# Patient Record
Sex: Female | Born: 1965 | Race: Black or African American | Hispanic: No | Smoking: Current every day smoker
Health system: Southern US, Community
[De-identification: ages and names within clinical notes are randomized; demographics above are authoritative.]

## PROBLEM LIST (undated history)

## (undated) DIAGNOSIS — I639 Cerebral infarction, unspecified: Secondary | ICD-10-CM

## (undated) DIAGNOSIS — E785 Hyperlipidemia, unspecified: Secondary | ICD-10-CM

## (undated) DIAGNOSIS — R112 Nausea with vomiting, unspecified: Secondary | ICD-10-CM

## (undated) DIAGNOSIS — Z720 Tobacco use: Secondary | ICD-10-CM

## (undated) DIAGNOSIS — M47812 Spondylosis without myelopathy or radiculopathy, cervical region: Secondary | ICD-10-CM

## (undated) DIAGNOSIS — Z860100 Personal history of colon polyps, unspecified: Secondary | ICD-10-CM

## (undated) DIAGNOSIS — G629 Polyneuropathy, unspecified: Secondary | ICD-10-CM

## (undated) DIAGNOSIS — E876 Hypokalemia: Secondary | ICD-10-CM

## (undated) DIAGNOSIS — D332 Benign neoplasm of brain, unspecified: Secondary | ICD-10-CM

## (undated) DIAGNOSIS — C259 Malignant neoplasm of pancreas, unspecified: Secondary | ICD-10-CM

## (undated) DIAGNOSIS — M545 Low back pain, unspecified: Secondary | ICD-10-CM

## (undated) DIAGNOSIS — Z9889 Other specified postprocedural states: Secondary | ICD-10-CM

## (undated) DIAGNOSIS — N643 Galactorrhea not associated with childbirth: Secondary | ICD-10-CM

## (undated) DIAGNOSIS — I10 Essential (primary) hypertension: Secondary | ICD-10-CM

## (undated) DIAGNOSIS — G8929 Other chronic pain: Secondary | ICD-10-CM

## (undated) DIAGNOSIS — G709 Myoneural disorder, unspecified: Secondary | ICD-10-CM

## (undated) DIAGNOSIS — K8020 Calculus of gallbladder without cholecystitis without obstruction: Secondary | ICD-10-CM

## (undated) DIAGNOSIS — E669 Obesity, unspecified: Secondary | ICD-10-CM

## (undated) DIAGNOSIS — Z8489 Family history of other specified conditions: Secondary | ICD-10-CM

## (undated) HISTORY — DX: Hypokalemia: E87.6

## (undated) HISTORY — DX: Myoneural disorder, unspecified: G70.9

## (undated) HISTORY — DX: Benign neoplasm of brain, unspecified: D33.2

## (undated) HISTORY — DX: Hyperlipidemia, unspecified: E78.5

## (undated) HISTORY — DX: Cerebral infarction, unspecified: I63.9

## (undated) HISTORY — DX: Spondylosis without myelopathy or radiculopathy, cervical region: M47.812

## (undated) HISTORY — DX: Personal history of colonic polyps: Z86.010

## (undated) HISTORY — DX: Obesity, unspecified: E66.9

## (undated) HISTORY — DX: Malignant neoplasm of pancreas, unspecified: C25.9

## (undated) HISTORY — DX: Tobacco use: Z72.0

## (undated) HISTORY — PX: COLONOSCOPY W/ BIOPSIES AND POLYPECTOMY: SHX1376

## (undated) HISTORY — PX: APPENDECTOMY: SHX54

## (undated) HISTORY — DX: Personal history of colon polyps, unspecified: Z86.0100

## (undated) HISTORY — DX: Galactorrhea not associated with childbirth: N64.3

## (undated) HISTORY — PX: SHOULDER OPEN ROTATOR CUFF REPAIR: SHX2407

## (undated) HISTORY — PX: BRAIN BIOPSY: SHX905

## (undated) HISTORY — DX: Polyneuropathy, unspecified: G62.9

---

## 1998-12-09 ENCOUNTER — Other Ambulatory Visit: Admission: RE | Admit: 1998-12-09 | Discharge: 1998-12-09 | Payer: Self-pay | Admitting: Obstetrics & Gynecology

## 1998-12-28 ENCOUNTER — Ambulatory Visit (HOSPITAL_COMMUNITY): Admission: RE | Admit: 1998-12-28 | Discharge: 1998-12-28 | Payer: Self-pay | Admitting: Obstetrics & Gynecology

## 1999-02-21 ENCOUNTER — Encounter: Payer: Self-pay | Admitting: Obstetrics & Gynecology

## 1999-02-21 ENCOUNTER — Ambulatory Visit (HOSPITAL_COMMUNITY): Admission: RE | Admit: 1999-02-21 | Discharge: 1999-02-21 | Payer: Self-pay | Admitting: Psychology

## 1999-04-02 ENCOUNTER — Inpatient Hospital Stay (HOSPITAL_COMMUNITY): Admission: AD | Admit: 1999-04-02 | Discharge: 1999-04-02 | Payer: Self-pay | Admitting: Obstetrics and Gynecology

## 1999-04-04 ENCOUNTER — Ambulatory Visit (HOSPITAL_COMMUNITY): Admission: RE | Admit: 1999-04-04 | Discharge: 1999-04-04 | Payer: Self-pay | Admitting: Obstetrics & Gynecology

## 1999-04-04 ENCOUNTER — Encounter: Payer: Self-pay | Admitting: Obstetrics & Gynecology

## 1999-04-12 ENCOUNTER — Inpatient Hospital Stay (HOSPITAL_COMMUNITY): Admission: AD | Admit: 1999-04-12 | Discharge: 1999-04-19 | Payer: Self-pay | Admitting: *Deleted

## 1999-04-12 ENCOUNTER — Encounter: Payer: Self-pay | Admitting: *Deleted

## 1999-04-14 ENCOUNTER — Encounter: Payer: Self-pay | Admitting: Obstetrics & Gynecology

## 1999-04-17 ENCOUNTER — Encounter: Payer: Self-pay | Admitting: Obstetrics & Gynecology

## 1999-04-18 ENCOUNTER — Encounter: Payer: Self-pay | Admitting: Obstetrics and Gynecology

## 1999-04-20 ENCOUNTER — Encounter (HOSPITAL_COMMUNITY): Admission: RE | Admit: 1999-04-20 | Discharge: 1999-07-19 | Payer: Self-pay | Admitting: Obstetrics and Gynecology

## 1999-10-27 ENCOUNTER — Encounter: Admission: RE | Admit: 1999-10-27 | Discharge: 1999-10-27 | Payer: Self-pay | Admitting: Internal Medicine

## 2000-01-29 ENCOUNTER — Encounter: Payer: Self-pay | Admitting: Emergency Medicine

## 2000-01-29 ENCOUNTER — Emergency Department (HOSPITAL_COMMUNITY): Admission: EM | Admit: 2000-01-29 | Discharge: 2000-01-29 | Payer: Self-pay | Admitting: Emergency Medicine

## 2000-05-17 ENCOUNTER — Encounter: Payer: Self-pay | Admitting: Internal Medicine

## 2000-05-17 ENCOUNTER — Ambulatory Visit (HOSPITAL_COMMUNITY): Admission: RE | Admit: 2000-05-17 | Discharge: 2000-05-17 | Payer: Self-pay | Admitting: Internal Medicine

## 2000-05-17 ENCOUNTER — Encounter: Admission: RE | Admit: 2000-05-17 | Discharge: 2000-05-17 | Payer: Self-pay | Admitting: Internal Medicine

## 2000-05-31 ENCOUNTER — Encounter: Admission: RE | Admit: 2000-05-31 | Discharge: 2000-05-31 | Payer: Self-pay | Admitting: Internal Medicine

## 2000-06-02 ENCOUNTER — Ambulatory Visit (HOSPITAL_COMMUNITY): Admission: RE | Admit: 2000-06-02 | Discharge: 2000-06-02 | Payer: Self-pay | Admitting: Hematology and Oncology

## 2000-06-02 ENCOUNTER — Encounter: Payer: Self-pay | Admitting: Hematology and Oncology

## 2001-05-11 ENCOUNTER — Emergency Department (HOSPITAL_COMMUNITY): Admission: EM | Admit: 2001-05-11 | Discharge: 2001-05-11 | Payer: Self-pay | Admitting: Emergency Medicine

## 2001-11-15 ENCOUNTER — Emergency Department (HOSPITAL_COMMUNITY): Admission: EM | Admit: 2001-11-15 | Discharge: 2001-11-15 | Payer: Self-pay | Admitting: Emergency Medicine

## 2003-04-20 ENCOUNTER — Emergency Department (HOSPITAL_COMMUNITY): Admission: EM | Admit: 2003-04-20 | Discharge: 2003-04-20 | Payer: Self-pay | Admitting: *Deleted

## 2005-05-13 ENCOUNTER — Emergency Department (HOSPITAL_COMMUNITY): Admission: EM | Admit: 2005-05-13 | Discharge: 2005-05-13 | Payer: Self-pay | Admitting: Family Medicine

## 2006-09-16 ENCOUNTER — Ambulatory Visit: Payer: Self-pay | Admitting: Internal Medicine

## 2006-09-24 ENCOUNTER — Ambulatory Visit: Payer: Self-pay | Admitting: Internal Medicine

## 2006-09-26 ENCOUNTER — Ambulatory Visit (HOSPITAL_COMMUNITY): Admission: RE | Admit: 2006-09-26 | Discharge: 2006-09-26 | Payer: Self-pay | Admitting: Internal Medicine

## 2006-09-27 ENCOUNTER — Emergency Department (HOSPITAL_COMMUNITY): Admission: EM | Admit: 2006-09-27 | Discharge: 2006-09-27 | Payer: Self-pay | Admitting: Emergency Medicine

## 2006-09-29 ENCOUNTER — Emergency Department (HOSPITAL_COMMUNITY): Admission: EM | Admit: 2006-09-29 | Discharge: 2006-09-29 | Payer: Self-pay | Admitting: Emergency Medicine

## 2006-10-04 DIAGNOSIS — G43909 Migraine, unspecified, not intractable, without status migrainosus: Secondary | ICD-10-CM

## 2006-10-07 DIAGNOSIS — Z720 Tobacco use: Secondary | ICD-10-CM | POA: Insufficient documentation

## 2006-10-07 DIAGNOSIS — F17200 Nicotine dependence, unspecified, uncomplicated: Secondary | ICD-10-CM

## 2006-10-08 ENCOUNTER — Ambulatory Visit: Payer: Self-pay | Admitting: Internal Medicine

## 2006-11-06 ENCOUNTER — Ambulatory Visit (HOSPITAL_BASED_OUTPATIENT_CLINIC_OR_DEPARTMENT_OTHER): Admission: RE | Admit: 2006-11-06 | Discharge: 2006-11-06 | Payer: Self-pay | Admitting: Internal Medicine

## 2006-11-10 ENCOUNTER — Encounter (INDEPENDENT_AMBULATORY_CARE_PROVIDER_SITE_OTHER): Payer: Self-pay | Admitting: Internal Medicine

## 2006-11-10 ENCOUNTER — Ambulatory Visit: Payer: Self-pay | Admitting: Internal Medicine

## 2006-12-22 ENCOUNTER — Emergency Department (HOSPITAL_COMMUNITY): Admission: EM | Admit: 2006-12-22 | Discharge: 2006-12-22 | Payer: Self-pay | Admitting: Emergency Medicine

## 2007-01-03 ENCOUNTER — Ambulatory Visit: Payer: Self-pay | Admitting: Internal Medicine

## 2007-01-03 DIAGNOSIS — S335XXA Sprain of ligaments of lumbar spine, initial encounter: Secondary | ICD-10-CM

## 2007-01-14 ENCOUNTER — Telehealth (INDEPENDENT_AMBULATORY_CARE_PROVIDER_SITE_OTHER): Payer: Self-pay | Admitting: *Deleted

## 2007-01-15 ENCOUNTER — Encounter (INDEPENDENT_AMBULATORY_CARE_PROVIDER_SITE_OTHER): Payer: Self-pay | Admitting: *Deleted

## 2007-01-15 ENCOUNTER — Ambulatory Visit: Payer: Self-pay | Admitting: Sports Medicine

## 2007-01-15 DIAGNOSIS — E669 Obesity, unspecified: Secondary | ICD-10-CM

## 2007-03-26 ENCOUNTER — Telehealth: Payer: Self-pay | Admitting: *Deleted

## 2007-03-26 ENCOUNTER — Encounter (INDEPENDENT_AMBULATORY_CARE_PROVIDER_SITE_OTHER): Payer: Self-pay | Admitting: Internal Medicine

## 2007-03-26 ENCOUNTER — Ambulatory Visit: Payer: Self-pay | Admitting: Hospitalist

## 2007-05-06 ENCOUNTER — Telehealth: Payer: Self-pay | Admitting: *Deleted

## 2007-05-22 ENCOUNTER — Telehealth: Payer: Self-pay | Admitting: *Deleted

## 2007-05-22 ENCOUNTER — Ambulatory Visit: Payer: Self-pay | Admitting: Hospitalist

## 2007-05-22 ENCOUNTER — Encounter (INDEPENDENT_AMBULATORY_CARE_PROVIDER_SITE_OTHER): Payer: Self-pay | Admitting: *Deleted

## 2007-05-24 ENCOUNTER — Encounter (INDEPENDENT_AMBULATORY_CARE_PROVIDER_SITE_OTHER): Payer: Self-pay | Admitting: Internal Medicine

## 2007-05-24 ENCOUNTER — Ambulatory Visit (HOSPITAL_COMMUNITY): Admission: RE | Admit: 2007-05-24 | Discharge: 2007-05-24 | Payer: Self-pay | Admitting: Internal Medicine

## 2007-05-28 ENCOUNTER — Telehealth: Payer: Self-pay | Admitting: *Deleted

## 2007-06-02 ENCOUNTER — Encounter (INDEPENDENT_AMBULATORY_CARE_PROVIDER_SITE_OTHER): Payer: Self-pay | Admitting: Internal Medicine

## 2007-06-02 ENCOUNTER — Encounter: Admission: RE | Admit: 2007-06-02 | Discharge: 2007-08-01 | Payer: Self-pay | Admitting: *Deleted

## 2007-07-10 ENCOUNTER — Ambulatory Visit: Payer: Self-pay | Admitting: Internal Medicine

## 2007-07-10 DIAGNOSIS — M545 Low back pain, unspecified: Secondary | ICD-10-CM

## 2007-07-10 HISTORY — DX: Low back pain, unspecified: M54.50

## 2007-07-10 LAB — CONVERTED CEMR LAB
ALT: 10 units/L (ref 0–35)
Anti Nuclear Antibody(ANA): NEGATIVE
Bilirubin Urine: NEGATIVE
CO2: 25 meq/L (ref 19–32)
Calcium: 9.6 mg/dL (ref 8.4–10.5)
Chloride: 104 meq/L (ref 96–112)
Creatinine, Ser: 1.1 mg/dL (ref 0.40–1.20)
Eosinophils Relative: 1 % (ref 0–5)
Glucose, Bld: 93 mg/dL (ref 70–99)
HCT: 45 % (ref 36.0–46.0)
Lymphocytes Relative: 36 % (ref 12–46)
Lymphs Abs: 3.8 10*3/uL — ABNORMAL HIGH (ref 0.7–3.3)
MCV: 91.1 fL (ref 78.0–100.0)
Monocytes Relative: 5 % (ref 3–11)
Platelets: 256 10*3/uL (ref 150–400)
Protein, ur: NEGATIVE mg/dL
RBC: 4.94 M/uL (ref 3.87–5.11)
Rhuematoid fact SerPl-aCnc: <20 intl units/mL (ref 0–20)
Sed Rate: 2 mm/hr (ref 0–22)
Sodium: 138 meq/L (ref 135–145)
Total Protein: 7.2 g/dL (ref 6.0–8.3)
Urobilinogen, UA: 1 (ref 0.0–1.0)
WBC: 10.5 10*3/uL (ref 4.0–10.5)

## 2007-07-11 ENCOUNTER — Encounter: Payer: Self-pay | Admitting: Internal Medicine

## 2007-07-18 ENCOUNTER — Telehealth: Payer: Self-pay | Admitting: *Deleted

## 2007-07-18 ENCOUNTER — Encounter: Payer: Self-pay | Admitting: Internal Medicine

## 2007-07-23 ENCOUNTER — Ambulatory Visit: Payer: Self-pay | Admitting: *Deleted

## 2007-07-23 ENCOUNTER — Encounter (INDEPENDENT_AMBULATORY_CARE_PROVIDER_SITE_OTHER): Payer: Self-pay | Admitting: Internal Medicine

## 2007-07-31 ENCOUNTER — Ambulatory Visit: Payer: Self-pay | Admitting: Hospitalist

## 2007-08-12 ENCOUNTER — Telehealth: Payer: Self-pay | Admitting: *Deleted

## 2007-08-13 ENCOUNTER — Ambulatory Visit: Payer: Self-pay | Admitting: Hospitalist

## 2007-08-15 ENCOUNTER — Ambulatory Visit (HOSPITAL_COMMUNITY): Admission: RE | Admit: 2007-08-15 | Discharge: 2007-08-15 | Payer: Self-pay | Admitting: *Deleted

## 2007-08-16 ENCOUNTER — Emergency Department (HOSPITAL_COMMUNITY): Admission: EM | Admit: 2007-08-16 | Discharge: 2007-08-16 | Payer: Self-pay | Admitting: Emergency Medicine

## 2007-08-25 ENCOUNTER — Encounter (INDEPENDENT_AMBULATORY_CARE_PROVIDER_SITE_OTHER): Payer: Self-pay | Admitting: Internal Medicine

## 2007-08-27 ENCOUNTER — Ambulatory Visit: Payer: Self-pay | Admitting: Internal Medicine

## 2007-09-04 ENCOUNTER — Telehealth (INDEPENDENT_AMBULATORY_CARE_PROVIDER_SITE_OTHER): Payer: Self-pay | Admitting: *Deleted

## 2007-09-04 ENCOUNTER — Telehealth: Payer: Self-pay | Admitting: *Deleted

## 2007-09-09 ENCOUNTER — Encounter: Payer: Self-pay | Admitting: Internal Medicine

## 2007-09-09 ENCOUNTER — Ambulatory Visit: Payer: Self-pay | Admitting: Internal Medicine

## 2007-09-18 DIAGNOSIS — N643 Galactorrhea not associated with childbirth: Secondary | ICD-10-CM

## 2007-09-18 HISTORY — DX: Galactorrhea not associated with childbirth: N64.3

## 2007-09-18 HISTORY — PX: COLONOSCOPY W/ BIOPSIES AND POLYPECTOMY: SHX1376

## 2007-09-30 ENCOUNTER — Ambulatory Visit: Payer: Self-pay | Admitting: Internal Medicine

## 2007-09-30 ENCOUNTER — Encounter: Payer: Self-pay | Admitting: Internal Medicine

## 2007-09-30 ENCOUNTER — Encounter (INDEPENDENT_AMBULATORY_CARE_PROVIDER_SITE_OTHER): Payer: Self-pay | Admitting: Internal Medicine

## 2007-09-30 ENCOUNTER — Encounter (INDEPENDENT_AMBULATORY_CARE_PROVIDER_SITE_OTHER): Payer: Self-pay | Admitting: *Deleted

## 2007-10-21 ENCOUNTER — Encounter (INDEPENDENT_AMBULATORY_CARE_PROVIDER_SITE_OTHER): Payer: Self-pay | Admitting: *Deleted

## 2007-10-21 ENCOUNTER — Ambulatory Visit: Payer: Self-pay

## 2007-10-21 LAB — CONVERTED CEMR LAB
Prolactin: 7.7 ng/mL
TSH: 2.176 microintl units/mL (ref 0.350–5.50)

## 2007-10-27 ENCOUNTER — Encounter: Admission: RE | Admit: 2007-10-27 | Discharge: 2007-10-27 | Payer: Self-pay | Admitting: *Deleted

## 2007-10-28 ENCOUNTER — Encounter (INDEPENDENT_AMBULATORY_CARE_PROVIDER_SITE_OTHER): Payer: Self-pay | Admitting: Internal Medicine

## 2007-11-02 ENCOUNTER — Encounter: Admission: RE | Admit: 2007-11-02 | Discharge: 2007-11-02 | Payer: Self-pay | Admitting: Neurosurgery

## 2007-12-02 ENCOUNTER — Encounter (INDEPENDENT_AMBULATORY_CARE_PROVIDER_SITE_OTHER): Payer: Self-pay | Admitting: Internal Medicine

## 2007-12-16 ENCOUNTER — Encounter: Admission: RE | Admit: 2007-12-16 | Discharge: 2007-12-16 | Payer: Self-pay | Admitting: Surgery

## 2007-12-17 HISTORY — PX: BREAST SURGERY: SHX581

## 2007-12-18 ENCOUNTER — Ambulatory Visit (HOSPITAL_BASED_OUTPATIENT_CLINIC_OR_DEPARTMENT_OTHER): Admission: RE | Admit: 2007-12-18 | Discharge: 2007-12-18 | Payer: Self-pay | Admitting: Surgery

## 2007-12-18 ENCOUNTER — Encounter (INDEPENDENT_AMBULATORY_CARE_PROVIDER_SITE_OTHER): Payer: Self-pay | Admitting: Surgery

## 2007-12-31 ENCOUNTER — Telehealth (INDEPENDENT_AMBULATORY_CARE_PROVIDER_SITE_OTHER): Payer: Self-pay | Admitting: *Deleted

## 2008-01-05 ENCOUNTER — Ambulatory Visit: Payer: Self-pay | Admitting: Internal Medicine

## 2008-01-05 ENCOUNTER — Encounter (INDEPENDENT_AMBULATORY_CARE_PROVIDER_SITE_OTHER): Payer: Self-pay | Admitting: Internal Medicine

## 2008-01-05 DIAGNOSIS — R0609 Other forms of dyspnea: Secondary | ICD-10-CM

## 2008-01-05 DIAGNOSIS — R0989 Other specified symptoms and signs involving the circulatory and respiratory systems: Secondary | ICD-10-CM

## 2008-01-15 ENCOUNTER — Encounter: Payer: Self-pay | Admitting: Infectious Disease

## 2008-02-16 ENCOUNTER — Telehealth (INDEPENDENT_AMBULATORY_CARE_PROVIDER_SITE_OTHER): Payer: Self-pay | Admitting: Internal Medicine

## 2008-02-17 ENCOUNTER — Ambulatory Visit: Payer: Self-pay | Admitting: Internal Medicine

## 2008-02-20 ENCOUNTER — Encounter: Admission: RE | Admit: 2008-02-20 | Discharge: 2008-02-20 | Payer: Self-pay | Admitting: Internal Medicine

## 2008-03-02 ENCOUNTER — Encounter: Admission: RE | Admit: 2008-03-02 | Discharge: 2008-03-02 | Payer: Self-pay | Admitting: Internal Medicine

## 2008-04-16 ENCOUNTER — Telehealth (INDEPENDENT_AMBULATORY_CARE_PROVIDER_SITE_OTHER): Payer: Self-pay | Admitting: Internal Medicine

## 2008-04-17 HISTORY — PX: NASAL SEPTOPLASTY W/ TURBINOPLASTY: SHX2070

## 2008-05-11 ENCOUNTER — Ambulatory Visit (HOSPITAL_BASED_OUTPATIENT_CLINIC_OR_DEPARTMENT_OTHER): Admission: RE | Admit: 2008-05-11 | Discharge: 2008-05-11 | Payer: Self-pay | Admitting: Otolaryngology

## 2008-06-09 ENCOUNTER — Telehealth (INDEPENDENT_AMBULATORY_CARE_PROVIDER_SITE_OTHER): Payer: Self-pay | Admitting: Internal Medicine

## 2008-06-15 ENCOUNTER — Encounter (INDEPENDENT_AMBULATORY_CARE_PROVIDER_SITE_OTHER): Payer: Self-pay | Admitting: Internal Medicine

## 2008-06-15 ENCOUNTER — Ambulatory Visit: Payer: Self-pay | Admitting: Internal Medicine

## 2008-06-17 ENCOUNTER — Telehealth (INDEPENDENT_AMBULATORY_CARE_PROVIDER_SITE_OTHER): Payer: Self-pay | Admitting: Internal Medicine

## 2008-07-05 ENCOUNTER — Encounter (INDEPENDENT_AMBULATORY_CARE_PROVIDER_SITE_OTHER): Payer: Self-pay | Admitting: Internal Medicine

## 2008-07-05 ENCOUNTER — Ambulatory Visit: Payer: Self-pay | Admitting: Internal Medicine

## 2008-07-07 LAB — CONVERTED CEMR LAB
Free T4: 1.04 ng/dL (ref 0.89–1.80)
TSH: 1.503 microintl units/mL (ref 0.350–4.50)

## 2008-07-14 ENCOUNTER — Ambulatory Visit (HOSPITAL_COMMUNITY): Admission: RE | Admit: 2008-07-14 | Discharge: 2008-07-14 | Payer: Self-pay | Admitting: Internal Medicine

## 2008-07-19 ENCOUNTER — Telehealth (INDEPENDENT_AMBULATORY_CARE_PROVIDER_SITE_OTHER): Payer: Self-pay | Admitting: Internal Medicine

## 2008-08-06 ENCOUNTER — Ambulatory Visit: Payer: Self-pay | Admitting: Infectious Diseases

## 2008-09-28 ENCOUNTER — Ambulatory Visit: Payer: Self-pay | Admitting: Internal Medicine

## 2008-12-02 ENCOUNTER — Encounter: Admission: RE | Admit: 2008-12-02 | Discharge: 2008-12-02 | Payer: Self-pay | Admitting: Internal Medicine

## 2008-12-02 ENCOUNTER — Ambulatory Visit: Payer: Self-pay | Admitting: Internal Medicine

## 2008-12-02 LAB — HM MAMMOGRAPHY

## 2008-12-03 ENCOUNTER — Encounter (INDEPENDENT_AMBULATORY_CARE_PROVIDER_SITE_OTHER): Payer: Self-pay | Admitting: Internal Medicine

## 2008-12-04 LAB — CONVERTED CEMR LAB
ALT: 9 units/L (ref 0–35)
AST: 10 units/L (ref 0–37)
Alkaline Phosphatase: 69 units/L (ref 39–117)
Glucose, Bld: 95 mg/dL (ref 70–99)
LDL Cholesterol: 57 mg/dL (ref 0–99)
Sodium: 144 meq/L (ref 135–145)
Total Bilirubin: 0.4 mg/dL (ref 0.3–1.2)
Total Protein: 6.6 g/dL (ref 6.0–8.3)
Triglycerides: 177 mg/dL — ABNORMAL HIGH (ref <150)
VLDL: 35 mg/dL (ref 0–40)

## 2009-07-30 ENCOUNTER — Emergency Department (HOSPITAL_COMMUNITY): Admission: EM | Admit: 2009-07-30 | Discharge: 2009-07-30 | Payer: Self-pay | Admitting: Emergency Medicine

## 2009-08-01 ENCOUNTER — Telehealth (INDEPENDENT_AMBULATORY_CARE_PROVIDER_SITE_OTHER): Payer: Self-pay | Admitting: Internal Medicine

## 2009-08-04 ENCOUNTER — Ambulatory Visit: Payer: Self-pay | Admitting: Internal Medicine

## 2009-08-04 ENCOUNTER — Ambulatory Visit (HOSPITAL_COMMUNITY): Admission: RE | Admit: 2009-08-04 | Discharge: 2009-08-04 | Payer: Self-pay | Admitting: Internal Medicine

## 2009-08-04 DIAGNOSIS — R519 Headache, unspecified: Secondary | ICD-10-CM | POA: Insufficient documentation

## 2009-08-04 DIAGNOSIS — R51 Headache: Secondary | ICD-10-CM

## 2010-01-09 ENCOUNTER — Telehealth (INDEPENDENT_AMBULATORY_CARE_PROVIDER_SITE_OTHER): Payer: Self-pay | Admitting: Internal Medicine

## 2010-07-28 ENCOUNTER — Ambulatory Visit: Payer: Self-pay | Admitting: Internal Medicine

## 2010-07-28 DIAGNOSIS — G479 Sleep disorder, unspecified: Secondary | ICD-10-CM

## 2010-07-28 DIAGNOSIS — G589 Mononeuropathy, unspecified: Secondary | ICD-10-CM | POA: Insufficient documentation

## 2010-07-28 DIAGNOSIS — M79609 Pain in unspecified limb: Secondary | ICD-10-CM

## 2010-07-28 HISTORY — DX: Mononeuropathy, unspecified: G58.9

## 2010-07-28 LAB — CONVERTED CEMR LAB
BUN: 13 mg/dL (ref 6–23)
Chloride: 105 meq/L (ref 96–112)
Hemoglobin: 14.1 g/dL (ref 12.0–15.0)
Potassium: 3.8 meq/L (ref 3.5–5.3)
RDW: 14.5 % (ref 11.5–15.5)
Vitamin B-12: 559 pg/mL (ref 211–911)

## 2010-08-04 ENCOUNTER — Ambulatory Visit: Payer: Self-pay | Admitting: Internal Medicine

## 2010-08-04 DIAGNOSIS — M5030 Other cervical disc degeneration, unspecified cervical region: Secondary | ICD-10-CM

## 2010-08-22 ENCOUNTER — Ambulatory Visit: Payer: Self-pay | Admitting: Sports Medicine

## 2010-08-22 ENCOUNTER — Encounter: Payer: Self-pay | Admitting: Family Medicine

## 2010-08-23 ENCOUNTER — Encounter: Payer: Self-pay | Admitting: Family Medicine

## 2010-09-04 ENCOUNTER — Encounter: Admission: RE | Admit: 2010-09-04 | Payer: Self-pay | Source: Home / Self Care | Admitting: Family Medicine

## 2010-09-19 ENCOUNTER — Ambulatory Visit: Admit: 2010-09-19 | Payer: Self-pay | Admitting: Sports Medicine

## 2010-10-08 ENCOUNTER — Encounter: Payer: Self-pay | Admitting: *Deleted

## 2010-10-08 ENCOUNTER — Encounter: Payer: Self-pay | Admitting: Internal Medicine
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
## 2010-10-17 NOTE — Assessment & Plan Note (Signed)
Summary: back pain/gg   Vital Signs:  Patient profile:   45 year old female Height:      67 inches (170.18 cm) Weight:      228.0 pounds (103.64 kg) BMI:     35.84 Temp:     97.2 degrees F (36.22 degrees C) oral Pulse rate:   82 / minute BP sitting:   130 / 80  (left arm)  Vitals Entered By: Cynda Familia Duncan Dull) (July 28, 2010 1:34 PM) CC: left arm pain "tingling" x 1.5wks Is Patient Diabetic? No Pain Assessment Patient in pain? yes     Location: left arm Intensity: 10 Type: throbbing Onset of pain  Constant Nutritional Status BMI of > 30 = obese  Have you ever been in a relationship where you felt threatened, hurt or afraid?No   Does patient need assistance? Functional Status Self care Ambulation Normal   Primary Care Provider:  Deatra Robinson MD  CC:  left arm pain "tingling" x 1.5wks.  History of Present Illness: Pt is a 45 yo female w/ past medical history of  Chronic back pain-         1. Neg MRI of hips bilaterally         2.  Neg CT scan of A and P         3. MRI of T and L spine: Very mild deg changes from T4-5 and T7-8, cervical spondylotic changes at  C5-6(pain is not in this location), very ild disc buldge at L2-3 without spinal stenosis or nerve   compression or foraminal narrowing, mildly face deg changes at L4-5 and L5-S1-nospinal stenosis,           foraminal narrowing or nerve root compression.          4. Nl ESR, ANA, RF, HIV, TSH, and B12 Lower extremity weakness R sided galactorrhea w/ benign papilloma excised in 04/09 L sided galactorrhea 5/09 Internal hemorrhoids and adenomatous polyps 01/09-scope by Dr.Gessner  Pt complaints of left arm pain x 1.5 weeks that has progressively worsened.  Pain began at rest and patient denies any falls or recent trauma.  SHe describes as 10/10, throbbing pain with no worsening factors or alleviating factors including heating pads, ice packs, tylenol, flexeril, tramadol or neurtontin.  She notes a tingling  sensation but denies numbess or weakness in grip.  Pt had similar symptoms in her right arm 2/2 rotator cuff tear in 2004; has had this repaired.   Symptoms not alleviated with tramadol; pt reports she has been nauseated and has vomited 3 times since onset of pain;last episode was yesterday.  Reports emesis was non-bloody and non-bilious. Pt has hx of DJD and spinal stenosis; followed with neurosurgery for steroid injections into low back.    Pt denies fevers or chills.    Preventive Screening-Counseling & Management  Alcohol-Tobacco     Smoking Status: current     Smoking Cessation Counseling: yes     Packs/Day: 3/4 ppd     Year Started: 1988  Current Medications (verified): 1)  Neurontin 300 Mg  Caps (Gabapentin) .... Take One Tablet By Mouth Three Times A Day. 2)  Flexeril 10 Mg Tabs (Cyclobenzaprine Hcl) .... Take One Tab By Mouth Every 8 Hours As Needed For Pain. 3)  Hydrocodone-Acetaminophen 7.5-500 Mg Tabs (Hydrocodone-Acetaminophen) .... Take One Pill Every 4 Hours As Needed For Pain 4)  Meloxicam 7.5 Mg Tabs (Meloxicam) .... Take One Tab By Mouth Two Times A Day For 14 Days.  Take  With Food. 5)  Amitriptyline Hcl 50 Mg Tabs (Amitriptyline Hcl) .... Take 1 Tab By Mouth At Bedtime  Allergies: 1)  ! Iodine 2)  * Contrast Dye  Past History:  Past medical, surgical, family and social histories (including risk factors) reviewed, and no changes noted (except as noted below).  Past Medical History: Reviewed history from 09/28/2008 and no changes required. Chronic back pain-         1. Neg MRI of hips bilaterally         2.  Neg CT scan of A and P         3. MRI of T and L spine: Very mild deg changes from T4-5 and T7-8, cervical spondylotic changes at                      C5-6(pain is not in this location), very ild disc buldge at L2-3 without spinal stenosis or nerve                       compression or foraminal narrowing, mildly face deg changes at L4-5 and L5-S1-nospinal  stenosis,                    foraminal narrowing or nerve root compression.          4. Nl ESR, ANA, RF, HIV, TSH, and B12 Lower extremity weakness R sided galactorrhea w/ benign papilloma excised in 04/09 L sided galactorrhea 5/09 Internal hemorrhoids and adenomatous polyps 01/09-scope by Dr.Gessner  Past Surgical History: Reviewed history from 09/28/2008 and no changes required. Appendectomy Right shoulder reconstruction 2005 after hurting it moving heavy objects s/p R breast central duct excision 4/09 Septoplasty w/ bilateral inferior turbinate reducation by Dr. Ezzard Standing in 8/09  Family History: Reviewed history from 09/09/2007 and no changes required. CHF mom Breast Cancer: aunt Diabetes: mom, aunt Liver cancer aunt  Social History: Reviewed history from 08/06/2008 and no changes required. 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   Head:  atraumatic.normocephalic.   Eyes:  anicteric, PERRL, EOMI. sclerae anicteric.  conjuncitvae without pallor or injection Mouth:  MMM, no exudates or erythema.pharynx pink and moist.   Neck:  No LAD, FROM, paraspinal muscles TTP.no masses.   Lungs:  Normal respiratory effort, chest expands symmetrically. Lungs are clear to auscultation, no crackles or wheezes. Heart:  Normal rate and regular rhythm. S1 and S2 normal without gallop, murmur, click, rub or other extra sounds. Abdomen:  +BS's, soft, obese, NT and ND. Msk:  normal ROM and no joint swelling.  no joint warmth, no redness over joints, no joint deformities, no joint instability, and no crepitation.   Grip strength +5 and intact bilaterally.  + 5 muscle strength globally.  +2 DTRs globally.  Sensation mildly decreased in left fingers and hand.  Tinel test neg.     Tingling sensation provoked/worsened with passive abduction/extension of left arm.   Capillary refill intact in all extremities. Pulses:  pulses equal and intact globally. Extremities:  no edema.   Neurologic:  alert & oriented X3, cranial nerves II-XII intact, strength normal in all extremities, gait normal, and DTRs symmetrical and normal.   Skin:  turgor normal, color normal, and no rashes.   Psych:  Oriented X3,  memory intact for recent and remote, normally interactive, good eye contact, and not anxious appearing.     Impression & Recommendations:  Problem # 1:  NEUROPATHY (ICD-355.9) Pt has known DJD with cervical spondylotic changes at  C5-6 on prior MRI.  Her symptoms are somewhat consistent with nerve compression, likely in the C-spine.  Pt is without fever, chills, or other signs/sx/hx concerning for infection process.  WIll first treat conservatively with NSAIDs and vicodin for pain control while pursuing serological w/u with BMET, CBC, TSH and B12.  Pt needs to complete paperwork for orange card.  WIll not pursue any imaging at this time.  Will bring pt back in 7-10days to assess symptoms and f/u lab work.  If her symptoms worsen, she develops fever/chills, numbness,or weakness will obtain MRI with contrast for further evaluation.  Advised pt to call the clinic or to go to the ER if she develops any of the symptoms described, or any other concerning problems.  Encourage her to complete paperwork with Rudell Cobb as this will facilitate obtaining imaging studies.    Will also provide rx for amitriptyline to help with any neuropathic component as well as pts difficulty sleeping.    T-Basic Metabolic Panel [phone]) T-CBC No Diff ([id number]) T-TSH ([id number]) T-Vitamin B12 ([id number])  Problem # 2:  ARM PAIN, LEFT (ICD-729.5) Please refer to plan outlined in #1.  Also advised pt to apply ice pack and/or warm compress for additional symptom relief. Orders: T-Basic Metabolic  Panel ([id number]) T-CBC No Diff ([id number]) T-TSH ([id number])  Problem # 3:  UNSPECIFIED SLEEP DISTURBANCE (ICD-780.50) Pt reports difficulty sleeping.  Discussed sleep hygiene measures.  Will write rx for amitriptyline to help improve pts ability to sleep as well as to help tx any neuropathic component of pts pain.    Complete Medication List: 1)  Neurontin 300 Mg Caps (Gabapentin) .... Take one tablet by mouth three times a day. 2)  Flexeril 10 Mg Tabs (Cyclobenzaprine hcl) .... Take one tab by mouth every 8 hours as needed for pain. 3)  Hydrocodone-acetaminophen 7.5-500 Mg Tabs (Hydrocodone-acetaminophen) .... Take one pill every 4 hours as needed for pain 4)  Meloxicam 7.5 Mg Tabs (Meloxicam) .... Take one tab by mouth two times a day for 14 days.  take with food. 5)  Amitriptyline Hcl 50 Mg Tabs (Amitriptyline hcl) .... Take 1 tab by mouth at bedtime  Patient Instructions: 1)  Please schedule a follow-up appointment in 1-2 weeks. 2)   Keep active but avoid activities that are painful. Apply moist heat and/or ice to your neck and shoulder several times a day. 3)  Meloxicam is a medicine to help with inflammation and pain.  Use as directed and be sure to take with food. 4)  Vicodin is a medication for pain.  Use as directed. 5)  If your pain becomes worse, you develop any weakness or numbness in your left arm or hand, or you develop any other concerning symptoms, call the clinic at [phone] or go to the ER. 6)  Be sure to complete your paperwork with Rudell Cobb. This will make it easier to pursue any imaging we might need if your pain continues. 7)  I will call you if any of your lab work is abnormal. Prescriptions: AMITRIPTYLINE HCL 50 MG TABS (AMITRIPTYLINE HCL) Take 1 tab by mouth at bedtime  #30 x 0   Entered and Authorized by:   Nelda Bucks DO   Signed by:   Nelda Bucks DO on 07/28/2010  Method used:   Print then Give to Patient   RxID:   [id number] MELOXICAM  7.5 MG TABS (MELOXICAM) Take one tab by mouth two times a day for 14 days.  Take with food.  #28 x 0   Entered and Authorized by:   Nelda Bucks DO   Signed by:   Nelda Bucks DO on 07/28/2010   Method used:   Print then Give to Patient   RxID:   [id number] HYDROCODONE-ACETAMINOPHEN 7.5-500 MG TABS (HYDROCODONE-ACETAMINOPHEN) Take one pill every 4 hours as needed for pain  #90 x 0   Entered and Authorized by:   Nelda Bucks DO   Signed by:   Nelda Bucks DO on 07/28/2010   Method used:   Print then Give to Patient   RxID:   [id number]    Orders Added: 1)  T-Basic Metabolic Panel [[id number]] 2)  T-CBC No Diff [[id number]] 3)  T-TSH [[id number]] 4)  T-Vitamin B12 [[id number]] 5)  Est. Patient Level III [62376]   Process Orders Check Orders Results:     [facility]: Order checked:     Nelda Bucks DO NOT AUTHORIZED TO ORDER Tests Sent for requisitioning (August 07, 2010 4:12 PM):     07/28/2010: [facility] -- T-Basic Metabolic Panel [phone] (signed)     07/28/2010: [facility] -- T-CBC No Diff [[id number]] (signed)     07/28/2010: [facility] -- T-TSH [phone] (signed)     07/28/2010: [facility] -- T-Vitamin B12 [[id number]] (signed)     Prevention & Chronic Care Immunizations   Influenza vaccine: Fluvax 3+  (06/15/2008)    Tetanus booster: Not documented    Pneumococcal vaccine: Not documented  Other Screening   Pap smear: Not documented    Mammogram: ASSESSMENT: Negative - BI-RADS 1^MM DIGITAL SCREENING  (12/02/2008)   Mammogram action/deferral: RIGHT BREAST DUCTOGRAM:   Right breast ductogram was described to the patient. Alternatives and complications discussed, questions answered and verbal consent granted.  The nipple was cleansed with alcohol. Sialogram  catheter was placed into the duct where clear nipple discharge could be expressed.  Contrast  material was instilled. Mammographic images demonstrate a filling defect present within a duct concerning for a papilloma.  Surgical excision is recommended. Findings were directly discussed with the patient and called to Dr. Lorel Monaco by our nurse, Sonnie Alamo.   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  Surgical excision is recommended.   The patient is scheduled to see Dr. Luisa Hart on 11/17/07.   ASSESSMENT: Suspicious - BI-RADS 4  (10/27/2007)   Smoking status: current  (07/28/2010)   Smoking cessation counseling: yes  (07/28/2010)  Lipids   Total Cholesterol: 128  (12/03/2008)   LDL: 57  (12/03/2008)   LDL Direct: Not documented   HDL: 36  (12/03/2008)   Triglycerides: 177  (12/03/2008)

## 2010-10-17 NOTE — Letter (Signed)
Summary: *Consult Note  [facility]  [address]   [place], [place] [address]   Phone: [phone]  Fax: [phone]    Re:    Kathleen Stone DOB:    07/28/1966   Dear Dr. Threasa Beards:    Thank you for requesting that we see the above patient for consultation.  A copy of the detailed office note will be sent under separate cover, for your review.  Evaluation today is consistent with:  1) DEGENERATIVE DISC DISEASE, CERVICAL SPINE (ICD-722.4) 2) ARM PAIN, LEFT (ICD-729.5)   Our recommendation is for: We feel Kathleen Stone's symptoms are coming from her neck and not her shoulder.  We increased her Neurontin to 300mg  three times a day.  We can consider increasing this in the future as long as she is not too sedated.  We also referred her to physical therapy.  She will follow-up with [place] in 4-6 weeks.  New Orders include:  1)  Consultation Level III [13086]  New Medications started today include: None  After today's visit, the patients current medications include: 1)  NEURONTIN 300 MG  CAPS (GABAPENTIN) Take one tablet by mouth three times a day. 2)  FLEXERIL 10 MG TABS (CYCLOBENZAPRINE HCL) Take one tab by mouth every 8 hours as needed for pain. 3)  HYDROCODONE-ACETAMINOPHEN 7.5-500 MG TABS (HYDROCODONE-ACETAMINOPHEN) Take one pill every 4 hours as needed for pain 4)  MELOXICAM 7.5 MG TABS (MELOXICAM) Take one tab by mouth two times a day for 14 days.  Take with food. 5)  AMITRIPTYLINE HCL 50 MG TABS (AMITRIPTYLINE HCL) Take 1 tab by mouth at bedtime  Thank you for this consultation.  If you have any further questions regarding the care of this patient, please do not hesitate to contact me @ [phone].  Thank you for this opportunity to look after your patient.  Sincerely,   Darene Lamer DO [facility]

## 2010-10-17 NOTE — Progress Notes (Signed)
Summary: Refill/gh  Phone Note Refill Request Message from:  Patient on January 09, 2010 10:50 AM  Refills Requested: Medication #1:  NEURONTIN 300 MG  CAPS Take one tablet by mouth three times a day.  Medication #2:  FLEXERIL 10 MG TABS Take one tab by mouth every 8 hours as needed for pain..  Method Requested: Electronic Initial call taken by: Angelina Ok RN,  January 09, 2010 10:50 AM    Prescriptions: FLEXERIL 10 MG TABS (CYCLOBENZAPRINE HCL) Take one tab by mouth every 8 hours as needed for pain.  #24 x 0   Entered and Authorized by:   Joaquin Courts  MD   Signed by:   Joaquin Courts  MD on 01/09/2010   Method used:   Electronically to        [facility] [phone]* (retail)       [address]       [place], [place]  [address]       Ph: [phone]       Fax: [phone]   RxID:   [id number] NEURONTIN 300 MG  CAPS (GABAPENTIN) Take one tablet by mouth three times a day.  #90 x 3   Entered and Authorized by:   Joaquin Courts  MD   Signed by:   Joaquin Courts  MD on 01/09/2010   Method used:   Electronically to        [facility] [phone]* (retail)       [address]       [place], [place]  [address]       Ph: [phone]       Fax: [phone]   RxID:   [id number]

## 2010-10-17 NOTE — Assessment & Plan Note (Signed)
Summary: Follow up   Vital Signs:  Patient profile:   45 year old female Height:      67 inches (170.18 cm) Weight:      230.2 pounds (104.64 kg) BMI:     36.18 Temp:     97.1 degrees F (36.17 degrees C) oral Pulse rate:   73 / minute BP sitting:   144 / 79  (left arm) Cuff size:   regular  Vitals Entered By: Theotis Barrio NT II (August 04, 2010 4:13 PM) CC: FOLLOW UP APPT ON LEFT ARM Is Patient Diabetic? No Pain Assessment Patient in pain? yes     Location: LEFT ARM Intensity:       6 Type:  SHOOTING/TINGLING Onset of pain  FOR ABOUT 4 WEEKS Nutritional Status BMI of > 30 = obese  Have you ever been in a relationship where you felt threatened, hurt or afraid?No   Does patient need assistance? Functional Status Self care Ambulation Normal Comments FOLLOW UP APPT ON LEFT ARM / SHOOTING PAIN AND TINGLING FOR ABOUT 4 WEEKS   Primary Care Provider:  Deatra Robinson MD  CC:  FOLLOW UP APPT ON LEFT ARM.  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 It has been about 3 weeks. She has hx of DJD  in lumbar and thoracic and cervical spine with mild foramen narrowing and spinal stenosis. She had steroids injection intoher low back which seems not helpful. A week ago she went to [facility] and was given vicodin, flexeril, amitriptyline and mobic. She took them for 3 days, seems slightly better and still has the pain and tingling with weakness in her left arm and could not sleep on left side. She has no injury or fall. Current smoker 1 PPD, no other c/o including CP, SOB or abdominal pain.   Preventive Screening-Counseling & Management  Alcohol-Tobacco     Smoking Status: current     Smoking Cessation Counseling: yes     Packs/Day: 3/4 ppd     Year Started: 1988  Caffeine-Diet-Exercise     Does Patient Exercise: no  Problems  Prior to Update: 1)  Headache  (ICD-784.0) 2)  Preventive Health Care  (ICD-V70.0) 3)  Snoring  (ICD-786.09) 4)  Rash-nonvesicular  (ICD-782.1) 5)  Low Back Pain Syndrome  (ICD-724.2) 6)  Obesity Nos  (ICD-278.00) 7)  Lumbar Strain  (ICD-847.2) 8)  Migraine Headache  (ICD-346.90) 9)  Tobacco Abuse  (ICD-305.1)  Medications Prior to Update: 1)  Neurontin 300 Mg  Caps (Gabapentin) .... Take One Tablet By Mouth Three Times A Day. 2)  Flexeril 10 Mg Tabs (Cyclobenzaprine Hcl) .... Take One Tab By Mouth Every 8 Hours As Needed For Pain. 3)  Hydrocodone-Acetaminophen 7.5-500 Mg Tabs (Hydrocodone-Acetaminophen) .... Take One Pill Every 4 Hours As Needed For Pain 4)  Meloxicam 7.5 Mg Tabs (Meloxicam) .... Take One Tab By Mouth Two Times A Day For 14 Days.  Take With Food. 5)  Amitriptyline Hcl 50 Mg Tabs (Amitriptyline Hcl) .... Take 1 Tab By Mouth At Bedtime  Current Medications (verified): 1)  Neurontin 300 Mg  Caps (Gabapentin) .... Take One Tablet By Mouth Three Times A Day. 2)  Flexeril 10 Mg Tabs (Cyclobenzaprine Hcl) .... Take One Tab By Mouth Every 8 Hours As Needed For Pain. 3)  Hydrocodone-Acetaminophen 7.5-500 Mg Tabs (Hydrocodone-Acetaminophen) .Marland KitchenMarland KitchenMarland Kitchen  Take One Pill Every 4 Hours As Needed For Pain 4)  Meloxicam 7.5 Mg Tabs (Meloxicam) .... Take One Tab By Mouth Two Times A Day For 14 Days.  Take With Food. 5)  Amitriptyline Hcl 50 Mg Tabs (Amitriptyline Hcl) .... Take 1 Tab By Mouth At Bedtime  Allergies (verified): 1)  ! Iodine 2)  * Contrast Dye  Past History:  Past Medical History: Last updated: 09/28/2008 Chronic back pain-         1. Neg MRI of hips bilaterally         2.  Neg CT scan of A and P         3. MRI of T and L spine: Very mild deg changes from T4-5 and T7-8, cervical spondylotic changes at                      C5-6(pain is not in this location), very ild disc buldge at L2-3 without spinal stenosis or nerve                       compression or foraminal narrowing,  mildly face deg changes at L4-5 and L5-S1-nospinal stenosis,                    foraminal narrowing or nerve root compression.          4. Nl ESR, ANA, RF, HIV, TSH, and B12 Lower extremity weakness R sided galactorrhea w/ benign papilloma excised in 04/09 L sided galactorrhea 5/09 Internal hemorrhoids and adenomatous polyps 01/09-scope by Dr.Gessner  Past Surgical History: Last updated: 09/28/2008 Appendectomy Right shoulder reconstruction 2005 after hurting it moving heavy objects s/p R breast central duct excision 4/09 Septoplasty w/ bilateral inferior turbinate reducation by Dr. Ezzard Standing in 8/09  Family History: Last updated: 09/09/2007 CHF mom Breast Cancer: aunt Diabetes: mom, aunt Liver cancer aunt  Social History: Last updated: 08/06/2008 Occupation: [employer] part time Domestic Partner: female has emphysema Smoked 2 packs/day for 20+ years, down to 1/2ppd now, willing to try to quit  Alcohol use-no Drug use-no Regular exercise-no  Risk Factors: Smoking Status: current (08/04/2010) Packs/Day: 3/4 ppd (08/04/2010)  Family History: Reviewed history from 09/09/2007 and no changes required. CHF mom Breast Cancer: aunt Diabetes: mom, aunt Liver cancer aunt  Social History: Reviewed history from 08/06/2008 and no changes required. 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  The patient denies anorexia, fever, vision loss, chest pain, syncope, dyspnea on exertion, peripheral edema, prolonged cough, headaches, hemoptysis, abdominal pain, melena, and hematochezia.    Physical Exam  General:  alert, well-developed, well-nourished, well-hydrated, and overweight-appearing.   Head:  normocephalic.   Nose:  no nasal discharge.   Mouth:  pharynx pink and  moist.   Neck:  supple.   Lungs:  normal respiratory effort, normal breath sounds, no crackles, and no wheezes.   Heart:  normal rate, regular rhythm, no murmur, and no JVD.   Abdomen:  soft, non-tender, and normal bowel sounds.   Msk:  normal ROM, no joint swelling, no joint warmth, no redness over joints, and no joint deformities.  Left shoulder mild tenderness. Pulses:  2+ Extremities:  No edema.  Neurologic:  alert & oriented X3, gait normal,  and DTRs symmetrical and normal.  Left arm MS 4/5, right 5/5. Sensation is lightly dull in left side.   Impression & Recommendations:  Problem # 1:  DEGENERATIVE DISC DISEASE, CERVICAL SPINE (ICD-722.4) Assessment Unchanged Her left arm pain with tingling sensation and slightly decrease MS and sensation is likely due to cervical nerve impingement from her DJD. But rotator cuff tear/tendonitis is also possible. Since she just recently started meds for pain, will continue them and advised to use heating/icy pad. Will have sports medicine referral for possible steroid injection vs PT.  Orders: Sports Medicine (Sports Med)  Problem # 2:  TOBACCO ABUSE (ICD-305.1) Assessment: Comment Only CURRENT SMOKER. Encouraged smoking cessation and discussed different methods for smoking cessation.   Complete Medication List: 1)  Neurontin 300 Mg Caps (Gabapentin) .... Take one tablet by mouth three times a day. 2)  Flexeril 10 Mg Tabs (Cyclobenzaprine hcl) .... Take one tab by mouth every 8 hours as needed for pain. 3)  Hydrocodone-acetaminophen 7.5-500 Mg Tabs (Hydrocodone-acetaminophen) .... Take one pill every 4 hours as needed for pain 4)  Meloxicam 7.5 Mg Tabs (Meloxicam) .... Take one tab by mouth two times a day for 14 days.  take with food. 5)  Amitriptyline Hcl 50 Mg Tabs (Amitriptyline hcl) .... Take 1 tab by mouth at bedtime  Other Orders: Mammogram (Screening) (Mammo) Influenza Vaccine NON MCR (16109)  Patient Instructions: 1)  Please  schedule a follow-up appointment in 1 month. 2)  Will call you once the sports medicine referral is made.  3)  Tobacco is very bad for your health and your loved ones! You Should stop smoking!. 4)  Stop Smoking Tips: Choose a Quit date. Cut down before the Quit date. decide what you will do as a substitute when you feel the urge to smoke(gum,toothpick,exercise). 5)  It is important that you exercise regularly at least 20 minutes 5 times a week. If you develop chest pain, have severe difficulty breathing, or feel very tired , stop exercising immediately and seek medical attention. 6)  You need to lose weight. Consider a lower calorie diet and regular exercise.    Orders Added: 1)  Mammogram (Screening) [Mammo] 2)  Sports Medicine [Sports Med] 3)  Influenza Vaccine NON MCR [00028] 4)  Est. Patient Level III [60454]   Immunizations Administered:  Influenza Vaccine # 1:    Vaccine Type: Fluvax Non-MCR    Site: right deltoid    Mfr: GlaxoSmithKline    Dose: 0.5 ml    Route: IM    Given by: Marin Roberts RN    Exp. Date: 03/17/2011    Lot #: [id number]    VIS given: 04/11/10 version given August 04, 2010.    Physician counseled: yes  Flu Vaccine Consent Questions:    Do you have a history of severe allergic reactions to this vaccine? no    Any prior history of allergic reactions to egg and/or gelatin? no    Do you have a sensitivity to the preservative Thimersol? no    Do you have a past history of Guillan-Barre Syndrome? no    Do you currently have an acute febrile illness? no    Have you ever had a severe reaction to latex? no    Vaccine information given and explained to patient? yes    Are you currently pregnant? no   Immunizations Administered:  Influenza Vaccine # 1:    Vaccine Type: Fluvax Non-MCR    Site: right deltoid    Mfr:  GlaxoSmithKline    Dose: 0.5 ml    Route: IM    Given by: Marin Roberts RN    Exp. Date: 03/17/2011    Lot #: [id number]    VIS given:  04/11/10 version given August 04, 2010.    Physician counseled: yes   Prevention & Chronic Care Immunizations   Influenza vaccine: Fluvax Non-MCR  (08/04/2010)    Tetanus booster: Not documented    Pneumococcal vaccine: Not documented  Other Screening   Pap smear: Not documented   Pap smear action/deferral: Deferred  (08/04/2010)    Mammogram: ASSESSMENT: Negative - BI-RADS 1^MM DIGITAL SCREENING  (12/02/2008)   Mammogram action/deferral: Ordered  (08/04/2010)   Smoking status: current  (08/04/2010)   Smoking cessation counseling: yes  (08/04/2010)  Lipids   Total Cholesterol: 128  (12/03/2008)   LDL: 57  (12/03/2008)   LDL Direct: Not documented   HDL: 36  (12/03/2008)   Triglycerides: 177  (12/03/2008)   Nursing Instructions: Give Flu vaccine today Schedule screening mammogram (see order)

## 2010-10-17 NOTE — Letter (Signed)
Summary: [facility] referral  [facility] referral   Imported By: Marily Memos 08/22/2010 16:21:18  _____________________________________________________________________  External Attachment:    Type:   Image     Comment:   External Document

## 2011-01-15 ENCOUNTER — Encounter: Payer: Self-pay | Admitting: Internal Medicine

## 2011-01-23 ENCOUNTER — Encounter: Payer: Self-pay | Admitting: Internal Medicine

## 2011-01-23 ENCOUNTER — Ambulatory Visit (INDEPENDENT_AMBULATORY_CARE_PROVIDER_SITE_OTHER): Payer: Self-pay | Admitting: Internal Medicine

## 2011-01-23 VITALS — BP 128/74 | HR 95 | Temp 97.7°F | Ht 67.0 in | Wt 229.1 lb

## 2011-01-23 DIAGNOSIS — K047 Periapical abscess without sinus: Secondary | ICD-10-CM

## 2011-01-23 MED ORDER — HYDROCODONE-ACETAMINOPHEN 7.5-500 MG PO TABS: 1.0000 | ORAL_TABLET | ORAL | Status: DC | PRN

## 2011-01-23 MED ORDER — AMOXICILLIN 875 MG PO TABS: 875.0000 mg | ORAL_TABLET | Freq: Two times a day (BID) | ORAL | Status: AC

## 2011-01-23 NOTE — Progress Notes (Signed)
  Subjective:    Patient ID: Kathleen Stone, female    DOB: 04-Mar-1966, 45 y.o.   MRN: [id number]  HPI Presents with a 2 week history of tooth ache #32. Reports that "its broken" with a pain 10/10 in intensity without any radiculopathy. Denies any discharge, fever, chills, swelling. Patient is uninsured and does not see dentist on a regular basis.    Review of Systems  Constitutional: Negative.   HENT: Negative for hearing loss, ear pain, nosebleeds, congestion, facial swelling, rhinorrhea, sneezing, neck pain, neck stiffness, postnasal drip, tinnitus and ear discharge.   Eyes: Negative.   Respiratory: Negative.   Cardiovascular: Negative.   Gastrointestinal: Negative.   Genitourinary: Negative.   Neurological: Negative.   Hematological: Negative.   Psychiatric/Behavioral: Negative.         Objective:   Physical Exam  HENT:  Mouth/Throat: Oropharynx is clear and moist and mucous membranes are normal. Mucous membranes are not dry and not cyanotic. She does not have dentures. No oral lesions. Dental abscesses and dental caries present. No lacerations.     General: Vital signs reviewed and noted. Well-developed, well-nourished, in no acute distress; alert, appropriate and cooperative throughout examination.  Head: Normocephalic, atraumatic.  Neck: No deformities, masses, or tenderness noted.  Lungs:  Normal respiratory effort. Clear to auscultation BL without crackles or wheezes.  Heart: RRR. S1 and S2 normal without gallop, murmur, or rubs.  Abdomen:  BS normoactive. Soft, Nondistended, non-tender.  No masses or organomegaly.  Extremities: No pretibial edema.                                   Assessment & Plan:

## 2011-01-23 NOTE — Patient Instructions (Signed)
Please, take all your medications as prescribed. Please, see the dentist as soon as possible. Please, call with any questions. Do not drive while taking pain medication for your tooth ache.

## 2011-01-30 NOTE — Op Note (Signed)
NAME:  Kathleen Stone, Kathleen Stone              ACCOUNT NO.:  [id number]   MEDICAL RECORD NO.:  [id number]          PATIENT TYPE:  AMB   LOCATION:  DSC                          FACILITY:  [facility]   PHYSICIAN:  Christopher E. 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   POSTOPERATIVE DIAGNOSES:  Septal deformity with turbinate hypertrophy  and chronic nasal obstruction.   OPERATION PERFORMED:  Septoplasty with bilateral inferior turbinate  reductions.   SURGEON:  Kristine Garbe. Ezzard Standing, MD   ANESTHESIA:  General endotracheal.   COMPLICATIONS:  None.   CLINICAL NOTE:  Kathleen Stone is a 45 year old female who has had  chronic trouble breathing through her nose as well as heavy snoring at  night.  She has had a sleep test, which did not demonstrate any  significant obstructive sleep apnea.  On exam of the nose, she has  septal deformity to the left and large turbinates bilaterally.  She is  taken to the operating room at this time for septoplasty and bilateral  inferior turbinate reductions.   DESCRIPTION OF PROCEDURE:  After adequate endotracheal anesthesia, the  patient received 1 g of Ancef IV preoperatively.  Nose was prepped and  draped out with sterile towels.  The nose was then further prepped with  cotton pledgets soaked in Afrin for decongestant, and the septum  turbinates were injected with Xylocaine with epinephrine.  A  hemitransfixion incision was made along the caudal edge of the septum on  the right side.  Mucoperichondrial and mucoperiosteal flaps were  elevated posteriorly.  Approximately 2 cm back, the cartilaginous septum  was transected vertically and mucoperichondrial and mucoperiosteal flaps  were elevated along the left side back toward the bony region where the  deformity  occurred.  After elevating flaps on either side of the  mucoperiosteum of the bony deformity, the bony deformity was removed in  addition to some of the thickened bony vomer superiorly.  This allowed  septum return more to midline.  Next, inferior turbinate reduction was  performed on the right side.  Incision was made along the anterior  inferior edge of the turbinate and mucosal flaps were elevated off of  the turbinate bone and turbinate bone was removed and submucosal  cauterization of the turbinate was performed and then the remaining  turbinate tissue was outfractured.  On the left side, an incision was  made along the inferior edge of the turbinate and turbinate bone.  Mucosa was elevated off of the medial aspect of the turbinate bone and  then the turbinate bone, and the lateral turbinate mucosa was amputated.  Suction cautery was used for hemostasis, and the remaining turbinate  tissue was outfractured.  This completed the procedure.  The  hemitransfixion incision was closed with a 4-0 chromic suture.  The  septum was basted with a 4-0 chromic suture, and Telfa pack was placed  on either side of the nose soaked in bacitracin ointment.  Kathleen Stone was  awakened from anesthesia and transferred to recovery room.  Postop,  doing well.   DISPOSITION:  Kathleen Stone was discharged home later this morning on Keflex  500 mg b.i.d. for 1 week, Tylenol and Vicodin 1-2 q.6 h. p.r.n. pain,  however, followup in my office tomorrow to have her nasal packs removed.           ______________________________  Kristine Garbe Ezzard Standing, M.D.     CEN/MEDQ  D:  05/11/2008  T:  05/11/2008  Job:  [id number]   cc:   Acey Lav, MD

## 2011-01-30 NOTE — Op Note (Signed)
NAME:  Kathleen Stone, Kathleen Stone              ACCOUNT NO.:  [id number]   MEDICAL RECORD NO.:  [id number]          PATIENT TYPE:  AMB   LOCATION:  DSC                          FACILITY:  [facility]   PHYSICIAN:  Thomas A. Cornett, M.D.DATE OF BIRTH:  10-09-1965   DATE OF PROCEDURE:  12/18/2007  DATE OF DISCHARGE:                               OPERATIVE REPORT   PREOPERATIVE DIAGNOSIS:  Right breast bloody nipple discharge.   POSTOPERATIVE DIAGNOSIS:  Right breast bloody nipple discharge.   PROCEDURE:  Right breast central duct excision.   SURGEON:  Maisie Fus A. Cornett, M.D.   ANESTHESIA:  LMA with 0.25% Sensorcaine local.   ESTIMATED BLOOD LOSS:  20 mL.   SPECIMEN:  Right breast central duct region to pathology.   INDICATIONS FOR PROCEDURE:  The patient is a 45 year old female who has  had bloody drainage from her right breast.  Ductogram revealed a filling  defect just below the nipple going toward the patient's right lateral  and right upper outer quadrants.  These were felt to be papillomas and  excision was recommended.  She presents today for that.  Informed  consent was obtained.   DESCRIPTION OF PROCEDURE:  The patient was brought to the operating room  and placed supine.  After induction of LMA anesthesia, the right breast  was prepped and draped in a sterile fashion.  A curvilinear incision was  made along the border of the nipple areolar complex from about 6 to 12  o'clock laterally.  I then elevated the nipple and excised the central  ductal tissue just below the skin of the nipple.  This was taken about 2  cm deep.  This also isolated the area where the filling defect was noted  in the right outer lateral portion of the ductal system.  This tissue  was excised and sent to pathology for further evaluation.  Hemostasis  was excellent.  The wound was closed in layers with a deep layer of 3-0  Vicryl and 4-0 Monocryl was used in a subcuticular fashion.  Dermabond  was applied as a  dressing.  All final counts of sponge, needle, and  instruments were found to be correct.  The patient was awakened and  taken to recovery in satisfactory condition.      Thomas A. Cornett, M.D.  Electronically Signed     TAC/MEDQ  D:  12/18/2007  T:  12/18/2007  Job:  [id number]   cc:   Dellia Beckwith, M.D.

## 2011-02-02 NOTE — Procedures (Signed)
NAME:  Kathleen Stone, Kathleen Stone              ACCOUNT NO.:  [id number]   MEDICAL RECORD NO.:  [id number]          PATIENT TYPE:  OUT   LOCATION:  SLEEP CENTER                 FACILITY:  [facility]   PHYSICIAN:  Clinton D. Maple Hudson, MD, FCCP, FACPDATE OF BIRTH:  05[birth year]   DATE OF STUDY:                            NOCTURNAL POLYSOMNOGRAM   REFERRING PHYSICIAN:  Dennis Bast, MD   INDICATION FOR STUDY:  Hypersomnia with sleep apnea.   EPWORTH SLEEPINESS SCORE:  5/24, BMI 34.9, weight 210 pounds.   HOME MEDICATIONS:  Listed as Benadryl Allergy and Sinus Headache.   SLEEP ARCHITECTURE:  Total sleep time 404 minutes with sleep efficiency  90%.  Stage I was 6%, stage II 74%, stages III and IV 5%, REM 15% of  total sleep time.  Sleep latency 2 minutes, REM latency 168 minutes.  Awake after sleep onset 44 minutes.  Arousal index 11.6.  No bedtime  medication was taken.   RESPIRATORY DATA:  Apnea/hypopnea index (AHI, RDI) 0.6 obstructive  events per hour which is within normal limits (normal range 0 to 5 per  hour).  There were 2 obstructive apneas and 2 hypopneas.  These events  were recorded while sleeping on her sides.  REM AHI 1.9 per hour.  There  were insufficient numbers of obstructive events to qualify for CPAP  titration by split protocol on this study night.   OXYGEN DATA:  Moderate to loud snoring with oxygen desaturation to a  nadir of 90%.  Mean oxygen saturation through the study was 95% on room  air.   CARDIAC DATA:  Normal sinus rhythm.   MOVEMENT-PARASOMNIA:  A total of 37 limb jerks were recorded of which 32  were associated with arousal or awakening for a periodic limb movement  with arousal index of 4.8 per hour which is mildly increased but of  uncertain significance.  No bathroom trips.  Bruxism was noted.   IMPRESSIONS-RECOMMENDATIONS:  1. Occasional sleep disordered breathing events, apnea/hypopnea index      0.6 per hour, normal range 0 to 5 per hour.  Events were  associated      with sleeping on sides, not while supine.  Moderate to loud snoring      with oxygen desaturation to a nadir of 90%.  2. CPAP would not be indicated for scores in this range, but the loud      snoring suggests that there may be correctable anatomic obstruction      in the nasopharynx.  3. 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      If this appears to be a significant cause of sleep disturbance at      home, then a therapeutic trial of Requip or Mirapex might be      considered.      Clinton D. Maple Hudson, MD, [facility], FACP  Diplomate, Biomedical engineer of Sleep Medicine  Electronically Signed     CDY/MEDQ  D:  11/10/2006 10:01:22  T:  11/10/2006 17:46:13  Job:  [id number]

## 2011-03-23 ENCOUNTER — Other Ambulatory Visit: Payer: Self-pay | Admitting: *Deleted

## 2011-03-23 DIAGNOSIS — K047 Periapical abscess without sinus: Secondary | ICD-10-CM

## 2011-03-23 NOTE — Telephone Encounter (Signed)
Pain  Tooth continiues. Has not been able to get a Dental appointment.

## 2011-03-24 MED ORDER — HYDROCODONE-ACETAMINOPHEN 7.5-500 MG PO TABS: 1.0000 | ORAL_TABLET | ORAL | Status: DC | PRN

## 2011-03-26 NOTE — Telephone Encounter (Signed)
Prescription for Lortab # 30 called to the [facility] on [address].  Pt was notified of.

## 2011-04-30 ENCOUNTER — Ambulatory Visit (INDEPENDENT_AMBULATORY_CARE_PROVIDER_SITE_OTHER): Payer: Self-pay | Admitting: Internal Medicine

## 2011-04-30 ENCOUNTER — Other Ambulatory Visit: Payer: Self-pay | Admitting: *Deleted

## 2011-04-30 ENCOUNTER — Encounter: Payer: Self-pay | Admitting: Internal Medicine

## 2011-04-30 VITALS — BP 126/77 | HR 77 | Temp 97.6°F | Ht 67.0 in | Wt 224.2 lb

## 2011-04-30 DIAGNOSIS — R252 Cramp and spasm: Secondary | ICD-10-CM | POA: Insufficient documentation

## 2011-04-30 DIAGNOSIS — I10 Essential (primary) hypertension: Secondary | ICD-10-CM

## 2011-04-30 DIAGNOSIS — G589 Mononeuropathy, unspecified: Secondary | ICD-10-CM

## 2011-04-30 DIAGNOSIS — G629 Polyneuropathy, unspecified: Secondary | ICD-10-CM

## 2011-04-30 DIAGNOSIS — G4762 Sleep related leg cramps: Secondary | ICD-10-CM

## 2011-04-30 DIAGNOSIS — K047 Periapical abscess without sinus: Secondary | ICD-10-CM

## 2011-04-30 LAB — FERRITIN: Ferritin: 39 ng/mL (ref 10–291)

## 2011-04-30 MED ORDER — AMITRIPTYLINE HCL 50 MG PO TABS: 50.0000 mg | ORAL_TABLET | Freq: Every day | ORAL | Status: DC

## 2011-04-30 MED ORDER — GABAPENTIN 300 MG PO CAPS: 300.0000 mg | ORAL_CAPSULE | Freq: Three times a day (TID) | ORAL | Status: DC

## 2011-04-30 MED ORDER — CYCLOBENZAPRINE HCL 10 MG PO TABS: 10.0000 mg | ORAL_TABLET | Freq: Three times a day (TID) | ORAL | Status: DC | PRN

## 2011-04-30 NOTE — Patient Instructions (Signed)
Please schedule a follow up appointment with your PCP in 1-2 months. Please take the medicines as prescribed. I will call you with your lab results if they will be abnormal.

## 2011-04-30 NOTE — Assessment & Plan Note (Signed)
Her prescriptions for Neurontin and amitriptyline were refilled.

## 2011-04-30 NOTE — Progress Notes (Signed)
  Subjective:    Patient ID: Kathleen Stone, female    DOB: Sep 23, 1965, 45 y.o.   MRN: [id number]  HPI: 45 year old woman with past medical history significant for neuropathy, tobacco abuse comes to the clinic for leg cramps.  She states that she has been having cramps in her left leg for the last 3 weeks. They usually happen at night and wakes her from sleep but can sometimes happen during the day. They are very painful and rates her pain 10 out of 10. They get relieved by rubbing.  She also reports some associated numbness with it. Denies any fatigue, weight loss, trauma or any systemic complaints including fever, chills, nausea or vomiting or diarrhea or dysuria.      Review of Systems  Constitutional: Negative for fever, chills, appetite change and fatigue.  HENT: Negative for nosebleeds, congestion, rhinorrhea, sneezing, postnasal drip and tinnitus.   Eyes: Negative for photophobia and visual disturbance.  Respiratory: Negative for apnea, cough, choking, chest tightness, shortness of breath and stridor.   Cardiovascular: Negative for chest pain, palpitations and leg swelling.  Gastrointestinal: Negative for nausea, vomiting, abdominal pain and blood in stool.  Genitourinary: Negative for dysuria, hematuria and difficulty urinating.  Musculoskeletal: Negative for arthralgias.  Neurological: Negative for dizziness and headaches.  Hematological: Negative for adenopathy.       Objective:   Physical Exam  Constitutional: She is oriented to person, place, and time. She appears well-developed and well-nourished. No distress.  HENT:  Head: Normocephalic and atraumatic.  Right Ear: External ear normal.  Left Ear: External ear normal.  Mouth/Throat: No oropharyngeal exudate.  Eyes: Conjunctivae and EOM are normal. Pupils are equal, round, and reactive to light. Right eye exhibits no discharge. Left eye exhibits no discharge.  Neck: Normal range of motion. Neck supple. No JVD present. No  tracheal deviation present. No thyromegaly present.  Cardiovascular: Normal rate, regular rhythm and intact distal pulses.  Exam reveals no gallop and no friction rub.   No murmur heard. Pulmonary/Chest: Effort normal and breath sounds normal. No stridor. No respiratory distress. She has no wheezes. She has no rales. She exhibits no tenderness.  Abdominal: Soft. Bowel sounds are normal. She exhibits no distension and no mass. There is no tenderness. There is no rebound and no guarding.  Musculoskeletal: Normal range of motion. She exhibits no edema and no tenderness.  Lymphadenopathy:    She has no cervical adenopathy.  Neurological: She is alert and oriented to person, place, and time. She has normal reflexes. She displays normal reflexes. No cranial nerve deficit. She exhibits normal muscle tone. Coordination normal.  Skin: Skin is warm. She is not diaphoretic.          Assessment & Plan:

## 2011-04-30 NOTE — Assessment & Plan Note (Signed)
These are most likely nocturnal leg cramps. Other differential include restless leg syndrome.Possible etiologies include neuropathy versus myopathy versus idiopathic versus electrolyte disturbances versus hypothyroidism. Her last TSH was reviewed which was within normal limits. We'll check BMP (potassium) , magnesium and ferritin levels to rule out any reversible causes.

## 2011-05-01 LAB — BASIC METABOLIC PANEL WITHOUT GFR
BUN: 9 mg/dL (ref 6–23)
CO2: 26 meq/L (ref 19–32)
Calcium: 9.5 mg/dL (ref 8.4–10.5)
Chloride: 105 meq/L (ref 96–112)
Creat: 0.84 mg/dL (ref 0.50–1.10)
GFR, Est African American: >60 mL/min
GFR, Est Non African American: >60 mL/min
Glucose, Bld: 82 mg/dL (ref 70–99)
Potassium: 3.8 meq/L (ref 3.5–5.3)
Sodium: 141 meq/L (ref 135–145)

## 2011-05-16 ENCOUNTER — Other Ambulatory Visit: Payer: Self-pay | Admitting: *Deleted

## 2011-05-16 DIAGNOSIS — K047 Periapical abscess without sinus: Secondary | ICD-10-CM

## 2011-05-16 MED ORDER — HYDROCODONE-ACETAMINOPHEN 7.5-500 MG PO TABS: 1.0000 | ORAL_TABLET | ORAL | Status: DC | PRN

## 2011-05-16 NOTE — Telephone Encounter (Signed)
Call from pt said that she has not been to the Dentist.  Is having tooth pain.  Would like to get a refill on her pain medication if possible.

## 2011-05-18 NOTE — Telephone Encounter (Signed)
Refill for Lortab called to [facility].

## 2011-06-11 LAB — BASIC METABOLIC PANEL
Calcium: 9.5
Chloride: 103
Creatinine, Ser: 0.78
GFR calc Af Amer: >60
Sodium: 138

## 2011-06-11 LAB — DIFFERENTIAL
Lymphs Abs: 3.2
Monocytes Relative: 3
Neutro Abs: 8.5 — ABNORMAL HIGH
Neutrophils Relative %: 70

## 2011-06-11 LAB — CBC
MCV: 88.9
RBC: 4.6
WBC: 12.1 — ABNORMAL HIGH

## 2011-06-15 ENCOUNTER — Other Ambulatory Visit: Payer: Self-pay | Admitting: *Deleted

## 2011-06-15 DIAGNOSIS — K047 Periapical abscess without sinus: Secondary | ICD-10-CM

## 2011-06-16 ENCOUNTER — Other Ambulatory Visit: Payer: Self-pay | Admitting: Internal Medicine

## 2011-06-16 DIAGNOSIS — M545 Low back pain: Secondary | ICD-10-CM

## 2011-06-18 MED ORDER — HYDROCODONE-ACETAMINOPHEN 7.5-500 MG PO TABS: 1.0000 | ORAL_TABLET | ORAL | Status: DC | PRN

## 2011-06-18 NOTE — Telephone Encounter (Signed)
Lortab rx faxed to [facility].

## 2011-06-18 NOTE — Telephone Encounter (Signed)
Refill for Lortab called to [facility] per pt request.

## 2011-06-21 ENCOUNTER — Ambulatory Visit (HOSPITAL_COMMUNITY)
Admission: RE | Admit: 2011-06-21 | Discharge: 2011-06-21 | Disposition: A | Discharge status: Home / Self Care | Payer: Self-pay | Source: Ambulatory Visit | Attending: Internal Medicine | Admitting: Internal Medicine

## 2011-06-21 ENCOUNTER — Ambulatory Visit (INDEPENDENT_AMBULATORY_CARE_PROVIDER_SITE_OTHER): Payer: Self-pay | Admitting: Internal Medicine

## 2011-06-21 ENCOUNTER — Encounter: Payer: Self-pay | Admitting: Internal Medicine

## 2011-06-21 DIAGNOSIS — M549 Dorsalgia, unspecified: Secondary | ICD-10-CM | POA: Insufficient documentation

## 2011-06-21 DIAGNOSIS — R071 Chest pain on breathing: Secondary | ICD-10-CM | POA: Insufficient documentation

## 2011-06-21 DIAGNOSIS — Z0000 Encounter for general adult medical examination without abnormal findings: Secondary | ICD-10-CM

## 2011-06-21 DIAGNOSIS — R079 Chest pain, unspecified: Secondary | ICD-10-CM

## 2011-06-21 DIAGNOSIS — IMO0002 Reserved for concepts with insufficient information to code with codable children: Secondary | ICD-10-CM | POA: Insufficient documentation

## 2011-06-21 DIAGNOSIS — Z23 Encounter for immunization: Secondary | ICD-10-CM

## 2011-06-21 LAB — LIPID PANEL
HDL: 48 mg/dL (ref >39)
LDL Cholesterol: 97 mg/dL (ref 0–99)
Triglycerides: 115 mg/dL (ref <150)

## 2011-06-21 LAB — TSH: TSH: 1.512 u[IU]/mL (ref 0.350–4.500)

## 2011-06-21 LAB — COMPREHENSIVE METABOLIC PANEL
AST: 16 U/L (ref 0–37)
Albumin: 4.3 g/dL (ref 3.5–5.2)
Alkaline Phosphatase: 76 U/L (ref 39–117)
BUN: 13 mg/dL (ref 6–23)
Calcium: 9.9 mg/dL (ref 8.4–10.5)
Chloride: 105 mEq/L (ref 96–112)
Glucose, Bld: 88 mg/dL (ref 70–99)
Potassium: 4.5 mEq/L (ref 3.5–5.3)
Sodium: 140 mEq/L (ref 135–145)
Total Protein: 7.4 g/dL (ref 6.0–8.3)

## 2011-06-21 LAB — GLUCOSE, CAPILLARY: Glucose-Capillary: 93 mg/dL (ref 70–99)

## 2011-06-21 MED ORDER — ASPIRIN EC 81 MG PO TBEC: 81.0000 mg | DELAYED_RELEASE_TABLET | Freq: Every day | ORAL | Status: AC

## 2011-06-21 NOTE — Patient Instructions (Signed)
Please, take one baby aspirin daily. Please, follow up with an ultrasound of your heart --[name] will call you with an appointment. You received a Flu shot today. Please, go to ED or call 911 if chest pain recurs or worsens. Follow up with [place] 1 week after your heart ultrasound and call with any questions if needed.

## 2011-06-21 NOTE — Progress Notes (Signed)
Subjective:    Patient ID: Kathleen Stone, female    DOB: 06/17/1966, 45 y.o.   MRN: [id number]  HPI 1. CP at rest on off for 1 week after a break up with her girlfriend. Denies any SOB, pain in respirations, leg swelling, orthopnea or PND; no fever, chills, dizziness. Pain lasts usually a few minutes and subsides on its own --no relationship to food intake. No similar Sx in the past. 2. Left upper back pain for 1 month, sharp, worse in left recumbent position; no trauma; no radiculopathy, no tingling, numbness or focal weakness. 3. WWE. No Hx of abnl paps; in a homsexual realtionship with one partner (just broke up). Denies any dyspareunia, vaginal discharge, abnormal bleeding; LMP 1 week ago x 4 days. No history of abnormal paps.  Review of Systems  Constitutional: Negative.   HENT: Negative for sore throat and neck pain.   Eyes: Negative for blurred vision and double vision.  Respiratory: Negative for cough, hemoptysis, sputum production and wheezing.   Cardiovascular: Positive for chest pain. Negative for palpitations, orthopnea, claudication, leg swelling and PND.  Gastrointestinal: Negative for heartburn, nausea, vomiting, abdominal pain, diarrhea, constipation and blood in stool.  Genitourinary: Negative for dysuria, urgency, frequency, hematuria and flank pain.  Musculoskeletal: Positive for back pain. Negative for myalgias, joint pain and falls.  Skin: Negative.   Neurological: Negative for dizziness, tingling, sensory change, focal weakness, loss of consciousness and headaches.  Hematological: Negative for polydipsia.  Psychiatric/Behavioral: Negative for depression and suicidal ideas.   Review of Systems  Constitutional: Negative.   HENT: Negative for sore throat and neck pain.   Eyes: Negative for blurred vision and double vision.  Respiratory: Negative for cough, hemoptysis, sputum production and wheezing.   Cardiovascular: Positive for chest pain. Negative for  palpitations, orthopnea, claudication, leg swelling and PND.  Gastrointestinal: Negative for heartburn, nausea, vomiting, abdominal pain, diarrhea, constipation and blood in stool.  Genitourinary: Negative for dysuria, urgency, frequency, hematuria and flank pain.  Musculoskeletal: Positive for back pain. Negative for myalgias, joint pain and falls.  Skin: Negative.   Neurological: Negative for dizziness, tingling, sensory change, focal weakness, loss of consciousness and headaches.  Endo/Heme/Allergies: Negative for polydipsia.  Psychiatric/Behavioral: Negative for depression and suicidal ideas.       Objective:   Physical Exam  Vitals:noted General: alert, well-developed, and cooperative to examination.  Head: normocephalic and atraumatic.  Eyes: vision grossly intact, pupils equal, pupils round, pupils reactive to light, no injection and anicteric.  Mouth: pharynx pink and moist, no erythema, and no exudates.  Neck: supple, full ROM, no thyromegaly, no JVD, and no carotid bruits.  Lungs: normal respiratory effort, no accessory muscle use, normal breath sounds, no crackles, and no wheezes. Heart: normal rate, regular rhythm, no murmur, no gallop, and no rub.  Abdomen: soft, non-tender, normal bowel sounds, no distention, no guarding, no rebound tenderness, no hepatomegaly, and no splenomegaly.  Msk: no joint swelling, no joint warmth, and no redness over joints.  Pulses: 2+ DP/PT pulses bilaterally Extremities: No cyanosis, clubbing, edema Neurologic: alert & oriented X3, cranial nerves II-XII intact, strength normal in all extremities, sensation intact to light touch, and gait normal.  Skin: turgor normal and no rashes.  Psych: Oriented X3, memory intact for recent and remote, normally interactive, good eye contact, not anxious appearing, and not depressed appearing.  GU: labia majora and minora without any lesions or masses; bartholin glands palpable at 6 and 8 o'clock positions;  vaginal canal is moist  with rugae; cervix without any lesions or petechiae; no CMT; no ovaries palpated; rectal exam with normal sphincter tone; no hemorrhoids; or lesions noted.        Assessment & Plan:  1. CP. ECG with T wave abnormality in lateral leads. Patient is with multiple health risk factors including age, obesity, sedentary lifestyle, smoking. -ASA daily -Stress ECHO -CXR -Lipids, TSH, HgbA1C, C-met -smoking cessation counseling!!!!  2. Left paraspinal MSK pain x 1 month. ? MSK strain vs lordosis.  -OTC NSAIDs PRN with meals -low heat apply to area qid PRN -Xray of  T-spine  3. WWE. Pap done and sent for cytology.  -Will follow up on results -counseling on safe practices given.  4. Fluvax and Tdap today. F/u after Stress ECHO.

## 2011-07-03 ENCOUNTER — Telehealth: Payer: Self-pay | Admitting: *Deleted

## 2011-07-03 NOTE — Telephone Encounter (Signed)
Call from pt asking about her results.  Also stated having problems sleeping.  Pt also said that she would like to go ahead and get started on medication for depression if possible.  Uses the [facility].

## 2011-07-11 ENCOUNTER — Ambulatory Visit (HOSPITAL_COMMUNITY): Payer: Self-pay | Attending: Internal Medicine

## 2011-07-12 ENCOUNTER — Other Ambulatory Visit: Payer: Self-pay | Admitting: Internal Medicine

## 2011-07-12 ENCOUNTER — Telehealth: Payer: Self-pay | Admitting: Internal Medicine

## 2011-07-12 ENCOUNTER — Ambulatory Visit (HOSPITAL_COMMUNITY): Admission: RE | Admit: 2011-07-12 | Payer: Self-pay | Source: Ambulatory Visit

## 2011-07-12 DIAGNOSIS — K047 Periapical abscess without sinus: Secondary | ICD-10-CM

## 2011-07-12 NOTE — Telephone Encounter (Signed)
Pt also said that she is having facial swelling and called the Dentist Office spoke to someone there and was told to call and to ask for an antibiotic.  Uses the [facility] on [address].  Pt home is [phone] if you need to talk with her.

## 2011-07-13 ENCOUNTER — Ambulatory Visit (INDEPENDENT_AMBULATORY_CARE_PROVIDER_SITE_OTHER): Payer: Self-pay | Admitting: Internal Medicine

## 2011-07-13 DIAGNOSIS — K047 Periapical abscess without sinus: Secondary | ICD-10-CM

## 2011-07-13 NOTE — Telephone Encounter (Addendum)
Refill called to the [facility] on [address] for [employer].  Prescription for Amoxyl 500 mg tablets 1 po tid  for 10 days with meals dispense #30 called to the [facility] as well per order of Dr. Denton Meek.

## 2011-07-16 ENCOUNTER — Encounter: Payer: Self-pay | Admitting: Internal Medicine

## 2011-07-16 NOTE — Progress Notes (Signed)
Subjective:   Patient ID: Kathleen Stone female   DOB: June 13, 1966 45 y.o.   MRN: [id number]  HPI: [name] is a 45 y.o.     Past Medical History  Diagnosis Date  . DJD (degenerative joint disease) of cervical spine   . Neuropathy     Thought to be 2/2 nerve compression in the C-spine  . Chronic back pain     Negative MRI of hips bilaterally, neg CT scan of A and P; MRI of T and L spine-> very mild deg changes from T4-5 and T7-8, cervical spondylotic changes at C5-6, very mild disc bulge at L2-3 without spinal stenosis, nl ESR, ANA, RF, HIV, TSH, and B12  . Obesity   . Migraine   . Tobacco abuse   . Galactorrhea of both breasts 2009    R. sided w/ benign papilloma excised in 4/09. L sided in 5/09   Current Outpatient Prescriptions  Medication Sig Dispense Refill  . amitriptyline (ELAVIL) 50 MG tablet Take 1 tablet (50 mg total) by mouth at bedtime.  31 tablet  3  . aspirin EC 81 MG tablet Take 1 tablet (81 mg total) by mouth daily.  150 tablet  2  . cyclobenzaprine (FLEXERIL) 10 MG tablet Take 1 tablet (10 mg total) by mouth 3 (three) times daily as needed. For pain  60 tablet  0  . gabapentin (NEURONTIN) 300 MG capsule Take 1 capsule (300 mg total) by mouth 3 (three) times daily.  90 capsule  3  . HYDROcodone-acetaminophen (LORTAB) 7.5-500 MG per tablet TAKE ONE TABLET BY MOUTH EVERY 4 HOURS AS NEEDED  90 tablet  1   Family History  Problem Relation Age of Onset  . Heart failure Mother   . Diabetes Mother   . Breast cancer      aunt  . Liver cancer      aunt   History   Social History  . Marital Status: Single    Spouse Name: N/A    Number of Children: N/A  . Years of Education: N/A   Social History Main Topics  . Smoking status: Current Everyday Smoker -- 2.0 packs/day    Types: Cigarettes  . Smokeless tobacco: None   Comment: started at age 42.  Marland Kitchen Alcohol Use: No  . Drug Use: None  . Sexually Active: None   Other Topics Concern  . None    Social History Narrative   Occupation: [employer] treasurer: female has emphysemaSmoked 2 packs/day for 20+ years, down to 1/2ppd now, willing to try to quit Alcohol use-noDrug use-noRegular exercise-no   Review of Systems: Constitutional: Denies fever, chills, diaphoresis, appetite change and fatigue.  HEENT: Denies photophobia, eye pain, redness, hearing loss, ear pain, congestion, sore throat, rhinorrhea, sneezing, mouth sores, trouble swallowing, neck pain, neck stiffness and tinnitus.   Respiratory: Denies SOB, DOE, cough, chest tightness,  and wheezing.   Cardiovascular: Denies chest pain, palpitations and leg swelling.  Gastrointestinal: Denies nausea, vomiting, abdominal pain, diarrhea, constipation, blood in stool and abdominal distention.  Genitourinary: Denies dysuria, urgency, frequency, hematuria, flank pain and difficulty urinating.  Musculoskeletal: Denies myalgias, back pain, joint swelling, arthralgias and gait problem.  Skin: Denies pallor, rash and wound.  Neurological: Denies dizziness, seizures, syncope, weakness, light-headedness, numbness and headaches.  Hematological: Denies adenopathy. Easy bruising, personal or family bleeding history  Psychiatric/Behavioral: Denies suicidal ideation, mood changes, confusion, nervousness, sleep disturbance and agitation  Objective:  Physical Exam: There were no vitals filed for  this visit. Constitutional: Vital signs reviewed.  Patient is a well-developed and well-nourished woman , in moderated distress due to tooth ache and cooperative with exam. Alert and oriented x3.  Head: Normocephalic and atraumatic Ear: TM normal bilaterally Mouth: no erythema or exudates, MMM; poor dentition with left lower molar cavity and abscess at the gum line. Eyes: PERRL, EOMI, conjunctivae normal, No scleral icterus.  Neck: Supple, Trachea midline normal ROM, No JVD, mass, thyromegaly, or carotid bruit present.  Cardiovascular: RRR,  S1 normal, S2 normal, no MRG, pulses symmetric and intact bilaterally Pulmonary/Chest: CTAB, no wheezes, rales, or rhonchi Abdominal: Soft. Non-tender, non-distended, bowel sounds are normal, no masses, organomegaly, or guarding present.  GU: no CVA tenderness Musculoskeletal: No joint deformities, erythema, or stiffness, ROM full and no nontender Hematology: no cervical, inginal, or axillary adenopathy.  Neurological: A&O x3, Strenght is normal and symmetric bilaterally, cranial nerve II-XII are grossly intact, no focal motor deficit, sensory intact to light touch bilaterally.  Skin: Warm, dry and intact. No rash, cyanosis, or clubbing.  Psychiatric: Normal mood and affect. speech and behavior is normal. Judgment and thought content normal. Cognition and memory are normal.   Assessment & Plan:    1. Tooth abscess -Amoxyl 500 mg PO tid x 10 days -Lortab for pain (controlled substance contract signed). -f/u with a dentist ASAP -saline swishes PRN

## 2011-07-16 NOTE — Progress Notes (Deleted)
  Subjective:    Patient ID: Kathleen Stone, female    DOB: August 02, 1966, 45 y.o.   MRN: [id number]  HPI    Review of Systems     Objective:   Physical Exam        Assessment & Plan:

## 2011-08-22 ENCOUNTER — Telehealth: Payer: Self-pay | Admitting: *Deleted

## 2011-08-22 NOTE — Telephone Encounter (Signed)
Call from pt asking about Mammogram appointment that was to be scheduled.  Angelina Ok, RN 08/22/2011 12:00 PM

## 2011-08-23 ENCOUNTER — Other Ambulatory Visit: Payer: Self-pay | Admitting: Internal Medicine

## 2011-08-23 ENCOUNTER — Encounter: Payer: Self-pay | Admitting: Internal Medicine

## 2011-08-23 ENCOUNTER — Ambulatory Visit (INDEPENDENT_AMBULATORY_CARE_PROVIDER_SITE_OTHER): Payer: Self-pay | Admitting: Internal Medicine

## 2011-08-23 DIAGNOSIS — M549 Dorsalgia, unspecified: Secondary | ICD-10-CM

## 2011-08-23 DIAGNOSIS — R252 Cramp and spasm: Secondary | ICD-10-CM

## 2011-08-23 DIAGNOSIS — F419 Anxiety disorder, unspecified: Secondary | ICD-10-CM | POA: Insufficient documentation

## 2011-08-23 DIAGNOSIS — Z1231 Encounter for screening mammogram for malignant neoplasm of breast: Secondary | ICD-10-CM

## 2011-08-23 DIAGNOSIS — F32A Depression, unspecified: Secondary | ICD-10-CM

## 2011-08-23 DIAGNOSIS — F329 Major depressive disorder, single episode, unspecified: Secondary | ICD-10-CM | POA: Insufficient documentation

## 2011-08-23 MED ORDER — ZOLPIDEM TARTRATE 5 MG PO TABS: 5.0000 mg | ORAL_TABLET | Freq: Every evening | ORAL | Status: DC | PRN

## 2011-08-23 MED ORDER — FLUOXETINE HCL 10 MG PO CAPS: 10.0000 mg | ORAL_CAPSULE | Freq: Every day | ORAL | Status: DC

## 2011-08-23 NOTE — Assessment & Plan Note (Addendum)
Also associated with insomnia, denies homicidal or suicidal ideation we'll start treatment with fluoxetine, and prescribe Ambien for insomnia. Phloxine dose may be titrated up on next visit for optimal control of her depression should be continued for at least 6 months

## 2011-08-23 NOTE — Progress Notes (Signed)
  Subjective:    Patient ID: NKENGE SONNTAG, female    DOB: November 18, 1965, 45 y.o.   MRN: [id number]  HPI  Patient is a 45 year old African American female with past medical history listed below including mild degenerative disc disease of the spine, presents to the outpatient clinic complaining of leg cramps that has been ongoing for the past 2 months, she reports that when she is asleep the cramps often wake her up in the middle of the night, she reports that they start in the back and radiated down to bilateral legs walking makes the cramps better, no aggravating factors noted besides laying down. Patient also reports depression and insomnia denies any suicidal or homicidal ideation. Would like to be treated for that. Otherwise denies other complaints.   Patient Active Problem List  Diagnoses  . OBESITY NOS  . TOBACCO ABUSE  . MIGRAINE HEADACHE  . NEUROPATHY  . DEGENERATIVE DISC DISEASE, CERVICAL SPINE  . LOW BACK PAIN SYNDROME  . ARM PAIN, LEFT  . UNSPECIFIED SLEEP DISTURBANCE  . HEADACHE  . SNORING  . LUMBAR STRAIN  . Dental abscess  . Leg cramps   Current Outpatient Prescriptions on File Prior to Visit  Medication Sig Dispense Refill  . aspirin EC 81 MG tablet Take 1 tablet (81 mg total) by mouth daily.  150 tablet  2  . cyclobenzaprine (FLEXERIL) 10 MG tablet Take 1 tablet (10 mg total) by mouth 3 (three) times daily as needed. For pain  60 tablet  0  . gabapentin (NEURONTIN) 300 MG capsule Take 1 capsule (300 mg total) by mouth 3 (three) times daily.  90 capsule  3  . HYDROcodone-acetaminophen (LORTAB) 7.5-500 MG per tablet TAKE ONE TABLET BY MOUTH EVERY 4 HOURS AS NEEDED  90 tablet  1   Allergies  Allergen Reactions  . Iodine     REACTION: HIVES  . Iohexol      Review of Systems  Musculoskeletal: Positive for back pain.  All other systems reviewed and are negative.       Objective:   Physical Exam  Vitals reviewed. Constitutional: She is oriented to person,  place, and time. She appears well-developed and well-nourished.  HENT:  Head: Normocephalic and atraumatic.  Eyes: Pupils are equal, round, and reactive to light.  Neck: Normal range of motion. Neck supple. No JVD present. No thyromegaly present.  Cardiovascular: Normal rate, regular rhythm and normal heart sounds.   No murmur heard. Pulmonary/Chest: Effort normal and breath sounds normal. She has no wheezes. She has no rales.  Abdominal: Soft. Bowel sounds are normal.  Musculoskeletal: Normal range of motion. She exhibits no edema.  Neurological: She is alert and oriented to person, place, and time.       Straight leg raise positive  Skin: Skin is warm and dry.          Assessment & Plan:

## 2011-08-23 NOTE — Patient Instructions (Signed)
Back Pain, Adult Low back pain is very common. About 1 in 5 people have back pain.The cause of low back pain is rarely dangerous. The pain often gets better over time.About half of people with a sudden onset of back pain feel better in just 2 weeks. About 8 in 10 people feel better by 6 weeks.  CAUSES Some common causes of back pain include:  Strain of the muscles or ligaments supporting the spine.   Wear and tear (degeneration) of the spinal discs.   Arthritis.   Direct injury to the back.  DIAGNOSIS Most of the time, the direct cause of low back pain is not known.However, back pain can be treated effectively even when the exact cause of the pain is unknown.Answering your caregiver's questions about your overall health and symptoms is one of the most accurate ways to make sure the cause of your pain is not dangerous. If your caregiver needs more information, he or she may order lab work or imaging tests (X-rays or MRIs).However, even if imaging tests show changes in your back, this usually does not require surgery. HOME CARE INSTRUCTIONS For many people, back pain returns.Since low back pain is rarely dangerous, it is often a condition that people can learn to manageon their own.   Remain active. It is stressful on the back to sit or stand in one place. Do not sit, drive, or stand in one place for more than 30 minutes at a time. Take short walks on level surfaces as soon as pain allows.Try to increase the length of time you walk each day.   Do not stay in bed.Resting more than 1 or 2 days can delay your recovery.   Do not avoid exercise or work.Your body is made to move.It is not dangerous to be active, even though your back may hurt.Your back will likely heal faster if you return to being active before your pain is gone.   Pay attention to your body when you bend and lift. Many people have less discomfortwhen lifting if they bend their knees, keep the load close to their  bodies,and avoid twisting. Often, the most comfortable positions are those that put less stress on your recovering back.   Find a comfortable position to sleep. Use a firm mattress and lie on your side with your knees slightly bent. If you lie on your back, put a pillow under your knees.   Only take over-the-counter or prescription medicines as directed by your caregiver. Over-the-counter medicines to reduce pain and inflammation are often the most helpful.Your caregiver may prescribe muscle relaxant drugs.These medicines help dull your pain so you can more quickly return to your normal activities and healthy exercise.   Put ice on the injured area.   Put ice in a plastic bag.   Place a towel between your skin and the bag.   Leave the ice on for 15 to 20 minutes, 3 to 4 times a day for the first 2 to 3 days. After that, ice and heat may be alternated to reduce pain and spasms.   Ask your caregiver about trying back exercises and gentle massage. This may be of some benefit.   Avoid feeling anxious or stressed.Stress increases muscle tension and can worsen back pain.It is important to recognize when you are anxious or stressed and learn ways to manage it.Exercise is a great option.  SEEK MEDICAL CARE IF:  You have pain that is not relieved with rest or medicine.   You have   pain that does not improve in 1 week.   You have new symptoms.   You are generally not feeling well.  SEEK IMMEDIATE MEDICAL CARE IF:   You have pain that radiates from your back into your legs.   You develop new bowel or bladder control problems.   You have unusual weakness or numbness in your arms or legs.   You develop nausea or vomiting.   You develop abdominal pain.   You feel faint.  Document Released: 09/03/2005 Document Revised: 05/16/2011 Document Reviewed: 01/22/2011 [employer] Patient Information 2012 [employer]. 

## 2011-08-23 NOTE — Assessment & Plan Note (Signed)
Patient has had this for the past 2 months, she was seen here in the clinic where she was noted to have normal TSH, CBC and normal basic metabolic panel. This rules out anemia electrolyte or thyroid abnormalities that could be contributing to this cramps. On exam the patient has positive straight leg raise exam with pain radiating down her bilateral legs, this may be a sign of progression of her disc disease, will order an MRI of the spine to evaluate, and advised the patient to continue taking pain medication including nonsteroidal anti-inflammatory medications.

## 2011-08-28 ENCOUNTER — Ambulatory Visit (HOSPITAL_COMMUNITY)
Admission: RE | Admit: 2011-08-28 | Discharge: 2011-08-28 | Disposition: A | Discharge status: Home / Self Care | Payer: Self-pay | Source: Ambulatory Visit | Attending: Internal Medicine | Admitting: Internal Medicine

## 2011-08-28 DIAGNOSIS — M549 Dorsalgia, unspecified: Secondary | ICD-10-CM

## 2011-08-28 DIAGNOSIS — M5137 Other intervertebral disc degeneration, lumbosacral region: Secondary | ICD-10-CM | POA: Insufficient documentation

## 2011-08-28 DIAGNOSIS — M51379 Other intervertebral disc degeneration, lumbosacral region without mention of lumbar back pain or lower extremity pain: Secondary | ICD-10-CM | POA: Insufficient documentation

## 2011-09-05 ENCOUNTER — Other Ambulatory Visit: Payer: Self-pay | Admitting: Internal Medicine

## 2011-09-05 DIAGNOSIS — K047 Periapical abscess without sinus: Secondary | ICD-10-CM

## 2011-09-06 ENCOUNTER — Other Ambulatory Visit: Payer: Self-pay | Admitting: Internal Medicine

## 2011-09-06 DIAGNOSIS — K047 Periapical abscess without sinus: Secondary | ICD-10-CM

## 2011-09-06 NOTE — Telephone Encounter (Signed)
Hydrocodone-ACET 7.5/500mg  rx called to [facility] pharmacy.

## 2011-09-06 NOTE — Telephone Encounter (Signed)
Rx called in 

## 2011-09-28 ENCOUNTER — Ambulatory Visit (HOSPITAL_COMMUNITY): Payer: Self-pay | Attending: Internal Medicine

## 2011-09-28 ENCOUNTER — Encounter: Payer: Self-pay | Admitting: Internal Medicine

## 2011-10-04 ENCOUNTER — Ambulatory Visit (INDEPENDENT_AMBULATORY_CARE_PROVIDER_SITE_OTHER): Payer: Self-pay | Admitting: Internal Medicine

## 2011-10-04 ENCOUNTER — Encounter: Payer: Self-pay | Admitting: Internal Medicine

## 2011-10-04 VITALS — BP 120/80 | HR 70 | Temp 97.2°F | Wt 226.8 lb

## 2011-10-04 DIAGNOSIS — R252 Cramp and spasm: Secondary | ICD-10-CM

## 2011-10-04 DIAGNOSIS — M6250 Muscle wasting and atrophy, not elsewhere classified, unspecified site: Secondary | ICD-10-CM

## 2011-10-04 DIAGNOSIS — F17200 Nicotine dependence, unspecified, uncomplicated: Secondary | ICD-10-CM

## 2011-10-04 DIAGNOSIS — F329 Major depressive disorder, single episode, unspecified: Secondary | ICD-10-CM

## 2011-10-04 LAB — CK: Total CK: 45 U/L (ref 7–177)

## 2011-10-04 MED ORDER — FLUOXETINE HCL 10 MG PO CAPS: 10.0000 mg | ORAL_CAPSULE | Freq: Every day | ORAL | Status: DC

## 2011-10-04 MED ORDER — DIPHENHYDRAMINE HCL 50 MG PO TABS: 25.0000 mg | ORAL_TABLET | Freq: Every evening | ORAL | Status: DC | PRN

## 2011-10-04 MED ORDER — MELOXICAM 15 MG PO TABS: 15.0000 mg | ORAL_TABLET | Freq: Every day | ORAL | Status: DC

## 2011-10-04 NOTE — Progress Notes (Signed)
Subjective:   Patient ID: Kathleen Stone female   DOB: 1966/03/02 46 y.o.   MRN: [id number]  HPI: [name] is a 46 y.o. female with PMH significant as outlined below who presented to the clinic for a follow up of her leg pain. Patient has been evaluated in the past extensively for her pain including MRI of the spine, Cmet, lipid panel, CBC which was within normal limits.  Patient reports that she is experiencing it after walking but worse pain would be at night . The pain wakes her up . Patient is currently taking opoids without any significant improvement. Patient continues to be smoking.      Past Medical History  Diagnosis Date  . DJD (degenerative joint disease) of cervical spine   . Neuropathy     Thought to be 2/2 nerve compression in the C-spine  . Chronic back pain     Negative MRI of hips bilaterally, neg CT scan of A and P; MRI of T and L spine-> very mild deg changes from T4-5 and T7-8, cervical spondylotic changes at C5-6, very mild disc bulge at L2-3 without spinal stenosis, nl ESR, ANA, RF, HIV, TSH, and B12  . Obesity   . Migraine   . Tobacco abuse   . Galactorrhea of both breasts 2009    R. sided w/ benign papilloma excised in 4/09. L sided in 5/09   Current Outpatient Prescriptions  Medication Sig Dispense Refill  . aspirin EC 81 MG tablet Take 1 tablet (81 mg total) by mouth daily.  150 tablet  2  . cyclobenzaprine (FLEXERIL) 10 MG tablet Take 1 tablet (10 mg total) by mouth 3 (three) times daily as needed. For pain  60 tablet  0  . FLUoxetine (PROZAC) 10 MG capsule Take 1 capsule (10 mg total) by mouth daily.  30 capsule  2  . gabapentin (NEURONTIN) 300 MG capsule Take 1 capsule (300 mg total) by mouth 3 (three) times daily.  90 capsule  3  . HYDROcodone-acetaminophen (LORTAB) 7.5-500 MG per tablet TAKE ONE TABLET BY MOUTH EVERY 4 HOURS AS NEEDED  90 tablet  0  . zolpidem (AMBIEN) 5 MG tablet Take 1 tablet (5 mg total) by mouth at bedtime as needed for  sleep.  30 tablet  0   Family History  Problem Relation Age of Onset  . Heart failure Mother   . Diabetes Mother   . Breast cancer      aunt  . Liver cancer      aunt   History   Social History  . Marital Status: Single    Spouse Name: N/A    Number of Children: N/A  . Years of Education: N/A   Social History Main Topics  . Smoking status:smoker 0.5ppd Current Everyday Smoker -- 1.5 packs/day    Types: Cigarettes  . Smokeless tobacco: None   Comment: started at age 42  . Alcohol GNF:AOZH None  . Drug YQM:VHQI  No  . Sexually Active: None   Other Topics Concern  . None   Social History Narrative   Occupation: [employer] part time Regular exercise-no   Review of Systems: Constitutional: Denies fever, chills, diaphoresis, appetite change and fatigue.  Respiratory: Denies SOB, DOE, cough, chest tightness,  and wheezing.   Cardiovascular: Denies chest pain, palpitations Gastrointestinal: Denies nausea, vomiting, abdominal pain, diarrhea,  Genitourinary: Denies dysuria, urgency, frequency, hematuria, flank pain and difficulty urinating.  Skin: Denies pallor, rash and wound.  Neurological: Denies dizziness, weakness,  light-headedness, numbness and headaches.    Objective:  Physical Exam: Filed Vitals:   10/04/11 1556  BP: 120/80  Pulse: 70  Temp: 97.2 F (36.2 C)  TempSrc: Oral  Weight: 226 lb 12.8 oz (102.876 kg)  SpO2: 100%   Constitutional: Vital signs reviewed.  Patient is a well-developed and well-nourished in no acute distress and cooperative with exam. Alert and oriented x3.  Neck: Supple,  Cardiovascular: RRR, S1 normal, S2 normal, no MRG, pulses symmetric and intact bilaterally Pulmonary/Chest: CTAB, no wheezes, rales, or rhonchi Abdominal: Soft. Non-tender, non-distended, bowel sounds are normal,  Musculoskeletal: No joint deformities, erythema, or stiffness, ROM full and no nontender Hematology: no cervical, Neurological: A&O x3, Strenght is normal and  symmetric bilaterally,  no focal motor deficit, sensory intact to light touch bilaterally.  Skin: Warm, dry and intact. No rash, cyanosis, or clubbing.

## 2011-10-04 NOTE — Assessment & Plan Note (Signed)
Counseled on smoking cessation especially in the setting of leg cramps which may be a contributing factor. Provided the 1800-QUIT NOW  Number. Patient reports she not ready yet but will think about it.

## 2011-10-04 NOTE — Assessment & Plan Note (Signed)
It seems there was some trouble with the prescription for fluoxetine therefore patient has not been taking it . I send  In a new prescription to the pharmacy.

## 2011-10-04 NOTE — Assessment & Plan Note (Addendum)
This is most likely claudication although unusual for her age but patient has long history of Tobacco abuse which is one on the risk factors for claudication. But patient has not history of HTN, DM or Hyperlipidemia. I will obtain ABI . Other DD include inflammatory muscle disease therefore will obtain CK level.  At this point concerning therapy I will d/c  Opoid therapy since no improvement was noted . I counseled the patient about smoking cessation. Furthermore informed the patient about stretching exercise, hydration and especially for nocturnal claudication leave off the sheets from feet and benadryl 25 mg prior to bedtime. If no significant improvement is noted other recommendation include to increase gabapentin to max dose or if no improvement to consider cilostazol for claudication  She should continue Aspirin

## 2011-10-04 NOTE — Patient Instructions (Signed)
1. Keep himself hydrated 2. Do stretching exercises 3 times a day for 2 weeks and then 2 times a day for 2 weeks 3. Don't placed any sheets on the lower legs with sleeping  3. Take hot showers with his stream going to your legs.

## 2011-10-10 ENCOUNTER — Ambulatory Visit (HOSPITAL_COMMUNITY)
Admission: RE | Admit: 2011-10-10 | Discharge: 2011-10-10 | Disposition: A | Discharge status: Home / Self Care | Payer: Self-pay | Source: Ambulatory Visit | Attending: Internal Medicine | Admitting: Internal Medicine

## 2011-10-10 DIAGNOSIS — M79609 Pain in unspecified limb: Secondary | ICD-10-CM | POA: Insufficient documentation

## 2011-10-10 DIAGNOSIS — R252 Cramp and spasm: Secondary | ICD-10-CM

## 2011-10-10 NOTE — Progress Notes (Signed)
VASCULAR LAB PRELIMINARY  PRELIMINARY  PRELIMINARY  PRELIMINARY   ARTERIAL Bilateral continual cramping of the lower extremities for the past four weeks right greater than left. Non exertional ABI completed: Patient is a smoker 1/2/pack per day x 28 years.   RIGHT    LEFT    PRESSURE WAVEFORM  PRESSURE WAVEFORM  BRACHIAL 150 Triphasic BRACHIAL 150 Triphasic  DP 152 Biphasic DP 168 Biphasic  PT 160 Triphasic PT 172 Triphasic                  RIGHT LEFT  ABI 1.07 1.15   ABIs and Doppler waveforms are within normal limits bilaterally   [place], RVS, [place] D. 10/10/11 1336

## 2011-11-29 ENCOUNTER — Other Ambulatory Visit: Payer: Self-pay | Admitting: *Deleted

## 2011-11-29 DIAGNOSIS — G629 Polyneuropathy, unspecified: Secondary | ICD-10-CM

## 2011-11-30 MED ORDER — GABAPENTIN 300 MG PO CAPS: 300.0000 mg | ORAL_CAPSULE | Freq: Three times a day (TID) | ORAL | Status: DC

## 2011-12-18 ENCOUNTER — Encounter: Payer: Self-pay | Admitting: Internal Medicine

## 2011-12-18 ENCOUNTER — Ambulatory Visit (INDEPENDENT_AMBULATORY_CARE_PROVIDER_SITE_OTHER): Payer: Self-pay | Admitting: Internal Medicine

## 2011-12-18 VITALS — BP 145/73 | HR 85 | Temp 97.5°F | Wt 227.6 lb

## 2011-12-18 DIAGNOSIS — H5210 Myopia, unspecified eye: Secondary | ICD-10-CM

## 2011-12-18 DIAGNOSIS — R202 Paresthesia of skin: Secondary | ICD-10-CM

## 2011-12-18 DIAGNOSIS — Z0000 Encounter for general adult medical examination without abnormal findings: Secondary | ICD-10-CM

## 2011-12-18 DIAGNOSIS — G629 Polyneuropathy, unspecified: Secondary | ICD-10-CM

## 2011-12-18 DIAGNOSIS — F32A Depression, unspecified: Secondary | ICD-10-CM

## 2011-12-18 DIAGNOSIS — G589 Mononeuropathy, unspecified: Secondary | ICD-10-CM

## 2011-12-18 DIAGNOSIS — Z79899 Other long term (current) drug therapy: Secondary | ICD-10-CM

## 2011-12-18 DIAGNOSIS — F329 Major depressive disorder, single episode, unspecified: Secondary | ICD-10-CM

## 2011-12-18 DIAGNOSIS — R209 Unspecified disturbances of skin sensation: Secondary | ICD-10-CM

## 2011-12-18 LAB — CBC
MCH: 29.6 pg (ref 26.0–34.0)
MCV: 87.6 fL (ref 78.0–100.0)
Platelets: 250 10*3/uL (ref 150–400)
RBC: 4.66 MIL/uL (ref 3.87–5.11)
RDW: 14.6 % (ref 11.5–15.5)

## 2011-12-18 MED ORDER — GABAPENTIN 300 MG PO CAPS: 300.0000 mg | ORAL_CAPSULE | Freq: Three times a day (TID) | ORAL | Status: DC

## 2011-12-18 MED ORDER — FLUOXETINE HCL 40 MG PO CAPS: 40.0000 mg | ORAL_CAPSULE | Freq: Every day | ORAL | Status: AC

## 2011-12-18 NOTE — Patient Instructions (Signed)
The clinic will call you in regard to referral to an eye doctor. Once you obtain your orange card, we will refer you for a nerve conduction test. Call with questions. Let's follow up after the test.

## 2011-12-19 ENCOUNTER — Encounter: Payer: Self-pay | Admitting: Internal Medicine

## 2011-12-19 LAB — FOLATE RBC: RBC Folate: 582 ng/mL (ref >=366)

## 2011-12-19 NOTE — Progress Notes (Signed)
Patient ID: Kathleen Stone, female   DOB: Aug 26, 1966, 46 y.o.   MRN: [id number] HPI:    1. Follow up on bilateral LE weakness and a sensation "them falling asleep." Patient denies any trauma, any illicit drug use, any fever, chills, muscle wasting. Sx started 1 year ago and progressively escalating.  Review of Systems: Negative except per history of present illness  Physical Exam:  Nursing notes and vitals reviewed General:  alert, well-developed, and cooperative to examination.   Lungs:  normal respiratory effort, no accessory muscle use, normal breath sounds, no crackles, and no wheezes. Heart:  normal rate, regular rhythm, no murmurs, no gallop, and no rub.   Abdomen:  soft, non-tender, normal bowel sounds, no distention, no guarding, no rebound tenderness, no hepatomegaly, and no splenomegaly.   Extremities:  No cyanosis, clubbing, edema. No Neurologic:  alert & oriented X3, nonfocal exam: DTR's 4+/4 bilaterally; no Babinski; sensation/proprioception intact; strength 5+/5 proximally and distally bilaterally.  Meds: Medications Prior to Admission  Medication Sig Dispense Refill  . aspirin EC 81 MG tablet Take 1 tablet (81 mg total) by mouth daily.  150 tablet  2   No current facility-administered medications on file as of 12/18/2011.    Allergies: Iodine and Iohexol Past Medical History  Diagnosis Date  . DJD (degenerative joint disease) of cervical spine   . Neuropathy     Thought to be 2/2 nerve compression in the C-spine  . Chronic back pain     Negative MRI of hips bilaterally, neg CT scan of A and P; MRI of T and L spine-> very mild deg changes from T4-5 and T7-8, cervical spondylotic changes at C5-6, very mild disc bulge at L2-3 without spinal stenosis, nl ESR, ANA, RF, HIV, TSH, and B12  . Obesity   . Migraine   . Tobacco abuse   . Galactorrhea of both breasts 2009    R. sided w/ benign papilloma excised in 4/09. L sided in 5/09   Past Surgical History  Procedure Date    . Colonoscopy w/ biopsies and polypectomy 09/2007    Dr. Leone Payor. 2 small adenomatous polyps, internal hemorrhoids-Grade 1. Rec  routine colonoscopy at age 67.  Marland Kitchen Appendectomy   . Right shoulder reconstruction      after hurting it by moving heavy objecs  . R. breast central duct excision 12/2007  . Septoplasty with bilateral inferior turbinate reduction 04/2008    Dr. Ezzard Standing   Family History  Problem Relation Age of Onset  . Heart failure Mother   . Diabetes Mother   . Breast cancer      aunt  . Liver cancer      aunt   History   Social History  . Marital Status: Single    Spouse Name: N/A    Number of Children: N/A  . Years of Education: N/A   Occupational History  . Not on file.   Social History Main Topics  . Smoking status: Current Everyday Smoker -- 1.5 packs/day    Types: Cigarettes  . Smokeless tobacco: Not on file   Comment: started at age 78.  Marland Kitchen Alcohol Use: Not on file  . Drug Use: No  . Sexually Active: Not on file   Other Topics Concern  . Not on file   Social History Narrative   Occupation: [employer] part timeDomestic Partner: female has emphysemaSmoked 2 packs/day for 20+ years, down to 1/2ppd now, willing to try to quit Alcohol use-noDrug use-noRegular exercise-no  A/P: 1. Bilateral LE neuropathy of unclear etiology. -MRI of Spine does not explain the patient's symptomatology. -check HIV, RPR, folate and Vit B12 -consider UDS -fall precautions -I declined request for a vicodin Refill -follow up in 1-2 weeks

## 2011-12-21 LAB — HIV-1 RNA QUANT-NO REFLEX-BLD: HIV 1 RNA Quant: <20 copies/mL (ref <20)

## 2012-03-12 ENCOUNTER — Other Ambulatory Visit: Payer: Self-pay | Admitting: *Deleted

## 2012-03-12 NOTE — Telephone Encounter (Signed)
Ambien and Amitriptyline are not on the patient's Med list. Please, have the patient bring ALL her medications and make an appointment with the first available PCP . Thank you.

## 2012-03-12 NOTE — Telephone Encounter (Signed)
Call from pt requesting refills on her Ambien , Amitriptyline and Gabapentin.  Pt has refills left on Gabapentin.  Would like refills sent to [facility] on [address] this time.  Angelina Ok, RN 03/12/2012 8:35 AM.

## 2012-05-21 ENCOUNTER — Encounter: Payer: Self-pay | Admitting: *Deleted

## 2012-07-24 ENCOUNTER — Ambulatory Visit (HOSPITAL_COMMUNITY)
Admission: RE | Admit: 2012-07-24 | Discharge: 2012-07-24 | Disposition: A | Discharge status: Home / Self Care | Payer: Self-pay | Source: Ambulatory Visit | Attending: Internal Medicine | Admitting: Internal Medicine

## 2012-07-24 ENCOUNTER — Encounter: Payer: Self-pay | Admitting: Internal Medicine

## 2012-07-24 ENCOUNTER — Ambulatory Visit (INDEPENDENT_AMBULATORY_CARE_PROVIDER_SITE_OTHER): Payer: Self-pay | Admitting: Internal Medicine

## 2012-07-24 VITALS — BP 145/72 | HR 62 | Temp 98.7°F | Ht 67.0 in | Wt 232.9 lb

## 2012-07-24 DIAGNOSIS — Z0000 Encounter for general adult medical examination without abnormal findings: Secondary | ICD-10-CM | POA: Insufficient documentation

## 2012-07-24 DIAGNOSIS — R9431 Abnormal electrocardiogram [ECG] [EKG]: Secondary | ICD-10-CM | POA: Insufficient documentation

## 2012-07-24 DIAGNOSIS — N390 Urinary tract infection, site not specified: Secondary | ICD-10-CM | POA: Insufficient documentation

## 2012-07-24 DIAGNOSIS — R55 Syncope and collapse: Secondary | ICD-10-CM | POA: Insufficient documentation

## 2012-07-24 DIAGNOSIS — G629 Polyneuropathy, unspecified: Secondary | ICD-10-CM

## 2012-07-24 DIAGNOSIS — Z23 Encounter for immunization: Secondary | ICD-10-CM

## 2012-07-24 DIAGNOSIS — G589 Mononeuropathy, unspecified: Secondary | ICD-10-CM

## 2012-07-24 HISTORY — DX: Syncope and collapse: R55

## 2012-07-24 LAB — CBC WITH DIFFERENTIAL/PLATELET
Basophils Absolute: 0 10*3/uL (ref 0.0–0.1)
Basophils Relative: 0 % (ref 0–1)
Eosinophils Absolute: 0.1 10*3/uL (ref 0.0–0.7)
Eosinophils Relative: 1 % (ref 0–5)
HCT: 44.2 % (ref 36.0–46.0)
Hemoglobin: 15.1 g/dL — ABNORMAL HIGH (ref 12.0–15.0)
MCH: 30 pg (ref 26.0–34.0)
MCHC: 34.2 g/dL (ref 30.0–36.0)
MCV: 87.9 fL (ref 78.0–100.0)
Monocytes Absolute: 0.5 10*3/uL (ref 0.1–1.0)
Monocytes Relative: 4 % (ref 3–12)
Neutro Abs: 8.1 10*3/uL — ABNORMAL HIGH (ref 1.7–7.7)
RDW: 15.4 % (ref 11.5–15.5)

## 2012-07-24 LAB — BASIC METABOLIC PANEL WITH GFR
BUN: 12 mg/dL (ref 6–23)
Calcium: 9.5 mg/dL (ref 8.4–10.5)
Creat: 0.82 mg/dL (ref 0.50–1.10)
GFR, Est African American: >89 mL/min
GFR, Est Non African American: 85 mL/min
Glucose, Bld: 78 mg/dL (ref 70–99)

## 2012-07-24 MED ORDER — GABAPENTIN 300 MG PO CAPS: 600.0000 mg | ORAL_CAPSULE | Freq: Every day | ORAL | Status: DC

## 2012-07-24 MED ORDER — CIPROFLOXACIN HCL 500 MG PO TABS: 500.0000 mg | ORAL_TABLET | Freq: Two times a day (BID) | ORAL | Status: AC

## 2012-07-24 NOTE — Patient Instructions (Addendum)
Please schedule a follow up appointment in 1 month or sooner if needed . Please bring your medication bottles with your next appointment. Please take your medicines as prescribed. I will call you with your lab results if anything will be abnormal.

## 2012-07-24 NOTE — Progress Notes (Signed)
  Subjective:    Patient ID: Kathleen Stone, female    DOB: 1965-12-18, 46 y.o.   MRN: [id number]  HPI:Kathleen Stone is 46 year old woman with past medical history significant for neuropathy, tobacco use comes to the clinic for an episode of syncope on Monday( 3 days prior to clinic visit).  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 She is not sure for how long she passed out but she was told for about 5-10 mins by her co-workers. She states that she was not feeling well when she was setting up the cones  -was having some difficulty breathing, palpitations and lost conscious. She was told that she rolled her eyes up. Denies any seizures or shaking, tongue biting, urinary incontinence, weakness or numbness. She was taken to [facility] hospital and was discharged home with diagnoses of UTI.   She also reports having chest soreness for 2 days since Tuesday like "somebody has hit me"- constant pain. She also does report having common cold for 2 weeks . States that she has been feeling more fatigued lately since the episode of syncope.   She smokes 1PPD for 21 years.   She complains of increased frequency and urgency but denies any dysuria or abnormal vaginal discharge.   Review of Systems  Constitutional: Positive for fatigue.  HENT: Negative for congestion and rhinorrhea.   Eyes: Negative for visual disturbance.  Respiratory: Positive for shortness of breath.   Cardiovascular: Negative for chest pain.  Gastrointestinal: Negative for nausea and vomiting.  Genitourinary: Positive for urgency and frequency. Negative for dysuria.  Neurological: Negative for headaches.  Hematological: Negative for adenopathy.       Objective:   Physical Exam  Constitutional: She is oriented to person, place, and time. She appears well-developed and well-nourished.  HENT:  Head: Normocephalic and atraumatic.  Mouth/Throat: No oropharyngeal exudate.  Eyes: Conjunctivae normal  and EOM are normal. Pupils are equal, round, and reactive to light.  Neck: Normal range of motion. Neck supple. No JVD present. No tracheal deviation present. No thyromegaly present.  Cardiovascular: Normal rate.   Pulmonary/Chest: Effort normal and breath sounds normal. No stridor. No respiratory distress. She has no wheezes. She has no rales.  Musculoskeletal: Normal range of motion. She exhibits no edema and no tenderness.  Neurological: She is alert and oriented to person, place, and time. She has normal reflexes. No cranial nerve deficit. Coordination normal.       Motor strength - 5/5 in all four extremities.  Sensations intact to light touch.   Skin: Skin is warm.          Assessment & Plan:

## 2012-07-24 NOTE — Assessment & Plan Note (Signed)
She got a flu-Shot today

## 2012-07-24 NOTE — Assessment & Plan Note (Addendum)
Patient presents with an episode of syncope at work associated with prodrome of palpitations and shortness of breath. Was taken to [facility] and was discharged home with a diagnosis of UTI . She reports having flulike symptoms for last 2 weeks and does report some chest soreness for last 2 days. Differentials for her syncope include cardiac arrhythmias versus vasovagal.  Her WELL'S score is zero, which makes PE very less likely. Neurological causes appear to be less likely in the absence of any focal neurological deficits Etiology for her syncope is not clear that this time. EKG was obtained today that shows sinus bradycardia, ST elevation in the V1 and V2 but this was also seen in her old EKG from October 2012. -  2 D echo - Outpatient Holter monitor.  - Check CBC, CMET  Patient does not have orange card. Await orange card to schedule outpatient Holter monitor.  Update: Her labs show mildly elevated white count with left shift that could be related to mild  bronchitis

## 2012-07-24 NOTE — Assessment & Plan Note (Signed)
She reports  frequency and urgency but denies any dysuria. Her dipstick for trace leukocytes and trace blood. - Treat her 3 days of ciprofloxacin for symptomatic UTI. - Follow up cultures

## 2012-07-24 NOTE — Assessment & Plan Note (Signed)
Reports increasing pain in her lower extremities. Etiology for her neuropathy remains unclear. Her workup including TSH, vitamin B12 and CK levels were within normal limits. Although she has been Instructed to take 300 mg of Neurontin 3 times a day but she has been just taking 300 each bedtime because it makes her drowsy at work. - Would change the dose of Neurontin to 600 mg qHS.

## 2012-07-25 LAB — URINALYSIS, ROUTINE W REFLEX MICROSCOPIC
Bilirubin Urine: NEGATIVE
Hgb urine dipstick: NEGATIVE
Ketones, ur: NEGATIVE mg/dL
Nitrite: NEGATIVE
Specific Gravity, Urine: 1.024 (ref 1.005–1.030)
Urobilinogen, UA: 1 mg/dL (ref 0.0–1.0)

## 2012-07-26 LAB — URINE CULTURE: Organism ID, Bacteria: NO GROWTH

## 2012-08-17 ENCOUNTER — Encounter: Payer: Self-pay | Admitting: Internal Medicine

## 2012-08-17 DIAGNOSIS — Z860100 Personal history of colon polyps, unspecified: Secondary | ICD-10-CM | POA: Insufficient documentation

## 2012-08-19 ENCOUNTER — Encounter: Payer: Self-pay | Admitting: Internal Medicine

## 2013-01-20 ENCOUNTER — Emergency Department (HOSPITAL_COMMUNITY): Payer: Self-pay

## 2013-01-20 ENCOUNTER — Emergency Department (HOSPITAL_COMMUNITY)
Admission: EM | Admit: 2013-01-20 | Discharge: 2013-01-20 | Disposition: A | Discharge status: Home / Self Care | Payer: Self-pay | Attending: Emergency Medicine | Admitting: Emergency Medicine

## 2013-01-20 ENCOUNTER — Encounter (HOSPITAL_COMMUNITY): Payer: Self-pay | Admitting: Emergency Medicine

## 2013-01-20 DIAGNOSIS — Z79899 Other long term (current) drug therapy: Secondary | ICD-10-CM | POA: Insufficient documentation

## 2013-01-20 DIAGNOSIS — M549 Dorsalgia, unspecified: Secondary | ICD-10-CM | POA: Insufficient documentation

## 2013-01-20 DIAGNOSIS — Z860100 Personal history of colon polyps, unspecified: Secondary | ICD-10-CM | POA: Insufficient documentation

## 2013-01-20 DIAGNOSIS — E669 Obesity, unspecified: Secondary | ICD-10-CM | POA: Insufficient documentation

## 2013-01-20 DIAGNOSIS — G8929 Other chronic pain: Secondary | ICD-10-CM | POA: Insufficient documentation

## 2013-01-20 DIAGNOSIS — G43909 Migraine, unspecified, not intractable, without status migrainosus: Secondary | ICD-10-CM | POA: Insufficient documentation

## 2013-01-20 DIAGNOSIS — R0789 Other chest pain: Secondary | ICD-10-CM | POA: Insufficient documentation

## 2013-01-20 DIAGNOSIS — Z8739 Personal history of other diseases of the musculoskeletal system and connective tissue: Secondary | ICD-10-CM | POA: Insufficient documentation

## 2013-01-20 DIAGNOSIS — F17200 Nicotine dependence, unspecified, uncomplicated: Secondary | ICD-10-CM | POA: Insufficient documentation

## 2013-01-20 DIAGNOSIS — R202 Paresthesia of skin: Secondary | ICD-10-CM

## 2013-01-20 LAB — CBC
MCH: 29.6 pg (ref 26.0–34.0)
Platelets: 220 10*3/uL (ref 150–400)
RBC: 4.87 MIL/uL (ref 3.87–5.11)
RDW: 15 % (ref 11.5–15.5)
WBC: 10.3 10*3/uL (ref 4.0–10.5)

## 2013-01-20 LAB — URINALYSIS, ROUTINE W REFLEX MICROSCOPIC
Glucose, UA: NEGATIVE mg/dL
Protein, ur: NEGATIVE mg/dL
Specific Gravity, Urine: 1.023 (ref 1.005–1.030)
pH: 5 (ref 5.0–8.0)

## 2013-01-20 LAB — PREGNANCY, URINE: Preg Test, Ur: NEGATIVE

## 2013-01-20 LAB — URINE MICROSCOPIC-ADD ON

## 2013-01-20 LAB — BASIC METABOLIC PANEL
CO2: 26 mEq/L (ref 19–32)
Calcium: 9.5 mg/dL (ref 8.4–10.5)
GFR calc Af Amer: >90 mL/min (ref >90)
Sodium: 138 mEq/L (ref 135–145)

## 2013-01-20 NOTE — ED Notes (Signed)
Patient is alert and orientedx4.  Patient was explained discharge instructions and they understood them with no questions.  The patient's partner, Lavina Hamman, is taking the patient home.

## 2013-01-20 NOTE — ED Notes (Signed)
Family at bedside. 

## 2013-01-20 NOTE — ED Notes (Signed)
Patient says she started having chest pain two weeks ago and she thought it was a pulled muscle and it would go away.  The pain remained and it got worse and she started having SOB with exertion so she decided to come in to the ED.

## 2013-01-20 NOTE — ED Provider Notes (Signed)
I saw and evaluated the patient, reviewed the resident's note and I agree with the findings and plan.  Pt with reproducible musculoskeletal chest pain for about a week. EKG unchanged from previous. Agree with resident interpretation. She also has 'tingling' in her hands bilaterally since this AM, in a glove pattern, not in a dermatomal or specific peripheral nerve distribution. She has had nerve conduction studies done for spasms in LE in the past. Labs and imaging here are normal. No concern for ACS or PE. No evidence of serious cardiac or pulmonary cause of pain. Advised PCP followup for pain and Neurology follow up for hand tingling if it persists.   Charles B. Bernette Mayers, MD 01/20/13 1250

## 2013-01-20 NOTE — ED Notes (Signed)
Pt c/o mid sternal CP worse with palpation x 1 week; pt denies SOB

## 2013-01-20 NOTE — ED Provider Notes (Signed)
History     CSN: [id number]  Arrival date & time 01/20/13  1049   First MD Initiated Contact with Patient 01/20/13 1114      Chief Complaint  Patient presents with  . Chest Pain     HPI Kathleen Stone is a 47 year old woman with PMH of chronic back pain, migraine, and tobacco abuse who comes in to the [facility] for evaluation of left upper chest pain x 1week. She states that the pain is like an ache that comes and goes, does not radiate to her arm, and is a 7/10 at its worse. Palpation to the area makes the pain worse. Advil made it better. She also had a headache last night that improved with Advil. 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 She denies having this chest pain before. She has red itchy eyes which she attributes to allergy to pollen; she has not used allergy eye drops. She has a nonproductive cough with no increased sputum.  She has had no recent travel or immobilization, no calf pain, no history of DVT and no known active malignancy.   She denies fever/chills, dizziness, syncope, shortness of breath, diaphoresis, dysuria, diarrhea, constipation, or weakness.    Past Medical History  Diagnosis Date  . DJD (degenerative joint disease) of cervical spine   . Neuropathy     Thought to be 2/2 nerve compression in the C-spine  . Chronic back pain     Negative MRI of hips bilaterally, neg CT scan of A and P; MRI of T and L spine-> very mild deg changes from T4-5 and T7-8, cervical spondylotic changes at C5-6, very mild disc bulge at L2-3 without spinal stenosis, nl ESR, ANA, RF, HIV, TSH, and B12  . Obesity   . Migraine   . Tobacco abuse   . Galactorrhea of both breasts 2009    R. sided w/ benign papilloma excised in 4/09. L sided in 5/09  . Personal history of adenomatous colonic polyp     09/2007 - diminutive adenoma Kathleen Stone)    Past Surgical History  Procedure Laterality Date  . Colonoscopy  w/ biopsies and polypectomy  09/2007    Dr. Leone Stone. 2 small adenomatous polyps, internal hemorrhoids-Grade 1. Rec  routine colonoscopy at age 20.  Marland Kitchen Appendectomy    . Right shoulder reconstruction       after hurting it by moving heavy objecs  . R. breast central duct excision  12/2007  . Septoplasty with bilateral inferior turbinate reduction  04/2008    Dr. Ezzard Stone    Family History  Problem Relation Age of Onset  . Heart failure Mother   . Diabetes Mother   . Breast cancer      aunt  . Liver cancer      aunt    History  Substance Use Topics  . Smoking status: Current Every Day Smoker -- 1.50 packs/day    Types: Cigarettes  . Smokeless tobacco: Not on file     Comment: started at age 56.  Marland Kitchen Alcohol Use: Not on file    OB History   Grav Para Term Preterm Abortions TAB SAB Ect Mult Living                  Review of Systems  Constitutional: Negative for fever, chills, diaphoresis, activity change, appetite change, fatigue and unexpected weight change.  HENT: Negative for congestion.   Eyes:  Negative for itching.  Respiratory: Positive for cough. Negative for chest tightness, shortness of breath and wheezing.   Cardiovascular: Positive for chest pain. Negative for palpitations and leg swelling.  Gastrointestinal: Negative for nausea, vomiting, abdominal pain, diarrhea, constipation and abdominal distention.  Genitourinary: Negative for dysuria, frequency and flank pain.  Musculoskeletal: Positive for back pain.  Skin: Negative for rash.  Neurological: Positive for headaches. Negative for dizziness, tremors, seizures, syncope and numbness.  Psychiatric/Behavioral: Negative for agitation.    Allergies  Iodine and Iohexol  Home Medications   Current Outpatient Rx  Name  Route  Sig  Dispense  Refill  . gabapentin (NEURONTIN) 300 MG capsule   Oral   Take 2 capsules (600 mg total) by mouth at bedtime.   120 capsule   3     BP 138/82  Pulse 60  Temp(Src) 98.2 F  (36.8 C) (Oral)  Resp 18  SpO2 100%  Physical Exam  Nursing note and vitals reviewed. Constitutional: She is oriented to person, place, and time. No distress.  Obese  HENT:  Head: Atraumatic.  Mouth/Throat: Oropharynx is clear and moist.  Eyes:  Mildly injected conjunctiva bilaterally  Neck: Neck supple.  Cardiovascular: Regular rhythm.   No murmur heard. Borderline bradycardia   Pulmonary/Chest: Effort normal and breath sounds normal. No respiratory distress. She has no wheezes. She has no rales. She exhibits no tenderness.  Abdominal: Soft. Bowel sounds are normal. She exhibits no distension. There is no tenderness. There is no rebound and no guarding.  Musculoskeletal: Normal range of motion. She exhibits edema and tenderness.  Left upper chest wall with tenderness to palpation, full ROM of left arm with no increased pain in left chest.  Trace pretibial edema No calf tenderness, swelling or erythema bilaterally  Neurological: She is alert and oriented to person, place, and time.  Strength 5/5 in UE.   Skin: Skin is warm and dry. No rash noted. She is not diaphoretic. No erythema.  Psychiatric: She has a normal mood and affect. Her behavior is normal.    ED Course  Procedures (including critical care time)  Labs Reviewed  BASIC METABOLIC PANEL - Abnormal; Notable for the following:    Glucose, Bld 101 (*)    All other components within normal limits  CBC  URINALYSIS, DIPSTICK ONLY  CK  POCT I-STAT TROPONIN I   Dg Chest 2 View  01/20/2013  *RADIOLOGY REPORT*  Clinical Data: Chest pain, shortness of breath  CHEST - 2 VIEW  Comparison: 10/4/ 12  Findings: Cardiomediastinal silhouette is stable.  No acute infiltrate or pleural effusion.  No pulmonary edema. Mild degenerative changes thoracic spine.  IMPRESSION: No active disease.  No significant change.   Original Report Authenticated By: Natasha Mead, M.D.      No diagnosis found.   Date: 01/20/2013  Rate: 70  Rhythm:  normal sinus rhythm  QRS Axis: normal  Intervals: normal  ST/T Wave abnormalities: T wave inversion in lateral leads (I, aVL, V6) as seen in previous tracings. Non specific ST elevation seen in V2-V3 as seen in previous EKG in 07/24/12  Conduction Disutrbances:none  Narrative Interpretation:   Old EKG Reviewed: ST elevation not as pronounced, slightly improved     MDM  47 year old woman with PMH of chronic back pain, migraines, tobacco abuse presenting with Left upper chest pain x 1 week that feels like a muscle ache/spasm. No history of cardiac disease.  EKG with no new changes, troponin x1 negative. Given her  atypical chest pain duration x 1 week MI is unlikely. She has low TIMI score (a questionable 1) making her low risk for MI. Her Wells score is ZERO making PE unlikely. Her CXR is unremarkable for PNA. She has no signs of aortic dissection with no widened mediastinum in CXR or tearing chest pain.  BMET with nl creatinine (inconsistent with dehydration), CBC with no leucocytosis (pt also afebrile, CXR unremarkable) making PNA less likely.   MSK pain is the most likely cause of her pain. Percocet once for pain control.   UA pending, if normal, plan to discharge with Flexeril and Advil for MSK pain.      Ky Barban, MD 01/20/13 1225

## 2013-01-20 NOTE — ED Notes (Signed)
Patient transported to X-ray 

## 2013-05-15 ENCOUNTER — Encounter: Payer: Self-pay | Admitting: Internal Medicine

## 2013-10-14 ENCOUNTER — Encounter: Payer: Self-pay | Admitting: Internal Medicine

## 2013-10-14 ENCOUNTER — Ambulatory Visit (INDEPENDENT_AMBULATORY_CARE_PROVIDER_SITE_OTHER): Payer: Self-pay | Admitting: Internal Medicine

## 2013-10-14 VITALS — BP 133/76 | HR 83 | Temp 98.2°F | Ht 67.0 in | Wt 246.1 lb

## 2013-10-14 DIAGNOSIS — M545 Low back pain, unspecified: Secondary | ICD-10-CM

## 2013-10-14 MED ORDER — CYCLOBENZAPRINE HCL 10 MG PO TABS: 10.0000 mg | ORAL_TABLET | Freq: Every day | ORAL | Status: AC

## 2013-10-14 NOTE — Patient Instructions (Signed)
1. Please take 2 tablets of Aleve (440mg  total) two times per day as needed for back pain.  Take with food.  Take Flexeril 1 tablet at night.  This tablet can make you sleep so do not take before operating machinery or driving.   2. Please take all medications as prescribed.   3. If you have worsening of your symptoms or new symptoms arise, please call the clinic ([phone]), or go to the ER immediately if symptoms are severe.   Return to clinic in 3-4 weeks for follow-up. Back Pain, Adult Low back pain is very common. About 1 in 5 people have back pain.The cause of low back pain is rarely dangerous. The pain often gets better over time.About half of people with a sudden onset of back pain feel better in just 2 weeks. About 8 in 10 people feel better by 6 weeks.  CAUSES Some common causes of back pain include:  Strain of the muscles or ligaments supporting the spine.  Wear and tear (degeneration) of the spinal discs.  Arthritis.  Direct injury to the back. DIAGNOSIS Most of the time, the direct cause of low back pain is not known.However, back pain can be treated effectively even when the exact cause of the pain is unknown.Answering your caregiver's questions about your overall health and symptoms is one of the most accurate ways to make sure the cause of your pain is not dangerous. If your caregiver needs more information, he or she may order lab work or imaging tests (X-rays or MRIs).However, even if imaging tests show changes in your back, this usually does not require surgery. HOME CARE INSTRUCTIONS For many people, back pain returns.Since low back pain is rarely dangerous, it is often a condition that people can learn to [facility] their own.   Remain active. It is stressful on the back to sit or stand in one place. Do not sit, drive, or stand in one place for more than 30 minutes at a time. Take short walks on level surfaces as soon as pain allows.Try to increase the length of  time you walk each day.  Do not stay in bed.Resting more than 1 or 2 days can delay your recovery.  Do not avoid exercise or work.Your body is made to move.It is not dangerous to be active, even though your back may hurt.Your back will likely heal faster if you return to being active before your pain is gone.  Pay attention to your body when you bend and lift. Many people have less discomfortwhen lifting if they bend their knees, keep the load close to their bodies,and avoid twisting. Often, the most comfortable positions are those that put less stress on your recovering back.  Find a comfortable position to sleep. Use a firm mattress and lie on your side with your knees slightly bent. If you lie on your back, put a pillow under your knees.  Only take over-the-counter or prescription medicines as directed by your caregiver. Over-the-counter medicines to reduce pain and inflammation are often the most helpful.Your caregiver may prescribe muscle relaxant drugs.These medicines help dull your pain so you can more quickly return to your normal activities and healthy exercise.  Put ice on the injured area.  Put ice in a plastic bag.  Place a towel between your skin and the bag.  Leave the ice on for 15-20 minutes, 03-04 times a day for the first 2 to 3 days. After that, ice and heat may be alternated to reduce pain  and spasms.  Ask your caregiver about trying back exercises and gentle massage. This may be of some benefit.  Avoid feeling anxious or stressed.Stress increases muscle tension and can worsen back pain.It is important to recognize when you are anxious or stressed and learn ways to manage it.Exercise is a great option. SEEK MEDICAL CARE IF:  You have pain that is not relieved with rest or medicine.  You have pain that does not improve in 1 week.  You have new symptoms.  You are generally not feeling well. SEEK IMMEDIATE MEDICAL CARE IF:   You have pain that radiates  from your back into your legs.  You develop new bowel or bladder control problems.  You have unusual weakness or numbness in your arms or legs.  You develop nausea or vomiting.  You develop abdominal pain.  You feel faint. Document Released: 09/03/2005 Document Revised: 03/04/2012 Document Reviewed: 01/22/2011 [facility] Patient Information 2014 [place], [place].

## 2013-10-14 NOTE — Progress Notes (Signed)
   Subjective:    Patient ID: Kathleen Stone, female    DOB: [birth year], 48 y.o.   MRN: [id number]  HPI Comments: Ms. 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  She reports pain for the past 10 years.  Previously on Gabapentin nightly up until about 1 month ago.  She stopped taking it because she felt it was not helping the pain.  Pain was under control but came back 5 days ago.  She has a job requiring her to stand for long hours but no inciting event or trauma.  She noticed right sided low back pain radiating down right leg while walking in the grocery store five days ago.  Also with radiation of pain into her legs to calves and cramping.  She reports B/L symptoms occuring intermittently every day and waking her from sleep at night.  She gets up and walks around which improves the pain a bit.   She says the symptoms she is experiencing now are similar to previous episodes.  No loss of bowel or bladder control, saddle anesthesia, or gait abnormalities.  She tried her girlfriend's Lyrica which helped a bit.         Review of Systems  Constitutional: Negative for fever, chills, appetite change and unexpected weight change.  Respiratory: Negative for shortness of breath.   Cardiovascular: Negative for chest pain.  Gastrointestinal: Negative for nausea, vomiting, abdominal pain, diarrhea and constipation.  Genitourinary: Negative for dysuria.  Musculoskeletal: Positive for back pain.  Neurological: Negative for dizziness and light-headedness.       Objective:   Physical Exam  Vitals reviewed. Constitutional: She is oriented to person, place, and time. No distress.  HENT:  Mouth/Throat: Oropharynx is clear and moist. No oropharyngeal exudate.  Eyes: Pupils are equal, round, and reactive to light.  Cardiovascular: Normal rate, regular rhythm and normal heart sounds.   Pulmonary/Chest: Effort normal and breath sounds normal. No respiratory distress.  She has no wheezes. She has no rales.  Abdominal: Soft. Bowel sounds are normal. She exhibits no distension. There is no tenderness.  Musculoskeletal: Normal range of motion. She exhibits no tenderness.  Trace B/L pretibial edema.  Lumbar musculature TTP B/L.  Positive straight leg raise B/L. Spine non-TTP.  Neurological: She is alert and oriented to person, place, and time. She exhibits normal muscle tone.  5/5 motor strength B/L upper and lower extremities, including dorsiflexion and plantarflexion.  Sensation intact.  Ankle reflex intact.  Skin: Skin is warm. She is not diaphoretic.  Psychiatric: She has a normal mood and affect. Her behavior is normal.          Assessment & Plan:  Please see problem based assessment and plan.

## 2013-10-14 NOTE — Assessment & Plan Note (Addendum)
Patient with complaint of back pain and spasms similar to previous episodes of low back pain.  Likely attributable to degenerative changes in the Lumbar spine seen on MRI in 2012.  Since she has no red flag symptoms and pain has been present for only a few days will manage conservatively.  Plan:  1) NSAID (she prefers OTC Aleve) - advised to take 2 tables (440mg ) BID for the next few weeks.            2) Flexeril 10mg  qHS for muscle spasms.            3) She would like to wait for PT referral.            4) Return in 1 month for follow-up.  Can decide on need for further imaging at that time.            5) Advised to go to the ED with loss of bowel or bladder control, saddle anesthesia or new or worsening symptoms.

## 2013-10-17 NOTE — Progress Notes (Signed)
Case discussed with Dr. Wilson at the time of the visit.  We reviewed the resident's history and exam and pertinent patient test results.  I agree with the assessment, diagnosis, and plan of care documented in the resident's note. 

## 2013-10-26 ENCOUNTER — Ambulatory Visit: Payer: Self-pay

## 2015-06-14 ENCOUNTER — Encounter: Payer: Self-pay | Admitting: Internal Medicine

## 2015-08-02 ENCOUNTER — Encounter: Payer: Self-pay | Admitting: Student

## 2015-10-31 ENCOUNTER — Telehealth: Payer: Self-pay | Admitting: Internal Medicine

## 2015-10-31 NOTE — Telephone Encounter (Signed)
APPT REMINDER CALL, PHONE HAS BEEN DISCONNECTED

## 2015-11-01 ENCOUNTER — Ambulatory Visit (INDEPENDENT_AMBULATORY_CARE_PROVIDER_SITE_OTHER): Payer: 59 | Admitting: Internal Medicine

## 2015-11-01 ENCOUNTER — Other Ambulatory Visit: Payer: Self-pay | Admitting: Internal Medicine

## 2015-11-01 ENCOUNTER — Encounter: Payer: Self-pay | Admitting: Internal Medicine

## 2015-11-01 ENCOUNTER — Telehealth: Payer: Self-pay

## 2015-11-01 VITALS — BP 134/68 | HR 86 | Temp 98.4°F | Ht 66.0 in | Wt 226.1 lb

## 2015-11-01 DIAGNOSIS — Z0000 Encounter for general adult medical examination without abnormal findings: Secondary | ICD-10-CM

## 2015-11-01 DIAGNOSIS — H159 Unspecified disorder of sclera: Secondary | ICD-10-CM | POA: Diagnosis not present

## 2015-11-01 DIAGNOSIS — G8929 Other chronic pain: Secondary | ICD-10-CM | POA: Diagnosis not present

## 2015-11-01 DIAGNOSIS — H1589 Other disorders of sclera: Secondary | ICD-10-CM | POA: Diagnosis not present

## 2015-11-01 DIAGNOSIS — F17210 Nicotine dependence, cigarettes, uncomplicated: Secondary | ICD-10-CM

## 2015-11-01 DIAGNOSIS — Z23 Encounter for immunization: Secondary | ICD-10-CM

## 2015-11-01 DIAGNOSIS — F17200 Nicotine dependence, unspecified, uncomplicated: Secondary | ICD-10-CM

## 2015-11-01 DIAGNOSIS — M545 Low back pain: Secondary | ICD-10-CM | POA: Diagnosis not present

## 2015-11-01 DIAGNOSIS — Z1231 Encounter for screening mammogram for malignant neoplasm of breast: Secondary | ICD-10-CM

## 2015-11-01 MED ORDER — MELOXICAM 7.5 MG PO TABS: 7.5000 mg | ORAL_TABLET | Freq: Every day | ORAL | Status: DC

## 2015-11-01 NOTE — Assessment & Plan Note (Signed)
  Assessment: Comments: Patient smokes 1/2 - 1 pack per day. Does not seem interested in quitting at this time. Reinforced the importance of this.   Plan: Instruction/counseling given:  I counseled patient on the dangers of tobacco use, advised patient to stop smoking, and reviewed strategies to maximize success. Educational resources provided:  [employer] account manager) brochure Self management tools provided:    Medications to assist with smoking cessation:  None Patient agreed to the following self-care plans for smoking cessation: cut down the number of cigarettes smoked  Other plans: RTC in 1 month. Will readdress at that time, discuss other modalities to help quit.

## 2015-11-01 NOTE — Assessment & Plan Note (Signed)
Schedule for screening mammo. Flu shot given today.

## 2015-11-01 NOTE — Telephone Encounter (Signed)
Left message for pt informing mammogram scheduled for 3/3 at 3:45pm at the [facility] on [address]  IF needs to reschedule call [phone]

## 2015-11-01 NOTE — Patient Instructions (Signed)
1. Please return for follow up in 1 month.   2. Please take all medications as previously prescribed with the following changes:  Take Mobic 7.5 mg once daily for 1 week. After that, only take as needed for pain. Never more than once daily. This is a long acting medication that should help throughout the day.   Please try and set up outpatient physical therapy. This will help significantly with your back pain.   WEIGHT LOSS is the thing that will help the most with your back pain.   PLEASE QUIT SMOKING.   3. If you have worsening of your symptoms or new symptoms arise, please call the clinic [phone]), or go to the ER immediately if symptoms are severe.  Chronic Back Pain  When back pain lasts longer than 3 months, it is called chronic back pain.People with chronic back pain often go through certain periods that are more intense (flare-ups).  CAUSES Chronic back pain can be caused by wear and tear (degeneration) on different structures in your back. These structures include:  The bones of your spine (vertebrae) and the joints surrounding your spinal cord and nerve roots (facets).  The strong, fibrous tissues that connect your vertebrae (ligaments). Degeneration of these structures may result in pressure on your nerves. This can lead to constant pain. HOME CARE INSTRUCTIONS  Avoid bending, heavy lifting, prolonged sitting, and activities which make the problem worse.  Take brief periods of rest throughout the day to reduce your pain. Lying down or standing usually is better than sitting while you are resting.  Take over-the-counter or prescription medicines only as directed by your caregiver. SEEK IMMEDIATE MEDICAL CARE IF:   You have weakness or numbness in one of your legs or feet.  You have trouble controlling your bladder or bowels.  You have nausea, vomiting, abdominal pain, shortness of breath, or fainting.   This information is not intended to replace advice given to you  by your health care provider. Make sure you discuss any questions you have with your health care provider.   Document Released: 10/11/2004 Document Revised: 11/26/2011 Document Reviewed: 02/21/2015 Elsevier Interactive Patient Education [employer].

## 2015-11-01 NOTE — Progress Notes (Signed)
Subjective:   Patient ID: Kathleen Stone female   DOB: 04-12-1966 50 y.o.   MRN: [id number]  HPI: [name] is a 50 y.o. female w/ PMHx of DJD, chronic back pain, and tobacco abuse, presents to the clinic today for a follow-up visit for low back pain. Patient has had this issue for quite some time and was last seen in 09/2013 for this. Since that time she has been doing well until somewhat recently when her lower back started to hurt again. She states that the pain starts in the lower back and sometimes radiates down the front of her thighs. She also admits to some tingling and pain in her legs and feet as well. No saddle anesthesia, weakness, or incontinence. 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 Previous imaging of her spine in 2012 was suggestive of mild degenerative changes. No other aches and pains that she can comment on. She is accompanied by her partner who also has noticed some discoloration of her sclera that she feels is somewhat new.   Patient smokes 1/2 to 1 pack per day, does not drink alcohol, or use recreational drugs.   Past Medical History  Diagnosis Date  . DJD (degenerative joint disease) of cervical spine   . Neuropathy (HCC)     Thought to be 2/2 nerve compression in the C-spine  . Chronic back pain     Negative MRI of hips bilaterally, neg CT scan of A and P; MRI of T and L spine-> very mild deg changes from T4-5 and T7-8, cervical spondylotic changes at C5-6, very mild disc bulge at L2-3 without spinal stenosis, nl ESR, ANA, RF, HIV, TSH, and B12  . Obesity   . Migraine   . Tobacco abuse   . Galactorrhea of both breasts 2009    R. sided w/ benign papilloma excised in 4/09. L sided in 5/09  . Personal history of adenomatous colonic polyp     09/2007 - diminutive adenoma [name])   Current Outpatient Prescriptions  Medication Sig Dispense Refill  . gabapentin (NEURONTIN) 300 MG capsule Take 2 capsules (600 mg total) by mouth at bedtime. 120  capsule 3   No current facility-administered medications for this visit.    Review of Systems: General: Denies fever, chills, diaphoresis, appetite change and fatigue.  Respiratory: Denies SOB, DOE, cough, and wheezing.   Cardiovascular: Denies chest pain and palpitations.  Gastrointestinal: Denies nausea, vomiting, abdominal pain, and diarrhea.  Genitourinary: Denies dysuria, increased frequency, and flank pain. Endocrine: Denies hot or cold intolerance, polyuria, and polydipsia. Musculoskeletal: Positive for lower back pain. Denies myalgias, joint swelling, arthralgias and gait problem.  Skin: Denies pallor, rash and wounds.  Neurological: Denies dizziness, seizures, syncope, weakness, lightheadedness, numbness and headaches.  Psychiatric/Behavioral: Denies mood changes, and sleep disturbances.  Objective:   Physical Exam: Filed Vitals:   11/01/15 1517  BP: 134/68  Pulse: 86  Temp: 98.4 F (36.9 C)  TempSrc: Oral  Height: '5\' 6"'  (1.676 m)  Weight: 226 lb 1.6 oz (102.558 kg)  SpO2: 100%    General: Overweight AA female, alert, cooperative, NAD. HEENT: PERRL, EOMI. Moist mucus membranes. Darkened sclera (?physiological) Neck: Full range of motion without pain, supple, no lymphadenopathy or carotid bruits Lungs: Clear to ascultation bilaterally, normal work of respiration, no wheezes, rales, rhonchi Heart: RRR, no murmurs, gallops, or rubs Abdomen: Soft, non-tender, non-distended, BS + Extremities: No cyanosis, clubbing, or edema. Negative straight leg raise bilaterally. Mild tenderness  over the lumbar spine, mostly paraspinal tenderness. No hip pain.  Neurologic: Alert & oriented X3, cranial nerves II-XII intact, strength grossly intact, sensation intact to light touch   Assessment & Plan:   Please see problem based assessment and plan.

## 2015-11-01 NOTE — Assessment & Plan Note (Signed)
Patient with chronic low back pain and possible mild lumbar radiculopathy. Negative straight leg raise however. Mild paraspinal tenderness on exam. No hip pain, no limitations with ROM. No alarm symptoms; no saddle anesthesia, incontinence, or LE weakness. Suspect most of this is muscle pain related to being on her feet all day, suspect lumbar strain.  -Outpatient PT for improved mobility -Mobic 7.5 mg daily for 1 week, then prn until follow up in 1 month -Heating pad -Discussed weight loss, continued activity, smoking cessation -RTC in 1 month

## 2015-11-01 NOTE — Assessment & Plan Note (Signed)
Partner states that she has notices some eye color change, saying that it appears darker. No previous history of liver problems, no alcohol abuse. No abdominal pain or appearance of jaundice on exam. Suspect this is physiological rather than pathological.  -Check CMP to assess for LFT abnormalities, bili elevation -RTC in 1 month

## 2015-11-02 LAB — CMP14 + ANION GAP
ALBUMIN: 3.8 g/dL (ref 3.5–5.5)
ALK PHOS: 79 IU/L (ref 39–117)
ALT: 13 IU/L (ref 0–32)
ANION GAP: 15 mmol/L (ref 10.0–18.0)
AST: 17 IU/L (ref 0–40)
Albumin/Globulin Ratio: 1.5 (ref 1.1–2.5)
BUN / CREAT RATIO: 11 (ref 9–23)
BUN: 10 mg/dL (ref 6–24)
Bilirubin Total: 0.4 mg/dL (ref 0.0–1.2)
CALCIUM: 9.5 mg/dL (ref 8.7–10.2)
CO2: 23 mmol/L (ref 18–29)
CREATININE: 0.89 mg/dL (ref 0.57–1.00)
Chloride: 102 mmol/L (ref 96–106)
GFR calc Af Amer: 87 mL/min/{1.73_m2} (ref >59)
GFR, EST NON AFRICAN AMERICAN: 75 mL/min/{1.73_m2} (ref >59)
GLOBULIN, TOTAL: 2.5 g/dL (ref 1.5–4.5)
Glucose: 100 mg/dL — ABNORMAL HIGH (ref 65–99)
Potassium: 4.6 mmol/L (ref 3.5–5.2)
SODIUM: 140 mmol/L (ref 134–144)
TOTAL PROTEIN: 6.3 g/dL (ref 6.0–8.5)

## 2015-11-03 NOTE — Progress Notes (Signed)
Internal Medicine Clinic Attending  Case discussed with Dr. Jones at the time of the visit.  We reviewed the resident's history and exam and pertinent patient test results.  I agree with the assessment, diagnosis, and plan of care documented in the resident's note.  

## 2015-11-09 ENCOUNTER — Encounter: Payer: Self-pay | Admitting: *Deleted

## 2015-11-18 ENCOUNTER — Ambulatory Visit: Payer: 59

## 2015-11-24 ENCOUNTER — Ambulatory Visit
Admission: RE | Admit: 2015-11-24 | Discharge: 2015-11-24 | Disposition: A | Discharge status: Home / Self Care | Payer: 59 | Source: Ambulatory Visit | Attending: Student in an Organized Health Care Education/Training Program | Admitting: Student in an Organized Health Care Education/Training Program

## 2015-11-24 ENCOUNTER — Ambulatory Visit: Payer: 59 | Attending: Internal Medicine

## 2015-11-24 DIAGNOSIS — M25659 Stiffness of unspecified hip, not elsewhere classified: Secondary | ICD-10-CM | POA: Diagnosis present

## 2015-11-24 DIAGNOSIS — M6281 Muscle weakness (generalized): Secondary | ICD-10-CM | POA: Insufficient documentation

## 2015-11-24 DIAGNOSIS — G8929 Other chronic pain: Secondary | ICD-10-CM

## 2015-11-24 DIAGNOSIS — R293 Abnormal posture: Secondary | ICD-10-CM | POA: Diagnosis present

## 2015-11-24 DIAGNOSIS — M2560 Stiffness of unspecified joint, not elsewhere classified: Secondary | ICD-10-CM | POA: Diagnosis present

## 2015-11-24 DIAGNOSIS — M549 Dorsalgia, unspecified: Secondary | ICD-10-CM | POA: Insufficient documentation

## 2015-11-24 DIAGNOSIS — Z1231 Encounter for screening mammogram for malignant neoplasm of breast: Secondary | ICD-10-CM

## 2015-11-24 NOTE — Patient Instructions (Signed)
From cabinet hip and hamstring stretching 2x/day 2-3 reps RT and LT 30 sec or more.                                                                                                                                                                                    Sleeping on Back  Place pillow under knees. A pillow with cervical support and a roll around waist are also helpful. Copyright  VHI. All rights reserved.  Sleeping on Side Place pillow between knees. Use cervical support under neck and a roll around waist as needed. Copyright  VHI. All rights reserved.   Sleeping on Stomach   If this is the only desirable sleeping position, place pillow under lower legs, and under stomach or chest as needed.  Posture - Sitting   Sit upright, head facing forward. Try using a roll to support lower back. Keep shoulders relaxed, and avoid rounded back. Keep hips level with knees. Avoid crossing legs for long periods. Stand to Sit / Sit to Stand   To sit: Bend knees to lower self onto front edge of chair, then scoot back on seat. To stand: Reverse sequence by placing one foot forward, and scoot to front of seat. Use rocking motion to stand up.   Work Height and Reach  Ideal work height is no more than 2 to 4 inches below elbow level when standing, and at elbow level when sitting. Reaching should be limited to arm's length, with elbows slightly bent.  Bending  Bend at hips and knees, not back. Keep feet shoulder-width apart.    Posture - Standing   Good posture is important. Avoid slouching and forward head thrust. Maintain curve in low back and align ears over shoul- ders, hips over ankles.  Alternating Positions   Alternate tasks and change positions frequently to reduce fatigue and muscle tension. Take rest breaks. Computer Work   Position work to Programmer, multimedia. Use proper work and seat height. Keep shoulders back and down, wrists straight, and elbows at right angles. Use chair that  provides full back support. Add footrest and lumbar roll as needed.  Getting Into / Out of Car  Lower self onto seat, scoot back, then bring in one leg at a time. Reverse sequence to get out.  Dressing  Lie on back to pull socks or slacks over feet, or sit and bend leg while keeping back straight.    Housework - Sink  Place one foot on ledge of cabinet under sink when standing at sink for prolonged periods.   Pushing / Pulling  Pushing is preferable to pulling. Keep back in proper alignment, and use leg muscles to  do the work.  Deep Squat   Squat and lift with both arms held against upper trunk. Tighten stomach muscles without holding breath. Use smooth movements to avoid jerking.  Avoid Twisting   Avoid twisting or bending back. Pivot around using foot movements, and bend at knees if needed when reaching for articles.  Carrying Luggage   Distribute weight evenly on both sides. Use a cart whenever possible. Do not twist trunk. Move body as a unit.   Lifting Principles .Maintain proper posture and head alignment. .Slide object as close as possible before lifting. .Move obstacles out of the way. .Test before lifting; ask for help if too heavy. .Tighten stomach muscles without holding breath. .Use smooth movements; do not jerk. .Use legs to do the work, and pivot with feet. .Distribute the work load symmetrically and close to the center of trunk. .Push instead of pull whenever possible.   Ask For Help   Ask for help and delegate to others when possible. Coordinate your movements when lifting together, and maintain the low back curve.  Log Roll   Lying on back, bend left knee and place left arm across chest. Roll all in one movement to the right. Reverse to roll to the left. Always move as one unit. Housework - Sweeping  Use long-handled equipment to avoid stooping.   Housework - Wiping  Position yourself as close as possible to reach work surface. Avoid  straining your back.  Laundry - Unloading Wash   To unload small items at bottom of washer, lift leg opposite to arm being used to reach.  [place]ovan close to area to be raked. Use arm movements to do the work. Keep back straight and avoid twisting.     Cart  When reaching into cart with one arm, lift opposite leg to keep back straight.   Getting Into / Out of Bed  Lower self to lie down on one side by raising legs and lowering head at the same time. Use arms to assist moving without twisting. Bend both knees to roll onto back if desired. To sit up, start from lying on side, and use same move-ments in reverse. Housework - Vacuuming  Hold the vacuum with arm held at side. Step back and forth to move it, keeping head up. Avoid twisting.   Laundry - IT consultant so that bending and twisting can be avoided.   Laundry - Unloading Dryer  Squat down to reach into clothes dryer or use a reacher.  Gardening - Weeding / Probation officer or Kneel. Knee pads may be helpful.

## 2015-11-24 NOTE — Therapy (Addendum)
[place], [place], [address] Phone: [phone]   Fax:  [phone]  Physical Therapy Evaluation  Patient Details  Name: Kathleen Stone MRN: [id number] Date of Birth: [birth year] Referring Provider: Luanne Bras, MD  Encounter Date: 11/24/2015      PT End of Session - 11/24/15 1537    Visit Number 1   Number of Visits 12   Date for PT Re-Evaluation 01/05/16   Authorization Type [employer]   Authorization Time Period 20 visit limit   Authorization - Visit Number 1   Authorization - Number of Visits 12   PT Start Time 0340   PT Stop Time 0430   PT Time Calculation (min) 50 min   Activity Tolerance Patient tolerated treatment well   Behavior During Therapy [facility] for tasks assessed/performed      Past Medical History  Diagnosis Date  . DJD (degenerative joint disease) of cervical spine   . Neuropathy (HCC)     Thought to be 2/2 nerve compression in the C-spine  . Chronic back pain     Negative MRI of hips bilaterally, neg CT scan of A and P; MRI of T and L spine-> very mild deg changes from T4-5 and T7-8, cervical spondylotic changes at C5-6, very mild disc bulge at L2-3 without spinal stenosis, nl ESR, ANA, RF, HIV, TSH, and B12  . Obesity   . Migraine   . Tobacco abuse   . Galactorrhea of both breasts 2009    R. sided w/ benign papilloma excised in 4/09. L sided in 5/09  . Personal history of adenomatous colonic polyp     09/2007 - diminutive adenoma [name])    Past Surgical History  Procedure Laterality Date  . Colonoscopy w/ biopsies and polypectomy  09/2007    Dr. [name]. 2 small adenomatous polyps, internal hemorrhoids-Grade 1. Rec  routine colonoscopy at age 74.  Marland Kitchen Appendectomy    . Right shoulder reconstruction       after hurting it by moving heavy objecs  . R. breast central duct excision  12/2007  . Septoplasty with bilateral inferior turbinate reduction  04/2008    Dr. Lucia Gaskins    There  were no vitals filed for this visit.  Visit Diagnosis:  Chronic back pain - Plan: PT plan of care cert/re-cert  Joint stiffness of spine - Plan: PT plan of care cert/re-cert  Stiffness of hip joint, unspecified laterality - Plan: PT plan of care cert/re-cert  Abnormal posture - Plan: PT plan of care cert/re-cert  Weakness of trunk musculature - Plan: PT plan of care cert/re-cert      Subjective Assessment - 11/24/15 1545    Subjective She reports  she used to move for employment and was carrying piano down steps and other end was dropped and twisted back. She has had injection without long term benefit.   She is here as new MD suggested PT.  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   Meds don't help   Limitations --  lying on back is worse pain. She is able to work and od normal tasks but with pain   How long can you sit comfortably? 60 min   How long can you stand comfortably? 8 hours for work   How long can you walk comfortably? 2 miles at work   Diagnostic tests None   Patient Stated Goals Make pain go away.  Currently in Pain? Yes   Pain Score 8    Pain Location Back   Pain Orientation Lower  central   Pain Descriptors / Indicators Sharp;Stabbing   Pain Type Chronic pain   Pain Radiating Towards anterior thighs and tingle in bottom of feet   Pain Onset More than a month ago   Pain Frequency Constant   Aggravating Factors  lying and prolonged activity   Pain Relieving Factors nothing eases pain except changing positions for short pweriods   Multiple Pain Sites No            [facility] PT Assessment - 11/24/15 1539    Assessment   Medical Diagnosis chronic LBP   Referring Provider Luanne Bras, MD   Onset Date/Surgical Date --  years ago   Hand Dominance Right   Next MD Visit 12/06/15   Prior Therapy No for back   Precautions   Precautions None   Restrictions   Weight Bearing Restrictions No   Balance Screen   Has the  patient fallen in the past 6 months No   Has the patient had a decrease in activity level because of a fear of falling?  No   Is the patient reluctant to leave their home because of a fear of falling?  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   Prior Function   Level of Independence Independent   Cognition   Overall Cognitive Status Within Functional Limits for tasks assessed   Observation/Other Assessments   Focus on Therapeutic Outcomes (FOTO)  41% limited   Posture/Postural Control   Posture Comments Head held in mild extension, ,  RT shoulder lower than LT , pelvis level, mild shift  to LT   ROM / Strength   AROM / PROM / Strength AROM;Strength;PROM   AROM   AROM Assessment Site Lumbar   Lumbar Flexion she can touch her ankles   Lumbar Extension 32   Lumbar - Right Side Bend 21   Lumbar - Left Side Bend 15   PROM   Overall PROM Comments PROM of hips in prone were limited on RT  ER 25 degrees to 40 degrees IR  and on LT  ER 25   IR 15   Strength   Overall Strength Comments WNL needed verbal cues to max effort. Poor to fair abdominals with poor abdominal control for pelvic tilt and trasveres abdominus   Flexibility   Soft Tissue Assessment /Muscle Length yes   Hamstrings 40 degrees  RT and LT   Special Tests    Special Tests Hip Special Tests   Hip Special Tests  Marcello Moores Test;Patrick (FABER) Test;Ober's Test   Saralyn Pilar [facility]) Test   Findings Positive   Side Right;Left   Thomas Test    Findings Positive   Side Right;Left   Comments 20 degrees   Ober's Test   Findings Negative   Comments only mild end rangfe tightness   Ambulation/Gait   Gait Comments WNL                           PT Education - 11/24/15 1634    Education provided Yes   Education Details HEP, self care, POC   Person(s) Educated Patient   Methods Explanation;Tactile cues;Verbal cues;Handout;Demonstration   Comprehension  Returned demonstration;Verbalized understanding          PT Short Term Goals - 11/24/15 1637  PT SHORT TERM GOAL #1   Title Independent with inital HEP   Time 3   Period Weeks   Status New   PT SHORT TERM GOAL #2   Title PAin reported improved 30% at work   Time 3   Period Weeks   Status New   PT SHORT TERM GOAL #3   Title reports sleeping longer due to use of pillow support for sleep posture   Time 3   Period Weeks   Status New           PT Long Term Goals - 11/24/15 1639    PT LONG TERM GOAL #1   Title She will be independent with all HEP issued as of last visit   Time 6   Period Weeks   Status New   PT LONG TERM GOAL #2   Title She will report 50% or more decreased pain with work and home activity   Time 6   Period Weeks   Status New   PT LONG TERM GOAL #3   Title She wil.limprove core strength with fair abdominal control to help support back to decr pain.   Time 6   Period Weeks   Status New   PT LONG TERM GOAL #4   Title She will report able to sleep 4-5 hours without wake due to pain   Time 6   Period Weeks   Status New               Plan - 11/24/15 1635    Clinical Impression Statement Ms Karas presents with chronic back and thigh pain with stiffness of hips, hamstrings and back. She has poor core strength and pain interferes with sleep . She is able to work full time on feet but with pain. She should improve if she improves flexibility and  core strength   Pt will benefit from skilled therapeutic intervention in order to improve on the following deficits Pain;Increased muscle spasms;Decreased range of motion;Postural dysfunction;Decreased strength   Rehab Potential Good   PT Frequency 2x / week   PT Duration 6 weeks   PT Treatment/Interventions Electrical Stimulation;Moist Heat;Therapeutic activities;Therapeutic exercise;Manual techniques;Taping;Dry needling;Patient/family education;Passive range of motion   PT Next Visit Plan REview HEP ,  manual stretching /mobs hips and or back, core strength , modalities as needed   PT Home Exercise Plan hip and hamstring stretching   Consulted and Agree with Plan of Care Patient         Problem List Patient Active Problem List   Diagnosis Date Noted  . Scleral discoloration 11/01/2015  . Personal history of adenomatous colonic polyp 08/17/2012  . Syncope 07/24/2012  . Preventative health care 07/24/2012  . UTI (lower urinary tract infection) 07/24/2012  . Depression 08/23/2011  . Leg cramps 04/30/2011  . DEGENERATIVE DISC DISEASE, 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  . NEUROPATHY 07/28/2010  . UNSPECIFIED SLEEP DISTURBANCE 07/28/2010  . HEADACHE 08/04/2009  . LOW BACK PAIN SYNDROME 07/10/2007  . OBESITY NOS 01/15/2007  . LUMBAR STRAIN 01/03/2007  . TOBACCO ABUSE 10/07/2006  . MIGRAINE HEADACHE 10/04/2006    Darrel Hoover PT 11/24/2015, 4:45 PM  [facility] [address] [place], [place], [address] Phone: [phone]   Fax:  [phone]  Name: Kathleen Stone MRN: [id number] Date of Birth: 09-24-1965   PHYSICAL THERAPY DISCHARGE SUMMARY  Visits from Start of Care: 1 Current functional level related to goals / functional outcomes: Unknown as she did not return  Remaining deficits: Unknown as she did not return   Education / Equipment: A  Plan:                                                    Patient goals were not met. Patient is being discharged due to not returning since the last visit.  ?????   Darrel Hoover, PT            01/26/16                 4:25 PM

## 2015-12-06 ENCOUNTER — Ambulatory Visit (INDEPENDENT_AMBULATORY_CARE_PROVIDER_SITE_OTHER): Payer: 59 | Admitting: Internal Medicine

## 2015-12-06 ENCOUNTER — Other Ambulatory Visit: Payer: Self-pay | Admitting: Internal Medicine

## 2015-12-06 ENCOUNTER — Encounter: Payer: Self-pay | Admitting: Internal Medicine

## 2015-12-06 VITALS — BP 146/62 | HR 88 | Temp 98.1°F | Ht 66.0 in | Wt 228.3 lb

## 2015-12-06 DIAGNOSIS — Z0000 Encounter for general adult medical examination without abnormal findings: Secondary | ICD-10-CM

## 2015-12-06 DIAGNOSIS — G629 Polyneuropathy, unspecified: Secondary | ICD-10-CM

## 2015-12-06 DIAGNOSIS — M545 Low back pain: Secondary | ICD-10-CM | POA: Diagnosis not present

## 2015-12-06 DIAGNOSIS — G8929 Other chronic pain: Secondary | ICD-10-CM

## 2015-12-06 MED ORDER — GABAPENTIN 300 MG PO CAPS: 600.0000 mg | ORAL_CAPSULE | Freq: Every day | ORAL | Status: DC

## 2015-12-06 MED ORDER — CYCLOBENZAPRINE HCL 5 MG PO TABS: 5.0000 mg | ORAL_TABLET | Freq: Three times a day (TID) | ORAL | Status: DC | PRN

## 2015-12-06 NOTE — Patient Instructions (Signed)
1. Please make a follow up for 3 months unless you need to be seen sooner.   2. Please take all medications as previously prescribed with the following changes:  Start taking Neurontin 600 mg once at night.   Use Flexeril 5 mg up to three times daily ONLY AS NEEDED for muscle spasms.   We will call you regarding sports medicine referral.   3. If you have worsening of your symptoms or new symptoms arise, please call the clinic [phone]), or go to the ER immediately if symptoms are severe.  You have done a great job in taking all your medications. Please continue to do this.

## 2015-12-06 NOTE — Progress Notes (Signed)
   Subjective:   Patient ID: Kathleen Stone female   DOB: 17-Jan-1966 50 y.o.   MRN: [id number]  HPI: Kathleen Stone is a 50 y.o. female w/ PMHx of DJD, chronic back pain, and tobacco abuse, presents to the clinic today for a follow-up visit for low back pain. Patient says she has tried the Mobic which helps only slightly. She also has started physical therapy which she says has not helped, however, she has only had one visit. She describes the pain as a dull ache in the lumbar region with some burning pain in her legs. Worse at the ned of the day. Per PT notes, patient has muscle stiffness which is likely contributing to her pain, as well as intermittent spasm. No alarm symptoms.    Current Outpatient Prescriptions  Medication Sig Dispense Refill  . cyclobenzaprine (FLEXERIL) 5 MG tablet Take 1 tablet (5 mg total) by mouth 3 (three) times daily as needed for muscle spasms. 30 tablet 1  . gabapentin (NEURONTIN) 300 MG capsule Take 2 capsules (600 mg total) by mouth at bedtime. 60 capsule 5  . meloxicam (MOBIC) 7.5 MG tablet TAKE ONE TABLET BY MOUTH ONCE DAILY 30 tablet 1   No current facility-administered medications for this visit.    Review of Systems: General: Denies fever, chills, diaphoresis, appetite change and fatigue.  Respiratory: Denies SOB, DOE, cough, and wheezing.   Cardiovascular: Denies chest pain and palpitations.  Gastrointestinal: Denies nausea, vomiting, abdominal pain, and diarrhea.  Genitourinary: Denies dysuria, increased frequency, and flank pain. Endocrine: Denies hot or cold intolerance, polyuria, and polydipsia. Musculoskeletal: Positive for lower back pain. Denies myalgias, joint swelling, arthralgias and gait problem.  Skin: Denies pallor, rash and wounds.  Neurological: Denies dizziness, seizures, syncope, weakness, lightheadedness, numbness and headaches.  Psychiatric/Behavioral: Denies mood changes, and sleep disturbances.  Objective:   Physical  Exam: Filed Vitals:   12/06/15 1544  BP: 146/62  Pulse: 88  Temp: 98.1 F (36.7 C)  TempSrc: Oral  Height: 5\' 6"  (1.676 m)  Weight: 228 lb 4.8 oz (103.556 kg)  SpO2: 100%    General: Overweight AA female, alert, cooperative, NAD. HEENT: PERRL, EOMI. Moist mucus membranes.  Neck: Full range of motion without pain, supple, no lymphadenopathy or carotid bruits Lungs: Clear to ascultation bilaterally, normal work of respiration, no wheezes, rales, rhonchi Heart: RRR, no murmurs, gallops, or rubs Abdomen: Soft, non-tender, non-distended, BS + Extremities: No cyanosis, clubbing, or edema. Negative straight leg raise bilaterally. Mild tenderness over the lumbar spine, mostly paraspinal tenderness. No hip pain.  Neurologic: Alert & oriented X3, cranial nerves II-XII intact, strength grossly intact, sensation intact to light touch   Assessment & Plan:   Please see problem based assessment and plan.

## 2015-12-07 ENCOUNTER — Encounter: Payer: 59 | Admitting: Physical Therapy

## 2015-12-07 NOTE — Assessment & Plan Note (Signed)
Mammo negative

## 2015-12-07 NOTE — Assessment & Plan Note (Signed)
Patient with continued low back pain. Dull ache, mostly paraspinal in nature, as well as some burning in the thighs and down the leg, as well as some back spasms as well. Negative straight leg raise bilaterally. Exam fairly benign aside from paraspinal tenderness. Seems to have significant muscle tightness (also suggested by PT).  -Continue PT -Continue Mobic intermittently for back pain -Start Flexeril 5 mg tid prn for spasm -Restart Neurontin for radiculopathy symptoms; 600 mg qhs -Referral to sports medicine -RTC in 3 months

## 2015-12-08 ENCOUNTER — Ambulatory Visit: Payer: 59 | Admitting: Physical Therapy

## 2015-12-08 NOTE — Progress Notes (Signed)
Internal Medicine Clinic Attending  Case discussed with Dr. Jones soon after the resident saw the patient.  We reviewed the resident's history and exam and pertinent patient test results.  I agree with the assessment, diagnosis, and plan of care documented in the resident's note. 

## 2015-12-13 ENCOUNTER — Ambulatory Visit: Payer: 59

## 2015-12-19 ENCOUNTER — Encounter: Payer: 59 | Admitting: Physical Therapy

## 2015-12-21 ENCOUNTER — Ambulatory Visit: Payer: 59 | Admitting: Family Medicine

## 2016-01-04 ENCOUNTER — Ambulatory Visit (INDEPENDENT_AMBULATORY_CARE_PROVIDER_SITE_OTHER): Payer: 59 | Admitting: Family Medicine

## 2016-01-04 ENCOUNTER — Encounter: Payer: Self-pay | Admitting: Family Medicine

## 2016-01-04 VITALS — BP 157/72 | Ht 66.0 in | Wt 215.0 lb

## 2016-01-04 DIAGNOSIS — M5416 Radiculopathy, lumbar region: Secondary | ICD-10-CM | POA: Diagnosis not present

## 2016-01-04 MED ORDER — PREDNISONE 10 MG (21) PO TBPK: ORAL_TABLET | ORAL | Status: DC

## 2016-01-04 NOTE — Progress Notes (Signed)
Kathleen Stone - 50 y.o. female MRN [id number]  Date of birth: 17-Jan-1966  Kathleen Stone is a 50 y.o. who presents today for low back pain and sciatica.  Lumbar radiculopathy, initial visit 01/04/16 - Pt presents with ongoing low back pain for the past 10-15 years without inciting injury.  Previous imaging including x-rays as well as MRI showing DJD/DDD at L4/L5/S1.  Previously has had epidural injections x 3 back in 2010ish that did not seem to help.  She has recently seen her PCP which gave her mobic/flexeril/gabapentin which has had minimal relief.  Pt denies any current bowel/bladder problems, fever, chills, unintentional weight loss, night time awakenings secondary to pain, weakness in one or both legs.     Past Medical History  Diagnosis Date  . DJD (degenerative joint disease) of cervical spine   . Neuropathy (HCC)     Thought to be 2/2 nerve compression in the C-spine  . Chronic back pain     Negative MRI of hips bilaterally, neg CT scan of A and P; MRI of T and L spine-> very mild deg changes from T4-5 and T7-8, cervical spondylotic changes at C5-6, very mild disc bulge at L2-3 without spinal stenosis, nl ESR, ANA, RF, HIV, TSH, and B12  . Obesity   . Migraine   . Tobacco abuse   . Galactorrhea of both breasts 2009    R. sided w/ benign papilloma excised in 4/09. L sided in 5/09  . Personal history of adenomatous colonic polyp     09/2007 - diminutive adenoma Kathleen Stone)  ,  Family History  Problem Relation Age of Onset  . Heart failure Mother   . Diabetes Mother   . Breast cancer      aunt  . Liver cancer      aunt  ,  Social History   Social History  . Marital Status: Single    Spouse Name: N/A  . Number of Children: N/A  . Years of Education: N/A   Occupational History  . Not on file.   Social History Main Topics  . Smoking status: Current Every Day Smoker -- 0.50 packs/day    Types: Cigarettes  . Smokeless tobacco: Not on file     Comment: started at age  56.  Cutting back  . Alcohol Use: Not on file  . Drug Use: No  . Sexual Activity: Not on file   Other Topics Concern  . Not on file   Social History Narrative   Occupation: [employer] part time   Domestic Partner: female has emphysema   Smoked 2 packs/day for 20+ years, down to 1/2ppd now, willing to try to quit    Alcohol use-no   Drug use-no   Regular exercise-no  ,  Past Surgical History  Procedure Laterality Date  . Colonoscopy w/ biopsies and polypectomy  09/2007    Dr. Carlean Stone. 2 small adenomatous polyps, internal hemorrhoids-Grade 1. Rec  routine colonoscopy at age 11.  Kathleen Stone Appendectomy    . Right shoulder reconstruction       after hurting it by moving heavy objecs  . R. breast central duct excision  12/2007  . Septoplasty with bilateral inferior turbinate reduction  04/2008    Dr. Lucia Gaskins    12 point ROS negative other than per HPI.   Physical Exam Filed Vitals:   01/04/16 1541  BP: 157/72    Gen: NAD, AAO 3 Cardio- RRR Pulm - Normal respiratory effort/rate Skin: No rashes or erythema  Extremities: No edema  Vascular: pulses +2 bilateral upper and lower extremity Psych: Normal affect  Neuro: CN 2-12 intact, MS 5/5 B/L UE and LE Back Exam: 1.Gait  1. Walk on heels (L5 root)  Normal   Walk on toes (S1 root)  Normal  2. TTP along Lumbar Vertebrae - Negative  3. Pain with :   1) Extension -Negative   2) Flexion - Negative  4. One Legged Hyperextension for Spondy - Negative  5. Straight Leg Raise - Positive @ 45  1) Radiation into opposite leg - Negative   2) Worse with Dorsiflexion of ankle - Negative  6. Sitting Leg Raise - Positive @ 45 7. DTR - +2/4 Patellar/Achilles 8. MS - 5/5 L2-S1 myotome strength B/L  9. Vascular Exam : DP and PT +2 B/L

## 2016-01-04 NOTE — Assessment & Plan Note (Signed)
MRI from 2008 showing degenerative joint disease L4/L5/S1 and now pt having ongoing sciatica which I would expect her DJD/DDD in her lumbar spine has progressed. - Start with two view x-ray of spine - Progress to MRI to evaluate the extent of her progressive disease which is affecting her daily life  - Prednisone dose pack today, increase neurontin as tolerates. - F/U after MRI to discuss options including PT along with epidural vs facet injections vs neurosurgery evaluation.

## 2016-01-10 ENCOUNTER — Other Ambulatory Visit: Payer: Self-pay | Admitting: Sports Medicine

## 2016-01-11 ENCOUNTER — Other Ambulatory Visit: Payer: 59

## 2016-01-17 ENCOUNTER — Ambulatory Visit
Admission: RE | Admit: 2016-01-17 | Discharge: 2016-01-17 | Disposition: A | Discharge status: Home / Self Care | Payer: 59 | Source: Ambulatory Visit | Attending: Family Medicine | Admitting: Family Medicine

## 2016-01-17 DIAGNOSIS — M5416 Radiculopathy, lumbar region: Secondary | ICD-10-CM

## 2016-01-18 ENCOUNTER — Other Ambulatory Visit: Payer: Self-pay | Admitting: *Deleted

## 2016-01-18 ENCOUNTER — Telehealth: Payer: Self-pay | Admitting: Family Medicine

## 2016-01-18 DIAGNOSIS — M5416 Radiculopathy, lumbar region: Secondary | ICD-10-CM

## 2016-01-18 NOTE — Telephone Encounter (Signed)
Discussed results with the pt.  She is amenable to a series of epidural injections, in which if they do not work, she will discuss with neurosurgery for possible discectomy.   Tamela Oddi Awanda Mink, DO of [facility] 01/18/2016, 1:42 PM

## 2016-01-19 ENCOUNTER — Other Ambulatory Visit: Payer: Self-pay | Admitting: Family Medicine

## 2016-01-19 DIAGNOSIS — M545 Low back pain: Principal | ICD-10-CM

## 2016-01-19 DIAGNOSIS — G8929 Other chronic pain: Secondary | ICD-10-CM

## 2016-02-03 ENCOUNTER — Ambulatory Visit
Admission: RE | Admit: 2016-02-03 | Discharge: 2016-02-03 | Disposition: A | Discharge status: Home / Self Care | Payer: 59 | Source: Ambulatory Visit | Attending: Family Medicine | Admitting: Family Medicine

## 2016-02-03 ENCOUNTER — Telehealth: Payer: Self-pay

## 2016-02-03 DIAGNOSIS — G8929 Other chronic pain: Secondary | ICD-10-CM

## 2016-02-03 DIAGNOSIS — M545 Low back pain: Principal | ICD-10-CM

## 2016-02-03 MED ORDER — METHYLPREDNISOLONE ACETATE 40 MG/ML INJ SUSP (RADIOLOG
120.0000 mg | Freq: Once | INTRAMUSCULAR | Status: AC
  Administered 2016-02-03: 120 mg via EPIDURAL

## 2016-02-03 MED ORDER — IOPAMIDOL (ISOVUE-M 200) INJECTION 41%
1.0000 mL | Freq: Once | INTRAMUSCULAR | Status: AC
  Administered 2016-02-03: 1 mL via EPIDURAL

## 2016-02-03 NOTE — Telephone Encounter (Signed)
Called pt back got vmail, lm for rtc

## 2016-02-03 NOTE — Telephone Encounter (Signed)
Please call pt back regarding flexeril.

## 2016-02-03 NOTE — Discharge Instructions (Signed)

## 2016-02-14 ENCOUNTER — Encounter: Payer: 59 | Admitting: Internal Medicine

## 2016-02-16 ENCOUNTER — Encounter: Payer: Self-pay | Admitting: Internal Medicine

## 2016-02-16 ENCOUNTER — Ambulatory Visit (HOSPITAL_COMMUNITY)
Admission: RE | Admit: 2016-02-16 | Discharge: 2016-02-16 | Disposition: A | Discharge status: Home / Self Care | Payer: 59 | Source: Ambulatory Visit | Attending: Student in an Organized Health Care Education/Training Program | Admitting: Student in an Organized Health Care Education/Training Program

## 2016-02-16 ENCOUNTER — Ambulatory Visit (INDEPENDENT_AMBULATORY_CARE_PROVIDER_SITE_OTHER): Payer: 59 | Admitting: Internal Medicine

## 2016-02-16 VITALS — BP 158/61 | HR 87 | Temp 97.7°F | Ht 66.0 in | Wt 240.8 lb

## 2016-02-16 DIAGNOSIS — M25461 Effusion, right knee: Secondary | ICD-10-CM | POA: Diagnosis not present

## 2016-02-16 DIAGNOSIS — M25561 Pain in right knee: Secondary | ICD-10-CM | POA: Insufficient documentation

## 2016-02-16 NOTE — Progress Notes (Signed)
Subjective:    Patient ID: Kathleen Stone, female    DOB: [birth year], 50 y.o.   MRN: [id number]  HPI Comments: Ms. Hulick is a 50 year old woman with PMH as below here with complaint of right knee pain x 6 days.  She woke Saturday morning with swollen and painful knee.  She denies preceding trauma, overuse, hx of knee problems/injury or gout.  The knee sometimes feels as if it may give out but not locking.  swelling has responded to ice.  Prn NSAIDs have provided little relief.  She is able to walk.  She has no systemic symptoms.     Past Medical History  Diagnosis Date  . DJD (degenerative joint disease) of cervical spine   . Neuropathy (HCC)     Thought to be 2/2 nerve compression in the C-spine  . Chronic back pain     Negative MRI of hips bilaterally, neg CT scan of A and P; MRI of T and L spine-> very mild deg changes from T4-5 and T7-8, cervical spondylotic changes at C5-6, very mild disc bulge at L2-3 without spinal stenosis, nl ESR, ANA, RF, HIV, TSH, and B12  . Obesity   . Migraine   . Tobacco abuse   . Galactorrhea of both breasts 2009    R. sided w/ benign papilloma excised in 4/09. L sided in 5/09  . Personal history of adenomatous colonic polyp     09/2007 - diminutive adenoma [name])   Current Outpatient Prescriptions on File Prior to Visit  Medication Sig Dispense Refill  . cyclobenzaprine (FLEXERIL) 5 MG tablet Take 1 tablet (5 mg total) by mouth 3 (three) times daily as needed for muscle spasms. 30 tablet 1  . gabapentin (NEURONTIN) 300 MG capsule Take 2 capsules (600 mg total) by mouth at bedtime. 60 capsule 5  . meloxicam (MOBIC) 7.5 MG tablet TAKE ONE TABLET BY MOUTH ONCE DAILY 30 tablet 1  . predniSONE (STERAPRED UNI-PAK 21 TAB) 10 MG (21) TBPK tablet As directed 21 tablet 0   No current facility-administered medications on file prior to visit.    Review of Systems  Constitutional: Negative for fever, chills and appetite change.  Gastrointestinal: Negative  for nausea and vomiting.  Musculoskeletal:       Right knee pain and swelling.       Filed Vitals:   02/16/16 1535  BP: 158/61  Pulse: 87  Temp: 97.7 F (36.5 C)  TempSrc: Oral  Height: _0  (1.676 m)  Weight: 240 lb 12.8 oz (109.226 kg)  SpO2: 99%    Objective:   Physical Exam  Constitutional: She is oriented to person, place, and time. She appears well-developed. No distress.  HENT:  Head: Normocephalic and atraumatic.  Neck: Neck supple.  Cardiovascular: Normal rate, regular rhythm and normal heart sounds.  Exam reveals no gallop and no friction rub.   No murmur heard. Pulmonary/Chest: Effort normal and breath sounds normal. No respiratory distress. She has no wheezes. She has no rales.  Abdominal: Soft. She exhibits no distension. There is no tenderness.  Musculoskeletal: Normal range of motion. She exhibits edema and tenderness.  Skin overlying the right knee is normal color and temp compared to left knee.  There is a small right knee effusion.  There is no crepitus.  Knee ROM is smooth but does elicit pain.  McMurray - technically difficult due to body habitus, but seems negative.  There is discomfort with valgus/varus stress.  Neurological: She is alert  and oriented to person, place, and time.  Skin: Skin is warm and dry. She is not diaphoretic.  Psychiatric: She has a normal mood and affect. Her behavior is normal. Judgment and thought content normal.  Vitals reviewed.         Assessment & Plan:  Please see problem based charting for A&P.

## 2016-02-16 NOTE — Patient Instructions (Addendum)
1. Please try ice (wrap in a towel), NSAIDS as needed (take with food), compression sleeve for your knee pain.  Try to avoid activities that cause pain but try to keep moving.    You can try over-the-counter ibuprofen 600mg  up to three times daily as needed for the next week.   2. Please take all medications as prescribed.    3. If you have worsening of your symptoms or new symptoms arise, please call the clinic [phone]), or go to the ER immediately if symptoms are severe.

## 2016-02-16 NOTE — Assessment & Plan Note (Addendum)
Assessment:  Swelling and pain of right knee x 6 days.  No acute injury or trauma.  No catching but she feels as if knee may give out at times.  She is able to walk.  Ice has worked for swelling.  OTC NSAID not helping. No redness, warmth, systemic symptoms, prior gout hx or joint replacement hx to suggest gout or septic joint.  This is likely meniscal injury in the setting of underlying OA given no preceding trauma/overuse.   Plan:  Conservative care - continue ice pack 10 minutes at a time a few time per day, ADD ibuprofen 600mg  TID x 1 week, avoid activities that worsen pain but continue to do normal routine.  Will obtain xrays of right knee to look for underlying OA.  Call clinic or to ED with redness, severe pain, systemic symptoms.  RTC in 2 weeks if swelling still present to consider additional trx such as steroid or consider MRI if needed.

## 2016-02-20 NOTE — Progress Notes (Signed)
Internal Medicine Clinic Attending  I saw and evaluated the patient.  I personally confirmed the key portions of the history and exam documented by Dr. Redmond Pulling and I reviewed pertinent patient test results.  The assessment, diagnosis, and plan were formulated together and I agree with the documentation in the resident's note.  50 year old woman with painful right knee. Clinical course seems consistent with flare of osteoarthritis. She is overweight, imaging shows moderate medial joint space narrowing. Other differential includes gout or CPPD. I advised conservative trial with high dose NSAIDs for at least one week. If the pain returns or does not improve, she should follow in our clinic for arthrocentesis to rule out crystal arthropathy.

## 2016-02-27 ENCOUNTER — Encounter: Payer: Self-pay | Admitting: *Deleted

## 2016-03-12 ENCOUNTER — Telehealth: Payer: Self-pay | Admitting: *Deleted

## 2016-03-12 NOTE — Telephone Encounter (Signed)
Call from patient.  Wanted to know if she needed to return here for another appointment before needing to be referred for her leg pain.  Stated that she has a $30.00 co-pay each visit here and wanted to know if she needed to come back to the Clinics first.  Also stated had talked with doctor about Chantix and has not gotten a prescription for the Chantix.  Sander Nephew, RN 03/12/2016 1:15 PM

## 2016-03-12 NOTE — Telephone Encounter (Signed)
Smoking was addressed in Feb but no documentation of chantix.  The next step for knee pain was an arthrocentesis per note. She should sch an appt when [place], or [place] are attending. Pls put procedure in reason for appt.  She can discuss chantix at next appt.

## 2016-03-16 ENCOUNTER — Other Ambulatory Visit: Payer: Self-pay | Admitting: *Deleted

## 2016-03-16 DIAGNOSIS — M545 Low back pain: Principal | ICD-10-CM

## 2016-03-16 DIAGNOSIS — G8929 Other chronic pain: Secondary | ICD-10-CM

## 2016-03-16 MED ORDER — GABAPENTIN 300 MG PO CAPS: 600.0000 mg | ORAL_CAPSULE | Freq: Every day | ORAL | Status: DC

## 2016-03-16 MED ORDER — CYCLOBENZAPRINE HCL 5 MG PO TABS: 5.0000 mg | ORAL_TABLET | Freq: Three times a day (TID) | ORAL | Status: DC | PRN

## 2016-03-16 NOTE — Telephone Encounter (Signed)
If she is still having knee pain, she should sch an appt when Kathleen Stone, or Kathleen Stone are attending. Pls put procedure in reason for appt.

## 2016-03-16 NOTE — Telephone Encounter (Signed)
New pcp not assigned yet- will send to attending pool for review. Please advise.Regenia Skeeter, Kamilla Hands Cassady6/30/20173:50 PM

## 2016-06-06 ENCOUNTER — Other Ambulatory Visit: Payer: Self-pay | Admitting: *Deleted

## 2016-06-06 DIAGNOSIS — G8929 Other chronic pain: Secondary | ICD-10-CM

## 2016-06-06 DIAGNOSIS — M545 Low back pain, unspecified: Secondary | ICD-10-CM

## 2016-06-07 MED ORDER — GABAPENTIN 300 MG PO CAPS: 600.0000 mg | ORAL_CAPSULE | Freq: Every day | ORAL | 2 refills | Status: DC

## 2016-06-13 ENCOUNTER — Encounter: Payer: Self-pay | Admitting: Internal Medicine

## 2016-06-13 ENCOUNTER — Ambulatory Visit (INDEPENDENT_AMBULATORY_CARE_PROVIDER_SITE_OTHER): Payer: 59 | Admitting: Internal Medicine

## 2016-06-13 VITALS — BP 153/66 | HR 72 | Temp 98.0°F | Wt 248.2 lb

## 2016-06-13 DIAGNOSIS — F17200 Nicotine dependence, unspecified, uncomplicated: Secondary | ICD-10-CM

## 2016-06-13 DIAGNOSIS — G8929 Other chronic pain: Secondary | ICD-10-CM | POA: Diagnosis not present

## 2016-06-13 DIAGNOSIS — M5126 Other intervertebral disc displacement, lumbar region: Secondary | ICD-10-CM

## 2016-06-13 DIAGNOSIS — I10 Essential (primary) hypertension: Secondary | ICD-10-CM | POA: Diagnosis not present

## 2016-06-13 DIAGNOSIS — M5416 Radiculopathy, lumbar region: Secondary | ICD-10-CM

## 2016-06-13 DIAGNOSIS — F17210 Nicotine dependence, cigarettes, uncomplicated: Secondary | ICD-10-CM

## 2016-06-13 MED ORDER — AMLODIPINE BESYLATE 5 MG PO TABS: 5.0000 mg | ORAL_TABLET | Freq: Every day | ORAL | 2 refills | Status: DC

## 2016-06-13 MED ORDER — PREGABALIN 75 MG PO CAPS: 75.0000 mg | ORAL_CAPSULE | Freq: Two times a day (BID) | ORAL | 0 refills | Status: DC

## 2016-06-13 NOTE — Assessment & Plan Note (Signed)
A: MRI from May 2017 shows new disc bulge at L3-L4.  She has failed more conservative therapy with epidural steroid injection and gabapentin was self-discontinued to due ineffectiveness and making her feel drowsy.  She desires neurosurgery evaluation per previous discussion with her sports medicine provider.  She states having been to PT previously and declines referral at this time.  P: - given that she has failed more conservative therapies and the significant impact this appears to be having on her quality of life, we will refer her to neurosurgery - discontinue gabapentin - trial of pregabalin 75mg  BID with ability to titrate up as needed. - RTC in 2 weeks.

## 2016-06-13 NOTE — Progress Notes (Signed)
CC: here for bilateral leg pain  HPI:  [name] is a 50 y.o. woman with a past medical history listed below here today for follow up of her bilateral leg pain as a result of her lumbar disc disease.  She had an MRI in 2008 with degenerative disease at L4-S1.  She was seen by sports medicine in April 2017 and noted to be having sciatic symptoms.  She had an MRI that showed a new disc bulge at L3-L4 with neural foraminal stenosis.  Sports medicine increased her gabapentin to 62m QHS.  This made her feel too sleepy and was ineffective so she stopped it.  She also does not feel comfortable taking it during the day due to sleepiness because of her job requirements.  She also had a epidural steroid injection with IR on May 19.  She reports this provided no relief.  She is wanting to be seen by neurosurgery as this has been discussed with her previously by her sports medicine doctor.  She reports the pain is significantly impacting her quality of life at this point.  She has exacerbation of symptoms with lying flat described as sharp, shooting pain.  Also occurs with sitting.  Feels "like people running through my legs".  She denies incontinence, weakness, or saddle anesthesia symptoms.  For details of today's visit and the status of her chronic medical issues please refer to the assessment and plan.   Past Medical History:  Diagnosis Date  . Chronic back pain    Negative MRI of hips bilaterally, neg CT scan of A and P; MRI of T and L spine-> very mild deg changes from T4-5 and T7-8, cervical spondylotic changes at C5-6, very mild disc bulge at L2-3 without spinal stenosis, nl ESR, ANA, RF, HIV, TSH, and B12  . DJD (degenerative joint disease) of cervical spine   . Galactorrhea of both breasts 2009   R. sided w/ benign papilloma excised in 4/09. L sided in 5/09  . Migraine   . Neuropathy (HCC)    Thought to be 2/2 nerve compression in the C-spine  . Obesity   . Personal history of  adenomatous colonic polyp    09/2007 - diminutive adenoma ([name]  . Tobacco abuse     Review of Systems:   Please see pertinent ROS reviewed in HPI and problem based charting.   Physical Exam:  Vitals:   06/13/16 1346  BP: (!) 153/66  Pulse: 72  Temp: 98 F (36.7 C)  TempSrc: Oral  SpO2: 99%  Weight: 248 lb 3.2 oz (112.6 kg)   Physical Exam  Constitutional: She is oriented to person, place, and time and well-developed, well-nourished, and in no distress.  HENT:  Head: Normocephalic and atraumatic.  Pulmonary/Chest: Effort normal.  Musculoskeletal: She exhibits no edema or deformity.  Neurological: She is alert and oriented to person, place, and time. No cranial nerve deficit. Gait normal.     Assessment & Plan:   See Encounters Tab for problem based charting.  Patient discussed with Dr. MDaryll Drown  Lumbar radiculopathy, chronic A: MRI from May 2017 shows new disc bulge at L3-L4.  She has failed more conservative therapy with epidural steroid injection and gabapentin was self-discontinued to due ineffectiveness and making her feel drowsy.  She desires neurosurgery evaluation per previous discussion with her sports medicine provider.  She states having been to PT previously and declines referral at this time.  P: - given that she has failed more conservative therapies and  the significant impact this appears to be having on her quality of life, we will refer her to neurosurgery - discontinue gabapentin - trial of pregabalin 33m BID with ability to titrate up as needed. - RTC in 2 weeks.  Essential hypertension BP Readings from Last 3 Encounters:  06/13/16 (!) 153/66  02/16/16 (!) 158/61  02/03/16 (!) 158/62   A:  Her BP is elevated and is consistently high on prior readings.  She is not on any anti-hypertensive medications.  BP could be elevated secondary to pain but likely warrants treatment at this point regardless.  She does not report headaches, blurry vision or  chest pain.  We discussed lifestyle changes that will benefit her blood pressure, in particular, weight loss and quitting smoking.  Body mass index is 40.06 kg/m. She smokes 1/2 to 1 PPD.  We also discussed low-sodium diet.  P: - we will initiate amlodipine 587mdaily - RTC 2 weeks for recheck  TOBACCO ABUSE A: Smoking about 1/2 to 1 pack per day.  She is not interested in quitting at this time but we discussed the importance of doing so in terms of controlling her blood pressure and overall health risk going forward.  P: - continue addressing at follow up.

## 2016-06-13 NOTE — Assessment & Plan Note (Addendum)
BP Readings from Last 3 Encounters:  06/13/16 (!) 153/66  02/16/16 (!) 158/61  02/03/16 (!) 158/62   A:  Her BP is elevated and is consistently high on prior readings.  She is not on any anti-hypertensive medications.  BP could be elevated secondary to pain but likely warrants treatment at this point regardless.  She does not report headaches, blurry vision or chest pain.  We discussed lifestyle changes that will benefit her blood pressure, in particular, weight loss and quitting smoking.  Body mass index is 40.06 kg/m. She smokes 1/2 to 1 PPD.  We also discussed low-sodium diet.  P: - we will initiate amlodipine 5mg  daily - RTC 2 weeks for recheck

## 2016-06-13 NOTE — Assessment & Plan Note (Signed)
A: Smoking about 1/2 to 1 pack per day.  She is not interested in quitting at this time but we discussed the importance of doing so in terms of controlling her blood pressure and overall health risk going forward.  P: - continue addressing at follow up.

## 2016-06-13 NOTE — Patient Instructions (Signed)
Thank you for coming to see me today. It was a pleasure. Today we talked about:   Back pain: - I have referred you to neurosurgery for evaluation - I have started Lyrica 75mg  twice per day.  This medication may be increased if needed.  High Blood Pressure: - I have started a medication called amlodipine 5mg  daily to help control your BP.  Please follow-up with [place] in 2 weeks for back pain and blood pressure.  If you have any questions or concerns, please do not hesitate to call the office at [phone].  Take Care,   Jule Ser, DO

## 2016-06-15 ENCOUNTER — Telehealth: Payer: Self-pay | Admitting: *Deleted

## 2016-06-15 NOTE — Telephone Encounter (Signed)
Received PA request from pt's pharmacy for pt's Lyrica-Pt has failed gabapentin.  Request submitted online via Cover My Meds.  Request sent for "review".Kathleen Hidden Cassady9/29/20172:54 PM

## 2016-06-25 NOTE — Telephone Encounter (Signed)
Request approved

## 2016-06-26 NOTE — Progress Notes (Signed)
Internal Medicine Clinic Attending  Case discussed with Dr. Wallace soon after the resident saw the patient.  We reviewed the resident's history and exam and pertinent patient test results.  I agree with the assessment, diagnosis, and plan of care documented in the resident's note. 

## 2016-08-03 ENCOUNTER — Ambulatory Visit: Payer: 59

## 2016-11-15 ENCOUNTER — Observation Stay (HOSPITAL_COMMUNITY)
Admission: EM | Admit: 2016-11-15 | Discharge: 2016-11-16 | Disposition: A | Discharge status: Home / Self Care | Payer: 59 | Attending: Student in an Organized Health Care Education/Training Program | Admitting: Student in an Organized Health Care Education/Training Program

## 2016-11-15 ENCOUNTER — Emergency Department (HOSPITAL_COMMUNITY): Payer: 59

## 2016-11-15 ENCOUNTER — Encounter (HOSPITAL_COMMUNITY): Payer: Self-pay

## 2016-11-15 DIAGNOSIS — Z833 Family history of diabetes mellitus: Secondary | ICD-10-CM | POA: Diagnosis not present

## 2016-11-15 DIAGNOSIS — Z720 Tobacco use: Secondary | ICD-10-CM | POA: Diagnosis present

## 2016-11-15 DIAGNOSIS — Z91041 Radiographic dye allergy status: Secondary | ICD-10-CM

## 2016-11-15 DIAGNOSIS — R0789 Other chest pain: Secondary | ICD-10-CM | POA: Diagnosis not present

## 2016-11-15 DIAGNOSIS — R202 Paresthesia of skin: Secondary | ICD-10-CM

## 2016-11-15 DIAGNOSIS — Z8249 Family history of ischemic heart disease and other diseases of the circulatory system: Secondary | ICD-10-CM

## 2016-11-15 DIAGNOSIS — R079 Chest pain, unspecified: Secondary | ICD-10-CM

## 2016-11-15 DIAGNOSIS — I10 Essential (primary) hypertension: Secondary | ICD-10-CM | POA: Diagnosis not present

## 2016-11-15 DIAGNOSIS — F17210 Nicotine dependence, cigarettes, uncomplicated: Secondary | ICD-10-CM | POA: Insufficient documentation

## 2016-11-15 DIAGNOSIS — F17200 Nicotine dependence, unspecified, uncomplicated: Secondary | ICD-10-CM | POA: Diagnosis present

## 2016-11-15 DIAGNOSIS — E669 Obesity, unspecified: Secondary | ICD-10-CM

## 2016-11-15 HISTORY — DX: Essential (primary) hypertension: I10

## 2016-11-15 HISTORY — DX: Family history of other specified conditions: Z84.89

## 2016-11-15 HISTORY — DX: Low back pain, unspecified: M54.50

## 2016-11-15 HISTORY — DX: Low back pain: M54.5

## 2016-11-15 HISTORY — DX: Other specified postprocedural states: R11.2

## 2016-11-15 HISTORY — DX: Other chronic pain: G89.29

## 2016-11-15 HISTORY — DX: Other specified postprocedural states: Z98.890

## 2016-11-15 LAB — BASIC METABOLIC PANEL
Anion gap: 10 (ref 5–15)
BUN: 16 mg/dL (ref 6–20)
CALCIUM: 9.3 mg/dL (ref 8.9–10.3)
CO2: 23 mmol/L (ref 22–32)
Chloride: 107 mmol/L (ref 101–111)
Creatinine, Ser: 1.03 mg/dL — ABNORMAL HIGH (ref 0.44–1.00)
GFR calc Af Amer: >60 mL/min (ref >60)
GLUCOSE: 95 mg/dL (ref 65–99)
Potassium: 4.4 mmol/L (ref 3.5–5.1)
Sodium: 140 mmol/L (ref 135–145)

## 2016-11-15 LAB — CBC
HCT: 43.4 % (ref 36.0–46.0)
Hemoglobin: 14.2 g/dL (ref 12.0–15.0)
MCH: 28.9 pg (ref 26.0–34.0)
MCHC: 32.7 g/dL (ref 30.0–36.0)
MCV: 88.2 fL (ref 78.0–100.0)
Platelets: 248 10*3/uL (ref 150–400)
RBC: 4.92 MIL/uL (ref 3.87–5.11)
RDW: 15.3 % (ref 11.5–15.5)
WBC: 9.9 10*3/uL (ref 4.0–10.5)

## 2016-11-15 LAB — I-STAT TROPONIN, ED
Troponin i, poc: 0 ng/mL (ref 0.00–0.08)
Troponin i, poc: 0.01 ng/mL (ref 0.00–0.08)

## 2016-11-15 LAB — TROPONIN I: Troponin I: <0.03 ng/mL (ref <0.03)

## 2016-11-15 LAB — D-DIMER, QUANTITATIVE: D-Dimer, Quant: <0.27 ug/mL-FEU (ref 0.00–0.50)

## 2016-11-15 MED ORDER — KETOROLAC TROMETHAMINE 30 MG/ML IJ SOLN
30.0000 mg | Freq: Once | INTRAMUSCULAR | Status: AC
  Administered 2016-11-15: 30 mg via INTRAVENOUS
  Filled 2016-11-15: qty 1

## 2016-11-15 MED ORDER — DICLOFENAC SODIUM 1 % TD GEL
2.0000 g | Freq: Four times a day (QID) | TRANSDERMAL | Status: DC
  Administered 2016-11-15 – 2016-11-16 (×2): 2 g via TOPICAL
  Filled 2016-11-15: qty 100

## 2016-11-15 MED ORDER — NICOTINE 14 MG/24HR TD PT24
14.0000 mg | MEDICATED_PATCH | Freq: Every day | TRANSDERMAL | Status: DC
  Administered 2016-11-15 – 2016-11-16 (×2): 14 mg via TRANSDERMAL
  Filled 2016-11-15 (×2): qty 1

## 2016-11-15 MED ORDER — ASPIRIN 81 MG PO CHEW
324.0000 mg | CHEWABLE_TABLET | Freq: Once | ORAL | Status: AC
  Administered 2016-11-15: 324 mg via ORAL
  Filled 2016-11-15: qty 4

## 2016-11-15 MED ORDER — NITROGLYCERIN 0.4 MG SL SUBL
0.4000 mg | SUBLINGUAL_TABLET | SUBLINGUAL | Status: DC | PRN
  Administered 2016-11-15 (×2): 0.4 mg via SUBLINGUAL

## 2016-11-15 MED ORDER — PNEUMOCOCCAL VAC POLYVALENT 25 MCG/0.5ML IJ INJ
0.5000 mL | INJECTION | INTRAMUSCULAR | Status: AC
  Administered 2016-11-16: 0.5 mL via INTRAMUSCULAR
  Filled 2016-11-15 (×2): qty 0.5

## 2016-11-15 MED ORDER — INFLUENZA VAC SPLIT QUAD 0.5 ML IM SUSY
0.5000 mL | PREFILLED_SYRINGE | INTRAMUSCULAR | Status: DC
  Filled 2016-11-15: qty 0.5

## 2016-11-15 MED ORDER — ENOXAPARIN SODIUM 40 MG/0.4ML ~~LOC~~ SOLN
40.0000 mg | SUBCUTANEOUS | Status: DC
  Administered 2016-11-15: 40 mg via SUBCUTANEOUS
  Filled 2016-11-15: qty 0.4

## 2016-11-15 MED ORDER — KETOROLAC TROMETHAMINE 30 MG/ML IJ SOLN
30.0000 mg | Freq: Once | INTRAMUSCULAR | Status: AC
Start: 2016-11-15 — End: 2016-11-15
  Administered 2016-11-15: 30 mg via INTRAVENOUS
  Filled 2016-11-15: qty 1

## 2016-11-15 NOTE — H&P (Signed)
Date: 11/15/2016               Patient Name:  Kathleen Stone MRN: [id number]  DOB: 04-16-1966 Age / Sex: 51 y.o., female   PCP: Minus Liberty, MD         Medical Service: Internal Medicine Teaching Service         Attending Physician: Dr. Axel Filler, MD    First Contact: Dr. Inda Castle Pager: [phone]  Second Contact: Dr. Marlowe Sax Pager: [phone]       After Hours (After 5p/  First Contact Pager: [phone]  weekends / holidays): Second Contact Pager: [phone]   Chief Complaint: "It feels like someone was pounding on my chest and my left arm is tingling."  History of Present Illness: Kathleen Stone is a 51 y.o. female with history of chronic low back pain, obesity, and tobacco abuse who presents with one day of atypical chest pain and left arm parasthesias.  Yesterday, she had sudden onset of chest discomfort and left arm tingling while sitting in her truck at work.  She described the pain as "someone beating on her chest", 8/10 in intensity, some tenderness to palpation.  No shortness of breath or nausea, not associated with exertion.  No weakness in her arm, limited ROM, or injury.  She has never had similar symptoms.  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  For the last 2 days she has had increased stress at work, because she has been working with a lazy partner and having to do the work of 2 people.  She thinks her symptoms could be due to anxiety.    She did no come the ED yesterday due to financial concerns about the co-pay, but when her symptoms continued today she decided to come in.  She received Toradol and her chest pain resolved.  Drinks 2-4L of [employer] and one cup of coffee daily.  Meds:  No outpatient prescriptions have been marked as taking for the 11/15/16 encounter [facility] Encounter).  Not taking any  medications    Allergies: Allergies as of 11/15/2016 - Review Complete 11/15/2016  Allergen Reaction Noted  . Iohexol Hives and Itching 08/13/2007   Past Medical History:  Diagnosis Date  . Chronic back pain    Negative MRI of hips bilaterally, neg CT scan of A and P; MRI of T and L spine-> very mild deg changes from T4-5 and T7-8, cervical spondylotic changes at C5-6, very mild disc bulge at L2-3 without spinal stenosis, nl ESR, ANA, RF, HIV, TSH, and B12  . DJD (degenerative joint disease) of cervical spine   . Galactorrhea of both breasts 2009   R. sided w/ benign papilloma excised in 4/09. L sided in 5/09  . Migraine   . Neuropathy (HCC)    Thought to be 2/2 nerve compression in the C-spine  . Obesity   . Personal history of adenomatous colonic polyp    09/2007 - diminutive adenoma [name])  . Tobacco abuse     Family History:  Mother had pacemaker for unknown reason Mother and father with DM, HTN  Social History:  Current 1 pack per day smoker, 30 pk year history Rare alcohol No drugs  Review of Systems: A complete ROS was negative except as per HPI.  Physical Exam: Blood pressure 134/65, pulse 68, temperature 98.9 F (37.2  C), temperature source Oral, resp. rate 17, SpO2 99 %.  Physical Exam  Constitutional: She is oriented to person, place, and time.  Obese woman lying in stretcher in no distress  HENT:  Head: Normocephalic and atraumatic.  Eyes: Conjunctivae are normal. No scleral icterus.  Neck: Normal range of motion. Neck supple. No thyromegaly present.  Cardiovascular: Normal rate, regular rhythm and normal heart sounds.   Pulmonary/Chest: Effort normal and breath sounds normal. No respiratory distress.  Abdominal: Soft. She exhibits no distension. There is no tenderness.  Musculoskeletal: She exhibits no edema.  Mild diffuse tenderness to palpation over costochondral borders, L upper chest, and L shoulder  Lymphadenopathy:    She has no cervical  adenopathy.  Neurological: She is alert and oriented to person, place, and time.  Strength 5/5 symmetric throughout UE Subjectively decreased sensation to light touch over L 1st and 2nd digits Negative Tinel and Phalen No pain with palpation of ulnar groove Reflexes 2+ symmetric biceps and brachioradialis  Psychiatric: She has a normal mood and affect. Her behavior is normal.    CBC Latest Ref Rng & Units 11/15/2016 01/20/2013 07/24/2012  WBC 4.0 - 10.5 K/uL 9.9 10.3 11.9(H)  Hemoglobin 12.0 - 15.0 g/dL 14.2 14.4 15.1(H)  Hematocrit 36.0 - 46.0 % 43.4 41.6 44.2  Platelets 150 - 400 K/uL 248 220 268   CMP Latest Ref Rng & Units 11/15/2016 11/01/2015 01/20/2013  Glucose 65 - 99 mg/dL 95 100(H) 101(H)  BUN 6 - 20 mg/dL '16 10 12  ' Creatinine 0.44 - 1.00 mg/dL 1.03(H) 0.89 0.75  Sodium 135 - 145 mmol/L 140 140 138  Potassium 3.5 - 5.1 mmol/L 4.4 4.6 4.0  Chloride 101 - 111 mmol/L 107 102 105  CO2 22 - 32 mmol/L '23 23 26  ' Calcium 8.9 - 10.3 mg/dL 9.3 9.5 9.5  Total Protein 6.0 - 8.5 g/dL - 6.3 -  Total Bilirubin 0.0 - 1.2 mg/dL - 0.4 -  Alkaline Phos 39 - 117 IU/L - 79 -  AST 0 - 40 IU/L - 17 -  ALT 0 - 32 IU/L - 13 -   Troponin (Point of Care Test)  Recent Labs  11/15/16 1322  TROPIPOC 0.01   Chest Radiographs 11/15/2016 Slightly underinflated.  Mildy increased pulmonary vascular markings.  No consolidation, edema, PTX.  EKG 11/15/2016 NSR, anterolateral early repolarization with marginal ST elevation ~71m with concave morphology in precordial leads.    Assessment & Plan by Problem: Active Problems:   Chest pain   Tingling of left upper extremity   51y.o. female with atypical chest pain and risk factors of obesity and tobacco abuse.  Her HEART score 3-4.  Her chest discomfort resolved with NSAIDs, though she reports some persistent minor LUE parasthesias.  Will rule out ACS, symptoms may be due to anxiety.  #Chest Pain -Repeat EKG in AM -Troponins -Cardiac monitoring -Voltaren  gel  #Parasthesias Minor subjective sensory disturbance without weakness or abnormal reflexes.  Hyperacute onset without injury makes neurologic etiology less likely.  Symptoms are not radicular or dermatomal, no suggestion of nerve impingement at elbow or wrist.  May be related to anxiety. -Monitor symptoms  #Anxiety Denies current anxiety, thought reports recent heightened work stress which may have precipitated her symptoms. -Follow-up as outpatient.  Fluids: none Diet: heart DVT Prophylaxis: lovenox Code Status: full  Dispo: Admit patient to Observation with expected length of stay less than 2 midnights.  Signed: MMinus Liberty MD 11/15/2016, 2:40 PM  Pager: [phone]

## 2016-11-15 NOTE — Progress Notes (Signed)
Transitions of Care Pharmacy Note  Plan:  Educated on CV risk reduction strategies - quit smoking, reduce sugary beverages, monitor BP, importance of BP meds Addressed concerns regarding nicotine patch Recommend assessing BP and deciding whether or not to continue at discharge - pt was noncompliant and reports BPs <130/90 prior to admission Recommend nicotine patch at 21mg /hr if clinically appropriate --------------------------------------------- Kathleen Stone is an 51 y.o. female who presents with a chief complaint of chest pain. In anticipation of discharge, pharmacy has reviewed this patient's prior to admission medication history, as well as current inpatient medications listed per the [facility].  Current medication indications, dosing, frequency, and notable side effects reviewed with patient. patient verbalized understanding of current inpatient medication regimen and is aware that the After Visit Summary when presented, will represent the most accurate medication list at discharge.   Kathleen Stone expressed concerns regarding nicotine patch as she smokes 1 pack per day, wakes in the middle of the night to smoke. Kathleen Stone also endorsed noncompliance to her BP medication, amlodipine. She states that her BP is usually 120s-130s SBP but could not remember diastolic readings. She reports that she drinks a significant amount of mountain dew daily.    Assessment: Understanding of regimen: good Understanding of indications: good Potential of compliance: poor Barriers to Obtaining Medications: No  Patient instructed to contact inpatient pharmacy team with further questions or concerns if needed.    Time spent preparing for discharge counseling: 15 mins Time spent counseling patient: 15 mins   Thank you for allowing pharmacy to be a part of this patient's care.  Kathleen Stone, Pharm.D. Kathleen Stone 3/1/20187:23 PM Pager [phone]

## 2016-11-15 NOTE — Progress Notes (Unsigned)
Patient sitting in [facility] lobby when she started complaining of chest pain pointing to mid chest, radiating to left arm. She stated it has been going on for 2 days. Rating it 9/10. Has never had this type of pain before.Denies nausea but states feeling weak. Transferred to ED via wheelchair with 2 [facility] triage RNs @ 0945.

## 2016-11-15 NOTE — ED Triage Notes (Signed)
Pt reports chest pain that began last night. She denies associated symptoms. Pain does radiate into her left arm. SHe was brought here from internal medicine.

## 2016-11-15 NOTE — ED Notes (Signed)
ED Provider at bedside. 

## 2016-11-15 NOTE — ED Notes (Signed)
Pt's chest pain unchanged after 2nd nitro, CP 5/10 now, Admitting MD paged and is to place order for pain medicine.

## 2016-11-15 NOTE — ED Provider Notes (Signed)
[facility] DEPT Provider Note   CSN: [id number] Arrival date & time: 11/15/16  0941     History   Chief Complaint Chief Complaint  Patient presents with  . Chest Pain    HPI Kathleen Stone is a 51 y.o. female.  HPI   Pt with hx obesity, smoking, untreated HTN p/w central chest pain that is described as heaviness and soreness with associated tingling in the left arm.  The pain began at 3:30pm yesterday while she was sitting in a truck. It has been constant.  Worse with movement and position.   Has had left leg swelling and soreness x several weeks, she thinks from working too many double shifts.  Denies lightheadedness/dizziness, SOB, cough, nausea.  Denies recent immobilization, exogenous estrogen, hx blood clots.  Mother had a pacemaker.  No known family hx MI and blood clots.    Past Medical History:  Diagnosis Date  . Chronic back pain    Negative MRI of hips bilaterally, neg CT scan of A and P; MRI of T and L spine-> very mild deg changes from T4-5 and T7-8, cervical spondylotic changes at C5-6, very mild disc bulge at L2-3 without spinal stenosis, nl ESR, ANA, RF, HIV, TSH, and B12  . DJD (degenerative joint disease) of cervical spine   . Galactorrhea of both breasts 2009   R. sided w/ benign papilloma excised in 4/09. L sided in 5/09  . Migraine   . Neuropathy (HCC)    Thought to be 2/2 nerve compression in the C-spine  . Obesity   . Personal history of adenomatous colonic polyp    09/2007 - diminutive adenoma [name])  . Tobacco abuse     Patient Active Problem List   Diagnosis Date Noted  . Chest pain 11/15/2016  . Essential hypertension 06/13/2016  . Right knee pain 02/16/2016  . Lumbar radiculopathy, chronic 01/04/2016  . Scleral discoloration 11/01/2015  . Personal history of adenomatous colonic polyp 08/17/2012  . Syncope 07/24/2012  . Preventative health care 07/24/2012  . UTI (lower urinary tract infection) 07/24/2012  . Depression 08/23/2011  .  Leg cramps 04/30/2011  . DEGENERATIVE DISC DISEASE, 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  . NEUROPATHY 07/28/2010  . UNSPECIFIED SLEEP DISTURBANCE 07/28/2010  . HEADACHE 08/04/2009  . LOW BACK PAIN SYNDROME 07/10/2007  . OBESITY NOS 01/15/2007  . LUMBAR STRAIN 01/03/2007  . TOBACCO ABUSE 10/07/2006  . MIGRAINE HEADACHE 10/04/2006    Past Surgical History:  Procedure Laterality Date  . APPENDECTOMY    . COLONOSCOPY W/ BIOPSIES AND POLYPECTOMY  09/2007   Dr. [name]. 2 small adenomatous polyps, internal hemorrhoids-Grade 1. Rec  routine colonoscopy at age 67.  . R. breast central duct excision  12/2007  . Right shoulder reconstruction      after hurting it by moving heavy objecs  . Septoplasty with bilateral inferior turbinate reduction  04/2008   Dr. Lucia Gaskins    OB History    No data available       Home Medications    Prior to Admission medications   Medication Sig Start Date End Date Taking? Authorizing Provider  amLODipine (NORVASC) 5 MG tablet Take 1 tablet (5 mg total) by mouth daily. 06/13/16 06/13/17  Jule Ser, DO  cyclobenzaprine (FLEXERIL) 5 MG tablet Take 1 tablet (5 mg total) by mouth 3 (three) times daily as needed for muscle spasms. Patient not taking: Reported on 11/15/2016 03/16/16 03/16/17  Bartholomew Crews, MD  pregabalin (LYRICA) 75 MG capsule Take 1 capsule (75  mg total) by mouth 2 (two) times daily. 06/13/16 06/13/17  Jule Ser, DO    Family History Family History  Problem Relation Age of Onset  . Heart failure Mother   . Diabetes Mother   . Breast cancer      aunt  . Liver cancer      aunt    Social History Social History  Substance Use Topics  . Smoking status: Current Every Day Smoker    Packs/day: 0.50    Types: Cigarettes  . Smokeless tobacco: Never Used     Comment: started at age 52.  Cutting back  . Alcohol use No     Allergies   Iohexol   Review of Systems Review of Systems  All other systems reviewed and are  negative.    Physical Exam Updated Vital Signs BP 128/67   Pulse 61   Temp 98.9 F (37.2 C) (Oral)   Resp 11   SpO2 99%   Physical Exam  Constitutional: She appears well-developed and well-nourished. No distress.  HENT:  Head: Normocephalic and atraumatic.  Neck: Neck supple.  Cardiovascular: Normal rate, regular rhythm and intact distal pulses.   Pulmonary/Chest: Effort normal and breath sounds normal. No respiratory distress. She has no wheezes. She has no rales. She exhibits tenderness.  Abdominal: Soft. She exhibits no distension. There is no tenderness. There is no rebound and no guarding.  Musculoskeletal:  No noted edema of the lower extremities.  Left calf mildly tender.    Neurological: She is alert.  Skin: She is not diaphoretic.  Psychiatric: She has a normal mood and affect. Her behavior is normal.  Nursing note and vitals reviewed.    ED Treatments / Results  Labs (all labs ordered are listed, but only abnormal results are displayed) Labs Reviewed  BASIC METABOLIC PANEL - Abnormal; Notable for the following:       Result Value   Creatinine, Ser 1.03 (*)    All other components within normal limits  CBC  D-DIMER, QUANTITATIVE (NOT AT [facility])  I-STAT TROPOININ, ED  Randolm Idol, ED    EKG  EKG Interpretation  Date/Time:  Thursday November 15 2016 09:48:49 EST Ventricular Rate:  67 PR Interval:  158 QRS Duration: 78 QT Interval:  424 QTC Calculation: 448 R Axis:   3 Text Interpretation:  Normal sinus rhythm Moderate voltage criteria for LVH, may be normal variant T wave abnormality, consider lateral ischemia Abnormal ECG Benign early repolarization Septal leads No significant change since last tracing Confirmed by KNOTT MD, DANIEL ([id number]) on 11/15/2016 10:01:23 AM       Radiology Dg Chest 2 View  Result Date: 11/15/2016 CLINICAL DATA:  51 year old female with chest pain for 2 days. Initial encounter. EXAM: CHEST  2 VIEW COMPARISON:  01/20/2013 and  earlier FINDINGS: Mildly lower lung volumes. Mediastinal contours remain normal. Visualized tracheal air column is within normal limits. No pneumothorax, pulmonary edema, pleural effusion or confluent pulmonary opacity. No acute osseous abnormality identified. Negative visible bowel gas pattern. IMPRESSION: Lower lung volumes.  No acute cardiopulmonary abnormality. Electronically Signed   By: Genevie Ann M.D.   On: 11/15/2016 11:06    Procedures Procedures (including critical care time)  Medications Ordered in ED Medications  nitroGLYCERIN (NITROSTAT) SL tablet 0.4 mg (not administered)  aspirin chewable tablet 324 mg (324 mg Oral Given 11/15/16 1301)  ketorolac (TORADOL) 30 MG/ML injection 30 mg (30 mg Intravenous Given 11/15/16 1302)     Initial Impression / Assessment and Plan /  ED Course  I have reviewed the triage vital signs and the nursing notes.  Pertinent labs & imaging results that were available during my care of the patient were reviewed by me and considered in my medical decision making (see chart for details).   Heart score is 4 -5.  Pt with obesity, smoking, untreated hypertension p/w constant central chest pain and left arm tingling since yesterday.  EKG is abnormal but unchanged from last visit.  Troponin is negative.  Pt admitted to [facility] Medicine Teaching Service (her PCP) for overnight observation for ACS r/o.    Final Clinical Impressions(s) / ED Diagnoses   Final diagnoses:  Chest pain, unspecified type    New Prescriptions New Prescriptions   No medications on file     Clayton Bibles, PA-C 11/15/16 [place], MD 11/15/16 [phone]

## 2016-11-16 ENCOUNTER — Encounter (HOSPITAL_COMMUNITY): Payer: Self-pay | Admitting: Cardiology

## 2016-11-16 ENCOUNTER — Observation Stay (HOSPITAL_BASED_OUTPATIENT_CLINIC_OR_DEPARTMENT_OTHER): Payer: 59

## 2016-11-16 ENCOUNTER — Other Ambulatory Visit: Payer: Self-pay | Admitting: Physician Assistant

## 2016-11-16 DIAGNOSIS — R079 Chest pain, unspecified: Secondary | ICD-10-CM | POA: Diagnosis not present

## 2016-11-16 DIAGNOSIS — F17210 Nicotine dependence, cigarettes, uncomplicated: Secondary | ICD-10-CM | POA: Diagnosis not present

## 2016-11-16 DIAGNOSIS — R0789 Other chest pain: Secondary | ICD-10-CM | POA: Diagnosis not present

## 2016-11-16 DIAGNOSIS — R072 Precordial pain: Secondary | ICD-10-CM | POA: Diagnosis not present

## 2016-11-16 DIAGNOSIS — I10 Essential (primary) hypertension: Secondary | ICD-10-CM | POA: Diagnosis not present

## 2016-11-16 LAB — HIV ANTIBODY (ROUTINE TESTING W REFLEX): HIV Screen 4th Generation wRfx: NONREACTIVE

## 2016-11-16 LAB — LIPID PANEL
CHOL/HDL RATIO: 5 ratio
Cholesterol: 175 mg/dL (ref 0–200)
HDL: 35 mg/dL — AB (ref >40)
LDL CALC: 107 mg/dL — AB (ref 0–99)
TRIGLYCERIDES: 166 mg/dL — AB (ref <150)
VLDL: 33 mg/dL (ref 0–40)

## 2016-11-16 LAB — ECHOCARDIOGRAM COMPLETE
Height: 66 in
Weight: 3929.6 oz

## 2016-11-16 LAB — TSH: TSH: 1.724 u[IU]/mL (ref 0.350–4.500)

## 2016-11-16 MED ORDER — ATORVASTATIN CALCIUM 40 MG PO TABS: 40.0000 mg | ORAL_TABLET | Freq: Every day | ORAL | 3 refills | Status: DC

## 2016-11-16 MED ORDER — VARENICLINE TARTRATE 1 MG PO TABS: 1.0000 mg | ORAL_TABLET | Freq: Two times a day (BID) | ORAL | 2 refills | Status: AC

## 2016-11-16 MED ORDER — VARENICLINE TARTRATE 0.5 MG PO TABS: 0.5000 mg | ORAL_TABLET | Freq: Two times a day (BID) | ORAL | 0 refills | Status: DC

## 2016-11-16 MED ORDER — ATORVASTATIN CALCIUM 40 MG PO TABS: 40.0000 mg | ORAL_TABLET | Freq: Every day | ORAL | Status: DC

## 2016-11-16 MED ORDER — NAPROXEN 500 MG PO TABS: 500.0000 mg | ORAL_TABLET | Freq: Two times a day (BID) | ORAL | 0 refills | Status: AC

## 2016-11-16 MED ORDER — VARENICLINE TARTRATE 1 MG PO TABS
1.0000 mg | ORAL_TABLET | Freq: Two times a day (BID) | ORAL | 2 refills | Status: DC
Start: 2016-11-16 — End: 2016-11-16

## 2016-11-16 NOTE — Progress Notes (Signed)
   Subjective: No acute events overnight.  Mild chest tenderness to palpation and left arm tingling persists.  No dyspnea, nausea, or other symptoms.  Wants to quit smoking.  Feels ready to go home.  Objective:  Vital signs in last 24 hours: Vitals:   11/15/16 1715 11/15/16 1827 11/15/16 2249 11/16/16 0428  BP: 136/65 (!) 115/98 (!) 129/53 (!) 133/50  Pulse: (!) 58 70 76 70  Resp: 15     Temp:  98.1 F (36.7 C) 98.3 F (36.8 C) 97.8 F (36.6 C)  TempSrc:  Oral Oral Oral  SpO2: 98% 94% 97% 96%  Weight:  245 lb 14.4 oz (111.5 kg)  245 lb 9.6 oz (111.4 kg)  Height:  5\' 6"  (1.676 m)     Physical Exam  Constitutional: She is oriented to person, place, and time. She appears well-developed and well-nourished. No distress.  Cardiovascular: Normal rate and regular rhythm.   Pulmonary/Chest: Effort normal and breath sounds normal.  Musculoskeletal:  Mild chest well tenderness to palpation  Neurological: She is alert and oriented to person, place, and time.  Psychiatric: She has a normal mood and affect. Her behavior is normal.   Cardiac Panel (last 3 results)  Recent Labs  11/15/16 1843  TROPONINI <0.03   EKG 11/16/2016 Unchanged from admission.  NSR, anterolateral early repolarization with marginal ST elevation ~72mm with concave morphology in precordial leads.   Assessment/Plan:  Active Problems:   Chest pain   Tingling of left upper extremity   51 y.o. female with atypical chest pain who has ruled out for ACS.  Her chest tenderness has nearly resolved and she is ready for discharge.  Investigating risk factors for medical optimization.  #Chest Pain -Lipid panel -A1c -TSH -NSAIDs for MSK pain  #Parasthesias Improving, no weakness, not suggestive of radiculopathy or impingement at elbow or wrist. -Monitor symptoms  Fluids: none Diet: heart DVT Prophylaxis: lovenox Code Status: full  Dispo: Anticipated discharge today.  Minus Liberty, MD 11/16/2016, 8:26  AM Pager: [phone]

## 2016-11-16 NOTE — Progress Notes (Signed)
Patient was counseled on her new medications, atorvastatin and chantix.  Patient was instructed to take atorvastatin nightly and to let her Primary Care Provider know if she starts to notice unusual muscle aches, as a common side effect of atorvastatin is myalgias.  Regarding chantix, patient was counseled on the administration, dosing, and common side effects.  The importance of smoking cessation was reiterated to the patient.  Jacorey Donaway and Quincy Sheehan PharmD Candidates, 2018 [place]

## 2016-11-16 NOTE — Discharge Instructions (Addendum)
You were admitted to the hospital for chest pain.  We determined that you did not have a heart attack, but you do have some risk factors for developing heart disease in the future.  To reduce your risk of heart attack, I have prescribed you atorvastatin to lower your cholesterol.  It is also important to lose weight and quit smoking.  To help cut down on the urge to smoke, I have prescribed you Chantix.  Please talk about quitting when you follow up in the clinic.  If you have chest pain or shortness of breath again, please come back to the ER as these could still be signs of something dangerous.    Chest Wall Pain Chest wall pain is pain in or around the bones and muscles of your chest. Sometimes, an injury causes this pain. Sometimes, the cause may not be known. This pain may take several weeks or longer to get better. Follow these instructions at home: Pay attention to any changes in your symptoms. Take these actions to help with your pain:  Rest as told by your health care provider.  Avoid activities that cause pain. These include any activities that use your chest muscles or your abdominal and side muscles to lift heavy items.  If directed, apply ice to the painful area:  Put ice in a plastic bag.  Place a towel between your skin and the bag.  Leave the ice on for 20 minutes, 2-3 times per day.  Take over-the-counter and prescription medicines only as told by your health care provider.  Do not use tobacco products, including cigarettes, chewing tobacco, and e-cigarettes. If you need help quitting, ask your health care provider.  Keep all follow-up visits as told by your health care provider. This is important. Contact a health care provider if:  You have a fever.  Your chest pain becomes worse.  You have new symptoms. Get help right away if:  You have nausea or vomiting.  You feel sweaty or light-headed.  You have a cough with phlegm (sputum) or you cough up  blood.  You develop shortness of breath. This information is not intended to replace advice given to you by your health care provider. Make sure you discuss any questions you have with your health care provider. Document Released: 09/03/2005 Document Revised: 01/12/2016 Document Reviewed: 11/29/2014 Elsevier Interactive Patient Education  2017 [employer].

## 2016-11-16 NOTE — Discharge Summary (Signed)
Name: Kathleen Stone MRN: [id number] DOB: 12-07-1965 51 y.o. PCP: Minus Liberty, MD  Date of Admission: 11/15/2016 10:23 AM Date of Discharge: 11/16/2016 Attending Physician: Lalla Brothers, MD  Discharge Diagnosis:  Principal Problem:   Musculoskeletal chest pain Active Problems:   TOBACCO ABUSE   Tingling of left upper extremity   Discharge Medications: Allergies as of 11/16/2016      Reactions   Iohexol Hives, Itching   Benadryl 50mg  PO one hour before iodinated contrast studies.  jkl      Medication List    STOP taking these medications   amLODipine 5 MG tablet Commonly known as:  NORVASC   cyclobenzaprine 5 MG tablet Commonly known as:  FLEXERIL   pregabalin 75 MG capsule Commonly known as:  LYRICA     TAKE these medications   atorvastatin 40 MG tablet Commonly known as:  LIPITOR Take 1 tablet (40 mg total) by mouth daily at 6 PM.   naproxen 500 MG tablet Commonly known as:  NAPROSYN Take 1 tablet (500 mg total) by mouth 2 (two) times daily with a meal.   varenicline 1 MG tablet Commonly known as:  CHANTIX CONTINUING MONTH PAK Take 1 tablet (1 mg total) by mouth 2 (two) times daily. Start taking in 1 week after finishing prescription for 0.5 mg pills.   varenicline 0.5 MG tablet Commonly known as:  CHANTIX Take 1 tablet (0.5 mg total) by mouth 2 (two) times daily. Take 1 pill daily on days 1, 2, and 3.  Then take 1 pill twice daily on days 4-7.       Disposition and follow-up:   [name] was discharged from [facility] in Good condition.  At the hospital follow up visit please address:  1.  Chest Pain.  Ask about chest pain, dyspnea, and statin adherence and side effects.  2.  Smoking.  Discuss smoking cessation.  Ask about Chantix side effects.  3.  Left arm parasthesias.  Ask about weakness, numbness, tingling.  4.  Labs / imaging needed at time of follow-up: none  5.  Pending labs/ test needing follow-up:  A1c  Follow-up Appointments: Follow-up Information    [facility]. Schedule an appointment as soon as possible for a visit in 2 week(s).   Why:  They will call you to schedule appointment.  If you don't hear from them by Monday, please call for an appointment. Contact information: [address] [place] [facility] Course by problem list: Principal Problem:   Musculoskeletal chest pain Active Problems:   TOBACCO ABUSE   Tingling of left upper extremity   1. Chest Pain 51 year old woman with cardiac risk factors of obesity, hyperlipidemia, and tobacco abuse who presented with atypical chest pain.  She has had increased psychosocial stress at work for the 2 days prior to admission, and while sitting in her truck at work had sudden onset of non-extertional chest pain which felt like "someone beating on her chest" and tingling in her left arm.  ACS was ruled out with serial negative troponins.  Her EKG is abnormal with early repolarization pattern in precordial leads, but no signs of acute ischemia.  Her chest wall had mild, diffuse tenderness to palpation, and chest pain resolved with NSAIDs.  Cardiology evaluated her and ordered echocardiogram, which showed normal EF and diastolic dysfunction.  ASCVD calculated at 8.2% and she was  started on moderate intensity statin therapy at discharge.  2. Tobacco Abuse Current pack a day smoker with 30 pack year history and wants to quit.  Prescribed Chantix at discharge.  3. Left Arm Parasthesias Reported tingling in her left arm that started at the same time as her chest pain, without weakness, stiffness, or functional limitation.  On exam, she endorses mildly reduced sensation to light touch in her left 1st and 2nd fingers, and strength and reflexes were normal and symmetric.  Symptoms not consistent with cervical radiculopathy and no signs of impingement at elbow or  wrist.  Discharge Vitals:   BP (!) 133/50 (BP Location: Right Arm)   Pulse 70   Temp 97.8 F (36.6 C) (Oral)   Resp 15   Ht 5\' 6"  (1.676 m)   Wt 245 lb 9.6 oz (111.4 kg)   SpO2 96%   BMI 39.64 kg/m   Pertinent Labs, Studies, and Procedures:   EKG 3/1 and 11/16/2016 NSR, anterolateral early repolarization withmarginal ST elevation ~43mm with concave morphologyin precordial leads.   Echocardiogram 11/16/2016 EG 65-70%, G2DD, no regional wall motion abnormalities  Lipid Panel     Component Value Date/Time   CHOL 175 11/16/2016 0816   TRIG 166 (H) 11/16/2016 0816   HDL 35 (L) 11/16/2016 0816   CHOLHDL 5.0 11/16/2016 0816   VLDL 33 11/16/2016 0816   LDLCALC 107 (H) 11/16/2016 0816    Discharge Instructions: Discharge Instructions    Diet - low sodium heart healthy    Complete by:  As directed    Increase activity slowly    Complete by:  As directed     You were admitted to the hospital for chest pain.  We determined that you did not have a heart attack, but you do have some risk factors for developing heart disease in the future.  To reduce your risk of heart attack, I have prescribed you atorvastatin to lower your cholesterol.  It is also important to lose weight and quit smoking.  To help cut down on the urge to smoke, I have prescribed you Chantix.  Please talk about quitting when you follow up in the clinic.  If you have chest pain or shortness of breath again, please come back to the ER as these could still be signs of something dangerous.  Signed: Minus Liberty, MD 11/16/2016, 2:53 PM   Pager: [phone]

## 2016-11-16 NOTE — Progress Notes (Signed)
Patient was discharged home accompanied by her partner with no apparent distress noted. Medications and discharge instructions explained to the patient. She verbalized understanding. Copies given to her. IV saline lock and tele pack removed. CCMD aware

## 2016-11-16 NOTE — Consult Note (Signed)
Cardiology Consult    Patient ID: JANNETTA MASSEY MRN: [id number], DOB/AGE: 04-02-1966   Admit date: 11/15/2016 Date of Consult: 11/16/2016  Primary Physician: Minus Liberty, MD Primary Cardiologist: new - Dr. Percival Spanish  Requesting Provider: Dr. Evette Doffing  Reason for Consult: Chest pain  Patient Profile    Ms. Culbreth is a 51 yo female with a PMH significant for syncope, depression, HTN, smoker, neuropathy, and obesity. She presented to the ED with chest pain and left arm tingling. Cardiology was consulted for further ACS workup.   Past Medical History   Past Medical History:  Diagnosis Date  . Chronic lower back pain    Negative MRI of hips bilaterally, neg CT scan of A and P; MRI of T and L spine-> very mild deg changes from T4-5 and T7-8, cervical spondylotic changes at C5-6, very mild disc bulge at L2-3 without spinal stenosis, nl ESR, ANA, RF, HIV, TSH, and B12  . DJD (degenerative joint disease) of cervical spine   . Family history of adverse reaction to anesthesia    "sisters PONV"  . Galactorrhea of both breasts 2009   R. sided w/ benign papilloma excised in 4/09. L sided in 5/09  . Hypertension   . Migraine    "monthly" (11/15/2016)  . Neuropathy (HCC)    Thought to be 2/2 nerve compression in the C-spine  . Obesity   . Personal history of adenomatous colonic polyp    09/2007 - diminutive adenoma [name])  . PONV (postoperative nausea and vomiting)   . Tobacco abuse     Past Surgical History:  Procedure Laterality Date  . APPENDECTOMY    . BREAST SURGERY Right 12/2007   central duct excision  . COLONOSCOPY W/ BIOPSIES AND POLYPECTOMY  09/2007   Dr. [name]. 2 small adenomatous polyps, internal hemorrhoids-Grade 1. Rec  routine colonoscopy at age 43.  Marland Kitchen COLONOSCOPY W/ BIOPSIES AND POLYPECTOMY    . NASAL SEPTOPLASTY W/ TURBINOPLASTY Bilateral 04/2008   Septoplasty with bilateral inferior turbinate reduction; Dr. Lucia Gaskins  . SHOULDER OPEN ROTATOR CUFF REPAIR Right       after hurting it by moving heavy objecs     Allergies  Allergies  Allergen Reactions  . Iohexol Hives and Itching    Benadryl 49m PO one hour before iodinated contrast studies.  jkl    History of Present Illness    Ms FSchromstates she had a sudden onset of left-sided chest discomfort and left arm tingling while sitting in her work vehicle on 11/14/16 around 4pm. She ignored the pain and tingling that evening and woke up with the pain the next morning. On 11/15/16, she reported to work, but states the left arm tingling was worse and she reported to the ED.    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 She reports no chest pain with exertion before 11/14/16.  Her first bout of chest pain on 11/14/16 was at rest. Changes in position or moving her upper extremities do not change the pain. Exertion does not make the pain worse. She states the pain feels like someone is pounding on her chest. She denies recent illness and sick contacts. She denies associated SOB, palpitations, lightheadedness, or dizziness. SL nitro x 2 in the ED did not relieve her pain. However, toradol decreased her pain from an 8/10 to a 5/10. She continues to have left arm tingling from her shoulder to her hand.  Troponin x 1 negative, EKG with new ST elevations in V1-3. On exam, she stated her chest was sore when I palpated. She states she occasionally has mild LE swelling, likely secondary to being on her feet all day.   Inpatient Medications    . diclofenac sodium  2 g Topical QID  . enoxaparin (LOVENOX) injection  40 mg Subcutaneous Q24H  . Influenza vac split quadrivalent PF  0.5 mL Intramuscular Tomorrow-1000  . nicotine  14 mg Transdermal Daily  . pneumococcal 23 valent vaccine  0.5 mL Intramuscular Tomorrow-1000     Outpatient Medications    Prior to Admission medications   Medication Sig Start Date End Date Taking? Authorizing  Provider  amLODipine (NORVASC) 5 MG tablet Take 1 tablet (5 mg total) by mouth daily. 06/13/16 06/13/17  Jule Ser, DO  cyclobenzaprine (FLEXERIL) 5 MG tablet Take 1 tablet (5 mg total) by mouth 3 (three) times daily as needed for muscle spasms. Patient not taking: Reported on 11/15/2016 03/16/16 03/16/17  Bartholomew Crews, MD  pregabalin (LYRICA) 75 MG capsule Take 1 capsule (75 mg total) by mouth 2 (two) times daily. 06/13/16 06/13/17  Jule Ser, DO     Family History     Family History  Problem Relation Age of Onset  . Heart failure Mother   . Diabetes Mother   . Breast cancer      aunt  . Liver cancer      aunt    Social History    Social History   Social History  . Marital status: Single    Spouse name: N/A  . Number of children: N/A  . Years of education: N/A   Occupational History  . Not on file.   Social History Main Topics  . Smoking status: Current Every Day Smoker    Packs/day: 1.00    Years: 31.00    Types: Cigarettes  . Smokeless tobacco: Never Used  . Alcohol use No  . Drug use: No  . Sexual activity: Yes   Other Topics Concern  . Not on file   Social History Narrative   Occupation: [employer] part time   Domestic Partner: female has emphysema   Smoked 2 packs/day for 20+ years, down to 1/2ppd now, willing to try to quit    Alcohol use-no   Drug use-no   Regular exercise-no     Review of Systems    General:  No chills, fever, night sweats or weight changes.  Cardiovascular:  No dyspnea on exertion, edema, orthopnea, palpitations, paroxysmal nocturnal dyspnea. Positive for chest pain; positive for left arm tingling Dermatological: No rash, lesions/masses Respiratory: No cough, dyspnea Urologic: No hematuria, dysuria Abdominal:   No nausea, vomiting, diarrhea, bright red blood per rectum, melena, or hematemesis Neurologic:  No visual changes, wkns, changes in mental status. All other systems reviewed and are otherwise negative except as  noted above.  Physical Exam    Blood pressure (!) 133/50, pulse 70, temperature 97.8 F (36.6 C), temperature source Oral, resp. rate 15, height '5\' 6"'  (1.676 m), weight 245 lb 9.6 oz (111.4 kg), SpO2 96 %.  General: Pleasant, NAD Psych: Normal affect. Neuro: Alert and oriented X 3. Moves all extremities spontaneously. HEENT: Normal  Neck: Supple without bruits or JVD. Lungs:  Resp regular and unlabored, CTA. Heart: RRR no s3, s4, or murmurs, mildly tender with palpation to her left upper chest Abdomen: Soft, non-tender, non-distended, BS + x 4.  Extremities: No clubbing, cyanosis or edema. DP/PT/Radials 2+  and equal bilaterally; intact movement and sensation in upper extremities B.  Labs    Troponin [facility] Test)  Recent Labs  11/15/16 1322  TROPIPOC 0.01    Recent Labs  11/15/16 1843  TROPONINI <0.03   Lab Results  Component Value Date   WBC 9.9 11/15/2016   HGB 14.2 11/15/2016   HCT 43.4 11/15/2016   MCV 88.2 11/15/2016   PLT 248 11/15/2016     Recent Labs Lab 11/15/16 0952  NA 140  K 4.4  CL 107  CO2 23  BUN 16  CREATININE 1.03*  CALCIUM 9.3  GLUCOSE 95   Lab Results  Component Value Date   CHOL 168 06/21/2011   HDL 48 06/21/2011   LDLCALC 97 06/21/2011   TRIG 115 06/21/2011   Lab Results  Component Value Date   DDIMER <0.27 11/15/2016     Radiology Studies    Dg Chest 2 View  Result Date: 11/15/2016 CLINICAL DATA:  51 year old female with chest pain for 2 days. Initial encounter. EXAM: CHEST  2 VIEW COMPARISON:  01/20/2013 and earlier FINDINGS: Mildly lower lung volumes. Mediastinal contours remain normal. Visualized tracheal air column is within normal limits. No pneumothorax, pulmonary edema, pleural effusion or confluent pulmonary opacity. No acute osseous abnormality identified. Negative visible bowel gas pattern. IMPRESSION: Lower lung volumes.  No acute cardiopulmonary abnormality. Electronically Signed   By: Genevie Ann M.D.   On:  11/15/2016 11:06    ECG & Cardiac Imaging    EKG 11/16/16: sinus rhythm, T wave inversion in Lead I and AVL which are documented on previous EKGs, and new minimal ST elevation in V1/2/3     Assessment & Plan    1. Chest pain - troponin x 1 negative - EKG with old T wave inversion in Lead I and AVL, and new mild ST elevation in V1/2/3 - lipid panel pending - chest pain was not relieved with SL nitro; but was relieved with toradol which decreased pain from an 8/10 to a 5/10 - she reports chest soreness with palpation - given her presentation of atypical chest pain with a negative troponin and EKG with new minimal ST elevation in V1-3, would consider pericarditis vs costochondritis vs muscle strain - would consider an echocardiogram to rule out pericarditis and pericardial effusion - continue trending troponin - will discuss with MD   2. Left arm tingling - she has a history of left rotator cuff injury - intact motor and sensation - positive radial pulses - further evaluation of possible neuropathy by primary team   3. Smoker - advised against smoking - nicotine patch administered  Signed, Ledora Bottcher, PA-C 11/16/2016, 9:32 AM   History and all data above reviewed.  Patient examined.  I agree with the findings as above. She has chest pain with atypical greater than typical features.   The patient exam reveals COR:RRR, no rub  ,  Lungs: .clear  ,  Abd: Positive bowel sounds, no rebound no guarding, Ext No edema   .  All available labs, radiology testing, previous records reviewed. Agree with documented assessment and plan. Chest pain:  Atypical for obstructive CAD.  Small possibility of pericarditis given the ST changes.  T wave inversion in the lateral leads is old.  I will suggest an echo.  If this is OK she could go home but I would complete ischemia testing with a Lexiscan Myoview as an outpatient.    Jeneen Rinks Torence Palmeri  10:26 AM  11/16/2016

## 2016-11-17 LAB — HEMOGLOBIN A1C
Hgb A1c MFr Bld: 5.3 % (ref 4.8–5.6)
Mean Plasma Glucose: 105 mg/dL

## 2016-11-19 ENCOUNTER — Telehealth: Payer: Self-pay | Admitting: *Deleted

## 2016-11-19 ENCOUNTER — Encounter: Payer: Self-pay | Admitting: Internal Medicine

## 2016-11-19 NOTE — Telephone Encounter (Signed)
Call from patient-she was unable to return to work today-needs letter from MD stating she can return.  Will send to discharging MD for review, please advise.Despina Hidden Cassady3/5/20189:19 AM

## 2016-11-21 ENCOUNTER — Telehealth: Payer: Self-pay | Admitting: *Deleted

## 2016-11-21 NOTE — Telephone Encounter (Signed)
-----   Message from [place], [place] sent at 11/16/2016  3:24 PM EST ----- Please schedule this patient for a follow-up appointment.  Primary Cardiologist: New to Dr. Percival Spanish @ [facility] Date of Discharge: 11/16/2016. Patient is already discharged Appointment Needed Within: 2-3 weeks  Appointment Type: Post hospital   This patient also need a Lexiscan Myoview prior to appointment. Weight 245lb.   Thank you! Robbie Lis, PA-C

## 2016-11-21 NOTE — Telephone Encounter (Signed)
Attempted to call the patient.  No answer/unable to leave a message to call as the mailbox is full.

## 2016-11-23 ENCOUNTER — Telehealth: Payer: Self-pay | Admitting: Internal Medicine

## 2016-11-23 NOTE — Telephone Encounter (Signed)
APT. REMINDER CALL, LMTCB °

## 2016-11-23 NOTE — Telephone Encounter (Signed)
Patient was called. There was no answer. Unable to leave message, it states that the mailbox is full.

## 2016-11-26 ENCOUNTER — Ambulatory Visit (INDEPENDENT_AMBULATORY_CARE_PROVIDER_SITE_OTHER): Payer: 59 | Admitting: Internal Medicine

## 2016-11-26 VITALS — BP 161/71 | HR 69 | Temp 97.5°F | Ht 66.0 in | Wt 251.0 lb

## 2016-11-26 DIAGNOSIS — F17200 Nicotine dependence, unspecified, uncomplicated: Secondary | ICD-10-CM

## 2016-11-26 DIAGNOSIS — R0789 Other chest pain: Secondary | ICD-10-CM

## 2016-11-26 DIAGNOSIS — F17210 Nicotine dependence, cigarettes, uncomplicated: Secondary | ICD-10-CM | POA: Diagnosis not present

## 2016-11-26 DIAGNOSIS — I10 Essential (primary) hypertension: Secondary | ICD-10-CM | POA: Diagnosis not present

## 2016-11-26 DIAGNOSIS — R0600 Dyspnea, unspecified: Secondary | ICD-10-CM

## 2016-11-26 DIAGNOSIS — Z5189 Encounter for other specified aftercare: Secondary | ICD-10-CM

## 2016-11-26 DIAGNOSIS — M5416 Radiculopathy, lumbar region: Secondary | ICD-10-CM

## 2016-11-26 DIAGNOSIS — Z9114 Patient's other noncompliance with medication regimen: Secondary | ICD-10-CM | POA: Diagnosis not present

## 2016-11-26 DIAGNOSIS — G479 Sleep disorder, unspecified: Secondary | ICD-10-CM

## 2016-11-26 DIAGNOSIS — R202 Paresthesia of skin: Secondary | ICD-10-CM | POA: Diagnosis not present

## 2016-11-26 DIAGNOSIS — R0602 Shortness of breath: Secondary | ICD-10-CM | POA: Insufficient documentation

## 2016-11-26 MED ORDER — GABAPENTIN 300 MG PO CAPS: 300.0000 mg | ORAL_CAPSULE | Freq: Two times a day (BID) | ORAL | 2 refills | Status: DC

## 2016-11-26 NOTE — Assessment & Plan Note (Signed)
Patient reports difficulty sleeping at night. She has been taking advil PM prn. She endorses drinking multiple caffeinated beverages throughout the day (mostly mountain dew) and smoking cigarettes up until bed time. Today we discussed good sleep hygiene habits including limiting caffeine in the afternoon, not smoking before bed, and setting a regular sleep schedule. We also discussed not watching TV in the bedroom and limiting electronics before bed.  -- Sleep hygiene  -- OTC melatonin prn

## 2016-11-26 NOTE — Assessment & Plan Note (Addendum)
Patient was discharged with a prescription for Chantix but has been unable to fill due to pending insurance approval.  -- Will follow up with pharmacy -- Encouraged cessation

## 2016-11-26 NOTE — Assessment & Plan Note (Signed)
Patient reports SOB that is worse with activity. Given her chronic smoking history, will refer for PTFs to rule out COPD. She is currently smoking 1/2 PPD. Encouraged cessation the setting of her symptoms.  -- PFTs -- F/u 4-6 weeks after PFTs -- Smoking cessation -- Chantix

## 2016-11-26 NOTE — Assessment & Plan Note (Addendum)
Patient is here for hospital follow up. She was admitted with atypical chest pain and ACS was ruled out with negative biomarkers and non ischemic EKGs. Etiology was felt to be MSK. She was started on atorvastatin 40 mg for primary prevention. Chest pain has now resolved. She is tolerating the statin well without side effects.  -- Continue atorvastatin 40 mg daily

## 2016-11-26 NOTE — Patient Instructions (Addendum)
Kathleen Stone,  Please start taking your previously prescribed amlodipine 5 mg once a day for your blood pressure. For your shortness of breath, I have referred you for a lung test called pulmonary function testing. You will be called for an appointment. Depending on these results, we may need to start an inhaler for your breathing. It is very important given your symptoms that you stop smoking. I will follow up later this week with our pharmacist regarding your Chantix prescription. I have ordered an MRI of your neck to look for a cause of your left arm numbness and tingling. I have also refilled your gabapentin 300 mg twice a day for your back pain. Please return in 4-6 weeks after your breathing test for follow up. If you have any questions or concerns, call our clinic at [phone] or after hours call [phone] and ask for the internal medicine resident on call. Thank you!

## 2016-11-26 NOTE — Telephone Encounter (Signed)
Has follow up scheduled, myoview is not scheduled.  This is Dr. Percival Spanish patient.  Will forward to NL triage.

## 2016-11-26 NOTE — Assessment & Plan Note (Signed)
Patient requesting refill of her gabapentin. Recently switched to lyrica but she has been unable to afford to refill the medicine and would like to switch back to gabapentin.  -- Discontinue lyrica -- Restart gabapentin 300 mg BID

## 2016-11-26 NOTE — Assessment & Plan Note (Signed)
Uncontrolled today, 170/63 and 161/71 on recheck. Patient was previously prescribed amlodipine 5 mg daily but reports she has not been taking it. Educated patient on the importance of medication compliance and the long term implications of uncontrolled BP. Patient expressed understanding.  -- Continue amlodipine 5 mg daily -- Encourage compliance  -- F/u 4-6 weeks

## 2016-11-26 NOTE — Assessment & Plan Note (Signed)
Symptoms started about two weeks ago and were associated with chest pain for which she was hospitalized. Chest pain has resolved and ACS work up was negative, but she continues to have left are paresthesias that radiate down from her neck. Symptoms are constant and not improving. Will obtain imaging to rule out impingement.  -- Cervical spine MRI

## 2016-11-26 NOTE — Progress Notes (Signed)
   CC: Hospital follow up   HPI:  [name] is a 51 y.o. F with pmhx outlined below here for hospital follow up. Patient was discharged on 11/16/2016 after hospitalization for atypical chest pain work up. ACS was ruled out with negative cardiac biomarkers and non ischemic EKGs. She was evaluated by cardiology and felt to be intermediate risk for CAD. Atorvastatin 40 mg was started for primary prevention with calculated ASCVD of 8.2%. Echocardiogram performed revealed normal systolic function, normal wall motion, and grade 2 diastolic dysfunction. Smoking cessation was encouraged and Chantix was started on discharge.   Today, patient denies any further chest pain, but continues to have shortness of breath with activity and left arm paresthesias that radiate down her arm from her neck. The pain and numbness are constant and involve all of her fingers. She first noticed this around the time her chest pain started but has not improved. She continues to smoke 1/2 PPD. She has been unable to fill the Chantix prescription because she is waiting on approval from her insurance company. Patient was previously prescribed amlodipine 5 mg daily but reports non compliance. She is requesting a refill of her gabapentin 300 mg BID. She was recently switched to Lyrica due to decreased effectiveness of the gabapentin but patient says she cannot afford the Lyrica and would like to switch back. Lastly, she is complaining of difficulty sleeping. She is accompanied by a friend who reports patient is drinking excessive amounts of mountain dew and smoking cigarettes up until bed time. She denies alcohol use. She has been taking advil PM prn with minimal effectiveness.   Past Medical History:  Diagnosis Date  . Chronic lower back pain    Negative MRI of hips bilaterally, neg CT scan of A and P; MRI of T and L spine-> very mild deg changes from T4-5 and T7-8, cervical spondylotic changes at C5-6, very mild disc bulge at  L2-3 without spinal stenosis, nl ESR, ANA, RF, HIV, TSH, and B12  . DJD (degenerative joint disease) of cervical spine   . Family history of adverse reaction to anesthesia    "sisters PONV"  . Galactorrhea of both breasts 2009   R. sided w/ benign papilloma excised in 4/09. L sided in 5/09  . Hypertension   . Migraine    "monthly" (11/15/2016)  . Neuropathy (HCC)    Thought to be 2/2 nerve compression in the C-spine  . Obesity   . Personal history of adenomatous colonic polyp    09/2007 - diminutive adenoma [name])  . PONV (postoperative nausea and vomiting)   . Tobacco abuse     Review of Systems:  All pertinents listed in HPI, otherwise negative  Physical Exam:  Vitals:   11/26/16 1011  BP: (!) 170/63  Pulse: 86  Temp: 97.5 F (36.4 C)  TempSrc: Oral  SpO2: 100%  Weight: 251 lb (113.9 kg)  Height: _0  (1.676 m)    Constitutional: NAD, appears comfortable Cardiovascular: RRR, no murmurs, rubs, or gallops.  Pulmonary/Chest: CTAB, no wheezes, rales, or rhonchi.  Abdominal: Soft, non tender, non distended. +BS.  Extremities: Warm and well perfused. Distal pulses intact. No edema.  Neurological: A&Ox3, CN II - XII grossly intact.   Assessment & Plan:   See Encounters Tab for problem based charting.  Patient discussed with Dr. Eppie Gibson

## 2016-11-27 ENCOUNTER — Telehealth: Payer: Self-pay | Admitting: *Deleted

## 2016-11-27 NOTE — Telephone Encounter (Signed)
Unable to reach patient - VM box full.

## 2016-11-27 NOTE — Telephone Encounter (Signed)
PA submitted online via Cover My Meds.  Request sent for "review".  Pt may be required to try and fail Wellbutrin first.       Kathleen Stone  Key: [id number] -  PA Case ID: [id number]  Outcome: Pending Review

## 2016-11-27 NOTE — Progress Notes (Signed)
Case discussed with Dr. Philipp Ovens at the time of the visit.  We reviewed the resident's history and exam and pertinent patient test results.  I agree with the assessment, diagnosis, and plan of care documented in the resident's note.  If PFTs are unremarkable for an explanation for her dyspnea we will reconsider the possibility of chronic diastolic heart failure given her grade II diastolic dysfunction.

## 2016-11-29 NOTE — Telephone Encounter (Signed)
Left message for pt to call.

## 2016-11-30 NOTE — Telephone Encounter (Signed)
FORWARD TO SCHEDULER   STRESS TEST NOT SCHEDULE PATIENT HAS AN APPOINTMENT 12/04/16 MAY NEED TO CANCEL DO TEST AND THEN DO A FOLLOW UP VISIT

## 2016-12-03 NOTE — Progress Notes (Deleted)
Cardiology Office Note   Date:  12/03/2016   ID:  Kathleen Stone, DOB Nov 29, 1965, MRN [id number]  PCP:  Minus [place], MD  Cardiologist:   Minus Breeding, MD  Referring:  ***  No chief complaint on file.     History of Present Illness: Kathleen Stone is a 51 y.o. female who presents for ***  The patient was seen in consultation when she was in the hospital in early Feb.   She was felt to have atypical chest pain and was sent home for stress testing.  Echo was unremarkable.  ***  Past Medical History:  Diagnosis Date  . Chronic lower back pain    Negative MRI of hips bilaterally, neg CT scan of A and P; MRI of T and L spine-> very mild deg changes from T4-5 and T7-8, cervical spondylotic changes at C5-6, very mild disc bulge at L2-3 without spinal stenosis, nl ESR, ANA, RF, HIV, TSH, and B12  . DJD (degenerative joint disease) of cervical spine   . Family history of adverse reaction to anesthesia    "sisters PONV"  . Galactorrhea of both breasts 2009   R. sided w/ benign papilloma excised in 4/09. L sided in 5/09  . Hypertension   . Migraine    "monthly" (11/15/2016)  . Neuropathy (HCC)    Thought to be 2/2 nerve compression in the C-spine  . Obesity   . Personal history of adenomatous colonic polyp    09/2007 - diminutive adenoma [name])  . PONV (postoperative nausea and vomiting)   . Tobacco abuse     Past Surgical History:  Procedure Laterality Date  . APPENDECTOMY    . BREAST SURGERY Right 12/2007   central duct excision  . COLONOSCOPY W/ BIOPSIES AND POLYPECTOMY  09/2007   Dr. [name]. 2 small adenomatous polyps, internal hemorrhoids-Grade 1. Rec  routine colonoscopy at age 24.  Marland Kitchen COLONOSCOPY W/ BIOPSIES AND POLYPECTOMY    . NASAL SEPTOPLASTY W/ TURBINOPLASTY Bilateral 04/2008   Septoplasty with bilateral inferior turbinate reduction; Dr. Lucia Gaskins  . SHOULDER OPEN ROTATOR CUFF REPAIR Right     after hurting it by moving heavy objecs     Current  Outpatient Prescriptions  Medication Sig Dispense Refill  . amLODipine (NORVASC) 5 MG tablet Take by mouth.    Marland Kitchen atorvastatin (LIPITOR) 40 MG tablet Take 1 tablet (40 mg total) by mouth daily at 6 PM. 30 tablet 3  . gabapentin (NEURONTIN) 300 MG capsule Take 1 capsule (300 mg total) by mouth 2 (two) times daily. 60 capsule 2  . varenicline (CHANTIX CONTINUING MONTH PAK) 1 MG tablet Take 1 tablet (1 mg total) by mouth 2 (two) times daily. Start taking in 1 week after finishing prescription for 0.5 mg pills. 60 tablet 2  . varenicline (CHANTIX) 0.5 MG tablet Take 1 tablet (0.5 mg total) by mouth 2 (two) times daily. Take 1 pill daily on days 1, 2, and 3.  Then take 1 pill twice daily on days 4-7. 11 tablet 0   No current facility-administered medications for this visit.     Allergies:   Iohexol    ROS:  Please see the history of present illness.   Otherwise, review of systems are positive for {NONE DEFAULTED:18576::"none"}.   All other systems are reviewed and negative.    PHYSICAL EXAM: VS:  There were no vitals taken for this visit. , BMI There is no height or weight on file to calculate BMI. GENERAL:  Well  appearing HEENT:  Pupils equal round and reactive, fundi not visualized, oral mucosa unremarkable NECK:  No jugular venous distention, waveform within normal limits, carotid upstroke brisk and symmetric, no bruits, no thyromegaly LYMPHATICS:  No cervical, inguinal adenopathy LUNGS:  Clear to auscultation bilaterally BACK:  No CVA tenderness CHEST:  Unremarkable HEART:  PMI not displaced or sustained,S1 and S2 within normal limits, no S3, no S4, no clicks, no rubs, *** murmurs ABD:  Flat, positive bowel sounds normal in frequency in pitch, no bruits, no rebound, no guarding, no midline pulsatile mass, no hepatomegaly, no splenomegaly EXT:  2 plus pulses throughout, no edema, no cyanosis no clubbing SKIN:  No rashes no nodules NEURO:  Cranial nerves II through XII grossly intact, motor  grossly intact throughout PSYCH:  Cognitively intact, oriented to person place and time    EKG:  EKG {ACTION; IS/IS PJK:93267124} ordered today. The ekg ordered today demonstrates ***   Recent Labs: 11/15/2016: BUN 16; Creatinine, Ser 1.03; Hemoglobin 14.2; Platelets 248; Potassium 4.4; Sodium 140 11/16/2016: TSH 1.724    Lipid Panel    Component Value Date/Time   CHOL 175 11/16/2016 0816   TRIG 166 (H) 11/16/2016 0816   HDL 35 (L) 11/16/2016 0816   CHOLHDL 5.0 11/16/2016 0816   VLDL 33 11/16/2016 0816   LDLCALC 107 (H) 11/16/2016 0816      Wt Readings from Last 3 Encounters:  11/26/16 251 lb (113.9 kg)  11/16/16 245 lb 9.6 oz (111.4 kg)  06/13/16 248 lb 3.2 oz (112.6 kg)      Other studies Reviewed: Additional studies/ records that were reviewed today include: ***. Review of the above records demonstrates:  Please see elsewhere in the note.  ***   ASSESSMENT AND PLAN:  ***   Current medicines are reviewed at length with the patient today.  The patient {ACTIONS; HAS/DOES NOT HAVE:19233} concerns regarding medicines.  The following changes have been made:  {PLAN; NO CHANGE:13088:s}  Labs/ tests ordered today include: *** No orders of the defined types were placed in this encounter.    Disposition:   FU with ***    Signed, Minus Breeding, MD  12/03/2016 7:59 AM    [place]

## 2016-12-04 ENCOUNTER — Ambulatory Visit: Payer: 59 | Admitting: Cardiology

## 2016-12-04 ENCOUNTER — Encounter: Payer: Self-pay | Admitting: *Deleted

## 2016-12-07 ENCOUNTER — Telehealth: Payer: Self-pay | Admitting: Internal Medicine

## 2016-12-07 NOTE — Telephone Encounter (Signed)
APT. REMINDER CALL, LMTCB °

## 2016-12-10 ENCOUNTER — Encounter: Payer: 59 | Admitting: Internal Medicine

## 2016-12-13 ENCOUNTER — Telehealth (HOSPITAL_COMMUNITY): Payer: Self-pay

## 2016-12-13 NOTE — Telephone Encounter (Signed)
Encounter complete. 

## 2016-12-14 ENCOUNTER — Telehealth (HOSPITAL_COMMUNITY): Payer: Self-pay

## 2016-12-14 NOTE — Telephone Encounter (Signed)
Encounter complete. 

## 2016-12-18 ENCOUNTER — Ambulatory Visit (HOSPITAL_COMMUNITY)
Admission: RE | Admit: 2016-12-18 | Discharge: 2016-12-18 | Disposition: A | Discharge status: Home / Self Care | Payer: 59 | Source: Ambulatory Visit | Attending: Cardiovascular Disease | Admitting: Cardiovascular Disease

## 2016-12-18 ENCOUNTER — Encounter (HOSPITAL_COMMUNITY): Payer: Self-pay

## 2016-12-18 DIAGNOSIS — R0789 Other chest pain: Secondary | ICD-10-CM

## 2016-12-19 ENCOUNTER — Inpatient Hospital Stay (HOSPITAL_COMMUNITY): Admission: RE | Admit: 2016-12-19 | Payer: 59 | Source: Ambulatory Visit

## 2016-12-27 ENCOUNTER — Telehealth (HOSPITAL_COMMUNITY): Payer: Self-pay

## 2016-12-27 NOTE — Telephone Encounter (Signed)
Encounter complete. 

## 2016-12-28 ENCOUNTER — Telehealth (HOSPITAL_COMMUNITY): Payer: Self-pay

## 2016-12-28 NOTE — Telephone Encounter (Signed)
Encounter complete. 

## 2017-01-01 ENCOUNTER — Ambulatory Visit (HOSPITAL_COMMUNITY)
Admission: RE | Admit: 2017-01-01 | Discharge: 2017-01-01 | Disposition: A | Discharge status: Home / Self Care | Payer: 59 | Source: Ambulatory Visit | Attending: Cardiology | Admitting: Cardiology

## 2017-01-01 DIAGNOSIS — E669 Obesity, unspecified: Secondary | ICD-10-CM | POA: Diagnosis not present

## 2017-01-01 DIAGNOSIS — Z8249 Family history of ischemic heart disease and other diseases of the circulatory system: Secondary | ICD-10-CM | POA: Diagnosis not present

## 2017-01-01 DIAGNOSIS — R0789 Other chest pain: Secondary | ICD-10-CM | POA: Insufficient documentation

## 2017-01-01 DIAGNOSIS — F17200 Nicotine dependence, unspecified, uncomplicated: Secondary | ICD-10-CM | POA: Insufficient documentation

## 2017-01-01 MED ORDER — REGADENOSON 0.4 MG/5ML IV SOLN
0.4000 mg | Freq: Once | INTRAVENOUS | Status: AC
  Administered 2017-01-01: 0.4 mg via INTRAVENOUS

## 2017-01-01 MED ORDER — TECHNETIUM TC 99M TETROFOSMIN IV KIT
31.2000 | PACK | Freq: Once | INTRAVENOUS | Status: AC | PRN
  Administered 2017-01-01: 31.2 via INTRAVENOUS
  Filled 2017-01-01: qty 32

## 2017-01-02 ENCOUNTER — Ambulatory Visit (HOSPITAL_COMMUNITY)
Admission: RE | Admit: 2017-01-02 | Discharge: 2017-01-02 | Disposition: A | Discharge status: Home / Self Care | Payer: 59 | Source: Ambulatory Visit | Attending: Cardiovascular Disease | Admitting: Cardiovascular Disease

## 2017-01-02 LAB — MYOCARDIAL PERFUSION IMAGING
LV dias vol: 88 mL (ref 46–106)
LVSYSVOL: 31 mL
Peak HR: 90 {beats}/min
Rest HR: 60 {beats}/min
SDS: 4
SRS: 0
SSS: 4
TID: 1.03

## 2017-01-02 MED ORDER — TECHNETIUM TC 99M TETROFOSMIN IV KIT
31.7000 | PACK | Freq: Once | INTRAVENOUS | Status: AC | PRN
  Administered 2017-01-02: 31.7 via INTRAVENOUS

## 2017-01-03 ENCOUNTER — Telehealth: Payer: Self-pay | Admitting: Cardiology

## 2017-01-03 NOTE — Telephone Encounter (Signed)
Pt aware of her stress test 

## 2017-01-03 NOTE — Telephone Encounter (Signed)
New Message    Pt returning your call about the Echo results

## 2017-01-07 ENCOUNTER — Ambulatory Visit: Payer: 59

## 2017-01-11 ENCOUNTER — Ambulatory Visit (HOSPITAL_COMMUNITY): Admission: RE | Admit: 2017-01-11 | Payer: 59 | Source: Ambulatory Visit

## 2017-01-15 NOTE — Progress Notes (Signed)
Cardiology Office Note    Date:  01/16/2017   ID:  Kathleen Stone, DOB 05/04/1966, MRN [id number]  PCP:  Minus Liberty, MD  Cardiologist: Dr. Percival Spanish   Chief Complaint  Patient presents with  . Follow-up    F/U after stress test.    History of Present Illness:    Kathleen Stone is a 51 y.o. female with past medical history of HTN, tobacco use, and syncope who presents to the office today for follow-up of her stress test.   She was admitted from 3/1-11/16/2016 for evaluation of chest pain. Reported having episodes of chest pain at rest for the past several days, not made worse with exertion. She had slight ST elevation along her anterior leads but troponin values remained negative. Echocardiogram showed a preserved EF of 65-70% with Grade 2 DD and no WMA. A stress test was recommended as an outpatient and was obtained on 4/18 which showed a medium defect of mild severity in the basal anterior, mid anterior, and apical anterior location which was reversible but thought to be most consistent with breast attenuation.   In talking with the patient today, she reports continual episodes of dyspnea on exertion ever since her hospitalization in March. 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 She was able to perform these activities without any symptoms prior to her hospitalization. She denies any associated chest discomfort, palpitations, lightheadedness, dizziness, or presyncope. No orthopnea, PND, or lower extremity edema.  She does continue to smoke one pack per day and has done so for 20+ years. She has been prescribed Chantix by her PCP but is awaiting insurance approval for this.  Past Medical History:  Diagnosis Date  . Chronic lower back pain    Negative MRI of hips bilaterally, neg CT scan of A and P; MRI of T and L spine-> very mild deg changes from T4-5 and T7-8, cervical spondylotic changes at C5-6, very mild disc bulge at L2-3  without spinal stenosis, nl ESR, ANA, RF, HIV, TSH, and B12  . DJD (degenerative joint disease) of cervical spine   . Family history of adverse reaction to anesthesia    "sisters PONV"  . Galactorrhea of both breasts 2009   R. sided w/ benign papilloma excised in 4/09. L sided in 5/09  . Hypertension   . Migraine    "monthly" (11/15/2016)  . Neuropathy    Thought to be 2/2 nerve compression in the C-spine  . Obesity   . Personal history of adenomatous colonic polyp    09/2007 - diminutive adenoma [name])  . PONV (postoperative nausea and vomiting)   . Tobacco abuse     Past Surgical History:  Procedure Laterality Date  . APPENDECTOMY    . BREAST SURGERY Right 12/2007   central duct excision  . COLONOSCOPY W/ BIOPSIES AND POLYPECTOMY  09/2007   Dr. [name]. 2 small adenomatous polyps, internal hemorrhoids-Grade 1. Rec  routine colonoscopy at age 41.  Marland Kitchen COLONOSCOPY W/ BIOPSIES AND POLYPECTOMY    . NASAL SEPTOPLASTY W/ TURBINOPLASTY Bilateral 04/2008   Septoplasty with bilateral inferior turbinate reduction; Dr. Lucia Gaskins  . SHOULDER OPEN ROTATOR CUFF REPAIR Right     after hurting it by moving heavy objecs    Current Medications: Outpatient Medications Prior to Visit  Medication Sig Dispense Refill  . amLODipine (NORVASC) 5 MG tablet Take by mouth.    Marland Kitchen atorvastatin (LIPITOR) 40 MG tablet Take 1 tablet (40 mg  total) by mouth daily at 6 PM. 30 tablet 3  . gabapentin (NEURONTIN) 300 MG capsule Take 1 capsule (300 mg total) by mouth 2 (two) times daily. 60 capsule 2  . varenicline (CHANTIX) 0.5 MG tablet Take 1 tablet (0.5 mg total) by mouth 2 (two) times daily. Take 1 pill daily on days 1, 2, and 3.  Then take 1 pill twice daily on days 4-7. 11 tablet 0   No facility-administered medications prior to visit.      Allergies:   Iohexol   Social History   Social History  . Marital status: Single    Spouse name: N/A  . Number of children: N/A  . Years of education: N/A   Social  History Main Topics  . Smoking status: Current Every Day Smoker    Packs/day: 0.50    Years: 31.00    Types: Cigarettes  . Smokeless tobacco: Never Used  . Alcohol use No  . Drug use: No  . Sexual activity: Yes   Other Topics Concern  . None   Social History Narrative   Domestic Partner: female has emphysema   Smoked 2 packs/day for 20+ years, down to 1/2ppd now, willing to try to quit    Alcohol use-no   Drug use-no   Regular exercise-no     Family History:  The patient's family history includes Diabetes in her mother; Heart failure in her mother; Lung cancer in her father.   Review of Systems:   Please see the history of present illness.     General:  No chills, fever, night sweats or weight changes.  Cardiovascular:  No chest pain, edema, orthopnea, palpitations, paroxysmal nocturnal dyspnea. Positive for dyspnea on exertion.  Dermatological: No rash, lesions/masses Respiratory: No cough, dyspnea Urologic: No hematuria, dysuria Abdominal:   No nausea, vomiting, diarrhea, bright red blood per rectum, melena, or hematemesis Neurologic:  No visual changes, wkns, changes in mental status. All other systems reviewed and are otherwise negative except as noted above.   Physical Exam:    VS:  BP 134/80   Pulse 68   Ht '5\' 4"'  (1.626 m)   Wt 250 lb (113.4 kg)   BMI 42.91 kg/m    General: Well developed, overweight African American female appearing in no acute distress. Head: Normocephalic, atraumatic, sclera non-icteric, no xanthomas, nares are without discharge.  Neck: No carotid bruits. JVD not elevated.  Lungs: Respirations regular and unlabored, without wheezes or rales.  Heart: Regular rate and rhythm. No S3 or S4.  No murmur, no rubs, or gallops appreciated. Abdomen: Soft, non-tender, non-distended with normoactive bowel sounds. No hepatomegaly. No rebound/guarding. No obvious abdominal masses. Msk:  Strength and tone appear normal for age. No joint deformities or  effusions. Extremities: No clubbing or cyanosis. Trace bilateral lower extremity edema.  Distal pedal pulses are 2+ bilaterally. Neuro: Alert and oriented X 3. Moves all extremities spontaneously. No focal deficits noted. Psych:  Responds to questions appropriately with a normal affect. Skin: No rashes or lesions noted  Wt Readings from Last 3 Encounters:  01/16/17 251 lb 6.4 oz (114 kg)  01/16/17 250 lb (113.4 kg)  11/26/16 251 lb (113.9 kg)     Studies/Labs Reviewed:   EKG:  EKG is not ordered today.   Recent Labs: 11/15/2016: BUN 16; Creatinine, Ser 1.03; Hemoglobin 14.2; Platelets 248; Potassium 4.4; Sodium 140 11/16/2016: TSH 1.724   Lipid Panel    Component Value Date/Time   CHOL 175 11/16/2016 0816   TRIG  166 (H) 11/16/2016 0816   HDL 35 (L) 11/16/2016 0816   CHOLHDL 5.0 11/16/2016 0816   VLDL 33 11/16/2016 0816   LDLCALC 107 (H) 11/16/2016 0816    Additional studies/ records that were reviewed today include:   Echocardiogram: 11/2016 Study Conclusions  - Left ventricle: The cavity size was normal. Wall thickness was   increased in a pattern of severe LVH. Systolic function was   vigorous. The estimated ejection fraction was in the range of 65%   to 70%. Wall motion was normal; there were no regional wall   motion abnormalities. Features are consistent with a pseudonormal   left ventricular filling pattern, with concomitant abnormal   relaxation and increased filling pressure (grade 2 diastolic   dysfunction). Doppler parameters are consistent with high   ventricular filling pressure. - Left atrium: The atrium was mildly dilated. - Pericardium, extracardiac: A trivial pericardial effusion was   identified.  Impressions:  - Vigorous LV systolic function; severe LVH; grade 2 diastolic   dysfunction with elevated LV filling pressure; mild LAE.  NST: 12/2016  The left ventricular ejection fraction is normal (55-65%).  Nuclear stress EF: 64%.  This is a  low risk study.  There is a medium defect of very mild severity present in the basal anterior, mid anterior and apical anterior location. The defect is reversible but likely represents variations in breast attenuation artifact but cannot rule out subtle ischemia.  Assessment:    1. Abnormal stress test   2. DOE (dyspnea on exertion)   3. Essential hypertension   4. Tobacco use      Plan:   In order of problems listed above:  1. Abnormal Stress Test/ Dyspnea on exertion - recently admitted for evaluation of chest pain. EKG showed slight ST elevation along her anterior leads but troponin values remained negative. Echo with a preserved EF of 65-70% with Grade 2 DD and no WMA. Outpatient NST was performed on 4/18 and showed a medium defect of mild severity in the basal anterior, mid anterior, and apical anterior location which was reversible but thought to be most consistent with breast attenuation.  - she has continued to experience worsening dyspnea on exertion ever since her initial event in 11/2016. She denies any associated chest pain, palpitations, lightheadedness, dizziness, or presyncope. No orthopnea, PND, or lower extremity edema. - will obtain a Coronary CT for further evaluation with her continued symptoms and abnormal stress test as she does have cardiac risk factors including HTN, Tobacco use, and family history of CAD. If Coronary CT abnormal, she would need a cardiac catheterization for definitive evaluation.    2. HTN - BP well-controlled at 134/80 during today's visit.  - continue Amlodipine 74m daily.    3. Tobacco Use - has a 20+ pack-year history.  - cessation advised. Awaiting approval by her insurance for Chantix.   Medication Adjustments/Labs and Tests Ordered: Current medicines are reviewed at length with the patient today.  Concerns regarding medicines are outlined above.  Medication changes, Labs and Tests ordered today are listed in the Patient Instructions  below. Patient Instructions  Medication Instructions:  Your physician recommends that you continue on your current medications as directed. Please refer to the Current Medication list given to you today.  TAKE NITRO-AS NEEDED FOR CHEST PAIN  If you need a refill on your cardiac medications before your next appointment, please call your pharmacy.  Testing/Procedures: Your physician has requested that you have cardiac CT. Cardiac computed tomography (CT) is  a painless test that uses an x-ray machine to take clear, detailed pictures of your heart. For further information please visit HugeFiesta.tn. Please follow instruction sheet as given.  Follow-Up: Your physician wants you to follow-up in: [place] DR HOCHREIN--OR SOONER IF STRESS TEST IS ABNORMAL You will receive a reminder letter in the mail two months in advance. If you don't receive a letter, please call our office September 2018 to schedule the November 2018 follow-up appointment.   Signed, Erma Heritage, PA-C  01/16/2017 3:21 PM    [facility] [place], [place], [place]  [address] Phone: [phone]; Fax: [phone]  [address] [place], [place] [address] Phone: [phone]

## 2017-01-16 ENCOUNTER — Ambulatory Visit (INDEPENDENT_AMBULATORY_CARE_PROVIDER_SITE_OTHER): Payer: 59 | Admitting: Internal Medicine

## 2017-01-16 ENCOUNTER — Ambulatory Visit (INDEPENDENT_AMBULATORY_CARE_PROVIDER_SITE_OTHER): Payer: 59 | Admitting: Student

## 2017-01-16 ENCOUNTER — Encounter: Payer: Self-pay | Admitting: Student

## 2017-01-16 VITALS — BP 134/80 | HR 68 | Ht 64.0 in | Wt 250.0 lb

## 2017-01-16 VITALS — BP 142/53 | HR 65 | Temp 97.9°F | Ht 66.0 in | Wt 251.4 lb

## 2017-01-16 DIAGNOSIS — R0609 Other forms of dyspnea: Secondary | ICD-10-CM | POA: Diagnosis not present

## 2017-01-16 DIAGNOSIS — I10 Essential (primary) hypertension: Secondary | ICD-10-CM | POA: Diagnosis not present

## 2017-01-16 DIAGNOSIS — E669 Obesity, unspecified: Secondary | ICD-10-CM

## 2017-01-16 DIAGNOSIS — F17210 Nicotine dependence, cigarettes, uncomplicated: Secondary | ICD-10-CM | POA: Diagnosis not present

## 2017-01-16 DIAGNOSIS — R9439 Abnormal result of other cardiovascular function study: Secondary | ICD-10-CM

## 2017-01-16 DIAGNOSIS — Z720 Tobacco use: Secondary | ICD-10-CM

## 2017-01-16 DIAGNOSIS — Z5189 Encounter for other specified aftercare: Secondary | ICD-10-CM | POA: Diagnosis not present

## 2017-01-16 DIAGNOSIS — R202 Paresthesia of skin: Secondary | ICD-10-CM

## 2017-01-16 DIAGNOSIS — R0602 Shortness of breath: Secondary | ICD-10-CM | POA: Diagnosis not present

## 2017-01-16 DIAGNOSIS — Z0000 Encounter for general adult medical examination without abnormal findings: Secondary | ICD-10-CM

## 2017-01-16 HISTORY — DX: Abnormal result of other cardiovascular function study: R94.39

## 2017-01-16 MED ORDER — NITROGLYCERIN 0.4 MG SL SUBL: 0.4000 mg | SUBLINGUAL_TABLET | SUBLINGUAL | 1 refills | Status: DC | PRN

## 2017-01-16 NOTE — Patient Instructions (Addendum)
Kathleen Stone,  For your shortness of breath, we need to obtain a lung function test called PFTs (Pulmonary Function Testing). It is also important that you stop smoking. Our pharmacists is working on getting your Chantix approved. For your arm numbness and tingling, we also need to get an MRI of your neck. Once you have your schedule, please give [place] a call so we can help schedule these tests for you. If you have any questions or concerns, call our clinic at [phone] or after hours call [phone] and ask for the internal medicine resident on call. Thank you!  - Dr. Philipp Ovens  Sleep Hygiene:   Maintain a regular sleep routine Go to bed at the same time. Wake up at the same time. Ideally, your schedule will remain the same (+/- 20 minutes) every night of the week. Avoid naps if possible Naps decrease the 'Sleep Debt' that is so necessary for easy sleep onset.  Each of [place] needs a certain amount of sleep per 24-hour period. We need that amount, and we don't need more than that.  When we take naps, it decreases the amount of sleep that we need the next night - which may cause sleep fragmentation and difficulty initiating sleep, and may lead to insomnia. Don't stay in bed awake for more than 5-10 minutes. If you find your mind racing, or worrying about not being able to sleep during the middle of the night, get out of bed, and sit in a chair in the dark. Do your mind racing in the chair until you are sleepy, then return to bed. No TV or internet during these periods! That will just stimulate you more than desired. If this happens several times during the night, that is OK. Just maintain your regular wake time, and try to avoid naps. Don't watch TV or read in bed. When you watch TV or read in bed, you associate the bed with wakefulness.  The bed is reserved for two things - sleep and hanky panky. Drink caffeinated drinks with caution The effects of caffeine may last for several hours after ingestion.  Caffeine can fragment sleep, and cause difficulty initiating sleep. If you drink caffeine, use it only before noon.  Remember that soda and tea contain caffeine as well.  Avoid inappropriate substances that interfere with sleep Cigarettes, alcohol, and over-the-counter medications may cause fragmented sleep. Exercise regularly Exercise before 2 pm every day. Exercise promotes continuous sleep.  Avoid rigorous exercise before bedtime. Rigorous exercise circulates endorphins into the body which may cause difficulty initiating sleep. Have a quiet, comfortable bedroom Set your bedroom thermostat at a comfortable temperature. Generally, a little cooler is better than a little warmer.  Turn off the TV and other extraneous noise that may disrupt sleep. Background 'white noise' like a fan is OK.  If your pets awaken you, keep them outside the bedroom.  Your bedroom should be dark. Turn off bright lights.  Have a comfortable mattress. If you are a 'clock watcher' at night, hide the clock. Have a comfortable pre-bedtime routine A warm bath, shower  Meditation, or quiet time Some who are struggling with sleep regularly find it helpful to print out these recommendations and read them regularly. If you accidentally miss some of recommendations, or have a bad night, do not fret. By following these sleep hygiene recommendations, you will help yourself to get into a routine that promotes good sleep opportunities.

## 2017-01-16 NOTE — Assessment & Plan Note (Addendum)
Patient refused Pap smear today.

## 2017-01-16 NOTE — Progress Notes (Signed)
   CC: SOB follow up   HPI:  [name] is a 51 y.o. female with past medical history outlined below here for follow up of her shortness of breath. For the details of today's visit, please refer to the assessment and plan.  Past Medical History:  Diagnosis Date  . Chronic lower back pain    Negative MRI of hips bilaterally, neg CT scan of A and P; MRI of T and L spine-> very mild deg changes from T4-5 and T7-8, cervical spondylotic changes at C5-6, very mild disc bulge at L2-3 without spinal stenosis, nl ESR, ANA, RF, HIV, TSH, and B12  . DJD (degenerative joint disease) of cervical spine   . Family history of adverse reaction to anesthesia    "sisters PONV"  . Galactorrhea of both breasts 2009   R. sided w/ benign papilloma excised in 4/09. L sided in 5/09  . Hypertension   . Migraine    "monthly" (11/15/2016)  . Neuropathy    Thought to be 2/2 nerve compression in the C-spine  . Obesity   . Personal history of adenomatous colonic polyp    09/2007 - diminutive adenoma [name])  . PONV (postoperative nausea and vomiting)   . Tobacco abuse     Review of Systems:  All pertinents listed in HPI, otherwise negative  Physical Exam:  Vitals:   01/16/17 1059  BP: (!) 142/53  Pulse: 65  Temp: 97.9 F (36.6 C)  TempSrc: Oral  SpO2: 100%  Weight: 251 lb 6.4 oz (114 kg)  Height: '5\' 6"'$  (1.676 m)    Constitutional: Obese, NAD, appears comfortable Cardiovascular: RRR, no murmurs, rubs, or gallops.  Pulmonary/Chest: CTAB, no wheezes, rales, or rhonchi.  Abdominal: Soft, non tender, non distended. +BS.  Extremities: Warm and well perfused.  No edema.  Neurological: A&Ox3, CN II - XII grossly intact.   Assessment & Plan:   See Encounters Tab for problem based charting.  Patient discussed with Dr. Dareen Piano

## 2017-01-16 NOTE — Patient Instructions (Signed)
Medication Instructions:  Your physician recommends that you continue on your current medications as directed. Please refer to the Current Medication list given to you today.  TAKE NITRO-AS NEEDED FOR CHEST PAIN  If you need a refill on your cardiac medications before your next appointment, please call your pharmacy.  Testing/Procedures: Your physician has requested that you have cardiac CT. Cardiac computed tomography (CT) is a painless test that uses an x-ray machine to take clear, detailed pictures of your heart. For further information please visit HugeFiesta.tn. Please follow instruction sheet as given.  Follow-Up: Your physician wants you to follow-up in: 6 MONTHS WITH DR HOCHREIN--OR IF STRESS TEST IS ABNORMAL You will receive a reminder letter in the mail two months in advance. If you don't receive a letter, please call our office September 2018 to schedule the November 2018 follow-up appointment.  Special Instructions:  WE WILL CALL YOU WITH ABNORMAL STRESS TEST FINDINGS  Thank you for choosing [facility] at [facility]!!

## 2017-01-16 NOTE — Assessment & Plan Note (Signed)
Patient was seen in clinic 2 months ago for the same complaint. At that time there was some concern for cervical radiculopathy and MRI of her cervical spine is ordered. Unfortunately patient never completed this imaging. She continues to have intermittent paresthesias of her left upper extremity that radiates from her neck. She is taking gabapentin 300 mg twice a day with moderate relief. -- Continue gabapentin 300 mg BID  -- Obtain cervical MRI to evaluate for cord compression

## 2017-01-16 NOTE — Assessment & Plan Note (Signed)
Blood pressure today is uncontrolled, 142/53. However patient reports that she did not take her medications today. Encourage medication compliance. -- Continue amlodipine 5 mg daily -- Follow-up in 3 months

## 2017-01-16 NOTE — Progress Notes (Signed)
Internal Medicine Clinic Attending  Case discussed with Dr. Guilloud at the time of the visit.  We reviewed the resident's history and exam and pertinent patient test results.  I agree with the assessment, diagnosis, and plan of care documented in the resident's note.  

## 2017-01-16 NOTE — Assessment & Plan Note (Signed)
Patient is here today for follow-up of her shortness of breath. She was seen in clinic 2 months ago for the same complaint. At that time smoking cessation was discussed and patient was prescribed Chantix. Unfortunately, her insurance did not cover this medication. Today, she reports that she has increased her smoking from 1/2 pack to 1 pack per day. Pulmonary function testing was also ordered at her prior visit she has not yet completed. Exam today is reassuring. Her lungs are clear bilaterally without wheezing. She is breathing comfortably on room air with oxygen saturation 100%. Again we discussed the importance of smoking sensation on her overall health, but especially in the setting of her shortness of breath. Encouraged patient to complete PFTs. -- Obtain PFTs -- Smoking cessation  -- Dr. Maudie Mercury working on prior authorization for Chantix  -- F/u with cardiology for cardiac CT (see abnormal stress test A/P)

## 2017-01-16 NOTE — Assessment & Plan Note (Signed)
Patient was seen today by her cardiologist for follow-up of an abnormal stress test. She initially presented to the ED on 11/15/2016 for evaluation of atypical chest pain. EKG at that time showed slight ST elevation in her anterior leads and she was admitted overnight for ACS rule out. Troponins remained negative and she was discharged the following day with plans for outpatient stress test. This was obtained on 4/18 which showed a medium defect of mild severity in the basal anterior, mid anterior, and apical anterior location which was reversible but thought to be most consistent with breast attenuation. She was seen by cardiology today who has ordered a cardiac CT for further evaluation.  -- Cardiac CT -- F/u cardiology  -- PRN SL nitro for chest pain

## 2017-01-30 ENCOUNTER — Encounter (HOSPITAL_COMMUNITY): Payer: 59

## 2017-01-30 ENCOUNTER — Telehealth: Payer: Self-pay | Admitting: Cardiology

## 2017-01-30 ENCOUNTER — Ambulatory Visit (HOSPITAL_COMMUNITY): Admission: RE | Admit: 2017-01-30 | Payer: 59 | Source: Ambulatory Visit

## 2017-01-30 NOTE — Telephone Encounter (Signed)
Dr. Meda Coffee  - [name] PA [facility]) has ordered Cardiac Ct for Abn. Stress Test/Dyspena on exertion    Please see office note below from 01/16/17   Abnormal Stress Test/ Dyspnea on exertion - recently admitted for evaluation of chest pain. EKG showed slight ST elevation along her anterior leads but troponin values remained negative. Echo with a preserved EF of 65-70% with Grade 2 DD and no WMA. Outpatient NST was performed on 4/18 and showed a medium defect of mild severity in the basal anterior, mid anterior, and apical anterior location which was reversible but thought to be most consistent with breast attenuation.  - she has continued to experience worsening dyspnea on exertion ever since her initial event in 11/2016. She denies any associated chest pain, palpitations, lightheadedness, dizziness, or presyncope. No orthopnea, PND, or lower extremity edema. - will obtain a Coronary CT for further evaluation with her continued symptoms and abnormal stress test as she does have cardiac risk factors including HTN, Tobacco use, and family history of CAD. If Coronary CT abnormal, she would need a cardiac catheterization for definitive evaluation.    Please Review and advise?

## 2017-01-31 NOTE — Telephone Encounter (Signed)
This patient's BMI is 42, our current scanner would be suboptimal for her coronary imaging. If possible please wait until we receive our new scanner at the end June/July. If suspicion high, refer to cardiac catheterization. Thank you for understanding.  Ena Dawley

## 2017-02-04 ENCOUNTER — Ambulatory Visit (HOSPITAL_COMMUNITY): Admission: RE | Admit: 2017-02-04 | Payer: 59 | Source: Ambulatory Visit

## 2017-02-04 ENCOUNTER — Encounter (HOSPITAL_COMMUNITY): Payer: 59

## 2017-02-22 ENCOUNTER — Encounter: Payer: Self-pay | Admitting: *Deleted

## 2017-02-25 ENCOUNTER — Encounter: Payer: 59 | Admitting: Internal Medicine

## 2017-02-25 NOTE — Assessment & Plan Note (Deleted)
BP Readings from Last 3 Encounters:  01/16/17 (!) 142/53  01/16/17 134/80  11/26/16 (!) 161/71   Lab Results  Component Value Date   CREATININE 1.03 (H) 11/15/2016   Lab Results  Component Value Date   K 4.4 11/15/2016    Current medications: amlodipine 5 mg daily  Assessment BP goal: <140/90 BP control:   Plan Medications: Other:

## 2017-02-25 NOTE — Assessment & Plan Note (Deleted)
Pap smear

## 2017-02-25 NOTE — Assessment & Plan Note (Deleted)
   Prescribed Chanitx at last appointment 3 months ago.

## 2017-02-25 NOTE — Progress Notes (Deleted)
   CC: ***  HPI:  [name] is a 51 y.o. woman with history of hypertension, chronic back pain, and HTN who presents for management of hypertension.  Please see A&P for status of the patient's chronic medical conditions.   Past Medical History:  Diagnosis Date  . Chronic lower back pain    Negative MRI of hips bilaterally, neg CT scan of A and P; MRI of T and L spine-> very mild deg changes from T4-5 and T7-8, cervical spondylotic changes at C5-6, very mild disc bulge at L2-3 without spinal stenosis, nl ESR, ANA, RF, HIV, TSH, and B12  . DJD (degenerative joint disease) of cervical spine   . Family history of adverse reaction to anesthesia    "sisters PONV"  . Galactorrhea of both breasts 2009   R. sided w/ benign papilloma excised in 4/09. L sided in 5/09  . Hypertension   . Migraine    "monthly" (11/15/2016)  . Neuropathy    Thought to be 2/2 nerve compression in the C-spine  . Obesity   . Personal history of adenomatous colonic polyp    09/2007 - diminutive adenoma [name])  . PONV (postoperative nausea and vomiting)   . Tobacco abuse     Review of Systems:  *** ROS   Physical Exam:  There were no vitals filed for this visit. Physical Exam  ***  Assessment & Plan:   See Encounters Tab for problem based charting.  Patient {GC/GE:3044014::"discussed with","seen with"} Dr. {NAMES:[id number]::"Butcher","Granfortuna","E. Hoffman","Klima","Mullen","Narendra","Vincent"}

## 2017-03-29 ENCOUNTER — Ambulatory Visit (INDEPENDENT_AMBULATORY_CARE_PROVIDER_SITE_OTHER): Payer: 59 | Admitting: Internal Medicine

## 2017-03-29 ENCOUNTER — Encounter: Payer: Self-pay | Admitting: Internal Medicine

## 2017-03-29 VITALS — BP 178/67 | HR 77 | Temp 98.1°F | Ht 66.0 in | Wt 259.2 lb

## 2017-03-29 DIAGNOSIS — N644 Mastodynia: Secondary | ICD-10-CM

## 2017-03-29 MED ORDER — TRAMADOL HCL 50 MG PO TABS: 50.0000 mg | ORAL_TABLET | Freq: Four times a day (QID) | ORAL | 0 refills | Status: DC | PRN

## 2017-03-29 NOTE — Progress Notes (Signed)
   CC: Right breast pain  HPI:  [name] is a 51 y.o. woman with history noted below that presents to the Internal Medicine clinic for right breast pain that started 3-4 days ago.  She describes the pain as sharp and burning, first noticed when laying down and going to bed.  She has tried ibuprofen with no benefit.  Putting pressure on the breast makes the pain worse.  Nothing makes the pain better.  She states the pain is deep.  She denies trauma, nipple discharge, fever/chills, N/V or changes in appearance to the breast.     Past Medical History:  Diagnosis Date  . Chronic lower back pain    Negative MRI of hips bilaterally, neg CT scan of A and P; MRI of T and L spine-> very mild deg changes from T4-5 and T7-8, cervical spondylotic changes at C5-6, very mild disc bulge at L2-3 without spinal stenosis, nl ESR, ANA, RF, HIV, TSH, and B12  . DJD (degenerative joint disease) of cervical spine   . Family history of adverse reaction to anesthesia    "sisters PONV"  . Galactorrhea of both breasts 2009   R. sided w/ benign papilloma excised in 4/09. L sided in 5/09  . Hypertension   . Migraine    "monthly" (11/15/2016)  . Neuropathy    Thought to be 2/2 nerve compression in the C-spine  . Obesity   . Personal history of adenomatous colonic polyp    09/2007 - diminutive adenoma Kathleen Stone)  . PONV (postoperative nausea and vomiting)   . Tobacco abuse     Review of Systems:  Review of Systems  All other systems reviewed and are negative. As noted per HPI   Physical Exam:  Vitals:   03/29/17 1613  BP: (!) 178/67  Pulse: 77  Temp: 98.1 F (36.7 C)  TempSrc: Oral  SpO2: 99%  Weight: 259 lb 3.2 oz (117.6 kg)  Height: _0  (1.676 m)   Physical Exam  Constitutional:  Mild discomfort  Pulmonary/Chest: Effort normal. No respiratory distress.  Skin: Skin is warm and dry. No erythema.  Right breast tender to palpation throughout breast Right nipple more prominent compared  to left    Assessment & Plan:   See encounters tab for problem based medical decision making.   Patient seen with Dr. Dareen Piano

## 2017-03-29 NOTE — Assessment & Plan Note (Signed)
Assessment:  Right breast pain Patient presents with a 3-4 day history of breast pain.  Breast is tender to palpation with no erythema or discharge of the nipple however nipple is more prominent than the left nipple.  Patient's Last mammogram was 11/2015 and there were no findings suspicious for malignancy at the time. At this time I don't think patient has an infection as there is no erythema and patient is afebrile.  Patient could possibly have a peri-ductal mastitis.  I am most concerned for inflammatory breast cancer.  Will get a diagnostic mammogram and breast ultrasound.    Plan -tramadol -Diagnostic mammogram -Right breast ultrasound

## 2017-03-29 NOTE — Patient Instructions (Signed)
Ms. Harden,  It was a pleasure meeting you today. A referral has been for your mammogram. Please call the clinic next week if someone hasn't called you to schedule your mammogram appointment.

## 2017-04-02 NOTE — Progress Notes (Signed)
Internal Medicine Clinic Attending  I saw and evaluated the patient.  I personally confirmed the key portions of the history and exam documented by Dr. Heber [place] and I reviewed pertinent patient test results.  The assessment, diagnosis, and plan were formulated together and I agree with the documentation in the resident's note.  Patient has tenderness on breast exam but no erythema or increased local warmth. Patient with no visible discharge from the nipple and no palpable mass. Will need further evaluation with a mammogram and breast ultrasound

## 2017-04-03 ENCOUNTER — Telehealth: Payer: Self-pay | Admitting: *Deleted

## 2017-04-03 NOTE — Telephone Encounter (Signed)
Pt states she has started to have a brownish liquid dripping from her R nipple, states she has a "breast fever", states the breast is hot, wants to know how to stop the drainage, she is informed just to put a clean bath cloth over the breast for tonight and will be seen in [place] in am, she states her breast hurts so bad now she cannot put a bra on, she is informed that if she develops a high fever-above 102 orally- severe h/a, N&V, chest pain or shortness of breath to go to ED tonight otherwise will be seen in clinic, she is agreeable

## 2017-04-03 NOTE — Telephone Encounter (Signed)
Agree. thanks

## 2017-04-04 ENCOUNTER — Ambulatory Visit (INDEPENDENT_AMBULATORY_CARE_PROVIDER_SITE_OTHER): Payer: 59 | Admitting: Internal Medicine

## 2017-04-04 VITALS — BP 151/57 | HR 67 | Temp 98.1°F | Ht 66.5 in | Wt 251.2 lb

## 2017-04-04 DIAGNOSIS — Z8249 Family history of ischemic heart disease and other diseases of the circulatory system: Secondary | ICD-10-CM | POA: Diagnosis not present

## 2017-04-04 DIAGNOSIS — N644 Mastodynia: Secondary | ICD-10-CM

## 2017-04-04 DIAGNOSIS — G629 Polyneuropathy, unspecified: Secondary | ICD-10-CM

## 2017-04-04 DIAGNOSIS — F17200 Nicotine dependence, unspecified, uncomplicated: Secondary | ICD-10-CM | POA: Diagnosis not present

## 2017-04-04 DIAGNOSIS — I10 Essential (primary) hypertension: Secondary | ICD-10-CM

## 2017-04-04 DIAGNOSIS — Z803 Family history of malignant neoplasm of breast: Secondary | ICD-10-CM

## 2017-04-04 MED ORDER — VARENICLINE TARTRATE 0.5 MG X 11 & 1 MG X 42 PO MISC: ORAL | 0 refills | Status: DC

## 2017-04-04 MED ORDER — ONDANSETRON HCL 4 MG PO TABS: 4.0000 mg | ORAL_TABLET | Freq: Every day | ORAL | 1 refills | Status: AC | PRN

## 2017-04-04 MED ORDER — AMOXICILLIN-POT CLAVULANATE 875-125 MG PO TABS: 1.0000 | ORAL_TABLET | Freq: Two times a day (BID) | ORAL | 0 refills | Status: AC

## 2017-04-04 MED ORDER — TRAMADOL HCL 50 MG PO TABS: 50.0000 mg | ORAL_TABLET | Freq: Four times a day (QID) | ORAL | 0 refills | Status: DC | PRN

## 2017-04-04 NOTE — Assessment & Plan Note (Signed)
Patient requesting Chantix today to assist with smoking cessation. The patient wants to quit because she had a relative with lung caner. Her partner is supportive of this goal. She is ready to get started. Previous prescription from 01/2017 was not filled because the patient's pharmacy told her that it was prescribed incorrectly by the physician. A new prescription was sent today for the patient and she was briefly counseled about smoking cessation.

## 2017-04-04 NOTE — Patient Instructions (Signed)
Thank you for seeing [place] today!  Please start taking Augmentin. You will take 1 tablet twice a day (one at breakfast and one at dinner). If you have GI side effects, it may be helpful to take a probiotic or eat yogurt while on antibiotics.  Please start taking Tramadol as previously instructed for your pain. You were given a prescription for Zofran, an anti-nausea medication, to help with nausea while taking Tramadol.   You are now scheduled to have your mammogram and ultrasound on Monday 04/08/2017 at 9:30. Please arrive to this appointment by 9:15 am.   Please follow up with your PCP in 1-3 months for management of chronic medical conditions. Please let us know if your pain gets worse or if you start to develop fever. You can call the clinic if these symptoms develop.

## 2017-04-04 NOTE — Progress Notes (Signed)
CC: R Breast pain follow up  HPI:  Kathleen Stone is a 51 y.o. with a PMH of HTN and neuropathy who is presenting today with worsening R sided breast pain and nipple discharge. Per chart review the patient has a history of galactorrhea and R benign papilloma s/p excision in 2009 and was recently evaluated in the [facility] for acute onset R sided breast pain. The patient's last mammogram in 11/2015 was BIRADS 1. At her last clinic visit on 03/29/2017 the patient was referred for bilateral mammogram and axillary [place] to work up her breast pain.   Since her last visit the patient's pain in her right breast has continued. She has been unable to take her tramadol to help with the pain, because she took it once and it made her nauseous. She states that her pain has overall improved, but she still notices it mainly at night. Over the past two days the patient has noticed swelling and heat coming off the R breast. She also noticed that it has become more red in color during this time. She also noticed that her R breast started draining a brown substance. The patient thinks that the drainage has actually helped her pain and states that she feels some relief whenever it comes. Patient denies fevers, new lumps or bumps on her body, and recent unintentional weight loss.  Past Medical History:  Diagnosis Date  . Chronic lower back pain    Negative MRI of hips bilaterally, neg CT scan of A and P; MRI of T and L spine-> very mild deg changes from T4-5 and T7-8, cervical spondylotic changes at C5-6, very mild disc bulge at L2-3 without spinal stenosis, nl ESR, ANA, RF, HIV, TSH, and B12  . DJD (degenerative joint disease) of cervical spine   . Family history of adverse reaction to anesthesia    "sisters PONV"  . Galactorrhea of both breasts 2009   R. sided w/ benign papilloma excised in 4/09. L sided in 5/09  . Hypertension   . Migraine    "monthly" (11/15/2016)  . Neuropathy    Thought to be 2/2 nerve  compression in the C-spine  . Obesity   . Personal history of adenomatous colonic polyp    09/2007 - diminutive adenoma [name])  . PONV (postoperative nausea and vomiting)   . Tobacco abuse    Review of Systems:   Patient denies chest pain, SOB, abdominal pain, and recent change in bowel, bladder habits.  Physical Exam:  Vitals:   04/04/17 0911  BP: (!) 151/57  Pulse: 67  Temp: 98.1 F (36.7 C)  TempSrc: Oral  SpO2: 100%  Weight: 251 lb 3.2 oz (113.9 kg)  Height: 5' 6.5" (1.689 m)   Physical Exam  Constitutional:  Patient sitting on exam table in gown. Her gown is open in front because she says it is painful to have clothes touching her R breast. She is uncomfortable but in no acute distress.   Cardiovascular: Normal rate, regular rhythm, normal heart sounds and intact distal pulses.   No murmur heard. Pulmonary/Chest: Effort normal and breath sounds normal. No respiratory distress.  Abdominal: Soft. She exhibits no distension. There is no tenderness.  Patient has well healed, vertical incision scar on lower abdomen  Musculoskeletal: She exhibits no edema (of lower extremities bilaterally) or tenderness (of lower extremities bilaterally).  Skin:  Breast exam: Patient's R breast shows circular area of erythema that is about 10 cm in diameter and centered around the R  nipple. Some dried, green discharge is present on the aerola. Small red skin breakdown present on nipple. Patient is tender to the touch during exam and area of erythema is warm to the touch compared to L breast. No erythema or tenderness present on L breast. No masses palpated on L or R breast. No cervical or axillary lymphadenopathy appreciated bilaterally.   Assessment & Plan:   See Encounters Tab for problem based charting.  Patient seen with Dr. Lynnae January.

## 2017-04-04 NOTE — Assessment & Plan Note (Addendum)
The patient's right breast pain has improved somewhat, however she has recently developed swelling, erythema, and brown nipple discharge. Her PE was concerning for infection, given that she unilateral dried purulent discharge, erythema, calor, and tenderness. There were no discrete fluid collections or areas suggestive of abscess on PE, but this should not be ruled out. The patient's exam is somewhat concerning for inflammatory breast cancer, but her constellation of symptoms and the fact that the inflammation does not involve the entire breast are more suggestive of mastitis. Given this, the patient was provided with a 5 day course of amoxicillin-clavulanate (875-125 mg). She was counseled that she may have GI distress with this medication and she should try eating yogurt while taking antibiotic. She was also instructed to start taking Zofran to prevent nausea when she takes her pain medication.   The patient was instructed to keep previously scheduled appointments for mammogram and breast ultrasound. These appointments were, in fact, moved up to Monday 04/08/2017. Her mammogram is important as it can help determine if there is a reason that she developed mastitis, such as a recurrent intraductal papilloma or other mass concerning for malignancy. The patient will also be evaluated with ultrasound to make sure an abscess has not developed and/or if there is a mass that should be biopsied.  The patient was told to return to clinic if symptoms worsen and to follow up with PCP in a month. She was told to call the clinic if she doesn't hear about the results of the imaging studies early next week.

## 2017-04-08 ENCOUNTER — Other Ambulatory Visit: Payer: Self-pay | Admitting: Internal Medicine

## 2017-04-08 ENCOUNTER — Ambulatory Visit
Admission: RE | Admit: 2017-04-08 | Discharge: 2017-04-08 | Disposition: A | Discharge status: Home / Self Care | Payer: 59 | Source: Ambulatory Visit | Attending: Internal Medicine | Admitting: Internal Medicine

## 2017-04-08 DIAGNOSIS — N611 Abscess of the breast and nipple: Secondary | ICD-10-CM

## 2017-04-08 DIAGNOSIS — N644 Mastodynia: Secondary | ICD-10-CM

## 2017-04-08 NOTE — Progress Notes (Signed)
Internal Medicine Clinic Attending  I saw and evaluated the patient.  I personally confirmed the key portions of the history and exam documented by Dr. Nedrud and I reviewed pertinent patient test results.  The assessment, diagnosis, and plan were formulated together and I agree with the documentation in the resident's note.  

## 2017-04-10 ENCOUNTER — Other Ambulatory Visit: Payer: 59

## 2017-04-11 ENCOUNTER — Ambulatory Visit
Admission: RE | Admit: 2017-04-11 | Discharge: 2017-04-11 | Disposition: A | Discharge status: Home / Self Care | Payer: 59 | Source: Ambulatory Visit | Attending: Internal Medicine | Admitting: Internal Medicine

## 2017-04-11 ENCOUNTER — Other Ambulatory Visit: Payer: Self-pay | Admitting: Internal Medicine

## 2017-04-11 DIAGNOSIS — N611 Abscess of the breast and nipple: Secondary | ICD-10-CM

## 2017-04-13 LAB — AEROBIC/ANAEROBIC CULTURE W GRAM STAIN (SURGICAL/DEEP WOUND)

## 2017-04-13 LAB — AEROBIC/ANAEROBIC CULTURE (SURGICAL/DEEP WOUND): SPECIAL REQUESTS: NORMAL

## 2017-04-15 ENCOUNTER — Other Ambulatory Visit: Payer: 59

## 2017-04-16 ENCOUNTER — Other Ambulatory Visit: Payer: 59

## 2017-04-16 ENCOUNTER — Inpatient Hospital Stay: Admission: RE | Admit: 2017-04-16 | Payer: 59 | Source: Ambulatory Visit

## 2017-04-17 ENCOUNTER — Encounter: Payer: Self-pay | Admitting: Student

## 2017-04-22 ENCOUNTER — Ambulatory Visit
Admission: RE | Admit: 2017-04-22 | Discharge: 2017-04-22 | Disposition: A | Discharge status: Home / Self Care | Payer: 59 | Source: Ambulatory Visit | Attending: Internal Medicine | Admitting: Internal Medicine

## 2017-04-22 DIAGNOSIS — N611 Abscess of the breast and nipple: Secondary | ICD-10-CM

## 2017-04-25 ENCOUNTER — Other Ambulatory Visit: Payer: Self-pay | Admitting: Internal Medicine

## 2017-04-25 DIAGNOSIS — M5416 Radiculopathy, lumbar region: Secondary | ICD-10-CM

## 2017-05-03 ENCOUNTER — Telehealth: Payer: Self-pay | Admitting: *Deleted

## 2017-05-03 ENCOUNTER — Encounter: Payer: Self-pay | Admitting: Student

## 2017-05-03 NOTE — Telephone Encounter (Signed)
Kathleen Stone, has and order for Cardiac CT,we have tried to call her several time and mailed a letter.We still  have no response from her.I'm going to remove the order at this time,when  she call us to schedule,please place a new order.Thanks

## 2017-06-18 ENCOUNTER — Other Ambulatory Visit: Payer: Self-pay | Admitting: Internal Medicine

## 2017-06-18 DIAGNOSIS — N6001 Solitary cyst of right breast: Secondary | ICD-10-CM

## 2017-06-24 ENCOUNTER — Ambulatory Visit
Admission: RE | Admit: 2017-06-24 | Discharge: 2017-06-24 | Disposition: A | Discharge status: Home / Self Care | Payer: 59 | Source: Ambulatory Visit | Attending: Internal Medicine | Admitting: Internal Medicine

## 2017-06-24 ENCOUNTER — Other Ambulatory Visit: Payer: Self-pay | Admitting: Internal Medicine

## 2017-06-24 DIAGNOSIS — N6001 Solitary cyst of right breast: Secondary | ICD-10-CM

## 2017-07-15 ENCOUNTER — Other Ambulatory Visit: Payer: Self-pay | Admitting: Internal Medicine

## 2017-07-15 DIAGNOSIS — M5416 Radiculopathy, lumbar region: Secondary | ICD-10-CM

## 2017-07-19 ENCOUNTER — Other Ambulatory Visit: Payer: Self-pay | Admitting: Internal Medicine

## 2017-07-19 DIAGNOSIS — N644 Mastodynia: Secondary | ICD-10-CM

## 2017-07-22 ENCOUNTER — Other Ambulatory Visit: Payer: Self-pay | Admitting: Internal Medicine

## 2017-07-22 ENCOUNTER — Ambulatory Visit
Admission: RE | Admit: 2017-07-22 | Discharge: 2017-07-22 | Disposition: A | Discharge status: Home / Self Care | Payer: 59 | Source: Ambulatory Visit | Attending: Internal Medicine | Admitting: Internal Medicine

## 2017-07-22 DIAGNOSIS — N6001 Solitary cyst of right breast: Secondary | ICD-10-CM

## 2017-07-22 DIAGNOSIS — N644 Mastodynia: Secondary | ICD-10-CM

## 2017-07-24 ENCOUNTER — Other Ambulatory Visit: Payer: 59

## 2017-07-29 ENCOUNTER — Other Ambulatory Visit: Payer: 59

## 2017-07-29 LAB — AEROBIC/ANAEROBIC CULTURE W GRAM STAIN (SURGICAL/DEEP WOUND)

## 2017-07-29 LAB — AEROBIC/ANAEROBIC CULTURE (SURGICAL/DEEP WOUND)

## 2017-08-02 ENCOUNTER — Inpatient Hospital Stay: Admission: RE | Admit: 2017-08-02 | Payer: 59 | Source: Ambulatory Visit

## 2017-12-27 ENCOUNTER — Other Ambulatory Visit: Payer: Self-pay | Admitting: *Deleted

## 2017-12-27 DIAGNOSIS — M5416 Radiculopathy, lumbar region: Secondary | ICD-10-CM

## 2017-12-27 MED ORDER — GABAPENTIN 300 MG PO CAPS: 300.0000 mg | ORAL_CAPSULE | Freq: Two times a day (BID) | ORAL | 2 refills | Status: DC

## 2018-01-13 ENCOUNTER — Encounter: Payer: 59 | Admitting: Internal Medicine

## 2018-03-05 ENCOUNTER — Encounter: Payer: Self-pay | Admitting: *Deleted

## 2018-04-21 IMAGING — MR MR LUMBAR SPINE W/O CM
5 series · 44 of 48 positions shown · non-contrast
Comparison: 08/28/2011

CLINICAL DATA: Lumbar radiculopathy. Central low back pain
radiating into both legs for multiple years, severe for the past 7
day 8 months.

EXAM:
MRI LUMBAR SPINE WITHOUT CONTRAST
TECHNIQUE: Multiplanar, multisequence MR imaging of the lumbar spine was
performed. No intravenous contrast was administered.

[Series 3: T2 · sagittal · 4.0mm · 0.88mm/px · 6 of 13 slices shown (1 of 2)]
[im 1/13]
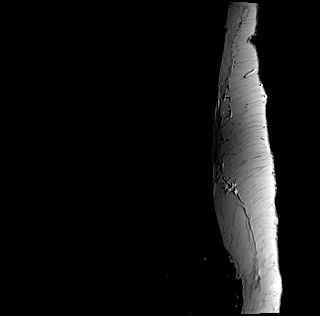
[im 3/13]
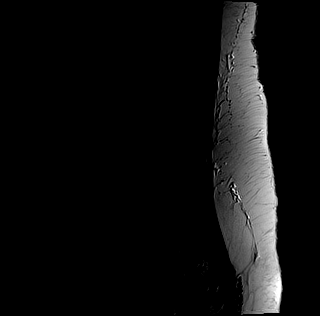
[im 5/13]
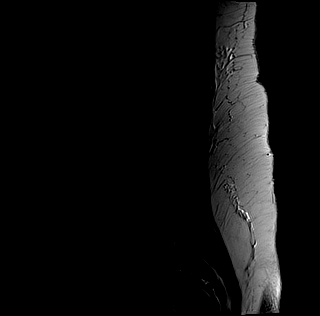
[im 8/13]
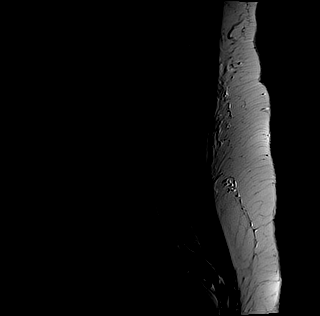
[im 10/13]
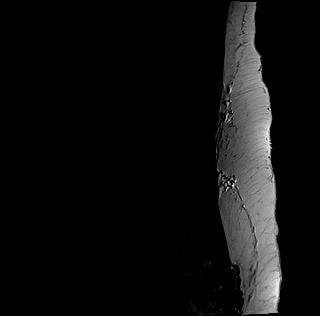
[im 13/13]
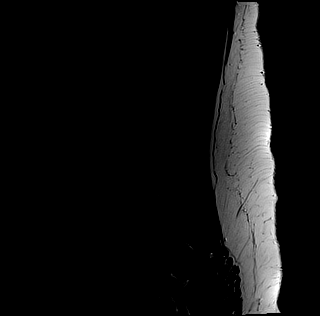

[Series 4: tirm sag · sagittal · 4.0mm · 0.55mm/px · 6 of 13 slices shown]
[im 1/13]
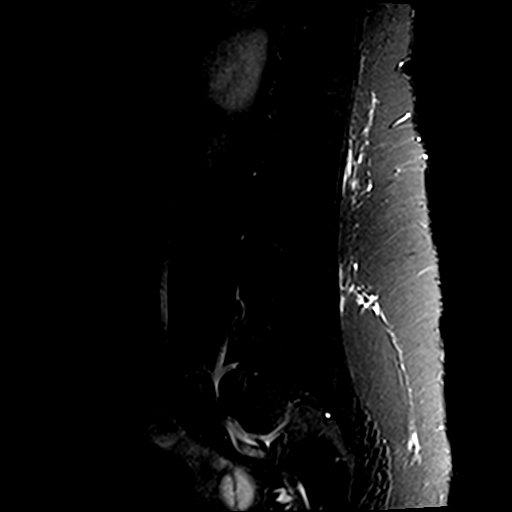
[im 3/13]
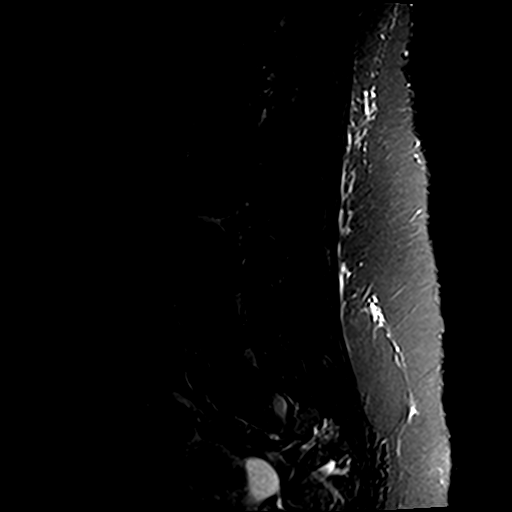
[im 5/13]
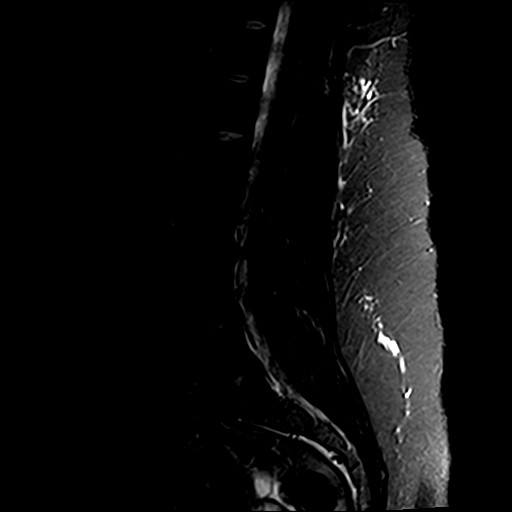
[im 8/13]
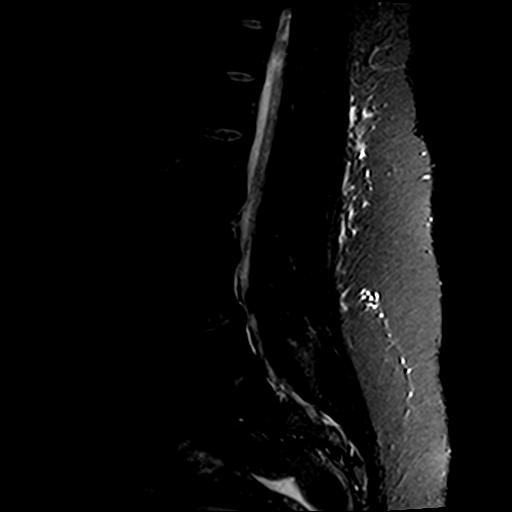
[im 10/13]
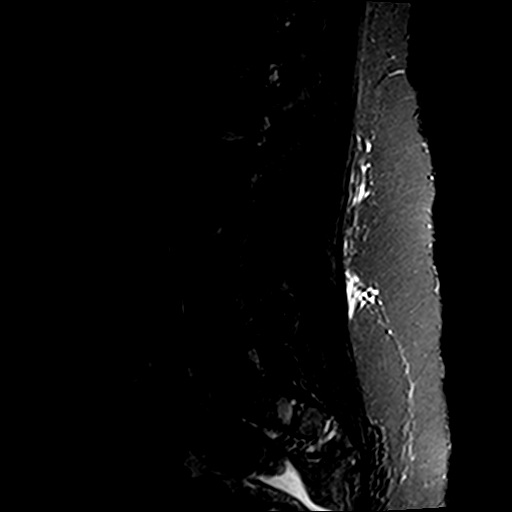
[im 13/13]
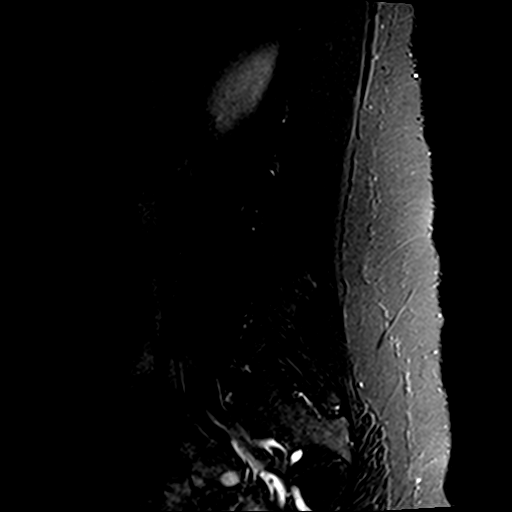

[Series 5: T1 · sagittal · 4.0mm · 0.88mm/px · 6 of 13 slices shown (1 of 2)]
[im 1/13]
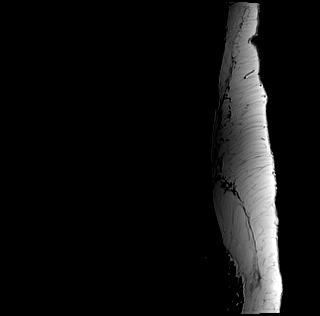
[im 3/13]
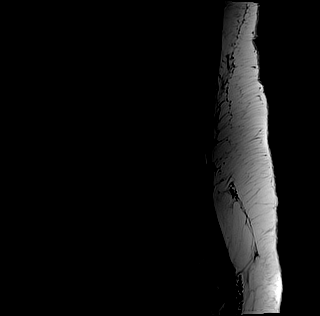
[im 5/13]
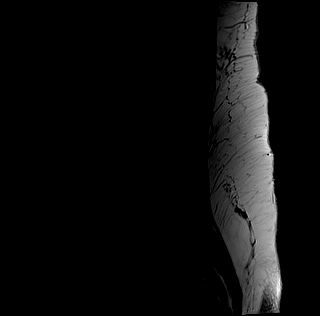
[im 8/13]
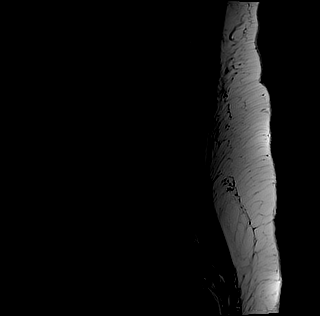
[im 10/13]
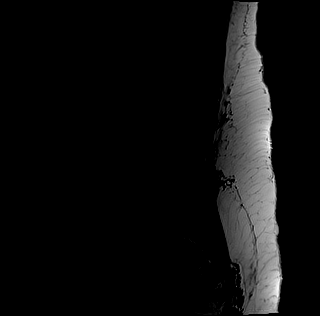
[im 13/13]
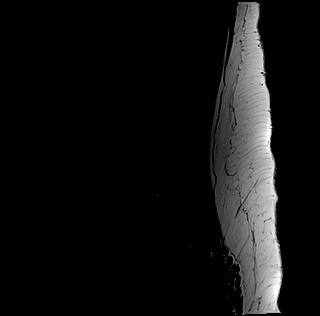

[Series 6: T1 · axial · 4.0mm · 0.70mm/px · z∈[-43,+141]mm · 11 of 33 slices shown (2 of 2)]
[im 1/33]
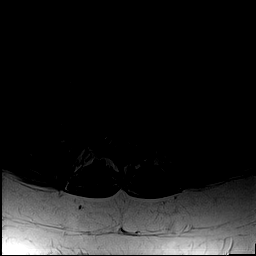
[im 3/33]
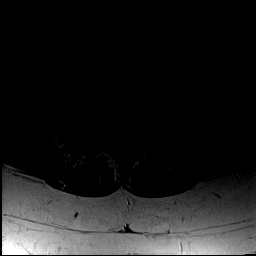
[im 5/33]
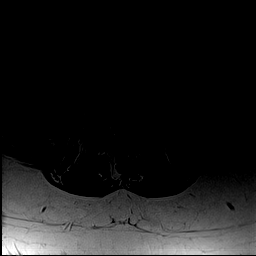
[im 7/33]
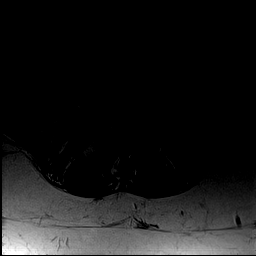
[im 10/33]
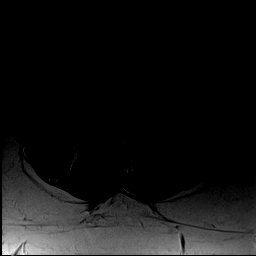
[im 14/33]
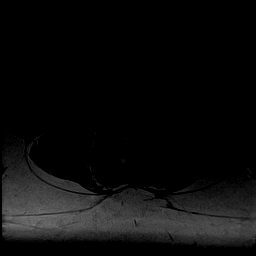
[im 17/33]
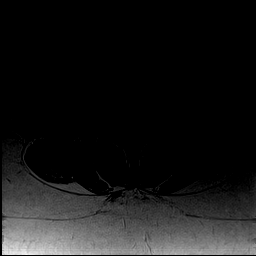
[im 19/33]
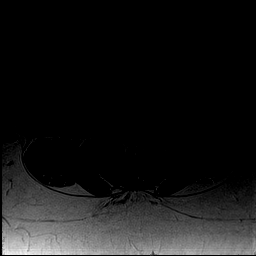
[im 23/33]
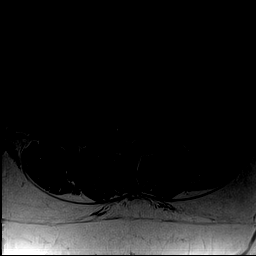
[im 28/33]
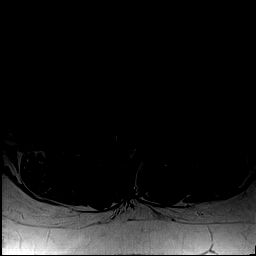
[im 33/33]
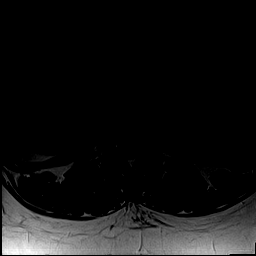

[Series 7: T2 · axial · 4.0mm · 0.70mm/px · z∈[-43,+141]mm · 15 of 33 slices shown (2 of 2)]
[im 1/33]
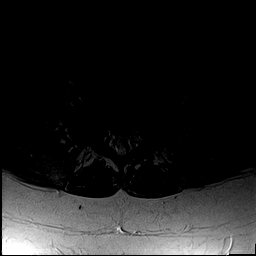
[im 3/33]
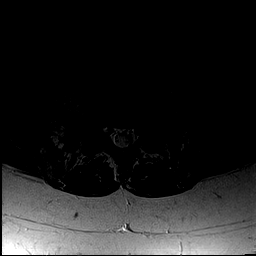
[im 5/33]
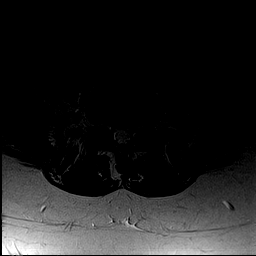
[im 7/33]
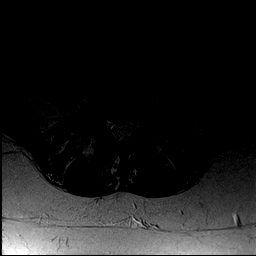
[im 10/33]
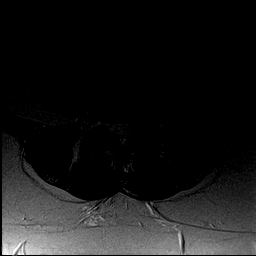
[im 12/33]
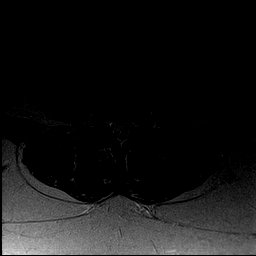
[im 14/33]
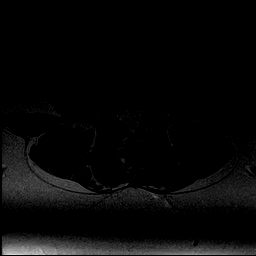
[im 17/33]
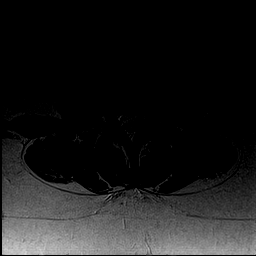
[im 19/33]
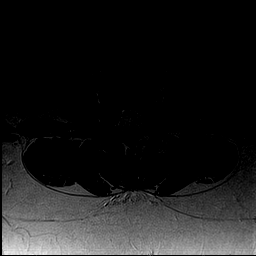
[im 21/33]
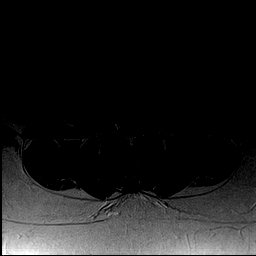
[im 23/33]
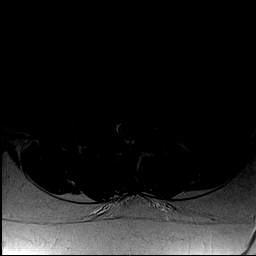
[im 26/33]
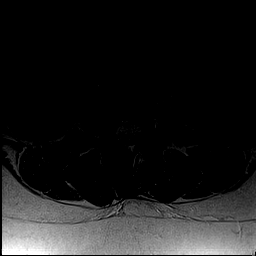
[im 28/33]
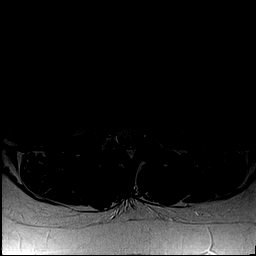
[im 30/33]
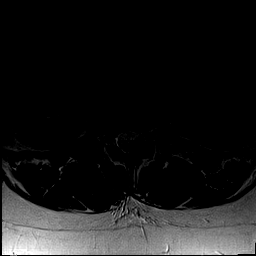
[im 33/33]
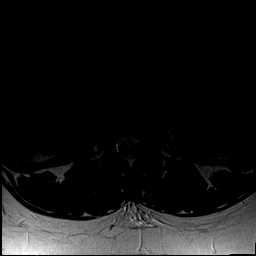

[44 of 48 positions shown; findings below may reference images not displayed]

FINDINGS: Vertebral alignment is unchanged, with trace retrolisthesis again
seen of L2 on L3. Vertebral body heights are preserved. Disc
desiccation is present at L2-3 and L3-4, new at the latter level. No
significant vertebral marrow edema is seen. The conus medullaris is
normal in signal and terminates at the inferior aspect of L1.
Paraspinal soft tissues are unremarkable.

L1-2:  Negative.

L2-3: Mild disc bulging, slightly progressed from prior. No
stenosis.

L3-4: New, mild disc bulging and mild facet hypertrophy result in
mild bilateral neural foraminal stenosis. No spinal stenosis.

L4-5: Mild disc bulging and moderate right and mild left facet
hypertrophy result in minimal to mild bilateral neural foraminal
stenosis, not significantly changed. No spinal stenosis.

L5-S1: Mild disc bulging and mild facet arthrosis result in minimal
right greater than left neural foraminal narrowing without spinal
stenosis, unchanged.
IMPRESSION: 1. New disc bulging at L3-4 with mild bilateral neural foraminal
stenosis.
2. Slightly progressive disc bulging at L2-3 without stenosis.
3. Unchanged disc bulging and facet arthrosis elsewhere as above.

## 2018-09-30 ENCOUNTER — Encounter: Payer: Self-pay | Admitting: Internal Medicine

## 2018-10-21 ENCOUNTER — Other Ambulatory Visit: Payer: Self-pay | Admitting: Internal Medicine

## 2018-10-21 NOTE — Addendum Note (Signed)
Addended by: [name] on: 10/21/2018 03:20 PM   Modules accepted: Orders

## 2018-12-22 ENCOUNTER — Ambulatory Visit: Payer: Self-pay | Admitting: Internal Medicine

## 2018-12-22 ENCOUNTER — Other Ambulatory Visit: Payer: Self-pay

## 2018-12-22 DIAGNOSIS — M5416 Radiculopathy, lumbar region: Secondary | ICD-10-CM

## 2018-12-22 DIAGNOSIS — F17200 Nicotine dependence, unspecified, uncomplicated: Secondary | ICD-10-CM

## 2018-12-22 DIAGNOSIS — I10 Essential (primary) hypertension: Secondary | ICD-10-CM

## 2018-12-22 NOTE — Progress Notes (Deleted)
Attempted to contact patient x3 times, went directly to busy signal was unable to leave a message.

## 2018-12-22 NOTE — Progress Notes (Signed)
Attempted to contact patient x3 times, went directly to busy signal was unable to leave a message.

## 2019-03-18 ENCOUNTER — Telehealth: Payer: Self-pay | Admitting: *Deleted

## 2019-03-18 NOTE — Telephone Encounter (Signed)
A message was left, re: follow up visit. 

## 2019-04-02 DIAGNOSIS — Z1159 Encounter for screening for other viral diseases: Secondary | ICD-10-CM | POA: Diagnosis not present

## 2019-04-06 NOTE — Progress Notes (Deleted)
 CC: ***  HPI:  [name] is a 53 y.o.  with a PMH listed below presenting for ***   Please see A&P for status of the patient's chronic medical conditions  Past Medical History:  Diagnosis Date  . Chronic lower back pain    Negative MRI of hips bilaterally, neg CT scan of A and P; MRI of T and L spine-> very mild deg changes from T4-5 and T7-8, cervical spondylotic changes at C5-6, very mild disc bulge at L2-3 without spinal stenosis, nl ESR, ANA, RF, HIV, TSH, and B12  . DJD (degenerative joint disease) of cervical spine   . Family history of adverse reaction to anesthesia    "sisters PONV"  . Galactorrhea of both breasts 2009   R. sided w/ benign papilloma excised in 4/09. L sided in 5/09  . Hypertension   . Migraine    "monthly" (11/15/2016)  . Neuropathy    Thought to be 2/2 nerve compression in the C-spine  . Obesity   . Personal history of adenomatous colonic polyp    09/2007 - diminutive adenoma ([facility])  . PONV (postoperative nausea and vomiting)   . Tobacco abuse    Review of Systems: Refer to history of present illness and assessment and plans for pertinent review of systems, all others reviewed and negative.  Physical Exam:  There were no vitals filed for this visit. *** Physical Exam  Social History   Socioeconomic History  . Marital status: Single    Spouse name: Not on file  . Number of children: Not on file  . Years of education: Not on file  . Highest education level: Not on file  Occupational History  . Not on file  Social Needs  . Financial resource strain: Not on file  . Food insecurity    Worry: Not on file    Inability: Not on file  . Transportation needs    Medical: Not on file    Non-medical: Not on file  Tobacco Use  . Smoking status: Current Every Day Smoker    Packs/day: 1.00    Years: 31.00    Pack years: 31.00    Types: Cigarettes  . Smokeless tobacco: Never Used  Substance and Sexual Activity  . Alcohol use: No   Alcohol/week: 0.0 standard drinks  . Drug use: No  . Sexual activity: Yes  Lifestyle  . Physical activity    Days per week: Not on file    Minutes per session: Not on file  . Stress: Not on file  Relationships  . Social connections    Talks on phone: Not on file    Gets together: Not on file    Attends religious service: Not on file    Active member of club or organization: Not on file    Attends meetings of clubs or organizations: Not on file    Relationship status: Not on file  . Intimate partner violence    Fear of current or ex partner: Not on file    Emotionally abused: Not on file    Physically abused: Not on file    Forced sexual activity: Not on file  Other Topics Concern  . Not on file  Social History Narrative   Domestic Partner: female has emphysema   Smoked 2 packs/day for 20+ years, down to 1/2ppd now, willing to try to quit    Alcohol use-no   Drug use-no   Regular exercise-no   *** Family History  Problem   Relation Age of Onset  . Heart failure Mother   . Diabetes Mother   . Breast cancer Unknown        aunt  . Liver cancer Unknown        aunt  . Lung cancer Father     Assessment & Plan:   See Encounters Tab for problem based charting.  Patient {GC/GE:3044014::"discussed with","seen with"} Dr. {NAMES:3044014::"Butcher","Granfortuna","E. Hoffman","Klima","Mullen","Narendra","Raines","Vincent"}

## 2019-04-08 ENCOUNTER — Encounter: Payer: Self-pay | Admitting: Internal Medicine

## 2019-05-09 DIAGNOSIS — Z0389 Encounter for observation for other suspected diseases and conditions ruled out: Secondary | ICD-10-CM | POA: Diagnosis not present

## 2019-05-09 DIAGNOSIS — E876 Hypokalemia: Secondary | ICD-10-CM | POA: Diagnosis not present

## 2019-05-09 DIAGNOSIS — I10 Essential (primary) hypertension: Secondary | ICD-10-CM | POA: Diagnosis not present

## 2019-05-09 DIAGNOSIS — R51 Headache: Secondary | ICD-10-CM | POA: Diagnosis not present

## 2019-05-09 DIAGNOSIS — R1084 Generalized abdominal pain: Secondary | ICD-10-CM | POA: Diagnosis not present

## 2019-05-09 DIAGNOSIS — R079 Chest pain, unspecified: Secondary | ICD-10-CM | POA: Diagnosis not present

## 2019-05-09 DIAGNOSIS — R9431 Abnormal electrocardiogram [ECG] [EKG]: Secondary | ICD-10-CM | POA: Diagnosis not present

## 2019-05-21 ENCOUNTER — Ambulatory Visit (HOSPITAL_COMMUNITY)
Admission: RE | Admit: 2019-05-21 | Discharge: 2019-05-21 | Disposition: A | Discharge status: Home / Self Care | Payer: BC Managed Care – PPO | Source: Ambulatory Visit | Attending: Family Medicine | Admitting: Family Medicine

## 2019-05-21 ENCOUNTER — Other Ambulatory Visit: Payer: Self-pay

## 2019-05-21 ENCOUNTER — Encounter: Payer: Self-pay | Admitting: Internal Medicine

## 2019-05-21 ENCOUNTER — Ambulatory Visit (INDEPENDENT_AMBULATORY_CARE_PROVIDER_SITE_OTHER): Payer: BC Managed Care – PPO | Admitting: Internal Medicine

## 2019-05-21 VITALS — BP 162/73 | HR 75 | Temp 98.3°F | Ht 66.0 in | Wt 268.0 lb

## 2019-05-21 DIAGNOSIS — F17210 Nicotine dependence, cigarettes, uncomplicated: Secondary | ICD-10-CM

## 2019-05-21 DIAGNOSIS — R0789 Other chest pain: Secondary | ICD-10-CM | POA: Diagnosis not present

## 2019-05-21 DIAGNOSIS — R9431 Abnormal electrocardiogram [ECG] [EKG]: Secondary | ICD-10-CM | POA: Diagnosis not present

## 2019-05-21 DIAGNOSIS — I119 Hypertensive heart disease without heart failure: Secondary | ICD-10-CM | POA: Diagnosis not present

## 2019-05-21 DIAGNOSIS — E785 Hyperlipidemia, unspecified: Secondary | ICD-10-CM | POA: Insufficient documentation

## 2019-05-21 DIAGNOSIS — S46912A Strain of unspecified muscle, fascia and tendon at shoulder and upper arm level, left arm, initial encounter: Secondary | ICD-10-CM | POA: Diagnosis not present

## 2019-05-21 DIAGNOSIS — X58XXXA Exposure to other specified factors, initial encounter: Secondary | ICD-10-CM | POA: Diagnosis not present

## 2019-05-21 DIAGNOSIS — Z79899 Other long term (current) drug therapy: Secondary | ICD-10-CM

## 2019-05-21 DIAGNOSIS — I10 Essential (primary) hypertension: Secondary | ICD-10-CM

## 2019-05-21 MED ORDER — LOSARTAN POTASSIUM 25 MG PO TABS: 25.0000 mg | ORAL_TABLET | Freq: Every day | ORAL | 0 refills | Status: DC

## 2019-05-21 MED ORDER — ATORVASTATIN CALCIUM 40 MG PO TABS: 40.0000 mg | ORAL_TABLET | Freq: Every day | ORAL | 3 refills | Status: DC

## 2019-05-21 NOTE — Assessment & Plan Note (Addendum)
Pain in left chest and tingling in left hand and toes.  Has been off gabapentin for two years.  Chest discomfort and hand tingling when going down the road.  Still has a soreness in chest and tingling in left hand.  Just started today while she was at work.  Non exertional, does not radiate to neck.  ?diaphoresis difficult to be sure based on her descriptions.  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  Pain lasts for 15 minutes and then goes away although does have some soreness here in the office.   Seen for a physical a few weeks ago and was sent to [facility] for concerning ecg on 8/23.  Chart review care everywhere shows ecg read of LVH with repolarization abnormality.  Her ACS workup was negative and she was sent home.  No chest pain currently just some left hand tingling.  She is currently smoking 1ppd.  Seen by cardiology for si Pain reproducible with palpation and when pt performing chest pressmilar chest pain workup with nuclear test was inconclusive and they opted for CT angiography to follow this but pt never completed this.  She is also seeing Dr. Percell Miller at [facility] center.  Says she was prescribed a new bp medicine on Saturday. Marland Kitchen POCUS without fluid collection or abscess and no lymphadenopathy on exam.  She has some trace edema in the extremities but no JVP elevation.  ECG with LVH, biatrial enlargement and repolarization changes associated with LVH which are unchanged from prior studies.    -needs aggressive risk factor modification and to have single PCP she says she will transfer care here to Dr. Sherry Ruffing and will notify Dr. Percell Miller -restarted her atorvastatin today, when she finds out what bp medication was started she will let me know and I will start additional bp med -she will work on weight loss and cutting back on smoking which we will discuss further at next visit -referred pt back to cardiology for her to complete her ischemic workup with Coronary CT  angio -conservative care for atypical left pectoralis muscle pain  Addendum: pt called back and reported she was started on metoprolol by Dr. Percell Miller and therefore I added Losartan to regimen.

## 2019-05-21 NOTE — Addendum Note (Signed)
Addended by: Guadlupe Spanish B on: 05/21/2019 05:09 PM   Modules accepted: Orders

## 2019-05-21 NOTE — Progress Notes (Signed)
CC: atypical chest pain, HLD  HPI:  [name] is a 53 y.o. female with PMH below.  Today we will address atypical chest pain, HLD   Please see A&P for status of the patient's chronic medical conditions  Past Medical History:  Diagnosis Date  . Chronic lower back pain    Negative MRI of hips bilaterally, neg CT scan of A and P; MRI of T and L spine-> very mild deg changes from T4-5 and T7-8, cervical spondylotic changes at C5-6, very mild disc bulge at L2-3 without spinal stenosis, nl ESR, ANA, RF, HIV, TSH, and B12  . DJD (degenerative joint disease) of cervical spine   . Family history of adverse reaction to anesthesia    "sisters PONV"  . Galactorrhea of both breasts 2009   R. sided w/ benign papilloma excised in 4/09. L sided in 5/09  . Hypertension   . Migraine    "monthly" (11/15/2016)  . Neuropathy    Thought to be 2/2 nerve compression in the C-spine  . Obesity   . Personal history of adenomatous colonic polyp    09/2007 - diminutive adenoma [name])  . PONV (postoperative nausea and vomiting)   . Tobacco abuse    Review of Systems:  ROS: Pulmonary: pt denies increased work of breathing, shortness of breath,  Cardiac: pt denies palpitations, chest pain,  Abdominal: pt denies abdominal pain, nausea, vomiting, or diarrhea   Physical Exam:  Vitals:   05/21/19 1452  BP: (!) 162/73  Pulse: 75  Temp: 98.3 F (36.8 C)  TempSrc: Oral  Weight: 268 lb (121.6 kg)  Height: '5\' 6"'  (1.676 m)   Cardiac: JVD flat, normal rate and rhythm, clear s1 and s2, no murmurs, rubs or gallops, trace LE edema bilaterally Pulmonary: CTAB, not in distress Abdominal: non distended abdomen, soft and nontender Left chest:  No axillary, clavicualr or cervical lymphadenopathy,  Pain reproducible with palpation and when pt performing chest press  POCUS: fibrous tissue,  No fluid collection or abscess   Social History   Socioeconomic History  . Marital status: Single    Spouse  name: Not on file  . Number of children: Not on file  . Years of education: Not on file  . Highest education level: Not on file  Occupational History  . Not on file  Social Needs  . Financial resource strain: Not on file  . Food insecurity    Worry: Not on file    Inability: Not on file  . Transportation needs    Medical: Not on file    Non-medical: Not on file  Tobacco Use  . Smoking status: Current Every Day Smoker    Packs/day: 1.00    Years: 31.00    Pack years: 31.00    Types: Cigarettes  . Smokeless tobacco: Never Used  Substance and Sexual Activity  . Alcohol use: No    Alcohol/week: 0.0 standard drinks  . Drug use: No  . Sexual activity: Yes  Lifestyle  . Physical activity    Days per week: Not on file    Minutes per session: Not on file  . Stress: Not on file  Relationships  . Social Herbalist on phone: Not on file    Gets together: Not on file    Attends religious service: Not on file    Active member of club or organization: Not on file    Attends meetings of clubs or organizations: Not on file  Relationship status: Not on file  . Intimate partner violence    Fear of current or ex partner: Not on file    Emotionally abused: Not on file    Physically abused: Not on file    Forced sexual activity: Not on file  Other Topics Concern  . Not on file  Social History Narrative   Domestic Partner: female has emphysema   Smoked 2 packs/day for 20+ years, down to 1/2ppd now, willing to try to quit    Alcohol use-no   Drug use-no   Regular exercise-no    Family History  Problem Relation Age of Onset  . Heart failure Mother   . Diabetes Mother   . Breast cancer Unknown        aunt  . Liver cancer Unknown        aunt  . Lung cancer Father     Assessment & Plan:   See Encounters Tab for problem based charting.  Patient discussed with Dr. Angelia Mould

## 2019-05-21 NOTE — Assessment & Plan Note (Addendum)
Confirmed with patient that she was started on metoprolol 25mg . BP still markedly elevated.   Will need further bp meds, recent labs in care everywhere show normal renal function.  I will add losartan 25mg  daily which has some data for HFpEF.

## 2019-05-21 NOTE — Patient Instructions (Addendum)
Kathleen Stone you have strained the muscle of your left arm.  I do not feel the discomfort you are having is coming from your heart.  Your ecg does not show signs of a heart attack but your heart is enlarged and it will be important to get your blood pressure under good control.   I have restarted your atorvastatin.  You will need to follow up with cardiology to get your coronary ct angiogram completed, I have placed a referral. You can use over the counter medications for your muscle strain I would prefer you use aleve and tylenol and also therapies described below.    Muscle Strain A muscle strain is an injury that happens when a muscle is stretched longer than normal. This can happen during a fall, sports, or lifting. This can tear some muscle fibers. Usually, recovery from muscle strain takes 1-2 weeks. Complete healing normally takes 5-6 weeks. This condition is first treated with PRICE therapy. This involves:  Protecting your muscle from being injured again.  Resting your injured muscle.  Icing your injured muscle.  Applying pressure (compression) to your injured muscle. This may be done with a splint or elastic bandage.  Raising (elevating) your injured muscle. Your doctor may also recommend medicine for pain. Follow these instructions at home: If you have a splint:  Wear the splint as told by your doctor. Take it off only as told by your doctor.  Loosen the splint if your fingers or toes tingle, get numb, or turn cold and blue.  Keep the splint clean.  If the splint is not waterproof: ? Do not let it get wet. ? Cover it with a watertight covering when you take a bath or a shower. Managing pain, stiffness, and swelling   If directed, put ice on your injured area. ? If you have a removable splint, take it off as told by your doctor. ? Put ice in a plastic bag. ? Place a towel between your skin and the bag. ? Leave the ice on for 20 minutes, 2-3 times a day.  Move your fingers or  toes often. This helps to avoid stiffness and lessen swelling.  Raise your injured area above the level of your heart while you are sitting or lying down.  Wear an elastic bandage as told by your doctor. Make sure it is not too tight. General instructions  Take over-the-counter and prescription medicines only as told by your doctor.  Limit your activity. Rest your injured muscle as told by your doctor. Your doctor may say that gentle movements are okay.  If physical therapy was prescribed, do exercises as told by your doctor.  Do not put pressure on any part of the splint until it is fully hardened. This may take many hours.  Do not use any products that contain nicotine or tobacco, such as cigarettes and e-cigarettes. These can delay bone healing. If you need help quitting, ask your doctor.  Warm up before you exercise. This helps to prevent more muscle strains.  Ask your doctor when it is safe to drive if you have a splint.  Keep all follow-up visits as told by your doctor. This is important. Contact a doctor if:  You have more pain or swelling in your injured area. Get help right away if:  You have any of these problems in your injured area: ? You have numbness. ? You have tingling. ? You lose a lot of strength. Summary  A muscle strain is an injury  that happens when a muscle is stretched longer than normal.  This condition is first treated with PRICE therapy. This includes protecting, resting, icing, adding pressure, and raising your injury.  Limit your activity. Rest your injured muscle as told by your doctor. Your doctor may say that gentle movements are okay.  Warm up before you exercise. This helps to prevent more muscle strains. This information is not intended to replace advice given to you by your health care provider. Make sure you discuss any questions you have with your health care provider. Document Released: 06/12/2008 Document Revised: 10/30/2018 Document  Reviewed: 10/10/2016 Elsevier Patient Education  2020 [employer].

## 2019-05-21 NOTE — Assessment & Plan Note (Signed)
Lab Results  Component Value Date   CHOL 175 11/16/2016   HDL 35 (L) 11/16/2016   LDLCALC 107 (H) 11/16/2016   TRIG 166 (H) 11/16/2016   CHOLHDL 5.0 11/16/2016   Last LDL elevated above goal.  ASCVD risk 26.9% off her statin.    -restart atorvastatin 40

## 2019-05-26 NOTE — Progress Notes (Signed)
Internal Medicine Clinic Attending  Case discussed with Dr. Winfrey  at the time of the visit.  We reviewed the resident's history and exam and pertinent patient test results.  I agree with the assessment, diagnosis, and plan of care documented in the resident's note.  

## 2019-06-01 ENCOUNTER — Encounter: Payer: Self-pay | Admitting: Internal Medicine

## 2019-06-04 ENCOUNTER — Ambulatory Visit (INDEPENDENT_AMBULATORY_CARE_PROVIDER_SITE_OTHER): Payer: BC Managed Care – PPO | Admitting: Internal Medicine

## 2019-06-04 ENCOUNTER — Other Ambulatory Visit: Payer: Self-pay

## 2019-06-04 ENCOUNTER — Encounter: Payer: Self-pay | Admitting: Internal Medicine

## 2019-06-04 VITALS — BP 144/77 | HR 67 | Temp 98.1°F | Ht 66.0 in | Wt 267.1 lb

## 2019-06-04 DIAGNOSIS — I10 Essential (primary) hypertension: Secondary | ICD-10-CM

## 2019-06-04 DIAGNOSIS — F17210 Nicotine dependence, cigarettes, uncomplicated: Secondary | ICD-10-CM | POA: Diagnosis not present

## 2019-06-04 DIAGNOSIS — Z79899 Other long term (current) drug therapy: Secondary | ICD-10-CM | POA: Diagnosis not present

## 2019-06-04 MED ORDER — METOPROLOL TARTRATE 25 MG PO TABS: 25.0000 mg | ORAL_TABLET | Freq: Two times a day (BID) | ORAL | 0 refills | Status: DC

## 2019-06-04 NOTE — Progress Notes (Signed)
Internal Medicine Clinic Attending  Case discussed with Dr. Winfrey  at the time of the visit.  We reviewed the resident's history and exam and pertinent patient test results.  I agree with the assessment, diagnosis, and plan of care documented in the resident's note.  

## 2019-06-04 NOTE — Assessment & Plan Note (Addendum)
last visit we started Kathleen Stone on Losartan 25mg .  She is also taking metoprolol tartrate 25mg  BID.  -refill metoprolol  -she will pick up her losartan and follow up in two weeks for repat bp and lab work

## 2019-06-04 NOTE — Patient Instructions (Addendum)
Kathleen Stone you will need to pick up your losartan today and come back in about two weeks so we can recheck your blood pressure and lab work.

## 2019-06-04 NOTE — Progress Notes (Signed)
CC: HTN  HPI:  [name] is a 53 y.o. female with PMH below.  Today we will address HTN  Please see A&P for status of the patient's chronic medical conditions  Past Medical History:  Diagnosis Date  . Chronic lower back pain    Negative MRI of hips bilaterally, neg CT scan of A and P; MRI of T and L spine-> very mild deg changes from T4-5 and T7-8, cervical spondylotic changes at C5-6, very mild disc bulge at L2-3 without spinal stenosis, nl ESR, ANA, RF, HIV, TSH, and B12  . DJD (degenerative joint disease) of cervical spine   . Family history of adverse reaction to anesthesia    "sisters PONV"  . Galactorrhea of both breasts 2009   R. sided w/ benign papilloma excised in 4/09. L sided in 5/09  . Hypertension   . Migraine    "monthly" (11/15/2016)  . Neuropathy    Thought to be 2/2 nerve compression in the C-spine  . Obesity   . Personal history of adenomatous colonic polyp    09/2007 - diminutive adenoma [name])  . PONV (postoperative nausea and vomiting)   . Tobacco abuse    Review of Systems:  ROS: Pulmonary: pt denies increased work of breathing, shortness of breath,  Cardiac: pt denies palpitations, chest pain,  Abdominal: pt denies abdominal pain, nausea, vomiting, or diarrhea  Physical Exam:  Vitals:   06/04/19 0846  BP: (!) 144/77  Pulse: 67  Temp: 98.1 F (36.7 C)  SpO2: 100%  Weight: 267 lb 1.6 oz (121.2 kg)  Height: '5\' 6"'  (1.676 m)   Cardiac: JVD flat, normal rate and rhythm, clear s1 and s2, no murmurs, rubs or gallops Pulmonary: CTAB, not in distress Abdominal: non distended abdomen, soft and nontender Psych: Alert, conversant, in good spirits   Social History   Socioeconomic History  . Marital status: Single    Spouse name: Not on file  . Number of children: Not on file  . Years of education: Not on file  . Highest education level: Not on file  Occupational History  . Not on file  Social Needs  . Financial resource strain: Not  on file  . Food insecurity    Worry: Not on file    Inability: Not on file  . Transportation needs    Medical: Not on file    Non-medical: Not on file  Tobacco Use  . Smoking status: Current Every Day Smoker    Packs/day: 1.00    Years: 31.00    Pack years: 31.00    Types: Cigarettes  . Smokeless tobacco: Never Used  Substance and Sexual Activity  . Alcohol use: No    Alcohol/week: 0.0 standard drinks  . Drug use: No  . Sexual activity: Yes  Lifestyle  . Physical activity    Days per week: Not on file    Minutes per session: Not on file  . Stress: Not on file  Relationships  . Social Herbalist on phone: Not on file    Gets together: Not on file    Attends religious service: Not on file    Active member of club or organization: Not on file    Attends meetings of clubs or organizations: Not on file    Relationship status: Not on file  . Intimate partner violence    Fear of current or ex partner: Not on file    Emotionally abused: Not on file  Physically abused: Not on file    Forced sexual activity: Not on file  Other Topics Concern  . Not on file  Social History Narrative   Domestic Partner: female has emphysema   Smoked 2 packs/day for 20+ years, down to 1/2ppd now, willing to try to quit    Alcohol use-no   Drug use-no   Regular exercise-no    Family History  Problem Relation Age of Onset  . Heart failure Mother   . Diabetes Mother   . Breast cancer Unknown        aunt  . Liver cancer Unknown        aunt  . Lung cancer Father     Assessment & Plan:   See Encounters Tab for problem based charting.  Patient discussed with Dr. Philipp Ovens

## 2019-06-17 ENCOUNTER — Ambulatory Visit: Payer: BC Managed Care – PPO

## 2019-06-22 ENCOUNTER — Other Ambulatory Visit: Payer: Self-pay | Admitting: Internal Medicine

## 2019-06-22 DIAGNOSIS — I10 Essential (primary) hypertension: Secondary | ICD-10-CM

## 2019-06-22 NOTE — Telephone Encounter (Signed)
Pt calling to f/u on her Refill request  metoprolol tartrate (LOPRESSOR) 25 MG tablet  [id number] [place], [place] - [address]

## 2019-06-30 ENCOUNTER — Other Ambulatory Visit: Payer: Self-pay | Admitting: Internal Medicine

## 2019-06-30 DIAGNOSIS — I10 Essential (primary) hypertension: Secondary | ICD-10-CM

## 2019-07-01 NOTE — Addendum Note (Signed)
Addended by: Hulan Fray on: 07/01/2019 05:09 PM   Modules accepted: Orders

## 2019-07-30 ENCOUNTER — Other Ambulatory Visit: Payer: Self-pay | Admitting: Internal Medicine

## 2019-07-30 DIAGNOSIS — I10 Essential (primary) hypertension: Secondary | ICD-10-CM

## 2019-08-17 NOTE — Progress Notes (Deleted)
 CC: ***  HPI:  Ms.Danaisha L Brue is a 53 y.o.  with a PMH listed below presenting for ***   Please see A&P for status of the patient's chronic medical conditions  Past Medical History:  Diagnosis Date  . Chronic lower back pain    Negative MRI of hips bilaterally, neg CT scan of A and P; MRI of T and L spine-> very mild deg changes from T4-5 and T7-8, cervical spondylotic changes at C5-6, very mild disc bulge at L2-3 without spinal stenosis, nl ESR, ANA, RF, HIV, TSH, and B12  . DJD (degenerative joint disease) of cervical spine   . Family history of adverse reaction to anesthesia    "sisters PONV"  . Galactorrhea of both breasts 2009   R. sided w/ benign papilloma excised in 4/09. L sided in 5/09  . Hypertension   . Migraine    "monthly" (11/15/2016)  . Neuropathy    Thought to be 2/2 nerve compression in the C-spine  . Obesity   . Personal history of adenomatous colonic polyp    09/2007 - diminutive adenoma ([facility])  . PONV (postoperative nausea and vomiting)   . Tobacco abuse    Review of Systems: Refer to history of present illness and assessment and plans for pertinent review of systems, all others reviewed and negative.  Physical Exam:  There were no vitals filed for this visit. *** Physical Exam  Social History   Socioeconomic History  . Marital status: Single    Spouse name: Not on file  . Number of children: Not on file  . Years of education: Not on file  . Highest education level: Not on file  Occupational History  . Not on file  Social Needs  . Financial resource strain: Not on file  . Food insecurity    Worry: Not on file    Inability: Not on file  . Transportation needs    Medical: Not on file    Non-medical: Not on file  Tobacco Use  . Smoking status: Current Every Day Smoker    Packs/day: 1.00    Years: 31.00    Pack years: 31.00    Types: Cigarettes  . Smokeless tobacco: Never Used  Substance and Sexual Activity  . Alcohol use: No   Alcohol/week: 0.0 standard drinks  . Drug use: No  . Sexual activity: Yes  Lifestyle  . Physical activity    Days per week: Not on file    Minutes per session: Not on file  . Stress: Not on file  Relationships  . Social connections    Talks on phone: Not on file    Gets together: Not on file    Attends religious service: Not on file    Active member of club or organization: Not on file    Attends meetings of clubs or organizations: Not on file    Relationship status: Not on file  . Intimate partner violence    Fear of current or ex partner: Not on file    Emotionally abused: Not on file    Physically abused: Not on file    Forced sexual activity: Not on file  Other Topics Concern  . Not on file  Social History Narrative   Domestic Partner: female has emphysema   Smoked 2 packs/day for 20+ years, down to 1/2ppd now, willing to try to quit    Alcohol use-no   Drug use-no   Regular exercise-no   *** Family History  Problem   Relation Age of Onset  . Heart failure Mother   . Diabetes Mother   . Breast cancer Unknown        aunt  . Liver cancer Unknown        aunt  . Lung cancer Father     Assessment & Plan:   See Encounters Tab for problem based charting.  Patient {GC/GE:[id number]::"discussed with","seen with"} Dr. {NAMES:[id number]::"Butcher","Granfortuna","E. Hoffman","Klima","Mullen","Narendra","Raines","Vincent"}  

## 2019-08-18 ENCOUNTER — Encounter: Payer: Self-pay | Admitting: Internal Medicine

## 2019-08-18 ENCOUNTER — Encounter: Payer: BC Managed Care – PPO | Admitting: Internal Medicine

## 2019-09-08 ENCOUNTER — Other Ambulatory Visit: Payer: Self-pay | Admitting: Internal Medicine

## 2019-09-08 DIAGNOSIS — I10 Essential (primary) hypertension: Secondary | ICD-10-CM

## 2019-09-08 NOTE — Telephone Encounter (Signed)
Patient needs follow up for BP

## 2019-09-09 ENCOUNTER — Telehealth: Payer: Self-pay | Admitting: *Deleted

## 2019-09-09 NOTE — Telephone Encounter (Signed)
Agree with plan 

## 2019-09-09 NOTE — Telephone Encounter (Signed)
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 Stated he has been tested for covid on Monday which she did not find out until last night. She lives with her wife. Instructed to stay quarantine at home b/c she may had been exposed; distance even with her wife, wear mask,good handwashing until the other driver get his results. Verbalized understanding.

## 2019-09-09 NOTE — Telephone Encounter (Signed)
Spoke with your patient.  Appt sch for 09/21/2019 .

## 2019-09-21 ENCOUNTER — Other Ambulatory Visit: Payer: Self-pay

## 2019-09-21 ENCOUNTER — Encounter: Payer: Self-pay | Admitting: Internal Medicine

## 2019-09-21 ENCOUNTER — Ambulatory Visit: Payer: BC Managed Care – PPO | Admitting: Internal Medicine

## 2019-09-21 VITALS — BP 156/58 | HR 81 | Temp 98.1°F | Ht 66.0 in | Wt 259.7 lb

## 2019-09-21 DIAGNOSIS — M5416 Radiculopathy, lumbar region: Secondary | ICD-10-CM

## 2019-09-21 DIAGNOSIS — I10 Essential (primary) hypertension: Secondary | ICD-10-CM

## 2019-09-21 DIAGNOSIS — F17200 Nicotine dependence, unspecified, uncomplicated: Secondary | ICD-10-CM

## 2019-09-21 DIAGNOSIS — E785 Hyperlipidemia, unspecified: Secondary | ICD-10-CM | POA: Diagnosis not present

## 2019-09-21 DIAGNOSIS — M62838 Other muscle spasm: Secondary | ICD-10-CM

## 2019-09-21 DIAGNOSIS — Z23 Encounter for immunization: Secondary | ICD-10-CM

## 2019-09-21 DIAGNOSIS — Z9114 Patient's other noncompliance with medication regimen: Secondary | ICD-10-CM

## 2019-09-21 DIAGNOSIS — F17210 Nicotine dependence, cigarettes, uncomplicated: Secondary | ICD-10-CM

## 2019-09-21 DIAGNOSIS — R252 Cramp and spasm: Secondary | ICD-10-CM

## 2019-09-21 DIAGNOSIS — Z79899 Other long term (current) drug therapy: Secondary | ICD-10-CM

## 2019-09-21 DIAGNOSIS — R0789 Other chest pain: Secondary | ICD-10-CM

## 2019-09-21 MED ORDER — VARENICLINE TARTRATE 0.5 MG X 11 & 1 MG X 42 PO MISC: ORAL | 0 refills | Status: DC

## 2019-09-21 MED ORDER — ATORVASTATIN CALCIUM 40 MG PO TABS: 40.0000 mg | ORAL_TABLET | Freq: Every day | ORAL | 3 refills | Status: DC

## 2019-09-21 MED ORDER — LOSARTAN POTASSIUM 50 MG PO TABS: 50.0000 mg | ORAL_TABLET | Freq: Every day | ORAL | 0 refills | Status: DC

## 2019-09-21 MED ORDER — GABAPENTIN 300 MG PO CAPS: 300.0000 mg | ORAL_CAPSULE | Freq: Two times a day (BID) | ORAL | 2 refills | Status: DC

## 2019-09-21 NOTE — Assessment & Plan Note (Signed)
Patient has a history of HLD, she reports that she is not taking the atorvastatin that she was prescribed, states that she does not have cholesterol issues. We discussed her lab results and her increased risk of stroke and cardiac issues. She expressed understanding and reported that she would restart her atorvastatin.

## 2019-09-21 NOTE — Patient Instructions (Addendum)
[name],  It was a pleasure to see you today. Thank you for coming in.   Today we discussed your blood pressure, leg issues, and cholesterol.  We discussed your blood pressure. Please increase your losartan to 50 mg daily and continue taking the metoprolol as prescribed. Decrease your salt intake, including burgers and fries. Please start checking your blood pressures at home as soon as possible.  I have sent in a prescription for gabapentin for your leg issues.  Please restart taking your cholesterol medications, I have sent in another prescription for this.  I have sent in a prescription for chantix to help you quit smoking. Please let us know if there is any issues with getting this medication.   Please return to clinic in 1 month or sooner if needed.   Thank you again for coming in.   [name].D.

## 2019-09-21 NOTE — Assessment & Plan Note (Signed)
Patient is currently on Losartan 25 mg daily and coreg 25 mg BID. She denies any issues taking her medications. BP today is elevated at 145/53, repeat was 156/58. She reported that she just ate some fries and a burger before coming in. She reports occasional headaches but only when she does not take her medications. We discussed increasing her losartan and she was in agreement with this plan. She reported that she will be moving in with her sister in 2 weeks and that she will be able to check her blood pressures then.   -Increase losartan to 50 mg daily -Continue coreg 25 mg d BID -Repeat BMP today

## 2019-09-21 NOTE — Assessment & Plan Note (Signed)
  Patient reports that she has been having some bilateral leg muscle spasms at night, she reports that it occurs daily and will last for 1-2 hours. She states that this has been going on for the past 6-7 years and that it worsens when it gets cold outside. She reports improvement when she takes gabapentin. On chart review patient has a history of lumbar radiculopathy and sciatica which she was prescribed gabapentin for. She also has a history of nocturnal leg cramps, neuropathy work up with TSH, B12, CK, and BMP were unremarkable at that time.   -Refill gabapentin

## 2019-09-21 NOTE — Assessment & Plan Note (Signed)
Patient has a history of chest pain that was being worked up by cardiology. She has had EKGs in the past that showed LVH and biatrial enlargement. She had a nuclear test that was inconclusive and they had ordered a CT angiography to further evaluate however patient reports that she never had this done. She denies any chest pain or SOB at this time. We discussed that she should still follow up with cardiology to obtain the imaging.   -Continue Coreg and losartan -Advised to restart atorvastatin -Follow up with cardiology

## 2019-09-21 NOTE — Progress Notes (Signed)
CC: HTN, HLD, muscle spasms  HPI:  Kathleen Stone is a 54 y.o.  with a PMH listed below presenting for HTN, HLD, and muscle spasms.   Please see A&P for status of the patient's chronic medical conditions  Past Medical History:  Diagnosis Date  . Chronic lower back pain    Negative MRI of hips bilaterally, neg CT scan of A and P; MRI of T and L spine-> very mild deg changes from T4-5 and T7-8, cervical spondylotic changes at C5-6, very mild disc bulge at L2-3 without spinal stenosis, nl ESR, ANA, RF, HIV, TSH, and B12  . DJD (degenerative joint disease) of cervical spine   . Family history of adverse reaction to anesthesia    "sisters PONV"  . Galactorrhea of both breasts 2009   R. sided w/ benign papilloma excised in 4/09. L sided in 5/09  . Hypertension   . Migraine    "monthly" (11/15/2016)  . Neuropathy    Thought to be 2/2 nerve compression in the C-spine  . Obesity   . Personal history of adenomatous colonic polyp    09/2007 - diminutive adenoma [name])  . PONV (postoperative nausea and vomiting)   . Tobacco abuse    Review of Systems: Refer to history of present illness and assessment and plans for pertinent review of systems, all others reviewed and negative.  Physical Exam:  Vitals:   09/21/19 1601 09/21/19 1638  BP: (!) 145/53 (!) 156/58  Pulse: 81   Temp: 98.1 F (36.7 C)   TempSrc: Oral   SpO2: 99%   Weight: 259 lb 11.2 oz (117.8 kg)   Height: '5\' 6"'  (1.676 m)     Physical Exam  Constitutional: She is oriented to person, place, and time and well-developed, well-nourished, and in no distress.  HENT:  Head: Normocephalic and atraumatic.  Cardiovascular: Normal rate, regular rhythm and normal heart sounds.  Pulmonary/Chest: Effort normal and breath sounds normal. No respiratory distress.  Abdominal: Soft. Bowel sounds are normal. She exhibits no distension.  Neurological: She is alert and oriented to person, place, and time.  Skin: Skin is warm and  dry.    Social History   Socioeconomic History  . Marital status: Single    Spouse name: Not on file  . Number of children: Not on file  . Years of education: Not on file  . Highest education level: Not on file  Occupational History  . Not on file  Tobacco Use  . Smoking status: Current Every Day Smoker    Packs/day: 1.00    Years: 31.00    Pack years: 31.00    Types: Cigarettes  . Smokeless tobacco: Never Used  Substance and Sexual Activity  . Alcohol use: No    Alcohol/week: 0.0 standard drinks  . Drug use: No  . Sexual activity: Yes  Other Topics Concern  . Not on file  Social History Narrative   Domestic Partner: female has emphysema   Smoked 2 packs/day for 20+ years, down to 1/2ppd now, willing to try to quit    Alcohol use-no   Drug use-no   Regular exercise-no   Social Determinants of Health   Financial Resource Strain:   . Difficulty of Paying Living Expenses: Not on file  Food Insecurity:   . Worried About Charity fundraiser in the Last Year: Not on file  . Ran Out of Food in the Last Year: Not on file  Transportation Needs:   . Lack of  Transportation (Medical): Not on file  . Lack of Transportation (Non-Medical): Not on file  Physical Activity:   . Days of Exercise per Week: Not on file  . Minutes of Exercise per Session: Not on file  Stress:   . Feeling of Stress : Not on file  Social Connections:   . Frequency of Communication with Friends and Family: Not on file  . Frequency of Social Gatherings with Friends and Family: Not on file  . Attends Religious Services: Not on file  . Active Member of Clubs or Organizations: Not on file  . Attends Archivist Meetings: Not on file  . Marital Status: Not on file  Intimate Partner Violence:   . Fear of Current or Ex-Partner: Not on file  . Emotionally Abused: Not on file  . Physically Abused: Not on file  . Sexually Abused: Not on file    Family History  Problem Relation Age of Onset  .  Heart failure Mother   . Diabetes Mother   . Breast cancer Unknown        aunt  . Liver cancer Unknown        aunt  . Lung cancer Father     Assessment & Plan:   See Encounters Tab for problem based charting.  Patient discussed with Dr. Evette Doffing

## 2019-09-21 NOTE — Assessment & Plan Note (Signed)
Patient reports that she would like to quit smoking, currently smokes about 1 ppd. Sent in a refill for Chantix.

## 2019-09-22 LAB — BMP8+ANION GAP
Anion Gap: 14 mmol/L (ref 10.0–18.0)
BUN/Creatinine Ratio: 14 (ref 9–23)
BUN: 12 mg/dL (ref 6–24)
CO2: 23 mmol/L (ref 20–29)
Calcium: 9.6 mg/dL (ref 8.7–10.2)
Chloride: 105 mmol/L (ref 96–106)
Creatinine, Ser: 0.83 mg/dL (ref 0.57–1.00)
GFR calc Af Amer: 92 mL/min/{1.73_m2} (ref >59)
GFR calc non Af Amer: 80 mL/min/{1.73_m2} (ref >59)
Glucose: 94 mg/dL (ref 65–99)
Potassium: 3.6 mmol/L (ref 3.5–5.2)
Sodium: 142 mmol/L (ref 134–144)

## 2019-09-22 NOTE — Addendum Note (Signed)
Addended by: Lalla Brothers T on: 09/22/2019 04:07 PM   Modules accepted: Level of Service

## 2019-09-22 NOTE — Progress Notes (Signed)
Internal Medicine Clinic Attending  Case discussed with Dr. Krienke at the time of the visit.  We reviewed the resident's history and exam and pertinent patient test results.  I agree with the assessment, diagnosis, and plan of care documented in the resident's note.    

## 2019-10-16 ENCOUNTER — Other Ambulatory Visit: Payer: Self-pay | Admitting: Internal Medicine

## 2019-10-16 DIAGNOSIS — I10 Essential (primary) hypertension: Secondary | ICD-10-CM

## 2019-10-30 ENCOUNTER — Ambulatory Visit (INDEPENDENT_AMBULATORY_CARE_PROVIDER_SITE_OTHER): Payer: BC Managed Care – PPO | Admitting: Internal Medicine

## 2019-10-30 ENCOUNTER — Ambulatory Visit (HOSPITAL_COMMUNITY)
Admission: RE | Admit: 2019-10-30 | Discharge: 2019-10-30 | Disposition: A | Discharge status: Home / Self Care | Payer: BC Managed Care – PPO | Source: Ambulatory Visit | Attending: Internal Medicine | Admitting: Internal Medicine

## 2019-10-30 ENCOUNTER — Encounter: Payer: Self-pay | Admitting: Internal Medicine

## 2019-10-30 ENCOUNTER — Other Ambulatory Visit: Payer: Self-pay

## 2019-10-30 VITALS — BP 140/65 | HR 62 | Temp 98.2°F | Ht 66.0 in | Wt 262.7 lb

## 2019-10-30 DIAGNOSIS — R1011 Right upper quadrant pain: Secondary | ICD-10-CM | POA: Diagnosis not present

## 2019-10-30 DIAGNOSIS — K8020 Calculus of gallbladder without cholecystitis without obstruction: Secondary | ICD-10-CM | POA: Insufficient documentation

## 2019-10-30 DIAGNOSIS — Z720 Tobacco use: Secondary | ICD-10-CM

## 2019-10-30 DIAGNOSIS — R001 Bradycardia, unspecified: Secondary | ICD-10-CM | POA: Diagnosis not present

## 2019-10-30 DIAGNOSIS — R140 Abdominal distension (gaseous): Secondary | ICD-10-CM

## 2019-10-30 DIAGNOSIS — Z6841 Body Mass Index (BMI) 40.0 and over, adult: Secondary | ICD-10-CM

## 2019-10-30 DIAGNOSIS — E785 Hyperlipidemia, unspecified: Secondary | ICD-10-CM | POA: Diagnosis not present

## 2019-10-30 DIAGNOSIS — R9431 Abnormal electrocardiogram [ECG] [EKG]: Secondary | ICD-10-CM | POA: Diagnosis not present

## 2019-10-30 DIAGNOSIS — F17200 Nicotine dependence, unspecified, uncomplicated: Secondary | ICD-10-CM

## 2019-10-30 HISTORY — DX: Calculus of gallbladder without cholecystitis without obstruction: K80.20

## 2019-10-30 MED ORDER — NICOTINE 7 MG/24HR TD PT24: 7.0000 mg | MEDICATED_PATCH | TRANSDERMAL | 0 refills | Status: AC

## 2019-10-30 NOTE — Patient Instructions (Signed)
Kathleen Stone,   Thanks for seeing [place] today. I have discontinued your chantix. I am ordering labs and abdominal ultrasound today.   Take care! Dr. Eileen Stanford  Please call the internal medicine center clinic if you have any questions or concerns, we may be able to help and keep you from a long and expensive emergency room wait. Our clinic and after hours phone number is [phone], the best time to call is Monday through Friday 9 am to 4 pm but there is always someone available 24/7 if you have an emergency. If you need medication refills please notify your pharmacy one week in advance and they will send [place] a request.

## 2019-10-30 NOTE — Assessment & Plan Note (Addendum)
RUQ Pain: [name] states that she was in her usual state of health until 3 days ago when she began experiencing intermittent right upper quadrant pain that feels like "stabbing, electricity through her abdomen.  "She denies fevers, chills, nausea, vomiting or any radiating pain.  She also describes increased fluctuance and body soreness.  She denies constipation and her abdominal pain is not related to specific diet. She does mention that sometimes the pain radiate to the right flank but denies hematuria or radiation to the groin. She rates her pain at 9/10.  Per chart review, she was recently evaluated in the clinic on 09/21/2019 and was restarted on Lipitor and prescribed Chantix for tobacco use disorder.  On physical examination, she has significant abdominal fat, RUQ is TTP, no guarging or rigidity, BS present. EKG shows old T-wave changes with J point elevation in V1. She has no angina symptoms and current active chest pain. She was referred to cardiology in September but has not followed up  Adverse: Adverse effect of chantix vs Symptomatic cholelithiasis vs Rhabdomyolysis vs Nephrolithiasis  Plan: -RUQ U/S -CMP -CK -UA -Discontinue Chantix  -CBC  -F/u cardiology referral placed in September  ADDENDUM: RUQ U/S Gallbladder mildly distended with cholelithiasis. Gallstone noted near the neck of the gallbladder, potentially obstructing a portion of the cystic duct. There is also sludge in the gallbladder an apparent ring down artifacts suggesting gallbladder wall inflammation such as adenomyomatosis or cholesterolosis

## 2019-10-30 NOTE — Progress Notes (Signed)
   CC: RUQ pain, Bloating, body soreness  HPI:  Kathleen Stone is a 54 y.o. community dwelling woman with medical history significant for morbid obesity, tobacco use disorder, hyperlipidemia presenting for evaluation of bloating, right upper quadrant and body soreness.  Past Medical History:  Diagnosis Date  . Chronic lower back pain    Negative MRI of hips bilaterally, neg CT scan of A and P; MRI of T and L spine-> very mild deg changes from T4-5 and T7-8, cervical spondylotic changes at C5-6, very mild disc bulge at L2-3 without spinal stenosis, nl ESR, ANA, RF, HIV, TSH, and B12  . DJD (degenerative joint disease) of cervical spine   . Family history of adverse reaction to anesthesia    "sisters PONV"  . Galactorrhea of both breasts 2009   R. sided w/ benign papilloma excised in 4/09. L sided in 5/09  . Hypertension   . Migraine    "monthly" (11/15/2016)  . Neuropathy    Thought to be 2/2 nerve compression in the C-spine  . Obesity   . Personal history of adenomatous colonic polyp    09/2007 - diminutive adenoma [name])  . PONV (postoperative nausea and vomiting)   . Tobacco abuse    Review of Systems:   Review of Systems  Constitutional: Negative for chills and fever.       Soreness  Gastrointestinal: Positive for abdominal pain (RUQ). Negative for blood in stool, constipation, diarrhea, nausea and vomiting.       Bloating    Physical Exam:  Vitals:   10/30/19 0909  BP: 140/65  Pulse: 62  Temp: 98.2 F (36.8 C)  TempSrc: Oral  SpO2: 98%  Weight: 262 lb 11.2 oz (119.2 kg)  Height: '5\' 6"'$  (1.676 m)   Physical Exam  Constitutional: She is well-developed, well-nourished, and in no distress. No distress.  Cardiovascular: Normal rate, regular rhythm and normal heart sounds.  Pulmonary/Chest: Effort normal and breath sounds normal.  Abdominal: Bowel sounds are normal. Distension: RUQ. There is abdominal tenderness. There is no rebound and no guarding.  Significant  abdominal fat  Skin: She is not diaphoretic.    Assessment & Plan:   See Encounters Tab for problem based charting.  Patient discussed with Dr. Heber Funston

## 2019-10-31 LAB — URINALYSIS, ROUTINE W REFLEX MICROSCOPIC
Bilirubin, UA: NEGATIVE
Glucose, UA: NEGATIVE
Ketones, UA: NEGATIVE
Leukocytes,UA: NEGATIVE
Nitrite, UA: NEGATIVE
Protein,UA: NEGATIVE
RBC, UA: NEGATIVE
Specific Gravity, UA: 1.022 (ref 1.005–1.030)
Urobilinogen, Ur: 0.2 mg/dL (ref 0.2–1.0)
pH, UA: 5 (ref 5.0–7.5)

## 2019-10-31 LAB — CMP14 + ANION GAP
ALT: 12 IU/L (ref 0–32)
AST: 10 IU/L (ref 0–40)
Albumin/Globulin Ratio: 1.6 (ref 1.2–2.2)
Albumin: 4.2 g/dL (ref 3.8–4.9)
Alkaline Phosphatase: 117 IU/L (ref 39–117)
Anion Gap: 17 mmol/L (ref 10.0–18.0)
BUN/Creatinine Ratio: 16 (ref 9–23)
BUN: 14 mg/dL (ref 6–24)
Bilirubin Total: 0.5 mg/dL (ref 0.0–1.2)
CO2: 21 mmol/L (ref 20–29)
Calcium: 9.8 mg/dL (ref 8.7–10.2)
Chloride: 104 mmol/L (ref 96–106)
Creatinine, Ser: 0.88 mg/dL (ref 0.57–1.00)
GFR calc Af Amer: 86 mL/min/{1.73_m2} (ref >59)
GFR calc non Af Amer: 74 mL/min/{1.73_m2} (ref >59)
Globulin, Total: 2.7 g/dL (ref 1.5–4.5)
Glucose: 93 mg/dL (ref 65–99)
Potassium: 4.5 mmol/L (ref 3.5–5.2)
Sodium: 142 mmol/L (ref 134–144)
Total Protein: 6.9 g/dL (ref 6.0–8.5)

## 2019-10-31 LAB — CK: Total CK: 62 U/L (ref 32–182)

## 2019-11-02 ENCOUNTER — Telehealth: Payer: Self-pay | Admitting: Internal Medicine

## 2019-11-02 NOTE — Telephone Encounter (Signed)
Called pt to discuss results.

## 2019-11-03 NOTE — Progress Notes (Signed)
Internal Medicine Clinic Attending  Case discussed with Dr. Agyei at the time of the visit.  We reviewed the resident's history and exam and pertinent patient test results.  I agree with the assessment, diagnosis, and plan of care documented in the resident's note.    

## 2019-11-04 ENCOUNTER — Telehealth: Payer: Self-pay

## 2019-11-04 ENCOUNTER — Other Ambulatory Visit: Payer: Self-pay

## 2019-11-04 ENCOUNTER — Ambulatory Visit (HOSPITAL_COMMUNITY)
Admission: RE | Admit: 2019-11-04 | Discharge: 2019-11-04 | Disposition: A | Discharge status: Home / Self Care | Payer: BC Managed Care – PPO | Source: Ambulatory Visit | Attending: Internal Medicine | Admitting: Internal Medicine

## 2019-11-04 ENCOUNTER — Other Ambulatory Visit: Payer: Self-pay | Admitting: Internal Medicine

## 2019-11-04 DIAGNOSIS — R1011 Right upper quadrant pain: Secondary | ICD-10-CM | POA: Diagnosis not present

## 2019-11-04 DIAGNOSIS — K8020 Calculus of gallbladder without cholecystitis without obstruction: Secondary | ICD-10-CM | POA: Diagnosis not present

## 2019-11-04 DIAGNOSIS — K7689 Other specified diseases of liver: Secondary | ICD-10-CM | POA: Diagnosis not present

## 2019-11-04 DIAGNOSIS — F17200 Nicotine dependence, unspecified, uncomplicated: Secondary | ICD-10-CM

## 2019-11-04 MED ORDER — VARENICLINE TARTRATE 0.5 MG PO TABS: 0.5000 mg | ORAL_TABLET | Freq: Two times a day (BID) | ORAL | 3 refills | Status: DC

## 2019-11-04 NOTE — Progress Notes (Signed)
Called patient to discuss abd ultrasound

## 2019-11-04 NOTE — Telephone Encounter (Signed)
Received TC from Opal Sidles at [facility] regarding US Abdomen Limited RUQ done today.  States this was done stat and to make MD aware of impression. Pt was seen in clinic on 10/30/19 by Dr. Eileen Stanford and ordering MD on record is Dr. Heber Wilson. Thank you, SChaplin, RN,BSN

## 2019-11-04 NOTE — Telephone Encounter (Signed)
Reviewed results, cholelithiasis noted, gallstone near neck, labs are reassuring no evidence of obstruction. Will have dr Eileen Stanford relay results to patient and place surgical referral for cholecystectomy.also guidance if pain or symptoms worsen please call or if severe go to ED.

## 2019-11-06 NOTE — Telephone Encounter (Signed)
DrAgyei spoke with patient about results and placed referral to general surgeon.No further action needed at this time, phone call complete.Despina Hidden Cassady2/19/20218:56 AM

## 2019-11-24 ENCOUNTER — Ambulatory Visit: Payer: Self-pay | Admitting: General Surgery

## 2019-11-24 DIAGNOSIS — K8020 Calculus of gallbladder without cholecystitis without obstruction: Secondary | ICD-10-CM | POA: Diagnosis not present

## 2019-11-24 NOTE — H&P (Signed)
History of Present Illness Ralene Ok MD; 11/24/2019 2:26 PM) The patient is a 54 year old female who presents for evaluation of gall stones. Chief Complaint: Abdominal pain  Patient is a 54 year old female with a history of hypertension, comes in secondary to a 2-3 month history of epigastric abdominal pain. Patient states this is usually related night or after eating. Patient is unable to tell me whether or not this is related to high fatty meals. Patient underwent an ultrasound recently which revealed large gallstones, 2.8 cm as well as one near the neck of the gallbladder. I did review the ultrasound personally. Patient has had previous open appendectomy with a periumbilical midline incision.    Past Surgical History ([name], CMA; 11/24/2019 1:57 PM) Appendectomy  Breast Biopsy  Right. Shoulder Surgery  Right.  Diagnostic Studies History ([name], CMA; 11/24/2019 1:57 PM) Colonoscopy  5-10 years ago Mammogram  1-3 years ago Pap Smear  >5 years ago  Allergies ([name], CMA; 11/24/2019 1:58 PM) No Known Allergies [11/24/2019]: No Known Drug Allergies [11/24/2019]: Allergies Reconciled   Medication History ([name], CMA; 11/24/2019 1:58 PM) Losartan Potassium (25MG  Tablet, Oral) Active. Gabapentin (300MG  Capsule, Oral) Active. Metoprolol Tartrate (25MG  Tablet, Oral) Active. Medications Reconciled  Social History [name], CMA; 11/24/2019 1:57 PM) Caffeine use  Carbonated beverages. No alcohol use  No drug use  Tobacco use  Current every day smoker.  Family History ([name], [place]; 11/24/2019 1:57 PM) Alcohol Abuse  Family Members In General. Arthritis  Family Members In General, Father, Mother, Sister. Breast Cancer  Family Members In General. Cerebrovascular Accident  Brother. Diabetes Mellitus  Family Members In General, Father, Mother. Hypertension  Brother, Family Members In [place], Father, Mother, Sister. Kidney  Disease  Mother. Malignant Neoplasm Of Pancreas  Family Members In General. Prostate Cancer  Family Members In General. Thyroid problems  Mother, Sister.  Pregnancy / Birth History [name], [place]; 11/24/2019 1:57 PM) Age at menarche  62 years. Age of menopause  31-55 Gravida  1 Irregular periods  Maternal age  3-40 Para  1  Other Problems ([name], [place]; 11/24/2019 1:57 PM) Cholelithiasis  High blood pressure  Hypercholesterolemia  Kidney [name]     Review of Systems Ralene Ok MD; 11/24/2019 2:25 PM) General Present- Fatigue and Night Sweats. Not Present- Appetite Loss, Chills, Fever, Weight Gain and Weight Loss. Skin Present- Dryness. Not Present- Change in Wart/Mole, Hives, Jaundice, New Lesions, Non-Healing Wounds, Rash and Ulcer. HEENT Not Present- Earache, Hearing Loss, Hoarseness, Nose Bleed, Oral Ulcers, Ringing in the Ears, Seasonal Allergies, Sinus Pain, Sore Throat, Visual Disturbances, Wears glasses/contact lenses and Yellow Eyes. Respiratory Not Present- Bloody sputum, Chronic Cough, Difficulty Breathing, Snoring and Wheezing. Breast Not Present- Breast Mass, Breast Pain, Nipple Discharge and Skin Changes. Cardiovascular Present- Leg Cramps and Swelling of Extremities. Not Present- Chest Pain, Difficulty Breathing Lying Down, Palpitations, Rapid Heart Rate and Shortness of Breath. Gastrointestinal Present- Excessive gas. Not Present- Abdominal Pain, Bloating, Bloody Stool, Change in Bowel Habits, Chronic diarrhea, Constipation, Difficulty Swallowing, Gets full quickly at meals, Hemorrhoids, Indigestion, Nausea, Rectal Pain and Vomiting. Female Genitourinary Not Present- Frequency, Nocturia, Painful Urination, Pelvic Pain and Urgency. Musculoskeletal Present- Back Pain, Joint Pain, Joint Stiffness, Muscle Pain, Muscle Weakness and Swelling of Extremities. Neurological Present- Headaches. Not Present- Decreased Memory, Fainting, Numbness, Seizures,  Tingling, Tremor, Trouble walking and Weakness. Psychiatric Not Present- Anxiety, Bipolar, Change in Sleep Pattern, Depression, Fearful and Frequent crying. Endocrine Not Present- Cold Intolerance, Excessive Hunger, Hair Changes,  Heat Intolerance, Hot flashes and New Diabetes. Hematology Not Present- Blood Thinners, Easy Bruising, Excessive bleeding, Gland problems, HIV and Persistent Infections. All other systems negative  Vitals (Chanel Nolan CMA; 11/24/2019 1:58 PM) 11/24/2019 1:58 PM Weight: 261 lb Height: 65in Body Surface Area: 2.22 m Body Mass Index: 43.43 kg/m  Temp.: 98.41F  Pulse: 85 (Regular)  BP: 132/82 (Sitting, Left Arm, Standard)       Physical Exam Ralene Ok MD; 11/24/2019 2:26 PM) The physical exam findings are as follows: Note:Constitutional: No acute distress, conversant, appears stated age  Eyes: Anicteric sclerae, moist conjunctiva, no lid lag  Neck: No thyromegaly, trachea midline, no cervical lymphadenopathy  Lungs: Clear to auscultation biilaterally, normal respiratory effot  Cardiovascular: regular rate & rhythm, no murmurs, no peripheal edema, pedal pulses 2+  GI: Soft, no masses or hepatosplenomegaly, non-tender to palpation, well-healed midline periumbilical incision  MSK: Normal gait, no clubbing cyanosis, edema  Skin: No rashes, palpation reveals normal skin turgor  Psychiatric: Appropriate judgment and insight, oriented to person, place, and time    Assessment & Plan Ralene Ok MD; 11/24/2019 2:27 PM) SYMPTOMATIC CHOLELITHIASIS (K80.20) Impression: 54 year old female with history of hypertension, symptomatic cholelithiasis 1. We will proceed to the operating room for a laparoscopic cholecystectomy  2. Risks and benefits were discussed with the patient to generally include, but not limited to: infection, bleeding, possible need for post op ERCP, damage to the bile ducts, bile leak, and possible need for further surgery.  Alternatives were offered and described. All questions were answered and the patient voiced understanding of the procedure and wishes to proceed at this point with a laparoscopic cholecystectomy

## 2019-11-24 NOTE — H&P (View-Only) (Signed)
History of Present Illness Kathleen Stone; 11/24/2019 2:26 PM) The patient is a 54 year old female who presents for evaluation of gall stones. Chief Complaint: Abdominal pain  Patient is a 54 year old female with a history of hypertension, comes in secondary to a 2-3 month history of epigastric abdominal pain. Patient states this is usually related night or after eating. Patient is unable to tell me whether or not this is related to high fatty meals. Patient underwent an ultrasound recently which revealed large gallstones, 2.8 cm as well as one near the neck of the gallbladder. I did review the ultrasound personally. Patient has had previous open appendectomy with a periumbilical midline incision.    Past Surgical History ([name], CMA; 11/24/2019 1:57 PM) Appendectomy  Breast Biopsy  Right. Shoulder Surgery  Right.  Diagnostic Studies History ([name], CMA; 11/24/2019 1:57 PM) Colonoscopy  5-10 years ago Mammogram  1-3 years ago Pap Smear  >5 years ago  Allergies ([name], CMA; 11/24/2019 1:58 PM) No Known Allergies [11/24/2019]: No Known Drug Allergies [11/24/2019]: Allergies Reconciled   Medication History ([name], CMA; 11/24/2019 1:58 PM) Losartan Potassium (25MG  Tablet, Oral) Active. Gabapentin (300MG  Capsule, Oral) Active. Metoprolol Tartrate (25MG  Tablet, Oral) Active. Medications Reconciled  Social History [name], CMA; 11/24/2019 1:57 PM) Caffeine use  Carbonated beverages. No alcohol use  No drug use  Tobacco use  Current every day smoker.  Family History ([name], [place]; 11/24/2019 1:57 PM) Alcohol Abuse  Family Members In General. Arthritis  Family Members In General, Father, Mother, Sister. Breast Cancer  Family Members In General. Cerebrovascular Accident  Brother. Diabetes Mellitus  Family Members In General, Father, Mother. Hypertension  Brother, Family Members In [place], Father, Mother, Sister. Kidney  Disease  Mother. Malignant Neoplasm Of Pancreas  Family Members In General. Prostate Cancer  Family Members In General. Thyroid problems  Mother, Sister.  Pregnancy / Birth History [name], [place]; 11/24/2019 1:57 PM) Age at menarche  44 years. Age of menopause  69-55 Gravida  1 Irregular periods  Maternal age  28-40 Para  1  Other Problems ([name], [place]; 11/24/2019 1:57 PM) Cholelithiasis  High blood pressure  Hypercholesterolemia  Kidney [name]     Review of Systems Kathleen Stone; 11/24/2019 2:25 PM) General Present- Fatigue and Night Sweats. Not Present- Appetite Loss, Chills, Fever, Weight Gain and Weight Loss. Skin Present- Dryness. Not Present- Change in Wart/Mole, Hives, Jaundice, New Lesions, Non-Healing Wounds, Rash and Ulcer. HEENT Not Present- Earache, Hearing Loss, Hoarseness, Nose Bleed, Oral Ulcers, Ringing in the Ears, Seasonal Allergies, Sinus Pain, Sore Throat, Visual Disturbances, Wears glasses/contact lenses and Yellow Eyes. Respiratory Not Present- Bloody sputum, Chronic Cough, Difficulty Breathing, Snoring and Wheezing. Breast Not Present- Breast Mass, Breast Pain, Nipple Discharge and Skin Changes. Cardiovascular Present- Leg Cramps and Swelling of Extremities. Not Present- Chest Pain, Difficulty Breathing Lying Down, Palpitations, Rapid Heart Rate and Shortness of Breath. Gastrointestinal Present- Excessive gas. Not Present- Abdominal Pain, Bloating, Bloody Stool, Change in Bowel Habits, Chronic diarrhea, Constipation, Difficulty Swallowing, Gets full quickly at meals, Hemorrhoids, Indigestion, Nausea, Rectal Pain and Vomiting. Female Genitourinary Not Present- Frequency, Nocturia, Painful Urination, Pelvic Pain and Urgency. Musculoskeletal Present- Back Pain, Joint Pain, Joint Stiffness, Muscle Pain, Muscle Weakness and Swelling of Extremities. Neurological Present- Headaches. Not Present- Decreased Memory, Fainting, Numbness, Seizures,  Tingling, Tremor, Trouble walking and Weakness. Psychiatric Not Present- Anxiety, Bipolar, Change in Sleep Pattern, Depression, Fearful and Frequent crying. Endocrine Not Present- Cold Intolerance, Excessive Hunger, Hair Changes,  Heat Intolerance, Hot flashes and New Diabetes. Hematology Not Present- Blood Thinners, Easy Bruising, Excessive bleeding, Gland problems, HIV and Persistent Infections. All other systems negative  Vitals (Chanel Nolan CMA; 11/24/2019 1:58 PM) 11/24/2019 1:58 PM Weight: 261 lb Height: 65in Body Surface Area: 2.22 m Body Mass Index: 43.43 kg/m  Temp.: 98.66F  Pulse: 85 (Regular)  BP: 132/82 (Sitting, Left Arm, Standard)       Physical Exam Kathleen Stone; 11/24/2019 2:26 PM) The physical exam findings are as follows: Note:Constitutional: No acute distress, conversant, appears stated age  Eyes: Anicteric sclerae, moist conjunctiva, no lid lag  Neck: No thyromegaly, trachea midline, no cervical lymphadenopathy  Lungs: Clear to auscultation biilaterally, normal respiratory effot  Cardiovascular: regular rate & rhythm, no murmurs, no peripheal edema, pedal pulses 2+  GI: Soft, no masses or hepatosplenomegaly, non-tender to palpation, well-healed midline periumbilical incision  MSK: Normal gait, no clubbing cyanosis, edema  Skin: No rashes, palpation reveals normal skin turgor  Psychiatric: Appropriate judgment and insight, oriented to person, place, and time    Assessment & Plan Kathleen Stone; 11/24/2019 2:27 PM) SYMPTOMATIC CHOLELITHIASIS (K80.20) Impression: 54 year old female with history of hypertension, symptomatic cholelithiasis 1. We will proceed to the operating room for a laparoscopic cholecystectomy  2. Risks and benefits were discussed with the patient to generally include, but not limited to: infection, bleeding, possible need for post op ERCP, damage to the bile ducts, bile leak, and possible need for further surgery.  Alternatives were offered and described. All questions were answered and the patient voiced understanding of the procedure and wishes to proceed at this point with a laparoscopic cholecystectomy

## 2019-11-29 ENCOUNTER — Other Ambulatory Visit: Payer: Self-pay | Admitting: Internal Medicine

## 2019-11-29 DIAGNOSIS — M5416 Radiculopathy, lumbar region: Secondary | ICD-10-CM

## 2019-12-04 ENCOUNTER — Other Ambulatory Visit (HOSPITAL_COMMUNITY)
Admission: RE | Admit: 2019-12-04 | Discharge: 2019-12-04 | Disposition: A | Discharge status: Home / Self Care | Payer: BC Managed Care – PPO | Source: Ambulatory Visit | Attending: General Surgery | Admitting: General Surgery

## 2019-12-04 DIAGNOSIS — Z20822 Contact with and (suspected) exposure to covid-19: Secondary | ICD-10-CM | POA: Diagnosis not present

## 2019-12-04 DIAGNOSIS — Z01812 Encounter for preprocedural laboratory examination: Secondary | ICD-10-CM | POA: Insufficient documentation

## 2019-12-04 LAB — SARS CORONAVIRUS 2 (TAT 6-24 HRS): SARS Coronavirus 2: NEGATIVE

## 2019-12-07 ENCOUNTER — Encounter (HOSPITAL_COMMUNITY): Payer: Self-pay | Admitting: General Surgery

## 2019-12-07 MED ORDER — DEXTROSE 5 % IV SOLN
3.0000 g | INTRAVENOUS | Status: AC
  Administered 2019-12-08: 3 g via INTRAVENOUS
  Filled 2019-12-07: qty 3

## 2019-12-07 NOTE — Progress Notes (Signed)
Anesthesia Chart Review: Same day workup  Patient has a history of chest pain and abn ekg that was being worked up by cardiology. EKG in 2018 showed lateral T wave inversions. She had a nuclear test that was read as low risk but said subtle ischemia could not be ruled out. CT angiography to further evaluate however patient never had this done. She has been advised by PCP recently to follow up with cardiology to complete testing.   Seen by PCP 10/30/19 for acute abdominal pain. EKG showed old T-wave changes with J point elevation in V1. She denied chest pain. RUQ [place] shoed gallbladder mildly distended with cholelithiasis. Gallstone noted near the neck of the gallbladder, potentially obstructing a portion of the cystic duct.   Discussed case with Dr. Tobias Alexander, okay to proceed as planned.  Will need DOS labs and eval.   EKG 10/30/19: Sinus bradycardia with Fusion complexes. Rate 54. Right superior axis deviation. Septal infarct , age undetermined. T wave abnormality, consider lateral ischemia. No significant change since last tracing  Nuclear stress 01/02/17:  The left ventricular ejection fraction is normal (55-65%).  Nuclear stress EF: 64%.  This is a low risk study.  There is a medium defect of very mild severity present in the basal anterior, mid anterior and apical anterior location. The defect is reversible but likely represents variations in breast attenuation artifact but cannot rule out subtle ischemia.  TTE 11/16/16: - Left ventricle: The cavity size was normal. Wall thickness was  increased in a pattern of severe LVH. Systolic function was  vigorous. The estimated ejection fraction was in the range of 65%  to 70%. Wall motion was normal; there were no regional wall  motion abnormalities. Features are consistent with a pseudonormal  left ventricular filling pattern, with concomitant abnormal  relaxation and increased filling pressure (grade 2 diastolic  dysfunction).  Doppler parameters are consistent with high  ventricular filling pressure.  - Left atrium: The atrium was mildly dilated.  - Pericardium, extracardiac: A trivial pericardial effusion was  identified.   Impressions:   - Vigorous LV systolic function; severe LVH; grade 2 diastolic  dysfunction with elevated LV filling pressure; mild LAE.  Wynonia Musty [facility] Short Stay Center/Anesthesiology Phone [phone] 12/07/2019 4:06 PM

## 2019-12-07 NOTE — Anesthesia Preprocedure Evaluation (Addendum)
Anesthesia Evaluation  Patient identified by MRN, date of birth, ID band Patient awake    Reviewed: Allergy & Precautions, NPO status , Patient's Chart, lab work & pertinent test results, reviewed documented beta blocker date and time   History of Anesthesia Complications (+) PONV and history of anesthetic complications  Airway Mallampati: II  TM Distance: >3 FB Neck ROM: Full    Dental no notable dental hx.    Pulmonary Current SmokerPatient did not abstain from smoking.,    Pulmonary exam normal breath sounds clear to auscultation       Cardiovascular hypertension, Pt. on medications and Pt. on home beta blockers Normal cardiovascular exam Rhythm:Regular Rate:Normal  ECG: Sinus bradycardia with Fusion complexes Right superior axis deviation Septal infarct , age undetermined   Neuro/Psych  Headaches, PSYCHIATRIC DISORDERS Depression Neuropathy  Neuromuscular disease    GI/Hepatic negative GI ROS, Neg liver ROS,   Endo/Other  Morbid obesity  Renal/GU negative Renal ROS     Musculoskeletal Chronic lower back pain   Abdominal   Peds  Hematology HLD   Anesthesia Other Findings GALLSTONES  Reproductive/Obstetrics                            Anesthesia Physical Anesthesia Plan  ASA: III  Anesthesia Plan: General   Post-op Pain Management:    Induction: Intravenous  PONV Risk Score and Plan: 4 or greater and Ondansetron, Dexamethasone, Midazolam, Scopolamine patch - Pre-op and Treatment may vary due to age or medical condition  Airway Management Planned: Oral ETT  Additional Equipment:   Intra-op Plan:   Post-operative Plan: Extubation in OR  Informed Consent: I have reviewed the patients History and Physical, chart, labs and discussed the procedure including the risks, benefits and alternatives for the proposed anesthesia with the patient or authorized representative who has  indicated his/her understanding and acceptance.     Dental advisory given  Plan Discussed with: CRNA  Anesthesia Plan Comments: (Per PA-C:  Patient has a history of chest pain and abn ekg that was being worked up by cardiology. EKG in 2018 showed lateral T wave inversions. She had a nuclear test that was read as low risk but said subtle ischemia could not be ruled out. CT angiography to further evaluate however patient never had this done. She has been advised by PCP recently to follow up with cardiology to complete testing.   Seen by PCP 10/30/19 for acute abdominal pain. EKG showed old T-wave changes with J point elevation in V1. She denied chest pain. RUQ [place] shoed gallbladder mildly distended with cholelithiasis. Gallstone noted near the neck of the gallbladder, potentially obstructing a portion of the cystic duct.   Discussed case with Dr. Tobias Alexander, okay to proceed as planned.  Will need DOS labs and eval.   EKG 10/30/19: Sinus bradycardia with Fusion complexes. Rate 54. Right superior axis deviation. Septal infarct , age undetermined. T wave abnormality, consider lateral ischemia. No significant change since last tracing  Nuclear stress 01/02/17: The left ventricular ejection fraction is normal (55-65%). Nuclear stress EF: 64%. This is a low risk study. There is a medium defect of very mild severity present in the basal anterior, mid anterior and apical anterior location. The defect is reversible but likely represents variations in breast attenuation artifact but cannot rule out subtle ischemia.  TTE 11/16/16: Left ventricle: The cavity size was normal. Wall thickness was  increased in a pattern of severe LVH. Systolic function  was  vigorous. The estimated ejection fraction was in the range of 65%  to 70%. Wall motion was normal; there were no regional wall  motion abnormalities. Features are consistent with a pseudonormal  left ventricular filling pattern, with concomitant abnormal   relaxation and increased filling pressure (grade 2 diastolic  dysfunction). Doppler parameters are consistent with high  ventricular filling pressure.  Left atrium: The atrium was mildly dilated.  Pericardium, extracardiac: A trivial pericardial effusion was  identified.   Impressions:  Vigorous LV systolic function; severe LVH; grade 2 diastolic  dysfunction with elevated LV filling pressure; mild LAE.)       Anesthesia Quick Evaluation

## 2019-12-07 NOTE — Progress Notes (Signed)
Denies chest pain or shortness of breath. Reports compliance with quarantine post COVID test. Educated on visitation policy.

## 2019-12-08 ENCOUNTER — Encounter (HOSPITAL_COMMUNITY)
Admission: RE | Disposition: A | Discharge status: Home / Self Care | Payer: Self-pay | Source: Home / Self Care | Attending: General Surgery

## 2019-12-08 ENCOUNTER — Ambulatory Visit (HOSPITAL_COMMUNITY): Payer: BC Managed Care – PPO | Admitting: Physician Assistant

## 2019-12-08 ENCOUNTER — Ambulatory Visit (HOSPITAL_COMMUNITY)
Admission: RE | Admit: 2019-12-08 | Discharge: 2019-12-08 | Disposition: A | Discharge status: Home / Self Care | Payer: BC Managed Care – PPO | Attending: General Surgery | Admitting: General Surgery

## 2019-12-08 ENCOUNTER — Other Ambulatory Visit: Payer: Self-pay

## 2019-12-08 DIAGNOSIS — G43909 Migraine, unspecified, not intractable, without status migrainosus: Secondary | ICD-10-CM | POA: Diagnosis not present

## 2019-12-08 DIAGNOSIS — I10 Essential (primary) hypertension: Secondary | ICD-10-CM | POA: Insufficient documentation

## 2019-12-08 DIAGNOSIS — G8929 Other chronic pain: Secondary | ICD-10-CM | POA: Insufficient documentation

## 2019-12-08 DIAGNOSIS — Z6841 Body Mass Index (BMI) 40.0 and over, adult: Secondary | ICD-10-CM | POA: Insufficient documentation

## 2019-12-08 DIAGNOSIS — K8010 Calculus of gallbladder with chronic cholecystitis without obstruction: Secondary | ICD-10-CM | POA: Diagnosis not present

## 2019-12-08 DIAGNOSIS — E785 Hyperlipidemia, unspecified: Secondary | ICD-10-CM | POA: Diagnosis not present

## 2019-12-08 DIAGNOSIS — E7800 Pure hypercholesterolemia, unspecified: Secondary | ICD-10-CM | POA: Insufficient documentation

## 2019-12-08 DIAGNOSIS — M545 Low back pain: Secondary | ICD-10-CM | POA: Insufficient documentation

## 2019-12-08 DIAGNOSIS — F17200 Nicotine dependence, unspecified, uncomplicated: Secondary | ICD-10-CM | POA: Insufficient documentation

## 2019-12-08 DIAGNOSIS — K8020 Calculus of gallbladder without cholecystitis without obstruction: Secondary | ICD-10-CM | POA: Diagnosis not present

## 2019-12-08 HISTORY — DX: Calculus of gallbladder without cholecystitis without obstruction: K80.20

## 2019-12-08 HISTORY — PX: CHOLECYSTECTOMY: SHX55

## 2019-12-08 LAB — POCT I-STAT, CHEM 8
BUN: 19 mg/dL (ref 6–20)
Calcium, Ion: 1.17 mmol/L (ref 1.15–1.40)
Chloride: 108 mmol/L (ref 98–111)
Creatinine, Ser: 0.8 mg/dL (ref 0.44–1.00)
Glucose, Bld: 88 mg/dL (ref 70–99)
HCT: 43 % (ref 36.0–46.0)
Hemoglobin: 14.6 g/dL (ref 12.0–15.0)
Potassium: 4.7 mmol/L (ref 3.5–5.1)
Sodium: 140 mmol/L (ref 135–145)
TCO2: 28 mmol/L (ref 22–32)

## 2019-12-08 SURGERY — LAPAROSCOPIC CHOLECYSTECTOMY
Anesthesia: General | Site: Abdomen

## 2019-12-08 MED ORDER — MIDAZOLAM HCL 5 MG/5ML IJ SOLN
INTRAMUSCULAR | Status: DC | PRN
  Administered 2019-12-08: 2 mg via INTRAVENOUS

## 2019-12-08 MED ORDER — CHLORHEXIDINE GLUCONATE CLOTH 2 % EX PADS: 6.0000 | MEDICATED_PAD | Freq: Once | CUTANEOUS | Status: DC

## 2019-12-08 MED ORDER — ONDANSETRON HCL 4 MG/2ML IJ SOLN
INTRAMUSCULAR | Status: AC
  Filled 2019-12-08: qty 2

## 2019-12-08 MED ORDER — HYDROMORPHONE HCL 1 MG/ML IJ SOLN
INTRAMUSCULAR | Status: AC
  Filled 2019-12-08: qty 1

## 2019-12-08 MED ORDER — LIDOCAINE 2% (20 MG/ML) 5 ML SYRINGE
INTRAMUSCULAR | Status: DC | PRN
  Administered 2019-12-08: 60 mg via INTRAVENOUS

## 2019-12-08 MED ORDER — ONDANSETRON HCL 4 MG/2ML IJ SOLN
INTRAMUSCULAR | Status: DC | PRN
  Administered 2019-12-08: 4 mg via INTRAVENOUS

## 2019-12-08 MED ORDER — SCOPOLAMINE 1 MG/3DAYS TD PT72: 1.0000 | MEDICATED_PATCH | TRANSDERMAL | Status: DC

## 2019-12-08 MED ORDER — KETOROLAC TROMETHAMINE 30 MG/ML IJ SOLN
INTRAMUSCULAR | Status: AC
  Filled 2019-12-08: qty 1

## 2019-12-08 MED ORDER — PROMETHAZINE HCL 25 MG/ML IJ SOLN
INTRAMUSCULAR | Status: AC
  Filled 2019-12-08: qty 1

## 2019-12-08 MED ORDER — SCOPOLAMINE 1 MG/3DAYS TD PT72
MEDICATED_PATCH | TRANSDERMAL | Status: AC
  Administered 2019-12-08: 1.5 mg via TRANSDERMAL
  Filled 2019-12-08: qty 1

## 2019-12-08 MED ORDER — FENTANYL CITRATE (PF) 250 MCG/5ML IJ SOLN
INTRAMUSCULAR | Status: AC
  Filled 2019-12-08: qty 5

## 2019-12-08 MED ORDER — 0.9 % SODIUM CHLORIDE (POUR BTL) OPTIME
TOPICAL | Status: DC | PRN
  Administered 2019-12-08: 1000 mL

## 2019-12-08 MED ORDER — ACETAMINOPHEN 500 MG PO TABS
ORAL_TABLET | ORAL | Status: AC
  Administered 2019-12-08: 1000 mg via ORAL
  Filled 2019-12-08: qty 2

## 2019-12-08 MED ORDER — OXYCODONE HCL 5 MG PO TABS
ORAL_TABLET | ORAL | Status: AC
  Filled 2019-12-08: qty 1

## 2019-12-08 MED ORDER — LIDOCAINE 2% (20 MG/ML) 5 ML SYRINGE
INTRAMUSCULAR | Status: AC
  Filled 2019-12-08: qty 5

## 2019-12-08 MED ORDER — TRAMADOL HCL 50 MG PO TABS: 50.0000 mg | ORAL_TABLET | Freq: Four times a day (QID) | ORAL | 0 refills | Status: DC | PRN

## 2019-12-08 MED ORDER — PROPOFOL 10 MG/ML IV BOLUS
INTRAVENOUS | Status: DC | PRN
  Administered 2019-12-08: 150 mg via INTRAVENOUS

## 2019-12-08 MED ORDER — PROPOFOL 1000 MG/100ML IV EMUL
INTRAVENOUS | Status: AC
  Filled 2019-12-08: qty 100

## 2019-12-08 MED ORDER — OXYCODONE HCL 5 MG/5ML PO SOLN: 5.0000 mg | Freq: Once | ORAL | Status: DC | PRN

## 2019-12-08 MED ORDER — SODIUM CHLORIDE 0.9 % IR SOLN
Status: DC | PRN
  Administered 2019-12-08: 1000 mL

## 2019-12-08 MED ORDER — KETOROLAC TROMETHAMINE 30 MG/ML IJ SOLN
30.0000 mg | Freq: Once | INTRAMUSCULAR | Status: AC | PRN
  Administered 2019-12-08: 30 mg via INTRAVENOUS

## 2019-12-08 MED ORDER — SUGAMMADEX SODIUM 200 MG/2ML IV SOLN
INTRAVENOUS | Status: DC | PRN
  Administered 2019-12-08: 200 mg via INTRAVENOUS

## 2019-12-08 MED ORDER — BUPIVACAINE HCL (PF) 0.25 % IJ SOLN
INTRAMUSCULAR | Status: DC | PRN
  Administered 2019-12-08: 8 mL

## 2019-12-08 MED ORDER — PROPOFOL 10 MG/ML IV BOLUS
INTRAVENOUS | Status: AC
  Filled 2019-12-08: qty 20

## 2019-12-08 MED ORDER — MIDAZOLAM HCL 2 MG/2ML IJ SOLN
INTRAMUSCULAR | Status: AC
  Filled 2019-12-08: qty 2

## 2019-12-08 MED ORDER — STERILE WATER FOR IRRIGATION IR SOLN
Status: DC | PRN
  Administered 2019-12-08: 1000 mL

## 2019-12-08 MED ORDER — DEXAMETHASONE SODIUM PHOSPHATE 10 MG/ML IJ SOLN
INTRAMUSCULAR | Status: AC
  Filled 2019-12-08: qty 1

## 2019-12-08 MED ORDER — HYDROMORPHONE HCL 1 MG/ML IJ SOLN
0.2500 mg | INTRAMUSCULAR | Status: DC | PRN
  Administered 2019-12-08 (×2): 0.5 mg via INTRAVENOUS

## 2019-12-08 MED ORDER — BUPIVACAINE HCL (PF) 0.25 % IJ SOLN
INTRAMUSCULAR | Status: AC
  Filled 2019-12-08: qty 30

## 2019-12-08 MED ORDER — LACTATED RINGERS IV SOLN: INTRAVENOUS | Status: DC

## 2019-12-08 MED ORDER — ACETAMINOPHEN 500 MG PO TABS: 1000.0000 mg | ORAL_TABLET | ORAL | Status: AC

## 2019-12-08 MED ORDER — ROCURONIUM BROMIDE 10 MG/ML (PF) SYRINGE
PREFILLED_SYRINGE | INTRAVENOUS | Status: AC
  Filled 2019-12-08: qty 10

## 2019-12-08 MED ORDER — PROPOFOL 500 MG/50ML IV EMUL
INTRAVENOUS | Status: DC | PRN
  Administered 2019-12-08: 25 ug/kg/min via INTRAVENOUS

## 2019-12-08 MED ORDER — EPHEDRINE SULFATE-NACL 50-0.9 MG/10ML-% IV SOSY
PREFILLED_SYRINGE | INTRAVENOUS | Status: DC | PRN
  Administered 2019-12-08 (×2): 5 mg via INTRAVENOUS

## 2019-12-08 MED ORDER — FENTANYL CITRATE (PF) 250 MCG/5ML IJ SOLN
INTRAMUSCULAR | Status: DC | PRN
  Administered 2019-12-08 (×3): 50 ug via INTRAVENOUS

## 2019-12-08 MED ORDER — PROMETHAZINE HCL 25 MG/ML IJ SOLN
6.2500 mg | INTRAMUSCULAR | Status: DC | PRN
  Administered 2019-12-08: 6.25 mg via INTRAVENOUS

## 2019-12-08 MED ORDER — DEXAMETHASONE SODIUM PHOSPHATE 10 MG/ML IJ SOLN
INTRAMUSCULAR | Status: DC | PRN
  Administered 2019-12-08: 10 mg via INTRAVENOUS

## 2019-12-08 MED ORDER — OXYCODONE HCL 5 MG PO TABS: 5.0000 mg | ORAL_TABLET | Freq: Once | ORAL | Status: DC | PRN

## 2019-12-08 MED ORDER — ROCURONIUM BROMIDE 10 MG/ML (PF) SYRINGE
PREFILLED_SYRINGE | INTRAVENOUS | Status: DC | PRN
  Administered 2019-12-08: 60 mg via INTRAVENOUS

## 2019-12-08 SURGICAL SUPPLY — 39 items
APPLICATOR COTTON TIP 6 STRL (MISCELLANEOUS) IMPLANT
APPLICATOR COTTON TIP 6IN STRL (MISCELLANEOUS) ×3
CANISTER SUCT 3000ML PPV (MISCELLANEOUS) ×3 IMPLANT
CHLORAPREP W/TINT 26 (MISCELLANEOUS) ×3 IMPLANT
CLIP VESOLOCK MED LG 6/CT (CLIP) ×3 IMPLANT
COVER SURGICAL LIGHT HANDLE (MISCELLANEOUS) ×3 IMPLANT
COVER TRANSDUCER ULTRASND (DRAPES) ×3 IMPLANT
DERMABOND ADVANCED (GAUZE/BANDAGES/DRESSINGS) ×2
DERMABOND ADVANCED .7 DNX12 (GAUZE/BANDAGES/DRESSINGS) ×1 IMPLANT
ELECT REM PT RETURN 9FT ADLT (ELECTROSURGICAL) ×3
ELECTRODE REM PT RTRN 9FT ADLT (ELECTROSURGICAL) ×1 IMPLANT
GAUZE 4X4 16PLY RFD (DISPOSABLE) ×2 IMPLANT
GLOVE BIO SURGEON STRL SZ7.5 (GLOVE) ×3 IMPLANT
GLOVE BIOGEL PI IND STRL 6.5 (GLOVE) IMPLANT
GLOVE BIOGEL PI INDICATOR 6.5 (GLOVE) ×2
GOWN STRL REUS W/ TWL LRG LVL3 (GOWN DISPOSABLE) ×2 IMPLANT
GOWN STRL REUS W/ TWL XL LVL3 (GOWN DISPOSABLE) ×1 IMPLANT
GOWN STRL REUS W/TWL LRG LVL3 (GOWN DISPOSABLE) ×9
GOWN STRL REUS W/TWL XL LVL3 (GOWN DISPOSABLE) ×3
GRASPER SUT TROCAR 14GX15 (MISCELLANEOUS) ×3 IMPLANT
KIT BASIN OR (CUSTOM PROCEDURE TRAY) ×3 IMPLANT
KIT TURNOVER KIT B (KITS) ×3 IMPLANT
NDL INSUFFLATION 14GA 120MM (NEEDLE) ×1 IMPLANT
NEEDLE INSUFFLATION 14GA 120MM (NEEDLE) ×3 IMPLANT
NS IRRIG 1000ML POUR BTL (IV SOLUTION) ×3 IMPLANT
PAD ARMBOARD 7.5X6 YLW CONV (MISCELLANEOUS) ×3 IMPLANT
POUCH LAPAROSCOPIC INSTRUMENT (MISCELLANEOUS) ×3 IMPLANT
SCISSORS LAP 5X35 DISP (ENDOMECHANICALS) ×3 IMPLANT
SET IRRIG TUBING LAPAROSCOPIC (IRRIGATION / IRRIGATOR) ×3 IMPLANT
SET TUBE SMOKE EVAC HIGH FLOW (TUBING) ×3 IMPLANT
SLEEVE ENDOPATH XCEL 5M (ENDOMECHANICALS) ×5 IMPLANT
SUT MNCRL AB 4-0 PS2 18 (SUTURE) ×3 IMPLANT
SUT VICRYL 0 UR6 27IN ABS (SUTURE) ×2 IMPLANT
TOWEL GREEN STERILE (TOWEL DISPOSABLE) ×3 IMPLANT
TOWEL GREEN STERILE FF (TOWEL DISPOSABLE) ×3 IMPLANT
TRAY LAPAROSCOPIC MC (CUSTOM PROCEDURE TRAY) ×3 IMPLANT
TROCAR XCEL NON-BLD 11X100MML (ENDOMECHANICALS) ×3 IMPLANT
TROCAR XCEL NON-BLD 5MMX100MML (ENDOMECHANICALS) ×3 IMPLANT
WATER STERILE IRR 1000ML POUR (IV SOLUTION) ×3 IMPLANT

## 2019-12-08 NOTE — Transfer of Care (Signed)
Immediate Anesthesia Transfer of Care Note  Patient: Kathleen Stone  Procedure(s) Performed: LAPAROSCOPIC CHOLECYSTECTOMY (N/A Abdomen)  Patient Location: PACU  Anesthesia Type:General  Level of Consciousness: drowsy  Airway & Oxygen Therapy: Patient Spontanous Breathing and Patient connected to face mask oxygen  Post-op Assessment: Report given to RN and Post -op Vital signs reviewed and stable  Post vital signs: Reviewed and stable  Last Vitals:  Vitals Value Taken Time  BP    Temp    Pulse 79 12/08/19 1229  Resp 20 12/08/19 1229  SpO2 96 % 12/08/19 1229  Vitals shown include unvalidated device data.  Last Pain:  Vitals:   12/08/19 0947  TempSrc:   PainSc: 0-No pain      Patients Stated Pain Goal: 6 ([id number])  Complications: No apparent anesthesia complications

## 2019-12-08 NOTE — Anesthesia Postprocedure Evaluation (Signed)
Anesthesia Post Note  Patient: Kathleen Stone  Procedure(s) Performed: LAPAROSCOPIC CHOLECYSTECTOMY (N/A Abdomen)     Patient location during evaluation: PACU Anesthesia Type: General Level of consciousness: awake and alert Pain management: pain level controlled Vital Signs Assessment: post-procedure vital signs reviewed and stable Respiratory status: spontaneous breathing, nonlabored ventilation, respiratory function stable and patient connected to nasal cannula oxygen Cardiovascular status: blood pressure returned to baseline and stable Postop Assessment: no apparent nausea or vomiting Anesthetic complications: no    Last Vitals:  Vitals:   12/08/19 1330 12/08/19 1345  BP: (!) 144/68 (!) 150/70  Pulse: 62 66  Resp: 13 14  Temp: 36.5 C   SpO2: 96% 96%    Last Pain:  Vitals:   12/08/19 1345  TempSrc:   PainSc: Asleep                 Rubi Tooley P Dionne Knoop

## 2019-12-08 NOTE — Interval H&P Note (Signed)
History and Physical Interval Note:  12/08/2019 9:26 AM  Kathleen Stone  has presented today for surgery, with the diagnosis of GALLSTONES.  The various methods of treatment have been discussed with the patient and family. After consideration of risks, benefits and other options for treatment, the patient has consented to  Procedure(s): LAPAROSCOPIC CHOLECYSTECTOMY (N/A) as a surgical intervention.  The patient's history has been reviewed, patient examined, no change in status, stable for surgery.  I have reviewed the patient's chart and labs.  Questions were answered to the patient's satisfaction.     Ralene Ok

## 2019-12-08 NOTE — Op Note (Signed)
12/08/2019  12:14 PM  PATIENT:  Kathleen Stone  54 y.o. female  PRE-OPERATIVE DIAGNOSIS:   Chronic cholecystitis, Cholelithiasis  POST-OPERATIVE DIAGNOSIS:   Chronic cholecysititisCholelithiasis  PROCEDURE:  Procedure(s): LAPAROSCOPIC CHOLECYSTECTOMY (N/A)  SURGEON:  Surgeon(s) and Role:    * Ralene Ok, MD - Primary   ANESTHESIA:   local and general  EBL:  minimal   BLOOD ADMINISTERED:none  DRAINS: none   LOCAL MEDICATIONS USED:  BUPIVICAINE   SPECIMEN:  Source of Specimen:  gallbladder  DISPOSITION OF SPECIMEN:  PATHOLOGY  COUNTS:  YES  TOURNIQUET:  * No tourniquets in log *  DICTATION: .Dragon Dictation The patient was taken to the operating and placed in the supine position with bilateral SCDs in place.  The patient was prepped and draped in the usual sterile fashion. A time out was called and all facts were verified. A pneumoperitoneum was obtained via A Veress needle technique to a pressure of 61mm of mercury.  A 21mm trochar was then placed in the right upper quadrant under visualization, and there were no injuries to any abdominal organs. A 11 mm port was then placed in the umbilical region after infiltrating with local anesthesia under direct visualization. A second and third epigastric port and right lower quadrant port placement under direct visualization, respectively.    The gallbladder was identified and retracted, the peritoneum was then sharply dissected from the gallbladder and this dissection was carried down to Calot's triangle. The gallbladder was identified and stripped away circumferentially and seen going into the gallbladder 360, the critical angle was obtained.  2 clips were placed proximally one distally and the cystic duct transected. The cystic artery was identified and 2 clips placed proximally and one distally and transected.  We then proceeded to remove the gallbladder off the hepatic fossa with Bovie cautery. A retrieval bag was then  placed in the abdomen and gallbladder placed in the bag. The hepatic fossa was then reexamined and hemostasis was achieved with Bovie cautery and was excellent at the end of the case.   The subhepatic fossa and perihepatic fossa was then irrigated until the effluent was clear.  The gallbladder and bag were removed from the abdominal cavity. The 11 mm trocar fascia was reapproximated with the Endo Close #1 Vicryl x3.  The pneumoperitoneum was evacuated and all trochars removed under direct visulalization.  The skin was then closed with 4-0 Monocryl and the skin dressed with Dermabond.    The patient was awaken from general anesthesia and taken to the recovery room in stable condition.   PLAN OF CARE: Discharge to home after PACU  PATIENT DISPOSITION:  PACU - hemodynamically stable.   Delay start of Pharmacological VTE agent (>24hrs) due to surgical blood loss or risk of bleeding: not applicable

## 2019-12-08 NOTE — Anesthesia Procedure Notes (Signed)
Procedure Name: Intubation Date/Time: 12/08/2019 11:25 AM Performed by: Leonor Liv, CRNA Pre-anesthesia Checklist: Patient identified, Emergency Drugs available, Suction available and Patient being monitored Patient Re-evaluated:Patient Re-evaluated prior to induction Oxygen Delivery Method: Circle System Utilized Preoxygenation: Pre-oxygenation with 100% oxygen Induction Type: IV induction Ventilation: Mask ventilation without difficulty Laryngoscope Size: Miller and 3 Grade View: Grade I Tube type: Oral Tube size: 7.0 mm Number of attempts: 1 Airway Equipment and Method: Stylet Placement Confirmation: ETT inserted through vocal cords under direct vision,  positive ETCO2 and breath sounds checked- equal and bilateral Secured at: 21 cm Tube secured with: Tape Dental Injury: Teeth and Oropharynx as per pre-operative assessment  Comments: Performed by Martinique Ingram SRNA

## 2019-12-08 NOTE — Discharge Instructions (Signed)
[facility] ______[facility] °LAPAROSCOPIC SURGERY: POST OP INSTRUCTIONS °Always review your discharge instruction sheet given to you by the facility where your surgery was performed. °IF YOU HAVE DISABILITY OR FAMILY LEAVE FORMS, YOU MUST BRING THEM TO THE OFFICE FOR PROCESSING.   °DO NOT GIVE THEM TO YOUR DOCTOR. ° °1. A prescription for pain medication may be given to you upon discharge.  Take your pain medication as prescribed, if needed.  If narcotic pain medicine is not needed, then you may take acetaminophen (Tylenol) or ibuprofen (Advil) as needed. °2. Take your usually prescribed medications unless otherwise directed. °3. If you need a refill on your pain medication, please contact your pharmacy.  They will contact our office to request authorization. Prescriptions will not be filled after 5pm or on week-ends. °4. You should follow a light diet the first few days after arrival home, such as soup and crackers, etc.  Be sure to include lots of fluids daily. °5. Most patients will experience some swelling and bruising in the area of the incisions.  Ice packs will help.  Swelling and bruising can take several days to resolve.  °6. It is common to experience some constipation if taking pain medication after surgery.  Increasing fluid intake and taking a stool softener (such as Colace) will usually help or prevent this problem from occurring.  A mild laxative (Milk of Magnesia or Miralax) should be taken according to package instructions if there are no bowel movements after 48 hours. °7. Unless discharge instructions indicate otherwise, you may remove your bandages 24-48 hours after surgery, and you may shower at that time.  You may have steri-strips (small skin tapes) in place directly over the incision.  These strips should be left on the skin for 7-10 days.  If your surgeon used skin glue on the incision, you may shower in 24 hours.  The glue will flake off over the next 2-3 weeks.  Any sutures or  staples will be removed at the office during your follow-up visit. °8. ACTIVITIES:  You may resume regular (light) daily activities beginning the next day--such as daily self-care, walking, climbing stairs--gradually increasing activities as tolerated.  You may have sexual intercourse when it is comfortable.  Refrain from any heavy lifting or straining until approved by your doctor. °a. You may drive when you are no longer taking prescription pain medication, you can comfortably wear a seatbelt, and you can safely maneuver your car and apply brakes. °b. RETURN TO WORK:  __________________________________________________________ °9. You should see your doctor in the office for a follow-up appointment approximately 2-3 weeks after your surgery.  Make sure that you call for this appointment within a day or two after you arrive home to insure a convenient appointment time. °10. OTHER INSTRUCTIONS: __________________________________________________________________________________________________________________________ __________________________________________________________________________________________________________________________ °WHEN TO CALL YOUR DOCTOR: °1. Fever over 101.0 °2. Inability to urinate °3. Continued bleeding from incision. °4. Increased pain, redness, or drainage from the incision. °5. Increasing abdominal pain ° °The clinic staff is available to answer your questions during regular business hours.  Please don’t hesitate to call and ask to speak to one of the nurses for clinical concerns.  If you have a medical emergency, go to the nearest emergency room or call 911.  A surgeon from [facility] is always on call at the hospital. °[address], [address], [place], [place]  [address] ? [address] [address], [place], [place]   [address] °[phone] ? [phone] ? FAX [phone] °Web site:   [email or web address] °

## 2019-12-09 LAB — SURGICAL PATHOLOGY

## 2019-12-22 ENCOUNTER — Other Ambulatory Visit: Payer: Self-pay | Admitting: Internal Medicine

## 2019-12-22 DIAGNOSIS — I10 Essential (primary) hypertension: Secondary | ICD-10-CM

## 2019-12-22 NOTE — Telephone Encounter (Signed)
Please schedule follow up visit with PCP

## 2019-12-27 ENCOUNTER — Other Ambulatory Visit: Payer: Self-pay | Admitting: Internal Medicine

## 2019-12-27 DIAGNOSIS — I10 Essential (primary) hypertension: Secondary | ICD-10-CM

## 2020-01-01 DIAGNOSIS — M67911 Unspecified disorder of synovium and tendon, right shoulder: Secondary | ICD-10-CM | POA: Diagnosis not present

## 2020-01-14 ENCOUNTER — Ambulatory Visit: Payer: BC Managed Care – PPO | Attending: Internal Medicine

## 2020-01-14 DIAGNOSIS — Z23 Encounter for immunization: Secondary | ICD-10-CM

## 2020-01-14 NOTE — Progress Notes (Signed)
   Covid-19 Vaccination Clinic  Name:  Kathleen Stone    MRN: [id number] DOB: [birth year]  01/14/2020  Kathleen Stone was observed post Covid-19 immunization for 30 minutes based on pre-vaccination screening without incident. She was provided with Vaccine Information Sheet and instruction to access the V-Safe system.   Kathleen Stone was instructed to call 911 with any severe reactions post vaccine: Marland Kitchen Difficulty breathing  . Swelling of face and throat  . A fast heartbeat  . A bad rash all over body  . Dizziness and weakness   Immunizations Administered    Name Date Dose VIS Date Route   Pfizer COVID-19 Vaccine 01/14/2020  3:30 PM 0.3 mL 11/11/2018 Intramuscular   Manufacturer: [place]   Lot: [id number]   [place]: [id number]

## 2020-01-27 ENCOUNTER — Ambulatory Visit: Payer: BC Managed Care – PPO | Admitting: Internal Medicine

## 2020-01-27 ENCOUNTER — Encounter: Payer: Self-pay | Admitting: Internal Medicine

## 2020-01-27 VITALS — BP 147/70 | HR 86 | Temp 98.0°F | Ht 66.0 in | Wt 267.2 lb

## 2020-01-27 DIAGNOSIS — E785 Hyperlipidemia, unspecified: Secondary | ICD-10-CM | POA: Diagnosis not present

## 2020-01-27 DIAGNOSIS — M7501 Adhesive capsulitis of right shoulder: Secondary | ICD-10-CM

## 2020-01-27 DIAGNOSIS — I10 Essential (primary) hypertension: Secondary | ICD-10-CM | POA: Diagnosis not present

## 2020-01-27 HISTORY — DX: Adhesive capsulitis of right shoulder: M75.01

## 2020-01-27 MED ORDER — ROSUVASTATIN CALCIUM 40 MG PO TABS: 40.0000 mg | ORAL_TABLET | Freq: Every day | ORAL | 3 refills | Status: DC

## 2020-01-27 NOTE — Assessment & Plan Note (Signed)
Presents w/ hx of HLD and prescribed atorvastatin in the past. Mentions difficulty with daily adherence due to discomfort associated with pill swallowing and fear of myalgias. Discussed trying alternative therapy with rosuvastatin. Kathleen Stone is agreeable.  - Start rousvastatin 40mg  daily

## 2020-01-27 NOTE — Assessment & Plan Note (Signed)
Kathleen Stone is a 54 yo F w/ PMH of tobacco use, djd, radiculopathy, htn, and hld presenting with complaint of R shoulder pain. She mentions endorsing shoulder pain ever since she got her flu injection in 09/2019 and states her R shoulder range of motion has been significantly limited. She denies any significant radiation, numbness, tingling or focal weakness. She denies any fevers, chills, nausea, vomiting. She mentions following up with sports medicine and receiving steroid injection in the shoulder and having relief for only 1-2 days.  A/P: On physical exam, severe limited range of motion with some tenderness to deep palpation on anterior aspect. No warmth or edema. Consistent with adhesive capsulitis. Will get ultrasound to r/o severe tendon rupture/disease considering severity of limited range of motion but likely will need repeat steroid injection for treatment.  - Shoulder joint ultrasound - F/u with sports med - NSAIDs prn for pain

## 2020-01-27 NOTE — Assessment & Plan Note (Signed)
BP Readings from Last 3 Encounters:  01/27/20 (!) 147/70  12/08/19 (!) 150/70  10/30/19 140/65   BP above goal. Previously on losartan and metoprolol and losartan dose elevated at last visit. She mentions that her elevated blood pressure is due to anxiety and stress regarding encounters with healthcare providers and her blood pressure is well controlled at home. Mentions that her sister has a blood pressure cuff and she mentions 'normal' blood pressure at home. She states she has difficulty with daily adherence to her medications due to discomfort associated with swallowing pills. Discussed importance of adequate blood pressure control and risks of uncontrolled hypertension.  - Blood pressure log provided with instruction to record home bp readings and bring to next visit with PCP - Emphasized importance of daily adherence to bp meds - C/w losartan 50mg  daily, metoprolol 25mg  BID

## 2020-01-27 NOTE — Progress Notes (Signed)
CC: Shoulder pain  HPI: [name] is a 54 y.o. with PMH listed below presenting with complaint of shoulder pain. Please see problem based assessment and plan for further details.  Past Medical History:  Diagnosis Date  . Chronic lower back pain    Negative MRI of hips bilaterally, neg CT scan of A and P; MRI of T and L spine-> very mild deg changes from T4-5 and T7-8, cervical spondylotic changes at C5-6, very mild disc bulge at L2-3 without spinal stenosis, nl ESR, ANA, RF, HIV, TSH, and B12  . DJD (degenerative joint disease) of cervical spine   . Family history of adverse reaction to anesthesia    "sisters PONV"  . Galactorrhea of both breasts 2009   R. sided w/ benign papilloma excised in 4/09. L sided in 5/09  . Gallstones   . Hypertension   . Migraine    "monthly" (11/15/2016)  . Neuropathy    Thought to be 2/2 nerve compression in the C-spine  . Obesity   . Personal history of adenomatous colonic polyp    09/2007 - diminutive adenoma Kathleen Stone)  . PONV (postoperative nausea and vomiting)   . Tobacco abuse    Review of Systems: Review of Systems  Constitutional: Negative for chills, fever and malaise/fatigue.  Eyes: Negative for blurred vision.  Respiratory: Negative for shortness of breath.   Cardiovascular: Negative for chest pain, palpitations and leg swelling.  Gastrointestinal: Negative for constipation, diarrhea, nausea and vomiting.  Genitourinary: Negative for dysuria, frequency and urgency.  Musculoskeletal: Positive for joint pain and myalgias.  Neurological: Negative for dizziness.  All other systems reviewed and are negative.    Physical Exam: Vitals:   01/27/20 1406  BP: (!) 147/70  Pulse: 86  Temp: 98 F (36.7 C)  TempSrc: Oral  SpO2: 98%  Weight: 267 lb 3.2 oz (121.2 kg)  Height: '5\' 6"'  (1.676 m)   Physical Exam  Constitutional: She is oriented to person, place, and time. She appears well-developed and well-nourished. No distress.    Cardiovascular: Normal rate, regular rhythm, normal heart sounds and intact distal pulses.  No murmur heard. Respiratory: Effort normal and breath sounds normal. She has no wheezes. She has no rales.  GI: Soft. Bowel sounds are normal. She exhibits no distension. There is no abdominal tenderness.  Musculoskeletal:        General: Tenderness (Severe limited range of motion of R shoulder. Abduction limited to 45 degrees on active motion, 90 degrees on passive motion. Flexion, extension limited. Internal rotation intact. Unable to perform empty can test) present.  Neurological: She is alert and oriented to person, place, and time.  Bilateral upper extremity strength equal and intact  Skin: Skin is warm and dry.     Assessment & Plan:   Essential hypertension BP Readings from Last 3 Encounters:  01/27/20 (!) 147/70  12/08/19 (!) 150/70  10/30/19 140/65   BP above goal. Previously on losartan and metoprolol and losartan dose elevated at last visit. She mentions that her elevated blood pressure is due to anxiety and stress regarding encounters with healthcare providers and her blood pressure is well controlled at home. Mentions that her sister has a blood pressure cuff and she mentions 'normal' blood pressure at home. She states she has difficulty with daily adherence to her medications due to discomfort associated with swallowing pills. Discussed importance of adequate blood pressure control and risks of uncontrolled hypertension.  - Blood pressure log provided with instruction to record home  bp readings and bring to next visit with PCP - Emphasized importance of daily adherence to bp meds - C/w losartan 51m daily, metoprolol 240mBID  Adhesive capsulitis of right shoulder [name] a [age]o F w/ PMH of tobacco use, djd, radiculopathy, htn, and hld presenting with complaint of R shoulder pain. She mentions endorsing shoulder pain ever since she got her flu injection in 09/2019 and states her  R shoulder range of motion has been significantly limited. She denies any significant radiation, numbness, tingling or focal weakness. She denies any fevers, chills, nausea, vomiting. She mentions following up with sports medicine and receiving steroid injection in the shoulder and having relief for only 1-2 days.  A/P: On physical exam, severe limited range of motion with some tenderness to deep palpation on anterior aspect. No warmth or edema. Consistent with adhesive capsulitis. Will get ultrasound to r/o severe tendon rupture/disease considering severity of limited range of motion but likely will need repeat steroid injection for treatment.  - Shoulder joint ultrasound - F/u with sports med - NSAIDs prn for pain  Hyperlipidemia Presents w/ hx of HLD and prescribed atorvastatin in the past. Mentions difficulty with daily adherence due to discomfort associated with pill swallowing and fear of myalgias. Discussed trying alternative therapy with rosuvastatin. Kathleen Stone is agreeable.  - Start rousvastatin 4062maily    Patient discussed with Dr. NarDareen Piano-JosGilberto Better[facility] Pager: [phone]

## 2020-01-27 NOTE — Patient Instructions (Addendum)
Thank you for allowing [place] to provide your care today. Today we discussed your shoulder pain    I have ordered no labs for you. I will call if any are abnormal.    Today we made no changes to your medications.    Please follow-up with your pcp.    Should you have any questions or concerns please call the internal medicine clinic at [phone].     Adhesive Capsulitis  Adhesive capsulitis, also called frozen shoulder, causes the shoulder to become stiff and painful to move. This condition happens when there is inflammation of the tendons and ligaments that surround the shoulder joint (shoulder capsule). What are the causes? This condition may be caused by:  An injury to your shoulder joint.  Straining your shoulder.  Not moving your shoulder for a period of time. This can happen if your arm was injured or in a sling.  Long-standing conditions, such as: ? Diabetes. ? Thyroid problems. ? Heart disease. ? Stroke. ? Rheumatoid arthritis. ? Lung disease. In some cases, the cause is not known. What increases the risk? You are more likely to develop this condition if you are:  A woman.  Older than 55 years of age. What are the signs or symptoms? Symptoms of this condition include:  Pain in your shoulder when you move your arm. There may also be pain when parts of your shoulder are touched. The pain may be worse at night or when you are resting.  A sore or aching shoulder.  The inability to move your shoulder normally.  Muscle spasms. How is this diagnosed? This condition is diagnosed with a physical exam and imaging tests, such as an X-ray or MRI. How is this treated? This condition may be treated with:  Treatment of the underlying cause or condition.  Medicine. Medicine may be given to relieve pain, inflammation, or muscle spasms.  Steroid injections into the shoulder joint.  Physical therapy. This involves performing exercises to get the shoulder moving  again.  Acupuncture. This is a type of treatment that involves stimulating specific points on your body by inserting thin needles through your skin.  Shoulder manipulation. This is a procedure to move the shoulder into another position. It is done after you are given a medicine to make you fall asleep (general anesthetic). The joint may also be injected with salt water at high pressure to break down scarring.  Surgery. This may be done in severe cases when other treatments have failed. Although most people recover completely from adhesive capsulitis, some may not regain full shoulder movement. Follow these instructions at home: Managing pain, stiffness, and swelling      If directed, put ice on the injured area: ? Put ice in a plastic bag. ? Place a towel between your skin and the bag. ? Leave the ice on for 20 minutes, 2-3 times per day.  If directed, apply heat to the affected area before you exercise. Use the heat source that your health care provider recommends, such as a moist heat pack or a heating pad. ? Place a towel between your skin and the heat source. ? Leave the heat on for 20-30 minutes. ? Remove the heat if your skin turns bright red. This is especially important if you are unable to feel pain, heat, or cold. You may have a greater risk of getting burned. General instructions  Take over-the-counter and prescription medicines only as told by your health care provider.  If you are being treated  with physical therapy, follow instructions from your physical therapist.  Avoid exercises that put a lot of demand on your shoulder, such as throwing. These exercises can make pain worse.  Keep all follow-up visits as told by your health care provider. This is important. Contact a health care provider if:  You develop new symptoms.  Your symptoms get worse. Summary  Adhesive capsulitis, also called frozen shoulder, causes the shoulder to become stiff and painful to  move.  You are more likely to have this condition if you are a woman and over age 24.  It is treated with physical therapy, medicines, and sometimes surgery. This information is not intended to replace advice given to you by your health care provider. Make sure you discuss any questions you have with your health care provider. Document Revised: 02/07/2018 Document Reviewed: 02/07/2018 Elsevier Patient Education  [place].

## 2020-02-01 NOTE — Progress Notes (Signed)
Internal Medicine Clinic Attending ° °Case discussed with Dr. Lee at the time of the visit.  We reviewed the resident’s history and exam and pertinent patient test results.  I agree with the assessment, diagnosis, and plan of care documented in the resident’s note.  °

## 2020-02-08 ENCOUNTER — Ambulatory Visit: Payer: BC Managed Care – PPO | Attending: Internal Medicine

## 2020-02-08 ENCOUNTER — Encounter: Payer: BC Managed Care – PPO | Admitting: Internal Medicine

## 2020-02-08 ENCOUNTER — Telehealth: Payer: Self-pay | Admitting: *Deleted

## 2020-02-08 DIAGNOSIS — M7501 Adhesive capsulitis of right shoulder: Secondary | ICD-10-CM

## 2020-02-08 DIAGNOSIS — Z23 Encounter for immunization: Secondary | ICD-10-CM

## 2020-02-08 NOTE — Progress Notes (Signed)
   Covid-19 Vaccination Clinic  Name:  Kathleen Stone    MRN: [id number] DOB: 05-21-1966  02/08/2020  [name] was observed post Covid-19 immunization for 15 minutes without incident. She was provided with Vaccine Information Sheet and instruction to access the V-Safe system.   [name] was instructed to call 911 with any severe reactions post vaccine: Marland Kitchen Difficulty breathing  . Swelling of face and throat  . A fast heartbeat  . A bad rash all over body  . Dizziness and weakness   Immunizations Administered    Name Date Dose VIS Date Route   Pfizer COVID-19 Vaccine 02/08/2020  4:24 PM 0.3 mL 11/11/2018 Intramuscular   Manufacturer: [place]   Lot: [id number]   [place]: [id number]

## 2020-02-08 NOTE — Telephone Encounter (Signed)
Pt calls and states she is having a horrible time with her arm/ shoulder, wants to know if you can prescribe something, has not heard about appts for sports med or U/S yet.

## 2020-02-09 NOTE — Telephone Encounter (Signed)
FYI:  Patient's [place] is to be sch @ [facility] .  Per Danae Chen @ GI [place] must be performed @ their site due not being able to be performed @  the [facility].  GI Sch is booked for this month.  Pt to be called and Sch as soon as the June Sch is posted.  Patient was made aware of the Sch delay.

## 2020-02-11 MED ORDER — MELOXICAM 7.5 MG PO TABS: 7.5000 mg | ORAL_TABLET | Freq: Every day | ORAL | 0 refills | Status: AC | PRN

## 2020-02-11 NOTE — Addendum Note (Signed)
Addended by: Mosetta Anis on: 02/11/2020 05:30 PM   Modules accepted: Orders

## 2020-02-11 NOTE — Telephone Encounter (Signed)
Discussed with [name] regarding her shoulder pain. States she has upcoming appointment with GI imaging for the ultrasound. Discussed making follow-up appointment with her Sports Med clinic (unable to recall name of clinic). Meanwhile using NSAIDs as needed. Kathleen Stone expressed understanding. Mobic script sent to pharmacy.

## 2020-02-22 ENCOUNTER — Emergency Department (HOSPITAL_COMMUNITY)
Admission: EM | Admit: 2020-02-22 | Discharge: 2020-02-22 | Discharge status: Against Medical Advice | Payer: BC Managed Care – PPO

## 2020-02-22 NOTE — ED Notes (Signed)
Pt stated that she will come back in the morning.

## 2020-02-24 ENCOUNTER — Inpatient Hospital Stay (HOSPITAL_COMMUNITY)
Admission: RE | Admit: 2020-02-24 | Discharge: 2020-02-29 | DRG: 055 | Disposition: A | Discharge status: Home / Self Care | Payer: BC Managed Care – PPO | Attending: Internal Medicine | Admitting: Internal Medicine

## 2020-02-24 ENCOUNTER — Other Ambulatory Visit: Payer: Self-pay

## 2020-02-24 ENCOUNTER — Emergency Department (HOSPITAL_COMMUNITY): Payer: BC Managed Care – PPO

## 2020-02-24 ENCOUNTER — Ambulatory Visit: Payer: BC Managed Care – PPO | Admitting: Internal Medicine

## 2020-02-24 DIAGNOSIS — I10 Essential (primary) hypertension: Secondary | ICD-10-CM | POA: Diagnosis present

## 2020-02-24 DIAGNOSIS — Z833 Family history of diabetes mellitus: Secondary | ICD-10-CM | POA: Diagnosis not present

## 2020-02-24 DIAGNOSIS — R3129 Other microscopic hematuria: Secondary | ICD-10-CM | POA: Diagnosis present

## 2020-02-24 DIAGNOSIS — F329 Major depressive disorder, single episode, unspecified: Secondary | ICD-10-CM | POA: Diagnosis present

## 2020-02-24 DIAGNOSIS — Z803 Family history of malignant neoplasm of breast: Secondary | ICD-10-CM

## 2020-02-24 DIAGNOSIS — R519 Headache, unspecified: Secondary | ICD-10-CM | POA: Insufficient documentation

## 2020-02-24 DIAGNOSIS — L298 Other pruritus: Secondary | ICD-10-CM | POA: Diagnosis not present

## 2020-02-24 DIAGNOSIS — N179 Acute kidney failure, unspecified: Secondary | ICD-10-CM | POA: Diagnosis present

## 2020-02-24 DIAGNOSIS — G939 Disorder of brain, unspecified: Secondary | ICD-10-CM | POA: Diagnosis not present

## 2020-02-24 DIAGNOSIS — G9389 Other specified disorders of brain: Secondary | ICD-10-CM

## 2020-02-24 DIAGNOSIS — Z800 Family history of malignant neoplasm of digestive organs: Secondary | ICD-10-CM | POA: Diagnosis not present

## 2020-02-24 DIAGNOSIS — Z79899 Other long term (current) drug therapy: Secondary | ICD-10-CM | POA: Diagnosis not present

## 2020-02-24 DIAGNOSIS — G43909 Migraine, unspecified, not intractable, without status migrainosus: Secondary | ICD-10-CM | POA: Diagnosis not present

## 2020-02-24 DIAGNOSIS — D496 Neoplasm of unspecified behavior of brain: Secondary | ICD-10-CM | POA: Diagnosis not present

## 2020-02-24 DIAGNOSIS — G4452 New daily persistent headache (NDPH): Secondary | ICD-10-CM

## 2020-02-24 DIAGNOSIS — Z91041 Radiographic dye allergy status: Secondary | ICD-10-CM

## 2020-02-24 DIAGNOSIS — D331 Benign neoplasm of brain, infratentorial: Secondary | ICD-10-CM | POA: Diagnosis not present

## 2020-02-24 DIAGNOSIS — E876 Hypokalemia: Secondary | ICD-10-CM | POA: Diagnosis not present

## 2020-02-24 DIAGNOSIS — F17210 Nicotine dependence, cigarettes, uncomplicated: Secondary | ICD-10-CM | POA: Diagnosis present

## 2020-02-24 DIAGNOSIS — Z86010 Personal history of colonic polyps: Secondary | ICD-10-CM

## 2020-02-24 DIAGNOSIS — E669 Obesity, unspecified: Secondary | ICD-10-CM | POA: Diagnosis present

## 2020-02-24 DIAGNOSIS — Z20822 Contact with and (suspected) exposure to covid-19: Secondary | ICD-10-CM | POA: Diagnosis present

## 2020-02-24 DIAGNOSIS — Z6841 Body Mass Index (BMI) 40.0 and over, adult: Secondary | ICD-10-CM | POA: Diagnosis not present

## 2020-02-24 DIAGNOSIS — Z801 Family history of malignant neoplasm of trachea, bronchus and lung: Secondary | ICD-10-CM

## 2020-02-24 DIAGNOSIS — L299 Pruritus, unspecified: Secondary | ICD-10-CM | POA: Diagnosis not present

## 2020-02-24 DIAGNOSIS — N200 Calculus of kidney: Secondary | ICD-10-CM | POA: Diagnosis not present

## 2020-02-24 DIAGNOSIS — C719 Malignant neoplasm of brain, unspecified: Secondary | ICD-10-CM | POA: Diagnosis not present

## 2020-02-24 DIAGNOSIS — D259 Leiomyoma of uterus, unspecified: Secondary | ICD-10-CM | POA: Diagnosis not present

## 2020-02-24 DIAGNOSIS — M67911 Unspecified disorder of synovium and tendon, right shoulder: Secondary | ICD-10-CM | POA: Diagnosis not present

## 2020-02-24 DIAGNOSIS — R509 Fever, unspecified: Secondary | ICD-10-CM | POA: Diagnosis not present

## 2020-02-24 DIAGNOSIS — Z8249 Family history of ischemic heart disease and other diseases of the circulatory system: Secondary | ICD-10-CM

## 2020-02-24 DIAGNOSIS — Z791 Long term (current) use of non-steroidal anti-inflammatories (NSAID): Secondary | ICD-10-CM | POA: Diagnosis not present

## 2020-02-24 DIAGNOSIS — E785 Hyperlipidemia, unspecified: Secondary | ICD-10-CM | POA: Diagnosis present

## 2020-02-24 HISTORY — DX: Other microscopic hematuria: R31.29

## 2020-02-24 LAB — CBC WITH DIFFERENTIAL/PLATELET
Abs Immature Granulocytes: 0.03 10*3/uL (ref 0.00–0.07)
Basophils Absolute: 0.1 10*3/uL (ref 0.0–0.1)
Basophils Relative: 1 %
Eosinophils Absolute: 0 10*3/uL (ref 0.0–0.5)
Eosinophils Relative: 0 %
HCT: 41.4 % (ref 36.0–46.0)
Hemoglobin: 13.2 g/dL (ref 12.0–15.0)
Immature Granulocytes: 1 %
Lymphocytes Relative: 32 %
Lymphs Abs: 1.9 10*3/uL (ref 0.7–4.0)
MCH: 28.3 pg (ref 26.0–34.0)
MCHC: 31.9 g/dL (ref 30.0–36.0)
MCV: 88.8 fL (ref 80.0–100.0)
Monocytes Absolute: 0.5 10*3/uL (ref 0.1–1.0)
Monocytes Relative: 8 %
Neutro Abs: 3.6 10*3/uL (ref 1.7–7.7)
Neutrophils Relative %: 58 %
Platelets: 136 10*3/uL — ABNORMAL LOW (ref 150–400)
RBC: 4.66 MIL/uL (ref 3.87–5.11)
RDW: 14.9 % (ref 11.5–15.5)
WBC: 6.1 10*3/uL (ref 4.0–10.5)
nRBC: 0 % (ref 0.0–0.2)

## 2020-02-24 LAB — URINALYSIS, ROUTINE W REFLEX MICROSCOPIC
Bilirubin Urine: NEGATIVE
Glucose, UA: NEGATIVE mg/dL
Ketones, ur: NEGATIVE mg/dL
Leukocytes,Ua: NEGATIVE
Nitrite: NEGATIVE
Protein, ur: 30 mg/dL — AB
Specific Gravity, Urine: 1.029 (ref 1.005–1.030)
pH: 5 (ref 5.0–8.0)

## 2020-02-24 LAB — COMPREHENSIVE METABOLIC PANEL
ALT: 33 U/L (ref 0–44)
AST: 36 U/L (ref 15–41)
Albumin: 3.2 g/dL — ABNORMAL LOW (ref 3.5–5.0)
Alkaline Phosphatase: 86 U/L (ref 38–126)
Anion gap: 10 (ref 5–15)
BUN: 13 mg/dL (ref 6–20)
CO2: 19 mmol/L — ABNORMAL LOW (ref 22–32)
Calcium: 8.7 mg/dL — ABNORMAL LOW (ref 8.9–10.3)
Chloride: 109 mmol/L (ref 98–111)
Creatinine, Ser: 1.27 mg/dL — ABNORMAL HIGH (ref 0.44–1.00)
GFR calc Af Amer: 55 mL/min — ABNORMAL LOW (ref >60)
GFR calc non Af Amer: 47 mL/min — ABNORMAL LOW (ref >60)
Glucose, Bld: 89 mg/dL (ref 70–99)
Potassium: 3.5 mmol/L (ref 3.5–5.1)
Sodium: 138 mmol/L (ref 135–145)
Total Bilirubin: 0.8 mg/dL (ref 0.3–1.2)
Total Protein: 6.9 g/dL (ref 6.5–8.1)

## 2020-02-24 LAB — LACTIC ACID, PLASMA: Lactic Acid, Venous: 1 mmol/L (ref 0.5–1.9)

## 2020-02-24 MED ORDER — GADOBUTROL 1 MMOL/ML IV SOLN
10.0000 mL | Freq: Once | INTRAVENOUS | Status: AC | PRN
  Administered 2020-02-24: 10 mL via INTRAVENOUS

## 2020-02-24 MED ORDER — LACTATED RINGERS IV SOLN: INTRAVENOUS | Status: DC

## 2020-02-24 MED ORDER — LACTATED RINGERS IV BOLUS: 1000.0000 mL | Freq: Once | INTRAVENOUS | Status: DC

## 2020-02-24 MED ORDER — VANCOMYCIN HCL 1500 MG/300ML IV SOLN: 1500.0000 mg | INTRAVENOUS | Status: DC

## 2020-02-24 MED ORDER — METOPROLOL TARTRATE 25 MG PO TABS
25.0000 mg | ORAL_TABLET | Freq: Two times a day (BID) | ORAL | Status: DC
  Administered 2020-02-25 – 2020-02-29 (×9): 25 mg via ORAL
  Filled 2020-02-24 (×10): qty 1

## 2020-02-24 MED ORDER — METOCLOPRAMIDE HCL 5 MG/ML IJ SOLN
10.0000 mg | Freq: Once | INTRAMUSCULAR | Status: AC
  Administered 2020-02-24: 10 mg via INTRAVENOUS
  Filled 2020-02-24: qty 2

## 2020-02-24 MED ORDER — METRONIDAZOLE IN NACL 5-0.79 MG/ML-% IV SOLN
500.0000 mg | Freq: Three times a day (TID) | INTRAVENOUS | Status: DC
  Administered 2020-02-24 – 2020-02-25 (×2): 500 mg via INTRAVENOUS
  Filled 2020-02-24 (×2): qty 100

## 2020-02-24 MED ORDER — DIPHENHYDRAMINE HCL 50 MG/ML IJ SOLN
25.0000 mg | Freq: Once | INTRAMUSCULAR | Status: AC
  Administered 2020-02-24: 25 mg via INTRAVENOUS
  Filled 2020-02-24: qty 1

## 2020-02-24 MED ORDER — SODIUM CHLORIDE 0.9 % IV SOLN
2.0000 g | Freq: Two times a day (BID) | INTRAVENOUS | Status: DC
  Administered 2020-02-24: 2 g via INTRAVENOUS
  Filled 2020-02-24: qty 20

## 2020-02-24 MED ORDER — LACTATED RINGERS IV BOLUS
1000.0000 mL | Freq: Once | INTRAVENOUS | Status: AC
  Administered 2020-02-24: 1000 mL via INTRAVENOUS

## 2020-02-24 MED ORDER — VANCOMYCIN HCL 2000 MG/400ML IV SOLN
2000.0000 mg | Freq: Once | INTRAVENOUS | Status: AC
  Administered 2020-02-24: 2000 mg via INTRAVENOUS
  Filled 2020-02-24: qty 400

## 2020-02-24 MED ORDER — ROSUVASTATIN CALCIUM 20 MG PO TABS
40.0000 mg | ORAL_TABLET | Freq: Every day | ORAL | Status: DC
  Administered 2020-02-25 – 2020-02-29 (×5): 40 mg via ORAL
  Filled 2020-02-24 (×5): qty 2

## 2020-02-24 NOTE — Progress Notes (Signed)
Internal Medicine Clinic Attending  I saw and evaluated the patient.  I personally confirmed the key portions of the history and exam documented by Dr. Sherry Ruffing and I reviewed pertinent patient test results.  The assessment, diagnosis, and plan were formulated together and I agree with the documentation in the resident's note.  54 year old person came to clinic today for evaluation of new onset severe headache for 4 days, associated with nausea. No significant history of headaches before. Found to be febrile to 101.3 on recheck. No respiratory symptoms, no dysuria, no other source of infection is evident. She has neck pain with flexion, but otherwise appears non-toxic and has a normal neurologic exam. We are going to send her to the ED for expedited evaluation to rule out meningitis and encephalitis.

## 2020-02-24 NOTE — Progress Notes (Signed)
Pharmacy Antibiotic Note  Kathleen Stone is a 54 y.o. female admitted on 02/24/2020 with headache.  Pharmacy has been consulted for vancomycin dosing. Pt with Tmax 100.8 and WBC is WNL. SCr is slightly elevated at 1.27. Also started on ceftriaxone and flagyl.   Plan: Vancomcyin 2g IV x 1 then 1500mg  IV Q24H F/u renal fxn, C&S, clinical status and trough at SS    Temp (24hrs), Avg:99.6 F (37.6 C), Min:99 F (37.2 C), Max:100.8 F (38.2 C)  Recent Labs  Lab 02/24/20 1650  WBC 6.1  CREATININE 1.27*  LATICACIDVEN 1.0    Estimated Creatinine Clearance: 66.1 mL/min (A) (by C-G formula based on SCr of 1.27 mg/dL (H)).    Allergies  Allergen Reactions  . Iohexol Hives and Itching    Benadryl 50mg  PO one hour before iodinated contrast studies.  jkl    Antimicrobials this admission: Vanc 6/9>> CTX 6/9>> Flagyl 6/9>>  Dose adjustments this admission: N/A  Microbiology results: Pending  Thank you for allowing pharmacy to be a part of this patient's care.  [name], Rande Lawman 02/24/2020 9:47 PM

## 2020-02-24 NOTE — ED Notes (Signed)
Pt transported to MRI 

## 2020-02-24 NOTE — Assessment & Plan Note (Addendum)
Kathleen Stone is a 54 y.o. with a history of hypertension, hyperlipidemia, depression, tobacco use, and lumbar radiculopathy presenting with a new onset headache for the past 4 days.  She reports never having a headache like this before.  This started about 4 days ago when she was driving back to [place] from the room, she reports that she started having a pounding headache on the frontal aspect, associated with some blurry vision and some mildly runny eyes.  No radiation of the headache.  Started as an 8 out of 10 and progressed to 10 out of 10.  She denies any other associated symptoms such as nausea, vomiting, chest pain, shortness of breath, weakness, numbness, photophobia, phonophobia, cough, sinus pain, or other symptoms.  She has tried Advil and Tylenol, reports that this did not help.  She reports smoking about half a pack per day, has been cutting back past couple of days.  This morning she also had an episode where she felt like she passed out, she will use urinating and had a little down into the shower, she is not sure how long she was doing before.  Unclear if she passed out.  No witnesses. She reports having neck pain after the fall.   Patient is noted to be febrile to 100.8, repeat was 101.3, other vitals are normal.  On exam her neurological exam is unremarkable, moves all extremities and sensation intact, some pain with neck flexion, no neck stiffness, negative Kernig sign.  Cardiac and pulmonary exam are unremarkable.    This appears to be a new onset headache that feels different from any other headache she has had before. She reports no other infectious symptoms. Given the sudden onset in association with the fever there is concern for a meningitis or an encephalitis causing her headache.  Patient sent to the ED for further evaluation, likely will need an LP, antibiotics, and further imaging.

## 2020-02-24 NOTE — ED Notes (Signed)
Admitting MD at bedside.

## 2020-02-24 NOTE — ED Provider Notes (Signed)
Medical screening examination/treatment/procedure(s) were conducted as a shared visit with non-physician practitioner(s) and myself.  I personally evaluated the patient during the encounter.    Patient reports he has had a headache for about 4 days.  She reports she was driving in her car and started fairly acutely.  She reports is behind her forehead and eyes.  Initially she noted that she had bloodshot eyes.  That however has improved.  She has not had any nausea or vomiting.  No light sensitivity.  Has not been having fevers.  She reports she does have history of migraines and the headache is somewhat similar to migraine except for the fact that typically she vomits with a migraine.  She denies having any migraine specific medications to try.  She is a tried Aleve without relief.  Alert and appropriate.  No confusion.  Sitting at the edge of stretcher well in appearance.  Alert symmetric responsive.  Extraocular motions are intact.  No significant scleral injection.  His membranes pink and moist.  Posterior oropharynx is widely patent.  Various areas of dental decay.  No surrounding significant gingivitis.  Neck is supple without meningismus.  Patient has a couple of very small shotty lymph nodes less than a centimeter in the right posterior chain.'s are clear.  Heart regular.  All movements coordinated purposeful symmetric.  Agree with plan of management.   Charlesetta Shanks, MD 02/24/20 [phone]

## 2020-02-24 NOTE — ED Triage Notes (Signed)
Pt here for eval of headache x 4 days. Taking tylenol and ibuprofen without relief. Also endorses shoulder pain since getting her flu shot in January and thinks the headache is related to same.

## 2020-02-24 NOTE — Progress Notes (Deleted)
Date: 02/24/2020               Patient Name:  Kathleen Stone MRN: [id number]  DOB: 12-Nov-1965 Age / Sex: 54 y.o., female   PCP: [name], MD         Medical Service: Internal Medicine Teaching Service         Attending Physician: Dr. Bartholomew Crews, MD    First Contact: Dr. Marva Panda Pager: [phone]  Second Contact: Dr. Truman Hayward Pager: [phone]       After Hours (After 5p/  First Contact Pager: [phone]  weekends / holidays): Second Contact Pager: [phone]   Chief Complaint: Headache  History of Present Illness:  Patient is a 54 year old female with past medical history significant for hypertension, tobacco use disorder, and depression who presents with a 4-day history of progressive worsening headache.  Patient reports headache onset approximately 4 days ago when she was driving, described as pounding bilateral frontal headache.  Patient reports her eyes being more red, but denies changes in vision.  Patient reports headache is worse at night.  Patient denies nausea, vomiting, fever, or chills.  Patient reports trying Advil and Tylenol for the headache without benefit.  She denies any weakness, numbness, photophobia, phonophobia, but does report 2 falls over the past week which is not normal for her.  She states most recently she fell once trying to sit on the toilet, is not aware of any loss of consciousness, states that she just missed the seat.  Reports a sore neck since the fall.  Meds: *Gabapentin 300 mg twice a day *Losartan 50 mg daily *Meloxicam 7.5-15 mg daily as needed for pain *Metoprolol 25 mg twice daily *Rosuvastatin 40 mg daily *Nitroglycerin 0.4 mg as needed for chest pain  Allergies: Allergies as of 02/24/2020 - Review Complete 02/24/2020  Allergen Reaction Noted  . Iohexol Hives and Itching 08/13/2007   Past Medical History:  Diagnosis Date  . Chronic lower back pain    Negative MRI of hips bilaterally, neg CT scan of A and P; MRI of T and L  spine-> very mild deg changes from T4-5 and T7-8, cervical spondylotic changes at C5-6, very mild disc bulge at L2-3 without spinal stenosis, nl ESR, ANA, RF, HIV, TSH, and B12  . DJD (degenerative joint disease) of cervical spine   . Family history of adverse reaction to anesthesia    "sisters PONV"  . Galactorrhea of both breasts 2009   R. sided w/ benign papilloma excised in 4/09. L sided in 5/09  . Gallstones   . Hypertension   . Migraine    "monthly" (11/15/2016)  . Neuropathy    Thought to be 2/2 nerve compression in the C-spine  . Obesity   . Personal history of adenomatous colonic polyp    09/2007 - diminutive adenoma [name])  . PONV (postoperative nausea and vomiting)   . Tobacco abuse     Family History:  Family History  Problem Relation Age of Onset  . Heart failure Mother   . Diabetes Mother   . Breast cancer Other        aunt  . Liver cancer Other        aunt  . Lung cancer Father     Social History: *Smokes 1 cigarette/day, 26-year smoking history, used to smoke 1 pack/day *Denies alcohol or recreational drug use  Review of Systems: A complete ROS was negative except as per HPI.  Physical Exam: Blood pressure 139/64,  pulse 68, temperature 99.2 F (37.3 C), temperature source Oral, resp. rate 18, last menstrual period 02/07/2016, SpO2 100 %. Physical Exam  Constitutional: She is well-developed, well-nourished, and in no distress.  HENT:  Head: Normocephalic and atraumatic.  Eyes: EOM are normal. Right eye exhibits no discharge. Left eye exhibits no discharge.  Neck: No tracheal deviation present.  Cardiovascular: Normal rate and regular rhythm. Exam reveals no gallop and no friction rub.  No murmur heard. Pulmonary/Chest: Effort normal and breath sounds normal. No respiratory distress. She has no wheezes. She has no rales.  Abdominal: Soft. She exhibits no distension. There is no abdominal tenderness. There is no rebound and no guarding.    Musculoskeletal:        General: No tenderness, deformity or edema. Normal range of motion.     Cervical back: Normal range of motion.  Neurological: She is alert. Coordination normal.  Muscle strength equal and full bilaterally, no focal neurologic deficit appreciated  Skin: Skin is warm and dry. No rash noted. She is not diaphoretic. No erythema.  Psychiatric: Memory and judgment normal.    CXR: personally reviewed my interpretation is no acute finding  CT Head WO Contrast (02/24/20): IMPRESSION: 1. High-density lesion in the RIGHT mid brain. Recommend MRI with and without contrast for further characterization. 2. No CT evidence of cortical infarction. No intracranial Hemorrhage.  MR Brain W WO Contrast (02/24/20): IMPRESSION: 1. 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 This does appear intra-axial although is without brainstem edema, and also without ventriculomegaly despite mass effect on the cerebral aqueduct.  The constellation is atypical for a high-grade glioma, solitary metastasis, or infection. But there are no secondary features of a high flow vascular malformation or an infarct.  Other considerations would include more indolent neoplasm (such as low-grade glioma, ganglioglioma hemangioblastoma, etc) and an inflammatory lesion such as Neurosarcoidosis.  2. Recommend Neurosurgery consultation.  CBC Latest Ref Rng & Units 02/24/2020 12/08/2019 11/15/2016  WBC 4.0 - 10.5 K/uL 6.1 - 9.9  Hemoglobin 12.0 - 15.0 g/dL 13.2 14.6 14.2  Hematocrit 36.0 - 46.0 % 41.4 43.0 43.4  Platelets 150 - 400 K/uL 136(L) - 248   CMP Latest Ref Rng & Units 02/24/2020 12/08/2019 10/30/2019  Glucose 70 - 99 mg/dL 89 88 93  BUN 6 - 20 mg/dL _0 Creatinine 0.44 - 1.00 mg/dL 1.27(H) 0.80 0.88  Sodium 135 - 145 mmol/L 138 140 142  Potassium 3.5 - 5.1 mmol/L 3.5 4.7 4.5  Chloride 98 - 111 mmol/L 109  108 104  CO2 22 - 32 mmol/L 19(L) - 21  Calcium 8.9 - 10.3 mg/dL 8.7(L) - 9.8  Total Protein 6.5 - 8.1 g/dL 6.9 - 6.9  Total Bilirubin 0.3 - 1.2 mg/dL 0.8 - 0.5  Alkaline Phos 38 - 126 U/L 86 - 117  AST 15 - 41 U/L 36 - 10  ALT 0 - 44 U/L 33 - 12    Assessment & Plan by Problem: Principal Problem:   Brain lesion Active Problems:   Essential hypertension   Hematuria, microscopic  Patient is a 54 year old female with past medical history significant for hypertension, tobacco use disorder, and depression who presents with a 4-day history of progressive worsening headache.    # Headache # Midbrain lesion Patient with 4 day history of worsening headache, midbrain lesion on MR brain, and mildly elevated temperature (Tmax of 100.8), WBC WNL  at 6.1. While without evidence for meningitis on exam maneuvers, patients elevated temperature with history is concerning for meningitis. Midbrain lesion may also be causative source of patient's symptoms. Neurosurgery was consulted, advises it is not in surgically accessible location and advises neuro-oncology consult *Broad spectrum antibiotics with vancomycin+flagyl+ceftriaxone, LP in AM per IR *Neuro-oncology consult in AM (Dr. Cecil Cobbs)  # Acute kidney injury Patient reports decreased PO intake today, denies any nausea/vomiting.  *Bladder scan *Urinalysis *1 L LR bolus followed by LR @ 100 cc/hr  # Hypertension *Continue home metoprolol, hold losartan given renal function  Diet: Heart, NPO at midnight for LP DVT Ppx: SCDs given unknown brain mass and LP in AM Code Status: FULL CODE Dispo: Admit patient to Observation with expected length of stay less than 2 midnights.  Signed: Jeanmarie Hubert, MD 02/24/2020, 10:38 PM

## 2020-02-24 NOTE — Progress Notes (Signed)
Patient ID: Kathleen Stone, female   DOB: 06/28/1966, 54 y.o.   MRN: [id number] MRI reviewed midbrain mass uncertain theology or diagnosis.  Due to location this is not surgically resectable or even likely to be able to biopsy.  recommend admit to medicine for systemic work-up and contact neuro oncology in the morning Dr. Cecil Cobbs for evaluation.

## 2020-02-24 NOTE — Progress Notes (Signed)
CC: Headache  HPI:  [name] is a 53 y.o. with a history of hypertension, hyperlipidemia, depression, tobacco use, and lumbar radiculopathy presenting with a new onset headache for the past 4 days.  She reports never having a headache like this before.  This started about 4 days ago when she was driving back to [place] from the room, she reports that she started having a pounding headache on the frontal aspect, associated with some blurry vision and some mildly runny eyes.  No radiation of the headache.  Started as an 8 out of 10 and progressed to 10 out of 10.  She denies any other associated symptoms such as nausea, vomiting, chest pain, shortness of breath, weakness, numbness, photophobia, phonophobia, cough, sinus pain, or other symptoms.  She has tried Advil and Tylenol, reports that this did not help.  She reports smoking about half a pack per day, has been cutting back past couple of days.  This morning she also had an episode where she felt like she passed out, she will use urinating and had a little down into the shower, she is not sure how long she was doing before.  Unclear if she passed out.  No witnesses. She reports having neck pain after the fall.   Patient is noted to be febrile to 100.8, other vitals are normal.  On exam her neurological exam is unremarkable, moves all extremities and sensation intact, some pain with neck flexion, no neck stiffness, negative Kernig sign.  Cardiac and pulmonary exam are unremarkable.  This appears to be a new onset headache that feels different from any other headache she has had before.  Given the sudden onset in association with the fever there is concern for a meningitis or an encephalitis causing her headache.  Patient sent to the ED for further evaluation, likely will need an LP and further imaging.  Past Medical History:  Diagnosis Date  . Chronic lower back pain    Negative MRI of hips bilaterally, neg CT scan of A and P; MRI of T  and L spine-> very mild deg changes from T4-5 and T7-8, cervical spondylotic changes at C5-6, very mild disc bulge at L2-3 without spinal stenosis, nl ESR, ANA, RF, HIV, TSH, and B12  . DJD (degenerative joint disease) of cervical spine   . Family history of adverse reaction to anesthesia    "sisters PONV"  . Galactorrhea of both breasts 2009   R. sided w/ benign papilloma excised in 4/09. L sided in 5/09  . Gallstones   . Hypertension   . Migraine    "monthly" (11/15/2016)  . Neuropathy    Thought to be 2/2 nerve compression in the C-spine  . Obesity   . Personal history of adenomatous colonic polyp    09/2007 - diminutive adenoma [name])  . PONV (postoperative nausea and vomiting)   . Tobacco abuse    Review of Systems: Positive for headaches, blurry vision, fall, neck pain, and runny eyes.  Denies nausea, vomiting, chest pain, shortness of breath, weakness, numbness, photophobia, phonophobia, stress, cough, or sinus pain.  Denies any fevers that she is aware of.  Physical Exam:  Vitals:   02/24/20 1034  BP: 135/66  Pulse: 71  Temp: (!) 100.8 F (38.2 C)  TempSrc: Oral  SpO2: 100%  Weight: 264 lb 14.4 oz (120.2 kg)  Height: '5\' 6"'  (1.676 m)   Physical Exam  Constitutional: She is oriented to person, place, and time.  Middle aged female,  tired appearing, NAD  HENT:  Head: Normocephalic and atraumatic.  Eyes: Pupils are equal, round, and reactive to light. Conjunctivae and EOM are normal. Right eye exhibits no discharge. Left eye exhibits no discharge.  Neck: No thyromegaly present.  Cardiovascular: Normal rate, regular rhythm and normal heart sounds.  Pulmonary/Chest: Effort normal and breath sounds normal. No respiratory distress.  Abdominal: Soft. Bowel sounds are normal. She exhibits no distension.  Musculoskeletal:        General: Tenderness (TTP over posterior neck) present. No edema. Normal range of motion.     Cervical back: Normal range of motion and neck supple.    Neurological: She is alert and oriented to person, place, and time. No cranial nerve deficit. Gait normal. Coordination normal.  Neck pain with flexion, negative Kernigs sign  Skin: Skin is warm and dry.  Psychiatric: Mood and affect normal.     Assessment & Plan:   See Encounters Tab for problem based charting.  Patient seen with Dr. Evette Doffing

## 2020-02-24 NOTE — ED Provider Notes (Signed)
[facility] EMERGENCY DEPARTMENT Provider Note   CSN: [id number] Arrival date & time: 02/24/20  1134     History Chief Complaint  Patient presents with  . Headache    Kathleen Stone is a 54 y.o. female.  Patient with history of migraine headaches, right shoulder pain since getting her flu shot in January 2021, current tobacco abuse --presents the emergency department for headache and fever.  Patient was seen at the family clinic office today and was sent to the emergency department for evaluation.  Patient reports for days of headache that is throbbing in nature, on the top of her head.  She denies any neck pain or confusion.  She has had fevers which started around the same time.  She states that she has had a fall yesterday and 2 this morning where she just tripped.  Today she reports falling while in the shower and falling when she was sitting down to use the toilet. She denies prodrome of lightheadedness or syncope to me.  No nausea, vomiting, abdominal pain.  She does have some urinary urgency but no dysuria, hematuria, or increased frequency.  She coughs when she smokes, but otherwise no significant cough.  No URI symptoms.  She denies tick bites or rashes.  Patient denies signs of stroke including: facial droop, slurred speech, aphasia, weakness/numbness in extremities, imbalance/trouble walking.  The onset of this condition was acute. The course is constant. Aggravating factors: none. Alleviating factors: none.          Past Medical History:  Diagnosis Date  . Chronic lower back pain    Negative MRI of hips bilaterally, neg CT scan of A and P; MRI of T and L spine-> very mild deg changes from T4-5 and T7-8, cervical spondylotic changes at C5-6, very mild disc bulge at L2-3 without spinal stenosis, nl ESR, ANA, RF, HIV, TSH, and B12  . DJD (degenerative joint disease) of cervical spine   . Family history of adverse reaction to anesthesia    "sisters PONV"  .  Galactorrhea of both breasts 2009   R. sided w/ benign papilloma excised in 4/09. L sided in 5/09  . Gallstones   . Hypertension   . Migraine    "monthly" (11/15/2016)  . Neuropathy    Thought to be 2/2 nerve compression in the C-spine  . Obesity   . Personal history of adenomatous colonic polyp    09/2007 - diminutive adenoma [name])  . PONV (postoperative nausea and vomiting)   . Tobacco abuse     Patient Active Problem List   Diagnosis Date Noted  . Headache 02/24/2020  . Adhesive capsulitis of right shoulder 01/27/2020  . Cholelithiasis 10/30/2019  . Hyperlipidemia 05/21/2019  . Abnormal stress test 01/16/2017  . Tingling of left upper extremity 11/15/2016  . Essential hypertension 06/13/2016  . Right knee pain 02/16/2016  . Lumbar radiculopathy, chronic 01/04/2016  . Scleral discoloration 11/01/2015  . Personal history of adenomatous colonic polyp 08/17/2012  . Syncope 07/24/2012  . Preventative health care 07/24/2012  . Depression 08/23/2011  . DEGENERATIVE DISC DISEASE, 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  . NEUROPATHY 07/28/2010  . Disturbance in sleep behavior 07/28/2010  . LOW BACK PAIN SYNDROME 07/10/2007  . OBESITY NOS 01/15/2007  . TOBACCO ABUSE 10/07/2006  . MIGRAINE HEADACHE 10/04/2006    Past Surgical History:  Procedure Laterality Date  . APPENDECTOMY    . BREAST SURGERY Right 12/2007   central duct excision  . CHOLECYSTECTOMY N/A 12/08/2019  Procedure: LAPAROSCOPIC CHOLECYSTECTOMY;  Surgeon: Ralene Ok, MD;  Location: [facility];  Service: General;  Laterality: N/A;  . COLONOSCOPY W/ BIOPSIES AND POLYPECTOMY  09/2007   Dr. [name]. 2 small adenomatous polyps, internal hemorrhoids-Grade 1. Rec  routine colonoscopy at age 54.  Marland Kitchen COLONOSCOPY W/ BIOPSIES AND POLYPECTOMY    . NASAL SEPTOPLASTY W/ TURBINOPLASTY Bilateral 04/2008   Septoplasty with bilateral inferior turbinate reduction; Dr. Lucia Gaskins  . SHOULDER OPEN ROTATOR CUFF REPAIR Right     after hurting it by  moving heavy objecs     OB History   No obstetric history on file.     Family History  Problem Relation Age of Onset  . Heart failure Mother   . Diabetes Mother   . Breast cancer Other        aunt  . Liver cancer Other        aunt  . Lung cancer Father     Social History   Tobacco Use  . Smoking status: Current Every Day Smoker    Packs/day: 0.50    Years: 31.00    Pack years: 15.50    Types: Cigarettes  . Smokeless tobacco: Never Used  Substance Use Topics  . Alcohol use: No    Alcohol/week: 0.0 standard drinks  . Drug use: No    Home Medications Prior to Admission medications   Medication Sig Start Date End Date Taking? Authorizing Provider  gabapentin (NEURONTIN) 300 MG capsule TAKE ONE CAPSULE BY MOUTH TWICE A DAY Patient taking differently: Take 300 mg by mouth at bedtime.  11/30/19   [name], MD  losartan (COZAAR) 50 MG tablet TAKE ONE TABLET BY MOUTH ONE TIME DAILY 12/29/19   Bartholomew Crews, MD  meloxicam (MOBIC) 7.5 MG tablet Take 1-2 tablets (7.5-15 mg total) by mouth daily as needed for pain. 02/11/20 03/12/20  Mosetta Anis, MD  metoprolol tartrate (LOPRESSOR) 25 MG tablet Take 1 tablet (25 mg total) by mouth 2 (two) times daily. 12/22/19   Lucious Groves, DO  nitroGLYCERIN (NITROSTAT) 0.4 MG SL tablet Place 1 tablet (0.4 mg total) under the tongue every 5 (five) minutes as needed for chest pain. 01/16/17 12/04/19  Strader, Fransisco Hertz, PA-C  rosuvastatin (CRESTOR) 40 MG tablet Take 1 tablet (40 mg total) by mouth daily. 01/27/20   Mosetta Anis, MD    Allergies    Iohexol  Review of Systems   Review of Systems  Constitutional: Positive for fatigue and fever.  HENT: Negative for congestion, dental problem, rhinorrhea and sinus pressure.   Eyes: Negative for photophobia, discharge, redness and visual disturbance.  Respiratory: Negative for shortness of breath.   Cardiovascular: Negative for chest pain.  Gastrointestinal: Negative for nausea and  vomiting.  Genitourinary: Positive for urgency. Negative for dysuria, flank pain, frequency and hematuria.  Musculoskeletal: Negative for gait problem, neck pain and neck stiffness.  Skin: Negative for rash.  Neurological: Positive for headaches. Negative for syncope, speech difficulty, weakness, light-headedness and numbness.  Psychiatric/Behavioral: Negative for confusion.    Physical Exam Updated Vital Signs BP (!) 116/49 (BP Location: Left Arm)   Pulse 76   Temp 99.5 F (37.5 C) (Oral)   Resp 16   LMP 02/07/2016 (Exact Date)   SpO2 100%   Physical Exam Vitals and nursing note reviewed.  Constitutional:      Appearance: She is well-developed.  HENT:     Head: Normocephalic and atraumatic.     Right Ear: Tympanic membrane, ear  canal and external ear normal.     Left Ear: Tympanic membrane, ear canal and external ear normal.     Nose: Nose normal.     Mouth/Throat:     Pharynx: Uvula midline.  Eyes:     General: Lids are normal.        Right eye: No discharge.        Left eye: No discharge.     Extraocular Movements:     Right eye: No nystagmus.     Left eye: No nystagmus.     Conjunctiva/sclera: Conjunctivae normal.     Pupils: Pupils are equal, round, and reactive to light.     Comments: Arcus senilis noted  Neck:     Comments: Full ROM neck, no signs of meningismus.  Cardiovascular:     Rate and Rhythm: Normal rate and regular rhythm.     Heart sounds: Normal heart sounds.  Pulmonary:     Effort: Pulmonary effort is normal.     Breath sounds: Normal breath sounds.  Abdominal:     Palpations: Abdomen is soft.     Tenderness: There is no abdominal tenderness.  Musculoskeletal:     Right shoulder: Tenderness (R deltoid) present. Decreased range of motion.     Cervical back: Normal range of motion and neck supple. No tenderness or bony tenderness.     Thoracic back: No tenderness or bony tenderness.     Lumbar back: No tenderness or bony tenderness.  Skin:     General: Skin is warm and dry.  Neurological:     Mental Status: She is alert and oriented to person, place, and time.     GCS: GCS eye subscore is 4. GCS verbal subscore is 5. GCS motor subscore is 6.     Cranial Nerves: No cranial nerve deficit.     Sensory: No sensory deficit.     Motor: No weakness.     Coordination: Coordination normal.     Gait: Gait normal.     Deep Tendon Reflexes: Reflexes are normal and symmetric.     Comments: Patient can stand up and walk in hallway without difficulty or assistance.      ED Results / Procedures / Treatments   Labs (all labs ordered are listed, but only abnormal results are displayed) Labs Reviewed  CBC WITH DIFFERENTIAL/PLATELET - Abnormal; Notable for the following components:      Result Value   Platelets 136 (*)    All other components within normal limits  COMPREHENSIVE METABOLIC PANEL - Abnormal; Notable for the following components:   CO2 19 (*)    Creatinine, Ser 1.27 (*)    Calcium 8.7 (*)    Albumin 3.2 (*)    GFR calc non Af Amer 47 (*)    GFR calc Af Amer 55 (*)    All other components within normal limits  URINALYSIS, ROUTINE W REFLEX MICROSCOPIC - Abnormal; Notable for the following components:   APPearance HAZY (*)    Hgb urine dipstick MODERATE (*)    Protein, ur 30 (*)    Bacteria, UA RARE (*)    All other components within normal limits  CULTURE, BLOOD (ROUTINE X 2)  CULTURE, BLOOD (ROUTINE X 2)  SARS CORONAVIRUS 2 BY RT PCR (HOSPITAL ORDER, [facility])  LACTIC ACID, PLASMA  HIV ANTIBODY (ROUTINE TESTING W REFLEX)  COMPREHENSIVE METABOLIC PANEL  CBC    EKG None  Radiology DG Chest 2 View  Result Date:  02/24/2020 CLINICAL DATA:  Fever and headaches for 1 day. EXAM: CHEST - 2 VIEW COMPARISON:  05/09/2019 FINDINGS: The cardiac silhouette, mediastinal and hilar contours are normal. The lungs are clear. No pleural effusion or pulmonary lesions. The bony thorax is intact. IMPRESSION:  No acute cardiopulmonary findings. Electronically Signed   By: Marijo Sanes M.D.   On: 02/24/2020 15:52   CT Head Wo Contrast  Result Date: 02/24/2020 CLINICAL DATA:  Headache.  Worsening headache. EXAM: CT HEAD WITHOUT CONTRAST TECHNIQUE: Contiguous axial images were obtained from the base of the skull through the vertex without intravenous contrast. COMPARISON:  None. FINDINGS: Brain: No acute intracranial hemorrhage. High-density rounded lesion within the RIGHT midbrain measuring 10 mm by 8 mm (image 10/series 2) in the region the quadrigeminal plate. No CT evidence of acute infarction. No midline shift or mass effect. No hydrocephalus. Basilar cisterns are patent. Vascular: No hyperdense vessel or unexpected calcification. Skull: Normal. Negative for fracture or focal lesion. Sinuses/Orbits: Paranasal sinuses and mastoid air cells are clear. Orbits are clear. Other: None. IMPRESSION: 1. High-density lesion in the RIGHT mid brain. Recommend MRI with and without contrast for further characterization. 2. No CT evidence of cortical infarction. No intracranial hemorrhage. Electronically Signed   By: Suzy Bouchard M.D.   On: 02/24/2020 16:53    Procedures Procedures (including critical care time)  Medications Ordered in ED Medications  lactated ringers bolus 1,000 mL (has no administration in time range)    Followed by  lactated ringers infusion (has no administration in time range)  metroNIDAZOLE (FLAGYL) IVPB 500 mg (has no administration in time range)  cefTRIAXone (ROCEPHIN) 2 g in sodium chloride 0.9 % 100 mL IVPB (has no administration in time range)  vancomycin (VANCOREADY) IVPB 2000 mg/400 mL (has no administration in time range)  vancomycin (VANCOREADY) IVPB 1500 mg/300 mL (has no administration in time range)  metoprolol tartrate (LOPRESSOR) tablet 25 mg (has no administration in time range)  rosuvastatin (CRESTOR) tablet 40 mg (has no administration in time range)  metoCLOPramide  (REGLAN) injection 10 mg (10 mg Intravenous Given 02/24/20 1601)  diphenhydrAMINE (BENADRYL) injection 25 mg (25 mg Intravenous Given 02/24/20 1601)  gadobutrol (GADAVIST) 1 MMOL/ML injection 10 mL (10 mLs Intravenous Contrast Given 02/24/20 1839)    ED Course  I have reviewed the triage vital signs and the nursing notes.  Pertinent labs & imaging results that were available during my care of the patient were reviewed by me and considered in my medical decision making (see chart for details).  Patient seen and examined. Work-up initiated.  Will obtain head CT.  Migraine cocktail ordered.  Will evaluate for infection with chest CT, UA.  Patient does not have any meningeal signs or findings concerning for bacterial meningitis at this time.  She is not confused.  She has a reassuring neuro exam is able to ambulate without any difficulty.  Vital signs reviewed and are as follows: BP (!) 116/49 (BP Location: Left Arm)   Pulse 76   Temp 99.5 F (37.5 C) (Oral)   Resp 16   LMP 02/07/2016 (Exact Date)   SpO2 100%   5:08 PM patient rechecked.  States that her headache is improved after migraine cocktail.  Blood work is pending.  CT imaging reveals an abnormality in the midbrain.  Recommend MRI.  Discussed imaging results with patient.  MRI ordered to further evaluate.  MRI results reviewed.  Discussed with neurosurgery Ples Specter) and Dr. Saintclair Halsted placed in note in the chart.  I discussed findings with patient at bedside.  I will discuss her case with her primary care.  Discussed neurosurgery recommendation for inpatient evaluation.  Discussed with internal medicine teaching service who will see the patient.  Plan for admission for work-up for neoplasm of unclear etiology at this point.     MDM Rules/Calculators/A&P                      Admit.   Final Clinical Impression(s) / ED Diagnoses Final diagnoses:  Headache  Brain neoplasm [facility])    Rx / DC Orders ED Discharge Orders    None         Carlisle Cater, Hershal Coria 02/24/20 2227    Charlesetta Shanks, MD 02/25/20 0020

## 2020-02-25 ENCOUNTER — Telehealth: Payer: Self-pay | Admitting: *Deleted

## 2020-02-25 ENCOUNTER — Other Ambulatory Visit: Payer: BC Managed Care – PPO

## 2020-02-25 ENCOUNTER — Other Ambulatory Visit: Payer: Self-pay | Admitting: Radiation Therapy

## 2020-02-25 ENCOUNTER — Observation Stay (HOSPITAL_COMMUNITY): Payer: BC Managed Care – PPO

## 2020-02-25 DIAGNOSIS — G939 Disorder of brain, unspecified: Secondary | ICD-10-CM

## 2020-02-25 LAB — COMPREHENSIVE METABOLIC PANEL
ALT: 32 U/L (ref 0–44)
AST: 38 U/L (ref 15–41)
Albumin: 2.9 g/dL — ABNORMAL LOW (ref 3.5–5.0)
Alkaline Phosphatase: 82 U/L (ref 38–126)
Anion gap: 9 (ref 5–15)
BUN: 13 mg/dL (ref 6–20)
CO2: 21 mmol/L — ABNORMAL LOW (ref 22–32)
Calcium: 8.5 mg/dL — ABNORMAL LOW (ref 8.9–10.3)
Chloride: 106 mmol/L (ref 98–111)
Creatinine, Ser: 1.06 mg/dL — ABNORMAL HIGH (ref 0.44–1.00)
GFR calc Af Amer: >60 mL/min (ref >60)
GFR calc non Af Amer: 59 mL/min — ABNORMAL LOW (ref >60)
Glucose, Bld: 139 mg/dL — ABNORMAL HIGH (ref 70–99)
Potassium: 3.3 mmol/L — ABNORMAL LOW (ref 3.5–5.1)
Sodium: 136 mmol/L (ref 135–145)
Total Bilirubin: 1 mg/dL (ref 0.3–1.2)
Total Protein: 6.5 g/dL (ref 6.5–8.1)

## 2020-02-25 LAB — CBC
HCT: 39.2 % (ref 36.0–46.0)
Hemoglobin: 12.6 g/dL (ref 12.0–15.0)
MCH: 28.5 pg (ref 26.0–34.0)
MCHC: 32.1 g/dL (ref 30.0–36.0)
MCV: 88.7 fL (ref 80.0–100.0)
Platelets: 168 10*3/uL (ref 150–400)
RBC: 4.42 MIL/uL (ref 3.87–5.11)
RDW: 14.8 % (ref 11.5–15.5)
WBC: 7.6 10*3/uL (ref 4.0–10.5)
nRBC: 0 % (ref 0.0–0.2)

## 2020-02-25 LAB — SARS CORONAVIRUS 2 BY RT PCR (HOSPITAL ORDER, PERFORMED IN ~~LOC~~ HOSPITAL LAB): SARS Coronavirus 2: NEGATIVE

## 2020-02-25 LAB — HIV ANTIBODY (ROUTINE TESTING W REFLEX): HIV Screen 4th Generation wRfx: NONREACTIVE

## 2020-02-25 MED ORDER — ACETAMINOPHEN 325 MG PO TABS
650.0000 mg | ORAL_TABLET | Freq: Four times a day (QID) | ORAL | Status: DC | PRN
  Administered 2020-02-25 – 2020-02-29 (×6): 650 mg via ORAL
  Filled 2020-02-25 (×6): qty 2

## 2020-02-25 MED ORDER — DIPHENHYDRAMINE HCL 25 MG PO CAPS
50.0000 mg | ORAL_CAPSULE | Freq: Once | ORAL | Status: AC
  Administered 2020-02-26: 50 mg via ORAL
  Filled 2020-02-25: qty 2

## 2020-02-25 MED ORDER — PREDNISONE 20 MG PO TABS
50.0000 mg | ORAL_TABLET | Freq: Four times a day (QID) | ORAL | Status: AC
  Administered 2020-02-25 – 2020-02-26 (×3): 50 mg via ORAL
  Filled 2020-02-25 (×3): qty 3

## 2020-02-25 MED ORDER — DIPHENHYDRAMINE HCL 50 MG/ML IJ SOLN: 50.0000 mg | Freq: Once | INTRAMUSCULAR | Status: AC

## 2020-02-25 NOTE — H&P (Signed)
Date: 02/25/2020               Patient Name:  Kathleen Stone MRN: [id number]  DOB: 1965/12/05 Age / Sex: 54 y.o., female   PCP: [name], MD         Medical Service: Internal Medicine Teaching Service         Attending Physician: Dr. Lynnae January, Real Cons, MD    First Contact: Marva Panda, MD, [place] Pager: [place] [phone])  Second Contact: Truman Hayward, MD, Vonna Kotyk Pager: [place] [phone])       After Hours (After 5p/  First Contact Pager: [phone]  weekends / holidays): Second Contact Pager: [phone]   Chief Complaint: Headache  History of Present Illness:  Patient is a 54 year old female with past medical history significant for hypertension, tobacco use disorder, and depression who presents with a 4-day history of progressive worsening headache.  Patient reports headache onset approximately 4 days ago when she was driving, described as pounding bilateral frontal headache.  Patient reports her eyes being more red, but denies changes in vision.  Patient reports headache is worse at night.  Patient denies nausea, vomiting, fever, or chills.  Patient reports trying Advil and Tylenol for the headache without benefit.  She denies any weakness, numbness, photophobia, phonophobia, but does report 2 falls over the past week which is not normal for her.  She states most recently she fell once trying to sit on the toilet, is not aware of any loss of consciousness, states that she just missed the seat.  Reports a sore neck since the fall.    Lab Orders     Culture, blood (routine x 2)     SARS Coronavirus 2 by RT PCR (hospital order, performed in [facility] hospital lab) Nasopharyngeal Nasopharyngeal Swab     CBC with Differential/Platelet     Comprehensive metabolic panel     Lactic acid, plasma     Urinalysis, Routine w reflex microscopic     HIV Antibody (routine testing w rflx)     Comprehensive metabolic panel     CBC   Meds: *Gabapentin 300 mg twice a day *Losartan 50 mg  daily *Meloxicam 7.5-15 mg daily as needed for pain *Metoprolol 25 mg twice daily *Rosuvastatin 40 mg daily *Nitroglycerin 0.4 mg as needed for chest pain   Allergies: Allergies as of 02/24/2020 - Review Complete 02/24/2020  Allergen Reaction Noted  . Iohexol Hives and Itching 08/13/2007   Past Medical History:  Diagnosis Date  . Chronic lower back pain    Negative MRI of hips bilaterally, neg CT scan of A and P; MRI of T and L spine-> very mild deg changes from T4-5 and T7-8, cervical spondylotic changes at C5-6, very mild disc bulge at L2-3 without spinal stenosis, nl ESR, ANA, RF, HIV, TSH, and B12  . DJD (degenerative joint disease) of cervical spine   . Family history of adverse reaction to anesthesia    "sisters PONV"  . Galactorrhea of both breasts 2009   R. sided w/ benign papilloma excised in 4/09. L sided in 5/09  . Gallstones   . Hypertension   . Migraine    "monthly" (11/15/2016)  . Neuropathy    Thought to be 2/2 nerve compression in the C-spine  . Obesity   . Personal history of adenomatous colonic polyp    09/2007 - diminutive adenoma [name])  . PONV (postoperative nausea and vomiting)   . Tobacco abuse     Problem Relation  Age of Onset  . Heart failure Mother   . Diabetes Mother   . Breast cancer Other        aunt  . Liver cancer Other        aunt  . Lung cancer Father      Social History: *Smokes 1 cigarette/day, 26-year smoking history, used to smoke 1 pack/day *Denies alcohol or recreational drug use  Review of Systems: A complete ROS was negative except as per HPI.   Physical Exam: Blood pressure (!) 146/60, pulse 84, temperature (!) 100.4 F (38 C), temperature source Oral, resp. rate 20, last menstrual period 02/07/2016, SpO2 99 %.  Constitutional: She is well-developed, well-nourished, and in no distress.  HENT:  Head: Normocephalic and atraumatic.  Eyes: EOM are normal. Right eye exhibits no discharge. Left eye exhibits no  discharge.  Neck: No tracheal deviation present.  Cardiovascular: Normal rate and regular rhythm. Exam reveals no gallop and no friction rub.  No murmur heard. Pulmonary/Chest: Effort normal and breath sounds normal. No respiratory distress. She has no wheezes. She has no rales.  Abdominal: Soft. She exhibits no distension. There is no abdominal tenderness. There is no rebound and no guarding.  Musculoskeletal:        General: No tenderness, deformity or edema. Normal range of motion.     Cervical back: Normal range of motion.  Neurological: She is alert. Coordination normal.  Muscle strength equal and full bilaterally, no focal neurologic deficit appreciated  Skin: Skin is warm and dry. No rash noted. She is not diaphoretic. No erythema.  Psychiatric: Memory and judgment normal.   CXR: personally reviewed my interpretation is no acute finding  CT Head WO Contrast (02/24/20): IMPRESSION: 1. High-density lesion in the RIGHT mid brain. Recommend MRI with and without contrast for further characterization. 2. No CT evidence of cortical infarction. No intracranial Hemorrhage.  MR Brain W WO Contrast (02/24/20): IMPRESSION: 1. 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 This does appear intra-axial although is without brainstem edema, and also without ventriculomegaly despite mass effect on the cerebral aqueduct.  The constellation is atypical for a high-grade glioma, solitary metastasis, or infection. But there are no secondary features of a high flow vascular malformation or an infarct.  Other considerations would include more indolent neoplasm (such as low-grade glioma, ganglioglioma hemangioblastoma, etc) and an inflammatory lesion such as Neurosarcoidosis.  2. Recommend Neurosurgery consultation.  Assessment & Plan by Problem: Principal Problem:   Brain lesion Active Problems:    Essential hypertension   Hematuria, microscopic   Patient is a 54 year old female with past medical history significant for hypertension, tobacco use disorder, and depression who presents with a 4-day history of progressive worsening headache.    # Headache # Midbrain lesion Patient with 4 day history of worsening headache, midbrain lesion on MR brain, and mildly elevated temperature (Tmax of 100.8), WBC WNL at 6.1. While without evidence for meningitis on exam maneuvers, patients elevated temperature with history is concerning for meningitis. Midbrain lesion may also be causative source of patient's symptoms. Neurosurgery was consulted, advises it is not in surgically accessible location and advises neuro-oncology consult *Broad spectrum antibiotics with vancomycin+flagyl+ceftriaxone, LP in AM per IR *Neuro-oncology consult in AM (Dr. Cecil Cobbs)  # Acute kidney injury Patient reports decreased PO intake today, denies any nausea/vomiting.  *Bladder scan *Urinalysis *1 L LR bolus followed by LR @ 100 cc/hr  #  Hypertension *Continue home metoprolol, hold losartan given renal function  Diet: Heart, NPO at midnight for LP DVT Ppx: SCDs given unknown brain mass and LP in AM Code Status: FULL CODE Dispo: Admit patient to Observation with expected length of stay less than 2 midnights.  Signed: Jean Rosenthal, MD 02/25/2020, 7:00 AM  Pager: [phone] Internal Medicine Teaching Service After 5pm on weekdays and 1pm on weekends: On Call pager: [phone]

## 2020-02-25 NOTE — Progress Notes (Signed)
   Subjective: Pt seen at bedside today. States that she has a persistent headache of a few hours duration that is worsened by lying down and not improved with medications. Reports eye soreness, right shoulder pain, and 3 falls over the past 2 weeks. Right shoulder pain has been present since January, and she attributes this to previous flu shot. When she fell last, she does not remember what happened but knows that she was trying to sit on the toilet and fell into the shower. Denies fevers, chills, photophobia, phonophobia, and abdominal pain.  Objective:  Vital signs in last 24 hours: Vitals:   02/25/20 0633 02/25/20 0950 02/25/20 1154 02/25/20 1210  BP:   136/64 (!) 136/55  Pulse: 84  76 60  Resp:    18  Temp:   100.2 F (37.9 C) 100 F (37.8 C)  TempSrc:   Oral Oral  SpO2: 99%  99% 100%  Weight:  120.7 kg    Height:  5\' 6"  (1.676 m)     General: Pt is in NAD HEENT: [place]/AT. See neuro exam for eye findings. Cardiac: RRR. No murmurs, rubs, or gallops Respiratory: Lungs are CTA BL. No wheezes, rales, or rhonchi Abdominal: Abdomen is soft and non-tender with normoactive bowel sounds. Scar in the right lower quadrant noted from pervious cholecystectomy Skin: Warm and dry. MSK: spontaneously moving all extremities Neuro: CN II, III, IV, VI: EOMI intact. Pupils equally round and reactive to light. No ptosis CN V: Facial sensation is the same BL CN VII: No facial asymmetry noted CN VIII: hearing is intact to voice and rubbing of fingers BL CN IX, X: Palate is elevated symmetrically. Phonation is normal CN XI: Head turning grossly intact CN XII: Tongue is midline with normal movements and no atrophy.  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 Headaches are minimally responsive to migraine cocktails.  Headache Midbrain lesion: Pt reports headache this morning that has lasted several hours. Last temperature 100.2, WBC WNL at 7.6. Midbrain lesion concerning for infection vs malignancy. Lesion may be responsible for headaches. Location of mass is not surgically accessible per neurosurgery. Neuro-oncology consulted. Per neuro-oncology recommendations, holding LP until full-body CT scan can be performed to assess for brain metastasis. -We appreciate neuro-oncology's recommendations -Prednisone 50 mg PO QD and Benadryl 50 mg for contrast prep  AKI:  Cr 1.06 (down from 1.27) and BUN 13 (unchanged from previous). Resolved with IV fluids.  HTN: -Metoprolol 25 mg PO QD  -Continue home losartan 50 mg PO QD  HLD: -Rosuvastatin 40 mg PO QD  Diet: -Heart healthy  DVT prophylaxis: -SCDs given unknown brain mass  CODE STATUS:FULL  Prior to Admission Living Arrangement: Home Anticipated Discharge Location: Home Barriers to Discharge: None Dispo: Anticipated discharge in greater than 2 day(s).   Josepha Pigg, Medical Student 02/25/2020, 2:40 PM Pager: [phone] After 5pm on weekdays and 1pm on weekends: On Call pager [phone]

## 2020-02-25 NOTE — Consult Note (Signed)
[facility] Consult Note  Patient Care Team: [name], MD as PCP - General  CHIEF COMPLAINTS/PURPOSE OF CONSULTATION:  Brain Mass  HISTORY OF PRESENTING ILLNESS:  Kathleen Stone 54 y.o. female presented with several days of progressive daily headaches.  She describes holocranial pain without time of day predilection, positional effects, no nausea/vomiting or photophobia.  CNS imaging was performed and demonstrated enhancing mass in midbrain of unclear etiology.  She has no complaints at this time, no issues with hands or feet, no visual impairment.  MEDICAL HISTORY:  Past Medical History:  Diagnosis Date  . Chronic lower back pain    Negative MRI of hips bilaterally, neg CT scan of A and P; MRI of T and L spine-> very mild deg changes from T4-5 and T7-8, cervical spondylotic changes at C5-6, very mild disc bulge at L2-3 without spinal stenosis, nl ESR, ANA, RF, HIV, TSH, and B12  . DJD (degenerative joint disease) of cervical spine   . Family history of adverse reaction to anesthesia    "sisters PONV"  . Galactorrhea of both breasts 2009   R. sided w/ benign papilloma excised in 4/09. L sided in 5/09  . Gallstones   . Hypertension   . Migraine    "monthly" (11/15/2016)  . Neuropathy    Thought to be 2/2 nerve compression in the C-spine  . Obesity   . Personal history of adenomatous colonic polyp    09/2007 - diminutive adenoma [name])  . PONV (postoperative nausea and vomiting)   . Tobacco abuse     SURGICAL HISTORY: Past Surgical History:  Procedure Laterality Date  . APPENDECTOMY    . BREAST SURGERY Right 12/2007   central duct excision  . CHOLECYSTECTOMY N/A 12/08/2019   Procedure: LAPAROSCOPIC CHOLECYSTECTOMY;  Surgeon: Ralene Ok, MD;  Location: [place];  Service: General;  Laterality: N/A;  . COLONOSCOPY W/ BIOPSIES AND POLYPECTOMY  09/2007   Dr. [name]. 2 small adenomatous polyps, internal hemorrhoids-Grade 1. Rec  routine  colonoscopy at age 39.  Marland Kitchen COLONOSCOPY W/ BIOPSIES AND POLYPECTOMY    . NASAL SEPTOPLASTY W/ TURBINOPLASTY Bilateral 04/2008   Septoplasty with bilateral inferior turbinate reduction; Dr. Lucia Gaskins  . SHOULDER OPEN ROTATOR CUFF REPAIR Right     after hurting it by moving heavy objecs    SOCIAL HISTORY: Social History   Socioeconomic History  . Marital status: Married    Spouse name: Not on file  . Number of children: Not on file  . Years of education: Not on file  . Highest education level: Not on file  Occupational History  . Not on file  Tobacco Use  . Smoking status: Current Every Day Smoker    Packs/day: 0.50    Years: 31.00    Pack years: 15.50    Types: Cigarettes  . Smokeless tobacco: Never Used  Vaping Use  . Vaping Use: Former  Substance and Sexual Activity  . Alcohol use: No    Alcohol/week: 0.0 standard drinks  . Drug use: No  . Sexual activity: Yes    Partners: Female  Other Topics Concern  . Not on file  Social History Narrative   Domestic Partner: female has emphysema   Smoked 2 packs/day for 20+ years, down to 1/2ppd now, willing to try to quit    Alcohol use-no   Drug use-no   Regular exercise-no   Social Determinants of Health   Financial Resource Strain:   . Difficulty of Paying Living Expenses:  Food Insecurity:   . Worried About Charity fundraiser in the Last Year:   . Arboriculturist in the Last Year:   Transportation Needs:   . Film/video editor (Medical):   Marland Kitchen Lack of Transportation (Non-Medical):   Physical Activity:   . Days of Exercise per Week:   . Minutes of Exercise per Session:   Stress:   . Feeling of Stress :   Social Connections:   . Frequency of Communication with Friends and Family:   . Frequency of Social Gatherings with Friends and Family:   . Attends Religious Services:   . Active Member of Clubs or Organizations:   . Attends Archivist Meetings:   Marland Kitchen Marital Status:   Intimate Partner Violence:   .  Fear of Current or Ex-Partner:   . Emotionally Abused:   Marland Kitchen Physically Abused:   . Sexually Abused:     FAMILY HISTORY: Family History  Problem Relation Age of Onset  . Heart failure Mother   . Diabetes Mother   . Breast cancer Other        aunt  . Liver cancer Other        aunt  . Lung cancer Father     ALLERGIES:  is allergic to iohexol.  MEDICATIONS:  Current Facility-Administered Medications  Medication Dose Route Frequency Provider Last Rate Last Admin  . acetaminophen (TYLENOL) tablet 650 mg  650 mg Oral Q6H PRN Mosetta Anis, MD   650 mg at 02/25/20 1318  . [START ON 02/26/2020] diphenhydrAMINE (BENADRYL) capsule 50 mg  50 mg Oral Once Harvie Heck, MD       Or  . Derrill Memo ON 02/26/2020] diphenhydrAMINE (BENADRYL) injection 50 mg  50 mg Intravenous Once Aslam, Sadia, MD      . metoprolol tartrate (LOPRESSOR) tablet 25 mg  25 mg Oral BID Jean Rosenthal, MD   25 mg at 02/25/20 0949  . predniSONE (DELTASONE) tablet 50 mg  50 mg Oral Q6H Aslam, Sadia, MD      . rosuvastatin (CRESTOR) tablet 40 mg  40 mg Oral Daily Jean Rosenthal, MD   40 mg at 02/25/20 0949    REVIEW OF SYSTEMS:   Constitutional: Denies fevers, chills or abnormal weight loss Eyes: Denies blurriness of vision Ears, nose, mouth, throat, and face: Denies mucositis or sore throat Respiratory: Denies cough, dyspnea or wheezes Cardiovascular: Denies palpitation, chest discomfort or lower extremity swelling Gastrointestinal:  Denies nausea, constipation, diarrhea GU: Denies dysuria or incontinence Skin: Denies abnormal skin rashes Neurological: Per HPI Musculoskeletal: Denies joint pain, back or neck discomfort. No decrease in ROM Behavioral/Psych: Denies anxiety, disturbance in thought content, and mood instability   PHYSICAL EXAMINATION: Vitals:   02/25/20 1210 02/25/20 1700  BP: (!) 136/55 (!) 141/62  Pulse: 60 66  Resp: 18 18  Temp: 100 F (37.8 C) 98.9 F (37.2 C)  SpO2: 100% 97%   KPS:  80. General: Alert, cooperative, pleasant, in no acute distress Head: Normal EENT: No conjunctival injection or scleral icterus. Oral mucosa moist Lungs: Resp effort normal Cardiac: Regular rate and rhythm Abdomen: Soft, non-distended abdomen Skin: No rashes cyanosis or petechiae. Extremities: No clubbing or edema  NEUROLOGIC EXAM: Mental Status: Awake, alert, attentive to examiner. Oriented to self and environment. Language is fluent with intact comprehension.  Cranial Nerves: Visual acuity is grossly normal. Visual fields are full. Extra-ocular movements intact. No ptosis. Face is symmetric, tongue midline. Motor: Tone and bulk are normal.  Power is full in both arms and legs. Reflexes are symmetric, no pathologic reflexes present. Intact finger to nose bilaterally Sensory: Intact to light touch and temperature Gait: Deferred   LABORATORY DATA:  I have reviewed the data as listed Lab Results  Component Value Date   WBC 7.6 02/25/2020   HGB 12.6 02/25/2020   HCT 39.2 02/25/2020   MCV 88.7 02/25/2020   PLT 168 02/25/2020   Recent Labs    10/30/19 1026 10/30/19 1026 12/08/19 0959 02/24/20 1650 02/25/20 0610  NA 142  --  140 138 136  K 4.5   < > 4.7 3.5 3.3*  CL 104   < > 108 109 106  CO2 21  --   --  19* 21*  GLUCOSE 93  --  88 89 139*  BUN 14  --  '19 13 13  ' CREATININE 0.88   < > 0.80 1.27* 1.06*  CALCIUM 9.8  --   --  8.7* 8.5*  GFRNONAA 74  --   --  47* 59*  GFRAA 86  --   --  55* >60  PROT 6.9  --   --  6.9 6.5  ALBUMIN 4.2  --   --  3.2* 2.9*  AST 10  --   --  36 38  ALT 12  --   --  33 32  ALKPHOS 117  --   --  86 82  BILITOT 0.5  --   --  0.8 1.0   < > = values in this interval not displayed.    RADIOGRAPHIC STUDIES: I have personally reviewed the radiological images as listed and agreed with the findings in the report. DG Chest 2 View  Result Date: 02/24/2020 CLINICAL DATA:  Fever and headaches for 1 day. EXAM: CHEST - 2 VIEW COMPARISON:  05/09/2019  FINDINGS: The cardiac silhouette, mediastinal and hilar contours are normal. The lungs are clear. No pleural effusion or pulmonary lesions. The bony thorax is intact. IMPRESSION: No acute cardiopulmonary findings. Electronically Signed   By: Marijo Sanes M.D.   On: 02/24/2020 15:52   CT Head Wo Contrast  Result Date: 02/24/2020 CLINICAL DATA:  Headache.  Worsening headache. EXAM: CT HEAD WITHOUT CONTRAST TECHNIQUE: Contiguous axial images were obtained from the base of the skull through the vertex without intravenous contrast. COMPARISON:  None. FINDINGS: Brain: No acute intracranial hemorrhage. High-density rounded lesion within the RIGHT midbrain measuring 10 mm by 8 mm (image 10/series 2) in the region the quadrigeminal plate. No CT evidence of acute infarction. No midline shift or mass effect. No hydrocephalus. Basilar cisterns are patent. Vascular: No hyperdense vessel or unexpected calcification. Skull: Normal. Negative for fracture or focal lesion. Sinuses/Orbits: Paranasal sinuses and mastoid air cells are clear. Orbits are clear. Other: None. IMPRESSION: 1. High-density lesion in the RIGHT mid brain. Recommend MRI with and without contrast for further characterization. 2. No CT evidence of cortical infarction. No intracranial hemorrhage. Electronically Signed   By: Suzy Bouchard M.D.   On: 02/24/2020 16:53   MR BRAIN W WO CONTRAST  Result Date: 02/24/2020 CLINICAL DATA:  54 year old female presenting with worsening headache, subtle hyperdense CT lesion in the right midbrain. EXAM: MRI HEAD WITHOUT AND WITH CONTRAST TECHNIQUE: Multiplanar, multiecho pulse sequences of the brain and surrounding structures were obtained without and with intravenous contrast. CONTRAST:  30m GADAVIST GADOBUTROL 1 MMOL/ML IV SOLN COMPARISON:  Head CT earlier today, 05/09/2019, and earlier. FINDINGS: Brain: Round T2 and FLAIR hyperintense, T1 hypointense, mass  in the dorsal right midbrain measures up to 17 mm diameter  (series 11, image 12), with heterogeneously facilitated diffusion (series 6, image 19). Associated mass effect on the cerebral aqueduct which is mildly displaced posteriorly and to the left (series 16, image 22), although there is no ventriculomegaly or transependymal edema. There is no regional brainstem edema. And furthermore, this lesion was subtle but present on the CT in August 2020. Following contrast the lesion demonstrates fairly homogeneous central enhancement, with a surrounding T1 hypointense and T2 hyperintense rim (series 25, image 12). No vascular pulsation artifact or definite continuity with a regional vessel is identified. And the lesion does appear to be intra-axial as demonstrated by expansion of the dorsal right midbrain on both series 10, image 12 and series 9, image 12. No other abnormal intracranial enhancement or similar brain lesion elsewhere. Background brain volume appears normal for age. No restricted diffusion to suggest acute infarction. No midline shift, extra-axial fluid collection or acute intracranial hemorrhage. Cervicomedullary junction and pituitary are within normal limits. Vascular: Major intracranial vascular flow voids are preserved. Skull and upper cervical spine: Negative visible cervical spine. Visualized bone marrow signal is within normal limits. Sinuses/Orbits: Disconjugate gaze, otherwise negative orbits. Paranasal Visualized paranasal sinuses and mastoids are stable and well pneumatized. Other: Scalp and face soft tissues appear negative. IMPRESSION: 1. 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 This does appear intra-axial although is without brainstem edema, and also without ventriculomegaly despite mass effect on the cerebral aqueduct. The constellation is atypical for a high-grade glioma, solitary metastasis, or infection. But there are no secondary features of a high flow  vascular malformation or an infarct. Other considerations would include more indolent neoplasm (such as low-grade glioma, ganglioglioma hemangioblastoma, etc) and an inflammatory lesion such as Neurosarcoidosis. 2. Recommend Neurosurgery consultation. Electronically Signed   By: Genevie Ann M.D.   On: 02/24/2020 18:56    ASSESSMENT & PLAN:  Midbrain Lesion  Kathleen Stone presents with clinical and radiographic syndrome localizing to dorsal midbrain.  Etiology is uncertain, likely either neoplasm or inflammatory.  Do not suspect infectious process.  Recommend further workup: -CT chest, abdomen pelvis with enhancement to rule out or identify primary neoplasm, if metastatic -If primary is identified, systemic site should be biopsied given difficulty and risk of biopsy in brainstem -If negative, obtain lumbar puncture for routine studies, cytology, ACE level, oligoclonal bands -Should obtain general inflammatory serologies such as ESR, CRP, ANA, etc. also HIV status.  Will continue to follow, case will be presented and discussed in brain/spine tumor board on 02/29/20.  All questions were answered. The patient knows to call the clinic with any problems, questions or concerns.  The total time spent in the encounter was 55 minutes and more than 50% was on counseling and review of test results     Ventura Sellers, MD 02/25/2020 5:24 PM

## 2020-02-25 NOTE — Hospital Course (Signed)
Mentions feeling pain all over after her fall. States it was from the toilet to the shower. Denies any loss of consciousness. Mentions 3 day history of night-time chills. Headache worse with laying down. Improved with standing up. Mentions hx of right shoulder pain. She was scheduled to get scan today. Denies any unintentional weight loss.

## 2020-02-25 NOTE — ED Notes (Signed)
Lunch Tray Ordered @ 1022. 

## 2020-02-25 NOTE — Addendum Note (Signed)
Addended by: Lalla Brothers T on: 02/25/2020 09:04 AM   Modules accepted: Level of Service

## 2020-02-26 ENCOUNTER — Observation Stay (HOSPITAL_COMMUNITY): Payer: BC Managed Care – PPO

## 2020-02-26 ENCOUNTER — Inpatient Hospital Stay (HOSPITAL_COMMUNITY): Payer: BC Managed Care – PPO

## 2020-02-26 ENCOUNTER — Encounter (HOSPITAL_COMMUNITY): Payer: Self-pay | Admitting: Internal Medicine

## 2020-02-26 DIAGNOSIS — E876 Hypokalemia: Secondary | ICD-10-CM | POA: Diagnosis not present

## 2020-02-26 DIAGNOSIS — G939 Disorder of brain, unspecified: Secondary | ICD-10-CM | POA: Diagnosis not present

## 2020-02-26 DIAGNOSIS — F329 Major depressive disorder, single episode, unspecified: Secondary | ICD-10-CM | POA: Diagnosis present

## 2020-02-26 DIAGNOSIS — R519 Headache, unspecified: Secondary | ICD-10-CM | POA: Diagnosis not present

## 2020-02-26 DIAGNOSIS — Z801 Family history of malignant neoplasm of trachea, bronchus and lung: Secondary | ICD-10-CM | POA: Diagnosis not present

## 2020-02-26 DIAGNOSIS — L299 Pruritus, unspecified: Secondary | ICD-10-CM | POA: Diagnosis not present

## 2020-02-26 DIAGNOSIS — G9389 Other specified disorders of brain: Secondary | ICD-10-CM | POA: Diagnosis not present

## 2020-02-26 DIAGNOSIS — Z833 Family history of diabetes mellitus: Secondary | ICD-10-CM | POA: Diagnosis not present

## 2020-02-26 DIAGNOSIS — Z86010 Personal history of colonic polyps: Secondary | ICD-10-CM | POA: Diagnosis not present

## 2020-02-26 DIAGNOSIS — L298 Other pruritus: Secondary | ICD-10-CM | POA: Diagnosis not present

## 2020-02-26 DIAGNOSIS — N200 Calculus of kidney: Secondary | ICD-10-CM | POA: Diagnosis not present

## 2020-02-26 DIAGNOSIS — Z8249 Family history of ischemic heart disease and other diseases of the circulatory system: Secondary | ICD-10-CM | POA: Diagnosis not present

## 2020-02-26 DIAGNOSIS — Z20822 Contact with and (suspected) exposure to covid-19: Secondary | ICD-10-CM | POA: Diagnosis present

## 2020-02-26 DIAGNOSIS — Z79899 Other long term (current) drug therapy: Secondary | ICD-10-CM | POA: Diagnosis not present

## 2020-02-26 DIAGNOSIS — D496 Neoplasm of unspecified behavior of brain: Secondary | ICD-10-CM | POA: Diagnosis not present

## 2020-02-26 DIAGNOSIS — Z791 Long term (current) use of non-steroidal anti-inflammatories (NSAID): Secondary | ICD-10-CM | POA: Diagnosis not present

## 2020-02-26 DIAGNOSIS — E785 Hyperlipidemia, unspecified: Secondary | ICD-10-CM | POA: Diagnosis present

## 2020-02-26 DIAGNOSIS — Z800 Family history of malignant neoplasm of digestive organs: Secondary | ICD-10-CM | POA: Diagnosis not present

## 2020-02-26 DIAGNOSIS — E669 Obesity, unspecified: Secondary | ICD-10-CM | POA: Diagnosis present

## 2020-02-26 DIAGNOSIS — C719 Malignant neoplasm of brain, unspecified: Secondary | ICD-10-CM | POA: Diagnosis present

## 2020-02-26 DIAGNOSIS — N179 Acute kidney failure, unspecified: Secondary | ICD-10-CM | POA: Diagnosis present

## 2020-02-26 DIAGNOSIS — D331 Benign neoplasm of brain, infratentorial: Secondary | ICD-10-CM | POA: Diagnosis present

## 2020-02-26 DIAGNOSIS — I10 Essential (primary) hypertension: Secondary | ICD-10-CM | POA: Diagnosis present

## 2020-02-26 DIAGNOSIS — Z803 Family history of malignant neoplasm of breast: Secondary | ICD-10-CM | POA: Diagnosis not present

## 2020-02-26 DIAGNOSIS — Z6841 Body Mass Index (BMI) 40.0 and over, adult: Secondary | ICD-10-CM | POA: Diagnosis not present

## 2020-02-26 DIAGNOSIS — R3129 Other microscopic hematuria: Secondary | ICD-10-CM | POA: Diagnosis present

## 2020-02-26 DIAGNOSIS — G43909 Migraine, unspecified, not intractable, without status migrainosus: Secondary | ICD-10-CM | POA: Diagnosis present

## 2020-02-26 DIAGNOSIS — Z91041 Radiographic dye allergy status: Secondary | ICD-10-CM | POA: Diagnosis not present

## 2020-02-26 DIAGNOSIS — F17210 Nicotine dependence, cigarettes, uncomplicated: Secondary | ICD-10-CM | POA: Diagnosis present

## 2020-02-26 DIAGNOSIS — D259 Leiomyoma of uterus, unspecified: Secondary | ICD-10-CM | POA: Diagnosis not present

## 2020-02-26 LAB — BASIC METABOLIC PANEL
Anion gap: 8 (ref 5–15)
BUN: 10 mg/dL (ref 6–20)
CO2: 25 mmol/L (ref 22–32)
Calcium: 8.9 mg/dL (ref 8.9–10.3)
Chloride: 108 mmol/L (ref 98–111)
Creatinine, Ser: 0.81 mg/dL (ref 0.44–1.00)
GFR calc Af Amer: >60 mL/min (ref >60)
GFR calc non Af Amer: >60 mL/min (ref >60)
Glucose, Bld: 188 mg/dL — ABNORMAL HIGH (ref 70–99)
Potassium: 4.2 mmol/L (ref 3.5–5.1)
Sodium: 141 mmol/L (ref 135–145)

## 2020-02-26 LAB — CBC
HCT: 41.7 % (ref 36.0–46.0)
Hemoglobin: 13.5 g/dL (ref 12.0–15.0)
MCH: 28.5 pg (ref 26.0–34.0)
MCHC: 32.4 g/dL (ref 30.0–36.0)
MCV: 88 fL (ref 80.0–100.0)
Platelets: 189 10*3/uL (ref 150–400)
RBC: 4.74 MIL/uL (ref 3.87–5.11)
RDW: 14.7 % (ref 11.5–15.5)
WBC: 5 10*3/uL (ref 4.0–10.5)
nRBC: 0 % (ref 0.0–0.2)

## 2020-02-26 LAB — PROTEIN AND GLUCOSE, CSF
Glucose, CSF: 97 mg/dL — ABNORMAL HIGH (ref 40–70)
Total  Protein, CSF: 42 mg/dL (ref 15–45)

## 2020-02-26 LAB — CSF CELL COUNT WITH DIFFERENTIAL
RBC Count, CSF: 55 /mm3 — ABNORMAL HIGH
Tube #: 3
WBC, CSF: 1 /mm3 (ref 0–5)

## 2020-02-26 LAB — SEDIMENTATION RATE: Sed Rate: 46 mm/hr — ABNORMAL HIGH (ref 0–22)

## 2020-02-26 LAB — LACTATE DEHYDROGENASE: LDH: 307 U/L — ABNORMAL HIGH (ref 98–192)

## 2020-02-26 LAB — C-REACTIVE PROTEIN: CRP: 10 mg/dL — ABNORMAL HIGH (ref <1.0)

## 2020-02-26 MED ORDER — LOSARTAN POTASSIUM 50 MG PO TABS
50.0000 mg | ORAL_TABLET | Freq: Every day | ORAL | Status: DC
  Administered 2020-02-27 – 2020-02-29 (×3): 50 mg via ORAL
  Filled 2020-02-26 (×3): qty 1

## 2020-02-26 MED ORDER — IOHEXOL 300 MG/ML  SOLN
75.0000 mL | Freq: Once | INTRAMUSCULAR | Status: AC | PRN
  Administered 2020-02-26: 75 mL via INTRAVENOUS

## 2020-02-26 MED ORDER — POTASSIUM CHLORIDE 20 MEQ PO PACK
40.0000 meq | PACK | Freq: Once | ORAL | Status: AC
  Administered 2020-02-26: 40 meq via ORAL
  Filled 2020-02-26: qty 2

## 2020-02-26 MED ORDER — LIDOCAINE HCL (PF) 1 % IJ SOLN: 3.0000 mL | Freq: Once | INTRAMUSCULAR | Status: DC

## 2020-02-26 NOTE — Procedures (Signed)
Fluoro guided LP performed at L3-L4.  Opening pressure 13 cc H2O.  16 ml clear CSF obtained.  No complications.  Please see dictation in PACS for full details of procedures.    DE

## 2020-02-26 NOTE — Progress Notes (Signed)
   Subjective: Seen at bedside today. Pt states that her pain medication is managing her headache. Denies fevers, chills, CP, SOB, dizziness, pre-syncope, nausea, and vomiting. Reports last BM was last night and was loose.  Objective:  Vital signs in last 24 hours: Vitals:   02/25/20 1210 02/25/20 1700 02/26/20 0023 02/26/20 0607  BP: (!) 136/55 (!) 141/62 (!) 151/64 (!) 145/65  Pulse: 60 66 62 (!) 50  Resp: '18 18 16 16  '$ Temp: 100 F (37.8 C) 98.9 F (37.2 C) 98.1 F (36.7 C) (!) 97.4 F (36.3 C)  TempSrc: Oral Oral Oral Oral  SpO2: 100% 97% 97% 100%  Weight:      Height:       General: Pt is in NAD HEENT: [place]/AT. See neuro exam for eye findings. Cardiac: RRR. No murmurs, rubs, or gallops Respiratory: Lungs are CTA BL. No wheezes, rales, or rhonchi Abdominal: Abdomen is soft and non-tender with normoactive bowel sounds Skin: Warm and dry. MSK: spontaneously moving all extremities. Can lift BL legs off the bed. Hand grip strength normal BL Neuro: CN II, III, IV, VI: EOMI intact. Pupils equally round and reactive to light. No ptosis. Pt winces in response to light being shone in her eyes. CN V: Facial sensation is the same BL CN VII: No facial asymmetry noted CN VIII: hearing is intact to voice BL CN IX, X: Palate is elevated symmetrically. Phonation is normal CN XI: Head turning intact BL with mild weakness on the left side CN XII: Tongue is midline with normal movements and no atrophy.  Assessment/Plan:  Principal Problem:   Brain lesion Active Problems:   Essential hypertension   Hematuria, microscopic  Pt is a 54-yr-old woman with PMH significant for HTN, tobacco use disorder, and depression who presented to the hospital with a 4-day history of severe headache and was found to have a dorsal right midbrain mass measuring up to 17 mm in diameter on MRI. Headaches are responding to pain medication today.  Headache Midbrain lesion: Pt denies headache this morning but  winces whenever light hit her eyes, suggesting photophobia. Last temperature 100 F, WBC WNL at 5.0. HIV Ab negative. Per neurosurgery note, midbrain lesion more concerning for inflammatory process vs malignancy, less likely infectious given that pt is afebrile with normal WBC. CT scans of the chest and abdomen performed today and showed no malignancy in the chest, abdomen, or pelvis. LP recommended given negative CT scan findings. ESR and CRP ordered to assess for inflammatory condition. -We appreciate neuro-oncology's recommendations -Follow up LP results -Follow up ESR and CRP  Hypokalemia: K was 3.3 today, later 4.2. KCl added to normalize K  HTN: -Metoprolol 25 mg PO BID  -Continue home losartan 50 mg PO QD  HLD: -Rosuvastatin 40 mg PO QD  Diet: -Heart healthy  DVT prophylaxis: -SCDs given unknown brain mass  CODE STATUS:FULL  Prior to Admission Living Arrangement: Home Anticipated Discharge Location: Home Barriers to Discharge: None Dispo: Anticipated discharge in greater than 2 day(s).   Josepha Pigg, Medical Student 02/26/2020, 10:50 AM Pager: [phone] After 5pm on weekdays and 1pm on weekends: On Call pager [phone]

## 2020-02-27 LAB — BASIC METABOLIC PANEL
Anion gap: 10 (ref 5–15)
BUN: 13 mg/dL (ref 6–20)
CO2: 23 mmol/L (ref 22–32)
Calcium: 8.7 mg/dL — ABNORMAL LOW (ref 8.9–10.3)
Chloride: 109 mmol/L (ref 98–111)
Creatinine, Ser: 0.86 mg/dL (ref 0.44–1.00)
GFR calc Af Amer: >60 mL/min (ref >60)
GFR calc non Af Amer: >60 mL/min (ref >60)
Glucose, Bld: 195 mg/dL — ABNORMAL HIGH (ref 70–99)
Potassium: 3.7 mmol/L (ref 3.5–5.1)
Sodium: 142 mmol/L (ref 135–145)

## 2020-02-27 LAB — CBC
HCT: 39.9 % (ref 36.0–46.0)
Hemoglobin: 12.9 g/dL (ref 12.0–15.0)
MCH: 28.4 pg (ref 26.0–34.0)
MCHC: 32.3 g/dL (ref 30.0–36.0)
MCV: 87.9 fL (ref 80.0–100.0)
Platelets: 192 10*3/uL (ref 150–400)
RBC: 4.54 MIL/uL (ref 3.87–5.11)
RDW: 14.8 % (ref 11.5–15.5)
WBC: 12.8 10*3/uL — ABNORMAL HIGH (ref 4.0–10.5)
nRBC: 0 % (ref 0.0–0.2)

## 2020-02-27 MED ORDER — ENOXAPARIN SODIUM 40 MG/0.4ML ~~LOC~~ SOLN
40.0000 mg | SUBCUTANEOUS | Status: DC
  Filled 2020-02-27: qty 0.4

## 2020-02-27 MED ORDER — DIPHENHYDRAMINE HCL 25 MG PO CAPS
25.0000 mg | ORAL_CAPSULE | Freq: Three times a day (TID) | ORAL | Status: DC | PRN
  Administered 2020-02-27 – 2020-02-28 (×2): 25 mg via ORAL
  Filled 2020-02-27 (×2): qty 1

## 2020-02-27 NOTE — Progress Notes (Addendum)
   Subjective: Seen at bedside today. Pt feels well overall. Reports itching in arms and legs, attributes it to iodine. Denies headache, fevers, chills, N/V/D, light-headedness, and pre-syncope.  Objective:  Vital signs in last 24 hours: Vitals:   02/26/20 1824 02/26/20 2200 02/27/20 0012 02/27/20 0625  BP: (!) 126/52 (!) 147/53 (!) 137/52 133/63  Pulse: 70  (!) 56 (!) 51  Resp: '18  17 16  '$ Temp: 98.4 F (36.9 C)  98.1 F (36.7 C) 97.6 F (36.4 C)  TempSrc: Oral  Oral Oral  SpO2: 100%  99% 98%  Weight:      Height:       General: Pt is in NAD HEENT: [place]/AT. See neuro exam for eye findings. Cardiac: RRR. No murmurs, rubs, or gallops Respiratory: Lungs are CTA BL. No wheezes, rales, or rhonchi Abdominal: Abdomen is soft and non-tender with normoactive bowel sounds Skin: Warm and dry. MSK: spontaneously moving all extremities. Can lift BL legs off the bed against resistance Neuro: Awake, alert, and responding to questions. No neck tenderness CN II, III, IV, VI: EOMI intact. Pupils equally round and reactive to light. No ptosis. Pt winces in response to light being shone in her eyes. CN V: Facial sensation is the same BL CN VII: No facial asymmetry noted CN VIII: Hearing is intact to voice BL CN IX, X: Palate is elevated symmetrically. Phonation is normal CN XI: Head turning grossly normal CN XII: Tongue is midline with normal movements and no atrophy.  Assessment/Plan:  Principal Problem:   Brain lesion Active Problems:   Essential hypertension   Hematuria, microscopic   Brain neoplasm (HCC)  Pt is a 3-yr-oldwomanwith PMHsignificant for HTN, tobacco use disorder, and depression who presented to the hospital witha 4-day history of severeheadacheand was found to havea dorsal right midbrain mass measuring up to 17 mm in diameteron MRI. Pt denies headache today.  Headache Midbrain lesion: Pt denies headache this morning.Last temperature 98.4 F. ESR 46, CRP 10.0, and  LDH 307, all elevated and suggestive of inflammation. Pan-CT returned negative, so LP performed yesterday. Spinal fluid showed elevated glucose of 97 and elevated RBC count of 55, otherwise normal. LP Gram stain returned negative for microorganisms, culture pending. -We appreciate neuro-oncology's recommendations -Follow up LP results  HTN: -Metoprolol25 mg PO BID  -Losartan 50 mg PO QD  HLD: -Rosuvastatin 40 mg PO QD  Diet: -Hearthealthy  DVTprophylaxis: -SCDs given unknown brain mass  CODE STATUS:FULL  Prior to Admission Living Arrangement: Home Anticipated Discharge Location: Home Barriers to Discharge: None Dispo: Anticipated discharge in greater than 2 day(s).   Josepha Pigg, Medical Student 02/27/2020, 7:35 AM Pager: [phone] After 5pm on weekdays and 1pm on weekends: On Call pager [phone]  Attestation for Student Documentation I personally was present and performed or re-performed the history, physical exam and medical decision-making activities of this service and have verified that the service and findings are accurately documented in the student's note  Ms.Suleiman was examined and evaluated with presence of MD. CT results were negative for alternative primary source. Initial labs from lumbar puncture unremarkable. Awaiting tumor board decision on 02/29/20 on how to proceed. Will change DVT prophylaxis to Lovenox as she completed her procedure.  -Gilberto Better, PGY2 Pager: [phone] After 5pm on weekdays and 1pm on weekends: On Call pager [phone]

## 2020-02-28 LAB — BASIC METABOLIC PANEL
Anion gap: 10 (ref 5–15)
BUN: 12 mg/dL (ref 6–20)
CO2: 24 mmol/L (ref 22–32)
Calcium: 8.4 mg/dL — ABNORMAL LOW (ref 8.9–10.3)
Chloride: 105 mmol/L (ref 98–111)
Creatinine, Ser: 1.01 mg/dL — ABNORMAL HIGH (ref 0.44–1.00)
GFR calc Af Amer: >60 mL/min (ref >60)
GFR calc non Af Amer: >60 mL/min (ref >60)
Glucose, Bld: 90 mg/dL (ref 70–99)
Potassium: 3.8 mmol/L (ref 3.5–5.1)
Sodium: 139 mmol/L (ref 135–145)

## 2020-02-28 LAB — CBC
HCT: 41.7 % (ref 36.0–46.0)
Hemoglobin: 13.4 g/dL (ref 12.0–15.0)
MCH: 28 pg (ref 26.0–34.0)
MCHC: 32.1 g/dL (ref 30.0–36.0)
MCV: 87.1 fL (ref 80.0–100.0)
Platelets: 238 10*3/uL (ref 150–400)
RBC: 4.79 MIL/uL (ref 3.87–5.11)
RDW: 14.9 % (ref 11.5–15.5)
WBC: 8.8 10*3/uL (ref 4.0–10.5)
nRBC: 0 % (ref 0.0–0.2)

## 2020-02-28 MED ORDER — OXYCODONE-ACETAMINOPHEN 5-325 MG PO TABS
1.0000 | ORAL_TABLET | Freq: Once | ORAL | Status: AC
  Administered 2020-02-28: 2 via ORAL
  Filled 2020-02-28: qty 2

## 2020-02-28 MED ORDER — HYDROXYZINE HCL 50 MG/ML IM SOLN
25.0000 mg | Freq: Once | INTRAMUSCULAR | Status: AC
  Administered 2020-02-28: 25 mg via INTRAMUSCULAR
  Filled 2020-02-28: qty 0.5

## 2020-02-28 MED ORDER — HYDROXYZINE HCL 50 MG/ML IM SOLN: 25.0000 mg | Freq: Four times a day (QID) | INTRAMUSCULAR | Status: DC | PRN

## 2020-02-28 MED ORDER — HYDROXYZINE HCL 10 MG PO TABS: 10.0000 mg | ORAL_TABLET | Freq: Three times a day (TID) | ORAL | Status: DC | PRN

## 2020-02-28 MED ORDER — SODIUM CHLORIDE 0.9 % IV SOLN: INTRAVENOUS | Status: AC

## 2020-02-28 NOTE — Progress Notes (Signed)
Patient has declined lovenox, but did agree to [employer]. Placed on patient and bed alarm on.

## 2020-02-28 NOTE — Progress Notes (Signed)
Peripheral IV infiltrated. MD Truman Hayward aware and okay with patient having no access at this time.

## 2020-02-28 NOTE — Progress Notes (Signed)
° °  Subjective:  Kathleen Stone is a 54 y.o. with PMH of htn, tobacco use admit for headaches on hospital day 2  Kathleen Stone was seen and examined at the bedside this morning. Pt endorses continued headaches, may be decreasing in frequency. Mentions some residual itching, "under the skin." Discussed plan to await CSF results and tumor board meeting tomorrow. Pt agreeable to staying inpatient for this. All questions and concerns addressed.  Objective:  Vital signs in last 24 hours: Vitals:   02/27/20 2137 02/27/20 2330 02/28/20 0521 02/28/20 0914  BP: 134/66 128/60 127/60 105/69  Pulse: 67 65 66 67  Resp: 18 18 16    Temp: 98 F (36.7 C) 98.8 F (37.1 C) 98.9 F (37.2 C)   TempSrc: Oral Oral Oral   SpO2: 95% 96% 97%   Weight:      Height:       Gen: Well-developed, well nourished, NAD HEENT: NCAT head, hearing intact, EOMI, MMM CV: RRR, S1, S2 normal, No rubs, no murmurs, no gallops Pulm: CTAB, No rales, no wheezes, no dullness to percussion  Abd: Soft, BS+, NTND, No rebound, no guarding Extm: ROM intact, Peripheral pulses intact, No peripheral edema Skin: Dry, Warm, No Hives Neuro: AAOx3, no peripheral weakness  Assessment/Plan:  Principal Problem:   Brain lesion Active Problems:   Essential hypertension   Hematuria, microscopic   Brain neoplasm (HCC)  Kathleen Stone is a 3-yr-oldwomanwith PMHsignificant for HTN, tobacco use disorder, and depression admit for headaches 2/2 brain mass.  Headache Midbrain lesion Dorsal midbrain lesion 2/2 malignancy vs neurosarcoidosis. Inflammatory markers elevated. Initial LP labs w/o significant findings. Cytology pending. CT chest/abd/pelvis w/o alternative primary source. Tumor board planned for 02/29/20 - F/u LP labs - F/u tumor board decision for further work-up - Monitor for mass effect - Tylenol prn for headaches  Contrast-induced pruritus Has allergy to contrast. Pre-loaded w/ steroids and anti-histamines. Continues to endorse  pruritus despite bendaryl. Will trial alternative anti-histamine - D/c benadryl, Hydoxyzine prn  HTN Am bp 127/60 - C/w losartan 50mg , metoprolol 25mg  BID  Prior to Admission Living Arrangement: Home Anticipated Discharge Location: Home Barriers to Discharge: Continued medical work-up Dispo: Anticipated discharge in approximately 1-2 day(s).   DVT prophx: Lovenox Diet: Regular Bowel: N/A Code: Full  Mosetta Anis, MD 02/28/2020, 9:40 AM Pager: [phone] After 5pm on weekdays and 1pm on weekends: On Call pager [phone]

## 2020-02-29 DIAGNOSIS — I10 Essential (primary) hypertension: Secondary | ICD-10-CM

## 2020-02-29 DIAGNOSIS — L298 Other pruritus: Secondary | ICD-10-CM

## 2020-02-29 LAB — CBC
HCT: 41.3 % (ref 36.0–46.0)
Hemoglobin: 13.4 g/dL (ref 12.0–15.0)
MCH: 28.3 pg (ref 26.0–34.0)
MCHC: 32.4 g/dL (ref 30.0–36.0)
MCV: 87.3 fL (ref 80.0–100.0)
Platelets: 256 10*3/uL (ref 150–400)
RBC: 4.73 MIL/uL (ref 3.87–5.11)
RDW: 14.7 % (ref 11.5–15.5)
WBC: 9 10*3/uL (ref 4.0–10.5)
nRBC: 0 % (ref 0.0–0.2)

## 2020-02-29 LAB — BASIC METABOLIC PANEL
Anion gap: 7 (ref 5–15)
BUN: 15 mg/dL (ref 6–20)
CO2: 25 mmol/L (ref 22–32)
Calcium: 8.6 mg/dL — ABNORMAL LOW (ref 8.9–10.3)
Chloride: 107 mmol/L (ref 98–111)
Creatinine, Ser: 0.98 mg/dL (ref 0.44–1.00)
GFR calc Af Amer: >60 mL/min (ref >60)
GFR calc non Af Amer: >60 mL/min (ref >60)
Glucose, Bld: 140 mg/dL — ABNORMAL HIGH (ref 70–99)
Potassium: 3.4 mmol/L — ABNORMAL LOW (ref 3.5–5.1)
Sodium: 139 mmol/L (ref 135–145)

## 2020-02-29 LAB — CULTURE, BLOOD (ROUTINE X 2)
Culture: NO GROWTH
Culture: NO GROWTH
Special Requests: ADEQUATE

## 2020-02-29 LAB — ANGIOTENSIN CONVERTING ENZYME, CSF
Angio Convert Enzyme: <1.5 U/L (ref 0.0–3.1)
Angio Convert Enzyme: <1.5 U/L (ref 0.0–3.1)

## 2020-02-29 LAB — CYTOLOGY - NON PAP

## 2020-02-29 LAB — MAGNESIUM: Magnesium: 2 mg/dL (ref 1.7–2.4)

## 2020-02-29 MED ORDER — OXYCODONE-ACETAMINOPHEN 5-325 MG PO TABS: 1.0000 | ORAL_TABLET | Freq: Three times a day (TID) | ORAL | Status: DC | PRN

## 2020-02-29 MED ORDER — POTASSIUM CHLORIDE CRYS ER 20 MEQ PO TBCR
40.0000 meq | EXTENDED_RELEASE_TABLET | Freq: Once | ORAL | Status: AC
  Administered 2020-02-29: 40 meq via ORAL
  Filled 2020-02-29: qty 2

## 2020-02-29 MED ORDER — ACETAMINOPHEN 325 MG PO TABS: 650.0000 mg | ORAL_TABLET | Freq: Four times a day (QID) | ORAL | 2 refills | Status: AC | PRN

## 2020-02-29 MED ORDER — OXYCODONE-ACETAMINOPHEN 5-325 MG PO TABS: 1.0000 | ORAL_TABLET | Freq: Three times a day (TID) | ORAL | 0 refills | Status: AC | PRN

## 2020-02-29 NOTE — Progress Notes (Signed)
° °  Subjective: HD 5 Overnight, no acute events reported.   [name] was evaluated at bedside this morning. Patient endorses improvement in her headaches. She notes itching is also improved. Vision is currently stable and she has been able to ambulate with her wife. Patient's sister, Kathleen Stone, was also on the phone. Discussed plan to f/u with tumor board recommendations and CSF cytology results.   Objective:  Vital signs in last 24 hours: Vitals:   02/28/20 1201 02/28/20 1758 02/28/20 1925 02/29/20 0441  BP: (!) 142/71 (!) 134/53 130/63 (!) 128/48  Pulse: 63 65 61 65  Resp: 17 19 20 20   Temp: 98.2 F (36.8 C) 97.6 F (36.4 C) 97.6 F (36.4 C) 97.8 F (36.6 C)  TempSrc: Oral Oral Oral Oral  SpO2: 100% 98% 97% 100%  Weight:      Height:       CBC Latest Ref Rng & Units 02/28/2020 02/27/2020 02/26/2020  WBC 4.0 - 10.5 K/uL 8.8 12.8(H) 5.0  Hemoglobin 12.0 - 15.0 g/dL 13.4 12.9 13.5  Hematocrit 36 - 46 % 41.7 39.9 41.7  Platelets 150 - 400 K/uL 238 192 189   BMP Latest Ref Rng & Units 02/28/2020 02/27/2020 02/26/2020  Glucose 70 - 99 mg/dL 90 195(H) 188(H)  BUN 6 - 20 mg/dL 12 13 10   Creatinine 0.44 - 1.00 mg/dL 1.01(H) 0.86 0.81  BUN/Creat Ratio 9 - 23 - - -  Sodium 135 - 145 mmol/L 139 142 141  Potassium 3.5 - 5.1 mmol/L 3.8 3.7 4.2  Chloride 98 - 111 mmol/L 105 109 108  CO2 22 - 32 mmol/L 24 23 25   Calcium 8.9 - 10.3 mg/dL 8.4(L) 8.7(L) 8.9   Physical Exam  Constitutional: Appears well-developed and well-nourished. No distress.  Head: Normocephalic and atraumatic.  Eyes: Conjunctivae are normal. EOMI Cardiovascular: Normal rate, regular rhythm and normal heart sounds.  Respiratory: Effort normal and breath sounds normal. No respiratory distress. No wheezes.  GI: Soft. Bowel sounds are normal. No distension. There is no tenderness.  Neurological: Is alert and oriented x4, no apparent focal neurologic deficits Skin: Not diaphoretic. No erythema or hives noted    Psychiatric: Normal mood and affect. Behavior is normal. Judgment and thought content normal.   Assessment/Plan: Kathleen Stone is a 65-yr-oldwomanwith PMHsignificant for HTN, tobacco use disorder, and depression admit for headaches 2/2 brain mass.  Headache Midbrain mass Patient presented with progressively worsening headaches, vision changes and gait instability noted to have a dorsal midbrain lesion concerning or malignancy vs neurosarcoidosis. CT chest/abdomen/pelvis without alternative primary source. Inflammatory markers elevated. Initial LP labs appear non-infectious in etiology. Cytology pending. Headaches currently well-controlled.  - Neuro-oncology consulted, appreciate their recommendations - F/u tumor board recommendations - Monitor for mass effect - Tylenol 650mg  q6h prn + oxycodone-acetaminophen 5-325mg  1-2 tablets q8h prn for breakthrough pain   Contrast-induced pruritis Improved with hydroxyzine.  - Continue hydroxyzine prn   Hypertension: Currently well controlled with losartan 50mg  dialy and metoprolol 25mg  bid   Prior to Admission Living Arrangement: Home Anticipated Discharge Location: Home Barriers to Discharge: Continued medical management  Dispo: Anticipated discharge in approximately 1-2 day(s).   Kathleen Heck, MD  Internal Medicine, PGY-1 02/29/2020, 6:54 AM Pager: [phone] After 5pm on weekdays and 1pm on weekends: On Call pager [phone]

## 2020-02-29 NOTE — Plan of Care (Signed)
Pt and family understanding of discharge instructions  

## 2020-02-29 NOTE — Discharge Summary (Addendum)
Name: Kathleen Stone MRN: [id number] DOB: 1966/02/08 54 y.o. PCP: [name], MD  Date of Admission: 02/24/2020 11:39 AM Date of Discharge: 02/29/2020 Attending Physician: Sid Falcon, MD  Discharge Diagnosis: 1. 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 Hypertension 3. Contrast-induced pruritis   Discharge Medications: Allergies as of 02/29/2020      Reactions   Iohexol Hives, Itching   Benadryl 50mg  PO one hour before iodinated contrast studies.  jkl      Medication List    TAKE these medications   acetaminophen 325 MG tablet Commonly known as: TYLENOL Take 2 tablets (650 mg total) by mouth every 6 (six) hours as needed for mild pain, moderate pain, fever or headache.   gabapentin 300 MG capsule Commonly known as: NEURONTIN TAKE ONE CAPSULE BY MOUTH TWICE A DAY   losartan 50 MG tablet Commonly known as: COZAAR TAKE ONE TABLET BY MOUTH ONE TIME DAILY   meloxicam 7.5 MG tablet Commonly known as: Mobic Take 1-2 tablets (7.5-15 mg total) by mouth daily as needed for pain.   metoprolol tartrate 25 MG tablet Commonly known as: LOPRESSOR Take 1 tablet (25 mg total) by mouth 2 (two) times daily.   nitroGLYCERIN 0.4 MG SL tablet Commonly known as: NITROSTAT Place 1 tablet (0.4 mg total) under the tongue every 5 (five) minutes as needed for chest pain.   oxyCODONE-acetaminophen 5-325 MG tablet Commonly known as: PERCOCET/ROXICET Take 1-2 tablets by mouth every 8 (eight) hours as needed for up to 5 days for severe pain.   rosuvastatin 40 MG tablet Commonly known as: CRESTOR Take 1 tablet (40 mg total) by mouth daily.       Disposition and follow-up:   [name] was discharged from [facility] in Stable condition.  At the hospital follow up visit please address:  1.  Headache 2/2 midbrain mass: Patient with dorsal midbrain lesion concerning for malignancy vs neurosarcoidosis  - Neuro-oncology recommendation to repeat MRI brain  in 6-8 wks and f/u with biopsy - Pain control with tylenol and percocet prn for severe pain  Hypertension: - Continue losartan 50mg  daily and metoprolol 25mg  bid   2.  Labs / imaging needed at time of follow-up: MRI brain in 6-8 weeks  3.  Pending labs/ test needing follow-up: None   Follow-up Appointments:  Follow-up Information    [name], MD. Schedule an appointment as soon as possible for a visit in 1 week(s).   Specialty: Internal Medicine Contact information: [address] [address] [phone]        Ventura Sellers, MD. Schedule an appointment as soon as possible for a visit in 6 week(s).   Specialties: Psychiatry, Neurology, Oncology Contact information: [place] [address] [place] Hospital Course by problem list: 1. Headache 2/2 dorsal midbrain lesion Patient presented with four day history of progressively worsening nocturnal headache with gait instability resulting in falls for two weeks duration. She was found to have a 17mm solitary midbrain lesion without brainstem edema but with mass effect on cerebral aqueduct. Concerns for neolasm vs inflammatory process such as neurosarcoidosis. Neuro-oncology consult obtained and recommended for lumbar puncture. Patient did have elevated serum inflammatory markers but LP overall unremarkable for infectious etiology. LP cytology without any malignant cells identified. Patient observed in hospital while awaiting tumor board recommendations which may have recommended urgent intervention. She was  also observed for headache pain control due to uncertainty regarding tumor growth rate  with titration of oral pain medications. Patient discussed with tumor board and recommended for follow up in 6-8 weeks with neuro-oncology for repeat MRI brain and biopsy. She was discharged with continued tylenol and percocet prn.   2. Hypertension: Patient is on losartan 50mg   daily and metoprolol 25mg  bid for hypertension.   3. Contrast induced pruritis: Patient with known contrast allergy. Given prep with prednisone and benadryl prior to receiving contrast for CT Chest/Abdomen/Pelvis. However, continued to have pruritis despite receiving benadryl. Resolved with hydroxyzine.   Discharge Vitals:   BP (!) 123/53 (BP Location: Right Arm)   Pulse 62   Temp 97.7 F (36.5 C) (Oral)   Resp 16   Ht 5\' 6"  (1.676 m)   Wt 120.7 kg   LMP 02/07/2016 (Exact Date)   SpO2 97%   BMI 42.93 kg/m   Pertinent Labs, Studies, and Procedures:  CBC Latest Ref Rng & Units 02/29/2020 02/28/2020 02/27/2020  WBC 4.0 - 10.5 K/uL 9.0 8.8 12.8(H)  Hemoglobin 12.0 - 15.0 g/dL 13.4 13.4 12.9  Hematocrit 36 - 46 % 41.3 41.7 39.9  Platelets 150 - 400 K/uL 256 238 192   CMP Latest Ref Rng & Units 02/29/2020 02/28/2020 02/27/2020  Glucose 70 - 99 mg/dL 140(H) 90 195(H)  BUN 6 - 20 mg/dL 15 12 13   Creatinine 0.44 - 1.00 mg/dL 0.98 1.01(H) 0.86  Sodium 135 - 145 mmol/L 139 139 142  Potassium 3.5 - 5.1 mmol/L 3.4(L) 3.8 3.7  Chloride 98 - 111 mmol/L 107 105 109  CO2 22 - 32 mmol/L 25 24 23   Calcium 8.9 - 10.3 mg/dL 8.6(L) 8.4(L) 8.7(L)  Total Protein 6.5 - 8.1 g/dL - - -  Total Bilirubin 0.3 - 1.2 mg/dL - - -  Alkaline Phos 38 - 126 U/L - - -  AST 15 - 41 U/L - - -  ALT 0 - 44 U/L - - -   Urinalysis    Component Value Date/Time   COLORURINE YELLOW 02/24/2020 2155   APPEARANCEUR HAZY (A) 02/24/2020 2155   APPEARANCEUR Cloudy (A) 10/30/2019 0920   LABSPEC 1.029 02/24/2020 2155   PHURINE 5.0 02/24/2020 2155   GLUCOSEU NEGATIVE 02/24/2020 2155   GLUCOSEU NEG mg/dL 07/10/2007 2027   HGBUR MODERATE (A) 02/24/2020 2155   BILIRUBINUR NEGATIVE 02/24/2020 2155   BILIRUBINUR Negative 10/30/2019 0920   KETONESUR NEGATIVE 02/24/2020 2155   PROTEINUR 30 (A) 02/24/2020 2155   UROBILINOGEN 0.2 01/20/2013 1210   NITRITE NEGATIVE 02/24/2020 2155   LEUKOCYTESUR NEGATIVE 02/24/2020 2155    Specimen Description BLOOD LEFT HAND   Special Requests BOTTLES DRAWN AEROBIC AND ANAEROBIC Blood Culture adequate volume   Culture NO GROWTH 5 DAYS    HIV Screen 4th Generation wRfx Non Reactive Non Reactive    CRP <1.0 mg/dL 10.0High    Sed Rate 0 - 22 mm/hr 46High   LDH 98 - 192 U/L 307High    Tube #  3   Color, CSF COLORLESS COLORLESS   Appearance, CSF CLEAR CLEAR   Supernatant  NOT INDICATED   RBC Count, CSF 0 /cu mm 55High   WBC, CSF 0 - 5 /cu mm 1   Other Cells, CSF  TOO FEW TO COUNT, SMEAR AVAILABLE FOR REVIEW   Comment: RARE MONOCYTES, RARE LYMPHOCYTES    Glucose, CSF 40 - 70 mg/dL 97High   Total Protein, CSF 15 - 45 mg/dL 42    Angio  Convert Enzyme 0.0 - 3.1 U/L <1.5  <1.5 CM    Specimen Description CSF   Special Requests NONE   Gram Stain WBC PRESENT,BOTH PMN AND MONONUCLEAR  NO ORGANISMS SEEN  CYTOSPIN SMEAR   Culture NO GROWTH 3 DAYS    CYTOLOGY - NON PAP  CASE: [id number]  PATIENT: Yatzil Croghan  Non-Gynecological Cytology Report  Clinical History: None provided  Specimen Submitted: A. CEREBRAL SPINAL FLUID, SPINAL TAP:  FINAL MICROSCOPIC DIAGNOSIS:  - No malignant cells identified   CXR 02/24/2020: IMPRESSION: No acute cardiopulmonary findings.  CT HEAD WO CONTRAST 02/24/2020: IMPRESSION: 1. High-density lesion in the RIGHT mid brain. Recommend MRI with and without contrast for further characterization. 2. No CT evidence of cortical infarction. No intracranial Hemorrhage.  MRI BRAIN W WO CONTRAST 02/24/2020: IMPRESSION: 1. 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 This does appear intra-axial although is without brainstem edema, and also without ventriculomegaly despite mass effect on the cerebral aqueduct. The constellation is atypical for a high-grade glioma, solitary metastasis, or infection. But there are no secondary features of  a high flow vascular malformation or an infarct. Other considerations would include more indolent neoplasm (such as low-grade glioma, ganglioglioma hemangioblastoma, etc) and an inflammatory lesion such as Neurosarcoidosis. 2. Recommend Neurosurgery consultation.  CT CHEST/ABDOMEN/PELVIS 02/26/2020: IMPRESSION: 1. No evidence of malignancy in the chest, abdomen or pelvis. 2. Small, nonobstructing bilateral renal calculi. 3. Fibroid uterus.  Discharge Instructions: Discharge Instructions    Call MD for:  difficulty breathing, headache or visual disturbances   Complete by: As directed    Call MD for:  extreme fatigue   Complete by: As directed    Call MD for:  hives   Complete by: As directed    Call MD for:  persistant dizziness or light-headedness   Complete by: As directed    Call MD for:  persistant nausea and vomiting   Complete by: As directed    Call MD for:  severe uncontrolled pain   Complete by: As directed    Call MD for:  temperature >100.4   Complete by: As directed    Diet - low sodium heart healthy   Complete by: As directed    Discharge instructions   Complete by: As directed    Ms. Livingston,  You were admitted to the hospital with progressively worsening headaches. MRI brain was significant for mass that is concerning for malignancy vs inflammation. You had an LP and neuro-oncologist was consulted. At this time, your labs are more consistent with a slow-growing mass. According to tumor board recommendations, it appears that this mass has been present for several years. Recommend for repeat MRI brain in 6-8 weeks and will biopsy following that .You may return to work as tolerated.  If your symptoms worsen, please contact us at [phone]  Thank you!   Increase activity slowly   Complete by: As directed       Signed: Harvie Heck, MD  Internal Medicine, PGY-1 02/29/2020, 5:07 PM   Pager: [phone]

## 2020-02-29 NOTE — Progress Notes (Signed)
Kellie Simmering to be D/C'd Home per MD order.  Discussed with the patient and all questions fully answered.   VSS, Skin clean, dry and intact without evidence of skin break down, no evidence of skin tears noted. IV catheter discontinued intact. Site without signs and symptoms of complications. Dressing and pressure applied.   An After Visit Summary was printed and given to the patient.    D/C education completed with patient/family including follow up instructions, medication list, d/c activities limitations if indicated, with other d/c instructions as indicated by MD - patient able to verbalize understanding, all questions fully answered.    Patient instructed to return to ED, call 911, or call MD for any changes in condition.    Patient escorted via [place], and D/C home via Nurse, mental health.

## 2020-03-01 LAB — CSF CULTURE W GRAM STAIN: Culture: NO GROWTH

## 2020-03-02 ENCOUNTER — Telehealth: Payer: Self-pay | Admitting: Radiation Therapy

## 2020-03-02 ENCOUNTER — Other Ambulatory Visit: Payer: Self-pay | Admitting: Radiation Therapy

## 2020-03-02 ENCOUNTER — Ambulatory Visit
Admit: 2020-03-02 | Discharge: 2020-03-02 | Disposition: A | Discharge status: Home / Self Care | Payer: BC Managed Care – PPO | Attending: Internal Medicine | Admitting: Internal Medicine

## 2020-03-02 DIAGNOSIS — M7501 Adhesive capsulitis of right shoulder: Secondary | ICD-10-CM

## 2020-03-02 DIAGNOSIS — D496 Neoplasm of unspecified behavior of brain: Secondary | ICD-10-CM

## 2020-03-02 DIAGNOSIS — M7581 Other shoulder lesions, right shoulder: Secondary | ICD-10-CM | POA: Diagnosis not present

## 2020-03-02 LAB — OLIGOCLONAL BANDS, CSF + SERM

## 2020-03-02 NOTE — Telephone Encounter (Signed)
Left a detailed message on the patient's cell phone informing her of the 2 month brain MRI and visit to see Dr. Mickeal Skinner in August. I included my contact information for her to call back with any questions or concerns.   Mont Dutton R.T.(R)(T) Radiation Special Procedures Navigator

## 2020-03-03 DIAGNOSIS — G9389 Other specified disorders of brain: Secondary | ICD-10-CM | POA: Diagnosis not present

## 2020-03-07 DIAGNOSIS — G939 Disorder of brain, unspecified: Secondary | ICD-10-CM | POA: Diagnosis not present

## 2020-03-07 DIAGNOSIS — Z6841 Body Mass Index (BMI) 40.0 and over, adult: Secondary | ICD-10-CM | POA: Diagnosis not present

## 2020-03-10 DIAGNOSIS — Z79899 Other long term (current) drug therapy: Secondary | ICD-10-CM | POA: Diagnosis not present

## 2020-03-10 DIAGNOSIS — G939 Disorder of brain, unspecified: Secondary | ICD-10-CM | POA: Diagnosis not present

## 2020-03-11 ENCOUNTER — Other Ambulatory Visit: Payer: Self-pay | Admitting: Internal Medicine

## 2020-03-11 DIAGNOSIS — L299 Pruritus, unspecified: Secondary | ICD-10-CM

## 2020-03-11 DIAGNOSIS — G939 Disorder of brain, unspecified: Secondary | ICD-10-CM | POA: Diagnosis not present

## 2020-03-11 MED ORDER — HYDROXYZINE HCL 25 MG PO TABS: 25.0000 mg | ORAL_TABLET | Freq: Three times a day (TID) | ORAL | 0 refills | Status: AC | PRN

## 2020-03-11 NOTE — Progress Notes (Signed)
   Reason for call:   I received a call from Ms. Catha Brow Harnish at 5:30 PM indicating extreme itching after angiogram receiving iodonated dye.   Pertinent Data:   Mrs. Effertz had an angiogram yesterday at [facility] to check on a brain tumor then today had an MRI with contrast. She has a history of allergy to any type of iodinated dye. She was given prednisone and benadryl yesterday prior to her procedure and more benadryl today prior to her MRI. After the procedure her itching continued to get worse and is all over her body. She has been taking benadryl 50 mg every 4-6 hours without relief of her itching.   She denies SOB, swelling of her tongue, she cannot tell if she has a rash, she denies nausea, sweating or dizziness.     Assessment / Plan / Recommendations:   Hydroxyzine 25 mg q8h prn itching - discussed that this medication was more sedating than benadryl and not to take it more often than this. Her girlfriend will be staying with her to watch her.   Advised that if she develops systemic symptoms, she needs to proceed to the ER.    As always, pt is advised that if symptoms worsen or new symptoms arise, they should go to an urgent care facility or to to ER for further evaluation.   Zymeir Salminen A, DO   03/11/2020, 5:54 PM

## 2020-03-15 ENCOUNTER — Ambulatory Visit: Payer: BC Managed Care – PPO

## 2020-03-16 ENCOUNTER — Other Ambulatory Visit: Payer: Self-pay

## 2020-03-16 DIAGNOSIS — I10 Essential (primary) hypertension: Secondary | ICD-10-CM

## 2020-03-16 MED ORDER — LOSARTAN POTASSIUM 50 MG PO TABS: 50.0000 mg | ORAL_TABLET | Freq: Every day | ORAL | 3 refills | Status: DC

## 2020-03-16 MED ORDER — METOPROLOL TARTRATE 25 MG PO TABS: 25.0000 mg | ORAL_TABLET | Freq: Two times a day (BID) | ORAL | 3 refills | Status: DC

## 2020-03-16 NOTE — Telephone Encounter (Signed)
Patient is requesting refills on metoprolol and losartan per Sander Nephew, RN. Will forward request to PCP. SChaplin, RN,BSN

## 2020-03-17 DIAGNOSIS — F17210 Nicotine dependence, cigarettes, uncomplicated: Secondary | ICD-10-CM | POA: Diagnosis not present

## 2020-03-17 DIAGNOSIS — Z20822 Contact with and (suspected) exposure to covid-19: Secondary | ICD-10-CM | POA: Diagnosis not present

## 2020-03-17 DIAGNOSIS — C717 Malignant neoplasm of brain stem: Secondary | ICD-10-CM | POA: Diagnosis not present

## 2020-03-17 DIAGNOSIS — M5416 Radiculopathy, lumbar region: Secondary | ICD-10-CM | POA: Diagnosis not present

## 2020-03-17 DIAGNOSIS — R400 Somnolence: Secondary | ICD-10-CM | POA: Diagnosis not present

## 2020-03-17 DIAGNOSIS — I10 Essential (primary) hypertension: Secondary | ICD-10-CM | POA: Diagnosis not present

## 2020-03-17 DIAGNOSIS — G3184 Mild cognitive impairment, so stated: Secondary | ICD-10-CM | POA: Diagnosis not present

## 2020-03-17 DIAGNOSIS — Z6841 Body Mass Index (BMI) 40.0 and over, adult: Secondary | ICD-10-CM | POA: Diagnosis not present

## 2020-03-17 DIAGNOSIS — C719 Malignant neoplasm of brain, unspecified: Secondary | ICD-10-CM | POA: Diagnosis not present

## 2020-03-17 DIAGNOSIS — G4733 Obstructive sleep apnea (adult) (pediatric): Secondary | ICD-10-CM | POA: Diagnosis not present

## 2020-03-17 DIAGNOSIS — D331 Benign neoplasm of brain, infratentorial: Secondary | ICD-10-CM | POA: Diagnosis not present

## 2020-03-17 DIAGNOSIS — Z801 Family history of malignant neoplasm of trachea, bronchus and lung: Secondary | ICD-10-CM | POA: Diagnosis not present

## 2020-03-17 DIAGNOSIS — G939 Disorder of brain, unspecified: Secondary | ICD-10-CM | POA: Diagnosis not present

## 2020-03-17 DIAGNOSIS — R531 Weakness: Secondary | ICD-10-CM | POA: Diagnosis not present

## 2020-03-17 DIAGNOSIS — E669 Obesity, unspecified: Secondary | ICD-10-CM | POA: Diagnosis not present

## 2020-03-17 DIAGNOSIS — E785 Hyperlipidemia, unspecified: Secondary | ICD-10-CM | POA: Diagnosis not present

## 2020-03-17 DIAGNOSIS — G9389 Other specified disorders of brain: Secondary | ICD-10-CM | POA: Diagnosis not present

## 2020-03-17 DIAGNOSIS — G8194 Hemiplegia, unspecified affecting left nondominant side: Secondary | ICD-10-CM | POA: Diagnosis not present

## 2020-03-17 DIAGNOSIS — G919 Hydrocephalus, unspecified: Secondary | ICD-10-CM | POA: Diagnosis not present

## 2020-03-17 DIAGNOSIS — R0609 Other forms of dyspnea: Secondary | ICD-10-CM | POA: Diagnosis not present

## 2020-03-18 ENCOUNTER — Other Ambulatory Visit: Payer: Self-pay | Admitting: Internal Medicine

## 2020-03-18 DIAGNOSIS — I10 Essential (primary) hypertension: Secondary | ICD-10-CM

## 2020-03-28 ENCOUNTER — Other Ambulatory Visit: Payer: Self-pay | Admitting: Internal Medicine

## 2020-03-28 DIAGNOSIS — I10 Essential (primary) hypertension: Secondary | ICD-10-CM

## 2020-04-01 ENCOUNTER — Inpatient Hospital Stay: Payer: BC Managed Care – PPO | Admitting: Internal Medicine

## 2020-04-01 ENCOUNTER — Telehealth: Payer: Self-pay | Admitting: Internal Medicine

## 2020-04-01 ENCOUNTER — Telehealth: Payer: Self-pay | Admitting: Radiation Therapy

## 2020-04-01 NOTE — Telephone Encounter (Signed)
We received a referral from [place], Dr. Crisoforo Oxford, to have [name] consulted and set up for radiation treatment for her newly diagnosed primary brain tumor. She was scheduled to see our neuro-oncologist, Dr. Mickeal Skinner on 7/16. This appointment was cancelled by the patient's wife because they were instructed to not set anything up for future treatment until after the  follow-up with their [facility] surgeon on 9/13.   I have called the referring MD office to let them know we will reach back out to the patient after 9/13 and are happy to see her at that time to discuss recommendations and treatment.   Mont Dutton R.T.(R)(T) Radiation Special Procedures Navigator

## 2020-04-01 NOTE — Telephone Encounter (Signed)
Pt has declined to reschedule at this time. She will call back.

## 2020-04-08 DIAGNOSIS — G939 Disorder of brain, unspecified: Secondary | ICD-10-CM | POA: Diagnosis not present

## 2020-04-12 ENCOUNTER — Encounter: Payer: BC Managed Care – PPO | Admitting: Student

## 2020-04-29 ENCOUNTER — Other Ambulatory Visit (HOSPITAL_COMMUNITY): Payer: BC Managed Care – PPO

## 2020-05-02 ENCOUNTER — Ambulatory Visit: Payer: BC Managed Care – PPO | Admitting: Internal Medicine

## 2020-05-30 DIAGNOSIS — G939 Disorder of brain, unspecified: Secondary | ICD-10-CM | POA: Diagnosis not present

## 2020-06-02 ENCOUNTER — Ambulatory Visit (HOSPITAL_COMMUNITY): Admission: RE | Admit: 2020-06-02 | Payer: BC Managed Care – PPO | Source: Ambulatory Visit

## 2020-06-06 ENCOUNTER — Inpatient Hospital Stay: Payer: BC Managed Care – PPO | Admitting: Internal Medicine

## 2020-06-08 ENCOUNTER — Other Ambulatory Visit: Payer: Self-pay

## 2020-06-08 ENCOUNTER — Telehealth: Payer: Self-pay | Admitting: Radiation Therapy

## 2020-06-08 ENCOUNTER — Ambulatory Visit: Payer: BC Managed Care – PPO | Attending: Neurosurgery | Admitting: Physical Therapy

## 2020-06-08 ENCOUNTER — Encounter: Payer: Self-pay | Admitting: Physical Therapy

## 2020-06-08 DIAGNOSIS — R262 Difficulty in walking, not elsewhere classified: Secondary | ICD-10-CM | POA: Insufficient documentation

## 2020-06-08 DIAGNOSIS — M6281 Muscle weakness (generalized): Secondary | ICD-10-CM | POA: Diagnosis not present

## 2020-06-08 NOTE — Patient Instructions (Signed)
Access Code: [email or web address] URL: [email or web address] Date: 06/08/2020 Prepared by: Amador Cunas  Exercises Sit to Stand - 1 x daily - 7 x weekly - 3 sets - 10 reps Standing Hip Abduction with Counter Support - 1 x daily - 7 x weekly - 3 sets - 10 reps Standing Hip Extension with Counter Support - 1 x daily - 7 x weekly - 3 sets - 10 reps Heel rises with counter support - 1 x daily - 7 x weekly - 3 sets - 10 reps Standing Knee Flexion with Counter Support - 1 x daily - 7 x weekly - 3 sets - 10 reps

## 2020-06-08 NOTE — Therapy (Signed)
[address] [place], [place], [address] Phone: [phone]   Fax:  [phone]  Physical Therapy Evaluation  Patient Details  Name: Kathleen Stone MRN: [id number] Date of Birth: [birth year] Referring Provider (PT): Wende Mott   Encounter Date: 06/08/2020   PT End of Session - 06/08/20 1718    Visit Number 1    Date for PT Re-Evaluation 08/08/20    PT Start Time 1630    PT Stop Time 1710    PT Time Calculation (min) 40 min    Activity Tolerance Patient tolerated treatment well    Behavior During Therapy [facility] for tasks assessed/performed           Past Medical History:  Diagnosis Date  . Chronic lower back pain    Negative MRI of hips bilaterally, neg CT scan of A and P; MRI of T and L spine-> very mild deg changes from T4-5 and T7-8, cervical spondylotic changes at C5-6, very mild disc bulge at L2-3 without spinal stenosis, nl ESR, ANA, RF, HIV, TSH, and B12  . DJD (degenerative joint disease) of cervical spine   . Family history of adverse reaction to anesthesia    "sisters PONV"  . Galactorrhea of both breasts 2009   R. sided w/ benign papilloma excised in 4/09. L sided in 5/09  . Gallstones   . Hypertension   . Migraine    "monthly" (11/15/2016)  . Neuropathy    Thought to be 2/2 nerve compression in the C-spine  . Obesity   . Personal history of adenomatous colonic polyp    09/2007 - diminutive adenoma [name])  . PONV (postoperative nausea and vomiting)   . Tobacco abuse     Past Surgical History:  Procedure Laterality Date  . APPENDECTOMY    . BREAST SURGERY Right 12/2007   central duct excision  . CHOLECYSTECTOMY N/A 12/08/2019   Procedure: LAPAROSCOPIC CHOLECYSTECTOMY;  Surgeon: Ralene Ok, MD;  Location: [facility];  Service: General;  Laterality: N/A;  . COLONOSCOPY W/ BIOPSIES AND POLYPECTOMY  09/2007   Dr. [name]. 2 small adenomatous polyps, internal hemorrhoids-Grade 1. Rec  routine  colonoscopy at age 40.  Marland Kitchen COLONOSCOPY W/ BIOPSIES AND POLYPECTOMY    . NASAL SEPTOPLASTY W/ TURBINOPLASTY Bilateral 04/2008   Septoplasty with bilateral inferior turbinate reduction; Dr. Lucia Gaskins  . SHOULDER OPEN ROTATOR CUFF REPAIR Right     after hurting it by moving heavy objecs    There were no vitals filed for this visit.    Subjective Assessment - 06/08/20 1638    Subjective Pt reports that she was diagnosed with a benign brain tumor on 03/17/2020. Pt was in the hospital for 2 weeks for brain biopsy. Pt reports that she has been having N/T in L hand, some weakness in L leg, and significant endurance problems. Pt works at [employer]; is on light duty right now. Pt reports that she is having trouble getting up/down out of the chair, getting in/out of bed, and going up stairs. Pt states that she is not undergoing radiation or surgery right now for brain tumor; is supposed to follow up with another MRI in Jan 2022.    Pertinent History HTN, knee pain, LBP    Limitations Lifting;Standing;Walking;House hold activities    Diagnostic tests MRI    Patient Stated Goals "get back to normal"    Currently in Pain? Yes    Pain Score 8     Pain Location Knee  Pain Orientation Right    Pain Descriptors / Indicators Aching    Pain Type Acute pain    Pain Onset In the past 7 days    Pain Frequency Intermittent    Aggravating Factors  prolonged standing    Pain Relieving Factors rest              [facility] PT Assessment - 06/08/20 0001      Assessment   Medical Diagnosis LE weakness, brain stem lesion    Referring Provider (PT) [name]    Onset Date/Surgical Date 03/17/20    Next MD Visit 06/16/2020    Prior Therapy PT      Precautions   Precautions None      Restrictions   Weight Bearing Restrictions No      Balance Screen   Has the patient fallen in the past 6 months Yes    How many times? 1   fell out of the bed while sleeping   Has the patient had a decrease in activity level  because of a fear of falling?  No    Is the patient reluctant to leave their home because of a fear of falling?  No      Home Environment   Additional Comments stairs to enter; no trouble with stairs      Prior Function   Level of Independence Independent    Vocation Full time employment    Vocation Requirements lifting up to 30#, carrying, walking, driving    Leisure sedentary      Cognition   Overall Cognitive Status Impaired/Different from baseline    Area of Impairment Following commands;Memory    Memory Comments forgetting task at hand    Following Command Comments difficulty with multistep commands    Memory Impaired   pt reports memory difficulties at home     Observation/Other Assessments   Cranial Nerve(s) CN intact      Sensation   Light Touch Appears Intact    Additional Comments pt reports feeling cold on L side of body      Coordination   Gross Motor Movements are Fluid and Coordinated --    Coordination and Movement Description Dysdiadochokinesia on L    Finger Nose Finger Test impaired on L    Heel Shin Test impaired on L      Functional Tests   Functional tests Sit to Stand      Sit to Stand   Comments fatigues quickly, leaning to RLE      Posture/Postural Control   Posture/Postural Control Postural limitations    Postural Limitations Rounded Shoulders;Forward head      Tone   Assessment Location --   none noted     ROM / Strength   AROM / PROM / Strength Strength      Strength   Overall Strength Comments LLE 4/5, RLE 5/5      Transfers   Five time sit to stand comments  >20 sec, fatigues quickly    Comments difficulty with supine to sit EOB; increased time to complete task                      Objective measurements completed on examination: See above findings.       [facility] Adult PT Treatment/Exercise - 06/08/20 0001      Exercises   Exercises Knee/Hip      Knee/Hip Exercises: Standing   Heel Raises Both;1 set;10 reps     Knee  Flexion Both;1 set;10 reps    Hip Abduction Both;1 set;10 reps    Hip Extension Both;1 set;10 reps    Other Standing Knee Exercises STS with focus on eccentric control x10                  PT Education - 06/08/20 1716    Education Details Pt educated on POC and HEP    Person(s) Educated Patient    Methods Explanation;Demonstration;Handout    Comprehension Verbalized understanding;Returned demonstration            PT Short Term Goals - 06/08/20 1733      PT SHORT TERM GOAL #1   Title Independent with inital HEP    Time 2    Period Weeks    Status New    Target Date 06/22/20             PT Long Term Goals - 06/08/20 1733      PT LONG TERM GOAL #1   Title She will be independent with all HEP issued as of last visit    Time 8    Period Weeks    Status New    Target Date 08/03/20      PT LONG TERM GOAL #2   Title Pt will report ability to return to full work duties    Time 8    Period Weeks    Status New    Target Date 08/03/20      PT LONG TERM GOAL #3   Title Pt will demo ability to walk >500 ft over level/unlevel surfaces with no LOB or increased pain    Time 8    Period Weeks    Status New    Target Date 08/03/20                  Plan - 06/08/20 1720    Clinical Impression Statement Pt presents to clinic s/p brain biopsy for dorsal midbrain lesion on 03/17/2020. Pt underwent stereotactic right brain biopsy and placement of right frontal ventriculostomy (03/17/20) that was eventually removed. 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 Pt reports L sided weakness, facial numbness, memory deficits, fatigue, and endurance deficits since hospitalization. Plan per MD is for PT to address global strength/endurance deficits and a follow up MRI in Jan 2022. Currently, no plans for radiation to reduce size or surgical resection of tumor. In clinic pt demos L LE weakness, fatigues quickly with STS  and standing ex's, endurance deficits, and R knee pain (unrelated to dx of brain lesion). Pt demos functional deficits with transfers, difficulty getting in/out of bed, and difficulty with stairs. B horizontal nystagmus present with eye exam, CN reflexes intact. Pt demos cognitive/memory deficits on exam. Difficulty following multistep instructions and difficulty remembering what she was doing if exercise was interrupted. Discussed potential referral to speech therapy for cognitive deficits; follow up on this next rx.    Personal Factors and Comorbidities Comorbidity 2    Comorbidities HTN, hx of LBP    Examination-Activity Limitations Stairs;Stand;Locomotion Level;Transfers    Examination-Participation Restrictions Community Activity;Interpersonal Relationship    Stability/Clinical Decision Making Evolving/Moderate complexity    Clinical Decision Making Moderate    Rehab Potential Good    PT Frequency 2x / week    PT Duration 8 weeks    PT Treatment/Interventions ADLs/Self Care Home Management;Electrical Stimulation;Moist Heat;Neuromuscular re-education;Balance training;Therapeutic exercise;Therapeutic activities;Functional mobility training;Stair training;Gait training;Patient/family education;Manual techniques  PT Next Visit Plan follow up on speech referral, LE strength, endurance ex's, functional strengthening    PT Home Exercise Plan STS, hip abd/ext/knee flex/heel raises    Recommended Other Services consider speech referral    Consulted and Agree with Plan of Care Patient           Patient will benefit from skilled therapeutic intervention in order to improve the following deficits and impairments:  Abnormal gait, Difficulty walking, Decreased endurance, Decreased safety awareness, Obesity, Decreased activity tolerance, Pain, Improper body mechanics, Decreased mobility, Decreased strength, Decreased cognition  Visit Diagnosis: Muscle weakness (generalized)  Difficulty in walking,  not elsewhere classified     Problem List Patient Active Problem List   Diagnosis Date Noted  . Brain neoplasm ([facility]) 02/26/2020  . Headache 02/24/2020  . Brain lesion 02/24/2020  . Hematuria, microscopic 02/24/2020  . Adhesive capsulitis of right shoulder 01/27/2020  . Cholelithiasis 10/30/2019  . Hyperlipidemia 05/21/2019  . Abnormal stress test 01/16/2017  . Tingling of left upper extremity 11/15/2016  . Essential hypertension 06/13/2016  . Right knee pain 02/16/2016  . Lumbar radiculopathy, chronic 01/04/2016  . Scleral discoloration 11/01/2015  . Personal history of adenomatous colonic polyp 08/17/2012  . Syncope 07/24/2012  . Preventative health care 07/24/2012  . Depression 08/23/2011  . DEGENERATIVE DISC DISEASE, 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  . NEUROPATHY 07/28/2010  . Disturbance in sleep behavior 07/28/2010  . LOW BACK PAIN SYNDROME 07/10/2007  . OBESITY NOS 01/15/2007  . TOBACCO ABUSE 10/07/2006  . MIGRAINE HEADACHE 10/04/2006   Amador Cunas, PT, DPT Donald Prose Aveya Beal 06/08/2020, 5:44 PM  [address] [place], [place], [address] Phone: [phone]   Fax:  [phone]  Name: Kathleen Stone MRN: [id number] Date of Birth: 12-Nov-1965

## 2020-06-08 NOTE — Telephone Encounter (Signed)
I spoke with the referring office about [name]. The surgeon has released her to be seen by Dr. Mickeal Skinner, and the Rad Onc there at [facility], Dr. Crisoforo Oxford, has also referred her to Dr. Mickeal Skinner for consideration of treatment closer to home. There was mention that this is a slow growing process, so there is no rush for radiation treatment at this time. Based on that, the patient's wife would like to go ahead and have Kathleen Stone see Dr. Mickeal Skinner, but push the MRI out until later when treatment is considered. (the surgeon mentioned another scan in 6-8 weeks) I told her that we will also need to decide who will be doing the follow-up scans so that there are not multiple providers trying to order procedures for her. They plan to talk to Dr. Mickeal Skinner about this during her visit.  The consult has been rescheduled to next week and I mentioned that we do not have all of the MD notes from her surgeon and the [place] at [facility]. She understands that she may need to come by to sign a release of information form for this. I have routed this information to Shelle Iron RN, Dr. Renda Rolls nurse, to reach back out to the patient's wife if the patient needs to come in to sign the medical release form.   Mont Dutton R.T.(R)(T) Radiation Special Procedures Navigator

## 2020-06-13 ENCOUNTER — Ambulatory Visit (INDEPENDENT_AMBULATORY_CARE_PROVIDER_SITE_OTHER): Payer: BC Managed Care – PPO | Admitting: Internal Medicine

## 2020-06-13 ENCOUNTER — Other Ambulatory Visit: Payer: Self-pay

## 2020-06-13 VITALS — BP 156/68 | HR 72 | Temp 98.3°F | Ht 66.0 in | Wt 281.3 lb

## 2020-06-13 DIAGNOSIS — R7309 Other abnormal glucose: Secondary | ICD-10-CM | POA: Diagnosis not present

## 2020-06-13 DIAGNOSIS — Z1211 Encounter for screening for malignant neoplasm of colon: Secondary | ICD-10-CM

## 2020-06-13 DIAGNOSIS — Z1231 Encounter for screening mammogram for malignant neoplasm of breast: Secondary | ICD-10-CM

## 2020-06-13 DIAGNOSIS — R202 Paresthesia of skin: Secondary | ICD-10-CM | POA: Diagnosis not present

## 2020-06-13 DIAGNOSIS — G629 Polyneuropathy, unspecified: Secondary | ICD-10-CM

## 2020-06-13 DIAGNOSIS — C719 Malignant neoplasm of brain, unspecified: Secondary | ICD-10-CM

## 2020-06-13 DIAGNOSIS — H02401 Unspecified ptosis of right eyelid: Secondary | ICD-10-CM

## 2020-06-13 DIAGNOSIS — Z1159 Encounter for screening for other viral diseases: Secondary | ICD-10-CM | POA: Diagnosis not present

## 2020-06-13 DIAGNOSIS — Z0000 Encounter for general adult medical examination without abnormal findings: Secondary | ICD-10-CM

## 2020-06-13 DIAGNOSIS — Z13220 Encounter for screening for lipoid disorders: Secondary | ICD-10-CM | POA: Diagnosis not present

## 2020-06-13 DIAGNOSIS — I10 Essential (primary) hypertension: Secondary | ICD-10-CM

## 2020-06-13 MED ORDER — LOSARTAN POTASSIUM 50 MG PO TABS: 50.0000 mg | ORAL_TABLET | Freq: Two times a day (BID) | ORAL | 5 refills | Status: DC

## 2020-06-13 NOTE — Patient Instructions (Addendum)
Thank you for allowing [place] to provide your care today. Today we discussed brain tumor and left sided tingling.   Today we made the following changes to your medications:   Please START taking  Losartan 50 mg - take one tablet in the morning and in the evening.   Please follow-up in two weeks for blood pressure check.    I will talk with Dr. Wende Mott about your new left sided numbness.   Please call the internal medicine center clinic if you have any questions or concerns, we may be able to help and keep you from a long and expensive emergency room wait. Our clinic and after hours phone number is [phone], the best time to call is Monday through Friday 9 am to 4 pm but there is always someone available 24/7 if you have an emergency. If you need medication refills please notify your pharmacy one week in advance and they will send [place] a request.

## 2020-06-13 NOTE — Progress Notes (Signed)
   CC: brain tumor  HPI:  [name] is a 54 y.o. with PMH as below.   Please see A&P for assessment of the patient's acute and chronic medical conditions.   Past Medical History:  Diagnosis Date  . Chronic lower back pain    Negative MRI of hips bilaterally, neg CT scan of A and P; MRI of T and L spine-> very mild deg changes from T4-5 and T7-8, cervical spondylotic changes at C5-6, very mild disc bulge at L2-3 without spinal stenosis, nl ESR, ANA, RF, HIV, TSH, and B12  . DJD (degenerative joint disease) of cervical spine   . Family history of adverse reaction to anesthesia    "sisters PONV"  . Galactorrhea of both breasts 2009   R. sided w/ benign papilloma excised in 4/09. L sided in 5/09  . Gallstones   . Hypertension   . Migraine    "monthly" (11/15/2016)  . Neuropathy    Thought to be 2/2 nerve compression in the C-spine  . Obesity   . Personal history of adenomatous colonic polyp    09/2007 - diminutive adenoma Kathleen Stone)  . PONV (postoperative nausea and vomiting)   . Tobacco abuse    Review of Systems:   Review of Systems  Constitutional: Negative for chills and fever.  Eyes: Positive for blurred vision and double vision. Negative for pain.  Gastrointestinal: Negative for abdominal pain, constipation, diarrhea and nausea.  Genitourinary: Negative for dysuria, frequency and urgency.  Musculoskeletal: Negative for falls and myalgias.  Neurological: Positive for sensory change and headaches. Negative for dizziness, speech change and loss of consciousness.   Physical Exam:  Constitution: NAD, appears stated age Eyes: eom intact, perrla Cardio: RRR, no m/r/g, no LE edema  Respiratory: CTA, no w/r/r MSK: strength 4/5 LUE, 5/5 RUE, decreased sensation RUE Neuro: normal affect, a&ox3, decreased sensation left side of face, droop of right eyelid, CN II-XII otherwise intact  Skin: c/d/i    Vitals:   06/13/20 1435 06/13/20 1504  BP: (!) 176/72 (!) 156/68    Pulse: 76 72  Temp: 98.3 F (36.8 C)   TempSrc: Oral   SpO2: 100%   Weight: 281 lb 4.8 oz (127.6 kg)   Height: $Remove'5\' 6"'tMwxGBG$  (1.676 m)     Assessment & Plan:   See Encounters Tab for problem based charting.  Patient discussed with Dr. Jimmye Norman

## 2020-06-14 ENCOUNTER — Ambulatory Visit: Payer: BC Managed Care – PPO

## 2020-06-14 DIAGNOSIS — M6281 Muscle weakness (generalized): Secondary | ICD-10-CM

## 2020-06-14 DIAGNOSIS — R262 Difficulty in walking, not elsewhere classified: Secondary | ICD-10-CM | POA: Diagnosis not present

## 2020-06-14 LAB — LIPID PANEL
Chol/HDL Ratio: 6.1 ratio — ABNORMAL HIGH (ref 0.0–4.4)
Cholesterol, Total: 209 mg/dL — ABNORMAL HIGH (ref 100–199)
HDL: 34 mg/dL — ABNORMAL LOW (ref >39)
LDL Chol Calc (NIH): 124 mg/dL — ABNORMAL HIGH (ref 0–99)
Triglycerides: 287 mg/dL — ABNORMAL HIGH (ref 0–149)
VLDL Cholesterol Cal: 51 mg/dL — ABNORMAL HIGH (ref 5–40)

## 2020-06-14 LAB — HEMOGLOBIN A1C
Est. average glucose Bld gHb Est-mCnc: 111 mg/dL
Hgb A1c MFr Bld: 5.5 % (ref 4.8–5.6)

## 2020-06-14 LAB — HEPATITIS C ANTIBODY: Hep C Virus Ab: <0.1 s/co ratio (ref 0.0–0.9)

## 2020-06-14 NOTE — Assessment & Plan Note (Signed)
-   pap smear - f/u two weeks for pap  - colonoscopy - last 2009. Referral sent for colonoscopy  - mammogram - referral for mammogram placed

## 2020-06-14 NOTE — Assessment & Plan Note (Signed)
Since biopsy she endorses symptoms of left sided numbness affecting the face and upper extremity. She also has had some changes in vision and now wears bifocals. Her sister also states she has drooping of the right eye since then as well.  Per chart review she does have a history of lower extremity neuropathy with unclear etiology but no mention of UE. There is also a history of cervical stenosis but no notes and only an xr from 2010 which showed mild spondylosis at c5-c6, this would not really explain her facial numbness, however.   Strength on the left is 4/5, appears to have droop of right eyelid, CN otherwise intact.  Reviewing recent neurosurgery notes her symptoms are thought to unlikely be related to her tumor, and she will f/u with neurosurgery in January of 2022.   - will discuss case with Dr. Wende Mott - reached out to his office, he will be back in the office 10/4, will try to discuss with him then  - cannot rule out stroke since d/c, will repeat MRI  - requested ophthalmology records

## 2020-06-14 NOTE — Assessment & Plan Note (Signed)
BP Readings from Last 3 Encounters:  06/13/20 (!) 156/68  02/29/20 (!) 123/53  02/24/20 135/66   Blood pressure elevated today. Recheck 156/76. She has been taking her losartan 50 mg qd and lopressor 25 mg. She states she has been more adherent with this lately than in the past.   - increase losartan 50 mg qd to bid  - continue metoprolol xr 25 mg qd

## 2020-06-14 NOTE — Therapy (Signed)
[address] [place], [place], [address] Phone: [phone]   Fax:  [phone]  Physical Therapy Treatment  Patient Details  Name: Kathleen Stone MRN: [id number] Date of Birth: April 24, 1966 Referring Provider (PT): Wende Mott   Encounter Date: 06/14/2020   PT End of Session - 06/14/20 1623    Visit Number 2    Date for PT Re-Evaluation 08/08/20    PT Start Time 6803    PT Stop Time 1700    PT Time Calculation (min) 43 min    Activity Tolerance Patient tolerated treatment well    Behavior During Therapy [facility] for tasks assessed/performed           Past Medical History:  Diagnosis Date  . Chronic lower back pain    Negative MRI of hips bilaterally, neg CT scan of A and P; MRI of T and L spine-> very mild deg changes from T4-5 and T7-8, cervical spondylotic changes at C5-6, very mild disc bulge at L2-3 without spinal stenosis, nl ESR, ANA, RF, HIV, TSH, and B12  . DJD (degenerative joint disease) of cervical spine   . Family history of adverse reaction to anesthesia    "sisters PONV"  . Galactorrhea of both breasts 2009   R. sided w/ benign papilloma excised in 4/09. L sided in 5/09  . Gallstones   . Hypertension   . Migraine    "monthly" (11/15/2016)  . Neuropathy    Thought to be 2/2 nerve compression in the C-spine  . Obesity   . Personal history of adenomatous colonic polyp    09/2007 - diminutive adenoma [name])  . PONV (postoperative nausea and vomiting)   . Tobacco abuse     Past Surgical History:  Procedure Laterality Date  . APPENDECTOMY    . BREAST SURGERY Right 12/2007   central duct excision  . CHOLECYSTECTOMY N/A 12/08/2019   Procedure: LAPAROSCOPIC CHOLECYSTECTOMY;  Surgeon: Ralene Ok, MD;  Location: [facility];  Service: General;  Laterality: N/A;  . COLONOSCOPY W/ BIOPSIES AND POLYPECTOMY  09/2007   Dr. [name]. 2 small adenomatous polyps, internal hemorrhoids-Grade 1. Rec  routine  colonoscopy at age 41.  Marland Kitchen COLONOSCOPY W/ BIOPSIES AND POLYPECTOMY    . NASAL SEPTOPLASTY W/ TURBINOPLASTY Bilateral 04/2008   Septoplasty with bilateral inferior turbinate reduction; Dr. Lucia Gaskins  . SHOULDER OPEN ROTATOR CUFF REPAIR Right     after hurting it by moving heavy objecs    There were no vitals filed for this visit.   Subjective Assessment - 06/14/20 1621    Subjective Pt reports she has been doing her HEP 2x/day before bed and n the morning. She has been walking as well, up to 5 minutes.    Pertinent History HTN, knee pain, LBP    Limitations Lifting;Standing;Walking;House hold activities    Diagnostic tests MRI    Patient Stated Goals "get back to normal"    Currently in Pain? No/denies    Pain Onset In the past 7 days                             [facility] Adult PT Treatment/Exercise - 06/14/20 0001      Knee/Hip Exercises: Aerobic   Nustep L4 6' UE/LE    Other Aerobic UBE L 2 4' (2 fwd/2 bkwd)      Knee/Hip Exercises: Standing   Heel Raises Both;1 set;10 reps    Knee Flexion Both;1 set;10  reps    Hip Abduction Both;1 set;10 reps    Hip Extension Both;1 set;10 reps      Knee/Hip Exercises: Supine   Bridges Both;1 set;10 reps    Bridges Limitations PPT x 10    Other Supine Knee/Hip Exercises supine to L roll with L knee bent, reaching across with R UE x10   pulling with R UE from EOM as needed                   PT Short Term Goals - 06/08/20 1733      PT SHORT TERM GOAL #1   Title Independent with inital HEP    Time 2    Period Weeks    Status New    Target Date 06/22/20             PT Long Term Goals - 06/08/20 1733      PT LONG TERM GOAL #1   Title She will be independent with all HEP issued as of last visit    Time 8    Period Weeks    Status New    Target Date 08/03/20      PT LONG TERM GOAL #2   Title Pt will report ability to return to full work duties    Time 8    Period Weeks    Status New    Target Date  08/03/20      PT LONG TERM GOAL #3   Title Pt will demo ability to walk >500 ft over level/unlevel surfaces with no LOB or increased pain    Time 8    Period Weeks    Status New    Target Date 08/03/20                 Plan - 06/14/20 1623    Clinical Impression Statement Pt tolerated tx well with LE fatigue, but minimal need for rest breaks. Pt demo sig anterior pelvic tilt with attempt to perform standing PPT at wall, but unable so initiated in supine on mat table progressing to bridge. Pt asked to work on rolling since wife has to help her roll L in bed. Pt able to perform on mat table with min pulling from R UE on EOM.    Personal Factors and Comorbidities Comorbidity 2    Comorbidities HTN, hx of LBP    Examination-Activity Limitations Stairs;Stand;Locomotion Level;Transfers    Examination-Participation Restrictions Community Activity;Interpersonal Relationship    Stability/Clinical Decision Making Evolving/Moderate complexity    Rehab Potential Good    PT Frequency 2x / week    PT Duration 8 weeks    PT Treatment/Interventions ADLs/Self Care Home Management;Electrical Stimulation;Moist Heat;Neuromuscular re-education;Balance training;Therapeutic exercise;Therapeutic activities;Functional mobility training;Stair training;Gait training;Patient/family education;Manual techniques    PT Next Visit Plan follow up on speech referral, LE strength, endurance ex's, functional strengthening    PT Home Exercise Plan STS, hip abd/ext/knee flex/heel raises    Consulted and Agree with Plan of Care Patient           Patient will benefit from skilled therapeutic intervention in order to improve the following deficits and impairments:  Abnormal gait, Difficulty walking, Decreased endurance, Decreased safety awareness, Obesity, Decreased activity tolerance, Pain, Improper body mechanics, Decreased mobility, Decreased strength, Decreased cognition  Visit Diagnosis: Muscle weakness  (generalized)  Difficulty in walking, not elsewhere classified     Problem List Patient Active Problem List   Diagnosis Date Noted  . Pilocytic astrocytoma ([facility]) 02/26/2020  .  Headache 02/24/2020  . Hematuria, microscopic 02/24/2020  . Adhesive capsulitis of right shoulder 01/27/2020  . Cholelithiasis 10/30/2019  . Hyperlipidemia 05/21/2019  . Abnormal stress test 01/16/2017  . Tingling of left upper extremity 11/15/2016  . Essential hypertension 06/13/2016  . Right knee pain 02/16/2016  . Lumbar radiculopathy, chronic 01/04/2016  . Personal history of adenomatous colonic polyp 08/17/2012  . Syncope 07/24/2012  . Preventative health care 07/24/2012  . Depression 08/23/2011  . DEGENERATIVE DISC DISEASE, 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  . NEUROPATHY 07/28/2010  . Disturbance in sleep behavior 07/28/2010  . LOW BACK PAIN SYNDROME 07/10/2007  . TOBACCO ABUSE 10/07/2006    Izell Fyffe, PT, DPT 06/14/2020, 4:59 PM  [address]. [place], [place], [address] Phone: [phone]   Fax:  [phone]  Name: SHAKIMA NISLEY MRN: [id number] Date of Birth: 07-28-1966

## 2020-06-16 ENCOUNTER — Ambulatory Visit: Payer: BC Managed Care – PPO | Admitting: Physical Therapy

## 2020-06-16 ENCOUNTER — Inpatient Hospital Stay: Payer: BC Managed Care – PPO | Attending: Internal Medicine | Admitting: Internal Medicine

## 2020-06-16 ENCOUNTER — Other Ambulatory Visit: Payer: Self-pay

## 2020-06-16 VITALS — BP 159/70 | HR 67 | Temp 98.2°F | Resp 18 | Ht 66.0 in | Wt 279.9 lb

## 2020-06-16 DIAGNOSIS — F17210 Nicotine dependence, cigarettes, uncomplicated: Secondary | ICD-10-CM | POA: Insufficient documentation

## 2020-06-16 DIAGNOSIS — R262 Difficulty in walking, not elsewhere classified: Secondary | ICD-10-CM | POA: Diagnosis not present

## 2020-06-16 DIAGNOSIS — Z801 Family history of malignant neoplasm of trachea, bronchus and lung: Secondary | ICD-10-CM | POA: Diagnosis not present

## 2020-06-16 DIAGNOSIS — Z23 Encounter for immunization: Secondary | ICD-10-CM | POA: Insufficient documentation

## 2020-06-16 DIAGNOSIS — C719 Malignant neoplasm of brain, unspecified: Secondary | ICD-10-CM | POA: Insufficient documentation

## 2020-06-16 DIAGNOSIS — Z800 Family history of malignant neoplasm of digestive organs: Secondary | ICD-10-CM | POA: Insufficient documentation

## 2020-06-16 DIAGNOSIS — M6281 Muscle weakness (generalized): Secondary | ICD-10-CM

## 2020-06-16 MED ORDER — INFLUENZA VAC SPLIT QUAD 0.5 ML IM SUSY
PREFILLED_SYRINGE | INTRAMUSCULAR | Status: AC
  Filled 2020-06-16: qty 0.5

## 2020-06-16 MED ORDER — INFLUENZA VAC SPLIT QUAD 0.5 ML IM SUSY
0.5000 mL | PREFILLED_SYRINGE | Freq: Once | INTRAMUSCULAR | Status: AC
  Administered 2020-06-16: 0.5 mL via INTRAMUSCULAR

## 2020-06-16 NOTE — Therapy (Signed)
[place] [place], [place], [address] Phone: [phone]   Fax:  [phone]  Physical Therapy Treatment  Patient Details  Name: Kathleen Stone MRN: [id number] Date of Birth: [birth year] Referring Provider (PT): Wende Mott   Encounter Date: 06/16/2020   PT End of Session - 06/16/20 1638    Visit Number 3    Date for PT Re-Evaluation 08/08/20    PT Start Time 8413    PT Stop Time 2440    PT Time Calculation (min) 42 min           Past Medical History:  Diagnosis Date  . Chronic lower back pain    Negative MRI of hips bilaterally, neg CT scan of A and P; MRI of T and L spine-> very mild deg changes from T4-5 and T7-8, cervical spondylotic changes at C5-6, very mild disc bulge at L2-3 without spinal stenosis, nl ESR, ANA, RF, HIV, TSH, and B12  . DJD (degenerative joint disease) of cervical spine   . Family history of adverse reaction to anesthesia    "sisters PONV"  . Galactorrhea of both breasts 2009   R. sided w/ benign papilloma excised in 4/09. L sided in 5/09  . Gallstones   . Hypertension   . Migraine    "monthly" (11/15/2016)  . Neuropathy    Thought to be 2/2 nerve compression in the C-spine  . Obesity   . Personal history of adenomatous colonic polyp    09/2007 - diminutive adenoma [name])  . PONV (postoperative nausea and vomiting)   . Tobacco abuse     Past Surgical History:  Procedure Laterality Date  . APPENDECTOMY    . BREAST SURGERY Right 12/2007   central duct excision  . CHOLECYSTECTOMY N/A 12/08/2019   Procedure: LAPAROSCOPIC CHOLECYSTECTOMY;  Surgeon: Ralene Ok, MD;  Location: [facility];  Service: General;  Laterality: N/A;  . COLONOSCOPY W/ BIOPSIES AND POLYPECTOMY  09/2007   Dr. [name]. 2 small adenomatous polyps, internal hemorrhoids-Grade 1. Rec  routine colonoscopy at age 26.  Marland Kitchen COLONOSCOPY W/ BIOPSIES AND POLYPECTOMY    . NASAL SEPTOPLASTY W/ TURBINOPLASTY Bilateral 04/2008     Septoplasty with bilateral inferior turbinate reduction; Dr. Lucia Gaskins  . SHOULDER OPEN ROTATOR CUFF REPAIR Right     after hurting it by moving heavy objecs    There were no vitals filed for this visit.   Subjective Assessment - 06/16/20 1553    Subjective saw MD today and he confirmed I did have a stroke. doing ex just tired    Currently in Pain? No/denies                             [facility] Adult PT Treatment/Exercise - 06/16/20 0001      Knee/Hip Exercises: Aerobic   Nustep L4 6' UE/LE    Other Aerobic UBE L 3 4' (2 fwd/2 bkwd)      Knee/Hip Exercises: Machines for Strengthening   Cybex Knee Extension 10# 2 sets 10    Cybex Knee Flexion 20# 2 sets 10      Knee/Hip Exercises: Standing   Lateral Step Up Both;10 reps;Hand Hold: 2;Step Height: 4"    Forward Step Up Both;10 reps;Step Height: 2";Step Height: 4"    Walking with Sports Cord 30# fwd/back and each side 4 x each    Other Standing Knee Exercises 3# UE circuit 2 x through 8 reps  Knee/Hip Exercises: Seated   Sit to Sand 3 sets;5 reps;without UE support   with wt ball     Knee/Hip Exercises: Prone   Other Prone Exercises quadreped alt arms 10,altlegs 10 x alt arm/leg 10 x with cuing and CGA                  PT Education - 06/16/20 1636    Education Details issued red tband  for current HEP for hips and issued quadreped ex    Person(s) Educated Patient    Methods Explanation;Demonstration    Comprehension Verbalized understanding;Returned demonstration            PT Short Term Goals - 06/16/20 1638      PT SHORT TERM GOAL #1   Title Independent with inital HEP    Status Achieved      PT SHORT TERM GOAL #2   Title PAin reported improved 30% at work    Status Partially Met      PT [place] #3   Title reports sleeping longer due to use of pillow support for sleep posture    Status Partially Met             PT Long Term Goals - 06/08/20 1733      PT LONG TERM  GOAL #1   Title She will be independent with all HEP issued as of last visit    Time 8    Period Weeks    Status New    Target Date 08/03/20      PT LONG TERM GOAL #2   Title Pt will report ability to return to full work duties    Time 8    Period Weeks    Status New    Target Date 08/03/20      PT LONG TERM GOAL #3   Title Pt will demo ability to walk >500 ft over level/unlevel surfaces with no LOB or increased pain    Time 8    Period Weeks    Status New    Target Date 08/03/20                 Plan - 06/16/20 1639    Clinical Impression Statement pt is progressing towards goals. pt needed minimal rest breaks but was noteable fatigued and sweating. STS, step ups, UE circuit and quadreped were hardest for pt, left side noteably weaker. cuing and guarded needed with ex    PT Treatment/Interventions ADLs/Self Care Home Management;Electrical Stimulation;Moist Heat;Neuromuscular re-education;Balance training;Therapeutic exercise;Therapeutic activities;Functional mobility training;Stair training;Gait training;Patient/family education;Manual techniques    PT Next Visit Plan follow up on speech referral, LE strength, endurance ex's, functional strengthening           Patient will benefit from skilled therapeutic intervention in order to improve the following deficits and impairments:  Abnormal gait, Difficulty walking, Decreased endurance, Decreased safety awareness, Obesity, Decreased activity tolerance, Pain, Improper body mechanics, Decreased mobility, Decreased strength, Decreased cognition  Visit Diagnosis: Muscle weakness (generalized)  Difficulty in walking, not elsewhere classified     Problem List Patient Active Problem List   Diagnosis Date Noted  . 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 Headache 02/24/2020  . Hematuria, microscopic 02/24/2020  . Adhesive capsulitis of right shoulder 01/27/2020  . Cholelithiasis 10/30/2019  . Hyperlipidemia 05/21/2019   . Abnormal stress test 01/16/2017  . Tingling of left upper extremity 11/15/2016  . Essential hypertension 06/13/2016  . Right knee pain 02/16/2016  . Lumbar radiculopathy,  chronic 01/04/2016  . Personal history of adenomatous colonic polyp 08/17/2012  . Syncope 07/24/2012  . Preventative health care 07/24/2012  . Depression 08/23/2011  . DEGENERATIVE DISC DISEASE, 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  . NEUROPATHY 07/28/2010  . Disturbance in sleep behavior 07/28/2010  . LOW BACK PAIN SYNDROME 07/10/2007  . TOBACCO ABUSE 10/07/2006    Lemon Whitacre,ANGIE PTA 06/16/2020, 4:44 PM  [place]. [place], [place], [address] Phone: [phone]   Fax:  [phone]  Name: CHANDLAR STAEBELL MRN: [id number] Date of Birth: 08/06/1966

## 2020-06-17 NOTE — Progress Notes (Signed)
[facility] [place], [place] [address] [phone]   New Patient Evaluation  Date of Service: 06/17/20 Patient Name: Kathleen Stone Patient MRN: [id number] Patient DOB: [birth year] Provider: Ventura Sellers, MD  Identifying Statement:  Kathleen Stone is a 54 y.o. 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    Referring Provider: Phylliss Blakes, MD [id number] Physician Office [place],  [place] [address]  Oncologic History: 03/26/20: Stereotactic biopsy, EVD at [facility].  Path demonstrates Pilocytic Astrocytoma. Biomarkers:  MGMT Unknown.  IDH 1/2 Unknown.  EGFR Unknown  TERT Unknown   History of Present Illness: The patient's records from the referring physician were obtained and reviewed and the patient interviewed to confirm this HPI.  Kathleen Stone presented to medical attention in June 2021 with new onset progressive unilateral headaches.  Headaches were eventually associated with fever; due to suspicion for meningitis, CNS imaging was performed which demonstrated a tumor within the dorsal midbrain.  She underwent biopsy and temporary ventriculostomy at [facility] on 03/17/20 with Dr. Wende Mott.  Following surgery she reported left face, arm and leg weakness and left arm numbness.  These deficits have resolved though not completely; she still drags the left leg a little and has some numbness along the left arm.  Pathology report demonstrated WHO Grade I Pilocytic Astrocytoma.  MRI surveillance was recommended, although radiation therapy is under consideration per [facility] team.  Today she has no new complaints, no further headaches.  Medications: Current Outpatient Medications on File Prior to Visit  Medication Sig Dispense Refill  . amLODipine (NORVASC) 5 MG tablet Take 1 tablet by mouth daily.    Marland Kitchen gabapentin (NEURONTIN) 300 MG capsule TAKE ONE CAPSULE BY MOUTH TWICE  A DAY (Patient taking differently: Take 300 mg by mouth 2 (two) times daily. ) 60 capsule 2  . losartan (COZAAR) 50 MG tablet Take 1 tablet by mouth at bedtime.    . metoprolol tartrate (LOPRESSOR) 25 MG tablet TAKE ONE TABLET BY MOUTH TWICE A DAY 180 tablet 1  . acetaminophen (TYLENOL) 500 MG tablet Take 2 tablets by mouth every 4 (four) hours as needed. (Patient not taking: Reported on 06/16/2020)    . nitroGLYCERIN (NITROSTAT) 0.4 MG SL tablet Place 1 tablet (0.4 mg total) under the tongue every 5 (five) minutes as needed for chest pain. (Patient not taking: Reported on 06/16/2020) 25 tablet 1   No current facility-administered medications on file prior to visit.    Allergies:  Allergies  Allergen Reactions  . Iohexol Hives and Itching    Benadryl 36m PO one hour before iodinated contrast studies.  jkl   Past Medical History:  Past Medical History:  Diagnosis Date  . Chronic lower back pain    Negative MRI of hips bilaterally, neg CT scan of A and P; MRI of T and L spine-> very mild deg changes from T4-5 and T7-8, cervical spondylotic changes at C5-6, very mild disc bulge at L2-3 without spinal stenosis, nl ESR, ANA, RF, HIV, TSH, and B12  . DJD (degenerative joint disease) of cervical spine   . Family history of adverse reaction to anesthesia    "sisters PONV"  . Galactorrhea of both breasts 2009   R. sided w/ benign papilloma excised in 4/09. L sided in 5/09  . Gallstones   . Hypertension   . Migraine    "monthly" (11/15/2016)  . Neuropathy  Thought to be 2/2 nerve compression in the C-spine  . Obesity   . Personal history of adenomatous colonic polyp    09/2007 - diminutive adenoma [name])  . PONV (postoperative nausea and vomiting)   . Tobacco abuse    Past Surgical History:  Past Surgical History:  Procedure Laterality Date  . APPENDECTOMY    . BREAST SURGERY Right 12/2007   central duct excision  . CHOLECYSTECTOMY N/A 12/08/2019   Procedure: LAPAROSCOPIC  CHOLECYSTECTOMY;  Surgeon: Ralene Ok, MD;  Location: [facility];  Service: General;  Laterality: N/A;  . COLONOSCOPY W/ BIOPSIES AND POLYPECTOMY  09/2007   Dr. [name]. 2 small adenomatous polyps, internal hemorrhoids-Grade 1. Rec  routine colonoscopy at age 61.  Marland Kitchen COLONOSCOPY W/ BIOPSIES AND POLYPECTOMY    . NASAL SEPTOPLASTY W/ TURBINOPLASTY Bilateral 04/2008   Septoplasty with bilateral inferior turbinate reduction; Dr. Lucia Gaskins  . SHOULDER OPEN ROTATOR CUFF REPAIR Right     after hurting it by moving heavy objecs   Social History:  Social History   Socioeconomic History  . Marital status: Married    Spouse name: Not on file  . Number of children: Not on file  . Years of education: Not on file  . Highest education level: Not on file  Occupational History  . Not on file  Tobacco Use  . Smoking status: Current Every Day Smoker    Packs/day: 0.50    Years: 31.00    Pack years: 15.50    Types: Cigarettes  . Smokeless tobacco: Never Used  Vaping Use  . Vaping Use: Former  Substance and Sexual Activity  . Alcohol use: No    Alcohol/week: 0.0 standard drinks  . Drug use: No  . Sexual activity: Yes    Partners: Female  Other Topics Concern  . Not on file  Social History Narrative   Domestic Partner: female has emphysema   Smoked 2 packs/day for 20+ years, down to 1/2ppd now, willing to try to quit    Alcohol use-no   Drug use-no   Regular exercise-no   Social Determinants of Health   Financial Resource Strain:   . Difficulty of Paying Living Expenses: Not on file  Food Insecurity:   . Worried About Charity fundraiser in the Last Year: Not on file  . Ran Out of Food in the Last Year: Not on file  Transportation Needs:   . Lack of Transportation (Medical): Not on file  . Lack of Transportation (Non-Medical): Not on file  Physical Activity:   . Days of Exercise per Week: Not on file  . Minutes of Exercise per Session: Not on file  Stress:   . Feeling of Stress : Not  on file  Social Connections:   . Frequency of Communication with Friends and Family: Not on file  . Frequency of Social Gatherings with Friends and Family: Not on file  . Attends Religious Services: Not on file  . Active Member of Clubs or Organizations: Not on file  . Attends Archivist Meetings: Not on file  . Marital Status: Not on file  Intimate Partner Violence:   . Fear of Current or Ex-Partner: Not on file  . Emotionally Abused: Not on file  . Physically Abused: Not on file  . Sexually Abused: Not on file   Family History:  Family History  Problem Relation Age of Onset  . Heart failure Mother   . Diabetes Mother   . Breast cancer Other  aunt  . Liver cancer Other        aunt  . Lung cancer Father     Review of Systems: Constitutional: Doesn't report fevers, chills or abnormal weight loss Eyes: Doesn't report blurriness of vision Ears, nose, mouth, throat, and face: Doesn't report sore throat Respiratory: Doesn't report cough, dyspnea or wheezes Cardiovascular: Doesn't report palpitation, chest discomfort  Gastrointestinal:  Doesn't report nausea, constipation, diarrhea GU: Doesn't report incontinence Skin: Doesn't report skin rashes Neurological: Per HPI Musculoskeletal: Doesn't report joint pain Behavioral/Psych: Doesn't report anxiety  Physical Exam: Vitals:   06/16/20 0857  BP: (!) 159/70  Pulse: 67  Resp: 18  Temp: 98.2 F (36.8 C)  SpO2: 100%   KPS: 80. General: Alert, cooperative, pleasant, in no acute distress Head: Normal EENT: No conjunctival injection or scleral icterus.  Lungs: Resp effort normal Cardiac: Regular rate Abdomen: Non-distended abdomen Skin: No rashes cyanosis or petechiae. Extremities: No clubbing or edema  Neurologic Exam: Mental Status: Awake, alert, attentive to examiner. Oriented to self and environment. Language is fluent with intact comprehension.  Cranial Nerves: Visual acuity is grossly normal. Visual  fields are full. Extra-ocular movements intact. No ptosis. Face is symmetric Motor: Tone and bulk are normal. Very subtle drift of left arm with fixation, some lagging of left leg. Reflexes are symmetric, no pathologic reflexes present.  Sensory: Decreased left arm Gait: Hemiparetic   Labs: I have reviewed the data as listed    Component Value Date/Time   NA 139 02/29/2020 0741   NA 142 10/30/2019 1026   K 3.4 (L) 02/29/2020 0741   CL 107 02/29/2020 0741   CO2 25 02/29/2020 0741   GLUCOSE 140 (H) 02/29/2020 0741   BUN 15 02/29/2020 0741   BUN 14 10/30/2019 1026   CREATININE 0.98 02/29/2020 0741   CREATININE 0.82 07/24/2012 1019   CALCIUM 8.6 (L) 02/29/2020 0741   PROT 6.5 02/25/2020 0610   PROT 6.9 10/30/2019 1026   ALBUMIN 2.9 (L) 02/25/2020 0610   ALBUMIN 4.2 10/30/2019 1026   AST 38 02/25/2020 0610   ALT 32 02/25/2020 0610   ALKPHOS 82 02/25/2020 0610   BILITOT 1.0 02/25/2020 0610   BILITOT 0.5 10/30/2019 1026   GFRNONAA >60 02/29/2020 0741   GFRNONAA 85 07/24/2012 1019   GFRAA >60 02/29/2020 0741   GFRAA >89 07/24/2012 1019   Lab Results  Component Value Date   WBC 9.0 02/29/2020   NEUTROABS 3.6 02/24/2020   HGB 13.4 02/29/2020   HCT 41.3 02/29/2020   MCV 87.3 02/29/2020   PLT 256 02/29/2020    Imaging: CLINICAL DATA:  54 year old female presenting with worsening headache, subtle hyperdense CT lesion in the right midbrain.  EXAM: MRI HEAD WITHOUT AND WITH CONTRAST  TECHNIQUE: Multiplanar, multiecho pulse sequences of the brain and surrounding structures were obtained without and with intravenous contrast.  CONTRAST:  22m GADAVIST GADOBUTROL 1 MMOL/ML IV SOLN  COMPARISON:  Head CT earlier today, 05/09/2019, and earlier.  FINDINGS: Brain: Round T2 and FLAIR hyperintense, T1 hypointense, mass in the dorsal right midbrain measures up to 17 mm diameter (series 11, image 12), with heterogeneously facilitated diffusion (series 6, image 19).  Associated mass effect on the cerebral aqueduct which is mildly displaced posteriorly and to the left (series 16, image 22), although there is no ventriculomegaly or transependymal edema. There is no regional brainstem edema. And furthermore, this lesion was subtle but present on the CT in August 2020.  Following contrast the lesion demonstrates fairly homogeneous central  enhancement, with a surrounding T1 hypointense and T2 hyperintense rim (series 25, image 12). No vascular pulsation artifact or definite continuity with a regional vessel is identified. And the lesion does appear to be intra-axial as demonstrated by expansion of the dorsal right midbrain on both series 10, image 12 and series 9, image 12.  No other abnormal intracranial enhancement or similar brain lesion elsewhere.  Background brain volume appears normal for age. No restricted diffusion to suggest acute infarction. No midline shift, extra-axial fluid collection or acute intracranial hemorrhage. Cervicomedullary junction and pituitary are within normal limits.  Vascular: Major intracranial vascular flow voids are preserved.  Skull and upper cervical spine: Negative visible cervical spine. Visualized bone marrow signal is within normal limits.  Sinuses/Orbits: Disconjugate gaze, otherwise negative orbits. Paranasal Visualized paranasal sinuses and mastoids are stable and well pneumatized.  Other: Scalp and face soft tissues appear negative.  IMPRESSION: 1. 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 This does appear intra-axial although is without brainstem edema, and also without ventriculomegaly despite mass effect on the cerebral aqueduct.  The constellation is atypical for a high-grade glioma, solitary metastasis, or infection. But there are no secondary features of a high flow vascular malformation or an  infarct.  Other considerations would include more indolent neoplasm (such as low-grade glioma, ganglioglioma hemangioblastoma, etc) and an inflammatory lesion such as Neurosarcoidosis.  2. Recommend Neurosurgery consultation.   Pathology:   Assessment/Plan Pilocytic Astrocytoma  We appreciate the opportunity to participate in the care of Kathleen Stone.  She presents with clinical and radiographic syndrome consistent with dorsal midbrain pilocytic astrocytoma.    We suspect her initial symptomatology was not related to the tumor and that it was discovered incidentally.  From a clinical standpoint, it seems intraoperative infarct is likely etiology for immediate post-op deficits, although this was not confirmed on post-op imaging at [facility].  We discussed options for treatment going forward, and ultimately recommended short interval MRI surveillance.  She has MRI brain scheduled for January 2022 at [facility].  Screening for potential clinical trials was performed and discussed using eligibility criteria for active protocols at [facility], loco-regional tertiary centers, as well as national database available on [email or web address].com.    The patient is not a candidate for a research protocol at this time due to no suitable study identified.   We spent twenty additional minutes teaching regarding the natural history, biology, and historical experience in the treatment of brain tumors. We then discussed in detail the current recommendations for therapy focusing on the mode of administration, mechanism of action, anticipated toxicities, and quality of life issues associated with this plan. We also provided teaching sheets for the patient to take home as an additional resource.  All questions were answered. The patient knows to call the clinic with any problems, questions or concerns. No barriers to learning were detected.  She may follow up with [place] in January after meeting with her surgeon, if  desired.  The total time spent in the encounter was 60 minutes and more than 50% was on counseling and review of test results   Ventura Sellers, MD Medical Director of Neuro-Oncology [facility] at [place] 06/17/20 9:34 AM

## 2020-06-20 ENCOUNTER — Other Ambulatory Visit: Payer: Self-pay

## 2020-06-20 ENCOUNTER — Encounter: Payer: Self-pay | Admitting: Physical Therapy

## 2020-06-20 ENCOUNTER — Ambulatory Visit: Payer: BC Managed Care – PPO | Attending: Neurosurgery | Admitting: Physical Therapy

## 2020-06-20 DIAGNOSIS — R262 Difficulty in walking, not elsewhere classified: Secondary | ICD-10-CM | POA: Diagnosis not present

## 2020-06-20 DIAGNOSIS — M6281 Muscle weakness (generalized): Secondary | ICD-10-CM | POA: Diagnosis not present

## 2020-06-20 NOTE — Therapy (Signed)
Kathleen Stone. [place], [place], [address] Phone: [phone]   Fax:  [phone]  Physical Therapy Treatment  Patient Details  Name: Kathleen Stone MRN: [id number] Date of Birth: February 28, 1970 Referring Provider (PT): Wende Mott   Encounter Date: 06/20/2020   PT End of Session - 06/20/20 1701    Visit Number 4    Date for PT Re-Evaluation 08/08/20    PT Start Time 1615    PT Stop Time 1658    PT Time Calculation (min) 43 min    Activity Tolerance Patient tolerated treatment well    Behavior During Therapy [facility] for tasks assessed/performed           Past Medical History:  Diagnosis Date  . Chronic lower back pain    Negative MRI of hips bilaterally, neg CT scan of A and P; MRI of T and L spine-> very mild deg changes from T4-5 and T7-8, cervical spondylotic changes at C5-6, very mild disc bulge at L2-3 without spinal stenosis, nl ESR, ANA, RF, HIV, TSH, and B12  . DJD (degenerative joint disease) of cervical spine   . Family history of adverse reaction to anesthesia    "sisters PONV"  . Galactorrhea of both breasts 2009   R. sided w/ benign papilloma excised in 4/09. L sided in 5/09  . Gallstones   . Hypertension   . Migraine    "monthly" (11/15/2016)  . Neuropathy    Thought to be 2/2 nerve compression in the C-spine  . Obesity   . Personal history of adenomatous colonic polyp    09/2007 - diminutive adenoma [name])  . PONV (postoperative nausea and vomiting)   . Tobacco abuse     Past Surgical History:  Procedure Laterality Date  . APPENDECTOMY    . BREAST SURGERY Right 12/2007   central duct excision  . CHOLECYSTECTOMY N/A 12/08/2019   Procedure: LAPAROSCOPIC CHOLECYSTECTOMY;  Surgeon: Ralene Ok, MD;  Location: [place];  Service: General;  Laterality: N/A;  . COLONOSCOPY W/ BIOPSIES AND POLYPECTOMY  09/2007   Dr. [name]. 2 small adenomatous polyps, internal hemorrhoids-Grade 1. Rec  routine  colonoscopy at age 14.  Marland Kitchen COLONOSCOPY W/ BIOPSIES AND POLYPECTOMY    . NASAL SEPTOPLASTY W/ TURBINOPLASTY Bilateral 04/2008   Septoplasty with bilateral inferior turbinate reduction; Dr. Lucia Gaskins  . SHOULDER OPEN ROTATOR CUFF REPAIR Right     after hurting it by moving heavy objecs    There were no vitals filed for this visit.   Subjective Assessment - 06/20/20 1616    Subjective Pt states that she is feeling very sore from the other day    Currently in Pain? Yes    Pain Score 6    states sore "all over"                            [facility] Adult PT Treatment/Exercise - 06/20/20 0001      Knee/Hip Exercises: Aerobic   Nustep L5 x 6 min    Other Aerobic UBE L3 11fd/3bkwd      Knee/Hip Exercises: Machines for Strengthening   Cybex Knee Extension 10# BLE 1x10; 5# 1x10 single legs    Cybex Knee Flexion 20# 2x10 single legs    Other Machine rows and lats 20# 2x10      Knee/Hip Exercises: Standing   Forward Step Up Both;2 sets;10 reps;Hand Hold: 0;Step Height: 4";Step Height: 6"    Forward  Step Up Limitations some difficulty with foot clearance    Walking with Sports Cord 40# x4 each direction      Knee/Hip Exercises: Seated   Sit to Sand 2 sets;10 reps;without UE support   with yellow ball chest press                   PT Short Term Goals - 06/16/20 1638      PT SHORT TERM GOAL #1   Title Independent with inital HEP    Status Achieved      PT SHORT TERM GOAL #2   Title PAin reported improved 30% at work    Status Partially Met      PT SHORT TERM GOAL #3   Title reports sleeping longer due to use of pillow support for sleep posture    Status Partially Met             PT Long Term Goals - 06/08/20 1733      PT LONG TERM GOAL #1   Title She will be independent with all HEP issued as of last visit    Time 8    Period Weeks    Status New    Target Date 08/03/20      PT LONG TERM GOAL #2   Title Pt will report ability to return to full  work duties    Time 8    Period Weeks    Status New    Target Date 08/03/20      PT LONG TERM GOAL #3   Title Pt will demo ability to walk >500 ft over level/unlevel surfaces with no LOB or increased pain    Time 8    Period Weeks    Status New    Target Date 08/03/20                 Plan - 06/20/20 1701    Clinical Impression Statement Pt tolerated progression of ex well with no complaints of increased pain. Pt is noticabley weaker on L side; CGA needed with resisted gait. Occasional LOB with sidestepping requiring PT assist. Pt did well with step ups with no HH assist; cuing for foot clearance. Pt reports still having trouble with memory following surgery; discussed potential speech referral with pt VU.    PT Treatment/Interventions ADLs/Self Care Home Management;Electrical Stimulation;Moist Heat;Neuromuscular re-education;Balance training;Therapeutic exercise;Therapeutic activities;Functional mobility training;Stair training;Gait training;Patient/family education;Manual techniques    PT Next Visit Plan follow up on speech referral, LE strength, endurance ex's, functional strengthening    Consulted and Agree with Plan of Care Patient           Patient will benefit from skilled therapeutic intervention in order to improve the following deficits and impairments:  Abnormal gait, Difficulty walking, Decreased endurance, Decreased safety awareness, Obesity, Decreased activity tolerance, Pain, Improper body mechanics, Decreased mobility, Decreased strength, Decreased cognition  Visit Diagnosis: Muscle weakness (generalized)  Difficulty in walking, not elsewhere classified     Problem List Patient Active Problem List   Diagnosis Date Noted  . 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 Headache 02/24/2020  . Hematuria, microscopic 02/24/2020  . Adhesive capsulitis of right shoulder 01/27/2020  . Cholelithiasis 10/30/2019  . Hyperlipidemia 05/21/2019  . Abnormal stress test  01/16/2017  . Tingling of left upper extremity 11/15/2016  . Essential hypertension 06/13/2016  . Right knee pain 02/16/2016  . Lumbar radiculopathy, chronic 01/04/2016  . Personal history of adenomatous colonic polyp 08/17/2012  . Syncope 07/24/2012  .  Preventative health care 07/24/2012  . Depression 08/23/2011  . DEGENERATIVE DISC DISEASE, 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  . NEUROPATHY 07/28/2010  . Disturbance in sleep behavior 07/28/2010  . LOW BACK PAIN SYNDROME 07/10/2007  . TOBACCO ABUSE 10/07/2006   Amador Cunas, PT, DPT Donald Prose Rayette Mogg 06/20/2020, 5:03 PM  [place]. [place], [place], [address] Phone: [phone]   Fax:  [phone]  Name: KACEY DYSERT MRN: [id number] Date of Birth: [birth year]

## 2020-06-21 ENCOUNTER — Other Ambulatory Visit: Payer: Self-pay | Admitting: Internal Medicine

## 2020-06-21 DIAGNOSIS — R4189 Other symptoms and signs involving cognitive functions and awareness: Secondary | ICD-10-CM

## 2020-06-21 NOTE — Progress Notes (Signed)
DOS 06/13/20:  Internal Medicine Clinic Attending  Case discussed with Dr. Sharon Seller  At the time of the visit.  We reviewed the resident's history and exam and pertinent patient test results.  I agree with the assessment, diagnosis, and plan of care documented in the resident's note.

## 2020-06-22 ENCOUNTER — Encounter: Payer: Self-pay | Admitting: Physical Therapy

## 2020-06-22 ENCOUNTER — Ambulatory Visit: Payer: BC Managed Care – PPO | Admitting: Physical Therapy

## 2020-06-22 ENCOUNTER — Other Ambulatory Visit: Payer: Self-pay

## 2020-06-22 DIAGNOSIS — R262 Difficulty in walking, not elsewhere classified: Secondary | ICD-10-CM

## 2020-06-22 DIAGNOSIS — M6281 Muscle weakness (generalized): Secondary | ICD-10-CM

## 2020-06-22 NOTE — Therapy (Signed)
[place]. [place], [place], [address] Phone: [phone]   Fax:  [phone]  Physical Therapy Treatment  Patient Details  Name: Kathleen Stone MRN: [id number] Date of Birth: Jul 06, 1966 Referring Provider (PT): Wende Mott   Encounter Date: 06/22/2020   PT End of Session - 06/22/20 1650    Visit Number 5    Date for PT Re-Evaluation 08/08/20    PT Start Time 9892    PT Stop Time 1650    PT Time Calculation (min) 44 min    Activity Tolerance Patient tolerated treatment well    Behavior During Therapy [facility] for tasks assessed/performed           Past Medical History:  Diagnosis Date  . Chronic lower back pain    Negative MRI of hips bilaterally, neg CT scan of A and P; MRI of T and L spine-> very mild deg changes from T4-5 and T7-8, cervical spondylotic changes at C5-6, very mild disc bulge at L2-3 without spinal stenosis, nl ESR, ANA, RF, HIV, TSH, and B12  . DJD (degenerative joint disease) of cervical spine   . Family history of adverse reaction to anesthesia    "sisters PONV"  . Galactorrhea of both breasts 2009   R. sided w/ benign papilloma excised in 4/09. L sided in 5/09  . Gallstones   . Hypertension   . Migraine    "monthly" (11/15/2016)  . Neuropathy    Thought to be 2/2 nerve compression in the C-spine  . Obesity   . Personal history of adenomatous colonic polyp    09/2007 - diminutive adenoma [name])  . PONV (postoperative nausea and vomiting)   . Tobacco abuse     Past Surgical History:  Procedure Laterality Date  . APPENDECTOMY    . BREAST SURGERY Right 12/2007   central duct excision  . CHOLECYSTECTOMY N/A 12/08/2019   Procedure: LAPAROSCOPIC CHOLECYSTECTOMY;  Surgeon: Ralene Ok, MD;  Location: [place];  Service: General;  Laterality: N/A;  . COLONOSCOPY W/ BIOPSIES AND POLYPECTOMY  09/2007   Dr. [name]. 2 small adenomatous polyps, internal hemorrhoids-Grade 1. Rec  routine  colonoscopy at age 66.  Marland Kitchen COLONOSCOPY W/ BIOPSIES AND POLYPECTOMY    . NASAL SEPTOPLASTY W/ TURBINOPLASTY Bilateral 04/2008   Septoplasty with bilateral inferior turbinate reduction; Dr. Lucia Gaskins  . SHOULDER OPEN ROTATOR CUFF REPAIR Right     after hurting it by moving heavy objecs    There were no vitals filed for this visit.   Subjective Assessment - 06/22/20 1612    Subjective Pt states feeling good today, just a small headache    Currently in Pain? No/denies    Pain Score 0-No pain                             [facility] Adult PT Treatment/Exercise - 06/22/20 0001      Knee/Hip Exercises: Aerobic   Nustep L5 x 6 min    Other Aerobic UBE L3 30fd/3bkwd      Knee/Hip Exercises: Machines for Strengthening   Cybex Leg Press 20# BLE 2x10; 20# LLE 2x10    Other Machine shoulder ext 10# 2x10; rows and lats 20# 2x10      Knee/Hip Exercises: Standing   Forward Step Up Both;2 sets;10 reps;Hand Hold: 0;Step Height: 4";Step Height: 6"    Forward Step Up Limitations some difficulty with foot clearance    Other Standing Knee Exercises  alt marches 6" stair cues for light tap and LLE foot clearance      Knee/Hip Exercises: Seated   Sit to Sand 2 sets;10 reps;without UE support   yellow ball chest press from mat table                   PT Short Term Goals - 06/16/20 1638      PT SHORT TERM GOAL #1   Title Independent with inital HEP    Status Achieved      PT SHORT TERM GOAL #2   Title PAin reported improved 30% at work    Status Partially Met      PT SHORT TERM GOAL #3   Title reports sleeping longer due to use of pillow support for sleep posture    Status Partially Met             PT Long Term Goals - 06/08/20 1733      PT LONG TERM GOAL #1   Title She will be independent with all HEP issued as of last visit    Time 8    Period Weeks    Status New    Target Date 08/03/20      PT LONG TERM GOAL #2   Title Pt will report ability to return to  full work duties    Time 8    Period Weeks    Status New    Target Date 08/03/20      PT LONG TERM GOAL #3   Title Pt will demo ability to walk >500 ft over level/unlevel surfaces with no LOB or increased pain    Time 8    Period Weeks    Status New    Target Date 08/03/20                 Plan - 06/22/20 1650    Clinical Impression Statement Pt with no instances of LOB with stairs or alt marching this rx. Consistent difficulty and cues for LLE foot clearance with alt marches. Pt needing tactile/verbal cues and reinforcement for form with ex's; difficulty with carryover on how to complete ex's. Received referral for speech eval from primary MD; educated pt on role of speech therapy and how they could help with pt VU. Pt indicated she would follow up to schedule speech eval.    PT Treatment/Interventions ADLs/Self Care Home Management;Electrical Stimulation;Moist Heat;Neuromuscular re-education;Balance training;Therapeutic exercise;Therapeutic activities;Functional mobility training;Stair training;Gait training;Patient/family education;Manual techniques    PT Next Visit Plan follow up on speech referral, LE strength, endurance ex's, functional strengthening    Consulted and Agree with Plan of Care Patient           Patient will benefit from skilled therapeutic intervention in order to improve the following deficits and impairments:  Abnormal gait, Difficulty walking, Decreased endurance, Decreased safety awareness, Obesity, Decreased activity tolerance, Pain, Improper body mechanics, Decreased mobility, Decreased strength, Decreased cognition  Visit Diagnosis: Muscle weakness (generalized)  Difficulty in walking, not elsewhere classified     Problem List Patient Active Problem List   Diagnosis Date Noted  . 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 Headache 02/24/2020  . Hematuria, microscopic 02/24/2020  . Adhesive capsulitis of right shoulder 01/27/2020  .  Cholelithiasis 10/30/2019  . Hyperlipidemia 05/21/2019  . Abnormal stress test 01/16/2017  . Tingling of left upper extremity 11/15/2016  . Essential hypertension 06/13/2016  . Right knee pain 02/16/2016  . Lumbar radiculopathy, chronic 01/04/2016  . Personal history of  adenomatous colonic polyp 08/17/2012  . Syncope 07/24/2012  . Preventative health care 07/24/2012  . Depression 08/23/2011  . DEGENERATIVE DISC DISEASE, 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  . NEUROPATHY 07/28/2010  . Disturbance in sleep behavior 07/28/2010  . LOW BACK PAIN SYNDROME 07/10/2007  . TOBACCO ABUSE 10/07/2006   Amador Cunas, PT, DPT Donald Prose Katelee Schupp 06/22/2020, 4:54 PM  [place]. [place], [place], [address] Phone: [phone]   Fax:  [phone]  Name: MIONNA ADVINCULA MRN: [id number] Date of Birth: January 14, 1966

## 2020-06-24 ENCOUNTER — Encounter: Payer: Self-pay | Admitting: Internal Medicine

## 2020-06-24 DIAGNOSIS — E782 Mixed hyperlipidemia: Secondary | ICD-10-CM

## 2020-06-24 MED ORDER — ATORVASTATIN CALCIUM 40 MG PO TABS: 40.0000 mg | ORAL_TABLET | Freq: Every day | ORAL | 5 refills | Status: DC

## 2020-06-24 NOTE — Progress Notes (Signed)
Have attempted to call her with results multiple times. Left voicemail today and will send results with recommendations of starting atorvastatin 40 mg qd

## 2020-06-27 ENCOUNTER — Ambulatory Visit: Payer: BC Managed Care – PPO | Admitting: Physical Therapy

## 2020-06-27 ENCOUNTER — Encounter: Payer: Self-pay | Admitting: Physical Therapy

## 2020-06-27 ENCOUNTER — Other Ambulatory Visit: Payer: Self-pay

## 2020-06-27 DIAGNOSIS — M6281 Muscle weakness (generalized): Secondary | ICD-10-CM

## 2020-06-27 DIAGNOSIS — R262 Difficulty in walking, not elsewhere classified: Secondary | ICD-10-CM

## 2020-06-27 NOTE — Therapy (Signed)
[facility]. [place], [place], [address] Phone: [phone]   Fax:  [phone]  Physical Therapy Treatment  Patient Details  Name: Kathleen Stone MRN: [id number] Date of Birth: Feb 05, 1966 Referring Provider (PT): Wende Mott   Encounter Date: 06/27/2020   PT End of Session - 06/27/20 1707    Visit Number 6    Date for PT Re-Evaluation 08/08/20    PT Start Time 1612    PT Stop Time 1656    PT Time Calculation (min) 44 min    Activity Tolerance Patient tolerated treatment well    Behavior During Therapy [facility] for tasks assessed/performed           Past Medical History:  Diagnosis Date  . Chronic lower back pain    Negative MRI of hips bilaterally, neg CT scan of A and P; MRI of T and L spine-> very mild deg changes from T4-5 and T7-8, cervical spondylotic changes at C5-6, very mild disc bulge at L2-3 without spinal stenosis, nl ESR, ANA, RF, HIV, TSH, and B12  . DJD (degenerative joint disease) of cervical spine   . Family history of adverse reaction to anesthesia    "sisters PONV"  . Galactorrhea of both breasts 2009   R. sided w/ benign papilloma excised in 4/09. L sided in 5/09  . Gallstones   . Hypertension   . Migraine    "monthly" (11/15/2016)  . Neuropathy    Thought to be 2/2 nerve compression in the C-spine  . Obesity   . Personal history of adenomatous colonic polyp    09/2007 - diminutive adenoma [name])  . PONV (postoperative nausea and vomiting)   . Tobacco abuse     Past Surgical History:  Procedure Laterality Date  . APPENDECTOMY    . BREAST SURGERY Right 12/2007   central duct excision  . CHOLECYSTECTOMY N/A 12/08/2019   Procedure: LAPAROSCOPIC CHOLECYSTECTOMY;  Surgeon: Ralene Ok, MD;  Location: [place];  Service: General;  Laterality: N/A;  . COLONOSCOPY W/ BIOPSIES AND POLYPECTOMY  09/2007   Dr. [name]. 2 small adenomatous polyps, internal hemorrhoids-Grade 1. Rec  routine  colonoscopy at age 71.  Marland Kitchen COLONOSCOPY W/ BIOPSIES AND POLYPECTOMY    . NASAL SEPTOPLASTY W/ TURBINOPLASTY Bilateral 04/2008   Septoplasty with bilateral inferior turbinate reduction; Dr. Lucia Gaskins  . SHOULDER OPEN ROTATOR CUFF REPAIR Right     after hurting it by moving heavy objecs    There were no vitals filed for this visit.   Subjective Assessment - 06/27/20 1616    Subjective Pt reports doing well today    Currently in Pain? No/denies                             [facility] Adult PT Treatment/Exercise - 06/27/20 0001      Knee/Hip Exercises: Stretches   Gastroc Stretch Both;1 rep;20 seconds      Knee/Hip Exercises: Aerobic   Nustep L5 x 6 min    Other Aerobic UBE L3 75fd/3bkwd      Knee/Hip Exercises: Machines for Strengthening   Cybex Knee Extension 10#, 15# BLE 2x10    Cybex Knee Flexion 35# 2x10 BLE    Other Machine shoulder ext 10# 2x10; rows and lats 20# 2x10      Knee/Hip Exercises: Standing   Heel Raises Both;1 set;10 reps    Walking with Sports Cord 40# x4 each direction  PT Short Term Goals - 06/16/20 1638      PT SHORT TERM GOAL #1   Title Independent with inital HEP    Status Achieved      PT SHORT TERM GOAL #2   Title PAin reported improved 30% at work    Status Partially Met      PT [place] #3   Title reports sleeping longer due to use of pillow support for sleep posture    Status Partially Met             PT Long Term Goals - 06/08/20 1733      PT LONG TERM GOAL #1   Title She will be independent with all HEP issued as of last visit    Time 8    Period Weeks    Status New    Target Date 08/03/20      PT LONG TERM GOAL #2   Title Pt will report ability to return to full work duties    Time 8    Period Weeks    Status New    Target Date 08/03/20      PT LONG TERM GOAL #3   Title Pt will demo ability to walk >500 ft over level/unlevel surfaces with no LOB or increased pain    Time 8     Period Weeks    Status New    Target Date 08/03/20                 Plan - 06/27/20 1707    Clinical Impression Statement Pt tolerated progression of TE well. Required cuing for posture/form esp with standing shoulder extensions. At end of session, pt asking about how much she is allowed to do at work. Pt states she has to lift up to 30#, typically large cones are heaviest items. Will work on carrying and lifting next rx in order for pt to return to full work duties.    PT Treatment/Interventions ADLs/Self Care Home Management;Electrical Stimulation;Moist Heat;Neuromuscular re-education;Balance training;Therapeutic exercise;Therapeutic activities;Functional mobility training;Stair training;Gait training;Patient/family education;Manual techniques    PT Next Visit Plan LE strength, endurance ex's, functional work activities    Consulted and Agree with Plan of Care Patient           Patient will benefit from skilled therapeutic intervention in order to improve the following deficits and impairments:  Abnormal gait, Difficulty walking, Decreased endurance, Decreased safety awareness, Obesity, Decreased activity tolerance, Pain, Improper body mechanics, Decreased mobility, Decreased strength, Decreased cognition  Visit Diagnosis: Muscle weakness (generalized)  Difficulty in walking, not elsewhere classified     Problem List Patient Active Problem List   Diagnosis Date Noted  . 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 Headache 02/24/2020  . Hematuria, microscopic 02/24/2020  . Adhesive capsulitis of right shoulder 01/27/2020  . Cholelithiasis 10/30/2019  . Hyperlipidemia 05/21/2019  . Abnormal stress test 01/16/2017  . Tingling of left upper extremity 11/15/2016  . Essential hypertension 06/13/2016  . Right knee pain 02/16/2016  . Lumbar radiculopathy, chronic 01/04/2016  . Personal history of adenomatous colonic polyp 08/17/2012  . Syncope 07/24/2012  . Preventative  health care 07/24/2012  . Depression 08/23/2011  . DEGENERATIVE DISC DISEASE, 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  . NEUROPATHY 07/28/2010  . Disturbance in sleep behavior 07/28/2010  . LOW BACK PAIN SYNDROME 07/10/2007  . TOBACCO ABUSE 10/07/2006   Amador Cunas, PT, DPT Donald Prose Mackie Holness 06/27/2020, 5:11 PM  [facility] [address]  [place]. [place], [place], [address] Phone: [phone]   Fax:  [phone]  Name: Kathleen Stone MRN: [id number] Date of Birth: [birth year]

## 2020-06-28 ENCOUNTER — Ambulatory Visit: Payer: BC Managed Care – PPO | Admitting: Physical Therapy

## 2020-06-29 ENCOUNTER — Ambulatory Visit: Payer: BC Managed Care – PPO | Admitting: Physical Therapy

## 2020-07-01 ENCOUNTER — Ambulatory Visit
Admission: RE | Admit: 2020-07-01 | Discharge: 2020-07-01 | Disposition: A | Discharge status: Home / Self Care | Payer: BC Managed Care – PPO | Source: Ambulatory Visit | Attending: Internal Medicine | Admitting: Internal Medicine

## 2020-07-01 ENCOUNTER — Other Ambulatory Visit: Payer: Self-pay

## 2020-07-01 DIAGNOSIS — Z1231 Encounter for screening mammogram for malignant neoplasm of breast: Secondary | ICD-10-CM

## 2020-07-05 ENCOUNTER — Other Ambulatory Visit: Payer: Self-pay

## 2020-07-05 ENCOUNTER — Encounter: Payer: Self-pay | Admitting: Physical Therapy

## 2020-07-05 ENCOUNTER — Encounter: Payer: Self-pay | Admitting: *Deleted

## 2020-07-05 ENCOUNTER — Ambulatory Visit: Payer: BC Managed Care – PPO | Admitting: Physical Therapy

## 2020-07-05 DIAGNOSIS — M6281 Muscle weakness (generalized): Secondary | ICD-10-CM | POA: Diagnosis not present

## 2020-07-05 DIAGNOSIS — R262 Difficulty in walking, not elsewhere classified: Secondary | ICD-10-CM | POA: Diagnosis not present

## 2020-07-05 NOTE — Therapy (Signed)
[place]. [place], [place], [address] Phone: [phone]   Fax:  [phone]  Physical Therapy Treatment  Patient Details  Name: Kathleen Stone MRN: [id number] Date of Birth: 01-17-1966 Referring Provider (PT): Wende Mott   Encounter Date: 07/05/2020   PT End of Session - 07/05/20 1550    Visit Number 7    Date for PT Re-Evaluation 08/08/20    PT Start Time 6962    PT Stop Time 1558    PT Time Calculation (min) 43 min    Activity Tolerance Patient tolerated treatment well    Behavior During Therapy [facility] for tasks assessed/performed           Past Medical History:  Diagnosis Date   Chronic lower back pain    Negative MRI of hips bilaterally, neg CT scan of A and P; MRI of T and L spine-> very mild deg changes from T4-5 and T7-8, cervical spondylotic changes at C5-6, very mild disc bulge at L2-3 without spinal stenosis, nl ESR, ANA, RF, HIV, TSH, and B12   DJD (degenerative joint disease) of cervical spine    Family history of adverse reaction to anesthesia    "sisters PONV"   Galactorrhea of both breasts 2009   R. sided w/ benign papilloma excised in 4/09. L sided in 5/09   Gallstones    Hypertension    Migraine    "monthly" (11/15/2016)   Neuropathy    Thought to be 2/2 nerve compression in the C-spine   Obesity    Personal history of adenomatous colonic polyp    09/2007 - diminutive adenoma [name])   PONV (postoperative nausea and vomiting)    Tobacco abuse     Past Surgical History:  Procedure Laterality Date   APPENDECTOMY     BREAST SURGERY Right 12/2007   central duct excision   CHOLECYSTECTOMY N/A 12/08/2019   Procedure: LAPAROSCOPIC CHOLECYSTECTOMY;  Surgeon: Ralene Ok, MD;  Location: [place];  Service: General;  Laterality: N/A;   COLONOSCOPY W/ BIOPSIES AND POLYPECTOMY  09/2007   Dr. [name]. 2 small adenomatous polyps, internal hemorrhoids-Grade 1. Rec  routine  colonoscopy at age 50.   COLONOSCOPY W/ BIOPSIES AND POLYPECTOMY     NASAL SEPTOPLASTY W/ TURBINOPLASTY Bilateral 04/2008   Septoplasty with bilateral inferior turbinate reduction; Dr. Lucia Gaskins   SHOULDER OPEN ROTATOR CUFF REPAIR Right     after hurting it by moving heavy objecs    There were no vitals filed for this visit.   Subjective Assessment - 07/05/20 1518    Subjective "Just tired"    Currently in Pain? No/denies                             [facility] Adult PT Treatment/Exercise - 07/05/20 0001      Knee/Hip Exercises: Aerobic   Nustep L5 x 6 min    Other Aerobic UBE L1.3 15fd/3bkwd      Knee/Hip Exercises: Machines for Strengthening   Cybex Knee Extension 15# BLE 2x10    Cybex Knee Flexion 35# 2x10 BLE    Cybex Leg Press 40lb 2x10     Other Machine shoulder ext 15# 2x10; rows and lats 35# 2x10      Knee/Hip Exercises: Standing   Forward Step Up Both;2 sets;10 reps;Hand Hold: 0;Step Height: 6"    Forward Step Up Limitations some difficulty with foot clearance    Other Standing Knee Exercises AR  press 20lb 2x10                     PT Short Term Goals - 06/16/20 1638      PT SHORT TERM GOAL #1   Title Independent with inital HEP    Status Achieved      PT SHORT TERM GOAL #2   Title PAin reported improved 30% at work    Status Partially Met      PT SHORT TERM GOAL #3   Title reports sleeping longer due to use of pillow support for sleep posture    Status Partially Met             PT Long Term Goals - 06/08/20 1733      PT LONG TERM GOAL #1   Title She will be independent with all HEP issued as of last visit    Time 8    Period Weeks    Status New    [place] Date 08/03/20      PT LONG TERM GOAL #2   Title Pt will report ability to return to full work duties    Time 8    Period Weeks    Status New    [place] Date 08/03/20      PT LONG TERM GOAL #3   Title Pt will demo ability to walk >500 ft over level/unlevel surfaces  with no LOB or increased pain    Time 8    Period Weeks    Status New    Target Date 08/03/20                 Plan - 07/05/20 1551    Clinical Impression Statement Pt did well overall today.Increase weight tolerated with machine level interventions. She had some weakness present with 6 inch step ups. Tactile cues needed to reinforce correct posture with standing shoulder extensions.    Personal Factors and Comorbidities Comorbidity 2    Comorbidities HTN, hx of LBP    Examination-Activity Limitations Stairs;Stand;Locomotion Level;Transfers    Examination-Participation Restrictions Community Activity;Interpersonal Relationship    Stability/Clinical Decision Making Evolving/Moderate complexity    Rehab Potential Good    PT Frequency 1x / week   Spoke to lead PT will decrease to 1x/week   PT Duration 8 weeks    PT Treatment/Interventions ADLs/Self Care Home Management;Electrical Stimulation;Moist Heat;Neuromuscular re-education;Balance training;Therapeutic exercise;Therapeutic activities;Functional mobility training;Stair training;Gait training;Patient/family education;Manual techniques    PT Next Visit Plan LE strength, endurance ex's, functional work activities           Patient will benefit from skilled therapeutic intervention in order to improve the following deficits and impairments:  Abnormal gait, Difficulty walking, Decreased endurance, Decreased safety awareness, Obesity, Decreased activity tolerance, Pain, Improper body mechanics, Decreased mobility, Decreased strength, Decreased cognition  Visit Diagnosis: Difficulty in walking, not elsewhere classified  Muscle weakness (generalized)     Problem List Patient Active Problem List   Diagnosis Date Noted   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   Headache 02/24/2020   Hematuria, microscopic 02/24/2020   Adhesive capsulitis of right shoulder 01/27/2020   Cholelithiasis 10/30/2019   Hyperlipidemia 05/21/2019    Abnormal stress test 01/16/2017   Tingling of left upper extremity 11/15/2016   Essential hypertension 06/13/2016   Right knee pain 02/16/2016   Lumbar radiculopathy, chronic 01/04/2016   Personal history of adenomatous colonic polyp 08/17/2012   Syncope 07/24/2012   Preventative health care 07/24/2012   Depression 08/23/2011   DEGENERATIVE DISC  DISEASE, 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   NEUROPATHY 07/28/2010   Disturbance in sleep behavior 07/28/2010   LOW BACK PAIN SYNDROME 07/10/2007   TOBACCO ABUSE 10/07/2006    Scot Jun, PTA 07/05/2020, 3:55 PM  [place]. [place], [place], [address] Phone: [phone]   Fax:  [phone]  Name: RHEDA KASSAB MRN: [id number] Date of Birth: [birth year]

## 2020-07-11 ENCOUNTER — Other Ambulatory Visit: Payer: Self-pay

## 2020-07-11 ENCOUNTER — Ambulatory Visit: Payer: BC Managed Care – PPO | Admitting: Physical Therapy

## 2020-07-11 ENCOUNTER — Encounter: Payer: Self-pay | Admitting: Internal Medicine

## 2020-07-11 ENCOUNTER — Encounter: Payer: Self-pay | Admitting: Physical Therapy

## 2020-07-11 DIAGNOSIS — M6281 Muscle weakness (generalized): Secondary | ICD-10-CM

## 2020-07-11 DIAGNOSIS — R262 Difficulty in walking, not elsewhere classified: Secondary | ICD-10-CM

## 2020-07-11 NOTE — Therapy (Signed)
[place]. [place], [place], [address] Phone: [phone]   Fax:  [phone]  Physical Therapy Treatment  Patient Details  Name: Kathleen Stone MRN: [id number] Date of Birth: 05/15/1966 Referring Provider (PT): Wende Mott   Encounter Date: 07/11/2020   PT End of Session - 07/11/20 1548    Visit Number 8    Date for PT Re-Evaluation 08/08/20    PT Start Time 6962    PT Stop Time 0341    PT Time Calculation (min) 750 min    Activity Tolerance Patient tolerated treatment well    Behavior During Therapy [facility] for tasks assessed/performed           Past Medical History:  Diagnosis Date  . Chronic lower back pain    Negative MRI of hips bilaterally, neg CT scan of A and P; MRI of T and L spine-> very mild deg changes from T4-5 and T7-8, cervical spondylotic changes at C5-6, very mild disc bulge at L2-3 without spinal stenosis, nl ESR, ANA, RF, HIV, TSH, and B12  . DJD (degenerative joint disease) of cervical spine   . Family history of adverse reaction to anesthesia    "sisters PONV"  . Galactorrhea of both breasts 2009   R. sided w/ benign papilloma excised in 4/09. L sided in 5/09  . Gallstones   . Hypertension   . Migraine    "monthly" (11/15/2016)  . Neuropathy    Thought to be 2/2 nerve compression in the C-spine  . Obesity   . Personal history of adenomatous colonic polyp    09/2007 - diminutive adenoma [name])  . PONV (postoperative nausea and vomiting)   . Tobacco abuse     Past Surgical History:  Procedure Laterality Date  . APPENDECTOMY    . BREAST SURGERY Right 12/2007   central duct excision  . CHOLECYSTECTOMY N/A 12/08/2019   Procedure: LAPAROSCOPIC CHOLECYSTECTOMY;  Surgeon: Ralene Ok, MD;  Location: [place];  Service: General;  Laterality: N/A;  . COLONOSCOPY W/ BIOPSIES AND POLYPECTOMY  09/2007   Dr. [name]. 2 small adenomatous polyps, internal hemorrhoids-Grade 1. Rec  routine  colonoscopy at age 55.  Marland Kitchen COLONOSCOPY W/ BIOPSIES AND POLYPECTOMY    . NASAL SEPTOPLASTY W/ TURBINOPLASTY Bilateral 04/2008   Septoplasty with bilateral inferior turbinate reduction; Dr. Lucia Gaskins  . SHOULDER OPEN ROTATOR CUFF REPAIR Right     after hurting it by moving heavy objecs    There were no vitals filed for this visit.   Subjective Assessment - 07/11/20 1515    Subjective Reports to PT today feeling tired and has tingling in both hands but no c/o pain.    Currently in Pain? No/denies    Pain Score 0-No pain                             [facility] Adult PT Treatment/Exercise - 07/11/20 0001      Exercises   Exercises Lumbar      Lumbar Exercises: Machines for Strengthening   Cybex Knee Extension #25 2x10     Cybex Knee Flexion #35 2x10       Lumbar Exercises: Standing   Other Standing Lumbar Exercises standing trunk rot pulls 2x10 ea side       Knee/Hip Exercises: Aerobic   Nustep L5 x6 mins    Other Aerobic UBE L2 fwrd/bkw 83mns ea       Knee/Hip Exercises: Machines  for Strengthening   Other Machine shoulder rows #45, 2x10, lat pull downs #35 2x10, standing tricep ext. #35 2x10                    PT Short Term Goals - 06/16/20 1638      PT SHORT TERM GOAL #1   Title Independent with inital HEP    Status Achieved      PT SHORT TERM GOAL #2   Title PAin reported improved 30% at work    Status Partially Met      PT [place] #3   Title reports sleeping longer due to use of pillow support for sleep posture    Status Partially Met             PT Long Term Goals - 07/11/20 1544      PT LONG TERM GOAL #1   Title She will be independent with all HEP issued as of last visit    Status Achieved      PT LONG TERM GOAL #2   Title Pt will report ability to return to full work duties    Status Partially Met      PT LONG TERM GOAL #3   Title Pt will demo ability to walk >500 ft over level/unlevel surfaces with no LOB or increased  pain    Status Achieved                 Plan - 07/11/20 1549    Clinical Impression Statement Patient did wiell with increased weights on the machines today. Verbal and tacile cues needed to have correct technique with tricep ext. and standing trunk rot. pulls.  Mentioned decresed sensation in her left hand today that did not worsen throughout treatment.    PT Treatment/Interventions ADLs/Self Care Home Management;Electrical Stimulation;Moist Heat;Neuromuscular re-education;Balance training;Therapeutic exercise;Therapeutic activities;Functional mobility training;Stair training;Gait training;Patient/family education;Manual techniques    PT Next Visit Plan LE strength, endurance ex's, functional work activities    PT [place] more hip,knee, and core dynamic exercises           Patient will benefit from skilled therapeutic intervention in order to improve the following deficits and impairments:  Abnormal gait, Difficulty walking, Decreased endurance, Decreased safety awareness, Obesity, Decreased activity tolerance, Pain, Improper body mechanics, Decreased mobility, Decreased strength, Decreased cognition  Visit Diagnosis: Difficulty in walking, not elsewhere classified  Muscle weakness (generalized)     Problem List Patient Active Problem List   Diagnosis Date Noted  . 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 Headache 02/24/2020  . Hematuria, microscopic 02/24/2020  . Adhesive capsulitis of right shoulder 01/27/2020  . Cholelithiasis 10/30/2019  . Hyperlipidemia 05/21/2019  . Abnormal stress test 01/16/2017  . Tingling of left upper extremity 11/15/2016  . Essential hypertension 06/13/2016  . Right knee pain 02/16/2016  . Lumbar radiculopathy, chronic 01/04/2016  . Personal history of adenomatous colonic polyp 08/17/2012  . Syncope 07/24/2012  . Preventative health care 07/24/2012  . Depression 08/23/2011  . DEGENERATIVE DISC DISEASE, CERVICAL  SPINE 08/04/2010  . NEUROPATHY 07/28/2010  . Disturbance in sleep behavior 07/28/2010  . LOW BACK PAIN SYNDROME 07/10/2007  . TOBACCO ABUSE 10/07/2006    Lavenia Atlas, SPTA 07/11/2020, 3:57 PM  [place]. [place], [place], [address] Phone: [phone]   Fax:  [phone]  Name: DANAYE SOBH MRN: [id number] Date of Birth: 1966-09-08

## 2020-07-18 ENCOUNTER — Encounter: Payer: Self-pay | Admitting: Physical Therapy

## 2020-07-18 ENCOUNTER — Other Ambulatory Visit: Payer: Self-pay

## 2020-07-18 ENCOUNTER — Ambulatory Visit: Payer: BC Managed Care – PPO | Attending: Neurosurgery | Admitting: Physical Therapy

## 2020-07-18 DIAGNOSIS — R262 Difficulty in walking, not elsewhere classified: Secondary | ICD-10-CM | POA: Diagnosis not present

## 2020-07-18 DIAGNOSIS — M6281 Muscle weakness (generalized): Secondary | ICD-10-CM | POA: Diagnosis not present

## 2020-07-18 NOTE — Therapy (Signed)
[place]. [place], [place], [address] Phone: [phone]   Fax:  [phone]  Physical Therapy Treatment  Patient Details  Name: Kathleen Stone MRN: [id number] Date of Birth: 12-25-1965 Referring Provider (PT): Wende Mott   Encounter Date: 07/18/2020   PT End of Session - 07/18/20 1635    Visit Number 9    Date for PT Re-Evaluation 08/08/20    PT Start Time 1600    PT Stop Time 1635    PT Time Calculation (min) 35 min    Activity Tolerance Patient tolerated treatment well    Behavior During Therapy [facility] for tasks assessed/performed           Past Medical History:  Diagnosis Date  . Chronic lower back pain    Negative MRI of hips bilaterally, neg CT scan of A and P; MRI of T and L spine-> very mild deg changes from T4-5 and T7-8, cervical spondylotic changes at C5-6, very mild disc bulge at L2-3 without spinal stenosis, nl ESR, ANA, RF, HIV, TSH, and B12  . DJD (degenerative joint disease) of cervical spine   . Family history of adverse reaction to anesthesia    "sisters PONV"  . Galactorrhea of both breasts 2009   R. sided w/ benign papilloma excised in 4/09. L sided in 5/09  . Gallstones   . Hypertension   . Migraine    "monthly" (11/15/2016)  . Neuropathy    Thought to be 2/2 nerve compression in the C-spine  . Obesity   . Personal history of adenomatous colonic polyp    09/2007 - diminutive adenoma [name])  . PONV (postoperative nausea and vomiting)   . Tobacco abuse     Past Surgical History:  Procedure Laterality Date  . APPENDECTOMY    . BREAST SURGERY Right 12/2007   central duct excision  . CHOLECYSTECTOMY N/A 12/08/2019   Procedure: LAPAROSCOPIC CHOLECYSTECTOMY;  Surgeon: Ralene Ok, MD;  Location: [place];  Service: General;  Laterality: N/A;  . COLONOSCOPY W/ BIOPSIES AND POLYPECTOMY  09/2007   Dr. [name]. 2 small adenomatous polyps, internal hemorrhoids-Grade 1. Rec  routine  colonoscopy at age 54.  Marland Kitchen COLONOSCOPY W/ BIOPSIES AND POLYPECTOMY    . NASAL SEPTOPLASTY W/ TURBINOPLASTY Bilateral 04/2008   Septoplasty with bilateral inferior turbinate reduction; Dr. Lucia Gaskins  . SHOULDER OPEN ROTATOR CUFF REPAIR Right     after hurting it by moving heavy objecs    There were no vitals filed for this visit.   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 Pt reports a fall yesterday in her closet hanging up clothes, no injuries sustained    Currently in Pain? No/denies                             [facility] Adult PT Treatment/Exercise - 07/18/20 0001      Knee/Hip Exercises: Aerobic   Nustep L5 x6 mins    Other Aerobic UBE L2 fwrd/bkw 46mns ea       Knee/Hip Exercises: Machines for Strengthening   Cybex Knee Extension #20 2x15     Cybex Knee Flexion #35 2x15    Cybex Leg Press 40lb 2x10     Other Machine shoulder rows #45, 2x10, lat pull downs #35 2x10, standing tricep ext. #35 2x10  PT Short Term Goals - 06/16/20 1638      PT SHORT TERM GOAL #1   Title Independent with inital HEP    Status Achieved      PT SHORT TERM GOAL #2   Title PAin reported improved 30% at work    Status Partially Met      PT [place] #3   Title reports sleeping longer due to use of pillow support for sleep posture    Status Partially Met             PT Long Term Goals - 07/11/20 1544      PT LONG TERM GOAL #1   Title She will be independent with all HEP issued as of last visit    Status Achieved      PT LONG TERM GOAL #2   Title Pt will report ability to return to full work duties    Status Partially Met      PT [place] #3   Title Pt will demo ability to walk >500 ft over level/unlevel surfaces with no LOB or increased pain    Status Achieved                 Plan - 07/18/20 1637    Clinical  Impression Statement No issue with today's exercises. Tactile cues needed for posture with seated rows. No reports of pain. Cues for pacing with leg curls.    Personal Factors and Comorbidities Comorbidity 2    Comorbidities HTN, hx of LBP    Examination-Activity Limitations Stairs;Stand;Locomotion Level;Transfers    Examination-Participation Restrictions Community Activity;Interpersonal Relationship    Stability/Clinical Decision Making Evolving/Moderate complexity    Rehab Potential Good    PT Frequency 1x / week    PT Duration 8 weeks    PT Treatment/Interventions ADLs/Self Care Home Management;Electrical Stimulation;Moist Heat;Neuromuscular re-education;Balance training;Therapeutic exercise;Therapeutic activities;Functional mobility training;Stair training;Gait training;Patient/family education;Manual techniques    PT Next Visit Plan LE strength, endurance ex's, functional work activities, possible D/C next visit           Patient will benefit from skilled therapeutic intervention in order to improve the following deficits and impairments:  Abnormal gait, Difficulty walking, Decreased endurance, Decreased safety awareness, Obesity, Decreased activity tolerance, Pain, Improper body mechanics, Decreased mobility, Decreased strength, Decreased cognition  Visit Diagnosis: Muscle weakness (generalized)  Difficulty in walking, not elsewhere classified     Problem List Patient Active Problem List   Diagnosis Date Noted  . 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 Headache 02/24/2020  . Hematuria, microscopic 02/24/2020  . Adhesive capsulitis of right shoulder 01/27/2020  . Cholelithiasis 10/30/2019  . Hyperlipidemia 05/21/2019  . Abnormal stress test 01/16/2017  . Tingling of left upper extremity 11/15/2016  . Essential hypertension 06/13/2016  . Right knee pain 02/16/2016  . Lumbar radiculopathy, chronic 01/04/2016  . Personal history of adenomatous colonic polyp 08/17/2012  .  Syncope 07/24/2012  . Preventative health care 07/24/2012  . Depression 08/23/2011  . DEGENERATIVE DISC DISEASE, 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  . NEUROPATHY 07/28/2010  . Disturbance in sleep behavior 07/28/2010  . LOW BACK PAIN SYNDROME 07/10/2007  . TOBACCO ABUSE 10/07/2006    Scot Jun, PTA 07/18/2020, 4:40 PM  [place]. [place], [place], [address] Phone: [phone]   Fax:  [phone]  Name: HEIRESS WILLIAMSON MRN: [id number] Date of Birth: 07/25/1966

## 2020-07-19 ENCOUNTER — Ambulatory Visit: Payer: BC Managed Care – PPO | Admitting: Speech Pathology

## 2020-07-25 ENCOUNTER — Ambulatory Visit: Payer: BC Managed Care – PPO | Admitting: Physical Therapy

## 2020-07-25 ENCOUNTER — Other Ambulatory Visit: Payer: Self-pay

## 2020-07-25 ENCOUNTER — Encounter: Payer: Self-pay | Admitting: Physical Therapy

## 2020-07-25 DIAGNOSIS — M6281 Muscle weakness (generalized): Secondary | ICD-10-CM | POA: Diagnosis not present

## 2020-07-25 DIAGNOSIS — R262 Difficulty in walking, not elsewhere classified: Secondary | ICD-10-CM

## 2020-07-25 NOTE — Therapy (Cosign Needed)
[place] [place], [place], [address] Phone: [phone]   Fax:  [phone]  Physical Therapy Treatment  Patient Details  Name: Kathleen Stone MRN: [id number] Date of Birth: 12/08/1965 Referring Provider (PT): Wende Mott   Encounter Date: 07/25/2020   PT End of Session - 07/25/20 1642    Visit Number 10    Date for PT Re-Evaluation 08/08/20    PT Start Time 1600    PT Stop Time 1645    PT Time Calculation (min) 45 min    Activity Tolerance Patient tolerated treatment well    Behavior During Therapy [facility] for tasks assessed/performed           Past Medical History:  Diagnosis Date  . Chronic lower back pain    Negative MRI of hips bilaterally, neg CT scan of A and P; MRI of T and L spine-> very mild deg changes from T4-5 and T7-8, cervical spondylotic changes at C5-6, very mild disc bulge at L2-3 without spinal stenosis, nl ESR, ANA, RF, HIV, TSH, and B12  . DJD (degenerative joint disease) of cervical spine   . Family history of adverse reaction to anesthesia    "sisters PONV"  . Galactorrhea of both breasts 2009   R. sided w/ benign papilloma excised in 4/09. L sided in 5/09  . Gallstones   . Hypertension   . Migraine    "monthly" (11/15/2016)  . Neuropathy    Thought to be 2/2 nerve compression in the C-spine  . Obesity   . Personal history of adenomatous colonic polyp    09/2007 - diminutive adenoma Kathleen Stone     Past Surgical History:  Procedure Laterality Date  . APPENDECTOMY    . BREAST SURGERY Right 12/2007   central duct excision  . CHOLECYSTECTOMY N/A 12/08/2019   Procedure: LAPAROSCOPIC CHOLECYSTECTOMY;  Surgeon: Ralene Ok, MD;  Location: [place];  Service: General;  Laterality: N/A;  . COLONOSCOPY W/ BIOPSIES AND POLYPECTOMY  09/2007   Kathleen Stone. 2 small adenomatous polyps, internal hemorrhoids-Grade 1. Rec  routine  colonoscopy at age 38.  Marland Kitchen COLONOSCOPY W/ BIOPSIES AND POLYPECTOMY    . NASAL SEPTOPLASTY W/ TURBINOPLASTY Bilateral 04/2008   Septoplasty with bilateral inferior turbinate reduction; Dr. Lucia Gaskins  . SHOULDER OPEN ROTATOR CUFF REPAIR Right     after hurting it by moving heavy objecs    There were no vitals filed for this visit.   Subjective Assessment - 07/25/20 1606    Subjective Doing well today. [place] out of a chair at work today, but she did not get hurt.    Currently in Pain? Yes    Pain Score 1                              [facility] Adult PT Treatment/Exercise - 07/25/20 0001      Ambulation/Gait   Ambulation/Gait Yes    Ambulation/Gait Assistance 5: Supervision    Ambulation Surface Outdoor;Unlevel;Grass;Paved    Gait Comments Had to take one rest break half way up slight hill incline       Knee/Hip Exercises: Aerobic   Nustep L5 49mns     Other Aerobic UBE L2, 3 mins ea way      Knee/Hip Exercises: Machines for Strengthening   Cybex Knee Extension #25 2x15    Cybex Knee Flexion #35 2x15  Cybex Leg Press #60 2x10    Other Machine Rows #45 2x12, Lats #35 2x12                    PT Short Term Goals - 06/16/20 1638      PT SHORT TERM GOAL #1   Title Independent with inital HEP    Status Achieved      PT SHORT TERM GOAL #2   Title PAin reported improved 30% at work    Status Partially Met      PT SHORT TERM GOAL #3   Title reports sleeping longer due to use of pillow support for sleep posture    Status Partially Met             PT Long Term Goals - 07/25/20 1624      PT LONG TERM GOAL #1   Title She will be independent with all HEP issued as of last visit    Status Achieved      PT LONG TERM GOAL #2   Title Pt will report ability to return to full work duties    Baseline Waiting for clearance from the Dr.    [name] Partially Met      PT LONG TERM GOAL #3   Title Pt will demo ability to walk >500 ft over level/unlevel  surfaces with no LOB or increased pain    Status Achieved      PT LONG TERM GOAL #4   Title She will report able to sleep 4-5 hours without wake due to pain    Baseline Only 3 hours and then has to move because of pain    Status Not Met                 Plan - 07/25/20 1642    Clinical Impression Statement Did well with all exercises today with no c/o pain. Has met goal #1 and #3 and has partially met goal #2 and #4. Patient is discharging from PT today.    PT Treatment/Interventions ADLs/Self Care Home Management;Electrical Stimulation;Moist Heat;Neuromuscular re-education;Balance training;Therapeutic exercise;Therapeutic activities;Functional mobility training;Stair training;Gait training;Patient/family education;Manual techniques    PT Next Visit Plan Discharge           Patient will benefit from skilled therapeutic intervention in order to improve the following deficits and impairments:  Abnormal gait, Difficulty walking, Decreased endurance, Decreased safety awareness, Obesity, Decreased activity tolerance, Pain, Improper body mechanics, Decreased mobility, Decreased strength, Decreased cognition  Visit Diagnosis: Muscle weakness (generalized)  Difficulty in walking, not elsewhere classified     Problem List Patient Active Problem List   Diagnosis Date Noted  . 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 Headache 02/24/2020  . Hematuria, microscopic 02/24/2020  . Adhesive capsulitis of right shoulder 01/27/2020  . Cholelithiasis 10/30/2019  . Hyperlipidemia 05/21/2019  . Abnormal stress test 01/16/2017  . Tingling of left upper extremity 11/15/2016  . Essential hypertension 06/13/2016  . Right knee pain 02/16/2016  . Lumbar radiculopathy, chronic 01/04/2016  . Personal history of adenomatous colonic polyp 08/17/2012  . Syncope 07/24/2012  . Preventative health care 07/24/2012  . Depression 08/23/2011  . DEGENERATIVE DISC DISEASE, 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  .  NEUROPATHY 07/28/2010  . Disturbance in sleep behavior 07/28/2010  . LOW BACK PAIN SYNDROME 07/10/2007  . TOBACCO Stone 10/07/2006   PHYSICAL THERAPY DISCHARGE SUMMARY  Visits from Start of Care: 10  Plan: Patient agrees to discharge.  Patient goals were partially met. Patient is being discharged  due to being pleased with the current functional level.  ?????    Kathleen Stone, PT, DPT Lavenia Atlas, SPTA 07/25/2020, 4:46 PM  [address]. [place], [place], [address] Phone: [phone]   Fax:  [phone]  Name: DENESE MENTINK MRN: [id number] Date of Birth: February 20, 1966

## 2020-08-09 NOTE — Addendum Note (Signed)
Addended by: Hulan Fray on: 08/09/2020 04:08 PM   Modules accepted: Orders

## 2020-08-15 ENCOUNTER — Other Ambulatory Visit: Payer: Self-pay | Admitting: Internal Medicine

## 2020-08-15 DIAGNOSIS — M5416 Radiculopathy, lumbar region: Secondary | ICD-10-CM

## 2020-08-25 ENCOUNTER — Ambulatory Visit (AMBULATORY_SURGERY_CENTER): Payer: Self-pay | Admitting: *Deleted

## 2020-08-25 ENCOUNTER — Other Ambulatory Visit: Payer: Self-pay

## 2020-08-25 VITALS — Ht 66.0 in | Wt 281.0 lb

## 2020-08-25 DIAGNOSIS — Z86010 Personal history of colonic polyps: Secondary | ICD-10-CM

## 2020-08-25 NOTE — Progress Notes (Signed)
Fully vax'd ° °No egg or soy allergy known to patient  °No issues with past sedation with any surgeries or procedures °no intubation problems in the past  °No FH of Malignant Hyperthermia °No diet pills per patient °No home 02 use per patient  °No blood thinners per patient  °Pt denies issues with constipation  °No A fib or A flutter  °EMMI video to pt or via MyChart  °COVID 19 guidelines implemented in PV today with Pt and RN  ° ° °Due to the COVID-19 pandemic we are asking patients to follow these guidelines. Please only bring one care partner. Please be aware that your care partner may wait in the car in the parking lot or if they feel like they will be too hot to wait in the car, they may wait in the lobby on the 4th floor. All care partners are required to wear a mask the entire time (we do not have any that we can provide them), they need to practice social distancing, and we will do a Covid check for all patient's and care partners when you arrive. Also we will check their temperature and your temperature. If the care partner waits in their car they need to stay in the parking lot the entire time and we will call them on their cell phone when the patient is ready for discharge so they can bring the car to the front of the building. Also all patient's will need to wear a mask into building. ° °

## 2020-08-26 ENCOUNTER — Encounter: Payer: Self-pay | Admitting: Student

## 2020-08-26 ENCOUNTER — Ambulatory Visit (INDEPENDENT_AMBULATORY_CARE_PROVIDER_SITE_OTHER): Payer: BC Managed Care – PPO | Admitting: Student

## 2020-08-26 DIAGNOSIS — G4452 New daily persistent headache (NDPH): Secondary | ICD-10-CM

## 2020-08-26 DIAGNOSIS — R0602 Shortness of breath: Secondary | ICD-10-CM

## 2020-08-26 NOTE — Assessment & Plan Note (Addendum)
Patient complains of intermittent shortness of breath with exertion.  She had a echo and Myoview stress test in 2018 which was unremarkable.  Patient is smoking 1 pack a day for more than 20 years..  She often use her partners inhaler and states that it helps.  She had a CT chest done in June which were unremarkable.  Can consider doing PFTs next visit to evaluate for COPD.

## 2020-08-26 NOTE — Progress Notes (Signed)
   CC:   HPI:  [name] is a 54 y.o. with past medical history of pilocytic astrocytoma, hypertension, who presented to the ED clinic for chief complaint of headache.  Please see problem based charting for further detail  Past Medical History:  Diagnosis Date  . Brain tumor (benign) ([place])   . Chronic lower back pain    Negative MRI of hips bilaterally, neg CT scan of A and P; MRI of T and L spine-> very mild deg changes from T4-5 and T7-8, cervical spondylotic changes at C5-6, very mild disc bulge at L2-3 without spinal stenosis, nl ESR, ANA, RF, HIV, TSH, and B12  . DJD (degenerative joint disease) of cervical spine   . Family history of adverse reaction to anesthesia    "sisters PONV"  . Galactorrhea of both breasts 2009   R. sided w/ benign papilloma excised in 4/09. L sided in 5/09  . Gallstones   . Hyperlipidemia   . Hypertension   . Migraine    "monthly" (11/15/2016)  . Neuromuscular disorder ([place])    left arm and side of face   . Neuropathy    Thought to be 2/2 nerve compression in the C-spine  . Obesity   . Personal history of adenomatous colonic polyp    09/2007 - diminutive adenoma [name])  . PONV (postoperative nausea and vomiting)   . Stroke [facility])    2021- brain tumor bx caused ? stroke-   . Tobacco abuse    Review of Systems: As per HPI  Physical Exam:  Vitals:   08/26/20 0911  BP: 140/80  Pulse: 61  Temp: 98 F (36.7 C)  TempSrc: Oral  SpO2: 100%  Weight: 286 lb 4.8 oz (129.9 kg)  Height: $Remove'5\' 6"'viqGQoV$  (1.676 m)   Physical Exam Constitutional:      General: She is not in acute distress. HENT:     Head: Normocephalic.  Eyes:     General:        Right eye: No discharge.        Left eye: No discharge.     Pupils: Pupils are equal, round, and reactive to light.  Cardiovascular:     Rate and Rhythm: Normal rate and regular rhythm.     Heart sounds: Normal heart sounds.  Pulmonary:     Breath sounds: Wheezing (Mild wheezes at the upper  lung's) present.  Abdominal:     General: Bowel sounds are normal.  Musculoskeletal:     Cervical back: Normal range of motion.  Skin:    General: Skin is warm.  Neurological:     Mental Status: She is alert.     Comments: PERRLA EOM intact.  Mild nystagmus with horizontal left gaze Deficits of left posterior lateral visual field Mild facial drooping on the left with decreased sensation Strength 3/5 left UE, 4/5 left LE Strength 5/5 right UE and LE extremity No pronator drift  Psychiatric:        Mood and Affect: Mood normal.     Assessment & Plan:   See Encounters Tab for problem based charting.  Patient seen with Dr.  Jimmye Norman

## 2020-08-26 NOTE — Assessment & Plan Note (Addendum)
Patient complaining of headache that has been going on for a long time.  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  She saw her neurosurgeon at [facility] in September who did not recommend surgery.  Patient will undergo surveillance with brain MRI in January 2022.  Per chart review patient developed left-sided weakness which were thought due to intraoperative infarct.  Patient states that her headache is similar to prior headaches.  It has been happening daily, which comes and go.  Last time it happened was last night, described pain as pounding, located in the frontal, rates 7/10, last about 12 hours, Tylenol and rest provides some relief.  She denies nausea, vomiting, photophobia, eye pain, tearing, fever, neck stiffness, blurry vision new extremity weakness or loss of balance.  Patient does not notice any new weakness in her left extremities.  States that her partner did not appreciate any change in her face symmetry.  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  Patient does not report any new neurological symptoms.  Her neuro exam is remarkable for stable left sided weakness with drooping of her left face. No alarming symptom for increased intracranial pressure such as loss of balance, confusion or blurry vision.  No indication for urgent MRI at this time.  Patient is encouraged to follow-up with her neurosurgery in January at [facility] for follow-up brain MRI.  Patient is advised to not take more than 3 g of Tylenol a day for headache.

## 2020-08-26 NOTE — Patient Instructions (Signed)
Ms. Cavitt,  It is a pleasure seeing you in the clinic today.  I am sorry that you are not feeling well.  We will hold off on obtaining an urgent brain MRI due to the lack of alarming symptoms.  Please follow-up with your neurosurgeon in January.  You can take Tylenol as needed for pain.  Please do not take more than 6 tablets of the 500 mg Tylenol a day.  Please let us know if you have any questions or concerns.  Take care  Dr. Alfonse Spruce

## 2020-08-29 NOTE — Progress Notes (Signed)
Internal Medicine Clinic Attending  I saw and evaluated the patient.  I personally confirmed the key portions of the history and exam documented by Dr. Nguyen and I reviewed pertinent patient test results.  The assessment, diagnosis, and plan were formulated together and I agree with the documentation in the resident's note.\  

## 2020-09-02 ENCOUNTER — Encounter: Payer: Self-pay | Admitting: Internal Medicine

## 2020-09-14 ENCOUNTER — Ambulatory Visit (AMBULATORY_SURGERY_CENTER): Payer: BC Managed Care – PPO | Admitting: Internal Medicine

## 2020-09-14 ENCOUNTER — Encounter: Payer: Self-pay | Admitting: Internal Medicine

## 2020-09-14 ENCOUNTER — Other Ambulatory Visit: Payer: Self-pay

## 2020-09-14 ENCOUNTER — Other Ambulatory Visit: Payer: Self-pay | Admitting: Internal Medicine

## 2020-09-14 VITALS — BP 138/75 | HR 61 | Temp 98.6°F | Resp 14 | Ht 66.0 in | Wt 281.0 lb

## 2020-09-14 DIAGNOSIS — Z86010 Personal history of colonic polyps: Secondary | ICD-10-CM | POA: Diagnosis not present

## 2020-09-14 DIAGNOSIS — D125 Benign neoplasm of sigmoid colon: Secondary | ICD-10-CM | POA: Diagnosis not present

## 2020-09-14 DIAGNOSIS — D122 Benign neoplasm of ascending colon: Secondary | ICD-10-CM | POA: Diagnosis not present

## 2020-09-14 DIAGNOSIS — D124 Benign neoplasm of descending colon: Secondary | ICD-10-CM

## 2020-09-14 DIAGNOSIS — Z1211 Encounter for screening for malignant neoplasm of colon: Secondary | ICD-10-CM | POA: Diagnosis not present

## 2020-09-14 HISTORY — PX: COLONOSCOPY: SHX174

## 2020-09-14 MED ORDER — SODIUM CHLORIDE 0.9 % IV SOLN: 500.0000 mL | Freq: Once | INTRAVENOUS | Status: DC

## 2020-09-14 NOTE — Progress Notes (Signed)
To PACU, VSS. Report to [email or web address] 

## 2020-09-14 NOTE — Patient Instructions (Addendum)
I found and removed 4 tiny polyps that all look benign.  You also have a condition called diverticulosis - common and not usually a problem. Please read the handout provided.  I will let you know pathology results and when to have another routine colonoscopy by mail and/or My Chart.  I appreciate the opportunity to care for you. Iva Boop, MD, [facility]  Resume previous diet and medications.  YOU HAD AN ENDOSCOPIC PROCEDURE TODAY AT THE [facility]:   Refer to the procedure report that was given to you for any specific questions about what was found during the examination.  If the procedure report does not answer your questions, please call your gastroenterologist to clarify.  If you requested that your care partner not be given the details of your procedure findings, then the procedure report has been included in a sealed envelope for you to review at your convenience later.  YOU SHOULD EXPECT: Some feelings of bloating in the abdomen. Passage of more gas than usual.  Walking can help get rid of the air that was put into your GI tract during the procedure and reduce the bloating. If you had a lower endoscopy (such as a colonoscopy or flexible sigmoidoscopy) you may notice spotting of blood in your stool or on the toilet paper. If you underwent a bowel prep for your procedure, you may not have a normal bowel movement for a few days.  Please Note:  You might notice some irritation and congestion in your nose or some drainage.  This is from the oxygen used during your procedure.  There is no need for concern and it should clear up in a day or so.  SYMPTOMS TO REPORT IMMEDIATELY:   Following lower endoscopy (colonoscopy or flexible sigmoidoscopy):  Excessive amounts of blood in the stool  Significant tenderness or worsening of abdominal pains  Swelling of the abdomen that is new, acute  Fever of 100F or higher  For urgent or emergent issues, a gastroenterologist can be reached  at any hour by calling [phone]. Do not use MyChart messaging for urgent concerns.    DIET:  We do recommend a small meal at first, but then you may proceed to your regular diet.  Drink plenty of fluids but you should avoid alcoholic beverages for 24 hours.  ACTIVITY:  You should plan to take it easy for the rest of today and you should NOT DRIVE or use heavy machinery until tomorrow (because of the sedation medicines used during the test).    FOLLOW UP: Our staff will call the number listed on your records 48-72 hours following your procedure to check on you and address any questions or concerns that you may have regarding the information given to you following your procedure. If we do not reach you, we will leave a message.  We will attempt to reach you two times.  During this call, we will ask if you have developed any symptoms of COVID 19. If you develop any symptoms (ie: fever, flu-like symptoms, shortness of breath, cough etc.) before then, please call [phone].  If you test positive for Covid 19 in the 2 weeks post procedure, please call and report this information to [place].    If any biopsies were taken you will be contacted by phone or by letter within the next 1-3 weeks.  Please call us at [phone] if you have not heard about the biopsies in 3 weeks.    SIGNATURES/CONFIDENTIALITY: You and/or your  care partner have signed paperwork which will be entered into your electronic medical record.  These signatures attest to the fact that that the information above on your After Visit Summary has been reviewed and is understood.  Full responsibility of the confidentiality of this discharge information lies with you and/or your care-partner.

## 2020-09-14 NOTE — Progress Notes (Signed)
Called to room to assist during endoscopic procedure.  Patient ID and intended procedure confirmed with present staff. Received instructions for my participation in the procedure from the performing physician.  

## 2020-09-14 NOTE — Op Note (Signed)
[facility] Patient Name: Kathleen Stone Procedure Date: 09/14/2020 3:51 PM MRN: [id number] Endoscopist: Gatha Mayer , MD Age: 54 Referring MD:  Date of Birth: Oct 24, 1965 Gender: Female Account #: [id number] Procedure:                Colonoscopy Indications:              Surveillance: Personal history of adenomatous                            polyps on last colonoscopy > 5 years ago, Last                            colonoscopy: 2009 Medicines:                Propofol per Anesthesia, Monitored Anesthesia Care Procedure:                Pre-Anesthesia Assessment:                           - Prior to the procedure, a History and Physical                            was performed, and patient medications and                            allergies were reviewed. The patient's tolerance of                            previous anesthesia was also reviewed. The risks                            and benefits of the procedure and the sedation                            options and risks were discussed with the patient.                            All questions were answered, and informed consent                            was obtained. Prior Anticoagulants: The patient has                            taken no previous anticoagulant or antiplatelet                            agents. ASA Grade Assessment: III - A patient with                            severe systemic disease. After reviewing the risks                            and benefits, the patient was deemed in  satisfactory condition to undergo the procedure.                           After obtaining informed consent, the colonoscope                            was passed under direct vision. Throughout the                            procedure, the patient's blood pressure, pulse, and                            oxygen saturations were monitored continuously. The                            Olympus CF-HQ190L  (Serial# [id number]) Colonoscope was                            introduced through the anus and advanced to the the                            cecum, identified by appendiceal orifice and                            ileocecal valve. The colonoscopy was somewhat                            difficult due to significant looping. Successful                            completion of the procedure was aided by applying                            abdominal pressure. The patient tolerated the                            procedure well. The quality of the bowel                            preparation was good. The ileocecal valve,                            appendiceal orifice, and rectum were photographed.                            The bowel preparation used was Miralax via split                            dose instruction. Scope In: 3:59:55 PM Scope Out: 4:24:28 PM Scope Withdrawal Time: 0 hours 13 minutes 48 seconds  Total Procedure Duration: 0 hours 24 minutes 33 seconds  Findings:                 The perianal and digital rectal examinations were  normal.                           Three sessile polyps were found in the sigmoid                            colon, descending colon and ascending colon. The                            polyps were diminutive in size. These polyps were                            removed with a cold snare. Resection and retrieval                            were complete. Verification of patient                            identification for the specimen was done. Estimated                            blood loss was minimal.                           A 2 mm polyp was found in the descending colon. The                            polyp was flat. The polyp was removed with a cold                            biopsy forceps. Resection and retrieval were                            complete. Verification of patient identification                            for the  specimen was done. Estimated blood loss was                            minimal.                           Multiple diverticula were found in the sigmoid                            colon and descending colon.                           The exam was otherwise without abnormality on                            direct and retroflexion views. Complications:            No immediate complications. Estimated Blood Loss:     Estimated blood loss was minimal. Impression:               -  Three diminutive polyps in the sigmoid colon, in                            the descending colon and in the ascending colon,                            removed with a cold snare. Resected and retrieved.                           - One 2 mm polyp in the descending colon, removed                            with a cold biopsy forceps. Resected and retrieved.                           - Diverticulosis in the sigmoid colon and in the                            descending colon.                           - The examination was otherwise normal on direct                            and retroflexion views.                           - Personal history of colonic polypinutive adenoma                            2009. Recommendation:           - Patient has a contact number available for                            emergencies. The signs and symptoms of potential                            delayed complications were discussed with the                            patient. Return to normal activities tomorrow.                            Written discharge instructions were provided to the                            patient.                           - Resume previous diet.                           - Continue present medications.                           -  Repeat colonoscopy is recommended for                            surveillance. The colonoscopy date will be                            determined after pathology results from  today's                            exam become available for review. Gatha Mayer, MD 09/14/2020 4:31:41 PM This report has been signed electronically.

## 2020-09-14 NOTE — Progress Notes (Signed)
Pt's states no medical or surgical changes since previsit or office visit.   Vitals AG 

## 2020-09-19 ENCOUNTER — Telehealth: Payer: Self-pay

## 2020-09-19 ENCOUNTER — Telehealth: Payer: Self-pay | Admitting: *Deleted

## 2020-09-19 NOTE — Telephone Encounter (Signed)
  Follow up Call-  Call back number 09/14/2020  Post procedure Call Back phone  # [phone]  Permission to leave phone message Yes  Some recent data might be hidden     Patient questions:  Message left to call us if necessary.

## 2020-09-19 NOTE — Telephone Encounter (Signed)
Called [phone] and left a message we tried to reach pt for a follow up call. maw

## 2020-09-26 DIAGNOSIS — C719 Malignant neoplasm of brain, unspecified: Secondary | ICD-10-CM | POA: Diagnosis not present

## 2020-09-26 DIAGNOSIS — Z6841 Body Mass Index (BMI) 40.0 and over, adult: Secondary | ICD-10-CM | POA: Diagnosis not present

## 2020-09-26 DIAGNOSIS — R930 Abnormal findings on diagnostic imaging of skull and head, not elsewhere classified: Secondary | ICD-10-CM | POA: Diagnosis not present

## 2020-09-26 DIAGNOSIS — C717 Malignant neoplasm of brain stem: Secondary | ICD-10-CM | POA: Diagnosis not present

## 2020-09-26 DIAGNOSIS — G939 Disorder of brain, unspecified: Secondary | ICD-10-CM | POA: Diagnosis not present

## 2020-09-28 ENCOUNTER — Encounter: Payer: Self-pay | Admitting: Internal Medicine

## 2020-11-17 ENCOUNTER — Encounter: Payer: Self-pay | Admitting: Internal Medicine

## 2020-11-17 ENCOUNTER — Ambulatory Visit: Payer: BC Managed Care – PPO | Admitting: Internal Medicine

## 2020-11-17 ENCOUNTER — Other Ambulatory Visit: Payer: Self-pay

## 2020-11-17 VITALS — BP 144/78

## 2020-11-17 DIAGNOSIS — G479 Sleep disorder, unspecified: Secondary | ICD-10-CM | POA: Diagnosis not present

## 2020-11-17 DIAGNOSIS — E782 Mixed hyperlipidemia: Secondary | ICD-10-CM | POA: Diagnosis not present

## 2020-11-17 DIAGNOSIS — R0602 Shortness of breath: Secondary | ICD-10-CM | POA: Diagnosis not present

## 2020-11-17 DIAGNOSIS — I10 Essential (primary) hypertension: Secondary | ICD-10-CM

## 2020-11-17 MED ORDER — METOPROLOL TARTRATE 25 MG PO TABS
25.0000 mg | ORAL_TABLET | Freq: Two times a day (BID) | ORAL | 1 refills | Status: DC
Start: 2020-11-17 — End: 2021-07-03

## 2020-11-17 MED ORDER — ALBUTEROL SULFATE HFA 108 (90 BASE) MCG/ACT IN AERS: 1.0000 | INHALATION_SPRAY | Freq: Four times a day (QID) | RESPIRATORY_TRACT | 3 refills | Status: DC | PRN

## 2020-11-17 MED ORDER — ATORVASTATIN CALCIUM 40 MG PO TABS: 40.0000 mg | ORAL_TABLET | Freq: Every day | ORAL | 3 refills | Status: DC

## 2020-11-17 MED ORDER — OLMESARTAN-AMLODIPINE-HCTZ 40-10-12.5 MG PO TABS: 1.0000 | ORAL_TABLET | Freq: Every day | ORAL | 3 refills | Status: DC

## 2020-11-17 NOTE — Assessment & Plan Note (Signed)
Pt requires refills on medications with associated diagnosis above.  Reviewed disease process and find this medication to be necessary, will not change dose or alter current therapy. 

## 2020-11-17 NOTE — Assessment & Plan Note (Signed)
BP Readings from Last 3 Encounters:  11/17/20 (!) 144/78  09/14/20 138/75  08/26/20 (!) 148/74   Above goal. Denies any significant side effects, such as chest pain, blurry vision, headaches. Currently taking amlodipine, losartan, lopressor.  A/P Need better control. Will add-on thiazide to current regimen in combo pill  - Start olmesartan-amlodipine-hctz combo at 40-10-12.5mg  daily - C/w metoprolol xr 25mg  daily - F/u in 4 weeks

## 2020-11-17 NOTE — Progress Notes (Signed)
CC: Burning pain on left side  HPI: [name] is a 55 y.o. with PMH listed below presenting with complaint of burning left arm pain. Please see problem based assessment and plan for further details.  Past Medical History:  Diagnosis Date  . Brain tumor (benign) ([place])   . Chronic lower back pain    Negative MRI of hips bilaterally, neg CT scan of A and P; MRI of T and L spine-> very mild deg changes from T4-5 and T7-8, cervical spondylotic changes at C5-6, very mild disc bulge at L2-3 without spinal stenosis, nl ESR, ANA, RF, HIV, TSH, and B12  . DJD (degenerative joint disease) of cervical spine   . Family history of adverse reaction to anesthesia    "sisters PONV"  . Galactorrhea of both breasts 2009   R. sided w/ benign papilloma excised in 4/09. L sided in 5/09  . Gallstones   . Hyperlipidemia   . Hypertension   . Migraine    "monthly" (11/15/2016)  . Neuromuscular disorder ([place])    left arm and side of face   . Neuropathy    Thought to be 2/2 nerve compression in the C-spine  . Obesity   . Personal history of adenomatous colonic polyp    09/2007 - diminutive adenoma [name])  . PONV (postoperative nausea and vomiting)   . Stroke [facility])    2021- brain tumor bx caused ? stroke-   . Tobacco abuse     Review of Systems: Review of Systems  Constitutional: Positive for malaise/fatigue. Negative for chills and fever.  Respiratory: Positive for shortness of breath and wheezing. Negative for cough.   Cardiovascular: Negative for chest pain, palpitations and leg swelling.  Gastrointestinal: Negative for constipation, diarrhea, nausea and vomiting.  Musculoskeletal: Negative for back pain and joint pain.  Neurological: Negative for dizziness.  All other systems reviewed and are negative.    Physical Exam: Vitals:   11/17/20 0923  BP: (!) 144/78   Gen: Well-developed, well nourished, NAD HEENT: NCAT head, hearing intact CV: RRR, S1, S2 normal Pulm: CTAB, No  rales, no wheezes Extm: ROM intact, Peripheral pulses intact, No peripheral edema Skin: Dry, Warm, normal turgor  Assessment & Plan:   Essential hypertension BP Readings from Last 3 Encounters:  11/17/20 (!) 144/78  09/14/20 138/75  08/26/20 (!) 148/74   Above goal. Denies any significant side effects, such as chest pain, blurry vision, headaches. Currently taking amlodipine, losartan, lopressor.  A/P Need better control. Will add-on thiazide to current regimen in combo pill  - Start olmesartan-amlodipine-hctz combo at 40-10-12.41m daily - C/w metoprolol xr 241mdaily - F/u in 4 weeks  Shortness of breath Presents with chronic dyspneic episodes. Mentions history of episodes where she hear herself wheezing and unable to 'get her breath out.' She mentions using her wife's albuterol inhaler during these episodes with resolution of her symptoms. She denies prior diagnosis of asthma or COPD. Does currently smokes 1 pack daily.  A/P Clinically describes obstructive lung process. Chest CT in 02/2020 without significant findings. Need PFT to evaluate for COPD vs Asthma but she mentions work-duties as concern for time commitment for further testing. She states she will consider. Meanwhile will provide rescue inhaler for symptom management. - Need PFT when patient agreeable - Albuterol inhaler 1-2 puffs PRN  Disturbance in sleep behavior Presents with complaint of fatigue and low energy as well as daytime sleepiness. She mentions that she finds herself feeling sleepy despite having full night of  sleep. Mentions that her wife states she can hear her 'snoring' across the house. She was tested for sleep study in 2008 which did not show obstructive sleep apnea. She has never used CPAP.  A/P Present with fatigue, somnolence. Uncontrolled hypertension and BMI suggest OSA as possible cause. STOP-BANG score of 5. Prior sleep study was 14 years ago and she was also 70 pounds lighter. Will repeat sleep  study. Meanwhile, discussed importance of good sleep hygiene, including avoiding cigarettes prior to bed. - Sleep hygiene - Sleep study ordered  Hyperlipidemia Pt requires refills on medications with associated diagnosis above.  Reviewed disease process and find this medication to be necessary, will not change dose or alter current therapy.    Patient discussed with Dr. Dareen Piano  -Gilberto Better, [place] [facility] Pager: [phone]

## 2020-11-17 NOTE — Assessment & Plan Note (Signed)
Presents with complaint of fatigue and low energy as well as daytime sleepiness. She mentions that she finds herself feeling sleepy despite having full night of sleep. Mentions that her wife states she can hear her 'snoring' across the house. She was tested for sleep study in 2008 which did not show obstructive sleep apnea. She has never used CPAP.  A/P Present with fatigue, somnolence. Uncontrolled hypertension and BMI suggest OSA as possible cause. STOP-BANG score of 5. Prior sleep study was 14 years ago and she was also 70 pounds lighter. Will repeat sleep study. Meanwhile, discussed importance of good sleep hygiene, including avoiding cigarettes prior to bed. - Sleep hygiene - Sleep study ordered

## 2020-11-17 NOTE — Assessment & Plan Note (Signed)
Presents with chronic dyspneic episodes. Mentions history of episodes where she hear herself wheezing and unable to 'get her breath out.' She mentions using her wife's albuterol inhaler during these episodes with resolution of her symptoms. She denies prior diagnosis of asthma or COPD. Does currently smokes 1 pack daily.  A/P Clinically describes obstructive lung process. Chest CT in 02/2020 without significant findings. Need PFT to evaluate for COPD vs Asthma but she mentions work-duties as concern for time commitment for further testing. She states she will consider. Meanwhile will provide rescue inhaler for symptom management. - Need PFT when patient agreeable - Albuterol inhaler 1-2 puffs PRN

## 2020-11-17 NOTE — Patient Instructions (Signed)
Thank you for allowing [place] to provide your care today. Today we discussed your blood pressure    I have ordered no labs for you. I will call if any are abnormal.    Today we made the following changes to your medications.    Please START olmesartan-amlodipine-hctz 40-10-12.5mg  daily Please START albuterol inhaler only as needed 1-2 puffs You can increase your dose of gabapentin up to 900mg   Please STOP losartan, amlodipine.  Please follow-up in 4 weeks.    Should you have any questions or concerns please call the internal medicine clinic at [phone].     Neuropathic Pain Neuropathic pain is pain caused by damage to the nerves that are responsible for certain sensations in your body (sensory nerves). The pain can be caused by:  Damage to the sensory nerves that send signals to your spinal cord and brain (peripheral nervous system).  Damage to the sensory nerves in your brain or spinal cord (central nervous system). Neuropathic pain can make you more sensitive to pain. Even a minor sensation can feel very painful. This is usually a long-term condition that can be difficult to treat. The type of pain differs from person to person. It may:  Start suddenly (acute), or it may develop slowly and last for a long time (chronic).  Come and go as damaged nerves heal, or it may stay at the same level for years.  Cause emotional distress, loss of sleep, and a lower quality of life. What are the causes? The most common cause of this condition is diabetes. Many other diseases and conditions can also cause neuropathic pain. Causes of neuropathic pain can be classified as:  Toxic. This is caused by medicines and chemicals. The most common cause of toxic neuropathic pain is damage from cancer treatments (chemotherapy).  Metabolic. This can be caused by: ? Diabetes. This is the most common disease that damages the nerves. ? Lack of vitamin B from long-term alcohol abuse.  Traumatic. Any injury  that cuts, crushes, or stretches a nerve can cause damage and pain. A common example is feeling pain after losing an arm or leg (phantom limb pain).  Compression-related. If a sensory nerve gets trapped or compressed for a long period of time, the blood supply to the nerve can be cut off.  Vascular. Many blood vessel diseases can cause neuropathic pain by decreasing blood supply and oxygen to nerves.  Autoimmune. This type of pain results from diseases in which the body's defense system (immune system) mistakenly attacks sensory nerves. Examples of autoimmune diseases that can cause neuropathic pain include lupus and multiple sclerosis.  Infectious. Many types of viral infections can damage sensory nerves and cause pain. Shingles infection is a common cause of this type of pain.  Inherited. Neuropathic pain can be a symptom of many diseases that are passed down through families (genetic). What increases the risk? You are more likely to develop this condition if:  You have diabetes.  You smoke.  You drink too much alcohol.  You are taking certain medicines, including medicines that kill cancer cells (chemotherapy) or that treat immune system disorders. What are the signs or symptoms? The main symptom is pain. Neuropathic pain is often described as:  Burning.  Shock-like.  Stinging.  Hot or cold.  Itching. How is this diagnosed? No single test can diagnose neuropathic pain. It is diagnosed based on:  Physical exam and your symptoms. Your health care provider will ask you about your pain. You may be asked  to use a pain scale to describe how bad your pain is.  Tests. These may be done to see if you have a high sensitivity to pain and to help find the cause and location of any sensory nerve damage. They include: ? Nerve conduction studies to test how well nerve signals travel through your sensory nerves (electrodiagnostic testing). ? Stimulating your sensory nerves through  electrodes on your skin and measuring the response in your spinal cord and brain (somatosensory evoked potential).  Imaging studies, such as: ? X-rays. ? CT scan. ? MRI. How is this treated? Treatment for neuropathic pain may change over time. You may need to try different treatment options or a combination of treatments. Some options include:  Treating the underlying cause of the neuropathy, such as diabetes, kidney disease, or vitamin deficiencies.  Stopping medicines that can cause neuropathy, such as chemotherapy.  Medicine to relieve pain. Medicines may include: ? Prescription or over-the-counter pain medicine. ? Anti-seizure medicine. ? Antidepressant medicines. ? Pain-relieving patches that are applied to painful areas of skin. ? A medicine to numb the area (local anesthetic), which can be injected as a nerve block.  Transcutaneous nerve stimulation. This uses electrical currents to block painful nerve signals. The treatment is painless.  Alternative treatments, such as: ? Acupuncture. ? Meditation. ? Massage. ? Physical therapy. ? Pain management programs. ? Counseling. Follow these instructions at home: Medicines  Take over-the-counter and prescription medicines only as told by your health care provider.  Do not drive or use heavy machinery while taking prescription pain medicine.  If you are taking prescription pain medicine, take actions to prevent or treat constipation. Your health care provider may recommend that you: ? Drink enough fluid to keep your urine pale yellow. ? Eat foods that are high in fiber, such as fresh fruits and vegetables, whole grains, and beans. ? Limit foods that are high in fat and processed sugars, such as fried or sweet foods. ? Take an over-the-counter or prescription medicine for constipation.   Lifestyle  Have a good support system at home.  Consider joining a chronic pain support group.  Do not use any products that contain  nicotine or tobacco, such as cigarettes and e-cigarettes. If you need help quitting, ask your health care provider.  Do not drink alcohol.   General instructions  Learn as much as you can about your condition.  Work closely with all your health care providers to find the treatment plan that works best for you.  Ask your health care provider what activities are safe for you.  Keep all follow-up visits as told by your health care provider. This is important. Contact a health care provider if:  Your pain treatments are not working.  You are having side effects from your medicines.  You are struggling with tiredness (fatigue), mood changes, depression, or anxiety. Summary  Neuropathic pain is pain caused by damage to the nerves that are responsible for certain sensations in your body (sensory nerves).  Neuropathic pain may come and go as damaged nerves heal, or it may stay at the same level for years.  Neuropathic pain is usually a long-term condition that can be difficult to treat. Consider joining a chronic pain support group. This information is not intended to replace advice given to you by your health care provider. Make sure you discuss any questions you have with your health care provider. Document Revised: 12/25/2018 Document Reviewed: 09/20/2017 Elsevier Patient Education  2021 [employer].

## 2020-11-18 NOTE — Progress Notes (Signed)
Internal Medicine Clinic Attending  Case discussed with Dr. Lee  At the time of the visit.  We reviewed the resident's history and exam and pertinent patient test results.  I agree with the assessment, diagnosis, and plan of care documented in the resident's note.    

## 2021-02-11 ENCOUNTER — Encounter: Payer: Self-pay | Admitting: *Deleted

## 2021-03-21 ENCOUNTER — Encounter: Payer: Self-pay | Admitting: *Deleted

## 2021-04-04 NOTE — Telephone Encounter (Signed)
A user error has taken place: encounter opened in error, closed for administrative reasons.

## 2021-04-25 ENCOUNTER — Encounter: Payer: Self-pay | Admitting: Internal Medicine

## 2021-04-25 ENCOUNTER — Ambulatory Visit: Payer: BC Managed Care – PPO | Admitting: Internal Medicine

## 2021-04-25 VITALS — BP 168/81 | HR 55 | Temp 98.0°F | Wt 277.3 lb

## 2021-04-25 DIAGNOSIS — N950 Postmenopausal bleeding: Secondary | ICD-10-CM

## 2021-04-25 DIAGNOSIS — M792 Neuralgia and neuritis, unspecified: Secondary | ICD-10-CM

## 2021-04-25 DIAGNOSIS — C719 Malignant neoplasm of brain, unspecified: Secondary | ICD-10-CM

## 2021-04-25 MED ORDER — DULOXETINE HCL 30 MG PO CPEP: 30.0000 mg | ORAL_CAPSULE | Freq: Every day | ORAL | 2 refills | Status: DC

## 2021-04-25 NOTE — Progress Notes (Signed)
   CC: post menopausal bleeding, pilocytic astrocytoma  HPI:[name] is a 55 y.o. female who presents for evaluation of post menopausal bleeding and pilocystic astrocytoma with neuropathic pain. Please see individual problem based A/P for details.   Past Medical History:  Diagnosis Date   Brain tumor (benign) ([facility])    Chronic lower back pain    Negative MRI of hips bilaterally, neg CT scan of A and P; MRI of T and L spine-> very mild deg changes from T4-5 and T7-8, cervical spondylotic changes at C5-6, very mild disc bulge at L2-3 without spinal stenosis, nl ESR, ANA, RF, HIV, TSH, and B12   DJD (degenerative joint disease) of cervical spine    Family history of adverse reaction to anesthesia    "sisters PONV"   Galactorrhea of both breasts 2009   R. sided w/ benign papilloma excised in 4/09. L sided in 5/09   Gallstones    Hyperlipidemia    Hypertension    Migraine    "monthly" (11/15/2016)   Neuromuscular disorder ([place])    left arm and side of face    Neuropathy    Thought to be 2/2 nerve compression in the C-spine   Obesity    Personal history of adenomatous colonic polyp    09/2007 - diminutive adenoma ([facility])   PONV (postoperative nausea and vomiting)    Stroke ([place])    2021- brain tumor bx caused ? stroke-    Tobacco abuse    Review of Systems:   Review of Systems  Constitutional:  Negative for chills and fever.  Neurological:  Positive for tingling. Negative for weakness.    Physical Exam: Vitals:   04/25/21 1108  BP: (!) 168/81  Pulse: (!) 55  Temp: 98 F (36.7 C)  TempSrc: Oral  SpO2: 100%  Weight: 277 lb 4.8 oz (125.8 kg)   General: NAD, nl appearance HEENT: Conjunctiva nl , antiicteric sclerae Cardiovascular: Normal rate, regular rhythm.  No murmurs, rubs, or gallops Pulmonary : Equal breath sounds, No wheezes, rales, or rhonchi Abdominal: soft, nontender,  bowel sounds present Ext: No edema in lower extremities, no tenderness to palpation of  lower extremities.  Neuro: AO x 4 , CN II-XII, motor 5/5, sensory in UE's and LE's nl  Assessment & Plan:   See Encounters Tab for problem based charting.  Patient discussed with Dr. Philipp Ovens

## 2021-04-25 NOTE — Patient Instructions (Addendum)
Thank you for trusting me with your care. To recap, today we discussed the following:   1. Post-menopausal bleeding  - Ambulatory referral to Gynecology  2. 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 Neuropathic pain  - DULoxetine (CYMBALTA) 30 MG capsule; Take 1 capsule (30 mg total) by mouth daily.  Dispense: 30 capsule; Refill: 2

## 2021-04-28 ENCOUNTER — Encounter: Payer: Self-pay | Admitting: Internal Medicine

## 2021-04-28 DIAGNOSIS — N950 Postmenopausal bleeding: Secondary | ICD-10-CM | POA: Insufficient documentation

## 2021-04-28 NOTE — Assessment & Plan Note (Addendum)
History as described in overview of this problem. Patient continues to have face and upper extremity numbness. This numbness has intensified recently and painful. She is prescribed Gabapentin which helps, but she can not take during the day because it makes her sleepy. She missed her follow-up appointment with [facility] neurosurgery for 6 month check up of Pilocytic astrocytoma. Review of problem suggest this neuropathic pain may not be related. Patient describes pain starting with finding out she had brain mass. Patient will need to be seen by her neurosurgeon and due for her recommended imaging. Patient will call and schedule with [facility]. Will prescribe duloxetine for neuropathic pain.

## 2021-04-28 NOTE — Assessment & Plan Note (Signed)
Patient reports she has not had a menstrual cycle in over 5 years.  This past week she had what is described as a normal period with the menstrual flow decreasing over 5 days. Her last pap smear was normal in 2012. She would prefer a female physician for pelvic exam.   Assessment/plan: Post-menopausal bleeding. Patient deferred pelvic exam today, will send to Ob/Gyn for further workup.  - Ambulatory referral to Gynecology

## 2021-05-02 NOTE — Progress Notes (Signed)
Internal Medicine Clinic Attending  Case discussed with Dr. Steen  At the time of the visit.  We reviewed the resident's history and exam and pertinent patient test results.  I agree with the assessment, diagnosis, and plan of care documented in the resident's note.  

## 2021-05-25 ENCOUNTER — Other Ambulatory Visit: Payer: Self-pay | Admitting: Student

## 2021-05-25 ENCOUNTER — Telehealth: Payer: Self-pay | Admitting: *Deleted

## 2021-05-25 DIAGNOSIS — F419 Anxiety disorder, unspecified: Secondary | ICD-10-CM

## 2021-05-25 MED ORDER — HYDROXYZINE HCL 50 MG PO TABS: 50.0000 mg | ORAL_TABLET | Freq: Three times a day (TID) | ORAL | 0 refills | Status: AC | PRN

## 2021-05-25 NOTE — Telephone Encounter (Signed)
Call from pt stating she is changing to [place]. CMA changed pharmacy in chart.   Pt will be flying to [place] soon and is very anxious and nervous Requests something for nerves for the flight.  Will send to appropriate team for review.Despina Hidden Cassady9/8/20228:44 AM

## 2021-06-12 ENCOUNTER — Ambulatory Visit: Payer: BC Managed Care – PPO | Admitting: Family Medicine

## 2021-06-29 ENCOUNTER — Other Ambulatory Visit: Payer: Self-pay | Admitting: Internal Medicine

## 2021-06-29 DIAGNOSIS — I10 Essential (primary) hypertension: Secondary | ICD-10-CM

## 2021-09-28 ENCOUNTER — Other Ambulatory Visit: Payer: Self-pay

## 2021-09-28 DIAGNOSIS — M5416 Radiculopathy, lumbar region: Secondary | ICD-10-CM

## 2021-09-28 MED ORDER — GABAPENTIN 300 MG PO CAPS: 300.0000 mg | ORAL_CAPSULE | Freq: Two times a day (BID) | ORAL | 2 refills | Status: DC

## 2021-11-01 ENCOUNTER — Encounter: Payer: Self-pay | Admitting: Internal Medicine

## 2021-11-01 ENCOUNTER — Ambulatory Visit: Payer: 59 | Admitting: Internal Medicine

## 2021-11-01 VITALS — BP 145/98 | HR 67 | Temp 97.8°F | Ht 66.0 in | Wt 254.4 lb

## 2021-11-01 DIAGNOSIS — R0602 Shortness of breath: Secondary | ICD-10-CM

## 2021-11-01 DIAGNOSIS — M5416 Radiculopathy, lumbar region: Secondary | ICD-10-CM

## 2021-11-01 DIAGNOSIS — E782 Mixed hyperlipidemia: Secondary | ICD-10-CM

## 2021-11-01 DIAGNOSIS — I10 Essential (primary) hypertension: Secondary | ICD-10-CM | POA: Diagnosis not present

## 2021-11-01 DIAGNOSIS — F411 Generalized anxiety disorder: Secondary | ICD-10-CM

## 2021-11-01 DIAGNOSIS — C719 Malignant neoplasm of brain, unspecified: Secondary | ICD-10-CM

## 2021-11-01 DIAGNOSIS — F322 Major depressive disorder, single episode, severe without psychotic features: Secondary | ICD-10-CM

## 2021-11-01 MED ORDER — NITROGLYCERIN 0.4 MG SL SUBL: 0.4000 mg | SUBLINGUAL_TABLET | SUBLINGUAL | 1 refills | Status: AC | PRN

## 2021-11-01 MED ORDER — GABAPENTIN 300 MG PO CAPS: 300.0000 mg | ORAL_CAPSULE | Freq: Two times a day (BID) | ORAL | 2 refills | Status: DC

## 2021-11-01 MED ORDER — ALBUTEROL SULFATE HFA 108 (90 BASE) MCG/ACT IN AERS: 1.0000 | INHALATION_SPRAY | Freq: Four times a day (QID) | RESPIRATORY_TRACT | 3 refills | Status: DC | PRN

## 2021-11-01 MED ORDER — OLMESARTAN-AMLODIPINE-HCTZ 40-10-12.5 MG PO TABS: 1.0000 | ORAL_TABLET | Freq: Every day | ORAL | 3 refills | Status: DC

## 2021-11-01 MED ORDER — ATORVASTATIN CALCIUM 40 MG PO TABS: 40.0000 mg | ORAL_TABLET | Freq: Every day | ORAL | 3 refills | Status: DC

## 2021-11-01 MED ORDER — METOPROLOL TARTRATE 25 MG PO TABS: 25.0000 mg | ORAL_TABLET | Freq: Two times a day (BID) | ORAL | 1 refills | Status: DC

## 2021-11-01 MED ORDER — CAPSAICIN 0.025 % EX LOTN: 1.0000 g | TOPICAL_LOTION | Freq: Two times a day (BID) | CUTANEOUS | 0 refills | Status: DC | PRN

## 2021-11-01 MED ORDER — DULOXETINE HCL 60 MG PO CPEP: 60.0000 mg | ORAL_CAPSULE | Freq: Every day | ORAL | 2 refills | Status: DC

## 2021-11-01 NOTE — Progress Notes (Signed)
° °  CC: numbness/burning on left side of face and arm  HPI:  [name] is a 56 y.o. female with HTN, DDD of the cervicals pine, and pilocytic astrocytoma who presents to the [facility] for numbness and burning on the left side of her face and arm (has been ongoing since she was diagnosed with pilocytic astrocytoma). Please see problem-based list for further details, assessments, and plans.   Past Medical History:  Diagnosis Date   Brain tumor (benign) (HCC)    Chronic lower back pain    Negative MRI of hips bilaterally, neg CT scan of A and P; MRI of T and L spine-> very mild deg changes from T4-5 and T7-8, cervical spondylotic changes at C5-6, very mild disc bulge at L2-3 without spinal stenosis, nl ESR, ANA, RF, HIV, TSH, and B12   DJD (degenerative joint disease) of cervical spine    Family history of adverse reaction to anesthesia    "sisters PONV"   Galactorrhea of both breasts 2009   R. sided w/ benign papilloma excised in 4/09. L sided in 5/09   Gallstones    Hyperlipidemia    Hypertension    Migraine    "monthly" (11/15/2016)   Neuromuscular disorder ([place])    left arm and side of face    Neuropathy    Thought to be 2/2 nerve compression in the C-spine   Obesity    Personal history of adenomatous colonic polyp    09/2007 - diminutive adenoma ([facility])   PONV (postoperative nausea and vomiting)    Stroke ([place])    2021- brain tumor bx caused ? stroke-    Tobacco abuse    Review of Systems:  Review of Systems  Constitutional:  Negative for chills and fever.  HENT:  Negative for hearing loss and tinnitus.   Eyes:  Negative for blurred vision and double vision.  Respiratory:  Negative for cough and shortness of breath.   Cardiovascular:  Negative for chest pain and palpitations.  Gastrointestinal:  Negative for diarrhea, nausea and vomiting.  Genitourinary: Negative.   Musculoskeletal:  Positive for myalgias.  Neurological:  Positive for tingling and headaches. Negative  for dizziness, seizures and weakness.  Psychiatric/Behavioral:  Positive for depression. The patient is nervous/anxious.     Physical Exam:  Vitals:   11/01/21 0850  BP: (!) 145/98  Pulse: 67  Temp: 97.8 F (36.6 C)  TempSrc: Oral  SpO2: 95%  Weight: 254 lb 6.4 oz (115.4 kg)  Height: $Remove'5\' 6"'wRdzCsI$  (1.676 m)   General: Appears stated age. No acute distress. CV: RRR. No murmurs Pulmonary: Lungs CTAB. Normal effort.  Abdominal: Soft, nontender, nondistended.  Skin: Warm and dry. No obvious rash or lesions. Neuro: A&Ox4. Moves all extremities. 5/5 strength bilateral upper and lower extremities. Normal sensation. CN II-XII intact. No focal deficit. Psych: Depressed mood and affect   Assessment & Plan:   See Encounters Tab for problem based charting.  Patient discussed with Dr.  Saverio Danker

## 2021-11-01 NOTE — Assessment & Plan Note (Addendum)
Patient diagnosed with pilocytic astrocytoma via biopsy in July of 2021 and has been having numbness/burning on the left side of her face and arm since then. She takes gabapentin 300 mg bid (although only takes it twice daily during the weekends and during the week, she takes it qhs) and duloxetine 30 mg qhs. These medications give her relief, although they make her sleepy, so she has been taking them at night. Will increase duloxetine to 60 mg today and advised patient to take it during the day on the weekends when she is not working, to see if this gives her more relief. Can also try capsacin cream on her arm, but not her face, to see if this improves her symptoms. The patient missed her follow up appt with [facility] neurosurgery for her follow up of the pilocytic astrocytoma and also has not had a recent MRI, although she is recommended to get this annually. Patient also notes that she has been dealing with a lot of irritability and anger, and she is unsure if this is related to the brain mass. Strongly encouraged patient to get MRI and to follow up with [facility] neurosurgery regarding this.   Plan: - Follow up with [facility] neurosurgery - Duloxetine increased to 60 mg daily - Continue gabapentin 300 mg bid; consider Lyrica if patient still does not have much reilef - Capsacin cream prn

## 2021-11-01 NOTE — Assessment & Plan Note (Signed)
BP Readings from Last 3 Encounters:  11/01/21 (!) 145/98  04/25/21 (!) 168/81  11/17/20 (!) 144/78   BP remains above goal. Denies any headache, blurry vision, LOC, or chest pain. She is currently on olmesartan-amlodipine-hctz 40-10-12.5, although she does not take this everyday. She is also on metoprolol 25 mg bid and reports that she has been taking this consistently.  Plan: - Refilled olmesartan-amlodipine-hctz and metoprolol - Strongly encouraged medication adherence

## 2021-11-01 NOTE — Assessment & Plan Note (Signed)
Patient has been endorsing an increase in her anxiety and depression recently. She also notes that over the past month, she has been irritable and more angry than usual, although she is unsure why. The patient has been keeping to herself more and not spending time with the people she loves as much. She is concerned that this could be from her pilocytic astrocytoma, although she has not had a recent MRI to evaluate if the lesion is growing. Discussed that although her symptoms could be from this, she could also have primary GAD and major depression. Also discussed referral to integrated behavioral health (Dr. Theodis Shove) to which the patient is agreeable.  Plan: - Referral to integrated behavioral health placed - Recommend Neurosurgery follow up

## 2021-11-01 NOTE — Patient Instructions (Signed)
Thank you, Kathleen Stone for allowing [place] to provide your care today. 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 I strongly recommend you go back to see your neurosurgeon and get the MRI of your brain, to see if the tumor has grown. Additionally, I will increase your Cymbalta to 60 mg daily, which should help your symptoms. You can try to take this during the day on the weekends, to see if this helps your symptoms more and also to see if it makes you sleepy. Continue to use your gabapentin as well. I also prescribed capsacin cream, which you can apply to your arm to see if that helps your symptoms also- but do not apply this to your face.   Anxiety/depression: As we discussed, it is hard to tell if your mood symptoms are related to the brain tumor or not. I have referred you to our clinical psychologist, Dr. Theodis Shove  Med refills: Refilled blood pressure medications and cholesterol medication- please be sure to take these everyday  Muscle aches: Use a heating pad/hot towel when your leg muscles ache and be sure to get plenty of exercise  I have ordered the following labs for you:  Lab Orders  No laboratory test(s) ordered today     Tests ordered today:  none  Referrals ordered today:    Referral Orders         Ambulatory referral to [facility]      I have ordered the following medication/changed the following medications:   Stop the following medications: Medications Discontinued During This Encounter  Medication Reason   nitroGLYCERIN (NITROSTAT) 0.4 MG SL tablet Reorder   albuterol (PROVENTIL HFA) 108 (90 Base) MCG/ACT inhaler Reorder   Olmesartan-amLODIPine-HCTZ 40-10-12.5 MG TABS Reorder   atorvastatin (LIPITOR) 40 MG tablet Reorder   DULoxetine (CYMBALTA) 30 MG capsule Reorder   metoprolol tartrate (LOPRESSOR) 25 MG tablet Reorder   gabapentin (NEURONTIN) 300 MG capsule Reorder     Start  the following medications: Meds ordered this encounter  Medications   Olmesartan-amLODIPine-HCTZ 40-10-12.5 MG TABS    Sig: Take 1 tablet by mouth daily.    Dispense:  30 tablet    Refill:  3   nitroGLYCERIN (NITROSTAT) 0.4 MG SL tablet    Sig: Place 1 tablet (0.4 mg total) under the tongue every 5 (five) minutes as needed for chest pain.    Dispense:  25 tablet    Refill:  1   metoprolol tartrate (LOPRESSOR) 25 MG tablet    Sig: Take 1 tablet (25 mg total) by mouth 2 (two) times daily.    Dispense:  180 tablet    Refill:  1   gabapentin (NEURONTIN) 300 MG capsule    Sig: Take 1 capsule (300 mg total) by mouth 2 (two) times daily.    Dispense:  180 capsule    Refill:  2   DULoxetine (CYMBALTA) 60 MG capsule    Sig: Take 1 capsule (60 mg total) by mouth daily.    Dispense:  90 capsule    Refill:  2   atorvastatin (LIPITOR) 40 MG tablet    Sig: Take 1 tablet (40 mg total) by mouth daily.    Dispense:  90 tablet    Refill:  3   albuterol (PROVENTIL HFA) 108 (90 Base) MCG/ACT inhaler    Sig: Inhale 1-2 puffs into the lungs every 6 (six) hours as needed for wheezing or shortness of breath.  Dispense:  8 g    Refill:  3   Capsaicin 0.025 % LOTN    Sig: Apply 1 g topically 2 (two) times daily as needed. Do not apply to face    Dispense:  113.2 mL    Refill:  0     Follow up: 3-4 months    Remember: To follow up with your neurosurgeon   Should you have any questions or concerns please call the internal medicine clinic at [phone].     Buddy Duty, D.O. [facility]

## 2021-11-06 NOTE — Progress Notes (Signed)
Internal Medicine Clinic Attending ° °Case discussed with Dr. Atway  At the time of the visit.  We reviewed the resident’s history and exam and pertinent patient test results.  I agree with the assessment, diagnosis, and plan of care documented in the resident’s note.  °

## 2021-11-09 ENCOUNTER — Encounter: Payer: Self-pay | Admitting: Internal Medicine

## 2021-11-15 ENCOUNTER — Ambulatory Visit (HOSPITAL_COMMUNITY)
Admission: EM | Admit: 2021-11-15 | Discharge: 2021-11-16 | Disposition: A | Discharge status: Home / Self Care | Payer: 59 | Attending: Student | Admitting: Student

## 2021-11-15 DIAGNOSIS — F17210 Nicotine dependence, cigarettes, uncomplicated: Secondary | ICD-10-CM | POA: Insufficient documentation

## 2021-11-15 DIAGNOSIS — F331 Major depressive disorder, recurrent, moderate: Secondary | ICD-10-CM | POA: Diagnosis not present

## 2021-11-15 DIAGNOSIS — Z20822 Contact with and (suspected) exposure to covid-19: Secondary | ICD-10-CM | POA: Insufficient documentation

## 2021-11-15 DIAGNOSIS — I10 Essential (primary) hypertension: Secondary | ICD-10-CM | POA: Insufficient documentation

## 2021-11-15 DIAGNOSIS — E785 Hyperlipidemia, unspecified: Secondary | ICD-10-CM | POA: Insufficient documentation

## 2021-11-15 DIAGNOSIS — Z046 Encounter for general psychiatric examination, requested by authority: Secondary | ICD-10-CM

## 2021-11-15 DIAGNOSIS — Z79899 Other long term (current) drug therapy: Secondary | ICD-10-CM | POA: Insufficient documentation

## 2021-11-15 MED ORDER — ALUM & MAG HYDROXIDE-SIMETH 200-200-20 MG/5ML PO SUSP: 30.0000 mL | ORAL | Status: DC | PRN

## 2021-11-15 MED ORDER — ACETAMINOPHEN 325 MG PO TABS: 650.0000 mg | ORAL_TABLET | Freq: Four times a day (QID) | ORAL | Status: DC | PRN

## 2021-11-15 MED ORDER — MAGNESIUM HYDROXIDE 400 MG/5ML PO SUSP: 30.0000 mL | Freq: Every day | ORAL | Status: DC | PRN
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
## 2021-11-16 DIAGNOSIS — I10 Essential (primary) hypertension: Secondary | ICD-10-CM | POA: Diagnosis not present

## 2021-11-16 DIAGNOSIS — Z046 Encounter for general psychiatric examination, requested by authority: Secondary | ICD-10-CM

## 2021-11-16 DIAGNOSIS — E785 Hyperlipidemia, unspecified: Secondary | ICD-10-CM | POA: Diagnosis not present

## 2021-11-16 DIAGNOSIS — Z20822 Contact with and (suspected) exposure to covid-19: Secondary | ICD-10-CM | POA: Diagnosis not present

## 2021-11-16 DIAGNOSIS — F331 Major depressive disorder, recurrent, moderate: Secondary | ICD-10-CM | POA: Diagnosis not present

## 2021-11-16 DIAGNOSIS — Z79899 Other long term (current) drug therapy: Secondary | ICD-10-CM | POA: Diagnosis not present

## 2021-11-16 DIAGNOSIS — F17210 Nicotine dependence, cigarettes, uncomplicated: Secondary | ICD-10-CM | POA: Diagnosis not present

## 2021-11-16 LAB — CBC WITH DIFFERENTIAL/PLATELET
Abs Immature Granulocytes: 0.09 10*3/uL — ABNORMAL HIGH (ref 0.00–0.07)
Basophils Absolute: 0.1 10*3/uL (ref 0.0–0.1)
Basophils Relative: 0 %
Eosinophils Absolute: 0.1 10*3/uL (ref 0.0–0.5)
Eosinophils Relative: 1 %
HCT: 47.9 % — ABNORMAL HIGH (ref 36.0–46.0)
Hemoglobin: 15.6 g/dL — ABNORMAL HIGH (ref 12.0–15.0)
Immature Granulocytes: 1 %
Lymphocytes Relative: 27 %
Lymphs Abs: 4.4 10*3/uL — ABNORMAL HIGH (ref 0.7–4.0)
MCH: 29.2 pg (ref 26.0–34.0)
MCHC: 32.6 g/dL (ref 30.0–36.0)
MCV: 89.5 fL (ref 80.0–100.0)
Monocytes Absolute: 0.9 10*3/uL (ref 0.1–1.0)
Monocytes Relative: 6 %
Neutro Abs: 11 10*3/uL — ABNORMAL HIGH (ref 1.7–7.7)
Neutrophils Relative %: 65 %
Platelets: 192 10*3/uL (ref 150–400)
RBC: 5.35 MIL/uL — ABNORMAL HIGH (ref 3.87–5.11)
RDW: 14.6 % (ref 11.5–15.5)
WBC: 16.6 10*3/uL — ABNORMAL HIGH (ref 4.0–10.5)
nRBC: 0 % (ref 0.0–0.2)

## 2021-11-16 LAB — COMPREHENSIVE METABOLIC PANEL
ALT: 21 U/L (ref 0–44)
AST: 25 U/L (ref 15–41)
Albumin: 4.3 g/dL (ref 3.5–5.0)
Alkaline Phosphatase: 96 U/L (ref 38–126)
Anion gap: 12 (ref 5–15)
BUN: 13 mg/dL (ref 6–20)
CO2: 25 mmol/L (ref 22–32)
Calcium: 10.3 mg/dL (ref 8.9–10.3)
Chloride: 104 mmol/L (ref 98–111)
Creatinine, Ser: 1.06 mg/dL — ABNORMAL HIGH (ref 0.44–1.00)
GFR, Estimated: >60 mL/min (ref >60)
Glucose, Bld: 87 mg/dL (ref 70–99)
Potassium: 4 mmol/L (ref 3.5–5.1)
Sodium: 141 mmol/L (ref 135–145)
Total Bilirubin: 1 mg/dL (ref 0.3–1.2)
Total Protein: 7.7 g/dL (ref 6.5–8.1)

## 2021-11-16 LAB — POCT URINE DRUG SCREEN - MANUAL ENTRY (I-SCREEN)
POC Amphetamine UR: NOT DETECTED
POC Buprenorphine (BUP): NOT DETECTED
POC Cocaine UR: NOT DETECTED
POC Marijuana UR: NOT DETECTED
POC Methadone UR: NOT DETECTED
POC Methamphetamine UR: NOT DETECTED
POC Morphine: NOT DETECTED
POC Oxazepam (BZO): NOT DETECTED
POC Oxycodone UR: NOT DETECTED
POC Secobarbital (BAR): NOT DETECTED

## 2021-11-16 LAB — HEMOGLOBIN A1C
Hgb A1c MFr Bld: 5.8 % — ABNORMAL HIGH (ref 4.8–5.6)
Mean Plasma Glucose: 119.76 mg/dL

## 2021-11-16 LAB — LIPID PANEL
Cholesterol: 174 mg/dL (ref 0–200)
HDL: 38 mg/dL — ABNORMAL LOW (ref >40)
LDL Cholesterol: 106 mg/dL — ABNORMAL HIGH (ref 0–99)
Total CHOL/HDL Ratio: 4.6 RATIO
Triglycerides: 149 mg/dL (ref <150)
VLDL: 30 mg/dL (ref 0–40)

## 2021-11-16 LAB — TSH: TSH: 2.164 u[IU]/mL (ref 0.350–4.500)

## 2021-11-16 LAB — POC SARS CORONAVIRUS 2 AG: SARSCOV2ONAVIRUS 2 AG: NEGATIVE

## 2021-11-16 LAB — ETHANOL: Alcohol, Ethyl (B): <10 mg/dL (ref <10)

## 2021-11-16 LAB — POC SARS CORONAVIRUS 2 AG -  ED: SARS Coronavirus 2 Ag: NEGATIVE

## 2021-11-16 LAB — RESP PANEL BY RT-PCR (FLU A&B, COVID) ARPGX2
Influenza A by PCR: NEGATIVE
Influenza B by PCR: NEGATIVE
SARS Coronavirus 2 by RT PCR: NEGATIVE

## 2021-11-16 LAB — POCT PREGNANCY, URINE: Preg Test, Ur: NEGATIVE

## 2021-11-16 MED ORDER — HYDROCHLOROTHIAZIDE 12.5 MG PO TABS
12.5000 mg | ORAL_TABLET | Freq: Every day | ORAL | Status: DC
  Administered 2021-11-16: 12.5 mg via ORAL
  Filled 2021-11-16: qty 1

## 2021-11-16 MED ORDER — GABAPENTIN 300 MG PO CAPS
300.0000 mg | ORAL_CAPSULE | Freq: Two times a day (BID) | ORAL | Status: DC
  Filled 2021-11-16: qty 1

## 2021-11-16 MED ORDER — ALBUTEROL SULFATE HFA 108 (90 BASE) MCG/ACT IN AERS: 1.0000 | INHALATION_SPRAY | Freq: Four times a day (QID) | RESPIRATORY_TRACT | Status: DC | PRN

## 2021-11-16 MED ORDER — DULOXETINE HCL 60 MG PO CPEP: 60.0000 mg | ORAL_CAPSULE | Freq: Every day | ORAL | Status: DC

## 2021-11-16 MED ORDER — AMLODIPINE BESYLATE 10 MG PO TABS
10.0000 mg | ORAL_TABLET | Freq: Every day | ORAL | Status: DC
Start: 2021-11-16 — End: 2021-11-16
  Administered 2021-11-16: 10 mg via ORAL
  Filled 2021-11-16: qty 1

## 2021-11-16 MED ORDER — METOPROLOL TARTRATE 25 MG PO TABS
25.0000 mg | ORAL_TABLET | Freq: Two times a day (BID) | ORAL | Status: DC
  Administered 2021-11-16: 25 mg via ORAL
  Filled 2021-11-16: qty 1

## 2021-11-16 MED ORDER — IRBESARTAN 75 MG PO TABS
300.0000 mg | ORAL_TABLET | Freq: Every day | ORAL | Status: DC
  Administered 2021-11-16: 300 mg via ORAL
  Filled 2021-11-16: qty 4

## 2021-11-16 MED ORDER — NICOTINE 21 MG/24HR TD PT24
21.0000 mg | MEDICATED_PATCH | Freq: Every day | TRANSDERMAL | Status: DC
Start: 2021-11-16 — End: 2021-11-16
  Filled 2021-11-16: qty 1

## 2021-11-16 MED ORDER — OLMESARTAN-AMLODIPINE-HCTZ 40-10-12.5 MG PO TABS: 1.0000 | ORAL_TABLET | Freq: Every day | ORAL | Status: DC

## 2021-11-16 NOTE — ED Notes (Signed)
Pt is calm and content. She uses the phone often. ?

## 2021-11-16 NOTE — Progress Notes (Signed)
Kathleen Stone received her discharge order, she was presented with her AVS, questions answered.  She retrieved her personal belongings . She was escorted to the lobby to make a call on her phone per her request. ?
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
## 2021-11-16 NOTE — ED Provider Notes (Signed)
FBC/OBS ASAP Discharge Summary  Date and Time: 11/16/2021 3:11 PM  Name: Kathleen Stone  MRN:  [id number]   Discharge Diagnoses:  Final diagnoses:  MDD (major depressive disorder), recurrent episode, moderate ([place])  Involuntary commitment    Subjective: Patient states, that her roommate took her truck and called her boss saying that she was drinking and driving. She states that she was drinking but she was not driving, and was standing on the porch at her house. She states that the night before the cops were called after she had an argument with her wife. She states that the argument was about not wanting to have another threesome with the roommate. She denies making any suicidal threats and states that she would never attempt suicide. She states that she loves herself too much. She states that she never tried to hang herself  with a noose and that she loves watching cowboy shows and was interested in how they make nooses after talking to her co-worker about what she saw on t.v. She denies threatening her wife or roommate  and herself with a knife last month. She states that she keeps a knife in her room for safety because guns are not allowed in the house and she was on her way to put the knife back in the kitchen when her roommate started yelling at her. She states that she was waving the knife in the air as she was talking but never threatened to hurt herself or anyone else.   Stay Summary:  Patient petitioned for IVC by her wife Adair Patter: [phone]).  Affidavit and petition for IVC states:   "Respondent has previously diagnosed with a brain tumor, she has medication and is compliant with her medication regimen.  Family states respondent is threatening to harm herself and her family.  She has brandished a knife and stated that she no longer wished to live.  She is currently destroying property within the home, also she has stated that she is hearing voices.  She also hung an extension cord  over the doorway saying she had nothing to live for anymore.  Family is concerned for her well being as she continues to act erratically".  Patient was admitted to the [facility] continuous assessment for overnight observation and safety. Labs obtained included: CMP, CBC, BAL, A1c, lipid panel, urine pregnancy, TSH, and Covid. Patient was restarted back on home medication: lopressor, Norvasc, HCTz,, Avapro,Neurontin, Cymbalta and albuterol.   Patient continues to deny suicidal or homicidal ideations. She denies auditory or visual hallucinations. There is no objective evidence that the patient is currently responding to internal or external stimuli.  Patient states that she is ready to discharge back home. She states that her wife and roommate moved out of her home yesterday. She denies symptoms of depression. She reports feeling anxious about wanting to leave. Patient denies side effects to current medications. Patient has been observed on the unit without any aggressive, disruptive, psychotic, or self-harm behaviors. Patient verbally contracts for safety. She states that her boss, co-workers and sister are all supportive. She reports working full time at [employer].   I spoke to the patient's wife Tinnie Gens who states that the patient attempted to hang herself last Thursday. She states that the patient threatened to hurt herself, her and the roommate one month ago with a knife. She states that the police was called the night before yesterday because the patient shook her out of bed. She states that the patient was destroying  property in the home last night after the after the argument. She states that the patient broke the glasses, lamps and dishes.   With the patient's verbal consent I spoke to her sister Ms. Glads Herbin [phone] who is a Equities trader. Ms. Donita Brooks states that her sister called her two days ago saying that she was divorcing her wife. She states that she does not have any  safety concerns with the patient discharging home. She states that as far she knows the patient has not made any suicidal statements or attempted suicide in the past. She states that the patient really wants to discharge to go home and check on her property. She states that she is available to check in on her sister.   Total Time spent with patient: 15 minutes  Past Psychiatric History: Hx of MDD. No prior psychiatric hospitalizations.  Past Medical History:  Past Medical History:  Diagnosis Date   Brain tumor (benign) (HCC)    Chronic lower back pain    Negative MRI of hips bilaterally, neg CT scan of A and P; MRI of T and L spine-> very mild deg changes from T4-5 and T7-8, cervical spondylotic changes at C5-6, very mild disc bulge at L2-3 without spinal stenosis, nl ESR, ANA, RF, HIV, TSH, and B12   DJD (degenerative joint disease) of cervical spine    Family history of adverse reaction to anesthesia    "sisters PONV"   Galactorrhea of both breasts 2009   R. sided w/ benign papilloma excised in 4/09. L sided in 5/09   Gallstones    Hyperlipidemia    Hypertension    Migraine    "monthly" (11/15/2016)   Neuromuscular disorder ([place])    left arm and side of face    Neuropathy    Thought to be 2/2 nerve compression in the C-spine   Obesity    Personal history of adenomatous colonic polyp    09/2007 - diminutive adenoma ([facility])   PONV (postoperative nausea and vomiting)    Stroke ([place])    2021- brain tumor bx caused ? stroke-    Tobacco abuse     Past Surgical History:  Procedure Laterality Date   APPENDECTOMY     BRAIN BIOPSY     [facility]- 03-2020   BREAST SURGERY Right 12/2007   central duct excision   CHOLECYSTECTOMY N/A 12/08/2019   Procedure: LAPAROSCOPIC CHOLECYSTECTOMY;  Surgeon: Ralene Ok, MD;  Location: [place];  Service: General;  Laterality: N/A;   COLONOSCOPY  2009   COLONOSCOPY  09/14/2020   COLONOSCOPY W/ BIOPSIES AND POLYPECTOMY  09/2007   Dr. [name]. 2 small  adenomatous polyps, internal hemorrhoids-Grade 1. Rec  routine colonoscopy at age 45.   COLONOSCOPY W/ BIOPSIES AND POLYPECTOMY     NASAL SEPTOPLASTY W/ TURBINOPLASTY Bilateral 04/2008   Septoplasty with bilateral inferior turbinate reduction; Dr. Lucia Gaskins   SHOULDER OPEN ROTATOR CUFF REPAIR Right     after hurting it by moving heavy objecs   Family History:   Family Psychiatric History: No hx reported  Social History:  Social History   Substance and Sexual Activity  Alcohol Use No   Alcohol/week: 0.0 standard drinks     Social History   Substance and Sexual Activity  Drug Use No    Social History   Socioeconomic History   Marital status: Married    Spouse name: Not on file   Number of children: Not on file   Years of education: Not on file  Highest education level: Not on file  Occupational History   Not on file  Tobacco Use   Smoking status: Every Day    Packs/day: 1.50    Years: 31.00    Pack years: 46.50    Types: Cigarettes   Smokeless tobacco: Never  Vaping Use   Vaping Use: Former  Substance and Sexual Activity   Alcohol use: No    Alcohol/week: 0.0 standard drinks   Drug use: No   Sexual activity: Yes    Partners: Female  Other Topics Concern   Not on file  Social History Narrative   Domestic Partner: female has emphysema   Smoked 2 packs/day for 20+ years, down to 1/2ppd now, willing to try to quit    Alcohol use-no   Drug use-no   Regular exercise-no   Social Determinants of Health   Financial Resource Strain: Not on file  Food Insecurity: Not on file  Transportation Needs: Not on file  Physical Activity: Not on file  Stress: Not on file  Social Connections: Not on file   SDOH:  SDOH Screenings   Alcohol Screen: Not on file  Depression (PHQ2-9): Medium Risk   PHQ-2 Score: 20  Financial Resource Strain: Not on file  Food Insecurity: Not on file  Housing: Not on file  Physical Activity: Not on file  Social Connections: Not on file   Stress: Not on file  Tobacco Use: High Risk   Smoking Tobacco Use: Every Day   Smokeless Tobacco Use: Never   Passive Exposure: Not on file  Transportation Needs: Not on file    Tobacco Cessation:  Prescription not provided because: declined  Current Medications:  Current Facility-Administered Medications  Medication Dose Route Frequency Provider Last Rate Last Admin   acetaminophen (TYLENOL) tablet 650 mg  650 mg Oral Q6H PRN Lovena Le, Cody W, PA-C       albuterol (VENTOLIN HFA) 108 (90 Base) MCG/ACT inhaler 1-2 puff  1-2 puff Inhalation Q6H PRN Prescilla Sours, PA-C       alum & mag hydroxide-simeth (MAALOX/MYLANTA) 200-200-20 MG/5ML suspension 30 mL  30 mL Oral Q4H PRN Lovena Le, Cody W, PA-C       irbesartan (AVAPRO) tablet 300 mg  300 mg Oral Daily [name] W, PA-C   300 mg at 11/16/21 8250   And   amLODipine (NORVASC) tablet 10 mg  10 mg Oral Daily [name] W, PA-C   10 mg at 11/16/21 0370   And   hydrochlorothiazide (HYDRODIURIL) tablet 12.5 mg  12.5 mg Oral Daily [name] W, PA-C   12.5 mg at 11/16/21 0851   DULoxetine (CYMBALTA) DR capsule 60 mg  60 mg Oral QHS [name] W, PA-C       gabapentin (NEURONTIN) capsule 300 mg  300 mg Oral BID [name] W, PA-C       magnesium hydroxide (MILK OF MAGNESIA) suspension 30 mL  30 mL Oral Daily PRN [name] W, PA-C       metoprolol tartrate (LOPRESSOR) tablet 25 mg  25 mg Oral BID [name] W, PA-C   25 mg at 11/16/21 0850   nicotine (NICODERM CQ - dosed in mg/24 hours) patch 21 mg  21 mg Transdermal Daily Prescilla Sours, PA-C       Current Outpatient Medications  Medication Sig Dispense Refill   albuterol (PROVENTIL HFA) 108 (90 Base) MCG/ACT inhaler Inhale 1-2 puffs into the lungs every 6 (six) hours as needed for wheezing or shortness of  breath. 8 g 3   atorvastatin (LIPITOR) 40 MG tablet Take 1 tablet (40 mg total) by mouth daily. 90 tablet 3   DULoxetine (CYMBALTA) 60 MG capsule Take 1 capsule (60 mg total) by  mouth daily. 90 capsule 2   gabapentin (NEURONTIN) 300 MG capsule Take 1 capsule (300 mg total) by mouth 2 (two) times daily. 180 capsule 2   metoprolol tartrate (LOPRESSOR) 25 MG tablet Take 1 tablet (25 mg total) by mouth 2 (two) times daily. 180 tablet 1   nitroGLYCERIN (NITROSTAT) 0.4 MG SL tablet Place 1 tablet (0.4 mg total) under the tongue every 5 (five) minutes as needed for chest pain. 25 tablet 1   Olmesartan-amLODIPine-HCTZ 40-10-12.5 MG TABS Take 1 tablet by mouth daily. 30 tablet 3    PTA Medications: (Not in a hospital admission)   Musculoskeletal  Strength & Muscle Tone: within normal limits Gait & Station: normal Patient leans: N/A  Psychiatric Specialty Exam  Presentation  General Appearance: Appropriate for Environment  Eye Contact:Good  Speech:Clear and Coherent  Speech Volume:Normal  Handedness:No data recorded  Mood and Affect  Mood:Dysphoric  Affect:Congruent   Thought Process  Thought Processes:Coherent  Descriptions of Associations:Intact  Orientation:Full (Time, Place and Person)  Thought Content:Logical     Hallucinations:Hallucinations: None  Ideas of Reference:None  Suicidal Thoughts:Suicidal Thoughts: No  Homicidal Thoughts:Homicidal Thoughts: No   Sensorium  Memory:Immediate Fair; Recent Fair; Remote Fair  Judgment:Fair  Insight:Fair   Executive Functions  Concentration:Fair  Attention Span:Fair  [place]  Language:Good   Psychomotor Activity  Psychomotor Activity:Psychomotor Activity: Normal   Assets  Assets:Communication Skills; Desire for Improvement; Financial Resources/Insurance; Housing; Leisure Time; Physical Health; Resilience; Social Support; Transportation; Talents/Skills; Vocational/Educational   Sleep  Sleep:Sleep: Fair Number of Hours of Sleep: 8   Nutritional Assessment (For OBS and FBC admissions only) Has the patient had a weight loss or gain of 10 pounds or  more in the last 3 months?: No Has the patient had a decrease in food intake/or appetite?: No Does the patient have dental problems?: No Does the patient have eating habits or behaviors that may be indicators of an eating disorder including binging or inducing vomiting?: No Has the patient recently lost weight without trying?: 0 Has the patient been eating poorly because of a decreased appetite?: 0 Malnutrition Screening Tool Score: 0    Physical Exam  Physical Exam Constitutional:      Appearance: Normal appearance.  HENT:     Head: Normocephalic.  Eyes:     Conjunctiva/sclera: Conjunctivae normal.  Cardiovascular:     Rate and Rhythm: Normal rate.  Pulmonary:     Effort: Pulmonary effort is normal.  Musculoskeletal:        General: Normal range of motion.     Cervical back: Normal range of motion.  Neurological:     Mental Status: She is alert and oriented to person, place, and time.   Review of Systems  Constitutional: Negative.   HENT: Negative.    Eyes: Negative.   Respiratory: Negative.    Cardiovascular: Negative.   Gastrointestinal: Negative.   Genitourinary: Negative.   Musculoskeletal: Negative.   Skin: Negative.   Neurological: Negative.   Endo/Heme/Allergies: Negative.   Blood pressure 127/79, pulse 89, temperature (!) 97.4 F (36.3 C), temperature source Oral, resp. rate 20, last menstrual period 02/07/2016, SpO2 99 %. There is no height or weight on file to calculate BMI.  Demographic Factors:  NA  Loss Factors: NA  Historical Factors: Impulsivity  Risk Reduction Factors:   Sense of responsibility to family, Employed, Positive social support, Positive therapeutic relationship, and Positive coping skills or problem solving skills  Continued Clinical Symptoms:  Previous Psychiatric Diagnoses and Treatments  Cognitive Features That Contribute To Risk:  None    Suicide Risk:  Minimal: No identifiable suicidal ideation.  Patients presenting with  no risk factors but with morbid ruminations; may be classified as minimal risk based on the severity of the depressive symptoms   Plan Of Care/Follow-up recommendations: Activity as tolerated. Diet as recommended by primary care physician.Keep all scheduled follow-up appointments as recommended.   Disposition: Patient shows no evidence of acute risk of harm to self or others and is psych cleared for discharge, to follow up with established outpatient psychiatry.   Alexea Blase L, NP 11/16/2021, 3:11 PM

## 2021-11-16 NOTE — ED Notes (Signed)
Pt refused lunch when it was offered. She stated that she was not going to eat our food, while she is here. ?

## 2021-11-16 NOTE — Progress Notes (Signed)
Received Kathleen Stone this AM sitting up on the side of the bed, she ate breakfast and later was compliant with most of her scheduled medications.She refused the Gabapentin and nicotine patch. She denied all of the psychiatric symptoms  including feeling suicidal. She is waiting for the doctor to round on her with the intention of going home as soon as possible. ?

## 2021-11-16 NOTE — Discharge Instructions (Addendum)
Discharge recommendations:  ?Patient is to take medications as prescribed. ?Please see information for follow-up appointment with psychiatry and therapy. ?Please follow up with your primary care provider for all medical related needs.  ? ?Therapy: We recommend that patient participate in individual therapy to address mental health concerns. ? ?Medications: The parent/guardian is to contact a medical professional and/or outpatient provider to address any new side effects that develop. Parent/guardian should update outpatient providers of any new medications and/or medication changes.  ? ?Safety:  ?The patient should abstain from use of illicit substances/drugs and abuse of any medications. ?If symptoms worsen or do not continue to improve or if the patient becomes actively suicidal or homicidal then it is recommended that the patient return to the closest hospital emergency department, the [facility], or call 911 for further evaluation and treatment. ?National Suicide Prevention Lifeline 1-800-SUICIDE or (606) 076-3572. ? ?About 988 ?988 offers 24/7 access to trained crisis counselors who can help people experiencing mental health-related distress. People can call or text 988 or chat 988lifeline.org for themselves or if they are worried about a loved one who may need crisis support.  ? ?Please contact one of the following facilities to start medication management and therapy services:  ? ?[facility] ?[address]  ?[place], [place] [address] ?[phone]  ? ?[facility]  ?[place], [place] [address] ?[phone] ?[facility]  ?[address], [place], [place] [address] ?[phone] ? ?[facility]  ?[address]  ?[place], [place] [address] ?[phone] ? ?[facility]  ?[address] ?[place], [place] [address] ?[phone] ? ?[facility]  ?[address],  ?[place], [place] [address] ?[phone] ? ? ? ?  ?

## 2021-11-16 NOTE — ED Notes (Addendum)
Note with phone number to estranged  spouse offered to Kathleen Stone who requested that no contact be made with her  and doesn't want her status, treatment plan, or condition discussed with her ex-spouse by staff. She was informed that since she was the petitioner that the events  leading to the IVC would have to be discussed . ?

## 2021-11-16 NOTE — ED Notes (Signed)
Pt refused breakfast 

## 2021-11-16 NOTE — ED Notes (Signed)
Pt seems to be a little frustrated as she continues to be here but she remains calm. ?

## 2021-11-16 NOTE — ED Notes (Signed)
Pt sleeping no distress, night tares or disturbed sleep noted ?

## 2021-11-23 ENCOUNTER — Telehealth (HOSPITAL_COMMUNITY): Payer: Self-pay | Admitting: Internal Medicine

## 2021-11-23 NOTE — BH Assessment (Signed)
Care Management - [facility]UC Follow Up Discharges  ° °Writer attempted to make contact with patient today and was unsuccessful.  Writer left a HIPPA compliant voice message.  ° °Per chart review, patient was provided with outpatient resources. ° °

## 2021-11-30 ENCOUNTER — Ambulatory Visit: Payer: 59 | Admitting: Behavioral Health

## 2021-11-30 ENCOUNTER — Other Ambulatory Visit: Payer: Self-pay

## 2021-11-30 DIAGNOSIS — Z630 Problems in relationship with spouse or partner: Secondary | ICD-10-CM

## 2021-11-30 DIAGNOSIS — F331 Major depressive disorder, recurrent, moderate: Secondary | ICD-10-CM

## 2021-11-30 DIAGNOSIS — F419 Anxiety disorder, unspecified: Secondary | ICD-10-CM

## 2021-11-30 NOTE — BH Specialist Note (Signed)
Integrated Behavioral Health Initial In-Person Visit ? ?MRN: [id number] ?Name: Kathleen Stone ? ?Number of [facility] Clinician visits: 1 ?Session Start time: 0900  ?Session End time: 0930 ?Total time in minutes: 30 min ? ?Types of Service: Individual psychotherapy and Introduction only ? ?Interpretor:No. Interpretor Name and Language: n/a ? ? ? Warm Hand Off Completed. ?  ? ?  ? ? ?Subjective: ?Kathleen Stone is a 56 y.o. female accompanied by  self ?Patient was referred by Dr. Angelia Mould, DO for relational issues & mental health wellness. ?Patient reports the following symptoms/concerns: elevated anx/dep & anger rxn towards Wife who has recently initiated an IVC which lasted one day. Pt was able to rest prior to her d/c. Pt is concerned for her mental health & wants an objective listener to help her process the events in her relationship. ?Duration of problem: months to years; Severity of problem: moderate ? ?Objective: ?Mood: Angry, Anxious, and Depressed and Affect: Appropriate ?Risk of harm to self or others: No plan to harm self or others ? ?Life Context: ?Family and Social: Pt is one of 8 Bros & Str in her Family. She has been married to her Wife since 2016. Recently, her Wife has been choosing beh that is not positive for her or the relationship. Two wks ago, her Wife initiated an IVC for which Pt was [facility] to [facility]. She was d/c'd the same day. ? ?Pt recently lost a Brother; "his heart gave out". ? ?School/Work: Pt does not attend Sch. 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 She works a 5-6d work week from 16am-7:45pm. 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 She takes Gabapentin '300mg'$  BID. She also takes Duloxetine '60mg'$  for pain/dep daily. Pt admits she uses these meds prn taking only the  Gaba in the evening & using the Duloxetine prn throughout the week. ?Life Changes: Death of Brother who has battled DM died on 12-25-22. ? ?Patient and/or Family's Strengths/Protective Factors: ?Social connections, Social and Emotional competence, Concrete supports in place (healthy food, safe environments, etc.), and Sense of purpose ? ?Goals Addressed: ?Patient will: ?Reduce symptoms of: anxiety, depression, stress, and anger rxn when provoked by relational circumstances. ?Increase knowledge and/or ability of: coping skills, healthy habits, stress reduction, and grief work   ?Demonstrate ability to: Increase healthy adjustment to current life circumstances and Improve medication compliance ? ?Progress towards Goals: ?Estb 'd today: Pt will attend in person therapy sessions as scheduled. ? ?Interventions: ?Interventions utilized: Behavioral Activation and Supportive Counseling & medication monitoring ?Standardized Assessments completed:  screeners prn ? ?Patient and/or Family Response: Pt receptive to visit today & requests future appts ? ?Patient Centered Plan: ?Patient is on the following Treatment Plan(s):  Pt will try suggestions offered in session for mgmt of anger. Pt will estb & maintain healthy boundaries w/others. ? ?Assessment: ?Patient currently experiencing elevated concerns for her mental health wellness. Pt realizes it will be positive for her to speak to an objective person & learn how to keep her cool in the midst of provocation. Pt wants to reconcile her anger & the specifics of her relationship to strengthen it & move forward. ?  ?Patient may benefit from cont'd Cslg. ? ?Plan: ?Follow up with behavioral health clinician on : 2-3 wks for 60 min in person ?Behavioral recommendations:  Use the suggestions offered to manage your anger. Keep good boundaries w/others. Do not allow the drama in relationship to prevent you from fllwing your own truth; this disconnects ppl from themselves & is  distruptive. ?Referral(s): [facility] (In Clinic) ?"From scale of 1-10, how likely are you to follow plan?": 8 ? ?Donnetta Hutching, LMFT ? ? ? ? ? ? ? ? ?

## 2021-12-26 ENCOUNTER — Ambulatory Visit: Payer: 59 | Admitting: Behavioral Health

## 2022-02-20 NOTE — Progress Notes (Signed)
   CC: tingling on the face  HPI:  [name] is a 56 y.o. with medical history as below presenting to [facility] for follow on face tingling.   Please see problem-based list for further details, assessments, and plans.  Past Medical History:  Diagnosis Date   Brain tumor (benign) (HCC)    Chronic lower back pain    Negative MRI of hips bilaterally, neg CT scan of A and P; MRI of T and L spine-> very mild deg changes from T4-5 and T7-8, cervical spondylotic changes at C5-6, very mild disc bulge at L2-3 without spinal stenosis, nl ESR, ANA, RF, HIV, TSH, and B12   DJD (degenerative joint disease) of cervical spine    Family history of adverse reaction to anesthesia    "sisters PONV"   Galactorrhea of both breasts 2009   R. sided w/ benign papilloma excised in 4/09. L sided in 5/09   Gallstones    Hyperlipidemia    Hypertension    Migraine    "monthly" (11/15/2016)   Neuromuscular disorder ([place])    left arm and side of face    Neuropathy    Thought to be 2/2 nerve compression in the C-spine   Obesity    Personal history of adenomatous colonic polyp    09/2007 - diminutive adenoma ([facility])   PONV (postoperative nausea and vomiting)    Stroke ([place])    2021- brain tumor bx caused ? stroke-    Tobacco abuse    Review of Systems:  Review of system negative unless stated in the problem list or HPI.    Physical Exam:  Vitals:   02/21/22 0856  BP: (!) 180/71  Pulse: 67  Temp: 98.1 F (36.7 C)  TempSrc: Oral  SpO2: 99%  Weight: 228 lb 8 oz (103.6 kg)  Height: $Remove'5\' 6"'[id number]$  (1.676 m)    Physical Exam General: NAD HENT: NCAT Lungs: CTAB, no wheeze, rhonchi or rales.  Cardiovascular: Normal heart sounds, no r/m/g, 2+ pulses in all extremities. No LE edema Abdomen: No TTP, normal bowel sounds MSK: No asymmetry or muscle atrophy.  Skin: no lesions noted on exposed skin Neuro: Alert and oriented x4. CN II-XII intact, pareshestia on left side of face and left arm. BUE 5/5, BLE 5/5,  good grip.  Psych: Normal mood and normal affect   Assessment & Plan:   See Encounters Tab for problem based charting.  Patient discussed with Dr. Edrick Kins, MD

## 2022-02-21 ENCOUNTER — Encounter: Payer: Self-pay | Admitting: Internal Medicine

## 2022-02-21 ENCOUNTER — Other Ambulatory Visit: Payer: Self-pay

## 2022-02-21 ENCOUNTER — Ambulatory Visit (INDEPENDENT_AMBULATORY_CARE_PROVIDER_SITE_OTHER): Payer: Commercial Managed Care - HMO | Admitting: Internal Medicine

## 2022-02-21 VITALS — BP 180/71 | HR 67 | Temp 98.1°F | Ht 66.0 in | Wt 228.5 lb

## 2022-02-21 DIAGNOSIS — F17210 Nicotine dependence, cigarettes, uncomplicated: Secondary | ICD-10-CM

## 2022-02-21 DIAGNOSIS — E785 Hyperlipidemia, unspecified: Secondary | ICD-10-CM

## 2022-02-21 DIAGNOSIS — C719 Malignant neoplasm of brain, unspecified: Secondary | ICD-10-CM | POA: Diagnosis not present

## 2022-02-21 DIAGNOSIS — F419 Anxiety disorder, unspecified: Secondary | ICD-10-CM | POA: Diagnosis not present

## 2022-02-21 DIAGNOSIS — F32A Depression, unspecified: Secondary | ICD-10-CM

## 2022-02-21 DIAGNOSIS — I10 Essential (primary) hypertension: Secondary | ICD-10-CM | POA: Diagnosis not present

## 2022-02-21 MED ORDER — SERTRALINE HCL 25 MG PO TABS: 25.0000 mg | ORAL_TABLET | Freq: Every day | ORAL | 0 refills | Status: DC

## 2022-02-21 MED ORDER — PREGABALIN 50 MG PO CAPS: 50.0000 mg | ORAL_CAPSULE | Freq: Three times a day (TID) | ORAL | 2 refills | Status: DC

## 2022-02-21 NOTE — Patient Instructions (Addendum)
Kathleen Stone, it was a pleasure seeing you today! You endorsed feeling well today. Below are some of the things we talked about this visit. We look forward to seeing you in the follow up appointment!  Today we discussed: We will restart the same blood pressure medications that you were previously taking. Please check your blood pressure and bring a log to the next visit.  For your tingling sensation; I would like you to follow with neurosurgery. Please make an appointment. I will order imaging of your brain. We will change your gabapentin to lyrica to see if you are able to tolerate it more.  For your depression, we will start a new medicine called zoloft and I will encourage you to make an appointment at [facility] by calling [phone].  I would like to see you in 3 weeks.   I have ordered the following labs today:  Lab Orders  No laboratory test(s) ordered today      Referrals ordered today:   Referral Orders  No referral(s) requested today     I have ordered the following medication/changed the following medications:   Stop the following medications: Medications Discontinued During This Encounter  Medication Reason   gabapentin (NEURONTIN) 300 MG capsule Discontinued by provider   sertraline (ZOLOFT) 25 MG tablet Discontinued by provider     Start the following medications: Meds ordered this encounter  Medications   DISCONTD: sertraline (ZOLOFT) 25 MG tablet    Sig: Take 1 tablet (25 mg total) by mouth daily.    Dispense:  90 tablet    Refill:  0   pregabalin (LYRICA) 50 MG capsule    Sig: Take 1 capsule (50 mg total) by mouth 3 (three) times daily. Start taking it twice daily for 1 week and increase to three times daily after that.    Dispense:  90 capsule    Refill:  2     Follow-up: 3 week follow up  Please make sure to arrive 15 minutes prior to your next appointment. If you arrive late, you may be asked to reschedule.   We look forward  to seeing you next time. Please call our clinic at [phone] if you have any questions or concerns. The best time to call is Monday-Friday from 9am-4pm, but there is someone available 24/7. If after hours or the weekend, call the main hospital number and ask for the Internal Medicine Resident On-Call. If you need medication refills, please notify your pharmacy one week in advance and they will send [place] a request.  Thank you for letting us take part in your care. Wishing you the best!  Thank you, Idamae Schuller, MD

## 2022-02-23 NOTE — Assessment & Plan Note (Signed)
Assessment: Patient's has depression that is uncontrolled. Today's PHQ9 score was 19. She describes a difficult social situation with her partner leaving her. Current medications include duloxetine 60 mg qd. Patient endorsed adherence. Patient denied any SI/HI. -Continue Duloxetine 60 mg daily -Gave patient information on [facility] for counseling  -Will add another antidepressant to her regimen at follow up in 3 weeks if no improvement.

## 2022-02-23 NOTE — Progress Notes (Signed)
Internal Medicine Clinic Attending  Case discussed with Dr. Khan  At the time of the visit.  We reviewed the resident's history and exam and pertinent patient test results.  I agree with the assessment, diagnosis, and plan of care documented in the resident's note.  

## 2022-02-23 NOTE — Assessment & Plan Note (Signed)
Assessment/Plan Patient has HLD with goal of primary. Last lipid panel on 11/16/2021 showed Chol 174, LDL 106 . Reports poor compliance to lipitor 40 mg daily. Counseled on the importance of regular medication use, continued lifestyle modifications, including: weight loss, daily exercise, and healthy diet with limited processed and fatty foods.  Restart lipitor 40 mg daily. Will order lipid panel at next visit with other labs.  Encouraged to continue lifestyle modifications.

## 2022-02-23 NOTE — Assessment & Plan Note (Signed)
Assessment/Plan BP at not at goal (< 130/80). BP in clinic today 180/71, HR 67. Does not check blood pressure at home;  educated on the importance of it. Home medications include metoprolol 25 mg daily and olmesartan-amlodipine-HCTZ 40-10-12.5. Reports poor medication compliance states she has not taken them in a few months.  Denies headaches, vision changes, lightheadedness, chest pain, SHOB, leg swelling or changes in speech. Last creatinine was 1.06 in 11/2021. Counseled on the importance of daily exercise, low salt diet, and weight loss. -Restart metoprolol and combo pill as above.  -BP log and bring to next visit in 3 weeks; we will perform lab work at that time.  -Continue lifestyle changes

## 2022-02-23 NOTE — Assessment & Plan Note (Signed)
Patient's complaint of tingling on face and facial pain on left side most likely 2/2 to hx of astrocytoma diagnosed via biopsy in 03/17/20. Last MRI done 09/2020 showed stable lesion and plan was to follow imaging at 6 month as patient is poor surgical candidate and location of tumor makes intervention difficult. Neuro exam shows no focal deficit but has paresthesia on the left side of her face and upper body. On duloxetine 60 mg and gabapentin 300 mg BID. No new imaging although imaging recommended annually by neurosurgery.  -Discontinued Gabapentin, and started Lyrica 50 mg TID. -Ordered brain MRI and instructed patient to reschedule with neurosurgery as she has insurance now.

## 2022-03-05 ENCOUNTER — Other Ambulatory Visit: Payer: Self-pay | Admitting: *Deleted

## 2022-03-05 DIAGNOSIS — E782 Mixed hyperlipidemia: Secondary | ICD-10-CM

## 2022-03-05 DIAGNOSIS — C719 Malignant neoplasm of brain, unspecified: Secondary | ICD-10-CM

## 2022-03-05 DIAGNOSIS — I10 Essential (primary) hypertension: Secondary | ICD-10-CM

## 2022-03-05 NOTE — Telephone Encounter (Signed)
Called pt to verify if she's changing pharmacy; stated she is changing to [facility] for home delivery.

## 2022-03-06 MED ORDER — DULOXETINE HCL 60 MG PO CPEP: 60.0000 mg | ORAL_CAPSULE | Freq: Every day | ORAL | 2 refills | Status: DC

## 2022-03-06 MED ORDER — OLMESARTAN-AMLODIPINE-HCTZ 40-10-12.5 MG PO TABS: 1.0000 | ORAL_TABLET | Freq: Every day | ORAL | 3 refills | Status: DC

## 2022-03-06 MED ORDER — ATORVASTATIN CALCIUM 40 MG PO TABS: 40.0000 mg | ORAL_TABLET | Freq: Every day | ORAL | 3 refills | Status: DC

## 2022-03-06 MED ORDER — METOPROLOL TARTRATE 25 MG PO TABS: 25.0000 mg | ORAL_TABLET | Freq: Two times a day (BID) | ORAL | 1 refills | Status: DC

## 2022-03-07 ENCOUNTER — Other Ambulatory Visit: Payer: Self-pay | Admitting: *Deleted

## 2022-03-11 ENCOUNTER — Encounter: Payer: Self-pay | Admitting: *Deleted

## 2022-03-12 ENCOUNTER — Observation Stay (HOSPITAL_COMMUNITY)
Admission: EM | Admit: 2022-03-12 | Discharge: 2022-03-13 | Disposition: A | Discharge status: Home / Self Care | Payer: Commercial Managed Care - HMO | Attending: Internal Medicine | Admitting: Internal Medicine

## 2022-03-12 ENCOUNTER — Emergency Department (HOSPITAL_COMMUNITY): Payer: Commercial Managed Care - HMO

## 2022-03-12 ENCOUNTER — Other Ambulatory Visit: Payer: Self-pay

## 2022-03-12 ENCOUNTER — Encounter (HOSPITAL_COMMUNITY): Payer: Self-pay

## 2022-03-12 DIAGNOSIS — Z720 Tobacco use: Secondary | ICD-10-CM

## 2022-03-12 DIAGNOSIS — Z79899 Other long term (current) drug therapy: Secondary | ICD-10-CM | POA: Insufficient documentation

## 2022-03-12 DIAGNOSIS — R3129 Other microscopic hematuria: Secondary | ICD-10-CM | POA: Diagnosis not present

## 2022-03-12 DIAGNOSIS — R55 Syncope and collapse: Secondary | ICD-10-CM | POA: Diagnosis not present

## 2022-03-12 DIAGNOSIS — Z8673 Personal history of transient ischemic attack (TIA), and cerebral infarction without residual deficits: Secondary | ICD-10-CM | POA: Diagnosis not present

## 2022-03-12 DIAGNOSIS — R079 Chest pain, unspecified: Principal | ICD-10-CM | POA: Diagnosis present

## 2022-03-12 DIAGNOSIS — F17210 Nicotine dependence, cigarettes, uncomplicated: Secondary | ICD-10-CM | POA: Insufficient documentation

## 2022-03-12 DIAGNOSIS — I10 Essential (primary) hypertension: Secondary | ICD-10-CM | POA: Diagnosis not present

## 2022-03-12 DIAGNOSIS — R42 Dizziness and giddiness: Secondary | ICD-10-CM | POA: Diagnosis present

## 2022-03-12 DIAGNOSIS — Z85841 Personal history of malignant neoplasm of brain: Secondary | ICD-10-CM | POA: Insufficient documentation

## 2022-03-12 DIAGNOSIS — J449 Chronic obstructive pulmonary disease, unspecified: Secondary | ICD-10-CM | POA: Diagnosis not present

## 2022-03-12 DIAGNOSIS — E876 Hypokalemia: Secondary | ICD-10-CM | POA: Insufficient documentation

## 2022-03-12 DIAGNOSIS — R9439 Abnormal result of other cardiovascular function study: Secondary | ICD-10-CM | POA: Insufficient documentation

## 2022-03-12 HISTORY — DX: Chest pain, unspecified: R07.9

## 2022-03-12 LAB — COMPREHENSIVE METABOLIC PANEL
ALT: 23 U/L (ref 0–44)
AST: 23 U/L (ref 15–41)
Albumin: 3.7 g/dL (ref 3.5–5.0)
Alkaline Phosphatase: 77 U/L (ref 38–126)
Anion gap: 11 (ref 5–15)
BUN: 5 mg/dL — ABNORMAL LOW (ref 6–20)
CO2: 24 mmol/L (ref 22–32)
Calcium: 9.6 mg/dL (ref 8.9–10.3)
Chloride: 107 mmol/L (ref 98–111)
Creatinine, Ser: 0.95 mg/dL (ref 0.44–1.00)
GFR, Estimated: >60 mL/min (ref >60)
Glucose, Bld: 107 mg/dL — ABNORMAL HIGH (ref 70–99)
Potassium: 2.8 mmol/L — ABNORMAL LOW (ref 3.5–5.1)
Sodium: 142 mmol/L (ref 135–145)
Total Bilirubin: 1.5 mg/dL — ABNORMAL HIGH (ref 0.3–1.2)
Total Protein: 7 g/dL (ref 6.5–8.1)

## 2022-03-12 LAB — URINALYSIS, ROUTINE W REFLEX MICROSCOPIC
Bilirubin Urine: NEGATIVE
Glucose, UA: NEGATIVE mg/dL
Ketones, ur: NEGATIVE mg/dL
Leukocytes,Ua: NEGATIVE
Nitrite: NEGATIVE
Protein, ur: NEGATIVE mg/dL
Specific Gravity, Urine: 1.003 — ABNORMAL LOW (ref 1.005–1.030)
pH: 6 (ref 5.0–8.0)

## 2022-03-12 LAB — CBC
HCT: 47.1 % — ABNORMAL HIGH (ref 36.0–46.0)
Hemoglobin: 15.9 g/dL — ABNORMAL HIGH (ref 12.0–15.0)
MCH: 29.1 pg (ref 26.0–34.0)
MCHC: 33.8 g/dL (ref 30.0–36.0)
MCV: 86.3 fL (ref 80.0–100.0)
Platelets: 217 10*3/uL (ref 150–400)
RBC: 5.46 MIL/uL — ABNORMAL HIGH (ref 3.87–5.11)
RDW: 14.4 % (ref 11.5–15.5)
WBC: 10.3 10*3/uL (ref 4.0–10.5)
nRBC: 0 % (ref 0.0–0.2)

## 2022-03-12 LAB — TROPONIN I (HIGH SENSITIVITY)
Troponin I (High Sensitivity): 19 ng/L — ABNORMAL HIGH (ref <18)
Troponin I (High Sensitivity): 20 ng/L — ABNORMAL HIGH (ref <18)

## 2022-03-12 LAB — D-DIMER, QUANTITATIVE: D-Dimer, Quant: 0.57 ug/mL-FEU — ABNORMAL HIGH (ref 0.00–0.50)

## 2022-03-12 LAB — MAGNESIUM: Magnesium: 2 mg/dL (ref 1.7–2.4)

## 2022-03-12 MED ORDER — ASPIRIN 325 MG PO TABS
325.0000 mg | ORAL_TABLET | Freq: Every day | ORAL | Status: DC
  Administered 2022-03-12 – 2022-03-13 (×2): 325 mg via ORAL
  Filled 2022-03-12 (×2): qty 1

## 2022-03-12 MED ORDER — RIVAROXABAN 10 MG PO TABS
10.0000 mg | ORAL_TABLET | Freq: Every day | ORAL | Status: DC
Start: 2022-03-13 — End: 2022-03-13
  Administered 2022-03-13: 10 mg via ORAL
  Filled 2022-03-12: qty 1

## 2022-03-12 MED ORDER — POTASSIUM CHLORIDE CRYS ER 20 MEQ PO TBCR
40.0000 meq | EXTENDED_RELEASE_TABLET | Freq: Once | ORAL | Status: AC
  Administered 2022-03-12: 40 meq via ORAL
  Filled 2022-03-12: qty 2

## 2022-03-12 MED ORDER — ACETAMINOPHEN 325 MG PO TABS: 650.0000 mg | ORAL_TABLET | Freq: Four times a day (QID) | ORAL | Status: DC | PRN

## 2022-03-12 MED ORDER — ACETAMINOPHEN 650 MG RE SUPP: 650.0000 mg | Freq: Four times a day (QID) | RECTAL | Status: DC | PRN

## 2022-03-12 MED ORDER — MAGNESIUM OXIDE -MG SUPPLEMENT 400 (240 MG) MG PO TABS
800.0000 mg | ORAL_TABLET | Freq: Once | ORAL | Status: AC
  Administered 2022-03-12: 800 mg via ORAL
  Filled 2022-03-12: qty 2

## 2022-03-12 MED ORDER — NITROGLYCERIN 0.4 MG SL SUBL: 0.4000 mg | SUBLINGUAL_TABLET | SUBLINGUAL | Status: DC | PRN

## 2022-03-12 MED ORDER — LACTATED RINGERS IV BOLUS
1000.0000 mL | Freq: Once | INTRAVENOUS | Status: AC
  Administered 2022-03-12: 1000 mL via INTRAVENOUS

## 2022-03-12 MED ORDER — ATORVASTATIN CALCIUM 40 MG PO TABS
40.0000 mg | ORAL_TABLET | Freq: Every day | ORAL | Status: DC
  Administered 2022-03-13: 40 mg via ORAL
  Filled 2022-03-12: qty 1

## 2022-03-12 MED ORDER — SENNOSIDES-DOCUSATE SODIUM 8.6-50 MG PO TABS: 1.0000 | ORAL_TABLET | Freq: Every evening | ORAL | Status: DC | PRN

## 2022-03-12 MED ORDER — LIDOCAINE 5 % EX PTCH
1.0000 | MEDICATED_PATCH | CUTANEOUS | Status: DC
  Administered 2022-03-13: 1 via TRANSDERMAL
  Filled 2022-03-12: qty 1

## 2022-03-12 MED ORDER — DULOXETINE HCL 60 MG PO CPEP
60.0000 mg | ORAL_CAPSULE | Freq: Every day | ORAL | Status: DC
  Administered 2022-03-13: 60 mg via ORAL
  Filled 2022-03-12: qty 1

## 2022-03-12 MED ORDER — OLMESARTAN-AMLODIPINE-HCTZ 40-10-12.5 MG PO TABS: 1.0000 | ORAL_TABLET | Freq: Every day | ORAL | Status: DC

## 2022-03-12 MED ORDER — PREGABALIN 50 MG PO CAPS
50.0000 mg | ORAL_CAPSULE | Freq: Three times a day (TID) | ORAL | Status: DC
  Administered 2022-03-13 (×2): 50 mg via ORAL
  Filled 2022-03-12 (×2): qty 1

## 2022-03-12 MED ORDER — FAMOTIDINE 20 MG PO TABS
10.0000 mg | ORAL_TABLET | Freq: Two times a day (BID) | ORAL | Status: DC
  Administered 2022-03-13 (×2): 10 mg via ORAL
  Filled 2022-03-12 (×2): qty 1

## 2022-03-13 ENCOUNTER — Encounter (HOSPITAL_COMMUNITY): Payer: Self-pay | Admitting: Internal Medicine

## 2022-03-13 ENCOUNTER — Observation Stay (HOSPITAL_BASED_OUTPATIENT_CLINIC_OR_DEPARTMENT_OTHER): Payer: Commercial Managed Care - HMO

## 2022-03-13 DIAGNOSIS — R55 Syncope and collapse: Secondary | ICD-10-CM | POA: Diagnosis not present

## 2022-03-13 DIAGNOSIS — Z720 Tobacco use: Secondary | ICD-10-CM

## 2022-03-13 DIAGNOSIS — R072 Precordial pain: Secondary | ICD-10-CM | POA: Diagnosis not present

## 2022-03-13 DIAGNOSIS — R079 Chest pain, unspecified: Secondary | ICD-10-CM | POA: Diagnosis not present

## 2022-03-13 LAB — BASIC METABOLIC PANEL
Anion gap: 7 (ref 5–15)
BUN: 6 mg/dL (ref 6–20)
CO2: 27 mmol/L (ref 22–32)
Calcium: 9 mg/dL (ref 8.9–10.3)
Chloride: 108 mmol/L (ref 98–111)
Creatinine, Ser: 0.88 mg/dL (ref 0.44–1.00)
GFR, Estimated: >60 mL/min (ref >60)
Glucose, Bld: 138 mg/dL — ABNORMAL HIGH (ref 70–99)
Potassium: 3.6 mmol/L (ref 3.5–5.1)
Sodium: 142 mmol/L (ref 135–145)

## 2022-03-13 LAB — CBC
HCT: 41.7 % (ref 36.0–46.0)
Hemoglobin: 13.5 g/dL (ref 12.0–15.0)
MCH: 28.3 pg (ref 26.0–34.0)
MCHC: 32.4 g/dL (ref 30.0–36.0)
MCV: 87.4 fL (ref 80.0–100.0)
Platelets: 192 10*3/uL (ref 150–400)
RBC: 4.77 MIL/uL (ref 3.87–5.11)
RDW: 14.4 % (ref 11.5–15.5)
WBC: 7.9 10*3/uL (ref 4.0–10.5)
nRBC: 0 % (ref 0.0–0.2)

## 2022-03-13 LAB — HIV ANTIBODY (ROUTINE TESTING W REFLEX): HIV Screen 4th Generation wRfx: NONREACTIVE

## 2022-03-13 LAB — ECHOCARDIOGRAM COMPLETE
Area-P 1/2: 2.11 cm2
S' Lateral: 2.5 cm

## 2022-03-13 MED ORDER — NICOTINE 21 MG/24HR TD PT24: 21.0000 mg | MEDICATED_PATCH | TRANSDERMAL | 1 refills | Status: DC

## 2022-03-13 MED ORDER — AMLODIPINE BESYLATE 10 MG PO TABS
10.0000 mg | ORAL_TABLET | Freq: Every day | ORAL | Status: DC
  Administered 2022-03-13: 10 mg via ORAL
  Filled 2022-03-13: qty 1

## 2022-03-13 MED ORDER — DULOXETINE HCL 60 MG PO CPEP: 60.0000 mg | ORAL_CAPSULE | Freq: Every day | ORAL | Status: DC

## 2022-03-13 MED ORDER — HYDROCHLOROTHIAZIDE 12.5 MG PO TABS
12.5000 mg | ORAL_TABLET | Freq: Every day | ORAL | Status: DC
Start: 2022-03-13 — End: 2022-03-13
  Administered 2022-03-13: 12.5 mg via ORAL
  Filled 2022-03-13: qty 1

## 2022-03-13 MED ORDER — ORAL CARE MOUTH RINSE: 15.0000 mL | OROMUCOSAL | Status: DC | PRN

## 2022-03-13 MED ORDER — FAMOTIDINE 10 MG PO TABS: 10.0000 mg | ORAL_TABLET | Freq: Two times a day (BID) | ORAL | 0 refills | Status: DC

## 2022-03-13 MED ORDER — IRBESARTAN 150 MG PO TABS
300.0000 mg | ORAL_TABLET | Freq: Every day | ORAL | Status: DC
  Administered 2022-03-13: 300 mg via ORAL
  Filled 2022-03-13: qty 2

## 2022-03-13 MED ORDER — AMLODIPINE BESYLATE 5 MG PO TABS: 5.0000 mg | ORAL_TABLET | Freq: Every day | ORAL | 11 refills | Status: DC

## 2022-03-13 NOTE — Discharge Summary (Signed)
Name: Kathleen Stone MRN: [id number] DOB: 1965-11-30 56 y.o. PCP: Christiana Fuchs, DO  Date of Admission: 03/12/2022  3:36 PM Date of Discharge: 03/13/22 Attending Physician: Dr. Philipp Ovens  Discharge Diagnosis: Principal Problem:   Chest pain Active Problems:   Tobacco abuse   Near syncope    Discharge Medications: Allergies as of 03/13/2022       Reactions   Iohexol Hives, Itching   Benadryl '50mg'$  PO one hour before iodinated contrast studies.  jkl        Medication List     STOP taking these medications    DULoxetine 60 MG capsule Commonly known as: CYMBALTA   metoprolol tartrate 25 MG tablet Commonly known as: LOPRESSOR   Olmesartan-amLODIPine-HCTZ 40-10-12.5 MG Tabs       TAKE these medications    albuterol 108 (90 Base) MCG/ACT inhaler Commonly known as: Proventil HFA Inhale 1-2 puffs into the lungs every 6 (six) hours as needed for wheezing or shortness of breath.   amLODipine 5 MG tablet Commonly known as: NORVASC Take 1 tablet (5 mg total) by mouth daily.   atorvastatin 40 MG tablet Commonly known as: LIPITOR Take 1 tablet (40 mg total) by mouth daily.   famotidine 10 MG tablet Commonly known as: PEPCID Take 1 tablet (10 mg total) by mouth 2 (two) times daily.   nicotine 21 mg/24hr patch Commonly known as: NICODERM CQ - dosed in mg/24 hours Place 1 patch (21 mg total) onto the skin daily.   nitroGLYCERIN 0.4 MG SL tablet Commonly known as: NITROSTAT Place 1 tablet (0.4 mg total) under the tongue every 5 (five) minutes as needed for chest pain.   pregabalin 50 MG capsule Commonly known as: Lyrica Take 1 capsule (50 mg total) by mouth 3 (three) times daily. Start taking it twice daily for 1 week and increase to three times daily after that. What changed: additional instructions        Disposition and follow-up:   Kathleen Stone was discharged from [facility] in Stable condition.  At the hospital follow up visit  please address:  1.  Follow-up:  a. Chest pain- ACS ruled out. Thinking likely GERD? Started famotidine    b. Microcytic hematuria- consider urethrogram as outpatient   c. Post-menopausal bleeding- follow-up with GYN   d. Pilocystic astrocytoma- follows with neurosurg, unresectable serial imaging. Due for 6 month repeat    E. Tobacco use- consider starting patient on naltrexone to help with smoking cessation  2.  Labs / imaging needed at time of follow-up: BMP, MRI head   3.  Pending labs/ test needing follow-up: none  4.  Medication Changes  Started: amlodipine 5 mg qd, famotidine 20 mg BID  Follow-up Appointments:  [facility] 7/11 9:15AM Hospital Course by problem list: Near Syncope Chest pain Patient presented with near syncope with associated midsternal chest pain.  Chest pain was reproducible with palpation on exam and she described as a burning sensation.  Troponin initially elevated to 19 and flat at 20.  EKG showed normal sinus rhythm with LVH with no significant changes.  Cardiology was consulted and do not think that chest pain was cardiac etiology.  Echo was completed and showed EF 65 to 70% with no regional wall motion abnormalities of the left ventricle.  Orthostatics were within normal limits.  Chest pain reproducible with palpation is episodic not related to exertion.  This pain could be from GERD and patient was started on famotidine at discharge.  Hypokalemia  Potassium at 2.8 on admission. She was given replacement and repeat lab showed 3.6.  Hypokalemia could be secondary to recent episode of vomiting versus decreased p.o. intake.  She was prescribed hydrochlorothiazide combination pill but reports nonadherence.  Microscopic hematuria Patient is postmenopausal, but reports abnormal uterine bleeding.  She states that she infrequently has spotting and is unsure when the last time that she bled.  Her hematuria has been persistent for 9 years.  She denies dysuria or urinary  frequency.  She does have history significant for smoking which places her at an increased risk of urethral carcinoma. She will need imaging for further evaluation as outpatient  Hypertension She is prescribed olmesartan-amlodipine-HCTZ 40-10-12.5 mg qd and metoprolol tartrate 25 mg BID. She is not adherent with medication. Blood pressure was elevated on presentation  Hyperlipidemia Total cholesterol greater 209 and LDL 106. Home medications include atorvastatin 40 mg qd.   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 She follows with Dr. Wende Mott of Neurosurgery for this. Current plan is for serial imaging of lesion as risks outweigh benefits for resection. She does have left sided tingling that has been attributed to this. She needs to follow-up with [facility] neurosurgery to repeat imaging.  COPD Tobacco use disorder She has never had PFTs and does not use inhalers at home. She currently smokes about 1.5 ppd for 20+ years. She is interested in quitting smoking.   Discharge Exam:   BP (!) 142/62   Pulse 69   Temp 97.6 F (36.4 C) (Oral)   Resp 16   Ht '5\' 6"'$  (1.676 m)   Wt 99.4 kg   LMP 02/07/2016 (Exact Date)   SpO2 98%   BMI 35.37 kg/m  Constitutional: well-appearing, laying in bed in no acute distress Cardiovascular: regular rate and rhythm, no m/r/g Pulmonary/Chest: normal work of breathing on room air, lungs clear to auscultation bilaterally Abdominal: soft, non-tender, non-distended MSK: no edema in lower extremities bilaterally, normal bulk and tone Neurological: alert and oriented x3 Skin: warm and dry Psych: normal mood and affect  Pertinent Labs, Studies, and Procedures:     Latest Ref Rng & Units 03/13/2022    6:05 AM 03/12/2022    4:40 PM 11/16/2021   12:45 AM  CBC  WBC 4.0 - 10.5 K/uL 7.9  10.3  16.6   Hemoglobin 12.0 - 15.0 g/dL 13.5  15.9  15.6   Hematocrit 36.0 - 46.0 % 41.7  47.1  47.9   Platelets 150 - 400 K/uL 192  217  192        Latest Ref Rng & Units  03/13/2022    6:05 AM 03/12/2022    4:40 PM 11/16/2021   12:45 AM  CMP  Glucose 70 - 99 mg/dL 138  107  87   BUN 6 - 20 mg/dL '6  5  13   '$ Creatinine 0.44 - 1.00 mg/dL 0.88  0.95  1.06   Sodium 135 - 145 mmol/L 142  142  141   Potassium 3.5 - 5.1 mmol/L 3.6  2.8  4.0   Chloride 98 - 111 mmol/L 108  107  104   CO2 22 - 32 mmol/L '27  24  25   '$ Calcium 8.9 - 10.3 mg/dL 9.0  9.6  10.3   Total Protein 6.5 - 8.1 g/dL  7.0  7.7   Total Bilirubin 0.3 - 1.2 mg/dL  1.5  1.0   Alkaline Phos 38 - 126 U/L  77  96   AST 15 -  41 U/L  23  25   ALT 0 - 44 U/L  23  21     ECHOCARDIOGRAM COMPLETE  Result Date: 03/13/2022    ECHOCARDIOGRAM REPORT   Patient Name:   MARINE LEZOTTE Date of Exam: 03/13/2022 Medical Rec #:  [id number]        Height:       66.0 in Accession #:    [id number]       Weight:       219.1 lb Date of Birth:  05/01/1966        BSA:          2.079 m Patient Age:    65 years         BP:           144/65 mmHg Patient Gender: F                HR:           54 bpm. Exam Location:  Inpatient Procedure: 2D Echo Indications:    chest pain  History:        Patient has prior history of Echocardiogram examinations, most                 recent 11/16/2016. COPD, Signs/Symptoms:Dyspnea; Risk                 Factors:Hypertension, Dyslipidemia and Current Smoker.  Sonographer:    Johny Chess RDCS Referring Phys: [id number] [place]  1. Left ventricular ejection fraction, by estimation, is 65 to 70%. The left ventricle has normal function. The left ventricle has no regional wall motion abnormalities. There is mild concentric left ventricular hypertrophy. Left ventricular diastolic parameters are consistent with Grade II diastolic dysfunction (pseudonormalization).  2. Right ventricular systolic function is normal. The right ventricular size is normal. Tricuspid regurgitation signal is inadequate for assessing PA pressure.  3. Left atrial size was moderately dilated.  4. The mitral valve is normal  in structure. Mild mitral valve regurgitation. No evidence of mitral stenosis.  5. The aortic valve is normal in structure. Aortic valve regurgitation is not visualized. No aortic stenosis is present.  6. The inferior vena cava is normal in size with greater than 50% respiratory variability, suggesting right atrial pressure of 3 mmHg. Comparison(s): Prior images unable to be directly viewed, comparison made by report only. FINDINGS  Left Ventricle: Left ventricular ejection fraction, by estimation, is 65 to 70%. The left ventricle has normal function. The left ventricle has no regional wall motion abnormalities. The left ventricular internal cavity size was normal in size. There is  mild concentric left ventricular hypertrophy. Left ventricular diastolic parameters are consistent with Grade II diastolic dysfunction (pseudonormalization). Right Ventricle: The right ventricular size is normal. No increase in right ventricular wall thickness. Right ventricular systolic function is normal. Tricuspid regurgitation signal is inadequate for assessing PA pressure. Left Atrium: Left atrial size was moderately dilated. Right Atrium: Right atrial size was normal in size. Pericardium: Trivial pericardial effusion is present. The pericardial effusion is circumferential. Mitral Valve: The mitral valve is normal in structure. Mild mitral valve regurgitation, with centrally-directed jet. No evidence of mitral valve stenosis. Tricuspid Valve: The tricuspid valve is normal in structure. Tricuspid valve regurgitation is not demonstrated. No evidence of tricuspid stenosis. Aortic Valve: The aortic valve is normal in structure. Aortic valve regurgitation is not visualized. No aortic stenosis is present. Pulmonic Valve: The pulmonic valve was normal in structure. Pulmonic valve regurgitation is  not visualized. No evidence of pulmonic stenosis. Aorta: The aortic root is normal in size and structure. Venous: The inferior vena cava is  normal in size with greater than 50% respiratory variability, suggesting right atrial pressure of 3 mmHg. IAS/Shunts: No atrial level shunt detected by color flow Doppler.  LEFT VENTRICLE PLAX 2D LVIDd:         4.00 cm   Diastology LVIDs:         2.50 cm   LV e' medial:    4.68 cm/s LV PW:         1.20 cm   LV E/e' medial:  14.5 LV IVS:        1.30 cm   LV e' lateral:   5.00 cm/s LVOT diam:     1.99 cm   LV E/e' lateral: 13.6 LV SV:         88 LV SV Index:   42 LVOT Area:     3.11 cm  RIGHT VENTRICLE             IVC RV S prime:     13.30 cm/s  IVC diam: 2.10 cm TAPSE (M-mode): 2.2 cm LEFT ATRIUM             Index        RIGHT ATRIUM           Index LA diam:        4.20 cm 2.02 cm/m   RA Area:     13.90 cm LA Vol (A2C):   78.4 ml 37.71 ml/m  RA Volume:   33.20 ml  15.97 ml/m LA Vol (A4C):   87.3 ml 41.99 ml/m LA Biplane Vol: 85.2 ml 40.98 ml/m  AORTIC VALVE LVOT Vmax:   111.00 cm/s LVOT Vmean:  72.000 cm/s LVOT VTI:    0.283 m  AORTA Ao Root diam: 2.80 cm Ao Asc diam:  2.60 cm MITRAL VALVE MV Area (PHT): 2.11 cm      SHUNTS MV Decel Time: 359 msec      Systemic VTI:  0.28 m MV E velocity: 68.00 cm/s    Systemic Diam: 1.99 cm MV A velocity: 6000.00 cm/s MV E/A ratio:  0.01 Mihai Croitoru MD Electronically signed by Sanda Klein MD Signature Date/Time: 03/13/2022/9:56:22 AM    Final    CT Head Wo Contrast  Result Date: 03/12/2022 CLINICAL DATA:  Brain/CNS neoplasm. Monitor. Known pilocytic astrocytoma. EXAM: CT HEAD WITHOUT CONTRAST TECHNIQUE: Contiguous axial images were obtained from the base of the skull through the vertex without intravenous contrast. RADIATION DOSE REDUCTION: This exam was performed according to the departmental dose-optimization program which includes automated exposure control, adjustment of the mA and/or kV according to patient size and/or use of iterative reconstruction technique. COMPARISON:  02/24/2020 FINDINGS: Brain: Redemonstrated is a slightly hyperdense fairly homogeneous  mass arising from the right dorsal midbrain. On today's study, this measures 19 x 15 x 13 mm. This appears to have enlarged slightly since the studies of June 2021. This is not associated with any brain edema. This is not causing ventricular obstruction. 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 No other brain finding. No extra-axial fluid collection. Vascular: No abnormal vascular finding. Skull: No other skull finding. 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 I think this has enlarged a few mm when compared to the  prior exams. There is no associated edema. This is not causing ventricular obstruction. 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 Electronically Signed   By: Nelson Chimes M.D.   On: 03/12/2022 18:09   DG Chest 1 View  Result Date: 03/12/2022 CLINICAL DATA:  Shortness of breath dizziness EXAM: CHEST  1 VIEW COMPARISON:  02/24/2020 FINDINGS: The heart size and mediastinal contours are within normal limits. Both lungs are clear. The visualized skeletal structures are unremarkable. IMPRESSION: No active disease. Electronically Signed   By: Donavan Foil M.D.   On: 03/12/2022 16:23     Discharge Instructions: Discharge Instructions     Diet - low sodium heart healthy   Complete by: As directed    Discharge instructions   Complete by: As directed    Ms. Turay,  You were recently admitted to [facility] for near syncope and chest pain. Cardiology saw you and do not think that chest pain is from your heart. Ultrasound of your heart looked good. This could be related to acid reflux. .   Continue taking your home medications with the following changes  Start taking  1. famotidine, 2 times daily for gastric reflux 2. Amlodipine 5 mg, 1 time  daily  You should seek further medical care if you have chest pain or nausea/ vomiting with exercise.  Follow-up with Internal Medicine Clinic on 7/11 at 9:15AM.  Sincerely, Christiana Fuchs, DO   Increase activity slowly   Complete by: As directed        Signed: Christiana Fuchs, DO 03/13/2022, 3:56 PM   Pager: [phone]

## 2022-03-13 NOTE — TOC Transition Note (Signed)
Transition of Care [facility]) - CM/SW Discharge Note   Patient Details  Name: Kathleen Stone MRN: [id number] Date of Birth: 07/26/1966  [facility] CM/SW Contact:  Leone Haven, RN Phone Number: 03/13/2022, 4:07 PM   Clinical Narrative:    Patient is for dc , has no needs.         Patient Goals and CMS Choice        Discharge Placement                       Discharge Plan and Services                                     Social Determinants of Health (SDOH) Interventions     Readmission Risk Interventions     No data to display

## 2022-03-15 NOTE — Telephone Encounter (Signed)
Lyrica was refilled 02/21/22 and sent to [facility] pharmacy.

## 2022-03-27 ENCOUNTER — Encounter: Payer: Commercial Managed Care - HMO | Admitting: Internal Medicine

## 2022-03-27 NOTE — Progress Notes (Deleted)
HFU Kathleen Stone is a 19 with past medical history of pilocytic astrocytoma, depression, post-menopausal bleeding who presented with for near syncope with associated midsternal chest pain. Cardiology consulted and chest pain not consistent with ACS.  CP/ GERD Has famotidine helped?  Hypokalemia Repeat BMP  Abnormal uterine bleeding in postmenopausal Referral to GYN  Microscopic hematuria Repeat UA  Smoking cessation  MDD  HTN

## 2022-04-05 ENCOUNTER — Other Ambulatory Visit: Payer: Self-pay

## 2022-04-05 ENCOUNTER — Encounter: Payer: Self-pay | Admitting: Internal Medicine

## 2022-04-05 ENCOUNTER — Ambulatory Visit (INDEPENDENT_AMBULATORY_CARE_PROVIDER_SITE_OTHER): Payer: Commercial Managed Care - HMO | Admitting: Internal Medicine

## 2022-04-05 VITALS — BP 137/63 | HR 92 | Temp 98.0°F | Ht 66.0 in | Wt 220.4 lb

## 2022-04-05 DIAGNOSIS — E876 Hypokalemia: Secondary | ICD-10-CM

## 2022-04-05 DIAGNOSIS — R3129 Other microscopic hematuria: Secondary | ICD-10-CM | POA: Diagnosis not present

## 2022-04-05 DIAGNOSIS — N950 Postmenopausal bleeding: Secondary | ICD-10-CM

## 2022-04-05 DIAGNOSIS — C719 Malignant neoplasm of brain, unspecified: Secondary | ICD-10-CM

## 2022-04-05 DIAGNOSIS — R072 Precordial pain: Secondary | ICD-10-CM

## 2022-04-05 DIAGNOSIS — I208 Other forms of angina pectoris: Secondary | ICD-10-CM

## 2022-04-05 DIAGNOSIS — F17210 Nicotine dependence, cigarettes, uncomplicated: Secondary | ICD-10-CM

## 2022-04-05 NOTE — Progress Notes (Unsigned)
Subjective:  CC: episode of chest pain, hospital follow-up  HPI:  Kathleen Stone is a 56 y.o. female with a past medical history stated below and presents today for hospital follow-up for admission where she had atypical chest pain with presyncopal symptoms ruled out by cardiology to not be from cardiac etiology.  Since then patient had an episode of chest pain on 7/19 in which she felt like she was going to pass out and she had central chest pain while working out in the sun.  She states that she presented to an emergency room in [place] where she was working and left prior to them telling her what her EKG and lab results were. Please see problem based assessment and plan for additional details.  Past Medical History:  Diagnosis Date   Brain tumor (benign) (HCC)    Chronic lower back pain    Negative MRI of hips bilaterally, neg CT scan of A and P; MRI of T and L spine-> very mild deg changes from T4-5 and T7-8, cervical spondylotic changes at C5-6, very mild disc bulge at L2-3 without spinal stenosis, nl ESR, ANA, RF, HIV, TSH, and B12   DJD (degenerative joint disease) of cervical spine    Family history of adverse reaction to anesthesia    "sisters PONV"   Galactorrhea of both breasts 2009   R. sided w/ benign papilloma excised in 4/09. L sided in 5/09   Gallstones    Hyperlipidemia    Hypertension    Migraine    "monthly" (11/15/2016)   Neuromuscular disorder (HCC)    left arm and side of face    Neuropathy    Thought to be 2/2 nerve compression in the C-spine   Obesity    Personal history of adenomatous colonic polyp    09/2007 - diminutive adenoma ([facility])   PONV (postoperative nausea and vomiting)    Stroke ([facility])    2021- brain tumor bx caused ? stroke-    Tobacco abuse     Current Outpatient Medications on File Prior to Visit  Medication Sig Dispense Refill   albuterol (PROVENTIL HFA) 108 (90 Base) MCG/ACT inhaler Inhale 1-2 puffs into the lungs every 6  (six) hours as needed for wheezing or shortness of breath. 8 g 3   atorvastatin (LIPITOR) 40 MG tablet Take 1 tablet (40 mg total) by mouth daily. (Patient not taking: Reported on 03/12/2022) 90 tablet 3   famotidine (PEPCID) 10 MG tablet Take 1 tablet (10 mg total) by mouth 2 (two) times daily. 60 tablet 0   nicotine (NICODERM CQ - DOSED IN MG/24 HOURS) 21 mg/24hr patch Place 1 patch (21 mg total) onto the skin daily. 30 patch 1   nitroGLYCERIN (NITROSTAT) 0.4 MG SL tablet Place 1 tablet (0.4 mg total) under the tongue every 5 (five) minutes as needed for chest pain. 25 tablet 1   pregabalin (LYRICA) 50 MG capsule Take 1 capsule (50 mg total) by mouth 3 (three) times daily. Start taking it twice daily for 1 week and increase to three times daily after that. (Patient taking differently: Take 50 mg by mouth 3 (three) times daily.) 90 capsule 2   No current facility-administered medications on file prior to visit.    Family History  Problem Relation Age of Onset   Heart failure Mother    Diabetes Mother    Breast cancer Other        aunt   Liver cancer Other  aunt   Lung cancer Father    Colon cancer Neg Hx    Colon polyps Neg Hx    Esophageal cancer Neg Hx    Rectal cancer Neg Hx    Stomach cancer Neg Hx     Social History   Socioeconomic History   Marital status: Married    Spouse name: Not on file   Number of children: Not on file   Years of education: Not on file   Highest education level: Not on file  Occupational History   Not on file  Tobacco Use   Smoking status: Every Day    Packs/day: 1.50    Years: 31.00    Total pack years: 46.50    Types: Cigarettes   Smokeless tobacco: Never  Vaping Use   Vaping Use: Former  Substance and Sexual Activity   Alcohol use: No    Alcohol/week: 0.0 standard drinks of alcohol   Drug use: No   Sexual activity: Yes    Partners: Female  Other Topics Concern   Not on file  Social History Narrative   Domestic Partner: female  has emphysema   Smoked 2 packs/day for 20+ years, down to 1/2ppd now, willing to try to quit    Alcohol use-no   Drug use-no   Regular exercise-no   Social Determinants of Health   Financial Resource Strain: Not on file  Food Insecurity: Not on file  Transportation Needs: Not on file  Physical Activity: Not on file  Stress: Not on file  Social Connections: Not on file  Intimate Partner Violence: Not on file    Review of Systems: ROS negative except for what is noted on the assessment and plan.  Objective:   Vitals:   04/05/22 1557  BP: 137/63  Pulse: 92  Temp: 98 F (36.7 C)  TempSrc: Oral  SpO2: 99%  Weight: 220 lb 6.4 oz (100 kg)  Height: _0  (1.676 m)    Physical Exam: Constitutional: well-appearing , in no acute distress Cardiovascular: regular rate and rhythm, no m/r/g Pulmonary/Chest: normal work of breathing on room air, lungs clear to auscultation bilaterally Abdominal: soft, non-tender, non-distended MSK: normal bulk and tone Neurological: alert & oriented x 3 Skin: warm and dry  Assessment & Plan:  Chest pain Patient presents for hospital follow-up in which she had presyncopal symptoms and atypical chest pain.  During that episode she was laying in bed and had sudden onset chest pain that was reproducible with palpation.  She was found to have mildly elevated troponins to 20 that were then flat with no EKG changes.  Cardiology was consulted and did not think that pain was cardiac related.  She was sent home with famotidine as episode sounded more consistent with acid reflux.  Since then she had an episode of chest pain on 7/19 when she was working outside in the heat.  She said she was doing physical exertion and began to have central chest pain that did not radiate.  The pain felt like something was pushing on her chest.  She went to the emergency department in [place] where she was working and received blood work but did not stay to review labs with  provider she is not currently having chest pain and reports that the pain resolved yesterday on its own.  Patient states that she has not been taking famotidine since going home from the hospital.  On physical exam she does have tenderness over chest wall, no murmurs rubs or gallops  regular rhythm. A/P: Differentials include unstable angina versus costochondritis vs acid reflux.  Her episode in which she had chest pain with exertion is more concerning for cardiac cause.  With patient's history of hypertension and hyperlipidemia will refer to cardiology for stress testing.  Post-menopausal bleeding Patient with history of menopause about 5 to 6 years ago.  She states that for the past year she has had bleeding from her vagina.  She reports that this started about a year ago after penetrative intercourse.  She has not continued to use the same object since then.  She thought that the bleeding was related to the toy that was used however the bleeding has continued.  She has lost about 50 pounds since August 2022.  Hemoglobin was stable during recent admission.  She was referred to gynecology in August 2022 when she first reported symptoms but did not follow-up with them. A/P: Her presentation is concerning for possible endometrial cancer.  Will refer to gynecology at this time.  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  I will touch base with front desk to see about scheduling MRI.  I asked patient to please follow-up with neurosurgery as soon as possible.  Hematuria, microscopic Patient presenting with persistently positive microscopic hematuria.  She denies dysuria or urinary frequency.  She does currently use tobacco products. A/P: Differentials include urinary tract infection versus malignancy.  It is unlikely that she has had a urinary tract infection for this prolonged amount of time and she is asymptomatic with no dysuria or increased  frequency.  Her risk of malignancy is higher in the setting of her tobacco use.  She has had a 50 pound weight loss since August 2022.  We will refer to urology at this time.  Hypokalemia And recent hospitalization.  Patient was hypokalemic to 2.8.  She did not have recent history of diarrhea.  Hydrochlorothiazide was listed on her med list but she had not been taking this medication for some time.  She was given p.o. potassium and potassium improved prior to discharge.  Repeat BMP in office shows potassium of 3.1.  Bicarb is within normal limits. A/P: Patient with asymptomatic mild hypokalemia.  Differentials include potassium wasting from urinary tract. Unsure if patient would be able to complete at 24 hours urine collection. Will call patient to have her come back in for lab visit for potassium to creatinine ratio. If ratio is abnormal this could be due to aldosteone    Patient discussed with Dr. Brent General Dorismar Chay, D.O. [facility]  PGY-2 Pager: [phone]  Phone: [phone] Date 04/07/2022  Time 7:21 AM

## 2022-04-05 NOTE — Patient Instructions (Signed)
Thank you, [name] for allowing [place] to provide your care today. Today we discussed:  Chest pain The episode that you had yesterday is more concerning for your heart being the cause of your chest pain.  I am referring you to cardiology for a stress test to further evaluate this.  Low electrolytes I am rechecking your electrolytes.  When you are in the hospital your potassium was low.  Post menopausal bleeding I am concerned about your bleeding after going through menopause.  You have also had significant weight loss in the last year.  I have referred you to gynecology.  Blood in your urine I am referring you to urology for further evaluation of your persistent hematuria or blood in your urine.  Astrocytoma You need to follow-up with your neurosurgeons.  A repeat MRI is needed.  I will touch base with our front desk to see if we can help with getting that scheduled.  I have ordered the following labs for you:   Lab Orders         BMP8+Anion Gap      Referrals ordered today:    Referral Orders         Ambulatory referral to Cardiology         Ambulatory referral to Gynecology         Ambulatory referral to Urology      I have ordered the following medication/changed the following medications:   Stop the following medications: Medications Discontinued During This Encounter  Medication Reason   amLODipine (NORVASC) 5 MG tablet Completed Course     Start the following medications: No orders of the defined types were placed in this encounter.    Follow up:  1 month    Remember: please follow-up with neurosurgery  We look forward to seeing you next time. Please call our clinic at [phone] if you have any questions or concerns. The best time to call is Monday-Friday from 9am-4pm, but there is someone available 24/7. If after hours or the weekend, call the main hospital number and ask for the Internal Medicine Resident On-Call. If you need medication refills,  please notify your pharmacy one week in advance and they will send [place] a request.   Thank you for trusting me with your care. Wishing you the best!   Christiana Fuchs, [place]

## 2022-04-06 LAB — BMP8+ANION GAP
Anion Gap: 14 mmol/L (ref 10.0–18.0)
BUN/Creatinine Ratio: 21 (ref 9–23)
BUN: 15 mg/dL (ref 6–24)
CO2: 24 mmol/L (ref 20–29)
Calcium: 9.8 mg/dL (ref 8.7–10.2)
Chloride: 104 mmol/L (ref 96–106)
Creatinine, Ser: 0.72 mg/dL (ref 0.57–1.00)
Glucose: 130 mg/dL — ABNORMAL HIGH (ref 70–99)
Potassium: 3.1 mmol/L — ABNORMAL LOW (ref 3.5–5.2)
Sodium: 142 mmol/L (ref 134–144)
eGFR: 97 mL/min/{1.73_m2} (ref >59)

## 2022-04-07 ENCOUNTER — Ambulatory Visit (HOSPITAL_COMMUNITY): Payer: Commercial Managed Care - HMO

## 2022-04-07 DIAGNOSIS — E876 Hypokalemia: Secondary | ICD-10-CM | POA: Insufficient documentation

## 2022-04-07 NOTE — Assessment & Plan Note (Signed)
Patient is overdue for repeat MRI and to follow-up with neurosurgery.  I will touch base with front desk to see about scheduling MRI.  I asked patient to please follow-up with neurosurgery as soon as possible.

## 2022-04-07 NOTE — Assessment & Plan Note (Addendum)
Patient presenting with persistently positive microscopic hematuria.  She denies dysuria or urinary frequency.  She does currently use tobacco products. A/P: Differentials include urinary tract infection versus malignancy.  It is unlikely that she has had a urinary tract infection for this prolonged amount of time and she is asymptomatic with no dysuria or increased frequency.  Her risk of malignancy is higher in the setting of her tobacco use.  She has had a 50 pound weight loss since August 2022.  We will refer to urology at this time.

## 2022-04-07 NOTE — Assessment & Plan Note (Signed)
Patient with history of menopause about 5 to 6 years ago.  She states that for the past year she has had bleeding from her vagina.  She reports that this started about a year ago after penetrative intercourse.  She has not continued to use the same object since then.  She thought that the bleeding was related to the toy that was used however the bleeding has continued.  She has lost about 50 pounds since August 2022.  Hemoglobin was stable during recent admission.  She was referred to gynecology in August 2022 when she first reported symptoms but did not follow-up with them. A/P: Her presentation is concerning for possible endometrial cancer.  Will refer to gynecology at this time.

## 2022-04-07 NOTE — Assessment & Plan Note (Signed)
Patient presents for hospital follow-up in which she had presyncopal symptoms and atypical chest pain.  During that episode she was laying in bed and had sudden onset chest pain that was reproducible with palpation.  She was found to have mildly elevated troponins to 20 that were then flat with no EKG changes.  Cardiology was consulted and did not think that pain was cardiac related.  She was sent home with famotidine as episode sounded more consistent with acid reflux.  Since then she had an episode of chest pain on 7/19 when she was working outside in the heat.  She said she was doing physical exertion and began to have central chest pain that did not radiate.  The pain felt like something was pushing on her chest.  She went to the emergency department in [place] where she was working and received blood work but did not stay to review labs with provider she is not currently having chest pain and reports that the pain resolved yesterday on its own.  Patient states that she has not been taking famotidine since going home from the hospital.  On physical exam she does have tenderness over chest wall, no murmurs rubs or gallops regular rhythm. A/P: Differentials include unstable angina versus costochondritis vs acid reflux.  Her episode in which she had chest pain with exertion is more concerning for cardiac cause.  With patient's history of hypertension and hyperlipidemia will refer to cardiology for stress testing.

## 2022-04-07 NOTE — Assessment & Plan Note (Signed)
And recent hospitalization.  Patient was hypokalemic to 2.8.  She did not have recent history of diarrhea.  Hydrochlorothiazide was listed on her med list but she had not been taking this medication for some time.  She was given p.o. potassium and potassium improved prior to discharge.  Repeat BMP in office shows potassium of 3.1.  Bicarb is within normal limits. A/P: Patient with asymptomatic mild hypokalemia.  Differentials include potassium wasting from urinary tract. Unsure if patient would be able to complete at 24 hours urine collection. Will call patient to have her come back in for lab visit for potassium to creatinine ratio. If ratio is abnormal this could be due to [employer]

## 2022-04-09 ENCOUNTER — Telehealth: Payer: Self-pay

## 2022-04-09 ENCOUNTER — Encounter: Payer: Self-pay | Admitting: Internal Medicine

## 2022-04-09 NOTE — Telephone Encounter (Signed)
Requesting a note for work. Please call pt back. Pt would like the note to be faxed to [phone], attention: Adonis Housekeeper.

## 2022-04-09 NOTE — Progress Notes (Signed)
Internal Medicine Clinic Attending  Case discussed with Dr. Masters  At the time of the visit.  We reviewed the resident's history and exam and pertinent patient test results.  I agree with the assessment, diagnosis, and plan of care documented in the resident's note.  

## 2022-04-13 ENCOUNTER — Encounter: Payer: Self-pay | Admitting: Cardiology

## 2022-04-13 ENCOUNTER — Ambulatory Visit: Payer: Commercial Managed Care - HMO | Admitting: Cardiology

## 2022-04-13 VITALS — BP 136/50 | HR 77 | Ht 66.0 in | Wt 217.6 lb

## 2022-04-13 DIAGNOSIS — R072 Precordial pain: Secondary | ICD-10-CM | POA: Diagnosis not present

## 2022-04-13 DIAGNOSIS — Z720 Tobacco use: Secondary | ICD-10-CM | POA: Diagnosis not present

## 2022-04-13 MED ORDER — PREDNISONE 50 MG PO TABS: ORAL_TABLET | ORAL | 0 refills | Status: DC

## 2022-04-13 MED ORDER — METOPROLOL TARTRATE 100 MG PO TABS: 100.0000 mg | ORAL_TABLET | ORAL | 0 refills | Status: DC

## 2022-04-13 NOTE — Progress Notes (Signed)
Cardiology Office Note:    Date:  04/13/2022   ID:  Kathleen Stone, DOB 05/30/1966, MRN [id number]  PCP:  Masters, Katie, DO   [facility] Providers Cardiologist:  None     Referring MD: Narendra, Nischal, MD    History of Present Illness:    Kathleen Stone is a 56 y.o. female here for evaluation of recurrent chest discomfort.  At the request of Dr. Masters.  Previously she was admitted with chest pain with EKG that showed slight ST elevation in 2018.  Echo showed EF of 65 to 70%.  Outpatient nuclear stress test showed a medium defect of mild severity in the basal anterior mid anterior and apical anterior wall which was reversible but thought to be because of breast attenuation.  Chest pain at times can feel fairly constant.  Mother CHF  Tobacco use  Previously had a benign brain tumor, remembers having an angiogram performed, was in the hospital for several days with stroke.  She is worried about potentially having a cardiac catheterization.  Past Medical History:  Diagnosis Date   Brain tumor (benign) (HCC)    Chronic lower back pain    Negative MRI of hips bilaterally, neg CT scan of A and P; MRI of T and L spine-> very mild deg changes from T4-5 and T7-8, cervical spondylotic changes at C5-6, very mild disc bulge at L2-3 without spinal stenosis, nl ESR, ANA, RF, HIV, TSH, and B12   DJD (degenerative joint disease) of cervical spine    Family history of adverse reaction to anesthesia    "sisters PONV"   Galactorrhea of both breasts 2009   R. sided w/ benign papilloma excised in 4/09. L sided in 5/09   Gallstones    Hyperlipidemia    Hypertension    Migraine    "monthly" (11/15/2016)   Neuromuscular disorder (HCC)    left arm and side of face    Neuropathy    Thought to be 2/2 nerve compression in the C-spine   Obesity    Personal history of adenomatous colonic polyp    09/2007 - diminutive adenoma (Gessner)   PONV (postoperative nausea and vomiting)    Stroke  (HCC)    2021- brain tumor bx caused ? stroke-    Tobacco abuse     Past Surgical History:  Procedure Laterality Date   APPENDECTOMY     BRAIN BIOPSY     [facility]- 03-2020   BREAST SURGERY Right 12/2007   central duct excision   CHOLECYSTECTOMY N/A 12/08/2019   Procedure: LAPAROSCOPIC CHOLECYSTECTOMY;  Surgeon: Ramirez, Armando, MD;  Location: [facility] OR;  Service: General;  Laterality: N/A;   COLONOSCOPY  2009   COLONOSCOPY  09/14/2020   COLONOSCOPY W/ BIOPSIES AND POLYPECTOMY  09/2007   Dr. Gessner. 2 small adenomatous polyps, internal hemorrhoids-Grade 1. Rec  routine colonoscopy at age 49.   COLONOSCOPY W/ BIOPSIES AND POLYPECTOMY     NASAL SEPTOPLASTY W/ TURBINOPLASTY Bilateral 04/2008   Septoplasty with bilateral inferior turbinate reduction; Dr. Newman   SHOULDER OPEN ROTATOR CUFF REPAIR Right     after hurting it by moving heavy objecs    Current Medications: Current Meds  Medication Sig   albuterol (PROVENTIL HFA) 108 (90 Base) MCG/ACT inhaler Inhale 1-2 puffs into the lungs every 6 (six) hours as needed for wheezing or shortness of breath.   atorvastatin (LIPITOR) 40 MG tablet Take 1 tablet (40 mg total) by mouth daily.   famotidine (PEPCID) 10 MG tablet Take   1 tablet (10 mg total) by mouth 2 (two) times daily.   metoprolol tartrate (LOPRESSOR) 100 MG tablet Take 1 tablet (100 mg total) by mouth as directed. Take 1 tablet 2 hours before your CT scan   nicotine (NICODERM CQ - DOSED IN MG/24 HOURS) 21 mg/24hr patch Place 1 patch (21 mg total) onto the skin daily.   nitroGLYCERIN (NITROSTAT) 0.4 MG SL tablet Place 1 tablet (0.4 mg total) under the tongue every 5 (five) minutes as needed for chest pain.   predniSONE (DELTASONE) 50 MG tablet Take 1 tab 13 hrs before, 1 tab 7 hr before and 1 tab 1 hr before your CT scan   pregabalin (LYRICA) 50 MG capsule Take 1 capsule (50 mg total) by mouth 3 (three) times daily. Start taking it twice daily for 1 week and increase to three times daily  after that.   [DISCONTINUED] metoprolol tartrate (LOPRESSOR) 100 MG tablet Take 1 tablet (100 mg total) by mouth as directed. Take 1 tablet 2 hours before your CT scan     Allergies:   Iohexol   Social History   Socioeconomic History   Marital status: Married    Spouse name: Not on file   Number of children: Not on file   Years of education: Not on file   Highest education level: Not on file  Occupational History   Not on file  Tobacco Use   Smoking status: Every Day    Packs/day: 1.50    Years: 31.00    Total pack years: 46.50    Types: Cigarettes   Smokeless tobacco: Never  Vaping Use   Vaping Use: Former  Substance and Sexual Activity   Alcohol use: No    Alcohol/week: 0.0 standard drinks of alcohol   Drug use: No   Sexual activity: Yes    Partners: Female  Other Topics Concern   Not on file  Social History Narrative   Domestic Partner: female has emphysema   Smoked 2 packs/day for 20+ years, down to 1/2ppd now, willing to try to quit    Alcohol use-no   Drug use-no   Regular exercise-no   Social Determinants of Health   Financial Resource Strain: Not on file  Food Insecurity: Not on file  Transportation Needs: Not on file  Physical Activity: Not on file  Stress: Not on file  Social Connections: Not on file     Family History: The patient's family history includes Breast cancer in an other family member; Diabetes in her mother; Heart failure in her mother; Liver cancer in an other family member; Lung cancer in her father. There is no history of Colon cancer, Colon polyps, Esophageal cancer, Rectal cancer, or Stomach cancer.  ROS:   Please see the history of present illness.     All other systems reviewed and are negative.  EKGs/Labs/Other Studies Reviewed:    The following studies were reviewed today: ECHO 03/13/22:    1. Left ventricular ejection fraction, by estimation, is 65 to 70%. The  left ventricle has normal function. The left ventricle has no  regional  wall motion abnormalities. There is mild concentric left ventricular  hypertrophy. Left ventricular diastolic  parameters are consistent with Grade II diastolic dysfunction  (pseudonormalization).   2. Right ventricular systolic function is normal. The right ventricular  size is normal. Tricuspid regurgitation signal is inadequate for assessing  PA pressure.   3. Left atrial size was moderately dilated.   4. The mitral valve is normal in   structure. Mild mitral valve  regurgitation. No evidence of mitral stenosis.   5. The aortic valve is normal in structure. Aortic valve regurgitation is  not visualized. No aortic stenosis is present.   6. The inferior vena cava is normal in size with greater than 50%  respiratory variability, suggesting right atrial pressure of 3 mmHg.   EKG: Sinus rhythm, PVC, nonspecific ST-T wave changes LVH repolarization Recent Labs: 11/16/2021: TSH 2.164 03/12/2022: ALT 23; Magnesium 2.0 03/13/2022: Hemoglobin 13.5; Platelets 192 04/05/2022: BUN 15; Creatinine, Ser 0.72; Potassium 3.1; Sodium 142  Recent Lipid Panel    Component Value Date/Time   CHOL 174 11/16/2021 0045   CHOL 209 (H) 06/13/2020 1549   TRIG 149 11/16/2021 0045   HDL 38 (L) 11/16/2021 0045   HDL 34 (L) 06/13/2020 1549   CHOLHDL 4.6 11/16/2021 0045   VLDL 30 11/16/2021 0045   LDLCALC 106 (H) 11/16/2021 0045   LDLCALC 124 (H) 06/13/2020 1549     Risk Assessment/Calculations:              Physical Exam:    VS:  BP (!) 136/50   Pulse 77   Ht 5' 6" (1.676 m)   Wt 217 lb 9.6 oz (98.7 kg)   LMP 02/07/2016 (Exact Date)   SpO2 98%   BMI 35.12 kg/m     Wt Readings from Last 3 Encounters:  04/13/22 217 lb 9.6 oz (98.7 kg)  04/05/22 220 lb 6.4 oz (100 kg)  03/13/22 219 lb 2.2 oz (99.4 kg)     GEN:  Well nourished, well developed in no acute distress HEENT: Normal NECK: No JVD; No carotid bruits LYMPHATICS: No lymphadenopathy CARDIAC: RRR, no murmurs, no rubs,  gallops RESPIRATORY:  Clear to auscultation without rales, wheezing or rhonchi  ABDOMEN: Soft, non-tender, non-distended MUSCULOSKELETAL:  No edema; No deformity  SKIN: Warm and dry NEUROLOGIC:  Alert and oriented x 3 PSYCHIATRIC:  Normal affect   ASSESSMENT:    1. Precordial pain   2. Tobacco abuse    PLAN:    In order of problems listed above:  Precordial chest pain Abnormal EKG Recurrent chest pain - We will check a coronary CT scan with possible FFR analysis.  Previously she had a nuclear stress test that was overall low risk in 2018.  I would like to get a more definitive anatomic study.  We discussed potentially cardiac catheterization however she has had an angiogram of her brain previously and unfortunately had a stroke following and was in the hospital for several days. -Metoprolol 100 mg prior. -Benadryl 50 mg prior.  Has had hives before with contrast.  Tobacco use - Continue encourage tobacco cessation.  She does have patches at home.  She states that her girlfriend does not like her to smoke around her.  GERD - Pepcid        Medication Adjustments/Labs and Tests Ordered: Current medicines are reviewed at length with the patient today.  Concerns regarding medicines are outlined above.  Orders Placed This Encounter  Procedures   CT CORONARY MORPH W/CTA COR W/SCORE W/CA W/CM &/OR WO/CM   Meds ordered this encounter  Medications   DISCONTD: metoprolol tartrate (LOPRESSOR) 100 MG tablet    Sig: Take 1 tablet (100 mg total) by mouth as directed. Take 1 tablet 2 hours before your CT scan    Dispense:  1 tablet    Refill:  0   metoprolol tartrate (LOPRESSOR) 100 MG tablet    Sig: Take 1 tablet (  100 mg total) by mouth as directed. Take 1 tablet 2 hours before your CT scan    Dispense:  1 tablet    Refill:  0   predniSONE (DELTASONE) 50 MG tablet    Sig: Take 1 tab 13 hrs before, 1 tab 7 hr before and 1 tab 1 hr before your CT scan    Dispense:  3 tablet     Refill:  0    Patient Instructions  Medication Instructions:  The current medical regimen is effective;  continue present plan and medications.  *If you need a refill on your cardiac medications before your next appointment, please call your pharmacy*   Testing/Procedures:   Your cardiac CT will be scheduled at:   [facility] [address] [place], [place] [address] [phone]  Your cardiac CT will be scheduled at:  Please arrive at the Women's and Children's Entrance (Entrance C2) of [facility] 30 minutes prior to test start time. You can use the FREE valet parking offered at entrance C (encouraged to control the heart rate for the test)  Proceed to the [place] [facility] Department (first floor) to check-in and test prep.  All radiology patients and guests should use entrance C2 at  Hospital, accessed from [address], even though the hospital's physical address listed is [address].     Please follow these instructions carefully (unless otherwise directed):  On the Night Before the Test: Be sure to Drink plenty of water. Do not consume any caffeinated/decaffeinated beverages or chocolate 12 hours prior to your test. Do not take any antihistamines 12 hours prior to your test. If the patient has contrast allergy: Patient will need a prescription for Prednisone and very clear instructions (as follows): Prednisone 50 mg - take 13 hours prior to test Take another Prednisone 50 mg 7 hours prior to test Take another Prednisone 50 mg 1 hour prior to test Take Benadryl 50 mg 1 hour prior to test Patient must complete all four doses of above prophylactic medications. Patient will need a ride after test due to Benadryl.  On the Day of the Test: Drink plenty of water until 1 hour prior to the test. Do not eat any food 4 hours prior to the test. You may take your regular medications prior to the test.  Take  metoprolol (Lopressor) two hours prior to test. HOLD Furosemide/Hydrochlorothiazide morning of the test. FEMALES- please wear underwire-free bra if available, avoid dresses & tight clothing  After the Test: Drink plenty of water. After receiving IV contrast, you may experience a mild flushed feeling. This is normal. On occasion, you may experience a mild rash up to 24 hours after the test. This is not dangerous. If this occurs, you can take Benadryl 25 mg and increase your fluid intake. If you experience trouble breathing, this can be serious. If it is severe call 911 IMMEDIATELY. If it is mild, please call our office. If you take any of these medications: Glipizide/Metformin, Avandament, Glucavance, please do not take 48 hours after completing test unless otherwise instructed.  We will call to schedule your test 2-4 weeks out understanding that some insurance companies will need an authorization prior to the service being performed.   For non-scheduling related questions, please contact the cardiac imaging nurse navigator should you have any questions/concerns: Sara Wallace, Cardiac Imaging Nurse Navigator Merle [place], Cardiac Imaging Nurse Navigator  Heart and V[facility] Services Direct Office Dial: [phone]     For scheduling needs, including cancellations and rescheduling, please call [place], [phone].    Follow-Up: At [facility], you and your health needs are our priority.  As part of our continuing mission to provide you with exceptional heart care, we have created designated Provider Care Teams.  These Care Teams include your primary Cardiologist (physician) and Advanced Practice Providers (APPs -  Physician Assistants and Nurse Practitioners) who all work together to provide you with the care you need, when you need it.  We recommend signing up for the patient portal called "MyChart".  Sign up information is provided on this After Visit Summary.  MyChart is  used to connect with patients for Virtual Visits (Telemedicine).  Patients are able to view lab/test results, encounter notes, upcoming appointments, etc.  Non-urgent messages can be sent to your provider as well.   To learn more about what you can do with MyChart, go to [email or web address].    Your next appointment:   Follow up will be based on the results of the above testing.   Important Information About Sugar         Signed, Candee Furbish, MD  04/13/2022 3:15 PM    [facility]

## 2022-04-13 NOTE — Patient Instructions (Addendum)
Medication Instructions:  The current medical regimen is effective;  continue present plan and medications.  *If you need a refill on your cardiac medications before your next appointment, please call your pharmacy*   Testing/Procedures:   Your cardiac CT will be scheduled at:   [facility] [address] [place], [place] [address] [phone]  Your cardiac CT will be scheduled at:  Please arrive at the [facility] and Children's Entrance (Entrance C2) of [facility] 30 minutes prior to test start time. You can use the FREE valet parking offered at entrance C (encouraged to control the heart rate for the test)  Proceed to the [facility] Radiology Department (first floor) to check-in and test prep.  All radiology patients and guests should use entrance C2 at [facility], accessed from [facility], even though the hospital's physical address listed is [address].     Please follow these instructions carefully (unless otherwise directed):  On the Night Before the Test: Be sure to Drink plenty of water. Do not consume any caffeinated/decaffeinated beverages or chocolate 12 hours prior to your test. Do not take any antihistamines 12 hours prior to your test. If the patient has contrast allergy: Patient will need a prescription for Prednisone and very clear instructions (as follows): Prednisone 50 mg - take 13 hours prior to test Take another Prednisone 50 mg 7 hours prior to test Take another Prednisone 50 mg 1 hour prior to test Take Benadryl 50 mg 1 hour prior to test Patient must complete all four doses of above prophylactic medications. Patient will need a ride after test due to Benadryl.  On the Day of the Test: Drink plenty of water until 1 hour prior to the test. Do not eat any food 4 hours prior to the test. You may take your regular medications prior to the test.  Take metoprolol (Lopressor) two hours prior to  test. HOLD Furosemide/Hydrochlorothiazide morning of the test. FEMALES- please wear underwire-free bra if available, avoid dresses & tight clothing  After the Test: Drink plenty of water. After receiving IV contrast, you may experience a mild flushed feeling. This is normal. On occasion, you may experience a mild rash up to 24 hours after the test. This is not dangerous. If this occurs, you can take Benadryl 25 mg and increase your fluid intake. If you experience trouble breathing, this can be serious. If it is severe call 911 IMMEDIATELY. If it is mild, please call our office. If you take any of these medications: Glipizide/Metformin, Avandament, Glucavance, please do not take 48 hours after completing test unless otherwise instructed.  We will call to schedule your test 2-4 weeks out understanding that some insurance companies will need an authorization prior to the service being performed.   For non-scheduling related questions, please contact the cardiac imaging nurse navigator should you have any questions/concerns: Marchia Bond, Cardiac Imaging Nurse Navigator Gordy Clement, Cardiac Imaging Nurse Navigator [place] [facility] Direct Office Dial: [phone]   For scheduling needs, including cancellations and rescheduling, please call [place], [phone].    Follow-Up: At [facility], you and your health needs are our priority.  As part of our continuing mission to provide you with exceptional heart care, we have created designated Provider Care Teams.  These Care Teams include your primary Cardiologist (physician) and Advanced Practice Providers (APPs -  Physician Assistants and Nurse Practitioners) who all work together to provide you with the care you need,  when you need it.  We recommend signing up for the patient portal called "MyChart".  Sign up information is provided on this After Visit Summary.  MyChart is used to connect with patients for Virtual  Visits (Telemedicine).  Patients are able to view lab/test results, encounter notes, upcoming appointments, etc.  Non-urgent messages can be sent to your provider as well.   To learn more about what you can do with MyChart, go to [email or web address].    Your next appointment:   Follow up will be based on the results of the above testing.   Important Information About Sugar

## 2022-04-30 ENCOUNTER — Telehealth (HOSPITAL_COMMUNITY): Payer: Self-pay | Admitting: *Deleted

## 2022-04-30 NOTE — Telephone Encounter (Signed)
Attempted to call patient regarding upcoming cardiac CT appointment. °Left message on voicemail with name and callback number ° °Kaedan Richert RN Navigator Cardiac Imaging °[facility] °[phone] Office °[phone] Cell ° °

## 2022-05-01 ENCOUNTER — Telehealth (HOSPITAL_COMMUNITY): Payer: Self-pay | Admitting: *Deleted

## 2022-05-01 NOTE — Telephone Encounter (Signed)
Reaching out to patient to offer assistance regarding upcoming cardiac imaging study; pt verbalizes understanding of appt date/time, parking situation and where to check in, pre-test NPO status and medications ordered, and verified current allergies; name and call back number provided for further questions should they arise  Gordy Clement RN [place] and [facility] [phone] office [phone] cell  Reviewed how to take 13 hour prep and metoprolol tartrate with patient and she verbalized understanding.  Patient aware she will need a driver for test and arrive at 3pm.

## 2022-05-01 NOTE — Progress Notes (Unsigned)
CC: Loss of Taste  HPI:   [name] is a 56 y.o. 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  Last seen in [facility] on July 20.      Past Medical History:  Diagnosis Date   Brain tumor (benign) (HCC)    Chronic lower back pain    Negative MRI of hips bilaterally, neg CT scan of A and P; MRI of T and L spine-> very mild deg changes from T4-5 and T7-8, cervical spondylotic changes at C5-6, very mild disc bulge at L2-3 without spinal stenosis, nl ESR, ANA, RF, HIV, TSH, and B12   DJD (degenerative joint disease) of cervical spine    Family history of adverse reaction to anesthesia    "sisters PONV"   Galactorrhea of both breasts 2009   R. sided w/ benign papilloma excised in 4/09. L sided in 5/09   Gallstones    Hyperlipidemia    Hypertension    Migraine    "monthly" (11/15/2016)   Neuromuscular disorder (HCC)    left arm and side of face    Neuropathy    Thought to be 2/2 nerve compression in the C-spine   Obesity    Personal history of adenomatous colonic polyp    09/2007 - diminutive adenoma (Gessner)   PONV (postoperative nausea and vomiting)    Stroke ([place])    2021- brain tumor bx caused ? stroke-    Tobacco abuse      Review of Systems:    Reports loss of taste, fatigue Denies cough, dyspnea, congestion, rhinorrhea, sore throat, headache, chills, fever, night sweats, myalgias    Physical Exam:  Vitals:   05/02/22 0934  BP: (!) 180/84  Pulse: 69  Temp: (!) 97.5 F (36.4 C)  TempSrc: Oral  SpO2: 100%  Weight: 212 lb 6.4 oz (96.3 kg)  Height: '5\' 6"'  (1.676 m)    General:   awake and alert, sitting comfortably in chair, cooperative, not in acute distress Skin:   warm and dry, intact without any obvious lesions or scars, no rashes or lesions  Lungs:   normal respiratory effort, breathing unlabored, symmetrical  chest rise, no crackles or wheezing Cardiac:   regular rate and rhythm, normal S1 and S2 Neurologic:   oriented to person-place-time, no facial droop, extraocular eye movements intact Psychiatric:   mood and affect normal, intelligible speech    Assessment & Plan:   Essential hypertension Has a long history of hypertension and has been prescribed metoprolol, which she has stopped taking.  Her blood pressure today was 180/84 and on subsequent measurement was 171/81.  She does state that she is especially anxious for her upcoming procedure later today.  -Advise daily adherence to metoprolol -Encourage investing in a blood pressure cuff and taking blood pressure measurements at home   Fatigue Patient reports feeling particularly tired since separating from her wife about 4 to 5 months ago.  Her mood improved, she continues to feel tired.  Reports daytime sleepiness, snoring reported by her ex-wife, and she sometimes wakes up out of breath. These symptoms are all consistent with OSA.  -Refer to sleep medicine for sleep study   Hypogeusia Patient started to notice a loss of taste about 4 to 5 months ago.  Denies any obvious triggers including illness and travel.  Does not identify an emotional trigger, specifically her and her wife separated,  causing her to become depressed for several months.  Also started to become more tired than usual around that same time. She continues to experience fatigue, but says that her mood has improved.  The only medication change within this time period was switching from gabapentin to pregabalin about two months ago.  Loss of taste is isolated.  It is not accompanied by cough, dyspnea, congestion, rhinorrhea, sore throat, headache, chills, fever, night sweats, or myalgias.  Food does not taste unpleasant, it is just not good and it lacks flavor.  He reports reduced appetite, and has lost significant amount of weight since her loss of taste symptoms began.  Recent  CMP revealed 3.1 potassium and 130 glucose, but was otherwise unremarkable.  Her CBC from June 2023 was completely normal. Her TSH has been slowly uptrending since 2011, but is still within normal limits as of March 2023.  -Order CMP, TSH, and vitamin B12 then follow-up in about one month -Recommend following through on the brain MRI and with neurosurgery     See Encounters Tab for problem based charting.  Patient seen with Dr.  Saverio Danker

## 2022-05-02 ENCOUNTER — Encounter: Payer: Self-pay | Admitting: Student

## 2022-05-02 ENCOUNTER — Ambulatory Visit (HOSPITAL_COMMUNITY)
Admission: RE | Admit: 2022-05-02 | Discharge: 2022-05-02 | Disposition: A | Discharge status: Home / Self Care | Payer: Commercial Managed Care - HMO | Source: Ambulatory Visit | Attending: Cardiology | Admitting: Cardiology

## 2022-05-02 ENCOUNTER — Other Ambulatory Visit: Payer: Self-pay

## 2022-05-02 ENCOUNTER — Ambulatory Visit (INDEPENDENT_AMBULATORY_CARE_PROVIDER_SITE_OTHER): Payer: Commercial Managed Care - HMO | Admitting: Student

## 2022-05-02 VITALS — BP 180/84 | HR 69 | Temp 97.5°F | Ht 66.0 in | Wt 212.4 lb

## 2022-05-02 DIAGNOSIS — R438 Other disturbances of smell and taste: Secondary | ICD-10-CM

## 2022-05-02 DIAGNOSIS — I10 Essential (primary) hypertension: Secondary | ICD-10-CM

## 2022-05-02 DIAGNOSIS — R072 Precordial pain: Secondary | ICD-10-CM | POA: Diagnosis present

## 2022-05-02 DIAGNOSIS — F17210 Nicotine dependence, cigarettes, uncomplicated: Secondary | ICD-10-CM | POA: Diagnosis not present

## 2022-05-02 DIAGNOSIS — Z720 Tobacco use: Secondary | ICD-10-CM | POA: Insufficient documentation

## 2022-05-02 DIAGNOSIS — R5383 Other fatigue: Secondary | ICD-10-CM | POA: Diagnosis not present

## 2022-05-02 HISTORY — DX: Other disturbances of smell and taste: R43.8

## 2022-05-02 MED ORDER — IOHEXOL 350 MG/ML SOLN
100.0000 mL | Freq: Once | INTRAVENOUS | Status: AC | PRN
  Administered 2022-05-02: 100 mL via INTRAVENOUS

## 2022-05-02 MED ORDER — NITROGLYCERIN 0.4 MG SL SUBL
SUBLINGUAL_TABLET | SUBLINGUAL | Status: AC
  Filled 2022-05-02: qty 2

## 2022-05-02 MED ORDER — NITROGLYCERIN 0.4 MG SL SUBL
0.8000 mg | SUBLINGUAL_TABLET | Freq: Once | SUBLINGUAL | Status: AC
  Administered 2022-05-02: 0.8 mg via SUBLINGUAL

## 2022-05-02 NOTE — Patient Instructions (Signed)
  Thank you, [name], for allowing [place] to provide your care today. Today we discussed . . .  > Loss of Taste       - we have performed some lab tests, which will help [place] figure out what is causing your taste loss       - it is important to have that brain MRI performed, which may also help identify the cause > Hypertension       - please start taking your metoprolol every day, which will help control your blood pressure       - we also recommend investing in a blood pressure cuff and taking your measurements at home > Fatigue       - your daytime fatigue may be caused by obstructive sleep apnea       - we have put in an order for a sleep study, which can identify this problem   I have ordered the following labs for you:  Lab Orders         CMP14 + Anion Gap         TSH         Vitamin B12       Tests ordered today:  None   Referrals ordered today:   Referral Orders         Ambulatory referral to Sleep Studies        I have ordered the following medication/changed the following medications:   Stop the following medications: There are no discontinued medications.   Start the following medications: No orders of the defined types were placed in this encounter.     Follow up:  1 month     Remember:  Please start taking your metoprolol for high blood pressure. We also recommend buying a blood pressure cuff for home use and getting a sleep study done for your daytime fatigue. We will see you back in clinic in about one month!   Should you have any questions or concerns please call the internal medicine clinic at [phone].     Roswell Nickel, MD [place]

## 2022-05-02 NOTE — Assessment & Plan Note (Signed)
Has a long history of hypertension and has been prescribed metoprolol, which she has stopped taking.  Her blood pressure today was 180/84 and on subsequent measurement was 171/81.  She does state that she is especially anxious for her upcoming procedure later today.  -Advise daily adherence to metoprolol -Encourage investing in a blood pressure cuff and taking blood pressure measurements at home

## 2022-05-02 NOTE — Assessment & Plan Note (Addendum)
Patient started to notice a loss of taste about 4 to 5 months ago.  Denies any obvious triggers including illness and travel.  Does not identify an emotional trigger, specifically her and her wife separated, causing her to become depressed for several months.  Also started to become more tired than usual around that same time. She continues to experience fatigue, but says that her mood has improved.  The only medication change within this time period was switching from gabapentin to pregabalin about two months ago.  Loss of taste is isolated.  It is not accompanied by cough, dyspnea, congestion, rhinorrhea, sore throat, headache, chills, fever, night sweats, or myalgias.  Food does not taste unpleasant, it is just not good and it lacks flavor.  He reports reduced appetite, and has lost significant amount of weight since her loss of taste symptoms began.  Recent CMP revealed 3.1 potassium and 130 glucose, but was otherwise unremarkable.  Her CBC from June 2023 was completely normal. Her TSH has been slowly uptrending since 2011, but is still within normal limits as of March 2023.  -Order CMP, TSH, and vitamin B12 then follow-up in about one month -Recommend following through on the brain MRI and with neurosurgery

## 2022-05-02 NOTE — Assessment & Plan Note (Addendum)
Patient reports feeling particularly tired since separating from her wife about 4 to 5 months ago.  Her mood improved, she continues to feel tired.  Reports daytime sleepiness, snoring reported by her ex-wife, and she sometimes wakes up out of breath. These symptoms are all consistent with OSA.  -Refer to sleep medicine for sleep study

## 2022-05-03 LAB — CMP14 + ANION GAP
ALT: 19 IU/L (ref 0–32)
AST: 16 IU/L (ref 0–40)
Albumin/Globulin Ratio: 1.6 (ref 1.2–2.2)
Albumin: 4.4 g/dL (ref 3.8–4.9)
Alkaline Phosphatase: 111 IU/L (ref 44–121)
Anion Gap: 15 mmol/L (ref 10.0–18.0)
BUN/Creatinine Ratio: 11 (ref 9–23)
BUN: 9 mg/dL (ref 6–24)
Bilirubin Total: 0.8 mg/dL (ref 0.0–1.2)
CO2: 25 mmol/L (ref 20–29)
Calcium: 10.2 mg/dL (ref 8.7–10.2)
Chloride: 101 mmol/L (ref 96–106)
Creatinine, Ser: 0.8 mg/dL (ref 0.57–1.00)
Globulin, Total: 2.7 g/dL (ref 1.5–4.5)
Glucose: 132 mg/dL — ABNORMAL HIGH (ref 70–99)
Potassium: 4.1 mmol/L (ref 3.5–5.2)
Sodium: 141 mmol/L (ref 134–144)
Total Protein: 7.1 g/dL (ref 6.0–8.5)
eGFR: 86 mL/min/{1.73_m2} (ref >59)

## 2022-05-03 LAB — VITAMIN B12: Vitamin B-12: 794 pg/mL (ref 232–1245)

## 2022-05-03 LAB — TSH: TSH: 0.552 u[IU]/mL (ref 0.450–4.500)

## 2022-05-03 NOTE — Progress Notes (Signed)
Internal Medicine Clinic Attending  I saw and evaluated the patient.  I personally confirmed the key portions of the history and exam documented by Dr. Jodi Mourning and I reviewed pertinent patient test results.  The assessment, diagnosis, and plan were formulated together and I agree with the documentation in the resident's note with the following clarifications.  Hypertension: elevated BP today, possible contribution from anxiety 2/2 upcoming cardiac CT however there is concern that underlying BP is likely high as well. Kathleen Stone previously was prescribed olmesartan-amlodipine-HCTZ 40-10-12.5 mg qd and metoprolol tartrate 25 mg BID, both of which were stopped at hospital discharge 02/2022. BP at f/u visit 7/20 was reasonable and no medications were restarted at that time. Metoprolol remains on medication list, however it appears this is just pre-imaging dose per cardiology. We will schedule close f/u with Kathleen Stone to readdress BP and resume pharmacologic therapy if appropriate. Hypogeusia: unclear etiology, checking kidney and liver function, TSH, vitamin B12. Possibly related to ongoing tobacco use which has been addressed at multiple visits. Will need to continue to address cessation. 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 Although this tumor is unlikely the cause of isolated change in taste sensation without additional deficits, we discussed the importance of f/u. We discussed MRI scheduling with referral coordinator, Ravia, who notes some issues with insurance that they are still working through.

## 2022-05-04 ENCOUNTER — Encounter: Payer: Self-pay | Admitting: *Deleted

## 2022-05-10 ENCOUNTER — Other Ambulatory Visit: Payer: Self-pay | Admitting: Student

## 2022-05-10 MED ORDER — METOPROLOL TARTRATE 25 MG PO TABS: 25.0000 mg | ORAL_TABLET | Freq: Two times a day (BID) | ORAL | 2 refills | Status: DC

## 2022-05-21 ENCOUNTER — Emergency Department (HOSPITAL_BASED_OUTPATIENT_CLINIC_OR_DEPARTMENT_OTHER): Payer: Commercial Managed Care - HMO

## 2022-05-21 ENCOUNTER — Encounter (HOSPITAL_BASED_OUTPATIENT_CLINIC_OR_DEPARTMENT_OTHER): Payer: Self-pay

## 2022-05-21 ENCOUNTER — Other Ambulatory Visit: Payer: Self-pay

## 2022-05-21 ENCOUNTER — Emergency Department (HOSPITAL_BASED_OUTPATIENT_CLINIC_OR_DEPARTMENT_OTHER)
Admission: EM | Admit: 2022-05-21 | Discharge: 2022-05-21 | Disposition: A | Discharge status: Home / Self Care | Payer: Commercial Managed Care - HMO | Attending: Emergency Medicine | Admitting: Emergency Medicine

## 2022-05-21 DIAGNOSIS — R531 Weakness: Secondary | ICD-10-CM

## 2022-05-21 DIAGNOSIS — E876 Hypokalemia: Secondary | ICD-10-CM | POA: Diagnosis not present

## 2022-05-21 DIAGNOSIS — E860 Dehydration: Secondary | ICD-10-CM | POA: Insufficient documentation

## 2022-05-21 LAB — CBC WITH DIFFERENTIAL/PLATELET
Abs Immature Granulocytes: 0.03 10*3/uL (ref 0.00–0.07)
Basophils Absolute: 0.1 10*3/uL (ref 0.0–0.1)
Basophils Relative: 1 %
Eosinophils Absolute: 0.1 10*3/uL (ref 0.0–0.5)
Eosinophils Relative: 1 %
HCT: 41.6 % (ref 36.0–46.0)
Hemoglobin: 14.1 g/dL (ref 12.0–15.0)
Immature Granulocytes: 0 %
Lymphocytes Relative: 28 %
Lymphs Abs: 3.4 10*3/uL (ref 0.7–4.0)
MCH: 29 pg (ref 26.0–34.0)
MCHC: 33.9 g/dL (ref 30.0–36.0)
MCV: 85.6 fL (ref 80.0–100.0)
Monocytes Absolute: 0.6 10*3/uL (ref 0.1–1.0)
Monocytes Relative: 5 %
Neutro Abs: 8.1 10*3/uL — ABNORMAL HIGH (ref 1.7–7.7)
Neutrophils Relative %: 65 %
Platelets: 204 10*3/uL (ref 150–400)
RBC: 4.86 MIL/uL (ref 3.87–5.11)
RDW: 15.5 % (ref 11.5–15.5)
WBC: 12.2 10*3/uL — ABNORMAL HIGH (ref 4.0–10.5)
nRBC: 0 % (ref 0.0–0.2)

## 2022-05-21 LAB — COMPREHENSIVE METABOLIC PANEL
ALT: 19 U/L (ref 0–44)
AST: 20 U/L (ref 15–41)
Albumin: 4 g/dL (ref 3.5–5.0)
Alkaline Phosphatase: 66 U/L (ref 38–126)
Anion gap: 10 (ref 5–15)
BUN: 9 mg/dL (ref 6–20)
CO2: 27 mmol/L (ref 22–32)
Calcium: 9.4 mg/dL (ref 8.9–10.3)
Chloride: 100 mmol/L (ref 98–111)
Creatinine, Ser: 0.72 mg/dL (ref 0.44–1.00)
GFR, Estimated: >60 mL/min (ref >60)
Glucose, Bld: 124 mg/dL — ABNORMAL HIGH (ref 70–99)
Potassium: 2.6 mmol/L — CL (ref 3.5–5.1)
Sodium: 137 mmol/L (ref 135–145)
Total Bilirubin: 1.6 mg/dL — ABNORMAL HIGH (ref 0.3–1.2)
Total Protein: 7 g/dL (ref 6.5–8.1)

## 2022-05-21 LAB — LIPASE, BLOOD: Lipase: <10 U/L — ABNORMAL LOW (ref 11–51)

## 2022-05-21 MED ORDER — POTASSIUM CHLORIDE 10 MEQ/100ML IV SOLN
10.0000 meq | Freq: Once | INTRAVENOUS | Status: AC
  Administered 2022-05-21: 10 meq via INTRAVENOUS
  Filled 2022-05-21: qty 100

## 2022-05-21 MED ORDER — POTASSIUM CHLORIDE CRYS ER 20 MEQ PO TBCR
40.0000 meq | EXTENDED_RELEASE_TABLET | Freq: Once | ORAL | Status: AC
  Administered 2022-05-21: 40 meq via ORAL
  Filled 2022-05-21: qty 2

## 2022-05-21 MED ORDER — POTASSIUM CHLORIDE CRYS ER 20 MEQ PO TBCR: 20.0000 meq | EXTENDED_RELEASE_TABLET | Freq: Every day | ORAL | 0 refills | Status: DC

## 2022-05-21 MED ORDER — SODIUM CHLORIDE 0.9 % IV BOLUS
1000.0000 mL | Freq: Once | INTRAVENOUS | Status: AC
  Administered 2022-05-21: 1000 mL via INTRAVENOUS

## 2022-05-21 NOTE — ED Notes (Signed)
Pt discharged. Instructions and prescriptions given. AAOX4. Pt in no apparent distress or pain. The opportunity to ask questions was provided.  

## 2022-05-21 NOTE — Discharge Instructions (Addendum)
Work-up was unremarkable today.  I have written you for some potassium to take for the next several days.  You got some potassium already today.  Take your next dose tomorrow.  Follow-up with your primary care doctor to talk about your mental health as discussed.

## 2022-05-21 NOTE — ED Triage Notes (Addendum)
Patient arrives with complaints of minimal po intake for several weeks, fatigue, and weakness.   Patient's sister reports that the patient has brain tumor that the patient needs to be scanned for as well.

## 2022-05-21 NOTE — ED Provider Notes (Signed)
[facility] EMERGENCY DEPT Provider Note   CSN: [id number] Arrival date & time: 05/21/22  1635     History  Chief Complaint  Patient presents with   Weakness   Fatigue    Kathleen Stone is a 56 y.o. female.  Patient here with weakness, decreased appetite.  History of brain mass is supposed to follow-up with [facility] neurosurgery.  Denies any headache or chest pain or shortness of breath.  Family concerned that she may be depressed given her new health diagnosis and she is going through divorce now.  She denies any suicidal or homicidal ideation.  Mostly concerned about may be dehydration.  She has not been eating or drinking much.  Denies any new weakness or numbness speech changes or vision changes.  The history is provided by the patient.       Home Medications Prior to Admission medications   Medication Sig Start Date End Date Taking? Authorizing Provider  potassium chloride SA (KLOR-CON M) 20 MEQ tablet Take 1 tablet (20 mEq total) by mouth daily for 3 days. 05/21/22 05/24/22 Yes Dvon Jiles, DO  albuterol (PROVENTIL HFA) 108 (90 Base) MCG/ACT inhaler Inhale 1-2 puffs into the lungs every 6 (six) hours as needed for wheezing or shortness of breath. 11/01/21   Atway, Rayann N, DO  atorvastatin (LIPITOR) 40 MG tablet Take 1 tablet (40 mg total) by mouth daily. 03/06/22   Wayland Denis, MD  famotidine (PEPCID) 10 MG tablet Take 1 tablet (10 mg total) by mouth 2 (two) times daily. 03/13/22   Masters, Katie, DO  metoprolol tartrate (LOPRESSOR) 25 MG tablet Take 1 tablet (25 mg total) by mouth 2 (two) times daily. Take 1 tablet 2 hours before your CT scan 05/10/22   Serita Butcher, MD  nicotine (NICODERM CQ - DOSED IN MG/24 HOURS) 21 mg/24hr patch Place 1 patch (21 mg total) onto the skin daily. 03/13/22 03/13/23  Masters, Katie, DO  nitroGLYCERIN (NITROSTAT) 0.4 MG SL tablet Place 1 tablet (0.4 mg total) under the tongue every 5 (five) minutes as needed for chest pain. 11/01/21  04/13/22  Atway, Jeananne Rama, DO  predniSONE (DELTASONE) 50 MG tablet Take 1 tab 13 hrs before, 1 tab 7 hr before and 1 tab 1 hr before your CT scan 04/13/22   Jerline Pain, MD  pregabalin (LYRICA) 50 MG capsule Take 1 capsule (50 mg total) by mouth 3 (three) times daily. Start taking it twice daily for 1 week and increase to three times daily after that. 02/21/22 05/22/22  Idamae Schuller, MD      Allergies    Iohexol    Review of Systems   Review of Systems  Physical Exam Updated Vital Signs BP (!) 153/81 (BP Location: Right Arm)   Pulse 86   Temp 97.6 F (36.4 C) (Oral)   Resp 18   Ht '5\' 6"'$  (1.676 m)   Wt 91.3 kg   LMP 02/07/2016 (Exact Date)   SpO2 100%   BMI 32.49 kg/m  Physical Exam Vitals and nursing note reviewed.  Constitutional:      General: She is not in acute distress.    Appearance: She is well-developed. She is not ill-appearing.  HENT:     Head: Normocephalic and atraumatic.     Nose: Nose normal.     Mouth/Throat:     Mouth: Mucous membranes are moist.  Eyes:     Extraocular Movements: Extraocular movements intact.     Conjunctiva/sclera: Conjunctivae normal.  Pupils: Pupils are equal, round, and reactive to light.  Cardiovascular:     Rate and Rhythm: Normal rate and regular rhythm.     Pulses: Normal pulses.     Heart sounds: Normal heart sounds. No murmur heard. Pulmonary:     Effort: Pulmonary effort is normal. No respiratory distress.     Breath sounds: Normal breath sounds.  Abdominal:     Palpations: Abdomen is soft.     Tenderness: There is no abdominal tenderness.  Musculoskeletal:        General: No swelling.     Cervical back: Normal range of motion and neck supple.  Skin:    General: Skin is warm and dry.     Capillary Refill: Capillary refill takes less than 2 seconds.  Neurological:     General: No focal deficit present.     Mental Status: She is alert and oriented to person, place, and time.     Cranial Nerves: No cranial nerve  deficit.     Sensory: No sensory deficit.     Motor: No weakness.     Coordination: Coordination normal.     Comments: 5+ out of 5 strength throughout, normal sensation, no drift, normal speech  Psychiatric:        Mood and Affect: Mood normal.     ED Results / Procedures / Treatments   Labs (all labs ordered are listed, but only abnormal results are displayed) Labs Reviewed  CBC WITH DIFFERENTIAL/PLATELET - Abnormal; Notable for the following components:      Result Value   WBC 12.2 (*)    Neutro Abs 8.1 (*)    All other components within normal limits  COMPREHENSIVE METABOLIC PANEL - Abnormal; Notable for the following components:   Potassium 2.6 (*)    Glucose, Bld 124 (*)    Total Bilirubin 1.6 (*)    All other components within normal limits  LIPASE, BLOOD - Abnormal; Notable for the following components:   Lipase <10 (*)    All other components within normal limits    EKG EKG Interpretation  Date/Time:  Monday May 21 2022 16:52:24 EDT Ventricular Rate:  88 PR Interval:  134 QRS Duration: 88 QT Interval:  424 QTC Calculation: 513 R Axis:   -11 Text Interpretation: Normal sinus rhythm Right atrial enlargement When compared with ECG of 12-Mar-2022 16:07, PREVIOUS ECG IS PRESENT Confirmed by Lennice Sites ([id number]) on 05/21/2022 4:55:25 PM  Radiology CT Head Wo Contrast  Result Date: 05/21/2022 CLINICAL DATA:  Headaches EXAM: CT HEAD WITHOUT CONTRAST TECHNIQUE: Contiguous axial images were obtained from the base of the skull through the vertex without intravenous contrast. RADIATION DOSE REDUCTION: This exam was performed according to the departmental dose-optimization program which includes automated exposure control, adjustment of the mA and/or kV according to patient size and/or use of iterative reconstruction technique. COMPARISON:  03/12/2022 FINDINGS: Brain: There are no signs of bleeding within the cranium. There is 1.5 x 1.4 cm soft tissue lesion in right  midbrain which measures slightly smaller in size which may be due to difference in measuring techniques. Overall size of the lesion has not changed in comparison with previous MR brain done on 02/24/2020. There is no adjacent edema or mass effect. There is no dilation of ventricles. There is previous right frontal craniotomy with adjacent encephalomalacia. This finding appears stable. Cortical sulci are prominent. Vascular: Unremarkable. Skull: There is previous right frontal craniotomy. Sinuses/Orbits: Unremarkable. Other: None. IMPRESSION: No acute intracranial findings are seen. There  is a 1.5 cm soft tissue density lesion in the right midbrain which measures slightly smaller in size which could easily be due to measuring techniques rather than actual decrease in size. There is no definite interval increase in size. There is no adjacent edema or mass effect. There is no dilation of ventricles. Electronically Signed   By: Elmer Picker M.D.   On: 05/21/2022 17:24    Procedures Procedures    Medications Ordered in ED Medications  potassium chloride 10 mEq in 100 mL IVPB (10 mEq Intravenous New Bag/Given 05/21/22 1757)  sodium chloride 0.9 % bolus 1,000 mL (1,000 mLs Intravenous New Bag/Given 05/21/22 1720)  potassium chloride SA (KLOR-CON M) CR tablet 40 mEq (40 mEq Oral Given 05/21/22 1753)    ED Course/ Medical Decision Making/ A&P                           Medical Decision Making Amount and/or Complexity of Data Reviewed Labs: ordered. Radiology: ordered.  Risk Prescription drug management.   Kathleen Stone is here with generalized weakness.  New history of suspected brain cancer supposed to follow-up with neurosurgery, history of high cholesterol.  Differential diagnosis possibly dehydration versus electrolyte abnormality.  Overall her neuro exam is unremarkable.  I have a very low suspicion that is been any acute change in her brain mass process.  Family is concerned for possibly  depression leading to her decreased p.o. intake which I think is reasonable.  Overall she appears well with normal vitals.  No fever.  Will check labs including CBC, CMP, CT head, will give IV fluid bolus.  Per my review of radiology report of CT scan there is no significant change in her brain mass.  Blood work per my review and interpretation shows a potassium of 2.6 but otherwise no significant leukocytosis or anemia or electrolyte abnormality.  Feeling better after some IV fluids and was repleted with IV and p.o. potassium.  Will prescribe for potassium for the next few days.  EKG showed sinus rhythm.  No ischemic changes per my review and interpretation.  Overall do recommend close follow-up with primary care doctor about her mental health.  Discharged in good condition.  This chart was dictated using voice recognition software.  Despite best efforts to proofread,  errors can occur which can change the documentation meaning.         Final Clinical Impression(s) / ED Diagnoses Final diagnoses:  Weakness  Hypokalemia    Rx / DC Orders ED Discharge Orders          Ordered    potassium chloride SA (KLOR-CON M) 20 MEQ tablet  Daily        05/21/22 1829              Lennice Sites, DO 05/21/22 1829

## 2022-05-23 ENCOUNTER — Ambulatory Visit (INDEPENDENT_AMBULATORY_CARE_PROVIDER_SITE_OTHER): Payer: Commercial Managed Care - HMO | Admitting: Student

## 2022-05-23 ENCOUNTER — Encounter: Payer: Commercial Managed Care - HMO | Admitting: Internal Medicine

## 2022-05-23 VITALS — BP 156/70 | HR 67 | Wt 202.9 lb

## 2022-05-23 DIAGNOSIS — R634 Abnormal weight loss: Secondary | ICD-10-CM

## 2022-05-23 DIAGNOSIS — R5383 Other fatigue: Secondary | ICD-10-CM | POA: Diagnosis not present

## 2022-05-23 NOTE — Patient Instructions (Signed)
Kathleen Stone, it was a pleasure seeing you today!  Today we discussed: - I'd like to get some imaging for further evaluation. I will call you with the results and next steps.   Follow-up:  2 weeks    Please make sure to arrive 15 minutes prior to your next appointment. If you arrive late, you may be asked to reschedule.   We look forward to seeing you next time. Please call our clinic at [phone] if you have any questions or concerns. The best time to call is Monday-Friday from 9am-4pm, but there is someone available 24/7. If after hours or the weekend, call the main hospital number and ask for the Internal Medicine Resident On-Call. If you need medication refills, please notify your pharmacy one week in advance and they will send [place] a request.  Thank you for letting us take part in your care. Wishing you the best!  Thank you, Sanjuan Dame, MD

## 2022-05-24 ENCOUNTER — Encounter: Payer: Commercial Managed Care - HMO | Admitting: Internal Medicine

## 2022-05-25 NOTE — Progress Notes (Unsigned)
CC: fatigue, poor appetite  HPI:  Kathleen Stone is a 56 y.o. person with hypertension, pilocytic astrocytoma, tobacco use disorder presenting to [facility] for poor appetite.   Please see problem-based list for further details, assessments, and plans.  Past Medical History:  Diagnosis Date   Brain tumor (benign) (HCC)    Chronic lower back pain    Negative MRI of hips bilaterally, neg CT scan of A and P; MRI of T and L spine-> very mild deg changes from T4-5 and T7-8, cervical spondylotic changes at C5-6, very mild disc bulge at L2-3 without spinal stenosis, nl ESR, ANA, RF, HIV, TSH, and B12   DJD (degenerative joint disease) of cervical spine    Family history of adverse reaction to anesthesia    "sisters PONV"   Galactorrhea of both breasts 2009   R. sided w/ benign papilloma excised in 4/09. L sided in 5/09   Gallstones    Hyperlipidemia    Hypertension    Migraine    "monthly" (11/15/2016)   Neuromuscular disorder ([place])    left arm and side of face    Neuropathy    Thought to be 2/2 nerve compression in the C-spine   Obesity    Personal history of adenomatous colonic polyp    09/2007 - diminutive adenoma ([place])   PONV (postoperative nausea and vomiting)    Stroke ([place])    2021- brain tumor bx caused ? stroke-    Tobacco abuse    Review of Systems:  As per HPI  Physical Exam:  Vitals:   05/23/22 1531  BP: (!) 156/70  Pulse: 67  SpO2: 99%  Weight: 202 lb 14.4 oz (92 kg)   General: Tired-appearing, resting in wheelchair, no acute distress HENT: Normocephalic, atraumatic. No temporal wasting appreciated. No cervical or supraclavicular lymphadenopathy.  CV: Regular rate, rhythm. No murmurs appreciated. Warm extremities.  Pulm: Normal work of breathing on room air. Clear to ausculation bilaterally.  GI: Abdomen soft, non-distended, non-tender. Normoactive bowel sounds.  Skin: Warm, dry. No rashes appreciated.  Neuro: Awake, alert, conversing appropriately.  Grossly non-focal.   Assessment & Plan:   Unintentional weight loss Kathleen Stone is presenting today to discuss that she overall has not been feeling well over the last few months and has noticed significant weight loss. Reports that she has had to go down 10 pant sizes over the last year and has not been intentionally losing weight. More recently, describes that she will eat only a small amount of food before not having an appetite again. This has been occurring for the last couple of months. Has tried many different foods without improvement of symptoms. Also states that any food has caused non-bloody diarrhea, 1-2 times per day. Mentions watery stool with some mucous. Also describes feeling very fatigued overall, having no energy to do regular activities. Denies night sweats, dyspnea, cough, nausea, vomiting, abdominal pain, constipation.    Patient is presenting with a constellation of weight loss, fatigue, and early satiety. Upon chart review, there has been a significant weight loss of 70lb over the last year. Physical exam unrevealing - no abdominal pain, lymphadenopathy, lungs clear. Given her symptoms in setting of tobacco use, I am concerned for a possibly underlying malignancy. She is currently up-to-date on age-appropriate cancer screenings. There is recent lab work that overall is unrevealing. I have discussed this with the patient, we have decided to move forward with imaging for further evaluation.   - CT chest w/o contrast -  CT abdomen/pelvis with contrast - Follow-up in two weeks  Patient discussed with Dr. Jacquenette Shone, MD Internal Medicine PGY-3 Pager: [phone]

## 2022-05-26 DIAGNOSIS — R634 Abnormal weight loss: Secondary | ICD-10-CM | POA: Insufficient documentation

## 2022-05-26 HISTORY — DX: Abnormal weight loss: R63.4

## 2022-05-26 NOTE — Assessment & Plan Note (Deleted)
Ms. Quinby is presenting today to discuss that she overall has not been feeling well over the last few months. Describes that she will eat only a small amount of food before not having an appetite again. This has been occurring for the last couple of months. More recently, notes that any food has caused non-bloody diarrhea, 1-2 times per day. Mentions watery stool with some mucous. Also describes feeling very fatigued overall, having no energy to do regular activities. Patient also mentions significant weight loss over the last year, describing that she has had to go down 10 pant sizes. She has not been intentionally losing weight. Denies night sweats, dyspnea, cough, nausea, vomiting, abdominal pain, constipation.   Patient is presenting with a constellation of weight loss, fatigue, and early satiety. Upon chart review, there has been a significant weight loss of 70lb over the last year. Physical exam unrevealing - no abdominal pain, lymphadenopathy, lungs clear. Given her history of tobacco use, I am concerned for a possibly underlying malignancy. She is currently up-to-date on age-appropriate cancer screenings. I have discussed this with the patient, we have decided to move forward with imaging for further evaluation.

## 2022-05-26 NOTE — Assessment & Plan Note (Addendum)
Ms. Bellissimo is presenting today to discuss that she overall has not been feeling well over the last few months and has noticed significant weight loss. Reports that she has had to go down 10 pant sizes over the last year and has not been intentionally losing weight. More recently, describes that she will eat only a small amount of food before not having an appetite again. This has been occurring for the last couple of months. Has tried many different foods without improvement of symptoms. Also states that any food has caused non-bloody diarrhea, 1-2 times per day. Mentions watery stool with some mucous. Also describes feeling very fatigued overall, having no energy to do regular activities. Denies night sweats, dyspnea, cough, nausea, vomiting, abdominal pain, constipation.    Patient is presenting with a constellation of weight loss, fatigue, and early satiety. Upon chart review, there has been a significant weight loss of 70lb over the last year. Physical exam unrevealing - no abdominal pain, lymphadenopathy, lungs clear. Given her symptoms in setting of tobacco use, I am concerned for a possibly underlying malignancy. She is currently up-to-date on age-appropriate cancer screenings. There is recent lab work that overall is unrevealing. I have discussed this with the patient, we have decided to move forward with imaging for further evaluation.   - CT chest w/o contrast - CT abdomen/pelvis with contrast - Follow-up in two weeks

## 2022-05-28 NOTE — Progress Notes (Signed)
Internal Medicine Clinic Attending ? ?Case discussed with Dr. Braswell  At the time of the visit.  We reviewed the resident?s history and exam and pertinent patient test results.  I agree with the assessment, diagnosis, and plan of care documented in the resident?s note.  ?

## 2022-06-01 ENCOUNTER — Emergency Department (HOSPITAL_BASED_OUTPATIENT_CLINIC_OR_DEPARTMENT_OTHER): Payer: Commercial Managed Care - HMO

## 2022-06-01 ENCOUNTER — Emergency Department (HOSPITAL_BASED_OUTPATIENT_CLINIC_OR_DEPARTMENT_OTHER)
Admission: EM | Admit: 2022-06-01 | Discharge: 2022-06-01 | Disposition: A | Discharge status: Home / Self Care | Payer: Commercial Managed Care - HMO | Attending: Emergency Medicine | Admitting: Emergency Medicine

## 2022-06-01 ENCOUNTER — Encounter (HOSPITAL_BASED_OUTPATIENT_CLINIC_OR_DEPARTMENT_OTHER): Payer: Self-pay

## 2022-06-01 ENCOUNTER — Other Ambulatory Visit: Payer: Self-pay

## 2022-06-01 DIAGNOSIS — R1084 Generalized abdominal pain: Secondary | ICD-10-CM

## 2022-06-01 DIAGNOSIS — K8689 Other specified diseases of pancreas: Secondary | ICD-10-CM

## 2022-06-01 DIAGNOSIS — Z79899 Other long term (current) drug therapy: Secondary | ICD-10-CM | POA: Diagnosis not present

## 2022-06-01 DIAGNOSIS — I10 Essential (primary) hypertension: Secondary | ICD-10-CM | POA: Insufficient documentation

## 2022-06-01 DIAGNOSIS — N939 Abnormal uterine and vaginal bleeding, unspecified: Secondary | ICD-10-CM | POA: Insufficient documentation

## 2022-06-01 DIAGNOSIS — N200 Calculus of kidney: Secondary | ICD-10-CM | POA: Insufficient documentation

## 2022-06-01 DIAGNOSIS — R109 Unspecified abdominal pain: Secondary | ICD-10-CM | POA: Diagnosis present

## 2022-06-01 LAB — COMPREHENSIVE METABOLIC PANEL
ALT: 17 U/L (ref 0–44)
AST: 22 U/L (ref 15–41)
Albumin: 4.1 g/dL (ref 3.5–5.0)
Alkaline Phosphatase: 72 U/L (ref 38–126)
Anion gap: 11 (ref 5–15)
BUN: 7 mg/dL (ref 6–20)
CO2: 29 mmol/L (ref 22–32)
Calcium: 9.8 mg/dL (ref 8.9–10.3)
Chloride: 102 mmol/L (ref 98–111)
Creatinine, Ser: 0.75 mg/dL (ref 0.44–1.00)
GFR, Estimated: >60 mL/min (ref >60)
Glucose, Bld: 116 mg/dL — ABNORMAL HIGH (ref 70–99)
Potassium: 3.5 mmol/L (ref 3.5–5.1)
Sodium: 142 mmol/L (ref 135–145)
Total Bilirubin: 1.2 mg/dL (ref 0.3–1.2)
Total Protein: 7.2 g/dL (ref 6.5–8.1)

## 2022-06-01 LAB — CBC
HCT: 44.9 % (ref 36.0–46.0)
Hemoglobin: 15 g/dL (ref 12.0–15.0)
MCH: 28.7 pg (ref 26.0–34.0)
MCHC: 33.4 g/dL (ref 30.0–36.0)
MCV: 86 fL (ref 80.0–100.0)
Platelets: 175 10*3/uL (ref 150–400)
RBC: 5.22 MIL/uL — ABNORMAL HIGH (ref 3.87–5.11)
RDW: 15.5 % (ref 11.5–15.5)
WBC: 12 10*3/uL — ABNORMAL HIGH (ref 4.0–10.5)
nRBC: 0 % (ref 0.0–0.2)

## 2022-06-01 LAB — LIPASE, BLOOD: Lipase: <10 U/L — ABNORMAL LOW (ref 11–51)

## 2022-06-01 MED ORDER — KETOROLAC TROMETHAMINE 30 MG/ML IJ SOLN
30.0000 mg | Freq: Once | INTRAMUSCULAR | Status: AC
  Administered 2022-06-01: 30 mg via INTRAVENOUS
  Filled 2022-06-01: qty 1

## 2022-06-01 NOTE — ED Provider Notes (Signed)
[facility] EMERGENCY DEPT Provider Note   CSN: [id number] Arrival date & time: 06/01/22  1657     History {Add pertinent medical, surgical, social history, OB history to HPI:1} Chief Complaint  Patient presents with  . Abdominal Cramping    Kathleen Stone is a 56 y.o. female.  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   8/16 [place], fibroids, thickened endometrium possible polyp  Had an HSG Polyps cause of postmenopausal bleeding    Home Medications Prior to Admission medications   Medication Sig Start Date End Date Taking? Authorizing Provider  albuterol (PROVENTIL HFA) 108 (90 Base) MCG/ACT inhaler Inhale 1-2 puffs into the lungs every 6 (six) hours as needed for wheezing or shortness of breath. 11/01/21   Atway, Rayann N, DO  atorvastatin (LIPITOR) 40 MG tablet Take 1 tablet (40 mg total) by mouth daily. 03/06/22   Wayland Denis, MD  famotidine (PEPCID) 10 MG tablet Take 1 tablet (10 mg total) by mouth 2 (two) times daily. 03/13/22   Masters, Katie, DO  metoprolol tartrate (LOPRESSOR) 25 MG tablet Take 1 tablet (25 mg total) by mouth 2 (two) times daily. Take 1 tablet 2 hours before your CT scan 05/10/22   Serita Butcher, MD  nicotine (NICODERM CQ - DOSED IN MG/24 HOURS) 21 mg/24hr patch Place 1 patch (21 mg total) onto the skin daily. 03/13/22 03/13/23  Masters, Katie, DO  nitroGLYCERIN (NITROSTAT) 0.4 MG SL tablet Place 1 tablet (0.4 mg total) under the tongue every 5 (five) minutes as needed for chest pain. 11/01/21 04/13/22  Atway, Rayann N, DO  potassium chloride SA (KLOR-CON M) 20 MEQ tablet Take 1 tablet (20 mEq total) by mouth daily for 3 days. 05/21/22 05/24/22  Curatolo, Adam, DO  predniSONE (DELTASONE) 50 MG tablet Take 1 tab 13 hrs before, 1 tab 7 hr before and 1 tab 1 hr before your CT scan 04/13/22   Jerline Pain,  MD  pregabalin (LYRICA) 50 MG capsule Take 1 capsule (50 mg total) by mouth 3 (three) times daily. Start taking it twice daily for 1 week and increase to three times daily after that. 02/21/22 05/22/22  Idamae Schuller, MD      Allergies    Iohexol    Review of Systems   Review of Systems  Physical Exam Updated Vital Signs BP (!) 154/62 (BP Location: Right Arm)   Pulse 72   Temp 98.2 F (36.8 C) (Oral)   Resp 16   Ht '5\' 6"'$  (1.676 m)   Wt 88.9 kg   LMP 02/07/2016 (Exact Date)   SpO2 100%   BMI 31.64 kg/m  Physical Exam  ED Results / Procedures / Treatments   Labs (all labs ordered are listed, but only abnormal results are displayed) Labs Reviewed  LIPASE, BLOOD  COMPREHENSIVE METABOLIC PANEL  CBC  URINALYSIS, ROUTINE W REFLEX MICROSCOPIC    EKG None  Radiology No results found.  Procedures Procedures  {Document cardiac monitor, telemetry assessment procedure when appropriate:1}  Medications Ordered in ED Medications - No data to display  ED Course/ Medical Decision Making/ A&P                           Medical Decision Making Amount and/or Complexity of Data Reviewed Labs: ordered.   ***  {Document critical care time when appropriate:1} {Document  review of labs and clinical decision tools ie heart score, Chads2Vasc2 etc:1}  {Document your independent review of radiology images, and any outside records:1} {Document your discussion with family members, caretakers, and with consultants:1} {Document social determinants of health affecting pt's care:1} {Document your decision making why or why not admission, treatments were needed:1} Final Clinical Impression(s) / ED Diagnoses Final diagnoses:  None    Rx / DC Orders ED Discharge Orders     None

## 2022-06-01 NOTE — ED Notes (Signed)
Entered room to find patient awake in L lateral position-- GCS 15.  RR even and unlabored on RA with symmetrical rise and fall of chest. Pt reports ongoing nonradiating pelvic cramping rated 5/10 - states pain worsened after being seen at GYN office and also worsened "after doctor came in and mashed on it".  Inquired if patient able to tell how often she is soaking pads however pt couldn't give definitive answer - states that she changed sanitary napkin after being seen at GYN office.  Attempted to obtain urine sample however pt states she is unable to void at this time -- urine specimen cup at bedside with extra sanitary napkins at this time.

## 2022-06-01 NOTE — ED Notes (Signed)
Pt now to CT dept via w/c at this time

## 2022-06-01 NOTE — ED Triage Notes (Signed)
Patient BIB [facility] from [facility].  Endorses Cramping, Vaginal Bleeding for approximately 1 week that has worsened. Visited OB Today who performed Pelvic Exam and Polyps were noted.   States Pain and Discomfort worsened shortly afterwards and EMS was contacted.   Somewhat Uncomfortable during Triage. A&Ox4. GCS 15. BIB [facility].

## 2022-06-01 NOTE — ED Notes (Signed)
Pt agreeable with d/c plan as discussed by provider- this nurse has verbally reinforced d/c instructions and provided pt with written copy- pt acknowledges verbal understanding and denies any add'l questions, concerns, needs- pt ambulatory independently at d/c with steady gait; no distress

## 2022-06-01 NOTE — ED Notes (Signed)
Late entry -- Pt returned from [place]; continues to rest in bed - no acute distress.

## 2022-06-04 ENCOUNTER — Other Ambulatory Visit (INDEPENDENT_AMBULATORY_CARE_PROVIDER_SITE_OTHER): Payer: Commercial Managed Care - HMO | Admitting: Internal Medicine

## 2022-06-04 ENCOUNTER — Other Ambulatory Visit: Payer: Self-pay | Admitting: Internal Medicine

## 2022-06-04 DIAGNOSIS — K8689 Other specified diseases of pancreas: Secondary | ICD-10-CM

## 2022-06-04 DIAGNOSIS — R1900 Intra-abdominal and pelvic swelling, mass and lump, unspecified site: Secondary | ICD-10-CM

## 2022-06-04 NOTE — Progress Notes (Signed)
Patient seen in ER 9/15. 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 Lipase wnl. Patient has had 30 lbs weight loss in last 3 months. I called and talked with radiology about recommended imaging. P: MRI Abd w wo contrast

## 2022-06-19 ENCOUNTER — Encounter: Payer: Self-pay | Admitting: Internal Medicine

## 2022-06-19 ENCOUNTER — Ambulatory Visit (INDEPENDENT_AMBULATORY_CARE_PROVIDER_SITE_OTHER): Payer: Commercial Managed Care - HMO | Admitting: Internal Medicine

## 2022-06-19 ENCOUNTER — Other Ambulatory Visit: Payer: Self-pay

## 2022-06-19 VITALS — BP 134/67 | HR 63 | Temp 97.8°F | Ht 66.0 in | Wt 189.9 lb

## 2022-06-19 DIAGNOSIS — R634 Abnormal weight loss: Secondary | ICD-10-CM

## 2022-06-19 DIAGNOSIS — Z23 Encounter for immunization: Secondary | ICD-10-CM | POA: Diagnosis not present

## 2022-06-19 DIAGNOSIS — K8689 Other specified diseases of pancreas: Secondary | ICD-10-CM

## 2022-06-19 MED ORDER — PANCREAZE 4200-14200 UNITS PO CPEP: 25000.0000 [IU] | ORAL_CAPSULE | Freq: Three times a day (TID) | ORAL | 0 refills | Status: DC

## 2022-06-19 MED ORDER — PANCREAZE 4200-14200 UNITS PO CPEP
25000.0000 [IU] | ORAL_CAPSULE | Freq: Three times a day (TID) | ORAL | 0 refills | Status: DC
Start: 2022-06-19 — End: 2023-04-12

## 2022-06-19 MED ORDER — ONDANSETRON HCL 4 MG PO TABS: 4.0000 mg | ORAL_TABLET | Freq: Three times a day (TID) | ORAL | 1 refills | Status: DC | PRN

## 2022-06-19 MED ORDER — ONDANSETRON HCL 4 MG PO TABS: 4.0000 mg | ORAL_TABLET | Freq: Every day | ORAL | 1 refills | Status: DC | PRN

## 2022-06-19 NOTE — Progress Notes (Signed)
   CC: weight loss  HPI:[name] is a 56 y.o. female who presents for evaluation of wt loss. Please see individual problem based A/P for details.  Patient seen for ongoing weight loss and persistent diarrhea. 6-7 episodes of diarrhea per day. Unchanged compared to prior visit. Appetite remains poor with little PO intake due to nausea. Able to tolerate fluids. Not dizzy/lightheaded and does not feel dehydrated. Having abd cramping. No back pain. She was recently seen at ED for vaginal bleeding. CT scan showed inflammatory pancreatic mass. She states she was not aware of CT findings or plan for MRI.   Depression, PHQ-9: Based on the patients  [place] Visit from 06/19/2022 in [place]  PHQ-9 Total Score 9       Past Medical History:  Diagnosis Date   Brain tumor (benign) (HCC)    Chronic lower back pain    Negative MRI of hips bilaterally, neg CT scan of A and P; MRI of T and L spine-> very mild deg changes from T4-5 and T7-8, cervical spondylotic changes at C5-6, very mild disc bulge at L2-3 without spinal stenosis, nl ESR, ANA, RF, HIV, TSH, and B12   DJD (degenerative joint disease) of cervical spine    Family history of adverse reaction to anesthesia    "sisters PONV"   Galactorrhea of both breasts 2009   R. sided w/ benign papilloma excised in 4/09. L sided in 5/09   Gallstones    Hyperlipidemia    Hypertension    Migraine    "monthly" (11/15/2016)   Neuromuscular disorder ([place])    left arm and side of face    Neuropathy    Thought to be 2/2 nerve compression in the C-spine   Obesity    Personal history of adenomatous colonic polyp    09/2007 - diminutive adenoma ([place])   PONV (postoperative nausea and vomiting)    Stroke ([place])    2021- brain tumor bx caused ? stroke-    Tobacco abuse    Review of Systems:   Review of Systems  Constitutional:  Positive for weight loss. Negative for fever.  Gastrointestinal:  Positive for  diarrhea and nausea. Negative for blood in stool and vomiting.     Physical Exam: Vitals:   06/19/22 0927 06/19/22 1003  BP: (!) 158/78 134/67  Pulse: 63 63  Temp: 97.8 F (36.6 C)   TempSrc: Oral   SpO2: 100%   Weight: 189 lb 14.4 oz (86.1 kg)   Height: $Remove'5\' 6"'NjeXhRn$  (1.676 m)      General: NAD HEENT: Conjunctiva nl , antiicteric sclerae, moist mucous membranes, no exudate or erythema Cardiovascular: Normal rate, regular rhythm.  No murmurs, rubs, or gallops Pulmonary : Equal breath sounds, No wheezes, rales, or rhonchi Abdominal: +BS, TTP in epigastric region Ext: No edema in lower extremities, no tenderness to palpation of lower extremities.   Assessment & Plan:   See Encounters Tab for problem based charting.  Pancreatic mass seen on imaging may be responsible for nausea with PO intake and diarrhea. There is concern for electrolyte abnormalities given poor po intake. Fortunately, sounds as though she is able to keep up with fluids. - bmp - zofran TID PRN for nausea - trial Pancreaze to help with diarrhea - f/u with MRI  Patient discussed with Dr. Daryll Drown

## 2022-06-19 NOTE — Assessment & Plan Note (Addendum)
Patient seen for ongoing weight loss and persistent diarrhea. 6-7 episodes of diarrhea per day. Unchanged compared to prior visit. Appetite remains poor with little PO intake due to nausea. Able to tolerate fluids. Not dizzy/lightheaded and does not feel dehydrated. Having abd cramping. No back pain. She was recently seen at ED for vaginal bleeding. CT scan showed inflammatory pancreatic mass. She states she was not aware of CT findings or plan for MRI.   +BS, TTP in epigastric region  Pancreatic mass seen on imaging may be responsible for nausea with PO intake and diarrhea. There is concern for electrolyte abnormalities given poor po intake. Fortunately, sounds as though she is able to keep up with fluids. - bmp - zofran TID PRN for nausea - trial Pancreaze to help with diarrhea - f/u with MRI

## 2022-06-19 NOTE — Patient Instructions (Addendum)
Dear Kathleen Stone,  Thank you for trusting [place] with your care today.  We discussed your weight loss and diarrhea.  For your nausea, please take Zofran up to 3 times daily as needed for nausea. You may take it before or after meals as necessary.  For your diarrhea, we will try a medication called Pancreaze. It may help with weight gain as well.  Please get the MRI done.  Please return in 1 month to follow up on your weight loss.

## 2022-06-20 LAB — BMP8+ANION GAP
Anion Gap: 14 mmol/L (ref 10.0–18.0)
BUN/Creatinine Ratio: 13 (ref 9–23)
BUN: 9 mg/dL (ref 6–24)
CO2: 27 mmol/L (ref 20–29)
Calcium: 9.8 mg/dL (ref 8.7–10.2)
Chloride: 102 mmol/L (ref 96–106)
Creatinine, Ser: 0.68 mg/dL (ref 0.57–1.00)
Glucose: 98 mg/dL (ref 70–99)
Potassium: 3 mmol/L — ABNORMAL LOW (ref 3.5–5.2)
Sodium: 143 mmol/L (ref 134–144)
eGFR: 102 mL/min/{1.73_m2} (ref >59)

## 2022-06-22 ENCOUNTER — Telehealth: Payer: Self-pay | Admitting: *Deleted

## 2022-06-22 NOTE — Chronic Care Management (AMB) (Signed)
  Care Coordination   Note   06/22/2022 Name: [name] MRN: [id number] DOB: 05-Feb-1966  Kathleen Stone is a 56 y.o. year old female who sees Masters, Joellen Jersey, DO for primary care. I reached out to Kathleen Stone by phone today to offer care coordination services.  [name] was given information about Care Coordination services today including:   The Care Coordination services include support from the care team which includes your Nurse Coordinator, Clinical Social Worker, or Pharmacist.  The Care Coordination team is here to help remove barriers to the health concerns and goals most important to you. Care Coordination services are voluntary, and the patient may decline or stop services at any time by request to their care team member.   Care Coordination Consent Status: Patient agreed to services and verbal consent obtained.   Follow up plan:  Telephone appointment with care coordination team member scheduled for:  06/29/22  Encounter Outcome:  Pt. Scheduled  [place]  Direct Dial: [phone]

## 2022-06-27 NOTE — Progress Notes (Signed)
Internal Medicine Clinic Attending  Case discussed with Dr. Elliot Gurney  at the time of the visit.  We reviewed the resident's history and exam and pertinent patient test results.  I agree with the assessment, diagnosis, and plan of care documented in the resident's note.   MRI not completed as of 06/27/22.

## 2022-06-29 ENCOUNTER — Ambulatory Visit: Payer: Self-pay

## 2022-06-29 ENCOUNTER — Other Ambulatory Visit: Payer: Self-pay | Admitting: Internal Medicine

## 2022-06-29 ENCOUNTER — Telehealth: Payer: Self-pay

## 2022-06-29 DIAGNOSIS — I10 Essential (primary) hypertension: Secondary | ICD-10-CM

## 2022-06-29 NOTE — Patient Instructions (Addendum)
Visit Information  Thank you for taking time to visit with me today. Please don't hesitate to contact me if I can be of assistance to you.   Following are the goals we discussed today:   Goals Addressed             This Visit's Progress    I don't understand why I am losing weight.       Care Coordination Interventions:   Evaluation of current treatment plan and patient's adherence to plan as established by provider Reviewed medications with patient and discussed  Collaborated with Jon Billings RN regarding Care Plan Active listening / Reflection utilized  Emotional Support Provided Problem [place] strategies reviewed Verbalization of feelings encouraged  I recently had a conversation with Kathleen Stone, who expressed her concern about her weight loss. She has lost a significant amount of weight and is having difficulty keeping solid food down. However, she is able to consume protein drinks and chocolate pudding. During her recent visit to the ED on June 01, 2022, a CT scan revealed a pancreatic mass. She has scheduled further scans at [facility] for follow-up today and is also seeing a counselor to help manage other stressors in her life. Her next appointment with her counselor is scheduled for July 12, 2022, at 2 p.m. We plan to reassess her needs in a week.as,          Our next appointment is by telephone on 10/19 at 9 am  Please call the care guide team at [phone] if you need to cancel or reschedule your appointment.   If you are experiencing a Mental Health or [place] or need someone to talk to, please call 1-800-273-TALK (toll free, 24 hour hotline)  Patient verbalizes understanding of instructions and care plan provided today and agrees to view in [place]. Active MyChart status and patient understanding of how to access instructions and care plan via MyChart confirmed with patient.     Lazaro Arms RN, BSN, [facility]   Phone: [phone]

## 2022-06-29 NOTE — Patient Outreach (Signed)
  Care Coordination   Multidisciplinary Case Review Note    06/29/2022 Name: Kathleen Stone MRN: [id number] DOB: 12/02/1965  Kathleen Stone is a 57 y.o. year old female who sees Kathleen Stone, Kathleen Jersey, DO for primary care.  The  multidisciplinary care team met today to review patient care needs and barriers.  Discussed with Kathleen Arms, RN interventions to focus on with patient to address any education and support needs.  Patient has pending testing.     Goals Addressed             This Visit's Progress    COMPLETED: Care Coordniation Consultation       Care Coordination Interventions: Collaborated with Kathleen Arms, RN  regarding Care goals and interventions.  Advised use of generic interventions to provide tailored services to patient.            SDOH assessments and interventions completed:  Yes     Care Coordination Interventions Activated:  Yes    Care Coordination Interventions:  Yes, provided   Follow up plan: Follow up call scheduled for Kathleen Arms, RN to follow up next week.      Multidisciplinary Team Attendees:   Kathleen Arms, RN Kathleen Billings, RN  Kathleen Baseman, RN, MSN [facility] Care Management Care Management Coordinator Direct Line [phone]

## 2022-06-29 NOTE — Progress Notes (Signed)
Potassium low. Will plan to have patient return for lab only visit to recheck K. May need to start on K supplement if persistently low.

## 2022-06-29 NOTE — Patient Outreach (Addendum)
  Care Coordination   Initial Visit Note   06/29/2022 Name: Kathleen Stone MRN: [id number] DOB: September 08, 1966  Kathleen Stone is a 56 y.o. year old female who sees Masters, Joellen Jersey, DO for primary care. I spoke with  Kathleen Stone by phone today.  What matters to the patients health and wellness today?  I am concerned about my weight loss.    Goals Addressed             This Visit's Progress    I don't understand why I am losing weight.       Care Coordination Interventions:   Evaluation of current treatment plan and patient's adherence to plan as established by provider Reviewed medications with patient and discussed  Collaborated with Jon Billings RN regarding Care Plan Active listening / Reflection utilized  Emotional Support Provided Problem [place] strategies reviewed Verbalization of feelings encouraged  I recently had a conversation with Kathleen Stone, who expressed her concern about her weight loss. She has lost a significant amount of weight and is having difficulty keeping solid food down. However, she is able to consume protein drinks and chocolate pudding. During her recent visit to the ED on June 01, 2022, a CT scan revealed a pancreatic mass. She has scheduled further scans at [facility] for follow-up today and is also seeing a counselor to help manage other stressors in her life. Her next appointment with her counselor is scheduled for July 12, 2022, at 2 p.m. We plan to reassess her needs in a week.as,          SDOH assessments and interventions completed:  Yes  SDOH Interventions Today    Flowsheet Row Most Recent Value  SDOH Interventions   Food Insecurity Interventions Intervention Not Indicated  Transportation Interventions Intervention Not Indicated        Care Coordination Interventions Activated:  Yes  Care Coordination Interventions:  Yes, provided   Follow up plan: Follow up call scheduled for 10/19 9 am    Encounter  Outcome:  Pt. Visit Completed   Lazaro Arms RN, BSN, [facility]   Phone: [phone]

## 2022-07-02 NOTE — Progress Notes (Signed)
She is also not on any medications that would make K low.  Agree with plan to recheck.  She was supplemented at last visit when K was 2.6.

## 2022-07-05 ENCOUNTER — Ambulatory Visit: Payer: Self-pay

## 2022-07-05 NOTE — Patient Instructions (Signed)
Visit Information  Thank you for taking time to visit with me today. Please don't hesitate to contact me if I can be of assistance to you.   Following are the goals we discussed today:   Goals Addressed             This Visit's Progress    I don't understand why I am losing weight.       Care Coordination Interventions:   Evaluation of current treatment plan and patient's adherence to plan as established by provider Reviewed medications with patient and discussed  Collaborated with Jon Billings RN regarding [place] listening / Reflection utilized  Emotional Support Provided Problem [place] strategies reviewed Verbalization of feelings encouraged  I spoke with Ms. Brash today and she reported that she is doing about the same. She mentioned that she has lost an additional five pounds. Ms. Soldo continues to consume her protein drinks and pudding. She takes her prescribed medication but cut down the dose of the pancreas medication due to constipation. She takes six pills in the morning, six pills at noon, and three pills in the evening. She has not reported any lightheadedness, dizziness, or falls. However, she has trouble sleeping at night and compensates by taking naps during the day. During our last conversation, Ms. Mcniel mentioned that she had a brain scan and is waiting for the results. She is also waiting for a scan of her stomach. I advised her to continue to take her medications, drink some Pedialyte, and go to all of her appointments.        Our next appointment is by telephone on 11/10 at 930 am  Please call the care guide team at [phone] if you need to cancel or reschedule your appointment.   If you are experiencing a Mental Health or [place] or need someone to talk to, please call 1-800-273-TALK (toll free, 24 hour hotline)  Patient verbalizes understanding of instructions and care plan provided today and agrees to view in  [place]. Active MyChart status and patient understanding of how to access instructions and care plan via MyChart confirmed with patient.      Lazaro Arms RN, BSN, [facility]   Phone: [phone]

## 2022-07-05 NOTE — Patient Outreach (Signed)
  Care Coordination   Follow Up Visit Note   07/05/2022 Name: Kathleen Stone MRN: [id number] DOB: [birth year]  Kathleen Stone is a 56 y.o. year old female who sees Masters, Joellen Jersey, DO for primary care. I spoke with  Kathleen Stone by phone today.  What matters to the patients health and wellness today?  I am doing about the same.    Goals Addressed             This Visit's Progress    I don't understand why I am losing weight.       Care Coordination Interventions:   Evaluation of current treatment plan and patient's adherence to plan as established by provider Reviewed medications with patient and discussed  Collaborated with Jon Billings RN regarding [place] listening / Reflection utilized  Emotional Support Provided Problem [place] strategies reviewed Verbalization of feelings encouraged  I spoke with Kathleen Stone today and she reported that she is doing about the same. She mentioned that she has lost an additional five pounds. Kathleen Stone continues to consume her protein drinks and pudding. She takes her prescribed medication but cut down the dose of the pancreas medication due to constipation. She takes six pills in the morning, six pills at noon, and three pills in the evening. She has not reported any lightheadedness, dizziness, or falls. However, she has trouble sleeping at night and compensates by taking naps during the day. During our last conversation, Kathleen Stone mentioned that she had a brain scan and is waiting for the results. She is also waiting for a scan of her stomach. I advised her to continue to take her medications, drink some Pedialyte, and go to all of her appointments.        SDOH assessments and interventions completed:  No     Care Coordination Interventions Activated:  Yes  Care Coordination Interventions:  Yes, provided   Follow up plan: Follow up call scheduled for 11/10 930 am    Encounter Outcome:  Pt. Visit Completed    Lazaro Arms RN, BSN, [facility]   Phone: [phone]

## 2022-07-27 ENCOUNTER — Ambulatory Visit: Payer: Self-pay

## 2022-07-28 NOTE — Patient Outreach (Signed)
  Care Coordination   Follow Up Visit Note   07/27/22 Name: HENCHY MCCAULEY MRN: [id number] DOB: Jan 19, 1966  [name] is a 56 y.o. year old female who sees Masters, Joellen Jersey, DO for primary care. I spoke with  [name] by phone today.  What matters to the patients health and wellness today?  I am still losing weight    Goals Addressed             This Visit's Progress    I don't understand why I am losing weight.       Care Coordination Interventions:   Evaluation of current treatment plan and patient's adherence to plan as established by provider Reviewed medications with patient and discussed  Collaborated with Jon Billings RN regarding [place] listening / Reflection utilized  Emotional Support Provided Problem [place] strategies reviewed Verbalization of feelings encouraged  Ms. Detty has reported a weight loss of 7 lbs. Unfortunately, she could not undergo the pancreatic mass scan due to financial constraints, as the procedure costs $245. She is currently waiting to hear from social services regarding her disability. I advised her to discuss her ongoing weight loss and concerns with her physician. Furthermore, I informed her that I would be collaborating with my colleagues to explore any potential resources that could assist her.        SDOH assessments and interventions completed:  No     Care Coordination Interventions Activated:  Yes  Care Coordination Interventions:  Yes, provided   Follow up plan: Follow up call scheduled for 11/29  9 am    Encounter Outcome:  Pt. Visit Completed

## 2022-07-28 NOTE — Patient Instructions (Signed)
Visit Information  Thank you for taking time to visit with me today. Please don't hesitate to contact me if I can be of assistance to you.   Following are the goals we discussed today:   Goals Addressed             This Visit's Progress    I don't understand why I am losing weight.       Care Coordination Interventions:   Evaluation of current treatment plan and patient's adherence to plan as established by provider Reviewed medications with patient and discussed  Collaborated with Jon Billings RN regarding [place] listening / Reflection utilized  Emotional Support Provided Problem Kathleen Stone strategies reviewed Verbalization of feelings encouraged  Kathleen Stone has reported a weight loss of 7 lbs. Unfortunately, she could not undergo the pancreatic mass scan due to financial constraints, as the procedure costs $245. She is currently waiting to hear from social services regarding her disability. I advised her to discuss her ongoing weight loss and concerns with her physician. Furthermore, I informed her that I would be collaborating with my colleagues to explore any potential resources that could assist her.        Our next appointment is by telephone on 11/29 at 9 am  Please call the care guide team at [phone] if you need to cancel or reschedule your appointment.   If you are experiencing a Mental Health or [place] or need someone to talk to, please call 1-800-273-TALK (toll free, 24 hour hotline)  Patient verbalizes understanding of instructions and care plan provided today and agrees to view in [place]. Active MyChart status and patient understanding of how to access instructions and care plan via MyChart confirmed with patient.     Lazaro Arms RN, BSN, [facility]   Phone: [phone]

## 2022-07-30 ENCOUNTER — Emergency Department (HOSPITAL_BASED_OUTPATIENT_CLINIC_OR_DEPARTMENT_OTHER): Payer: Commercial Managed Care - HMO

## 2022-07-30 ENCOUNTER — Inpatient Hospital Stay (HOSPITAL_COMMUNITY): Payer: Commercial Managed Care - HMO

## 2022-07-30 ENCOUNTER — Inpatient Hospital Stay (HOSPITAL_BASED_OUTPATIENT_CLINIC_OR_DEPARTMENT_OTHER)
Admission: EM | Admit: 2022-07-30 | Discharge: 2022-08-03 | DRG: 423 | Disposition: A | Discharge status: Home / Self Care | Payer: Commercial Managed Care - HMO | Attending: Internal Medicine | Admitting: Internal Medicine

## 2022-07-30 ENCOUNTER — Encounter (HOSPITAL_BASED_OUTPATIENT_CLINIC_OR_DEPARTMENT_OTHER): Payer: Self-pay | Admitting: Emergency Medicine

## 2022-07-30 ENCOUNTER — Encounter (HOSPITAL_COMMUNITY): Payer: Self-pay

## 2022-07-30 ENCOUNTER — Other Ambulatory Visit: Payer: Self-pay

## 2022-07-30 DIAGNOSIS — K317 Polyp of stomach and duodenum: Secondary | ICD-10-CM | POA: Diagnosis not present

## 2022-07-30 DIAGNOSIS — N950 Postmenopausal bleeding: Secondary | ICD-10-CM | POA: Diagnosis not present

## 2022-07-30 DIAGNOSIS — R634 Abnormal weight loss: Secondary | ICD-10-CM | POA: Diagnosis not present

## 2022-07-30 DIAGNOSIS — Z801 Family history of malignant neoplasm of trachea, bronchus and lung: Secondary | ICD-10-CM

## 2022-07-30 DIAGNOSIS — M545 Low back pain, unspecified: Secondary | ICD-10-CM | POA: Diagnosis present

## 2022-07-30 DIAGNOSIS — G8929 Other chronic pain: Secondary | ICD-10-CM | POA: Diagnosis present

## 2022-07-30 DIAGNOSIS — Z91041 Radiographic dye allergy status: Secondary | ICD-10-CM

## 2022-07-30 DIAGNOSIS — G629 Polyneuropathy, unspecified: Secondary | ICD-10-CM | POA: Diagnosis present

## 2022-07-30 DIAGNOSIS — R630 Anorexia: Secondary | ICD-10-CM | POA: Diagnosis not present

## 2022-07-30 DIAGNOSIS — C719 Malignant neoplasm of brain, unspecified: Secondary | ICD-10-CM | POA: Diagnosis present

## 2022-07-30 DIAGNOSIS — D259 Leiomyoma of uterus, unspecified: Secondary | ICD-10-CM | POA: Diagnosis present

## 2022-07-30 DIAGNOSIS — Z79899 Other long term (current) drug therapy: Secondary | ICD-10-CM

## 2022-07-30 DIAGNOSIS — K3189 Other diseases of stomach and duodenum: Secondary | ICD-10-CM | POA: Diagnosis present

## 2022-07-30 DIAGNOSIS — Z6826 Body mass index (BMI) 26.0-26.9, adult: Secondary | ICD-10-CM

## 2022-07-30 DIAGNOSIS — Z808 Family history of malignant neoplasm of other organs or systems: Secondary | ICD-10-CM

## 2022-07-30 DIAGNOSIS — R1084 Generalized abdominal pain: Secondary | ICD-10-CM

## 2022-07-30 DIAGNOSIS — M47812 Spondylosis without myelopathy or radiculopathy, cervical region: Secondary | ICD-10-CM | POA: Diagnosis present

## 2022-07-30 DIAGNOSIS — G43909 Migraine, unspecified, not intractable, without status migrainosus: Secondary | ICD-10-CM | POA: Diagnosis present

## 2022-07-30 DIAGNOSIS — R112 Nausea with vomiting, unspecified: Secondary | ICD-10-CM

## 2022-07-30 DIAGNOSIS — K259 Gastric ulcer, unspecified as acute or chronic, without hemorrhage or perforation: Secondary | ICD-10-CM | POA: Diagnosis present

## 2022-07-30 DIAGNOSIS — E876 Hypokalemia: Secondary | ICD-10-CM | POA: Diagnosis present

## 2022-07-30 DIAGNOSIS — Z803 Family history of malignant neoplasm of breast: Secondary | ICD-10-CM

## 2022-07-30 DIAGNOSIS — Z8673 Personal history of transient ischemic attack (TIA), and cerebral infarction without residual deficits: Secondary | ICD-10-CM

## 2022-07-30 DIAGNOSIS — R197 Diarrhea, unspecified: Secondary | ICD-10-CM | POA: Diagnosis not present

## 2022-07-30 DIAGNOSIS — Z8249 Family history of ischemic heart disease and other diseases of the circulatory system: Secondary | ICD-10-CM

## 2022-07-30 DIAGNOSIS — K449 Diaphragmatic hernia without obstruction or gangrene: Secondary | ICD-10-CM | POA: Diagnosis present

## 2022-07-30 DIAGNOSIS — F17210 Nicotine dependence, cigarettes, uncomplicated: Secondary | ICD-10-CM | POA: Diagnosis present

## 2022-07-30 DIAGNOSIS — Z7952 Long term (current) use of systemic steroids: Secondary | ICD-10-CM | POA: Diagnosis not present

## 2022-07-30 DIAGNOSIS — Z800 Family history of malignant neoplasm of digestive organs: Secondary | ICD-10-CM | POA: Diagnosis not present

## 2022-07-30 DIAGNOSIS — I10 Essential (primary) hypertension: Secondary | ICD-10-CM | POA: Diagnosis present

## 2022-07-30 DIAGNOSIS — Z833 Family history of diabetes mellitus: Secondary | ICD-10-CM | POA: Diagnosis not present

## 2022-07-30 DIAGNOSIS — C252 Malignant neoplasm of tail of pancreas: Secondary | ICD-10-CM | POA: Diagnosis present

## 2022-07-30 DIAGNOSIS — K209 Esophagitis, unspecified without bleeding: Secondary | ICD-10-CM | POA: Diagnosis not present

## 2022-07-30 DIAGNOSIS — C258 Malignant neoplasm of overlapping sites of pancreas: Secondary | ICD-10-CM | POA: Diagnosis not present

## 2022-07-30 DIAGNOSIS — E43 Unspecified severe protein-calorie malnutrition: Secondary | ICD-10-CM | POA: Insufficient documentation

## 2022-07-30 DIAGNOSIS — K8689 Other specified diseases of pancreas: Secondary | ICD-10-CM | POA: Diagnosis not present

## 2022-07-30 DIAGNOSIS — Z86010 Personal history of colonic polyps: Secondary | ICD-10-CM | POA: Diagnosis not present

## 2022-07-30 DIAGNOSIS — C259 Malignant neoplasm of pancreas, unspecified: Secondary | ICD-10-CM

## 2022-07-30 DIAGNOSIS — E785 Hyperlipidemia, unspecified: Secondary | ICD-10-CM | POA: Diagnosis present

## 2022-07-30 DIAGNOSIS — Z720 Tobacco use: Secondary | ICD-10-CM | POA: Diagnosis present

## 2022-07-30 LAB — COMPREHENSIVE METABOLIC PANEL
ALT: 16 U/L (ref 0–44)
AST: 23 U/L (ref 15–41)
Albumin: 3.7 g/dL (ref 3.5–5.0)
Alkaline Phosphatase: 68 U/L (ref 38–126)
Anion gap: 13 (ref 5–15)
BUN: 9 mg/dL (ref 6–20)
CO2: 27 mmol/L (ref 22–32)
Calcium: 9.9 mg/dL (ref 8.9–10.3)
Chloride: 102 mmol/L (ref 98–111)
Creatinine, Ser: 0.57 mg/dL (ref 0.44–1.00)
GFR, Estimated: >60 mL/min (ref >60)
Glucose, Bld: 110 mg/dL — ABNORMAL HIGH (ref 70–99)
Potassium: 2.7 mmol/L — CL (ref 3.5–5.1)
Sodium: 142 mmol/L (ref 135–145)
Total Bilirubin: 1.3 mg/dL — ABNORMAL HIGH (ref 0.3–1.2)
Total Protein: 7 g/dL (ref 6.5–8.1)

## 2022-07-30 LAB — CBC
HCT: 46.7 % — ABNORMAL HIGH (ref 36.0–46.0)
Hemoglobin: 15.7 g/dL — ABNORMAL HIGH (ref 12.0–15.0)
MCH: 28.8 pg (ref 26.0–34.0)
MCHC: 33.6 g/dL (ref 30.0–36.0)
MCV: 85.5 fL (ref 80.0–100.0)
Platelets: 245 10*3/uL (ref 150–400)
RBC: 5.46 MIL/uL — ABNORMAL HIGH (ref 3.87–5.11)
RDW: 14.8 % (ref 11.5–15.5)
WBC: 13.2 10*3/uL — ABNORMAL HIGH (ref 4.0–10.5)
nRBC: 0 % (ref 0.0–0.2)

## 2022-07-30 LAB — MAGNESIUM: Magnesium: 1.8 mg/dL (ref 1.7–2.4)

## 2022-07-30 LAB — LIPASE, BLOOD: Lipase: <10 U/L — ABNORMAL LOW (ref 11–51)

## 2022-07-30 MED ORDER — ONDANSETRON HCL 4 MG/2ML IJ SOLN
4.0000 mg | Freq: Once | INTRAMUSCULAR | Status: AC
  Administered 2022-07-30: 4 mg via INTRAVENOUS
  Filled 2022-07-30: qty 2

## 2022-07-30 MED ORDER — SODIUM CHLORIDE 0.9 % IV BOLUS
1000.0000 mL | Freq: Once | INTRAVENOUS | Status: AC
  Administered 2022-07-30: 1000 mL via INTRAVENOUS

## 2022-07-30 MED ORDER — ONDANSETRON HCL 4 MG PO TABS: 4.0000 mg | ORAL_TABLET | Freq: Four times a day (QID) | ORAL | Status: DC | PRN

## 2022-07-30 MED ORDER — SODIUM CHLORIDE 0.9 % IV SOLN: Freq: Once | INTRAVENOUS | Status: AC

## 2022-07-30 MED ORDER — POTASSIUM CHLORIDE 10 MEQ/100ML IV SOLN
10.0000 meq | INTRAVENOUS | Status: AC
  Administered 2022-07-30 (×3): 10 meq via INTRAVENOUS
  Filled 2022-07-30 (×3): qty 100

## 2022-07-30 MED ORDER — ACETAMINOPHEN 325 MG PO TABS: 650.0000 mg | ORAL_TABLET | Freq: Four times a day (QID) | ORAL | Status: DC | PRN

## 2022-07-30 MED ORDER — PROMETHAZINE HCL 25 MG/ML IJ SOLN: 25.0000 mg | Freq: Four times a day (QID) | INTRAMUSCULAR | Status: DC | PRN

## 2022-07-30 MED ORDER — HEPARIN SODIUM (PORCINE) 5000 UNIT/ML IJ SOLN
5000.0000 [IU] | Freq: Three times a day (TID) | INTRAMUSCULAR | Status: DC
  Administered 2022-07-31 – 2022-08-01 (×5): 5000 [IU] via SUBCUTANEOUS
  Filled 2022-07-30 (×5): qty 1

## 2022-07-30 MED ORDER — LACTATED RINGERS IV SOLN: INTRAVENOUS | Status: DC

## 2022-07-30 MED ORDER — MORPHINE SULFATE (PF) 4 MG/ML IV SOLN
4.0000 mg | Freq: Once | INTRAVENOUS | Status: AC
  Administered 2022-07-30: 4 mg via INTRAVENOUS
  Filled 2022-07-30: qty 1

## 2022-07-30 MED ORDER — OXYCODONE HCL 5 MG PO TABS
5.0000 mg | ORAL_TABLET | ORAL | Status: DC | PRN
  Administered 2022-07-31 (×2): 5 mg via ORAL
  Filled 2022-07-30 (×2): qty 1

## 2022-07-30 MED ORDER — NICOTINE 21 MG/24HR TD PT24
21.0000 mg | MEDICATED_PATCH | Freq: Every day | TRANSDERMAL | Status: DC
  Filled 2022-07-30: qty 1

## 2022-07-30 MED ORDER — HYDRALAZINE HCL 20 MG/ML IJ SOLN: 5.0000 mg | INTRAMUSCULAR | Status: DC | PRN

## 2022-07-30 MED ORDER — GADOBUTROL 1 MMOL/ML IV SOLN
7.0000 mL | Freq: Once | INTRAVENOUS | Status: AC | PRN
  Administered 2022-07-30: 7 mL via INTRAVENOUS

## 2022-07-30 MED ORDER — ACETAMINOPHEN 650 MG RE SUPP: 650.0000 mg | Freq: Four times a day (QID) | RECTAL | Status: DC | PRN

## 2022-07-30 MED ORDER — POTASSIUM CHLORIDE CRYS ER 20 MEQ PO TBCR
40.0000 meq | EXTENDED_RELEASE_TABLET | Freq: Once | ORAL | Status: AC
  Administered 2022-07-30: 40 meq via ORAL
  Filled 2022-07-30: qty 2

## 2022-07-30 MED ORDER — MORPHINE SULFATE (PF) 2 MG/ML IV SOLN
2.0000 mg | INTRAVENOUS | Status: DC | PRN
  Administered 2022-08-02: 2 mg via INTRAVENOUS
  Filled 2022-07-30: qty 1

## 2022-07-30 MED ORDER — ONDANSETRON HCL 4 MG/2ML IJ SOLN
4.0000 mg | Freq: Four times a day (QID) | INTRAMUSCULAR | Status: DC | PRN
  Administered 2022-08-02 (×2): 4 mg via INTRAVENOUS
  Filled 2022-07-30: qty 2

## 2022-07-30 NOTE — Assessment & Plan Note (Signed)
-  Encourage cessation.   ?-This was discussed with the patient and should be reviewed on an ongoing basis.   ?-Patch ordered  ?

## 2022-07-30 NOTE — ED Triage Notes (Signed)
Pt arrives to ED with c/o abdominal cramping. The cramping is generalized and has been intermittent for six months. She reports significant weight loss. 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

## 2022-07-30 NOTE — Assessment & Plan Note (Addendum)
-  Continue atorvastatin; resume upon arrival at destination hospital

## 2022-07-30 NOTE — ED Provider Notes (Signed)
[facility] EMERGENCY DEPT Provider Note   CSN: [id number] Arrival date & time: 07/30/22  6160     History  Chief Complaint  Patient presents with   Abdominal Pain    Kathleen Stone is a 56 y.o. female.  The history is provided by the patient, a relative and medical records. No language interpreter was used.  Abdominal Pain Pain location:  Generalized Pain quality: aching and cramping   Pain radiates to:  Does not radiate Pain severity:  Severe Onset quality:  Gradual Duration:  1 week (longer but worse in last week) Timing:  Constant Progression:  Waxing and waning Chronicity:  Recurrent Relieved by:  Nothing Worsened by:  Palpation Associated symptoms: fatigue, nausea and vomiting   Associated symptoms: no chest pain, no chills, no constipation, no cough, no diarrhea, no dysuria, no fever and no shortness of breath        Home Medications Prior to Admission medications   Medication Sig Start Date End Date Taking? Authorizing Provider  albuterol (PROVENTIL HFA) 108 (90 Base) MCG/ACT inhaler Inhale 1-2 puffs into the lungs every 6 (six) hours as needed for wheezing or shortness of breath. 11/01/21   Atway, Rayann N, DO  atorvastatin (LIPITOR) 40 MG tablet Take 1 tablet (40 mg total) by mouth daily. 03/06/22   Wayland Denis, MD  famotidine (PEPCID) 10 MG tablet Take 1 tablet (10 mg total) by mouth 2 (two) times daily. 03/13/22   Masters, Katie, DO  metoprolol tartrate (LOPRESSOR) 25 MG tablet Take 1 tablet (25 mg total) by mouth 2 (two) times daily. Take 1 tablet 2 hours before your CT scan 05/10/22   Serita Butcher, MD  nicotine (NICODERM CQ - DOSED IN MG/24 HOURS) 21 mg/24hr patch Place 1 patch (21 mg total) onto the skin daily. 03/13/22 03/13/23  Masters, Katie, DO  nitroGLYCERIN (NITROSTAT) 0.4 MG SL tablet Place 1 tablet (0.4 mg total) under the tongue every 5 (five) minutes as needed for chest pain. 11/01/21 04/13/22  Atway, Rayann N, DO  ondansetron  (ZOFRAN) 4 MG tablet Take 1 tablet (4 mg total) by mouth every 8 (eight) hours as needed for nausea or vomiting. 06/19/22 06/19/23  Delene Ruffini, MD  Pancrelipase, Lip-Prot-Amyl, (PANCREAZE) 4200-14200 units CPEP Take 25,000 Units by mouth in the morning, at noon, and at bedtime. 06/19/22   Delene Ruffini, MD  potassium chloride SA (KLOR-CON M) 20 MEQ tablet Take 1 tablet (20 mEq total) by mouth daily for 3 days. 05/21/22 05/24/22  Curatolo, Adam, DO  predniSONE (DELTASONE) 50 MG tablet Take 1 tab 13 hrs before, 1 tab 7 hr before and 1 tab 1 hr before your CT scan 04/13/22   Jerline Pain, MD  pregabalin (LYRICA) 50 MG capsule Take 1 capsule (50 mg total) by mouth 3 (three) times daily. Start taking it twice daily for 1 week and increase to three times daily after that. 02/21/22 05/22/22  Idamae Schuller, MD      Allergies    Iohexol    Review of Systems   Review of Systems  Constitutional:  Positive for appetite change, fatigue and unexpected weight change. Negative for chills, diaphoresis and fever.  HENT:  Negative for congestion.   Respiratory:  Negative for cough, chest tightness, shortness of breath and wheezing.   Cardiovascular:  Positive for palpitations. Negative for chest pain and leg swelling.  Gastrointestinal:  Positive for abdominal pain, nausea and vomiting. Negative for constipation and diarrhea.  Genitourinary:  Positive for decreased urine volume.  Negative for dysuria and flank pain.  Musculoskeletal:  Negative for back pain.  Neurological:  Negative for headaches.  Psychiatric/Behavioral:  Negative for agitation.   All other systems reviewed and are negative.   Physical Exam Updated Vital Signs BP (!) 157/75 (BP Location: Right Arm)   Pulse (!) 102   Temp 97.9 F (36.6 C)   Resp 12   Ht '5\' 6"'$  (1.676 m)   Wt 74.8 kg   LMP 02/07/2016 (Exact Date)   SpO2 100%   BMI 26.63 kg/m  Physical Exam Vitals and nursing note reviewed.  Constitutional:      General: She is not  in acute distress.    Appearance: She is well-developed. She is not ill-appearing, toxic-appearing or diaphoretic.  HENT:     Head: Normocephalic and atraumatic.     Nose: Nose normal.     Mouth/Throat:     Mouth: Mucous membranes are dry.  Eyes:     Conjunctiva/sclera: Conjunctivae normal.  Cardiovascular:     Rate and Rhythm: Normal rate and regular rhythm.     Heart sounds: No murmur heard. Pulmonary:     Effort: Pulmonary effort is normal. No respiratory distress.     Breath sounds: Normal breath sounds. No wheezing, rhonchi or rales.  Chest:     Chest wall: No tenderness.  Abdominal:     Palpations: Abdomen is soft.     Tenderness: There is abdominal tenderness. There is no guarding or rebound.  Musculoskeletal:        General: No swelling or tenderness.     Cervical back: Neck supple.     Right lower leg: No edema.     Left lower leg: No edema.  Skin:    General: Skin is warm and dry.     Capillary Refill: Capillary refill takes less than 2 seconds.     Findings: No erythema or rash.  Neurological:     General: No focal deficit present.     Mental Status: She is alert.  Psychiatric:        Mood and Affect: Mood normal.     ED Results / Procedures / Treatments   Labs (all labs ordered are listed, but only abnormal results are displayed) Labs Reviewed  LIPASE, BLOOD - Abnormal; Notable for the following components:      Result Value   Lipase <10 (*)    All other components within normal limits  COMPREHENSIVE METABOLIC PANEL - Abnormal; Notable for the following components:   Potassium 2.7 (*)    Glucose, Bld 110 (*)    Total Bilirubin 1.3 (*)    All other components within normal limits  CBC - Abnormal; Notable for the following components:   WBC 13.2 (*)    RBC 5.46 (*)    Hemoglobin 15.7 (*)    HCT 46.7 (*)    All other components within normal limits  MAGNESIUM  URINALYSIS, ROUTINE W REFLEX MICROSCOPIC    EKG EKG Interpretation  Date/Time:  Monday  July 30 2022 11:30:37 EST Ventricular Rate:  85 PR Interval:  135 QRS Duration: 103 QT Interval:  413 QTC Calculation: 492 R Axis:   -8 Text Interpretation: Sinus rhythm LAE, consider biatrial enlargement Abnormal R-wave progression, early transition LVH with secondary repolarization abnormality Borderline prolonged QT interval when compared to prior, similar appearnce. No STEMI Confirmed by Antony Blackbird [phone]) on 07/30/2022 11:50:02 AM  Radiology CT ABDOMEN PELVIS WO CONTRAST  Result Date: 07/30/2022 CLINICAL DATA:  A 56 year old female  presents following diagnosis of pancreatic mass for further evaluation due to continued abdominal cramping and pain. * Tracking Code: BO * EXAM: CT ABDOMEN AND PELVIS WITHOUT CONTRAST TECHNIQUE: Multidetector CT imaging of the abdomen and pelvis was performed following the standard protocol without IV contrast. RADIATION DOSE REDUCTION: This exam was performed according to the departmental dose-optimization program which includes automated exposure control, adjustment of the mA and/or kV according to patient size and/or use of iterative reconstruction technique. COMPARISON:  w June 01, 2022 FINDINGS: Lower chest: Lung bases are clear. No effusion. No consolidative changes. Hepatobiliary: Post cholecystectomy. No focal, suspicious hepatic lesion within limitations of the current exam. Pancreas: Masslike appearance of the pancreatic body and neck may measure as much as 5.7 x 3.2 cm and is associated with peripheral pancreatic atrophy and underlying ductal dilation. There is some adjacent peripancreatic stranding. Suspect encasement of the celiac axis. Numerous upper abdominal venous collaterals are noted likely related to occlusion of the splenic portal confluence based on the appearance of this masslike area in the pancreatic parenchyma. Spleen: Normal. Adrenals/Urinary Tract: Adrenal glands are normal. Renal contours are smooth. No hydronephrosis. No  ureteral calculi. Stable appearance of intrarenal calculi, largest on the RIGHT measuring approximately 5 mm. Urinary bladder is collapsed. Stomach/Bowel: No acute gastrointestinal process. Appendix is normal. Distal stomach and proximal duodenum inseparable from the suspected mass in the pancreas. Vascular/Lymphatic: Aortic atherosclerosis. No sign of aneurysm. Smooth contour of the IVC. There is no gastrohepatic or hepatoduodenal ligament lymphadenopathy. No retroperitoneal or mesenteric lymphadenopathy. No pelvic sidewall lymphadenopathy. Atherosclerotic changes are mild with calcification. Vascular structures show limited assessment due to lack of intravenous contrast. 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 Other: No ascites. Musculoskeletal: No acute or significant osseous findings. IMPRESSION: 1. 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 Difficult to exclude the possibility of superimposed pancreatitis. Suggest follow-up with MRI with without contrast, GI/surgical referral with endoscopic evaluation when possible. 2. Stable appearance of intrarenal calculi, largest on the RIGHT measuring approximately 5 mm. 3. 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 4. Aortic atherosclerosis. Aortic Atherosclerosis (ICD10-I70.0). Electronically Signed   By: Zetta Bills M.D.   On: 07/30/2022 12:46    Procedures Procedures    Medications Ordered in ED Medications  potassium chloride 10 mEq in 100 mL IVPB (10 mEq Intravenous New Bag/Given 07/30/22 1338)  potassium chloride SA (KLOR-CON M) CR tablet 40 mEq (40 mEq Oral Given 07/30/22 1150)  sodium chloride 0.9 % bolus 1,000 mL (0 mLs Intravenous Stopped 07/30/22 1333)  morphine (PF) 4 MG/ML injection 4 mg (4 mg Intravenous Given 07/30/22 1226)  ondansetron (ZOFRAN) injection 4 mg (4 mg Intravenous Given  07/30/22 1226)  0.9 %  sodium chloride infusion ( Intravenous New Bag/Given 07/30/22 1335)    ED Course/ Medical Decision Making/ A&P                           Medical Decision Making Amount and/or Complexity of Data Reviewed Labs: ordered. Radiology: ordered.  Risk Prescription drug management. Decision regarding hospitalization.    Kathleen Stone is a 56 y.o. female with a past medical history significant for previous brain tumor, stroke, hyperlipidemia, hypertension, and recent diagnosis of a pancreatic mass who presents with worsening nausea, vomiting, abdominal pain, weight loss, feeling dehydrated, fatigue, and malaise.  According to patient and family, patient was diagnosed in  September with a pancreatic mass and was post to get MRI to further evaluate but she has not been able to get that scheduled due to insurance problems.  She reports that she has been having continued symptoms with nausea, vomiting, and pain on and off but over the last 2 days it has rapidly worsened.  Family says she is barely eating any intake including not tolerating supplemental shakes and baby food very well.  She is feeling very fatigued and tired and continues to have 8 out of 10 pain across her abdomen.  She reports she still having some bowel movements but less urine than normal.  Denies any dysuria.  Denies fevers or chills and denies any chest pain or shortness of breath.  Denies any COVID or flu symptoms but does report some heart racing and palpitations at times.  She says she is lost about 70 pounds in the last month or 2 and is very concerned.  On exam, lungs clear and chest nontender.  Abdomen was diffusely tender but bowel sounds were appreciated.  Back nontender.  Flanks nontender.  Patient had intact sensation strength and pulses in extremities and had symmetric smile.  Patient has dry mucous membranes.  Clinically I am very concerned that patient is having some worsening intra-abdominal  pathology such as complication from this partially worked up mass.  She has a contrast allergy so we will get a noncontrasted CT as well as labs.  Her initial potassium in triage was low so we will give IV potassium as she does not tolerate any oral potassium here.  We will give some pain medicine, nausea medicine, and fluids.  Due to her intolerance of p.o. and worsening pain with weight loss and worsening hypokalemia, I do anticipate she will need admission when work-up is completed.  1:24 PM Patient's imaging has begun to return.  It does appear that there is a worsening pancreatic mass that is causing some chronic central mesenteric venous occlusion with encasement of the vessel.  Otherwise shows inflammation concerning for possible pancreatitis.  Lipase is undetectable and magnesium is normal however with the potassium being decreased, inability to tolerate p.o., leukocytosis, and worsening pancreatic mass, she will need admission.  We will call oncology and due to this possible thrombus will call vascular surgery.  I spoke with Dr. Donzetta Matters who reviewed the images and does not feel this needs any anticoagulation or procedure at this time.  He suspect this is due to the mass location.  He will defer further management to the oncology and admission team.  We will call medicine as well for admission.           Final Clinical Impression(s) / ED Diagnoses Final diagnoses:  Pancreatic mass  Nausea and vomiting, unspecified vomiting type  Generalized abdominal pain  Hypokalemia    Clinical Impression: 1. Pancreatic mass   2. Nausea and vomiting, unspecified vomiting type   3. Generalized abdominal pain   4. Hypokalemia     Disposition: Admit  This note was prepared with assistance of Dragon voice recognition software. Occasional wrong-word or sound-a-like substitutions may have occurred due to the inherent limitations of voice recognition software.     Heddy Vidana, Gwenyth Allegra,  MD 07/30/22 [phone]

## 2022-07-30 NOTE — H&P (Signed)
History and Physical    Patient: Kathleen Stone EGB:[id number] DOB: [birth year] DOA: 07/30/2022 DOS: the patient was seen and examined on 07/30/2022 PCP: Christiana Fuchs, DO  Patient coming from: Home  Chief Complaint:  Chief Complaint  Patient presents with   Abdominal Pain   HPI: Kathleen Stone is a 56 y.o. 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  She had a pelvic US on 8/16 with fibroids, thickened endometrium, and possible polyp.  She underwent HCG on 9/15.  She was seen later that evening in the ER for severe abdominal pain.  She had a CT that showed a pancreatic mass that was likely unrelated to her presenting complaint and so she was discharged with plan for outpatient f/u.  She was last seen in clinic on 10/3 for nausea, diarrhea, and anorexia.  Abdominal MRI was ordered on 9/18 but she does not appear to have had this done.      She reports that she has had severe weight loss.  Symptoms started 3-4 months ago.  She doesn't feel like eating sometimes.  She has lost 70-80 pounds.  No dysphagia.  She is sweating now but no apparent night sweats.  +n/v starting last night. +diarrhea 3-4 times a day, liquid stools.  She was unable to pay for an MRI and so did not have it done.   Review of Systems: As mentioned in the history of present illness. All other systems reviewed and are negative. Past Medical History:  Diagnosis Date   Brain tumor (benign) (HCC)    Chronic lower back pain    Negative MRI of hips bilaterally, neg CT scan of A and P; MRI of T and L spine-> very mild deg changes from T4-5 and T7-8, cervical spondylotic changes at C5-6, very mild disc bulge at L2-3 without spinal stenosis, nl ESR, ANA, RF, HIV, TSH, and B12   DJD (degenerative joint disease) of cervical spine    Family history of adverse reaction to anesthesia    "sisters PONV"   Galactorrhea of both breasts 2009   R.  sided w/ benign papilloma excised in 4/09. L sided in 5/09   Gallstones    Hyperlipidemia    Hypertension    Migraine    "monthly" (11/15/2016)   Neuromuscular disorder ([place])    left arm and side of face    Neuropathy    Thought to be 2/2 nerve compression in the C-spine   Obesity    Personal history of adenomatous colonic polyp    09/2007 - diminutive adenoma ([facility])   PONV (postoperative nausea and vomiting)    Stroke ([place])    2021- brain tumor bx caused ? stroke-    Tobacco abuse    Past Surgical History:  Procedure Laterality Date   APPENDECTOMY     BRAIN BIOPSY     [facility]- 03-2020   BREAST SURGERY Right 12/2007   central duct excision   CHOLECYSTECTOMY N/A 12/08/2019   Procedure: LAPAROSCOPIC CHOLECYSTECTOMY;  Surgeon: Ralene Ok, MD;  Location: [place];  Service: General;  Laterality: N/A;   COLONOSCOPY  2009   COLONOSCOPY  09/14/2020   COLONOSCOPY W/ BIOPSIES AND POLYPECTOMY  09/2007   Dr. [name]. 2 small adenomatous polyps, internal hemorrhoids-Grade 1. Rec  routine colonoscopy at age 31.   COLONOSCOPY W/ BIOPSIES AND POLYPECTOMY     NASAL SEPTOPLASTY W/ TURBINOPLASTY Bilateral 04/2008   Septoplasty with bilateral inferior turbinate reduction; Dr.  Newman   SHOULDER OPEN ROTATOR CUFF REPAIR Right     after hurting it by moving heavy objecs   Social History:  reports that she has been smoking cigarettes. She has a 54.00 pack-year smoking history. She has never used smokeless tobacco. She reports that she does not drink alcohol and does not use drugs.  Allergies  Allergen Reactions   Iohexol Hives and Itching    Benadryl 42m PO one hour before iodinated contrast studies.  jkl    Family History  Problem Relation Age of Onset   Heart failure Mother    Diabetes Mother    Cancer Father        lung   Lung cancer Father    Breast cancer Other        aunt   Liver cancer Other        aunt   Cancer Maternal Aunt        breast cancer   Colon cancer Neg Hx    Colon  polyps Neg Hx    Esophageal cancer Neg Hx    Rectal cancer Neg Hx    Stomach cancer Neg Hx     Prior to Admission medications   Medication Sig Start Date End Date Taking? Authorizing Provider  albuterol (PROVENTIL HFA) 108 (90 Base) MCG/ACT inhaler Inhale 1-2 puffs into the lungs every 6 (six) hours as needed for wheezing or shortness of breath. 11/01/21   Atway, Rayann N, DO  atorvastatin (LIPITOR) 40 MG tablet Take 1 tablet (40 mg total) by mouth daily. 03/06/22   ZWayland Denis MD  famotidine (PEPCID) 10 MG tablet Take 1 tablet (10 mg total) by mouth 2 (two) times daily. 03/13/22   Masters, Katie, DO  metoprolol tartrate (LOPRESSOR) 25 MG tablet Take 1 tablet (25 mg total) by mouth 2 (two) times daily. Take 1 tablet 2 hours before your CT scan 05/10/22   HSerita Butcher MD  nicotine (NICODERM CQ - DOSED IN MG/24 HOURS) 21 mg/24hr patch Place 1 patch (21 mg total) onto the skin daily. 03/13/22 03/13/23  Masters, Katie, DO  nitroGLYCERIN (NITROSTAT) 0.4 MG SL tablet Place 1 tablet (0.4 mg total) under the tongue every 5 (five) minutes as needed for chest pain. 11/01/21 04/13/22  Atway, Rayann N, DO  ondansetron (ZOFRAN) 4 MG tablet Take 1 tablet (4 mg total) by mouth every 8 (eight) hours as needed for nausea or vomiting. 06/19/22 06/19/23  GDelene Ruffini MD  Pancrelipase, Lip-Prot-Amyl, (PANCREAZE) 4200-14200 units CPEP Take 25,000 Units by mouth in the morning, at noon, and at bedtime. 06/19/22   GDelene Ruffini MD  potassium chloride SA (KLOR-CON M) 20 MEQ tablet Take 1 tablet (20 mEq total) by mouth daily for 3 days. 05/21/22 05/24/22  Curatolo, Adam, DO  predniSONE (DELTASONE) 50 MG tablet Take 1 tab 13 hrs before, 1 tab 7 hr before and 1 tab 1 hr before your CT scan 04/13/22   SJerline Pain MD  pregabalin (LYRICA) 50 MG capsule Take 1 capsule (50 mg total) by mouth 3 (three) times daily. Start taking it twice daily for 1 week and increase to three times daily after that. 02/21/22 05/22/22  KIdamae Schuller MD    Physical Exam: Vitals:   07/30/22 1400 07/30/22 1415 07/30/22 1830 07/30/22 2223  BP: (!) 161/77 (!) 166/82 (!) 152/79 (!) 165/81  Pulse: 82 83 72 76  Resp: _0 Temp:  98 F (36.7 C) 98 F (36.7 C) 97.6 F (36.4 C)  TempSrc:  Oral Oral   SpO2: 97% 100% 96% 99%  Weight:      Height:       Constitutional: NAD, calm, comfortable Eyes: PERRL, lids and conjunctivae normal ENMT: Mucous membranes are moist. Posterior pharynx clear of any exudate or lesions.Normal dentition.  Neck: normal, supple, no masses, no thyromegaly Respiratory: clear to auscultation bilaterally, no wheezing, no crackles. Normal respiratory effort. No accessory muscle use.  Cardiovascular: Regular rate and rhythm, no murmurs / rubs / gallops. No extremity edema. 2+ pedal pulses. No carotid bruits.  Abdomen: no tenderness, no masses palpated. No hepatosplenomegaly. Bowel sounds positive.  Musculoskeletal: no clubbing / cyanosis. No joint deformity upper and lower extremities. Good ROM, no contractures. Normal muscle tone.  Skin: no rashes, lesions, ulcers. No induration Neurologic: CN 2-12 grossly intact. Sensation intact, DTR normal. Strength 5/5 in all 4.  Psychiatric: Normal judgment and insight. Alert and oriented x 3. Pt understandably upset about possible pancreatic mass / cancer diagnosis.  Data Reviewed: {Tip this will not be part of the note when signed- Document your independent interpretation of telemetry tracing, EKG, lab, Radiology test or any other diagnostic tests. Add any new diagnostic test ordered today. (Optional):[id number]}    CT AP today: IMPRESSION: 1. 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 Difficult to exclude the possibility of superimposed pancreatitis. Suggest follow-up with MRI with without contrast, GI/surgical referral with endoscopic evaluation  when possible. 2. Stable appearance of intrarenal calculi, largest on the RIGHT measuring approximately 5 mm. 3. 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 4. Aortic atherosclerosis     Latest Ref Rng & Units 07/30/2022    9:07 AM 06/19/2022   10:31 AM 06/01/2022    5:27 PM  CMP  Glucose 70 - 99 mg/dL 110  98  116   BUN 6 - 20 mg/dL _0 Creatinine 0.44 - 1.00 mg/dL 0.57  0.68  0.75   Sodium 135 - 145 mmol/L 142  143  142   Potassium 3.5 - 5.1 mmol/L 2.7  3.0  3.5   Chloride 98 - 111 mmol/L 102  102  102   CO2 22 - 32 mmol/L _1 Calcium 8.9 - 10.3 mg/dL 9.9  9.8  9.8   Total Protein 6.5 - 8.1 g/dL 7.0   7.2   Total Bilirubin 0.3 - 1.2 mg/dL 1.3   1.2   Alkaline Phos 38 - 126 U/L 68   72   AST 15 - 41 U/L 23   22   ALT 0 - 44 U/L 16   17       Latest Ref Rng & Units 07/30/2022    9:07 AM 06/01/2022    5:27 PM 05/21/2022    4:59 PM  CBC  WBC 4.0 - 10.5 K/uL 13.2  12.0  12.2   Hemoglobin 12.0 - 15.0 g/dL 15.7  15.0  14.1   Hematocrit 36.0 - 46.0 % 46.7  44.9  41.6   Platelets 150 - 400 K/uL 245  175  204      Assessment and Plan: * Pancreatic mass -Patient with pancreatic mass noted on imaging 2 months ago -She was scheduled for outpatient MRI but did not go due to her insurance company refusing to cover this. 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" -Will order MRI for further evaluation -Vascular was contacted and reports that [facility] is not needed, nothing to do from their standpoint -Oncology will consult -She may also benefit from GI consultation -This is an IMTS patient but will be admitted at [facility] given the significant concern for malignancy  Unintentional weight loss -Very likely related to pancreatic mass -Likely to benefit from nutrition consult once she is  more stable  Pilocytic astrocytoma ([place]) Follow up with NS: sounds like they are just following this for the moment as long as it doesn't enlarge. Most recent MRI in Oct at [facility] measured lesion to be 1.5 x 1.5 x 1.2 cm in size.  This seems very similar to the prior measurement Jan 2022 of 1.7 x 1.6 x 1.3 cm.  No hydrocephalus seen on most recent MRI either.   Hyperlipidemia -Continue atorvastatin; resume upon arrival at destination hospital  Essential hypertension -Continue Lopressor; resume upon arrival at destination hospital  Tobacco abuse -Encourage cessation.   -This was discussed with the patient and should be reviewed on an ongoing basis.   -Patch ordered       Advance Care Planning:   Code Status: Full Code  Consults: None  Family Communication: ***  Severity of Illness: The appropriate patient status for this patient is INPATIENT. Inpatient status is judged to be reasonable and necessary in order to provide the required intensity of service to ensure the patient's safety. The patient's presenting symptoms, physical exam findings, and initial radiographic and laboratory data in the context of their chronic comorbidities is felt to place them at high risk for further clinical deterioration. Furthermore, it is not anticipated that the patient will be medically stable for discharge from the hospital within 2 midnights of admission.   * I certify that at the point of admission it is my clinical judgment that the patient will require inpatient hospital care spanning beyond 2 midnights from the point of admission due to high intensity of service, high risk for further deterioration and high frequency of surveillance required.*  Author: Etta Quill., DO 07/30/2022 11:26 PM  For on call review [email or web address].

## 2022-07-30 NOTE — Assessment & Plan Note (Signed)
-  Continue Lopressor; resume upon arrival at destination hospital

## 2022-07-30 NOTE — Consult Note (Signed)
Initial Consultation Note - Virtual Consult   Patient: Kathleen Stone MOQ:[id number] DOB: 11-May-1966 PCP: Christiana Fuchs, DO DOA: 07/30/2022 DOS: the patient was seen and examined on 07/30/2022 Primary service: Tegeler, Gwenyth Allegra, *  Referring physician: Tegeler - ER Reason for consult: Abdominal mass in September.  Insurance would not approve MRI.  Lost 70 pounds.  CT with advanced pancreatic neoplasm.  Needs MRI.  Vascular does not need to intervene.  Given IVF but LFTs are fine.  Dr. Earlie Server prefers [facility], will consult (Dr. Benay Spice may consult at [facility]).   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" -Will order MRI for further evaluation -Vascular was contacted and reports that [facility] is not needed, nothing to do from their standpoint -Oncology will consult -She may also benefit from GI consultation -This is an IMTS patient but will be admitted at [facility] given the significant concern for malignancy  Unintentional weight loss -Very likely related to pancreatic mass -Likely to benefit from nutrition consult once she is more stable  Hyperlipidemia -Continue atorvastatin; resume upon arrival at destination hospital  Essential hypertension -Continue Lopressor; resume upon arrival at destination hospital  Tobacco abuse -Encourage cessation.   -This was discussed with the patient and should be reviewed on an ongoing basis.   -Patch ordered        HPI: Kathleen Stone is a 56 y.o. 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  She had a pelvic US on  8/16 with fibroids, thickened endometrium, and possible polyp.  She underwent HCG on 9/15.  She was seen later that evening in the ER for severe abdominal pain.  She had a CT that showed a pancreatic mass that was likely unrelated to her presenting complaint and so she was discharged with plan for outpatient f/u.  She was last seen in clinic on 10/3 for nausea, diarrhea, and anorexia.  Abdominal MRI was ordered on 9/18 but she does not appear to have had this done.     She reports that she has had big weight loss.  Symptoms started 3-4 months ago.  She doesn't feel like eating sometimes.  She has lost 70-80 pounds.  No dysphagia.  She is sweating now but no apparent night sweats.  +n/v starting last night. +diarrhea 3-4 times a day, liquid stools.  She was unable to pay for an MRI and so did not have it done.       Review of Systems: As mentioned in the history of present illness. All other systems reviewed and are negative. Past Medical History:  Diagnosis Date   Brain tumor (benign) (HCC)    Chronic lower back pain    Negative MRI of hips bilaterally, neg CT scan of A and P; MRI of T and L spine-> very mild deg changes from T4-5 and T7-8, cervical spondylotic changes at C5-6, very mild disc bulge at L2-3 without spinal stenosis, nl ESR, ANA, RF, HIV, TSH, and B12   DJD (degenerative joint disease) of cervical spine    Family history of adverse reaction to anesthesia    "sisters PONV"   Galactorrhea of both breasts 2009  R. sided w/ benign papilloma excised in 4/09. L sided in 5/09   Gallstones    Hyperlipidemia    Hypertension    Migraine    "monthly" (11/15/2016)   Neuromuscular disorder ([place])    left arm and side of face    Neuropathy    Thought to be 2/2 nerve compression in the C-spine   Obesity    Personal history of adenomatous colonic polyp    09/2007 - diminutive adenoma ([facility])   PONV (postoperative nausea and vomiting)    Stroke ([place])    2021- brain tumor bx caused ?  stroke-    Tobacco abuse    Past Surgical History:  Procedure Laterality Date   APPENDECTOMY     BRAIN BIOPSY     [facility]- 03-2020   BREAST SURGERY Right 12/2007   central duct excision   CHOLECYSTECTOMY N/A 12/08/2019   Procedure: LAPAROSCOPIC CHOLECYSTECTOMY;  Surgeon: Ralene Ok, MD;  Location: [place];  Service: General;  Laterality: N/A;   COLONOSCOPY  2009   COLONOSCOPY  09/14/2020   COLONOSCOPY W/ BIOPSIES AND POLYPECTOMY  09/2007   Dr. [name]. 2 small adenomatous polyps, internal hemorrhoids-Grade 1. Rec  routine colonoscopy at age 29.   COLONOSCOPY W/ BIOPSIES AND POLYPECTOMY     NASAL SEPTOPLASTY W/ TURBINOPLASTY Bilateral 04/2008   Septoplasty with bilateral inferior turbinate reduction; Dr. Lucia Gaskins   SHOULDER OPEN ROTATOR CUFF REPAIR Right     after hurting it by moving heavy objecs   Social History:  reports that she has been smoking cigarettes. She has a 54.00 pack-year smoking history. She has never used smokeless tobacco. She reports that she does not drink alcohol and does not use drugs.  Allergies  Allergen Reactions   Iohexol Hives and Itching    Benadryl 109m PO one hour before iodinated contrast studies.  jkl    Family History  Problem Relation Age of Onset   Heart failure Mother    Diabetes Mother    Cancer Father        lung   Lung cancer Father    Breast cancer Other        aunt   Liver cancer Other        aunt   Cancer Maternal Aunt        breast cancer   Colon cancer Neg Hx    Colon polyps Neg Hx    Esophageal cancer Neg Hx    Rectal cancer Neg Hx    Stomach cancer Neg Hx     Prior to Admission medications   Medication Sig Start Date End Date Taking? Authorizing Provider  albuterol (PROVENTIL HFA) 108 (90 Base) MCG/ACT inhaler Inhale 1-2 puffs into the lungs every 6 (six) hours as needed for wheezing or shortness of breath. 11/01/21   Atway, Rayann N, DO  atorvastatin (LIPITOR) 40 MG tablet Take 1 tablet (40 mg total) by mouth daily. 03/06/22    ZWayland Denis MD  famotidine (PEPCID) 10 MG tablet Take 1 tablet (10 mg total) by mouth 2 (two) times daily. 03/13/22   Masters, Katie, DO  metoprolol tartrate (LOPRESSOR) 25 MG tablet Take 1 tablet (25 mg total) by mouth 2 (two) times daily. Take 1 tablet 2 hours before your CT scan 05/10/22   HSerita Butcher MD  nicotine (NICODERM CQ - DOSED IN MG/24 HOURS) 21 mg/24hr patch Place 1 patch (21 mg total) onto the skin daily. 03/13/22 03/13/23  Masters, Katie, DO  nitroGLYCERIN (NITROSTAT) 0.4 MG SL tablet Place  1 tablet (0.4 mg total) under the tongue every 5 (five) minutes as needed for chest pain. 11/01/21 04/13/22  Atway, Rayann N, DO  ondansetron (ZOFRAN) 4 MG tablet Take 1 tablet (4 mg total) by mouth every 8 (eight) hours as needed for nausea or vomiting. 06/19/22 06/19/23  Delene Ruffini, MD  Pancrelipase, Lip-Prot-Amyl, (PANCREAZE) 4200-14200 units CPEP Take 25,000 Units by mouth in the morning, at noon, and at bedtime. 06/19/22   Delene Ruffini, MD  potassium chloride SA (KLOR-CON M) 20 MEQ tablet Take 1 tablet (20 mEq total) by mouth daily for 3 days. 05/21/22 05/24/22  Curatolo, Adam, DO  predniSONE (DELTASONE) 50 MG tablet Take 1 tab 13 hrs before, 1 tab 7 hr before and 1 tab 1 hr before your CT scan 04/13/22   Jerline Pain, MD  pregabalin (LYRICA) 50 MG capsule Take 1 capsule (50 mg total) by mouth 3 (three) times daily. Start taking it twice daily for 1 week and increase to three times daily after that. 02/21/22 05/22/22  Idamae Schuller, MD    Physical Exam: Vitals:   07/30/22 1150 07/30/22 1200 07/30/22 1400 07/30/22 1415  BP: 137/74 138/79 (!) 161/77 (!) 166/82  Pulse: 83 86 82 83  Resp: (!) 22 (!) _0 Temp:    98 F (36.7 C)  TempSrc:    Oral  SpO2: 98% 100% 97% 100%  Weight:      Height:        Physical exam completed by Paramedic with Facetime observation  General:  Appears mildly chronically ill Eyes:  EOMI, normal lids, iris ENT:  grossly normal hearing, lips & tongue,  mmm Neck:  no LAD, masses or thyromegaly Cardiovascular:  RRR, no m/r/g. No LE edema.  Respiratory:   CTA bilaterally with no wheezes/rales/rhonchi.  Normal respiratory effort. Abdomen:  soft, NT, ND Back:   normal alignment, no CVAT Skin:  no rash or induration seen on limited exam Musculoskeletal:  grossly normal tone BUE/BLE, good ROM, no bony abnormality Psychiatric:  blunted mood and affect, speech fluent and appropriate, AOx3 Neurologic:  CN 2-12 grossly intact, moves all extremities in coordinated fashion   Radiological Exams on Admission: Independently reviewed - see discussion in A/P where applicable  CT ABDOMEN PELVIS WO CONTRAST  Result Date: 07/30/2022 CLINICAL DATA:  A 56 year old female presents following diagnosis of pancreatic mass for further evaluation due to continued abdominal cramping and pain. * Tracking Code: BO * EXAM: CT ABDOMEN AND PELVIS WITHOUT CONTRAST TECHNIQUE: Multidetector CT imaging of the abdomen and pelvis was performed following the standard protocol without IV contrast. RADIATION DOSE REDUCTION: This exam was performed according to the departmental dose-optimization program which includes automated exposure control, adjustment of the mA and/or kV according to patient size and/or use of iterative reconstruction technique. COMPARISON:  w June 01, 2022 FINDINGS: Lower chest: Lung bases are clear. No effusion. No consolidative changes. Hepatobiliary: Post cholecystectomy. No focal, suspicious hepatic lesion within limitations of the current exam. Pancreas: Masslike appearance of the pancreatic body and neck may measure as much as 5.7 x 3.2 cm and is associated with peripheral pancreatic atrophy and underlying ductal dilation. There is some adjacent peripancreatic stranding. Suspect encasement of the celiac axis. Numerous upper abdominal venous collaterals are noted likely related to occlusion of the splenic portal confluence based on the appearance of this  masslike area in the pancreatic parenchyma. Spleen: Normal. Adrenals/Urinary Tract: Adrenal glands are normal. Renal contours are smooth. No hydronephrosis. No ureteral calculi. Stable  appearance of intrarenal calculi, largest on the RIGHT measuring approximately 5 mm. Urinary bladder is collapsed. Stomach/Bowel: No acute gastrointestinal process. Appendix is normal. Distal stomach and proximal duodenum inseparable from the suspected mass in the pancreas. Vascular/Lymphatic: Aortic atherosclerosis. No sign of aneurysm. Smooth contour of the IVC. There is no gastrohepatic or hepatoduodenal ligament lymphadenopathy. No retroperitoneal or mesenteric lymphadenopathy. No pelvic sidewall lymphadenopathy. Atherosclerotic changes are mild with calcification. Vascular structures show limited assessment due to lack of intravenous contrast. 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 Other: No ascites. Musculoskeletal: No acute or significant osseous findings. IMPRESSION: 1. 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 Difficult to exclude the possibility of superimposed pancreatitis. Suggest follow-up with MRI with without contrast, GI/surgical referral with endoscopic evaluation when possible. 2. Stable appearance of intrarenal calculi, largest on the RIGHT measuring approximately 5 mm. 3. 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 4. Aortic atherosclerosis. Aortic Atherosclerosis (ICD10-I70.0). Electronically Signed   By: Zetta Bills M.D.   On: 07/30/2022 12:46    EKG: Independently reviewed.  NSR with rate 85; nonspecific ST changes with no evidence of acute ischemia   Labs on Admission: I have personally reviewed the available labs and imaging studies at the time of the admission.  Pertinent labs:    K+ 2.7 Glucose 110 Bili 1.3 WBC 13.2    Family  Communication: None present; asked me to call her sister, Avaeh Ewer, [phone] and I spoke to her after the consult  Primary team communication: I spoke with the EDP at the time of the consult  Thank you very much for involving [place] in the care of your patient.  Author: Karmen Bongo, MD 07/30/2022 5:49 PM  For on call review [email or web address].

## 2022-07-30 NOTE — Assessment & Plan Note (Signed)
Follow up with NS: sounds like they are just following this for the moment as long as it doesn't enlarge. Most recent MRI in Oct at [facility] measured lesion to be 1.5 x 1.5 x 1.2 cm in size.  This seems very similar to the prior measurement Jan 2022 of 1.7 x 1.6 x 1.3 cm.  No hydrocephalus seen on most recent MRI either.

## 2022-07-30 NOTE — Assessment & Plan Note (Signed)
-  Very likely related to pancreatic mass -Likely to benefit from nutrition consult once she is more stable

## 2022-07-30 NOTE — Assessment & Plan Note (Addendum)
-  Patient with pancreatic mass noted on imaging 2 months ago -She was scheduled for outpatient MRI but did not go due to her insurance company refusing to cover this. 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

## 2022-07-31 DIAGNOSIS — R634 Abnormal weight loss: Secondary | ICD-10-CM | POA: Diagnosis not present

## 2022-07-31 DIAGNOSIS — K8689 Other specified diseases of pancreas: Secondary | ICD-10-CM

## 2022-07-31 LAB — CBC
HCT: 40.9 % (ref 36.0–46.0)
Hemoglobin: 13.5 g/dL (ref 12.0–15.0)
MCH: 29.2 pg (ref 26.0–34.0)
MCHC: 33 g/dL (ref 30.0–36.0)
MCV: 88.3 fL (ref 80.0–100.0)
Platelets: 165 10*3/uL (ref 150–400)
RBC: 4.63 MIL/uL (ref 3.87–5.11)
RDW: 15.4 % (ref 11.5–15.5)
WBC: 8 10*3/uL (ref 4.0–10.5)
nRBC: 0 % (ref 0.0–0.2)

## 2022-07-31 LAB — COMPREHENSIVE METABOLIC PANEL
ALT: 20 U/L (ref 0–44)
AST: 24 U/L (ref 15–41)
Albumin: 3 g/dL — ABNORMAL LOW (ref 3.5–5.0)
Alkaline Phosphatase: 55 U/L (ref 38–126)
Anion gap: 9 (ref 5–15)
BUN: 8 mg/dL (ref 6–20)
CO2: 27 mmol/L (ref 22–32)
Calcium: 9.1 mg/dL (ref 8.9–10.3)
Chloride: 106 mmol/L (ref 98–111)
Creatinine, Ser: 0.66 mg/dL (ref 0.44–1.00)
GFR, Estimated: >60 mL/min (ref >60)
Glucose, Bld: 85 mg/dL (ref 70–99)
Potassium: 3 mmol/L — ABNORMAL LOW (ref 3.5–5.1)
Sodium: 142 mmol/L (ref 135–145)
Total Bilirubin: 1.6 mg/dL — ABNORMAL HIGH (ref 0.3–1.2)
Total Protein: 5.8 g/dL — ABNORMAL LOW (ref 6.5–8.1)

## 2022-07-31 LAB — PROTIME-INR
INR: 1 (ref 0.8–1.2)
Prothrombin Time: 13.1 s (ref 11.4–15.2)

## 2022-07-31 MED ORDER — DIPHENHYDRAMINE HCL 25 MG PO CAPS: 50.0000 mg | ORAL_CAPSULE | Freq: Once | ORAL | Status: DC

## 2022-07-31 MED ORDER — DIPHENHYDRAMINE HCL 50 MG/ML IJ SOLN: 50.0000 mg | Freq: Once | INTRAMUSCULAR | Status: DC

## 2022-07-31 MED ORDER — POTASSIUM CHLORIDE 20 MEQ PO PACK
40.0000 meq | PACK | Freq: Once | ORAL | Status: AC
  Administered 2022-07-31: 40 meq via ORAL
  Filled 2022-07-31: qty 2

## 2022-07-31 MED ORDER — PREDNISONE 50 MG PO TABS
50.0000 mg | ORAL_TABLET | Freq: Four times a day (QID) | ORAL | Status: DC
  Administered 2022-07-31 – 2022-08-01 (×2): 50 mg via ORAL
  Filled 2022-07-31 (×2): qty 1

## 2022-07-31 NOTE — Progress Notes (Signed)
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  Patient was seen in the ED before with severe abdominal pain. CT showed a pancreatic mass . She was discharged with the plan for outpatient follow-up.  She was seen in the clinic on 06/19/2022 for nausea,  diarrhea and anorexia.  MRI abdomen was ordered but it did not happen due to insurance.  She has now presented with a severe weight loss, nausea night sweats for last 3 to 4 months.  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  General surgery, gastroenterology, oncology is consulted.  Assessment & Plan:   Principal Problem:   Pancreatic mass Active Problems:   Unintentional weight loss   Tobacco abuse   Essential hypertension   Hyperlipidemia   Pilocytic astrocytoma (HCC)  Pancreatic mass  Pancreatic adenocarcinoma: Patient presented with weight loss, anorexia, night sweats, and pancreatic mass. Patient was seen in the ED before, MRI was ordered but could not happen due to insurance. 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 Vascular was contacted and reports that anticoagulation is not needed,  no vascular intervention needed. Oncology GI and general surgery is consulted.   Unintentional weight loss Likely secondary to pancreatic mass. Nutritional consult.  History of pilocytic astrocytoma: Patient follows up with neurosurgery. Most recent MRI in 10/23 showed stable size.   No hydrocephalus seen on most recent MRI.   Hyperlipidemia: Continue atorvastatin.  Essential hypertension: Continue Lopressor  Tobacco Abuse Encourage cessation.   DVT  prophylaxis: Lovenox Code Status: Full code Family Communication: Family at bedside Disposition Plan:   Status is: Inpatient Remains inpatient appropriate because: Admitted for pancreatic mass needs work-up. General surgery, oncology, gastroenterology consulted.  Patient is not medically clear  Consultants:  General surgery Gastroenterology Oncology  Procedures:  CTabdomen and pelvis.  MRCP Antimicrobials: None  Subjective: Patient was seen and examined at bedside.  Overnight events noted. Patient reports feeling very anxious after she diagnosed with pancreatic mass. She reports night sweats, weight loss, decreased appetite.  She continues to smoke.   Objective: Vitals:   07/31/22 0527 07/31/22 0848 07/31/22 1030 07/31/22 1229  BP: (!) 141/75 (!) 153/62 139/65 (!) 146/60  Pulse: 89 76 66 70  Resp: '18 16 19 16  '$ Temp: 98.1 F (36.7 C) 97.8 F (36.6 C) 98.4 F (36.9 C) (!) 97.4 F (36.3 C)  TempSrc:  Oral Oral Oral  SpO2: 95% 100% 97% 100%  Weight:      Height:        Intake/Output Summary (Last 24 hours) at 07/31/2022 1429 Last data filed at 07/31/2022 0900 Gross per 24 hour  Intake 2193.5 ml  Output --  Net 2193.5 ml   Filed Weights   07/30/22 0859  Weight: 74.8 kg    Examination:  General exam: Appears comfortable, not in any acute distress.  Deconditioned Respiratory system: CTA bilaterally, respiratory effort normal, RR 15. Cardiovascular system: S1 & S2 heard, regular rate and rhythm, no murmur. Gastrointestinal system: Abdomen is soft, mildly tender, non distended, BS+ Central nervous system: Alert and oriented X 3. No focal neurological deficits. Extremities: No edema, no cyanosis, no clubbing. Skin: No rashes, lesions or ulcers Psychiatry: Judgement and insight appear normal. Mood & affect appropriate.  Data Reviewed: I have personally reviewed following labs and imaging studies  CBC: Recent Labs  Lab 07/30/22 0907 07/31/22 0514  WBC  13.2* 8.0  HGB 15.7* 13.5  HCT 46.7* 40.9  MCV 85.5 88.3  PLT 245 488   Basic Metabolic Panel: Recent Labs  Lab 07/30/22 0907 07/30/22 1106 07/31/22 0514  NA 142  --  142  K 2.7*  --  3.0*  CL 102  --  106  CO2 27  --  27  GLUCOSE 110*  --  85  BUN 9  --  8  CREATININE 0.57  --  0.66  CALCIUM 9.9  --  9.1  MG  --  1.8  --    GFR: Estimated Creatinine Clearance: 80.2 mL/min (by C-G formula based on SCr of 0.66 mg/dL). Liver Function Tests: Recent Labs  Lab 07/30/22 0907 07/31/22 0514  AST 23 24  ALT 16 20  ALKPHOS 68 55  BILITOT 1.3* 1.6*  PROT 7.0 5.8*  ALBUMIN 3.7 3.0*   Recent Labs  Lab 07/30/22 0907  LIPASE <10*   No results for input(s): "AMMONIA" in the last 168 hours. Coagulation Profile: Recent Labs  Lab 07/31/22 1255  INR 1.0   Cardiac Enzymes: No results for input(s): "CKTOTAL", "CKMB", "CKMBINDEX", "TROPONINI" in the last 168 hours. BNP (last 3 results) No results for input(s): "PROBNP" in the last 8760 hours. HbA1C: No results for input(s): "HGBA1C" in the last 72 hours. CBG: No results for input(s): "GLUCAP" in the last 168 hours. Lipid Profile: No results for input(s): "CHOL", "HDL", "LDLCALC", "TRIG", "CHOLHDL", "LDLDIRECT" in the last 72 hours. Thyroid Function Tests: No results for input(s): "TSH", "T4TOTAL", "FREET4", "T3FREE", "THYROIDAB" in the last 72 hours. Anemia Panel: No results for input(s): "VITAMINB12", "FOLATE", "FERRITIN", "TIBC", "IRON", "RETICCTPCT" in the last 72 hours. Sepsis Labs: No results for input(s): "PROCALCITON", "LATICACIDVEN" in the last 168 hours.  No results found for this or any previous visit (from the past 240 hour(s)).       Radiology Studies: MR ABDOMEN W WO CONTRAST  Result Date: 07/30/2022 CLINICAL DATA:  Pancreatic cancer, for staging EXAM: MRI ABDOMEN WITHOUT AND WITH CONTRAST TECHNIQUE: Multiplanar multisequence MR imaging of the abdomen was performed both before and after the  administration of intravenous contrast. CONTRAST:  19m GADAVIST GADOBUTROL 1 MMOL/ML IV SOLN COMPARISON:  CT abdomen/pelvis dated 07/30/2022 and 06/01/2022. FINDINGS: Lower chest: Lung bases are clear. Hepatobiliary: Liver is within normal limits. No suspicious/enhancing hepatic lesions. Status post cholecystectomy. No intrahepatic ductal dilatation. Common duct measures 8 mm. No choledocholithiasis is seen. Pancreas: 3.7 x 4.7 cm hypoenhancing mass in the pancreatic body/tail (series 20/image 39), compatible with pancreatic adenocarcinoma. Atrophy of distal pancreatic tail with associated ductal dilatation. Spleen:  Within normal limits. Adrenals/Urinary Tract:  Adrenal glands are within normal limits. Subcentimeter bilateral renal cysts, measuring up to 6 mm in the right upper kidney (series 4/image 12), benign (Bosniak I). No follow-up is recommended. No hydronephrosis. Stomach/Bowel: Stomach is within normal limits. Visualized bowel is grossly unremarkable. Vascular/Lymphatic:  No evidence of abdominal aortic aneurysm. Encasement of the proximal celiac artery (series 20/image 37). SMA is uninvolved. 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 Small upper abdominal lymph nodes on diffusion imaging (series 6/image 12), including a 7 mm short axis node in the porta hepatis (series 20/image 30), within the upper limits of normal. Other:  No abdominal ascites. Musculoskeletal: No focal osseous lesions. IMPRESSION: 3.7 x 4.7 cm hypoenhancing  mass in the pancreatic body/tail, compatible with pancreatic adenocarcinoma. Encasement of the proximal celiac artery. 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 No findings suspicious for metastatic disease in the abdomen. Electronically Signed   By: Julian Hy M.D.   On: 07/30/2022 23:57   MR 3D Recon At Scanner  Result Date: 07/30/2022 CLINICAL DATA:  Pancreatic cancer, for staging  EXAM: MRI ABDOMEN WITHOUT AND WITH CONTRAST TECHNIQUE: Multiplanar multisequence MR imaging of the abdomen was performed both before and after the administration of intravenous contrast. CONTRAST:  6m GADAVIST GADOBUTROL 1 MMOL/ML IV SOLN COMPARISON:  CT abdomen/pelvis dated 07/30/2022 and 06/01/2022. FINDINGS: Lower chest: Lung bases are clear. Hepatobiliary: Liver is within normal limits. No suspicious/enhancing hepatic lesions. Status post cholecystectomy. No intrahepatic ductal dilatation. Common duct measures 8 mm. No choledocholithiasis is seen. Pancreas: 3.7 x 4.7 cm hypoenhancing mass in the pancreatic body/tail (series 20/image 39), compatible with pancreatic adenocarcinoma. Atrophy of distal pancreatic tail with associated ductal dilatation. Spleen:  Within normal limits. Adrenals/Urinary Tract:  Adrenal glands are within normal limits. Subcentimeter bilateral renal cysts, measuring up to 6 mm in the right upper kidney (series 4/image 12), benign (Bosniak I). No follow-up is recommended. No hydronephrosis. Stomach/Bowel: Stomach is within normal limits. Visualized bowel is grossly unremarkable. Vascular/Lymphatic:  No evidence of abdominal aortic aneurysm. Encasement of the proximal celiac artery (series 20/image 37). SMA is uninvolved. 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 Small upper abdominal lymph nodes on diffusion imaging (series 6/image 12), including a 7 mm short axis node in the porta hepatis (series 20/image 30), within the upper limits of normal. Other:  No abdominal ascites. Musculoskeletal: No focal osseous lesions. IMPRESSION: 3.7 x 4.7 cm hypoenhancing mass in the pancreatic body/tail, compatible with pancreatic adenocarcinoma. Encasement of the proximal celiac artery. 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 No findings suspicious for metastatic disease in the abdomen. Electronically Signed   By:  SJulian HyM.D.   On: 07/30/2022 23:57   CT ABDOMEN PELVIS WO CONTRAST  Result Date: 07/30/2022 CLINICAL DATA:  A 56year old female presents following diagnosis of pancreatic mass for further evaluation due to continued abdominal cramping and pain. * Tracking Code: BO * EXAM: CT ABDOMEN AND PELVIS WITHOUT CONTRAST TECHNIQUE: Multidetector CT imaging of the abdomen and pelvis was performed following the standard protocol without IV contrast. RADIATION DOSE REDUCTION: This exam was performed according to the departmental dose-optimization program which includes automated exposure control, adjustment of the mA and/or kV according to patient size and/or use of iterative reconstruction technique. COMPARISON:  w June 01, 2022 FINDINGS: Lower chest: Lung bases are clear. No effusion. No consolidative changes. Hepatobiliary: Post cholecystectomy. No focal, suspicious hepatic lesion within limitations of the current exam. Pancreas: Masslike appearance of the pancreatic body and neck may measure as much as 5.7 x 3.2 cm and is associated with peripheral pancreatic atrophy and underlying ductal dilation. There is some adjacent peripancreatic stranding. Suspect encasement of the celiac axis. Numerous upper abdominal venous collaterals are noted likely related to occlusion of the splenic portal confluence based on the appearance of this masslike area in the pancreatic parenchyma. Spleen: Normal. Adrenals/Urinary Tract: Adrenal glands are normal. Renal contours are smooth. No hydronephrosis. No ureteral calculi. Stable appearance of intrarenal calculi, largest on the RIGHT measuring approximately 5 mm. Urinary bladder is collapsed. Stomach/Bowel: No acute gastrointestinal process. Appendix is normal. Distal stomach and proximal duodenum  inseparable from the suspected mass in the pancreas. Vascular/Lymphatic: Aortic atherosclerosis. No sign of aneurysm. Smooth contour of the IVC. There is no gastrohepatic or  hepatoduodenal ligament lymphadenopathy. No retroperitoneal or mesenteric lymphadenopathy. No pelvic sidewall lymphadenopathy. Atherosclerotic changes are mild with calcification. Vascular structures show limited assessment due to lack of intravenous contrast. 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 Other: No ascites. Musculoskeletal: No acute or significant osseous findings. IMPRESSION: 1. 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 Difficult to exclude the possibility of superimposed pancreatitis. Suggest follow-up with MRI with without contrast, GI/surgical referral with endoscopic evaluation when possible. 2. Stable appearance of intrarenal calculi, largest on the RIGHT measuring approximately 5 mm. 3. 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 4. Aortic atherosclerosis. Aortic Atherosclerosis (ICD10-I70.0). Electronically Signed   By: Zetta Bills M.D.   On: 07/30/2022 12:46    Scheduled Meds:  [START ON 08/01/2022] diphenhydrAMINE  50 mg Oral Once   Or   [START ON 08/01/2022] diphenhydrAMINE  50 mg Intravenous Once   heparin  5,000 Units Subcutaneous Q8H   nicotine  21 mg Transdermal Daily   predniSONE  50 mg Oral Q6H   Continuous Infusions:  lactated ringers 75 mL/hr at 07/31/22 0900     LOS: 1 day    Time spent: 50 mins    Kathleen Scantling, MD [facility]   If 7PM-7AM, please contact night-coverage

## 2022-07-31 NOTE — Consult Note (Addendum)
Consultation  Referring Provider:  [facility]/ Dwyane Dee Primary Care Physician:  Christiana Fuchs, DO Primary Gastroenterologist:  Dr. [name]  Reason for Consultation: New diagnosis pancreatic cancer  HPI: Kathleen Stone is a 56 y.o. female, known to Dr. [name] from prior colonoscopy done in 2021, who was admitted yesterday after presenting to the ER at [facility] with complaints of abdominal pain and significant weight loss. She had undergone CT of the abdomen pelvis in September 2023 when she had initially presented with abdominal pain to the ER.  This showed her to be status postcholecystectomy, no biliary ductal dilation, atrophy of the pancreatic tail and possible dilation of the distal pancreatic duct, there is ill-defined soft tissue thickening and stranding with inflammation at the body of the pancreas suspicious for an inflammatory pancreatic mass, MRI was recommended, also noted enlarged uterus with multiple fibroids.  Not clear exactly what happened in the interim but apparently per insurance would not cover or she could not afford MRI so this was not pursued. She says she has been having some symptoms with abdominal pain and poor appetite since about February March 2023, symptoms have progressed over the past 3 to 4 months, she says she little appetite and has lost a total of about 70 pounds.  She complains of pain across the upper mid abdomen which has not been severe but has been persistent. Regular nausea or vomiting but did have some vomiting over the past few days. Work-up in the ER yesterday with CT of the abdomen and pelvis without contrast-postcholecystectomy, no suspicious hepatic lesions, masslike appearance of the pancreatic body and neck may measure as much as 5.7 x 3.2 cm and associated with peripheral pancreatic atrophy and underlying ductal dilation mild adjacent peripancreatic stranding suspected encasement of celiac axis numerous upper abdominal collaterals likely related to  occlusion of the splenic portal confluence  MRI abdomen yesterday-liver within normal limits, no suspicious enhancing/hepatic lesions, status post cholecystectomy no intrahepatic ductal dilation CBD 8 mm 3.7 x 4.7 cm hypoenhancing mass in the pancreatic body tail compatible with pancreatic adenocarcinoma there is atrophy of the distal pancreatic tail with associated ductal dilation.  There is encasement of the proximal celiac artery, SMA not involved, occlusion with collateralization at the confluence of the SMV and portal vein.  All upper abdominal lymph nodes noted, fluting a 7 mm node at the porta hepatis.  Labs today-CEA/CA 19-9/AFP pending WBC 8.0/hemoglobin 13.5/hematocrit 40.9  Potassium 3.0/BUN 8/creatinine 0.66 Albumin 3.0 T. 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  Past Medical History:  Diagnosis Date   Brain tumor (benign) (HCC)    Chronic lower back pain    Negative MRI of hips bilaterally, neg CT scan of A and P; MRI of T and L spine-> very mild deg changes from T4-5 and T7-8, cervical spondylotic changes at C5-6, very mild disc bulge at L2-3 without spinal stenosis, nl ESR, ANA, RF, HIV, TSH, and B12   DJD (degenerative joint disease) of cervical spine    Family history of adverse reaction to anesthesia    "sisters PONV"   Galactorrhea of both breasts 2009   R. sided w/ benign papilloma excised in 4/09. L sided in 5/09   Gallstones    Hyperlipidemia    Hypertension    Migraine    "monthly" (11/15/2016)   Neuromuscular disorder ([place])    left arm and  side of face    Neuropathy    Thought to be 2/2 nerve compression in the C-spine   Obesity    Personal history of adenomatous colonic polyp    09/2007 - diminutive adenoma ([place])   PONV (postoperative nausea and vomiting)    Stroke ([place])    2021- brain tumor bx  caused ? stroke-    Tobacco abuse     Past Surgical History:  Procedure Laterality Date   APPENDECTOMY     BRAIN BIOPSY     [facility]- 03-2020   BREAST SURGERY Right 12/2007   central duct excision   CHOLECYSTECTOMY N/A 12/08/2019   Procedure: LAPAROSCOPIC CHOLECYSTECTOMY;  Surgeon: Ralene Ok, MD;  Location: [facility];  Service: General;  Laterality: N/A;   COLONOSCOPY  2009   COLONOSCOPY  09/14/2020   COLONOSCOPY W/ BIOPSIES AND POLYPECTOMY  09/2007   Dr. [name]. 2 small adenomatous polyps, internal hemorrhoids-Grade 1. Rec  routine colonoscopy at age 34.   COLONOSCOPY W/ BIOPSIES AND POLYPECTOMY     NASAL SEPTOPLASTY W/ TURBINOPLASTY Bilateral 04/2008   Septoplasty with bilateral inferior turbinate reduction; Dr. Lucia Gaskins   SHOULDER OPEN ROTATOR CUFF REPAIR Right     after hurting it by moving heavy objecs    Prior to Admission medications   Medication Sig Start Date End Date Taking? Authorizing Provider  norethindrone (AYGESTIN) 5 MG tablet Take 5 mg by mouth daily. 05/11/22  Yes [provider]  albuterol (PROVENTIL HFA) 108 (90 Base) MCG/ACT inhaler Inhale 1-2 puffs into the lungs every 6 (six) hours as needed for wheezing or shortness of breath. 11/01/21   Atway, Rayann N, DO  atorvastatin (LIPITOR) 40 MG tablet Take 1 tablet (40 mg total) by mouth daily. 03/06/22   Wayland Denis, MD  famotidine (PEPCID) 10 MG tablet Take 1 tablet (10 mg total) by mouth 2 (two) times daily. 03/13/22   Masters, Katie, DO  metoprolol tartrate (LOPRESSOR) 25 MG tablet Take 1 tablet (25 mg total) by mouth 2 (two) times daily. Take 1 tablet 2 hours before your CT scan 05/10/22   Serita Butcher, MD  nicotine (NICODERM CQ - DOSED IN MG/24 HOURS) 21 mg/24hr patch Place 1 patch (21 mg total) onto the skin daily. 03/13/22 03/13/23  Masters, Katie, DO  nitroGLYCERIN (NITROSTAT) 0.4 MG SL tablet Place 1 tablet (0.4 mg total) under the tongue every 5 (five) minutes as needed for chest pain. 11/01/21 04/13/22   Atway, Rayann N, DO  ondansetron (ZOFRAN) 4 MG tablet Take 1 tablet (4 mg total) by mouth every 8 (eight) hours as needed for nausea or vomiting. 06/19/22 06/19/23  Delene Ruffini, MD  Pancrelipase, Lip-Prot-Amyl, (PANCREAZE) 4200-14200 units CPEP Take 25,000 Units by mouth in the morning, at noon, and at bedtime. 06/19/22   Delene Ruffini, MD  potassium chloride SA (KLOR-CON M) 20 MEQ tablet Take 1 tablet (20 mEq total) by mouth daily for 3 days. 05/21/22 05/24/22  Curatolo, Adam, DO  predniSONE (DELTASONE) 50 MG tablet Take 1 tab 13 hrs before, 1 tab 7 hr before and 1 tab 1 hr before your CT scan 04/13/22   Jerline Pain, MD  pregabalin (LYRICA) 50 MG capsule Take 1 capsule (50 mg total) by mouth 3 (three) times daily. Start taking it twice daily for 1 week and increase to three times daily after that. 02/21/22 05/22/22  Idamae Schuller, MD    Current Facility-Administered Medications  Medication Dose Route Frequency Provider Last Rate Last Admin   acetaminophen (TYLENOL) tablet 650  mg  650 mg Oral Q6H PRN Karmen Bongo, MD       Or   acetaminophen (TYLENOL) suppository 650 mg  650 mg Rectal Q6H PRN Karmen Bongo, MD       [START ON 08/01/2022] diphenhydrAMINE (BENADRYL) capsule 50 mg  50 mg Oral Once Shawna Clamp, MD       Or   Derrill Memo ON 08/01/2022] diphenhydrAMINE (BENADRYL) injection 50 mg  50 mg Intravenous Once Shawna Clamp, MD       heparin injection 5,000 Units  5,000 Units Subcutaneous Lynne Logan, MD   5,000 Units at 07/31/22 2229   hydrALAZINE (APRESOLINE) injection 5 mg  5 mg Intravenous Q4H PRN Karmen Bongo, MD       lactated ringers infusion   Intravenous Continuous Karmen Bongo, MD 75 mL/hr at 07/31/22 0900 Infusion Verify at 07/31/22 0900   morphine (PF) 2 MG/ML injection 2 mg  2 mg Intravenous Q2H PRN Karmen Bongo, MD       nicotine (NICODERM CQ - dosed in mg/24 hours) patch 21 mg  21 mg Transdermal Daily Karmen Bongo, MD       ondansetron [facility]) tablet 4  mg  4 mg Oral Q6H PRN Karmen Bongo, MD       Or   ondansetron [facility]) injection 4 mg  4 mg Intravenous Q6H PRN Karmen Bongo, MD       oxyCODONE (Oxy IR/ROXICODONE) immediate release tablet 5 mg  5 mg Oral Q4H PRN Karmen Bongo, MD   5 mg at 07/31/22 0854   predniSONE (DELTASONE) tablet 50 mg  50 mg Oral Q6H Shawna Clamp, MD       promethazine (PHENERGAN) injection 25 mg  25 mg Intramuscular Q6H PRN Karmen Bongo, MD        Allergies as of 07/30/2022 - Review Complete 07/30/2022  Allergen Reaction Noted   Iohexol Hives and Itching 08/13/2007    Family History  Problem Relation Age of Onset   Heart failure Mother    Diabetes Mother    Cancer Father        lung   Lung cancer Father    Breast cancer Other        aunt   Liver cancer Other        aunt   Cancer Maternal Aunt        breast cancer   Colon cancer Neg Hx    Colon polyps Neg Hx    Esophageal cancer Neg Hx    Rectal cancer Neg Hx    Stomach cancer Neg Hx     Social History   Socioeconomic History   Marital status: Married    Spouse name: Not on file   Number of children: Not on file   Years of education: Not on file   Highest education level: Not on file  Occupational History   Occupation: unemployed - applied for disability  Tobacco Use   Smoking status: Every Day    Packs/day: 1.50    Years: 36.00    Total pack years: 54.00    Types: Cigarettes   Smokeless tobacco: Never  Vaping Use   Vaping Use: Former  Substance and Sexual Activity   Alcohol use: No    Alcohol/week: 0.0 standard drinks of alcohol   Drug use: No   Sexual activity: Yes    Partners: Female  Other Topics Concern   Not on file  Social History Narrative   Domestic Partner: female has emphysema   Smoked 2 packs/day  for 20+ years, down to 1/2ppd now, willing to try to quit    Alcohol use-no   Drug use-no   Regular exercise-no   Social Determinants of Health   Financial Resource Strain: Not on file  Food Insecurity: No  Food Insecurity (07/31/2022)   Hunger Vital Sign    Worried About Running Out of Food in the Last Year: Never true    Ran Out of Food in the Last Year: Never true  Transportation Needs: No Transportation Needs (07/31/2022)   PRAPARE - Hydrologist (Medical): No    Lack of Transportation (Non-Medical): No  Physical Activity: Not on file  Stress: Not on file  Social Connections: Not on file  Intimate Partner Violence: Not At Risk (07/31/2022)   Humiliation, Afraid, Rape, and Kick questionnaire    Fear of Current or Ex-Partner: No    Emotionally Abused: No    Physically Abused: No    Sexually Abused: No    Review of Systems: Pertinent positive and negative review of systems were noted in the above HPI section.  All other review of systems was otherwise negative.   Physical Exam: Vital signs in last 24 hours: Temp:  [97.6 F (36.4 C)-98.4 F (36.9 C)] 98.4 F (36.9 C) (11/14 1030) Pulse Rate:  [66-89] 66 (11/14 1030) Resp:  [9-22] 19 (11/14 1030) BP: (137-166)/(62-82) 139/65 (11/14 1030) SpO2:  [95 %-100 %] 97 % (11/14 1030) Last BM Date : 07/30/22 General:   Alert,  Well-developed, well-nourished, African-American female pleasant and cooperative in NAD-3 sisters at bedside Head:  Normocephalic and atraumatic. Eyes:  Sclera clear, no icterus.   Conjunctiva pink. Ears:  Normal auditory acuity. Nose:  No deformity, discharge,  or lesions. Mouth:  No deformity or lesions.   Neck:  Supple; no masses or thyromegaly. Lungs:  Clear throughout to auscultation.   No wheezes, crackles, or rhonchi. Heart:  Regular rate and rhythm; no murmurs, clicks, rubs,  or gallops. Abdomen:  Soft, no palpable mass or hepatosplenomegaly BS active, she is tender across the epigastrium and hypogastrium/upper abdomen no guarding Rectal: Not done Msk:  Symmetrical without gross deformities. . Pulses:  Normal pulses noted. Extremities:  Without clubbing or edema. Neurologic:   Alert and  oriented x4;  grossly normal neurologically. Skin:  Intact without significant lesions or rashes.. Psych:  Alert and cooperative. Normal mood and affect.  Intake/Output from previous day: 11/13 0701 - 11/14 0700 In: 2656.6 [I.V.:1270.2; IV Piggyback:1386.4] Out: -  Intake/Output this shift: Total I/O In: 587.2 [P.O.:240; I.V.:347.2] Out: -   Lab Results: Recent Labs    07/30/22 0907 07/31/22 0514  WBC 13.2* 8.0  HGB 15.7* 13.5  HCT 46.7* 40.9  PLT 245 165   BMET Recent Labs    07/30/22 0907 07/31/22 0514  NA 142 142  K 2.7* 3.0*  CL 102 106  CO2 27 27  GLUCOSE 110* 85  BUN 9 8  CREATININE 0.57 0.66  CALCIUM 9.9 9.1   LFT Recent Labs    07/31/22 0514  PROT 5.8*  ALBUMIN 3.0*  AST 24  ALT 20  ALKPHOS 55  BILITOT 1.6*   PT/INR No results for input(s): "LABPROT", "INR" in the last 72 hours. Hepatitis Panel No results for input(s): "HEPBSAG", "HCVAB", "HEPAIGM", "HEPBIGM" in the last 72 hours.   IMPRESSION:  #64 56 year old African-American female with several month history of decreased appetite, and significant weight loss of about 70 pounds by her report.  She has been  having upper abdominal pain which has not been severe but persistent  Initial imaging September 2023 concerning for inflammatory mass in the body tail of the pancreas Represented yesterday with progressive symptoms and CT imaging and MRI have been done. MRI is consistent with a pancreatic tail adenocarcinoma measuring 3.7 x 4.7 cm there is encasement of the proximal celiac artery and occlusion with collateralization at the confluence of the SMV and portal vein, she has upper abdominal lymph nodes noted including a 7 mm node at the porta hepatis. No evidence of biliary obstruction.  She needs a biopsy, will discuss CT-guided biopsy versus EUS with biopsy  #2 hypokalemia correcting #3 fibroid uterus #4 hypertension #5 history of hyperlipidemia #6 history of colon polyps and  up-to-date with colonoscopy   PLAN: Advance diet to regular diet at patient request Oncology to see today CT of the chest has been ordered to complete staging Will discuss CT-guided biopsy versus EUS with biopsy, not certain that EUS is necessary with MRI findings suggesting locally advanced disease and with abdominal lymph nodes present  Imaging findings were discussed with the patient and her family, indicated that we would arrange for biopsy, complete staging, then defer to oncology/Dr. Benay Spice for management  Amy Lennon PA-C 07/31/2022, 10:33 AM    Attending Physician Note   I have taken a history, reviewed the chart and examined the patient. I performed a substantive portion of this encounter, including complete performance of at least one of the key components, in conjunction with the APP. I agree with the APP's note, impression and recommendations with my edits. My additional impressions and recommendations are as follows.   Weight loss, decreased appetite and upper abdominal pain. CT and MRI findings: pancreatic mass with encasement of celiac artery, occlusion of the SMV and portal vein confluence compatible with advanced pancreatic adenocarcinoma.  Oncology consult. IR does not feel there is an adequate window for CT guided biopsy. EUS FNA scheduled for Thursday with Dr. Rush Landmark.    Lucio Edward, MD [facility] See [facility], [facility], for our on call provider

## 2022-07-31 NOTE — Progress Notes (Addendum)
MRI reveals: IMPRESSION: 3.7 x 4.7 cm hypoenhancing mass in the pancreatic body/tail, compatible with pancreatic adenocarcinoma.   Encasement of the proximal celiac artery. 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   No findings suspicious for metastatic disease in the abdomen.  Plan: Call general surgery in AM and see if she is at all a candidate for a whipple. Also ordering CA 19-9, CEA, and AFP

## 2022-08-01 ENCOUNTER — Inpatient Hospital Stay (HOSPITAL_COMMUNITY): Payer: Commercial Managed Care - HMO

## 2022-08-01 DIAGNOSIS — R1084 Generalized abdominal pain: Secondary | ICD-10-CM | POA: Diagnosis not present

## 2022-08-01 DIAGNOSIS — K8689 Other specified diseases of pancreas: Secondary | ICD-10-CM | POA: Diagnosis not present

## 2022-08-01 DIAGNOSIS — R112 Nausea with vomiting, unspecified: Secondary | ICD-10-CM | POA: Diagnosis not present

## 2022-08-01 DIAGNOSIS — E43 Unspecified severe protein-calorie malnutrition: Secondary | ICD-10-CM | POA: Insufficient documentation

## 2022-08-01 HISTORY — DX: Nausea with vomiting, unspecified: R11.2

## 2022-08-01 LAB — AFP TUMOR MARKER: AFP, Serum, Tumor Marker: 4.5 ng/mL (ref 0.0–9.2)

## 2022-08-01 LAB — CEA: CEA: 27.4 ng/mL — ABNORMAL HIGH (ref 0.0–4.7)

## 2022-08-01 LAB — POTASSIUM: Potassium: 3.3 mmol/L — ABNORMAL LOW (ref 3.5–5.1)

## 2022-08-01 LAB — CANCER ANTIGEN 19-9: CA 19-9: 265 U/mL — ABNORMAL HIGH (ref 0–35)

## 2022-08-01 MED ORDER — POTASSIUM CHLORIDE 20 MEQ PO PACK
40.0000 meq | PACK | Freq: Once | ORAL | Status: AC
  Administered 2022-08-01: 40 meq via ORAL
  Filled 2022-08-01 (×2): qty 2

## 2022-08-01 MED ORDER — POTASSIUM CHLORIDE 10 MEQ/100ML IV SOLN
10.0000 meq | INTRAVENOUS | Status: AC
  Administered 2022-08-01: 10 meq via INTRAVENOUS
  Filled 2022-08-01: qty 100

## 2022-08-01 MED ORDER — BOOST PLUS PO LIQD
237.0000 mL | Freq: Two times a day (BID) | ORAL | Status: DC
  Filled 2022-08-01 (×5): qty 237

## 2022-08-01 MED ORDER — METHYLPREDNISOLONE SODIUM SUCC 40 MG IJ SOLR
40.0000 mg | Freq: Once | INTRAMUSCULAR | Status: AC
  Administered 2022-08-01: 40 mg via INTRAVENOUS
  Filled 2022-08-01: qty 1

## 2022-08-01 MED ORDER — DIPHENHYDRAMINE HCL 50 MG/ML IJ SOLN: 50.0000 mg | Freq: Once | INTRAMUSCULAR | Status: DC

## 2022-08-01 MED ORDER — DIPHENHYDRAMINE HCL 50 MG/ML IJ SOLN: 50.0000 mg | Freq: Once | INTRAMUSCULAR | Status: AC

## 2022-08-01 MED ORDER — ADULT MULTIVITAMIN W/MINERALS CH
1.0000 | ORAL_TABLET | Freq: Every day | ORAL | Status: DC
  Administered 2022-08-01: 1 via ORAL
  Filled 2022-08-01: qty 1

## 2022-08-01 MED ORDER — IOHEXOL 300 MG/ML  SOLN
75.0000 mL | Freq: Once | INTRAMUSCULAR | Status: AC | PRN
  Administered 2022-08-01: 75 mL via INTRAVENOUS

## 2022-08-01 MED ORDER — PREDNISONE 50 MG PO TABS: 50.0000 mg | ORAL_TABLET | Freq: Four times a day (QID) | ORAL | Status: AC

## 2022-08-01 MED ORDER — DIPHENHYDRAMINE HCL 25 MG PO CAPS
50.0000 mg | ORAL_CAPSULE | Freq: Once | ORAL | Status: AC
  Administered 2022-08-01: 50 mg via ORAL

## 2022-08-01 MED ORDER — DIPHENHYDRAMINE HCL 25 MG PO CAPS
50.0000 mg | ORAL_CAPSULE | Freq: Once | ORAL | Status: DC
  Filled 2022-08-01: qty 2

## 2022-08-01 NOTE — Progress Notes (Addendum)
Patient ID: Kathleen Stone, female   DOB: 02-03-1966, 56 y.o.   MRN: [id number]    Progress Note   Subjective   Day # 2 CC; anorexia, weight loss, abdominal pain  Patient did not get CT of chest yesterday Tumor markers pending She says she feels about the same, happy to have regular diet but really not eating much of anything due to lack of appetite, some lower abdominal cramping today, has not had a bowel movement had been having some loose stools at home recently  INR 1.0 Fecal elastase pending   Objective   Vital signs in last 24 hours: Temp:  [97.4 F (36.3 C)-98.1 F (36.7 C)] 98 F (36.7 C) (11/15 0807) Pulse Rate:  [66-73] 66 (11/15 0807) Resp:  [16-19] 18 (11/15 0807) BP: (141-184)/(58-89) 148/86 (11/15 0807) SpO2:  [94 %-100 %] 100 % (11/15 0807) Last BM Date : 07/30/22 General:    African-American female in NAD Heart:  Regular rate and rhythm; no murmurs Lungs: Respirations even and unlabored, lungs CTA bilaterally Abdomen:  Soft,  nondistended, no palpable mass or hepatosplenomegaly, she has some tenderness in the low mid abdomen normal bowel sounds. Extremities:  Without edema. Neurologic:  Alert and oriented,  grossly normal neurologically. Psych:  Cooperative. Normal mood and affect.  Intake/Output from previous day: 11/14 0701 - 11/15 0700 In: 932.2 [P.O.:240; I.V.:692.2] Out: -  Intake/Output this shift: Total I/O In: 368.7 [I.V.:368.7] Out: -   Lab Results: Recent Labs    07/30/22 0907 07/31/22 0514  WBC 13.2* 8.0  HGB 15.7* 13.5  HCT 46.7* 40.9  PLT 245 165   BMET Recent Labs    07/30/22 0907 07/31/22 0514  NA 142 142  K 2.7* 3.0*  CL 102 106  CO2 27 27  GLUCOSE 110* 85  BUN 9 8  CREATININE 0.57 0.66  CALCIUM 9.9 9.1   LFT Recent Labs    07/31/22 0514  PROT 5.8*  ALBUMIN 3.0*  AST 24  ALT 20  ALKPHOS 55  BILITOT 1.6*   PT/INR Recent Labs    07/31/22 1255  LABPROT 13.1  INR 1.0    Studies/Results: MR ABDOMEN W  WO CONTRAST  Result Date: 07/30/2022 CLINICAL DATA:  Pancreatic cancer, for staging EXAM: MRI ABDOMEN WITHOUT AND WITH CONTRAST TECHNIQUE: Multiplanar multisequence MR imaging of the abdomen was performed both before and after the administration of intravenous contrast. CONTRAST:  7m GADAVIST GADOBUTROL 1 MMOL/ML IV SOLN COMPARISON:  CT abdomen/pelvis dated 07/30/2022 and 06/01/2022. FINDINGS: Lower chest: Lung bases are clear. Hepatobiliary: Liver is within normal limits. No suspicious/enhancing hepatic lesions. Status post cholecystectomy. No intrahepatic ductal dilatation. Common duct measures 8 mm. No choledocholithiasis is seen. Pancreas: 3.7 x 4.7 cm hypoenhancing mass in the pancreatic body/tail (series 20/image 39), compatible with pancreatic adenocarcinoma. Atrophy of distal pancreatic tail with associated ductal dilatation. Spleen:  Within normal limits. Adrenals/Urinary Tract:  Adrenal glands are within normal limits. Subcentimeter bilateral renal cysts, measuring up to 6 mm in the right upper kidney (series 4/image 12), benign (Bosniak I). No follow-up is recommended. No hydronephrosis. Stomach/Bowel: Stomach is within normal limits. Visualized bowel is grossly unremarkable. Vascular/Lymphatic:  No evidence of abdominal aortic aneurysm. Encasement of the proximal celiac artery (series 20/image 37). SMA is uninvolved. 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 Small upper abdominal lymph nodes on diffusion imaging (series 6/image 12), including a 7 mm short axis node in the porta hepatis (series 20/image 30), within  the upper limits of normal. Other:  No abdominal ascites. Musculoskeletal: No focal osseous lesions. IMPRESSION: 3.7 x 4.7 cm hypoenhancing mass in the pancreatic body/tail, compatible with pancreatic adenocarcinoma. Encasement of the proximal celiac artery. 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 No  findings suspicious for metastatic disease in the abdomen. Electronically Signed   By: Julian Hy M.D.   On: 07/30/2022 23:57   MR 3D Recon At Scanner  Result Date: 07/30/2022 CLINICAL DATA:  Pancreatic cancer, for staging EXAM: MRI ABDOMEN WITHOUT AND WITH CONTRAST TECHNIQUE: Multiplanar multisequence MR imaging of the abdomen was performed both before and after the administration of intravenous contrast. CONTRAST:  48m GADAVIST GADOBUTROL 1 MMOL/ML IV SOLN COMPARISON:  CT abdomen/pelvis dated 07/30/2022 and 06/01/2022. FINDINGS: Lower chest: Lung bases are clear. Hepatobiliary: Liver is within normal limits. No suspicious/enhancing hepatic lesions. Status post cholecystectomy. No intrahepatic ductal dilatation. Common duct measures 8 mm. No choledocholithiasis is seen. Pancreas: 3.7 x 4.7 cm hypoenhancing mass in the pancreatic body/tail (series 20/image 39), compatible with pancreatic adenocarcinoma. Atrophy of distal pancreatic tail with associated ductal dilatation. Spleen:  Within normal limits. Adrenals/Urinary Tract:  Adrenal glands are within normal limits. Subcentimeter bilateral renal cysts, measuring up to 6 mm in the right upper kidney (series 4/image 12), benign (Bosniak I). No follow-up is recommended. No hydronephrosis. Stomach/Bowel: Stomach is within normal limits. Visualized bowel is grossly unremarkable. Vascular/Lymphatic:  No evidence of abdominal aortic aneurysm. Encasement of the proximal celiac artery (series 20/image 37). SMA is uninvolved. 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 Small upper abdominal lymph nodes on diffusion imaging (series 6/image 12), including a 7 mm short axis node in the porta hepatis (series 20/image 30), within the upper limits of normal. Other:  No abdominal ascites. Musculoskeletal: No focal osseous lesions. IMPRESSION: 3.7 x 4.7 cm hypoenhancing mass in the pancreatic body/tail, compatible with  pancreatic adenocarcinoma. Encasement of the proximal celiac artery. 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 No findings suspicious for metastatic disease in the abdomen. Electronically Signed   By: SJulian HyM.D.   On: 07/30/2022 23:57   CT ABDOMEN PELVIS WO CONTRAST  Result Date: 07/30/2022 CLINICAL DATA:  A 56year old female presents following diagnosis of pancreatic mass for further evaluation due to continued abdominal cramping and pain. * Tracking Code: BO * EXAM: CT ABDOMEN AND PELVIS WITHOUT CONTRAST TECHNIQUE: Multidetector CT imaging of the abdomen and pelvis was performed following the standard protocol without IV contrast. RADIATION DOSE REDUCTION: This exam was performed according to the departmental dose-optimization program which includes automated exposure control, adjustment of the mA and/or kV according to patient size and/or use of iterative reconstruction technique. COMPARISON:  w June 01, 2022 FINDINGS: Lower chest: Lung bases are clear. No effusion. No consolidative changes. Hepatobiliary: Post cholecystectomy. No focal, suspicious hepatic lesion within limitations of the current exam. Pancreas: Masslike appearance of the pancreatic body and neck may measure as much as 5.7 x 3.2 cm and is associated with peripheral pancreatic atrophy and underlying ductal dilation. There is some adjacent peripancreatic stranding. Suspect encasement of the celiac axis. Numerous upper abdominal venous collaterals are noted likely related to occlusion of the splenic portal confluence based on the appearance of this masslike area in the pancreatic parenchyma. Spleen: Normal. Adrenals/Urinary Tract: Adrenal glands are normal. Renal contours are smooth. No hydronephrosis. No ureteral calculi. Stable appearance of intrarenal calculi, largest on the RIGHT  measuring approximately 5 mm. Urinary bladder is collapsed. Stomach/Bowel: No acute gastrointestinal  process. Appendix is normal. Distal stomach and proximal duodenum inseparable from the suspected mass in the pancreas. Vascular/Lymphatic: Aortic atherosclerosis. No sign of aneurysm. Smooth contour of the IVC. There is no gastrohepatic or hepatoduodenal ligament lymphadenopathy. No retroperitoneal or mesenteric lymphadenopathy. No pelvic sidewall lymphadenopathy. Atherosclerotic changes are mild with calcification. Vascular structures show limited assessment due to lack of intravenous contrast. 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 Other: No ascites. Musculoskeletal: No acute or significant osseous findings. IMPRESSION: 1. 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 Difficult to exclude the possibility of superimposed pancreatitis. Suggest follow-up with MRI with without contrast, GI/surgical referral with endoscopic evaluation when possible. 2. Stable appearance of intrarenal calculi, largest on the RIGHT measuring approximately 5 mm. 3. 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 4. Aortic atherosclerosis. Aortic Atherosclerosis (ICD10-I70.0). Electronically Signed   By: Zetta Bills M.D.   On: 07/30/2022 12:46       Assessment / Plan:    #56 56 year old African-American female with several month history of decreased appetite, weight loss total of about 70 pounds, some upper abdominal pain, not severe but persistent, has also developed some loose stools.  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  Hyperlipidemia #5.  History of colon polyps up-to-date with colonoscopy   Plan;  She needs to complete staging evaluation, CT chest  She is scheduled for EUS and pancreatic biopsy tomorrow 08/02/2022 with Dr. Rush Landmark.  P.o. after midnight.  Procedure was discussed in detail with the patient including indications risk and benefits and she is agreeable to proceed.  Oncology consult  Continue potassium repletion Will stop   Subcu heparin until 24 hours after biopsy    Principal Problem:   Pancreatic mass Active Problems:   Tobacco abuse   Essential hypertension   Hyperlipidemia   Pilocytic astrocytoma (HCC)   Unintentional weight loss     LOS: 2 days   Sian Joles PA-C 08/01/2022, 10:40 AM

## 2022-08-01 NOTE — H&P (View-Only) (Signed)
Patient ID: Kathleen Stone, female   DOB: [birth year], 56 y.o.   MRN: [id number]    Progress Note   Subjective   Day # 2 CC; anorexia, weight loss, abdominal pain  Patient did not get CT of chest yesterday Tumor markers pending She says she feels about the same, happy to have regular diet but really not eating much of anything due to lack of appetite, some lower abdominal cramping today, has not had a bowel movement had been having some loose stools at home recently  INR 1.0 Fecal elastase pending   Objective   Vital signs in last 24 hours: Temp:  [97.4 F (36.3 C)-98.1 F (36.7 C)] 98 F (36.7 C) (11/15 0807) Pulse Rate:  [66-73] 66 (11/15 0807) Resp:  [16-19] 18 (11/15 0807) BP: (141-184)/(58-89) 148/86 (11/15 0807) SpO2:  [94 %-100 %] 100 % (11/15 0807) Last BM Date : 07/30/22 General:    African-American female in NAD Heart:  Regular rate and rhythm; no murmurs Lungs: Respirations even and unlabored, lungs CTA bilaterally Abdomen:  Soft,  nondistended, no palpable mass or hepatosplenomegaly, she has some tenderness in the low mid abdomen normal bowel sounds. Extremities:  Without edema. Neurologic:  Alert and oriented,  grossly normal neurologically. Psych:  Cooperative. Normal mood and affect.  Intake/Output from previous day: 11/14 0701 - 11/15 0700 In: 932.2 [P.O.:240; I.V.:692.2] Out: -  Intake/Output this shift: Total I/O In: 368.7 [I.V.:368.7] Out: -   Lab Results: Recent Labs    07/30/22 0907 07/31/22 0514  WBC 13.2* 8.0  HGB 15.7* 13.5  HCT 46.7* 40.9  PLT 245 165   BMET Recent Labs    07/30/22 0907 07/31/22 0514  NA 142 142  K 2.7* 3.0*  CL 102 106  CO2 27 27  GLUCOSE 110* 85  BUN 9 8  CREATININE 0.57 0.66  CALCIUM 9.9 9.1   LFT Recent Labs    07/31/22 0514  PROT 5.8*  ALBUMIN 3.0*  AST 24  ALT 20  ALKPHOS 55  BILITOT 1.6*   PT/INR Recent Labs    07/31/22 1255  LABPROT 13.1  INR 1.0    Studies/Results: MR ABDOMEN W  WO CONTRAST  Result Date: 07/30/2022 CLINICAL DATA:  Pancreatic cancer, for staging EXAM: MRI ABDOMEN WITHOUT AND WITH CONTRAST TECHNIQUE: Multiplanar multisequence MR imaging of the abdomen was performed both before and after the administration of intravenous contrast. CONTRAST:  16m GADAVIST GADOBUTROL 1 MMOL/ML IV SOLN COMPARISON:  CT abdomen/pelvis dated 07/30/2022 and 06/01/2022. FINDINGS: Lower chest: Lung bases are clear. Hepatobiliary: Liver is within normal limits. No suspicious/enhancing hepatic lesions. Status post cholecystectomy. No intrahepatic ductal dilatation. Common duct measures 8 mm. No choledocholithiasis is seen. Pancreas: 3.7 x 4.7 cm hypoenhancing mass in the pancreatic body/tail (series 20/image 39), compatible with pancreatic adenocarcinoma. Atrophy of distal pancreatic tail with associated ductal dilatation. Spleen:  Within normal limits. Adrenals/Urinary Tract:  Adrenal glands are within normal limits. Subcentimeter bilateral renal cysts, measuring up to 6 mm in the right upper kidney (series 4/image 12), benign (Bosniak I). No follow-up is recommended. No hydronephrosis. Stomach/Bowel: Stomach is within normal limits. Visualized bowel is grossly unremarkable. Vascular/Lymphatic:  No evidence of abdominal aortic aneurysm. Encasement of the proximal celiac artery (series 20/image 37). SMA is uninvolved. 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 Small upper abdominal lymph nodes on diffusion imaging (series 6/image 12), including a 7 mm short axis node in the porta hepatis (series 20/image 30), within  the upper limits of normal. Other:  No abdominal ascites. Musculoskeletal: No focal osseous lesions. IMPRESSION: 3.7 x 4.7 cm hypoenhancing mass in the pancreatic body/tail, compatible with pancreatic adenocarcinoma. Encasement of the proximal celiac artery. 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 No  findings suspicious for metastatic disease in the abdomen. Electronically Signed   By: Julian Hy M.D.   On: 07/30/2022 23:57   MR 3D Recon At Scanner  Result Date: 07/30/2022 CLINICAL DATA:  Pancreatic cancer, for staging EXAM: MRI ABDOMEN WITHOUT AND WITH CONTRAST TECHNIQUE: Multiplanar multisequence MR imaging of the abdomen was performed both before and after the administration of intravenous contrast. CONTRAST:  81m GADAVIST GADOBUTROL 1 MMOL/ML IV SOLN COMPARISON:  CT abdomen/pelvis dated 07/30/2022 and 06/01/2022. FINDINGS: Lower chest: Lung bases are clear. Hepatobiliary: Liver is within normal limits. No suspicious/enhancing hepatic lesions. Status post cholecystectomy. No intrahepatic ductal dilatation. Common duct measures 8 mm. No choledocholithiasis is seen. Pancreas: 3.7 x 4.7 cm hypoenhancing mass in the pancreatic body/tail (series 20/image 39), compatible with pancreatic adenocarcinoma. Atrophy of distal pancreatic tail with associated ductal dilatation. Spleen:  Within normal limits. Adrenals/Urinary Tract:  Adrenal glands are within normal limits. Subcentimeter bilateral renal cysts, measuring up to 6 mm in the right upper kidney (series 4/image 12), benign (Bosniak I). No follow-up is recommended. No hydronephrosis. Stomach/Bowel: Stomach is within normal limits. Visualized bowel is grossly unremarkable. Vascular/Lymphatic:  No evidence of abdominal aortic aneurysm. Encasement of the proximal celiac artery (series 20/image 37). SMA is uninvolved. 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 Small upper abdominal lymph nodes on diffusion imaging (series 6/image 12), including a 7 mm short axis node in the porta hepatis (series 20/image 30), within the upper limits of normal. Other:  No abdominal ascites. Musculoskeletal: No focal osseous lesions. IMPRESSION: 3.7 x 4.7 cm hypoenhancing mass in the pancreatic body/tail, compatible with  pancreatic adenocarcinoma. Encasement of the proximal celiac artery. 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 No findings suspicious for metastatic disease in the abdomen. Electronically Signed   By: SJulian HyM.D.   On: 07/30/2022 23:57   CT ABDOMEN PELVIS WO CONTRAST  Result Date: 07/30/2022 CLINICAL DATA:  A 56year old female presents following diagnosis of pancreatic mass for further evaluation due to continued abdominal cramping and pain. * Tracking Code: BO * EXAM: CT ABDOMEN AND PELVIS WITHOUT CONTRAST TECHNIQUE: Multidetector CT imaging of the abdomen and pelvis was performed following the standard protocol without IV contrast. RADIATION DOSE REDUCTION: This exam was performed according to the departmental dose-optimization program which includes automated exposure control, adjustment of the mA and/or kV according to patient size and/or use of iterative reconstruction technique. COMPARISON:  w June 01, 2022 FINDINGS: Lower chest: Lung bases are clear. No effusion. No consolidative changes. Hepatobiliary: Post cholecystectomy. No focal, suspicious hepatic lesion within limitations of the current exam. Pancreas: Masslike appearance of the pancreatic body and neck may measure as much as 5.7 x 3.2 cm and is associated with peripheral pancreatic atrophy and underlying ductal dilation. There is some adjacent peripancreatic stranding. Suspect encasement of the celiac axis. Numerous upper abdominal venous collaterals are noted likely related to occlusion of the splenic portal confluence based on the appearance of this masslike area in the pancreatic parenchyma. Spleen: Normal. Adrenals/Urinary Tract: Adrenal glands are normal. Renal contours are smooth. No hydronephrosis. No ureteral calculi. Stable appearance of intrarenal calculi, largest on the RIGHT  measuring approximately 5 mm. Urinary bladder is collapsed. Stomach/Bowel: No acute gastrointestinal  process. Appendix is normal. Distal stomach and proximal duodenum inseparable from the suspected mass in the pancreas. Vascular/Lymphatic: Aortic atherosclerosis. No sign of aneurysm. Smooth contour of the IVC. There is no gastrohepatic or hepatoduodenal ligament lymphadenopathy. No retroperitoneal or mesenteric lymphadenopathy. No pelvic sidewall lymphadenopathy. Atherosclerotic changes are mild with calcification. Vascular structures show limited assessment due to lack of intravenous contrast. 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 Other: No ascites. Musculoskeletal: No acute or significant osseous findings. IMPRESSION: 1. 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 Difficult to exclude the possibility of superimposed pancreatitis. Suggest follow-up with MRI with without contrast, GI/surgical referral with endoscopic evaluation when possible. 2. Stable appearance of intrarenal calculi, largest on the RIGHT measuring approximately 5 mm. 3. 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 4. Aortic atherosclerosis. Aortic Atherosclerosis (ICD10-I70.0). Electronically Signed   By: Zetta Bills M.D.   On: 07/30/2022 12:46       Assessment / Plan:    #72 56 year old African-American female with several month history of decreased appetite, weight loss total of about 70 pounds, some upper abdominal pain, not severe but persistent, has also developed some loose stools.  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  Hyperlipidemia #5.  History of colon polyps up-to-date with colonoscopy   Plan;  She needs to complete staging evaluation, CT chest  She is scheduled for EUS and pancreatic biopsy tomorrow 08/02/2022 with Dr. Rush Landmark.  P.o. after midnight.  Procedure was discussed in detail with the patient including indications risk and benefits and she is agreeable to proceed.  Oncology consult  Continue potassium repletion Will stop   Subcu heparin until 24 hours after biopsy    Principal Problem:   Pancreatic mass Active Problems:   Tobacco abuse   Essential hypertension   Hyperlipidemia   Pilocytic astrocytoma (HCC)   Unintentional weight loss     LOS: 2 days   Marylyn Appenzeller PA-C 08/01/2022, 10:40 AM

## 2022-08-01 NOTE — Progress Notes (Signed)
Patient didn't take her 9pm Prednisone dose as given but pt was compliant with 5am dose. Discussed with patient this am about CT prep regarding her allergy to contrast dye but she stated she doesn't need the prednisone and clarified her allergies to dye as pruritic only when she urinates post dye administration. CT is aware and will clarify with am team either to proceed or postpone with scheduled CT. Pt is aware

## 2022-08-01 NOTE — Plan of Care (Signed)

## 2022-08-01 NOTE — Progress Notes (Addendum)
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  Patient was seen in the ED before with severe abdominal pain. CT showed a pancreatic mass . She was discharged with the plan for outpatient follow-up.  She was seen in the clinic on 06/19/2022 for nausea,  diarrhea and anorexia.  MRI abdomen was ordered but it did not happen due to insurance.  She has now presented with a severe weight loss, nausea night sweats for last 3 to 4 months.  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  General surgery, gastroenterology, oncology is consulted.  Assessment & Plan:   Principal Problem:   Pancreatic mass Active Problems:   Unintentional weight loss   Tobacco abuse   Essential hypertension   Hyperlipidemia   Pilocytic astrocytoma (HCC)  Pancreatic mass: Pancreatic adenocarcinoma: Patient presented with weight loss, anorexia, night sweats, and pancreatic mass. Patient was seen in the ED before, MRI was ordered but could not happen due to insurance. 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 Vascular was contacted and reports that anticoagulation is not needed,  no vascular intervention needed. Oncology GI and general surgery is consulted. CT chest ordered for staging. GI has evaluated , She is scheduled for EUS and pancreatic biopsy tomorrow 08/02/2022 with Dr. Doreene Eland Dr. Benay Spice consulted.  He will see patient tomorrow.  Unintentional weight loss Likely secondary to pancreatic mass. Nutritional consult.  History of pilocytic astrocytoma: Patient follows up with neurosurgery. Most recent MRI in 10/23 showed stable  size.   No hydrocephalus seen on most recent MRI.  Hyperlipidemia: Continue atorvastatin.  Essential hypertension: Continue Lopressor  Tobacco Abuse Encourage cessation.   DVT prophylaxis: Lovenox Code Status: Full code Family Communication: Family at bedside Disposition Plan:   Status is: Inpatient Remains inpatient appropriate because: Admitted for pancreatic mass needs work-up. General surgery, oncology, gastroenterology consulted.  Patient is not medically clear  Consultants:  General surgery Gastroenterology Oncology  Procedures:  CT abdomen and pelvis.  MRCP Antimicrobials: None  Subjective: Patient was seen and examined at bedside.  Overnight events noted. Patient reports feeling very anxious as she is diagnosed with pancreatic cancer. She reports night sweats, weight loss, decreased appetite.  She continues to smoke. She asked for regular diet.   Objective: Vitals:   08/01/22 0539 08/01/22 0739 08/01/22 0807 08/01/22 1304  BP: (!) 159/89 (!) 184/79 (!) 148/86 (!) 166/86  Pulse: 68 71 66 73  Resp: '16 19 18 19  '$ Temp: 97.7 F (36.5 C) 98.1 F (36.7 C) 98 F (36.7 C) 97.8 F (36.6 C)  TempSrc: Oral Oral Oral Oral  SpO2: 100% 100% 100% 100%  Weight:      Height:        Intake/Output Summary (Last 24 hours) at 08/01/2022 1416 Last data filed at 08/01/2022 1411 Gross per 24 hour  Intake 605.65 ml  Output --  Net 605.65 ml   Filed Weights   07/30/22 0859  Weight: 74.8 kg    Examination:  General exam: Appears comfortable, not in any acute distress, deconditioned. Respiratory system: CTA bilaterally, respiratory effort normal, RR 15. Cardiovascular system: S1-S2 heard, regular rate and rhythm, no murmur. Gastrointestinal system: Abdomen is soft, mildly tender, nondistended, BS+ Central nervous system: Alert  and oriented X 3. No focal neurological deficits. Extremities: No edema, no cyanosis, no clubbing. Skin: No rashes, lesions or  ulcers Psychiatry: Judgement and insight appear normal. Mood & affect appropriate.     Data Reviewed: I have personally reviewed following labs and imaging studies  CBC: Recent Labs  Lab 07/30/22 0907 07/31/22 0514  WBC 13.2* 8.0  HGB 15.7* 13.5  HCT 46.7* 40.9  MCV 85.5 88.3  PLT 245 144   Basic Metabolic Panel: Recent Labs  Lab 07/30/22 0907 07/30/22 1106 07/31/22 0514  NA 142  --  142  K 2.7*  --  3.0*  CL 102  --  106  CO2 27  --  27  GLUCOSE 110*  --  85  BUN 9  --  8  CREATININE 0.57  --  0.66  CALCIUM 9.9  --  9.1  MG  --  1.8  --    GFR: Estimated Creatinine Clearance: 80.2 mL/min (by C-G formula based on SCr of 0.66 mg/dL). Liver Function Tests: Recent Labs  Lab 07/30/22 0907 07/31/22 0514  AST 23 24  ALT 16 20  ALKPHOS 68 55  BILITOT 1.3* 1.6*  PROT 7.0 5.8*  ALBUMIN 3.7 3.0*   Recent Labs  Lab 07/30/22 0907  LIPASE <10*   No results for input(s): "AMMONIA" in the last 168 hours. Coagulation Profile: Recent Labs  Lab 07/31/22 1255  INR 1.0   Cardiac Enzymes: No results for input(s): "CKTOTAL", "CKMB", "CKMBINDEX", "TROPONINI" in the last 168 hours. BNP (last 3 results) No results for input(s): "PROBNP" in the last 8760 hours. HbA1C: No results for input(s): "HGBA1C" in the last 72 hours. CBG: No results for input(s): "GLUCAP" in the last 168 hours. Lipid Profile: No results for input(s): "CHOL", "HDL", "LDLCALC", "TRIG", "CHOLHDL", "LDLDIRECT" in the last 72 hours. Thyroid Function Tests: No results for input(s): "TSH", "T4TOTAL", "FREET4", "T3FREE", "THYROIDAB" in the last 72 hours. Anemia Panel: No results for input(s): "VITAMINB12", "FOLATE", "FERRITIN", "TIBC", "IRON", "RETICCTPCT" in the last 72 hours. Sepsis Labs: No results for input(s): "PROCALCITON", "LATICACIDVEN" in the last 168 hours.  No results found for this or any previous visit (from the past 240 hour(s)).       Radiology Studies: MR ABDOMEN W WO  CONTRAST  Result Date: 07/30/2022 CLINICAL DATA:  Pancreatic cancer, for staging EXAM: MRI ABDOMEN WITHOUT AND WITH CONTRAST TECHNIQUE: Multiplanar multisequence MR imaging of the abdomen was performed both before and after the administration of intravenous contrast. CONTRAST:  18m GADAVIST GADOBUTROL 1 MMOL/ML IV SOLN COMPARISON:  CT abdomen/pelvis dated 07/30/2022 and 06/01/2022. FINDINGS: Lower chest: Lung bases are clear. Hepatobiliary: Liver is within normal limits. No suspicious/enhancing hepatic lesions. Status post cholecystectomy. No intrahepatic ductal dilatation. Common duct measures 8 mm. No choledocholithiasis is seen. Pancreas: 3.7 x 4.7 cm hypoenhancing mass in the pancreatic body/tail (series 20/image 39), compatible with pancreatic adenocarcinoma. Atrophy of distal pancreatic tail with associated ductal dilatation. Spleen:  Within normal limits. Adrenals/Urinary Tract:  Adrenal glands are within normal limits. Subcentimeter bilateral renal cysts, measuring up to 6 mm in the right upper kidney (series 4/image 12), benign (Bosniak I). No follow-up is recommended. No hydronephrosis. Stomach/Bowel: Stomach is within normal limits. Visualized bowel is grossly unremarkable. Vascular/Lymphatic:  No evidence of abdominal aortic aneurysm. Encasement of the proximal celiac artery (series 20/image 37). SMA is uninvolved. 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 Small upper abdominal lymph nodes on diffusion imaging (series 6/image 12), including  a 7 mm short axis node in the porta hepatis (series 20/image 30), within the upper limits of normal. Other:  No abdominal ascites. Musculoskeletal: No focal osseous lesions. IMPRESSION: 3.7 x 4.7 cm hypoenhancing mass in the pancreatic body/tail, compatible with pancreatic adenocarcinoma. Encasement of the proximal celiac artery. 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 No  findings suspicious for metastatic disease in the abdomen. Electronically Signed   By: Julian Hy M.D.   On: 07/30/2022 23:57   MR 3D Recon At Scanner  Result Date: 07/30/2022 CLINICAL DATA:  Pancreatic cancer, for staging EXAM: MRI ABDOMEN WITHOUT AND WITH CONTRAST TECHNIQUE: Multiplanar multisequence MR imaging of the abdomen was performed both before and after the administration of intravenous contrast. CONTRAST:  44m GADAVIST GADOBUTROL 1 MMOL/ML IV SOLN COMPARISON:  CT abdomen/pelvis dated 07/30/2022 and 06/01/2022. FINDINGS: Lower chest: Lung bases are clear. Hepatobiliary: Liver is within normal limits. No suspicious/enhancing hepatic lesions. Status post cholecystectomy. No intrahepatic ductal dilatation. Common duct measures 8 mm. No choledocholithiasis is seen. Pancreas: 3.7 x 4.7 cm hypoenhancing mass in the pancreatic body/tail (series 20/image 39), compatible with pancreatic adenocarcinoma. Atrophy of distal pancreatic tail with associated ductal dilatation. Spleen:  Within normal limits. Adrenals/Urinary Tract:  Adrenal glands are within normal limits. Subcentimeter bilateral renal cysts, measuring up to 6 mm in the right upper kidney (series 4/image 12), benign (Bosniak I). No follow-up is recommended. No hydronephrosis. Stomach/Bowel: Stomach is within normal limits. Visualized bowel is grossly unremarkable. Vascular/Lymphatic:  No evidence of abdominal aortic aneurysm. Encasement of the proximal celiac artery (series 20/image 37). SMA is uninvolved. 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 Small upper abdominal lymph nodes on diffusion imaging (series 6/image 12), including a 7 mm short axis node in the porta hepatis (series 20/image 30), within the upper limits of normal. Other:  No abdominal ascites. Musculoskeletal: No focal osseous lesions. IMPRESSION: 3.7 x 4.7 cm hypoenhancing mass in the pancreatic body/tail, compatible with  pancreatic adenocarcinoma. Encasement of the proximal celiac artery. 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 No findings suspicious for metastatic disease in the abdomen. Electronically Signed   By: SJulian HyM.D.   On: 07/30/2022 23:57    Scheduled Meds:  diphenhydrAMINE  50 mg Oral Once   Or   diphenhydrAMINE  50 mg Intravenous Once   lactose free nutrition  237 mL Oral BID BM   multivitamin with minerals  1 tablet Oral Daily   nicotine  21 mg Transdermal Daily   predniSONE  50 mg Oral Q6H   Continuous Infusions:  lactated ringers 75 mL/hr at 08/01/22 0800   potassium chloride Stopped (08/01/22 1226)     LOS: 2 days    Time spent: 35 mins    Milt Coye, MD [facility]   If 7PM-7AM, please contact night-coverage

## 2022-08-01 NOTE — Progress Notes (Signed)
Initial Nutrition Assessment  DOCUMENTATION CODES:   Severe malnutrition in context of chronic illness  INTERVENTION:  -Continue regular diet as tolerated -Encourage small, frequent meals, snacks and supplements -Provide Boost Plus Chocolate BID (350kcal, 14g protein/bottle) -Start daily MVI for taste changes  NUTRITION DIAGNOSIS:  Severe Malnutrition related to chronic illness as evidenced by percent weight loss, energy intake < or equal to 75% for > or equal to 1 month, moderate fat depletion, severe muscle depletion.   GOAL:  Patient will meet greater than or equal to 90% of their needs  MONITOR:  PO intake, Supplement acceptance  REASON FOR ASSESSMENT:  Malnutrition Screening Tool    ASSESSMENT:  Pt is a 56yo F with PMH of  HTN, HLD, pilocytic astrocytoma, and neuropathy presenting with abdominal pain. Pancreatic mass found on CT, admitted for workup.  Visited pt at bedside this afternoon. She reports poor po intake for the last 5 months due to early satiety, taste changes and just poor appetite. In this time pt reports a significant 27.8% unintended weight loss. NFPE shows moderate fat loss and severe muscle wasting. Pt meets ASPEN criteria for severe protein calorie malnutrition r/t chronic illness.     Wt Readings from Last 10 Encounters:  07/30/22 74.8 kg  06/19/22 86.1 kg  06/01/22 88.9 kg  05/23/22 92 kg  05/21/22 91.3 kg  05/02/22 96.3 kg  04/13/22 98.7 kg  04/05/22 100 kg  03/13/22 99.4 kg  02/21/22 103.6 kg   Recommend starting daily MVI for taste changes. Also offered ONS, pt agreeable to chocolate BID. Boost Plus BID will provide 350kcal and 14g protein/bottle. Encouraged small, frequent meals, snacks and supplements throughout the day to meet estimated nutrient needs.   Medications reviewed and include: prednisone, LR, KCl  Labs reviewed: K:3.0  NUTRITION - FOCUSED PHYSICAL EXAM:  Flowsheet Row Most Recent Value  Orbital Region Moderate depletion   Upper Arm Region Severe depletion  Thoracic and Lumbar Region Moderate depletion  Buccal Region Mild depletion  Temple Region Moderate depletion  Clavicle Bone Region Moderate depletion  Clavicle and Acromion Bone Region Moderate depletion  Scapular Bone Region Unable to assess  Dorsal Hand Mild depletion  Patellar Region Severe depletion  Anterior Thigh Region Severe depletion  Posterior Calf Region Severe depletion  Hair Reviewed  Eyes Reviewed  Mouth Reviewed  [c/o taste changes]  Skin Reviewed  Nails Reviewed       Diet Order:   Diet Order             Diet NPO time specified  Diet effective midnight           Diet regular Room service appropriate? Yes; Fluid consistency: Thin  Diet effective now                   EDUCATION NEEDS:   No education needs have been identified at this time  Skin:  Skin Assessment: Reviewed RN Assessment  Last BM:  11/13  Height:  Ht Readings from Last 1 Encounters:  07/30/22 '5\' 6"'$  (1.676 m)   Weight:  Wt Readings from Last 1 Encounters:  07/30/22 74.8 kg    BMI:  Body mass index is 26.63 kg/m.  Estimated Nutritional Needs:   Kcal:  2245-2620kcal  Protein:  90-115g  Fluid:  1870-2226m  KCandise Bowens MS, RD, LDN, CNSC See AMiON for contact information

## 2022-08-01 NOTE — Progress Notes (Signed)
  Transition of Care [facility]) Screening Note   Patient Details  Name: Kathleen Stone Date of Birth: [birth year]   Transition of Care [facility]) CM/SW Contact:    Vassie Moselle, LCSW Phone Number: 08/01/2022, 2:21 PM  TOC consulted for HH/DME needs. Currently no needs identified. Please re-consult should needs arise.   Transition of Care Department [facility]) has reviewed patient and no TOC needs have been identified at this time. We will continue to monitor patient advancement through interdisciplinary progression rounds. If new patient transition needs arise, please place a TOC consult.

## 2022-08-02 ENCOUNTER — Inpatient Hospital Stay (HOSPITAL_COMMUNITY): Payer: Commercial Managed Care - HMO | Admitting: Certified Registered Nurse Anesthetist

## 2022-08-02 ENCOUNTER — Encounter (HOSPITAL_COMMUNITY)
Admission: EM | Disposition: A | Discharge status: Home / Self Care | Payer: Self-pay | Source: Home / Self Care | Attending: Family Medicine

## 2022-08-02 ENCOUNTER — Encounter (HOSPITAL_COMMUNITY): Payer: Self-pay | Admitting: Internal Medicine

## 2022-08-02 ENCOUNTER — Encounter: Payer: Self-pay | Admitting: *Deleted

## 2022-08-02 DIAGNOSIS — R634 Abnormal weight loss: Secondary | ICD-10-CM | POA: Diagnosis not present

## 2022-08-02 DIAGNOSIS — K317 Polyp of stomach and duodenum: Secondary | ICD-10-CM

## 2022-08-02 DIAGNOSIS — R197 Diarrhea, unspecified: Secondary | ICD-10-CM

## 2022-08-02 DIAGNOSIS — K3189 Other diseases of stomach and duodenum: Secondary | ICD-10-CM | POA: Diagnosis not present

## 2022-08-02 DIAGNOSIS — Z800 Family history of malignant neoplasm of digestive organs: Secondary | ICD-10-CM

## 2022-08-02 DIAGNOSIS — K209 Esophagitis, unspecified without bleeding: Secondary | ICD-10-CM

## 2022-08-02 DIAGNOSIS — F17210 Nicotine dependence, cigarettes, uncomplicated: Secondary | ICD-10-CM

## 2022-08-02 DIAGNOSIS — K8689 Other specified diseases of pancreas: Secondary | ICD-10-CM | POA: Diagnosis not present

## 2022-08-02 DIAGNOSIS — N950 Postmenopausal bleeding: Secondary | ICD-10-CM

## 2022-08-02 DIAGNOSIS — C719 Malignant neoplasm of brain, unspecified: Secondary | ICD-10-CM | POA: Diagnosis not present

## 2022-08-02 DIAGNOSIS — Z803 Family history of malignant neoplasm of breast: Secondary | ICD-10-CM

## 2022-08-02 DIAGNOSIS — I10 Essential (primary) hypertension: Secondary | ICD-10-CM

## 2022-08-02 DIAGNOSIS — R630 Anorexia: Secondary | ICD-10-CM

## 2022-08-02 DIAGNOSIS — Z86010 Personal history of colonic polyps: Secondary | ICD-10-CM

## 2022-08-02 DIAGNOSIS — Z801 Family history of malignant neoplasm of trachea, bronchus and lung: Secondary | ICD-10-CM

## 2022-08-02 HISTORY — PX: ESOPHAGOGASTRODUODENOSCOPY (EGD) WITH PROPOFOL: SHX5813

## 2022-08-02 HISTORY — PX: EUS: SHX5427

## 2022-08-02 HISTORY — PX: FINE NEEDLE ASPIRATION: SHX5430

## 2022-08-02 HISTORY — PX: BIOPSY: SHX5522

## 2022-08-02 HISTORY — PX: POLYPECTOMY: SHX5525

## 2022-08-02 LAB — CBC
HCT: 40.9 % (ref 36.0–46.0)
Hemoglobin: 13.7 g/dL (ref 12.0–15.0)
MCH: 28.9 pg (ref 26.0–34.0)
MCHC: 33.5 g/dL (ref 30.0–36.0)
MCV: 86.3 fL (ref 80.0–100.0)
Platelets: 160 10*3/uL (ref 150–400)
RBC: 4.74 MIL/uL (ref 3.87–5.11)
RDW: 14.9 % (ref 11.5–15.5)
WBC: 14.5 10*3/uL — ABNORMAL HIGH (ref 4.0–10.5)
nRBC: 0 % (ref 0.0–0.2)

## 2022-08-02 LAB — COMPREHENSIVE METABOLIC PANEL
ALT: 21 U/L (ref 0–44)
AST: 28 U/L (ref 15–41)
Albumin: 2.9 g/dL — ABNORMAL LOW (ref 3.5–5.0)
Alkaline Phosphatase: 53 U/L (ref 38–126)
Anion gap: 6 (ref 5–15)
BUN: <5 mg/dL — ABNORMAL LOW (ref 6–20)
CO2: 28 mmol/L (ref 22–32)
Calcium: 9.3 mg/dL (ref 8.9–10.3)
Chloride: 107 mmol/L (ref 98–111)
Creatinine, Ser: 0.53 mg/dL (ref 0.44–1.00)
GFR, Estimated: >60 mL/min (ref >60)
Glucose, Bld: 95 mg/dL (ref 70–99)
Potassium: 3.2 mmol/L — ABNORMAL LOW (ref 3.5–5.1)
Sodium: 141 mmol/L (ref 135–145)
Total Bilirubin: 1.3 mg/dL — ABNORMAL HIGH (ref 0.3–1.2)
Total Protein: 6.1 g/dL — ABNORMAL LOW (ref 6.5–8.1)

## 2022-08-02 LAB — BILIRUBIN, DIRECT: Bilirubin, Direct: 0.3 mg/dL — ABNORMAL HIGH (ref 0.0–0.2)

## 2022-08-02 SURGERY — ULTRASOUND, UPPER GI TRACT, ENDOSCOPIC
Anesthesia: Monitor Anesthesia Care

## 2022-08-02 MED ORDER — POTASSIUM CHLORIDE CRYS ER 20 MEQ PO TBCR: 40.0000 meq | EXTENDED_RELEASE_TABLET | Freq: Once | ORAL | Status: DC

## 2022-08-02 MED ORDER — PANTOPRAZOLE SODIUM 40 MG PO TBEC
40.0000 mg | DELAYED_RELEASE_TABLET | Freq: Two times a day (BID) | ORAL | Status: DC
  Administered 2022-08-03: 40 mg via ORAL
  Filled 2022-08-02: qty 1

## 2022-08-02 MED ORDER — ONDANSETRON HCL 4 MG/2ML IJ SOLN
INTRAMUSCULAR | Status: AC
  Filled 2022-08-02: qty 2

## 2022-08-02 MED ORDER — ENOXAPARIN SODIUM 40 MG/0.4ML IJ SOSY
40.0000 mg | PREFILLED_SYRINGE | INTRAMUSCULAR | Status: DC
  Filled 2022-08-02: qty 0.4

## 2022-08-02 MED ORDER — PROPOFOL 500 MG/50ML IV EMUL
INTRAVENOUS | Status: DC | PRN
  Administered 2022-08-02: 150 ug/kg/min via INTRAVENOUS

## 2022-08-02 MED ORDER — PROPOFOL 10 MG/ML IV BOLUS
INTRAVENOUS | Status: DC | PRN
  Administered 2022-08-02: 10 mg via INTRAVENOUS

## 2022-08-02 MED ORDER — ALUM & MAG HYDROXIDE-SIMETH 200-200-20 MG/5ML PO SUSP: 15.0000 mL | Freq: Four times a day (QID) | ORAL | Status: DC | PRN

## 2022-08-02 MED ORDER — ONDANSETRON HCL 4 MG/2ML IJ SOLN
INTRAMUSCULAR | Status: DC | PRN
  Administered 2022-08-02: 4 mg via INTRAVENOUS

## 2022-08-02 MED ORDER — PROPOFOL 1000 MG/100ML IV EMUL
INTRAVENOUS | Status: AC
  Filled 2022-08-02: qty 100

## 2022-08-02 MED ORDER — MAGIC MOUTHWASH W/LIDOCAINE
5.0000 mL | Freq: Four times a day (QID) | ORAL | Status: DC | PRN
  Administered 2022-08-02 (×2): 5 mL via ORAL
  Filled 2022-08-02 (×5): qty 5

## 2022-08-02 MED ORDER — LIDOCAINE 2% (20 MG/ML) 5 ML SYRINGE
INTRAMUSCULAR | Status: DC | PRN
  Administered 2022-08-02: 100 mg via INTRAVENOUS

## 2022-08-02 NOTE — Consult Note (Signed)
New Hematology/Oncology Consult   Requesting DP:OEUMPN [place]        Reason for Consult: Pancreas Mass  HPI: [name] has a 3-19-monthhistory of anorexia with an 80 pound weight loss.  She reports postprandial diarrhea. She has been seen in the emergency room on several occasions over the past few months.  A CT abdomen/pelvis 06/01/2022 revealed atrophy of the pancreas tail and possible dilation of the distal pancreatic duct with ill-defined soft tissue thickening and inflammation at the body of the pancreas.  There are uterine fibroids. She was seen in the internal medicine clinic on 06/19/2022 and began a trial of pancreatic enzyme replacement.  She reports constipation when she takes the full dose of pancreatic enzymes. She presented to emergency room 07/30/2022 with nausea/vomiting and abdominal pain.  She was noted to have hypokalemia.  She was admitted for further evaluation. Dr. SFuller Planwas consulted.  A CT of the abdomen pelvis confirmed a 5.7 x 3.2 cm pancreas body/neck mass with associated peripheral pancreatic atrophy and duct dilation.  There is adjacent peripancreatic stranding with suspected encasement of the celiac axis.  Upper abdominal venous collaterals are likely related to occlusion of the splenic portal confluence. An MRI of the abdomen 07/30/2022 reveals a normal appearing liver.  A 3.7 x 4.7 cm hypoenhancing mass in the pancreas body/tail with atrophy of the distal pancreatic tail and duct dilation.  There is encasement of the proximal celiac artery with occlusion and collateralization at the confluence of the SMV and portal vein.  The SMA is uninvolved.  Small upper abdominal lymph nodes including a 7 mm porta hepatis node.  No ascites.  No evidence of metastatic disease in the abdomen.  She is scheduled for an EUS today.  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 sided w/ benign papilloma excised in 4/09. L sided in 5/09   Gallstones    Hyperlipidemia    Hypertension    Migraine    "monthly" (11/15/2016)   [place]romuscular [place] ([facility]    left arm and side of face    Neuropathy    Thought to be 2/2 nerve compression in the C-spine   Obesity    Personal history of adenomatous colonic polyp    09/2007 - diminutive adenoma ([facility])   PONV (postoperative nausea and vomiting)    [place]    2021- brain tumor bx caused ? stroke-    Tobacco abuse   G1, P1   Past Surgical History:  Procedure Laterality Date   APPENDECTOMY     BRAIN BIOPSY     [facility]- 03-2020   BREAST SURGERY Right 12/2007   central duct excision   CHOLECYSTECTOMY N/A 12/08/2019   Procedure: LAPAROSCOPIC CHOLECYSTECTOMY;  Surgeon: RRalene Ok MD;  [place]  Service: General;  Laterality: N/A;   COLONOSCOPY  2009   COLONOSCOPY  09/14/2020   COLONOSCOPY W/ BIOPSIES AND POLYPECTOMY  09/2007   Dr.  [name]. 2 small adenomatous polyps, internal hemorrhoids-Grade 1. Rec  routine colonoscopy at age 59.   COLONOSCOPY W/ BIOPSIES AND POLYPECTOMY     NASAL SEPTOPLASTY W/ TURBINOPLASTY Bilateral 04/2008   Septoplasty with bilateral inferior turbinate reduction; Dr. Lucia Gaskins   SHOULDER OPEN ROTATOR CUFF REPAIR Right     after hurting it by moving heavy objecs  :   Current Facility-Administered Medications:    acetaminophen (TYLENOL) tablet 650 mg, 650 mg, Oral, Q6H PRN **OR** acetaminophen (TYLENOL) suppository 650 mg, 650 mg, Rectal, Q6H PRN, Karmen Bongo, MD   diphenhydrAMINE  (BENADRYL) capsule 50 mg, 50 mg, Oral, Once **OR** diphenhydrAMINE (BENADRYL) injection 50 mg, 50 mg, Intravenous, Once, Shawna Clamp, MD   hydrALAZINE (APRESOLINE) injection 5 mg, 5 mg, Intravenous, Q4H PRN, Karmen Bongo, MD   lactated ringers infusion, , Intravenous, Continuous, Karmen Bongo, MD, Last Rate: 75 mL/hr at 08/02/22 0457, Infusion Verify at 08/02/22 0457   lactose free nutrition (BOOST PLUS) liquid 237 mL, 237 mL, Oral, BID BM, Shawna Clamp, MD   morphine (PF) 2 MG/ML injection 2 mg, 2 mg, Intravenous, Q2H PRN, Karmen Bongo, MD   multivitamin with minerals tablet 1 tablet, 1 tablet, Oral, Daily, Shawna Clamp, MD, 1 tablet at 08/01/22 1310   nicotine (NICODERM CQ - dosed in mg/24 hours) patch 21 mg, 21 mg, Transdermal, Daily, Karmen Bongo, MD   ondansetron [facility]) tablet 4 mg, 4 mg, Oral, Q6H PRN **OR** ondansetron (ZOFRAN) injection 4 mg, 4 mg, Intravenous, Q6H PRN, Karmen Bongo, MD   oxyCODONE (Oxy IR/ROXICODONE) immediate release tablet 5 mg, 5 mg, Oral, Q4H PRN, Karmen Bongo, MD, 5 mg at 07/31/22 1843   promethazine (PHENERGAN) injection 25 mg, 25 mg, Intramuscular, Q6H PRN, Karmen Bongo, MD:   diphenhydrAMINE  50 mg Oral Once   Or   diphenhydrAMINE  50 mg Intravenous Once   lactose free nutrition  237 mL Oral BID BM   multivitamin with minerals  1 tablet Oral Daily   nicotine  21 mg Transdermal Daily  :   Allergies  Allergen Reactions   Iohexol Hives, Itching and Other (See Comments)    Benadryl 38m PO one hour before iodinated contrast studies.  jkl  :  FH: Father-lung cancer, maternal aunt-pancreas cancer, maternal aunt-breast cancer  SOCIAL HISTORY: She lives with her sister in [place]  She smokes cigarettes.  She does not use alcohol.  She is not employed.  Review of Systems:  Positives include: Anorexia with 80 pound weight loss, postprandial diarrhea, intermittent cramping abdominal discomfort, 1 episode of nausea and vomiting,  vaginal bleeding  A complete ROS was otherwise negative.   Physical Exam:  Blood pressure (!) 144/66, pulse 76, temperature 97.7 F (36.5 C), temperature source Oral, resp. rate 16, height _0  (1.676 m), weight 165 lb (74.8 kg), last menstrual period 02/07/2016, SpO2 96 %.  HEENT: Thick white coat over the tongue, no buccal thrush Lungs: Clear bilaterally Cardiac: Regular rate and rhythm Abdomen: Tender in the mid and right upper abdomen, no mass, no hepatosplenomegaly, no apparent ascites  Vascular: No leg edema Lymph nodes: No cervical, supraclavicular, axillary, or inguinal nodes Neurologic: Alert and oriented, the motor exam appears intact in the upper and lower extremities bilaterally Skin: No rash Musculoskeletal: No spine tenderness  LABS:   Recent Labs    07/30/22 0907 07/31/22 0514  WBC 13.2* 8.0  HGB 15.7* 13.5  HCT 46.7* 40.9  PLT 245 165     Recent Labs    07/30/22 0907 07/31/22  2423 08/01/22 1418  NA 142 142  --   K 2.7* 3.0* 3.3*  CL 102 106  --   CO2 27 27  --   GLUCOSE 110* 85  --   BUN 9 8  --   CREATININE 0.57 0.66  --   CALCIUM 9.9 9.1  --       RADIOLOGY:  CT CHEST W CONTRAST  Result Date: 08/01/2022 CLINICAL DATA:  Weight loss, unintended metastatic workup EXAM: CT CHEST WITH CONTRAST TECHNIQUE: Multidetector CT imaging of the chest was performed during intravenous contrast administration. RADIATION DOSE REDUCTION: This exam was performed according to the departmental dose-optimization program which includes automated exposure control, adjustment of the mA and/or kV according to patient size and/or use of iterative reconstruction technique. CONTRAST:  45m OMNIPAQUE IOHEXOL 300 MG/ML  SOLN COMPARISON:  MRI abdomen 07/30/2022, CT abdomen pelvis 07/30/2022 FINDINGS: Cardiovascular: Normal heart size. No significant pericardial effusion. The thoracic aorta is normal in caliber. No atherosclerotic plaque of the thoracic aorta. No coronary  artery calcifications. The main pulmonary artery is normal in caliber. No central or segmental pulmonary embolus. Mediastinum/Nodes: No enlarged mediastinal, hilar, or axillary lymph nodes. Thyroid gland, trachea, and esophagus demonstrate no significant findings. Lungs/Pleura: No focal consolidation. No pulmonary nodule. No pulmonary mass. No pleural effusion. No pneumothorax. Upper Abdomen: Redemonstration of a pancreatic mass better evaluated on MRI abdomen 07/30/2022. nonobstructive nephrolithiasis. Musculoskeletal: No chest wall abnormality. Coarsely calcified lesions within the breast may be related to prior trauma. No suspicious lytic or blastic osseous lesions. No acute displaced fracture. Multilevel degenerative changes of the spine. IMPRESSION: 1. No intrathoracic metastatic disease. 2. No central or segmental pulmonary embolus. 3. No acute intrathoracic abnormality. 4. Nonobstructive nephrolithiasis. 5. Redemonstration of a pancreatic mass better evaluated on MRI abdomen 07/30/2022. Electronically Signed   By: MIven FinnM.D.   On: 08/01/2022 20:35   MR ABDOMEN W WO CONTRAST  Result Date: 07/30/2022 CLINICAL DATA:  Pancreatic cancer, for staging EXAM: MRI ABDOMEN WITHOUT AND WITH CONTRAST TECHNIQUE: Multiplanar multisequence MR imaging of the abdomen was performed both before and after the administration of intravenous contrast. CONTRAST:  716mGADAVIST GADOBUTROL 1 MMOL/ML IV SOLN COMPARISON:  CT abdomen/pelvis dated 07/30/2022 and 06/01/2022. FINDINGS: Lower chest: Lung bases are clear. Hepatobiliary: Liver is within normal limits. No suspicious/enhancing hepatic lesions. Status post cholecystectomy. No intrahepatic ductal dilatation. Common duct measures 8 mm. No choledocholithiasis is seen. Pancreas: 3.7 x 4.7 cm hypoenhancing mass in the pancreatic body/tail (series 20/image 39), compatible with pancreatic adenocarcinoma. Atrophy of distal pancreatic tail with associated ductal dilatation.  Spleen:  Within normal limits. Adrenals/Urinary Tract:  Adrenal glands are within normal limits. Subcentimeter bilateral renal cysts, measuring up to 6 mm in the right upper kidney (series 4/image 12), benign (Bosniak I). No follow-up is recommended. No hydronephrosis. Stomach/Bowel: Stomach is within normal limits. Visualized bowel is grossly unremarkable. Vascular/Lymphatic:  No evidence of abdominal aortic aneurysm. Encasement of the proximal celiac artery (series 20/image 37). SMA is uninvolved. 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 Small upper abdominal lymph nodes on diffusion imaging (series 6/image 12), including a 7 mm short axis node in the porta hepatis (series 20/image 30), within the upper limits of normal. Other:  No abdominal ascites. Musculoskeletal: No focal osseous lesions. IMPRESSION: 3.7 x 4.7 cm hypoenhancing mass in the pancreatic body/tail, compatible with pancreatic adenocarcinoma. Encasement of the proximal celiac artery. Occlusion with collateralization at the confluence of the SMV and  portal vein. No findings suspicious for metastatic disease in the abdomen. Electronically Signed   By: Julian Hy M.D.   On: 07/30/2022 23:57   MR 3D Recon At Scanner  Result Date: 07/30/2022 CLINICAL DATA:  Pancreatic cancer, for staging EXAM: MRI ABDOMEN WITHOUT AND WITH CONTRAST TECHNIQUE: Multiplanar multisequence MR imaging of the abdomen was performed both before and after the administration of intravenous contrast. CONTRAST:  2m GADAVIST GADOBUTROL 1 MMOL/ML IV SOLN COMPARISON:  CT abdomen/pelvis dated 07/30/2022 and 06/01/2022. FINDINGS: Lower chest: Lung bases are clear. Hepatobiliary: Liver is within normal limits. No suspicious/enhancing hepatic lesions. Status post cholecystectomy. No intrahepatic ductal dilatation. Common duct measures 8 mm. No choledocholithiasis is seen. Pancreas: 3.7 x 4.7 cm hypoenhancing mass in the pancreatic  body/tail (series 20/image 39), compatible with pancreatic adenocarcinoma. Atrophy of distal pancreatic tail with associated ductal dilatation. Spleen:  Within normal limits. Adrenals/Urinary Tract:  Adrenal glands are within normal limits. Subcentimeter bilateral renal cysts, measuring up to 6 mm in the right upper kidney (series 4/image 12), benign (Bosniak I). No follow-up is recommended. No hydronephrosis. Stomach/Bowel: Stomach is within normal limits. Visualized bowel is grossly unremarkable. Vascular/Lymphatic:  No evidence of abdominal aortic aneurysm. Encasement of the proximal celiac artery (series 20/image 37). SMA is uninvolved. 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 Small upper abdominal lymph nodes on diffusion imaging (series 6/image 12), including a 7 mm short axis node in the porta hepatis (series 20/image 30), within the upper limits of normal. Other:  No abdominal ascites. Musculoskeletal: No focal osseous lesions. IMPRESSION: 3.7 x 4.7 cm hypoenhancing mass in the pancreatic body/tail, compatible with pancreatic adenocarcinoma. Encasement of the proximal celiac artery. 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 No findings suspicious for metastatic disease in the abdomen. Electronically Signed   By: SJulian HyM.D.   On: 07/30/2022 23:57   CT ABDOMEN PELVIS WO CONTRAST  Result Date: 07/30/2022 CLINICAL DATA:  A 56year old female presents following diagnosis of pancreatic mass for further evaluation due to continued abdominal cramping and pain. * Tracking Code: BO * EXAM: CT ABDOMEN AND PELVIS WITHOUT CONTRAST TECHNIQUE: Multidetector CT imaging of the abdomen and pelvis was performed following the standard protocol without IV contrast. RADIATION DOSE REDUCTION: This exam was performed according to the departmental dose-optimization program which includes automated exposure control, adjustment of  the mA and/or kV according to patient size and/or use of iterative reconstruction technique. COMPARISON:  w June 01, 2022 FINDINGS: Lower chest: Lung bases are clear. No effusion. No consolidative changes. Hepatobiliary: Post cholecystectomy. No focal, suspicious hepatic lesion within limitations of the current exam. Pancreas: Masslike appearance of the pancreatic body and neck may measure as much as 5.7 x 3.2 cm and is associated with peripheral pancreatic atrophy and underlying ductal dilation. There is some adjacent peripancreatic stranding. Suspect encasement of the celiac axis. Numerous upper abdominal venous collaterals are noted likely related to occlusion of the splenic portal confluence based on the appearance of this masslike area in the pancreatic parenchyma. Spleen: Normal. Adrenals/Urinary Tract: Adrenal glands are normal. Renal contours are smooth. No hydronephrosis. No ureteral calculi. Stable appearance of intrarenal calculi, largest on the RIGHT measuring approximately 5 mm. Urinary bladder is collapsed. Stomach/Bowel: No acute gastrointestinal process. Appendix is normal. Distal stomach and proximal duodenum inseparable from the suspected mass in the pancreas. Vascular/Lymphatic: Aortic atherosclerosis. No sign of aneurysm. Smooth contour of the IVC. There is no gastrohepatic or  hepatoduodenal ligament lymphadenopathy. No retroperitoneal or mesenteric lymphadenopathy. No pelvic sidewall lymphadenopathy. Atherosclerotic changes are mild with calcification. Vascular structures show limited assessment due to lack of intravenous contrast. 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 Other: No ascites. Musculoskeletal: No acute or significant osseous findings. IMPRESSION: 1. 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 Difficult to exclude the possibility of superimposed  pancreatitis. Suggest follow-up with MRI with without contrast, GI/surgical referral with endoscopic evaluation when possible. 2. Stable appearance of intrarenal calculi, largest on the RIGHT measuring approximately 5 mm. 3. 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 4. Aortic atherosclerosis. Aortic Atherosclerosis (ICD10-I70.0). Electronically Signed   By: Zetta Bills M.D.   On: 07/30/2022 12:46    Assessment and Plan:  1.  Pancreas mass CT abdomen/pelvis 07/30/2022-body/neck pancreas mass with peripheral pancreatic atrophy and duct dilation, suspected encasement of the celiac axis, uterine fibroids MRI abdomen 07/30/2022-3.7 x 4.7 cm pancreas body/tail mass with encasement of the proximal celiac artery, occlusion with collateralization at the confluence of the SMV and portal vein, no evidence of metastatic disease CT chest 08/01/2022-no intrathoracic metastases Elevated CA 19-9  2.  Pilocystic astrocytoma of the dorsal midbrain-biopsy 03/17/2020, followed at [facility] with observation  3.  Anorexia/weight loss secondary to #1 4.  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  Abdominal pain 6.  History of colon polyps 7.  Family history of multiple cancers including breast, lung, and pancreas cancer 8.  Hypertension 9.  Postmenopausal vaginal bleeding  [name] presents with anorexia/weight loss, abdominal pain, nausea, and diarrhea.  The clinical presentation and imaging studies are consistent with a diagnosis of locally advanced pancreas cancer.  The pancreas body mass appears unresectable based on wedging evidence of arterial involvement.  She is scheduled for diagnostic EUS biopsy today. I will present her case at the GI tumor conference and make a surgical referral if indicated. We will most likely recommend initial treatment with systemic chemotherapy.  We will follow-up on the pathology from the EUS biopsy.  We will submit molecular testing.  She has a  significant family history of cancer, including pancreas cancer.  She will be referred to the genetics counselor.  Recommendations: Proceed with EUS and biopsy today Continue oxycodone as needed for pain Antiemetic regimen Consider trial of prednisone, 10 mg daily as an appetite stimulant and antiemetic Refer to the genetics counselor Outpatient follow-up will be scheduled at the Cancer center for the week of 08/06/2022 Port-A-Cath placement GYN evaluation for the postmenopausal vaginal bleeding-GYN oncology may be able to see her in the hospital Continue follow-up at [facility] for the [place], MD 08/02/2022, 6:49 AM

## 2022-08-02 NOTE — Anesthesia Preprocedure Evaluation (Signed)
Anesthesia Evaluation  Patient identified by MRN, date of birth, ID band Patient awake    Reviewed: Allergy & Precautions, NPO status , Patient's Chart, lab work & pertinent test results, reviewed documented beta blocker date and time   History of Anesthesia Complications (+) PONV and history of anesthetic complications  Airway Mallampati: II  TM Distance: >3 FB Neck ROM: Full    Dental no notable dental hx.    Pulmonary Current SmokerPatient did not abstain from smoking.   Pulmonary exam normal breath sounds clear to auscultation       Cardiovascular hypertension, Pt. on medications and Pt. on home beta blockers Normal cardiovascular exam Rhythm:Regular Rate:Normal  ECG: Sinus bradycardia with Fusion complexes Right superior axis deviation Septal infarct , age undetermined   Neuro/Psych  Headaches PSYCHIATRIC DISORDERS  Depression    Neuropathy  Neuromuscular disease    GI/Hepatic negative GI ROS, Neg liver ROS,,,  Endo/Other    Renal/GU negative Renal ROS     Musculoskeletal  (+) Arthritis , Osteoarthritis,  Chronic lower back pain   Abdominal   Peds  Hematology HLD   Anesthesia Other Findings GALLSTONES  Reproductive/Obstetrics                             Anesthesia Physical Anesthesia Plan  ASA: III  Anesthesia Plan: MAC   Post-op Pain Management: Minimal or no pain anticipated   Induction: Intravenous  PONV Risk Score and Plan: 2 and Ondansetron, Midazolam and Treatment may vary due to age or medical condition  Airway Management Planned: Nasal Cannula  Additional Equipment:   Intra-op Plan:   Post-operative Plan:   Informed Consent: I have reviewed the patients History and Physical, chart, labs and discussed the procedure including the risks, benefits and alternatives for the proposed anesthesia with the patient or authorized representative who has indicated his/her  understanding and acceptance.     Dental advisory given  Plan Discussed with: CRNA  Anesthesia Plan Comments:         Anesthesia Quick Evaluation

## 2022-08-02 NOTE — Consult Note (Signed)
Chief Complaint: Patient was seen in consultation today for pancreatic mass  at the request of Terrilee Croak  Referring Physician(s): Dahal, Marlowe Aschoff  Supervising Physician: Daryll Brod  Patient Status: [facility] - In-pt  History of Present Illness: Kathleen Stone is a 56 y.o. female  that presented to [place] ED 06/01/22 after being referred by her GYN for abdominal pain and vaginal bleeding post hysterosalpingogram in the office. She reported discomfort during procedure, but had severe cramping upon returning home. She arrived to ED via EMS. CT AP at that time revealed focal soft tissue thickening with stranding and inflammation centered at the body of the pancreas suspicious for pancreatic mass. She underwent MR Abd that showed hypoenhancing mass in the pancreatic body/tail compatible with pancreatic adenocarcinoma. Pt delayed evaluation and further workup d/t insurance issues. She presented to [facility] ED 07/30/22 c/o abd pain, N/V, 70 lb weight loss over 1-2 months, fatigue and malaise. She was admitted for chronic central mesenteric venous occlusion and possible pancreatitis. Pt has been referred to IR for tunneled catheter with port placement.   Past Medical History:  Diagnosis Date   Brain tumor (benign) (HCC)    Chronic lower back pain    Negative MRI of hips bilaterally, neg CT scan of A and P; MRI of T and L spine-> very mild deg changes from T4-5 and T7-8, cervical spondylotic changes at C5-6, very mild disc bulge at L2-3 without spinal stenosis, nl ESR, ANA, RF, HIV, TSH, and B12   DJD (degenerative joint disease) of cervical spine    Family history of adverse reaction to anesthesia    "sisters PONV"   Galactorrhea of both breasts 2009   R. sided w/ benign papilloma excised in 4/09. L sided in 5/09   Gallstones    Hyperlipidemia    Hypertension    Migraine    "monthly" (11/15/2016)   Neuromuscular disorder ([place])    left arm and side of face    Neuropathy    Thought to be 2/2  nerve compression in the C-spine   Obesity    Personal history of adenomatous colonic polyp    09/2007 - diminutive adenoma ([place])   PONV (postoperative nausea and vomiting)    Stroke ([place])    2021- brain tumor bx caused ? stroke-    Tobacco abuse     Past Surgical History:  Procedure Laterality Date   APPENDECTOMY     BRAIN BIOPSY     [facility]- 03-2020   BREAST SURGERY Right 12/2007   central duct excision   CHOLECYSTECTOMY N/A 12/08/2019   Procedure: LAPAROSCOPIC CHOLECYSTECTOMY;  Surgeon: Ralene Ok, MD;  Location: [facility];  Service: General;  Laterality: N/A;   COLONOSCOPY  2009   COLONOSCOPY  09/14/2020   COLONOSCOPY W/ BIOPSIES AND POLYPECTOMY  09/2007   Dr. [name]. 2 small adenomatous polyps, internal hemorrhoids-Grade 1. Rec  routine colonoscopy at age 54.   COLONOSCOPY W/ BIOPSIES AND POLYPECTOMY     NASAL SEPTOPLASTY W/ TURBINOPLASTY Bilateral 04/2008   Septoplasty with bilateral inferior turbinate reduction; Dr. Lucia Gaskins   SHOULDER OPEN ROTATOR CUFF REPAIR Right     after hurting it by moving heavy objecs    Allergies: Iohexol  Medications: Prior to Admission medications   Medication Sig Start Date End Date Taking? Authorizing Provider  calcium elemental as carbonate (TUMS ULTRA 1000) 400 MG chewable tablet Chew 1,000 mg by mouth 3 (three) times daily as needed for heartburn.   Yes [provider]  metoprolol tartrate (  LOPRESSOR) 25 MG tablet Take 1 tablet (25 mg total) by mouth 2 (two) times daily. Take 1 tablet 2 hours before your CT scan Patient taking differently: Take 25 mg by mouth 2 (two) times daily. 05/10/22  Yes Serita Butcher, MD  Pancrelipase, Lip-Prot-Amyl, [facility]) [phone] units CPEP Take 25,000 Units by mouth in the morning, at noon, and at bedtime. Patient taking differently: Take 16,800 units of lipase by mouth in the morning, at noon, and at bedtime. 06/19/22  Yes Delene Ruffini, MD  albuterol (PROVENTIL HFA) 108 (90 Base) MCG/ACT  inhaler Inhale 1-2 puffs into the lungs every 6 (six) hours as needed for wheezing or shortness of breath. Patient not taking: Reported on 07/31/2022 11/01/21   Atway, Rayann N, DO  atorvastatin (LIPITOR) 40 MG tablet Take 1 tablet (40 mg total) by mouth daily. Patient not taking: Reported on 07/31/2022 03/06/22   Wayland Denis, MD  famotidine (PEPCID) 10 MG tablet Take 1 tablet (10 mg total) by mouth 2 (two) times daily. Patient not taking: Reported on 07/31/2022 03/13/22   Masters, Joellen Jersey, DO  nicotine (NICODERM CQ - DOSED IN MG/24 HOURS) 21 mg/24hr patch Place 1 patch (21 mg total) onto the skin daily. Patient not taking: Reported on 07/31/2022 03/13/22 03/13/23  Masters, Joellen Jersey, DO  nitroGLYCERIN (NITROSTAT) 0.4 MG SL tablet Place 1 tablet (0.4 mg total) under the tongue every 5 (five) minutes as needed for chest pain. Patient not taking: Reported on 07/31/2022 11/01/21 07/31/22  Atway, Jeananne Rama, DO  norethindrone (AYGESTIN) 5 MG tablet Take 5 mg by mouth daily. Patient not taking: Reported on 07/31/2022 05/11/22   [provider]  ondansetron (ZOFRAN) 4 MG tablet Take 1 tablet (4 mg total) by mouth every 8 (eight) hours as needed for nausea or vomiting. Patient not taking: Reported on 07/31/2022 06/19/22 06/19/23  Delene Ruffini, MD  potassium chloride SA (KLOR-CON M) 20 MEQ tablet Take 1 tablet (20 mEq total) by mouth daily for 3 days. Patient not taking: Reported on 07/31/2022 05/21/22 07/31/22  Lennice Sites, DO  predniSONE (DELTASONE) 50 MG tablet Take 1 tab 13 hrs before, 1 tab 7 hr before and 1 tab 1 hr before your CT scan Patient not taking: Reported on 07/31/2022 04/13/22   Jerline Pain, MD  pregabalin (LYRICA) 50 MG capsule Take 1 capsule (50 mg total) by mouth 3 (three) times daily. Start taking it twice daily for 1 week and increase to three times daily after that. Patient not taking: Reported on 07/31/2022 02/21/22 07/31/22  Idamae Schuller, MD     Family History  Problem Relation  Age of Onset   Heart failure Mother    Diabetes Mother    Cancer Father        lung   Lung cancer Father    Breast cancer Other        aunt   Liver cancer Other        aunt   Cancer Maternal Aunt        breast cancer   Colon cancer Neg Hx    Colon polyps Neg Hx    Esophageal cancer Neg Hx    Rectal cancer Neg Hx    Stomach cancer Neg Hx     Social History   Socioeconomic History   Marital status: Married    Spouse name: Not on file   Number of children: Not on file   Years of education: Not on file   Highest education level: Not on file  Occupational  History   Occupation: unemployed - applied for disability  Tobacco Use   Smoking status: Every Day    Packs/day: 1.50    Years: 36.00    Total pack years: 54.00    Types: Cigarettes   Smokeless tobacco: Never  Vaping Use   Vaping Use: Former  Substance and Sexual Activity   Alcohol use: No    Alcohol/week: 0.0 standard drinks of alcohol   Drug use: No   Sexual activity: Yes    Partners: Female  Other Topics Concern   Not on file  Social History Narrative   Domestic Partner: female has emphysema   Smoked 2 packs/day for 20+ years, down to 1/2ppd now, willing to try to quit    Alcohol use-no   Drug use-no   Regular exercise-no   Social Determinants of Health   Financial Resource Strain: Not on file  Food Insecurity: No Food Insecurity (07/31/2022)   Hunger Vital Sign    Worried About Running Out of Food in the Last Year: Never true    [place] in the Last Year: Never true  Transportation Needs: No Transportation Needs (07/31/2022)   PRAPARE - Hydrologist (Medical): No    Lack of Transportation (Non-Medical): No  Physical Activity: Not on file  Stress: Not on file  Social Connections: Not on file    Review of Systems: A 12 point ROS discussed and pertinent positives are indicated in the HPI above.  All other systems are negative.  Review of Systems  Constitutional:   Positive for appetite change, fatigue and unexpected weight change. Negative for chills and fever.  HENT:  Positive for sore throat.   Respiratory:  Negative for shortness of breath.   Cardiovascular:  Negative for chest pain.  Gastrointestinal:  Negative for abdominal pain.  Neurological:  Positive for headaches. Negative for dizziness.    Vital Signs: BP (!) 146/67 (BP Location: Left Wrist)   Pulse 62   Temp (!) 97.5 F (36.4 C) (Oral)   Resp 16   Ht _0  (1.676 m)   Wt 165 lb (74.8 kg)   LMP 02/07/2016 (Exact Date)   SpO2 100%   BMI 26.63 kg/m     Physical Exam Vitals reviewed.  Constitutional:      General: She is not in acute distress.    Appearance: She is ill-appearing.  HENT:     Head: Normocephalic and atraumatic.  Cardiovascular:     Rate and Rhythm: Normal rate and regular rhythm.     Pulses: Normal pulses.     Heart sounds: Normal heart sounds.  Pulmonary:     Effort: Pulmonary effort is normal. No respiratory distress.     Breath sounds: Normal breath sounds.  Abdominal:     General: Bowel sounds are normal. There is no distension.     Palpations: Abdomen is soft.     Tenderness: There is no abdominal tenderness. There is no guarding.  Musculoskeletal:     Right lower leg: No edema.     Left lower leg: No edema.  Neurological:     Mental Status: She is alert and oriented to person, place, and time.  Psychiatric:        Mood and Affect: Mood normal.        Behavior: Behavior normal.        Thought Content: Thought content normal.        Judgment: Judgment normal.     Imaging:  CT CHEST W CONTRAST  Result Date: 08/01/2022 CLINICAL DATA:  Weight loss, unintended metastatic workup EXAM: CT CHEST WITH CONTRAST TECHNIQUE: Multidetector CT imaging of the chest was performed during intravenous contrast administration. RADIATION DOSE REDUCTION: This exam was performed according to the departmental dose-optimization program which includes automated  exposure control, adjustment of the mA and/or kV according to patient size and/or use of iterative reconstruction technique. CONTRAST:  52m OMNIPAQUE IOHEXOL 300 MG/ML  SOLN COMPARISON:  MRI abdomen 07/30/2022, CT abdomen pelvis 07/30/2022 FINDINGS: Cardiovascular: Normal heart size. No significant pericardial effusion. The thoracic aorta is normal in caliber. No atherosclerotic plaque of the thoracic aorta. No coronary artery calcifications. The main pulmonary artery is normal in caliber. No central or segmental pulmonary embolus. Mediastinum/Nodes: No enlarged mediastinal, hilar, or axillary lymph nodes. Thyroid gland, trachea, and esophagus demonstrate no significant findings. Lungs/Pleura: No focal consolidation. No pulmonary nodule. No pulmonary mass. No pleural effusion. No pneumothorax. Upper Abdomen: Redemonstration of a pancreatic mass better evaluated on MRI abdomen 07/30/2022. nonobstructive nephrolithiasis. Musculoskeletal: No chest wall abnormality. Coarsely calcified lesions within the breast may be related to prior trauma. No suspicious lytic or blastic osseous lesions. No acute displaced fracture. Multilevel degenerative changes of the spine. IMPRESSION: 1. No intrathoracic metastatic disease. 2. No central or segmental pulmonary embolus. 3. No acute intrathoracic abnormality. 4. Nonobstructive nephrolithiasis. 5. Redemonstration of a pancreatic mass better evaluated on MRI abdomen 07/30/2022. Electronically Signed   By: MIven FinnM.D.   On: 08/01/2022 20:35   MR ABDOMEN W WO CONTRAST  Result Date: 07/30/2022 CLINICAL DATA:  Pancreatic cancer, for staging EXAM: MRI ABDOMEN WITHOUT AND WITH CONTRAST TECHNIQUE: Multiplanar multisequence MR imaging of the abdomen was performed both before and after the administration of intravenous contrast. CONTRAST:  718mGADAVIST GADOBUTROL 1 MMOL/ML IV SOLN COMPARISON:  CT abdomen/pelvis dated 07/30/2022 and 06/01/2022. FINDINGS: Lower chest: Lung bases  are clear. Hepatobiliary: Liver is within normal limits. No suspicious/enhancing hepatic lesions. Status post cholecystectomy. No intrahepatic ductal dilatation. Common duct measures 8 mm. No choledocholithiasis is seen. Pancreas: 3.7 x 4.7 cm hypoenhancing mass in the pancreatic body/tail (series 20/image 39), compatible with pancreatic adenocarcinoma. Atrophy of distal pancreatic tail with associated ductal dilatation. Spleen:  Within normal limits. Adrenals/Urinary Tract:  Adrenal glands are within normal limits. Subcentimeter bilateral renal cysts, measuring up to 6 mm in the right upper kidney (series 4/image 12), benign (Bosniak I). No follow-up is recommended. No hydronephrosis. Stomach/Bowel: Stomach is within normal limits. Visualized bowel is grossly unremarkable. Vascular/Lymphatic:  No evidence of abdominal aortic aneurysm. Encasement of the proximal celiac artery (series 20/image 37). SMA is uninvolved. 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 Small upper abdominal lymph nodes on diffusion imaging (series 6/image 12), including a 7 mm short axis node in the porta hepatis (series 20/image 30), within the upper limits of normal. Other:  No abdominal ascites. Musculoskeletal: No focal osseous lesions. IMPRESSION: 3.7 x 4.7 cm hypoenhancing mass in the pancreatic body/tail, compatible with pancreatic adenocarcinoma. Encasement of the proximal celiac artery. 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 No findings suspicious for metastatic disease in the abdomen. Electronically Signed   By: SrJulian Hy.D.   On: 07/30/2022 23:57   MR 3D Recon At Scanner  Result Date: 07/30/2022 CLINICAL DATA:  Pancreatic cancer, for staging EXAM: MRI ABDOMEN WITHOUT AND WITH CONTRAST TECHNIQUE: Multiplanar multisequence MR imaging of the abdomen was performed both before and after  the administration of intravenous contrast.  CONTRAST:  23m GADAVIST GADOBUTROL 1 MMOL/ML IV SOLN COMPARISON:  CT abdomen/pelvis dated 07/30/2022 and 06/01/2022. FINDINGS: Lower chest: Lung bases are clear. Hepatobiliary: Liver is within normal limits. No suspicious/enhancing hepatic lesions. Status post cholecystectomy. No intrahepatic ductal dilatation. Common duct measures 8 mm. No choledocholithiasis is seen. Pancreas: 3.7 x 4.7 cm hypoenhancing mass in the pancreatic body/tail (series 20/image 39), compatible with pancreatic adenocarcinoma. Atrophy of distal pancreatic tail with associated ductal dilatation. Spleen:  Within normal limits. Adrenals/Urinary Tract:  Adrenal glands are within normal limits. Subcentimeter bilateral renal cysts, measuring up to 6 mm in the right upper kidney (series 4/image 12), benign (Bosniak I). No follow-up is recommended. No hydronephrosis. Stomach/Bowel: Stomach is within normal limits. Visualized bowel is grossly unremarkable. Vascular/Lymphatic:  No evidence of abdominal aortic aneurysm. Encasement of the proximal celiac artery (series 20/image 37). SMA is uninvolved. 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 Small upper abdominal lymph nodes on diffusion imaging (series 6/image 12), including a 7 mm short axis node in the porta hepatis (series 20/image 30), within the upper limits of normal. Other:  No abdominal ascites. Musculoskeletal: No focal osseous lesions. IMPRESSION: 3.7 x 4.7 cm hypoenhancing mass in the pancreatic body/tail, compatible with pancreatic adenocarcinoma. Encasement of the proximal celiac artery. 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 No findings suspicious for metastatic disease in the abdomen. Electronically Signed   By: SJulian HyM.D.   On: 07/30/2022 23:57   CT ABDOMEN PELVIS WO CONTRAST  Result Date: 07/30/2022 CLINICAL DATA:  A 56year old female presents following diagnosis of pancreatic  mass for further evaluation due to continued abdominal cramping and pain. * Tracking Code: BO * EXAM: CT ABDOMEN AND PELVIS WITHOUT CONTRAST TECHNIQUE: Multidetector CT imaging of the abdomen and pelvis was performed following the standard protocol without IV contrast. RADIATION DOSE REDUCTION: This exam was performed according to the departmental dose-optimization program which includes automated exposure control, adjustment of the mA and/or kV according to patient size and/or use of iterative reconstruction technique. COMPARISON:  w June 01, 2022 FINDINGS: Lower chest: Lung bases are clear. No effusion. No consolidative changes. Hepatobiliary: Post cholecystectomy. No focal, suspicious hepatic lesion within limitations of the current exam. Pancreas: Masslike appearance of the pancreatic body and neck may measure as much as 5.7 x 3.2 cm and is associated with peripheral pancreatic atrophy and underlying ductal dilation. There is some adjacent peripancreatic stranding. Suspect encasement of the celiac axis. Numerous upper abdominal venous collaterals are noted likely related to occlusion of the splenic portal confluence based on the appearance of this masslike area in the pancreatic parenchyma. Spleen: Normal. Adrenals/Urinary Tract: Adrenal glands are normal. Renal contours are smooth. No hydronephrosis. No ureteral calculi. Stable appearance of intrarenal calculi, largest on the RIGHT measuring approximately 5 mm. Urinary bladder is collapsed. Stomach/Bowel: No acute gastrointestinal process. Appendix is normal. Distal stomach and proximal duodenum inseparable from the suspected mass in the pancreas. Vascular/Lymphatic: Aortic atherosclerosis. No sign of aneurysm. Smooth contour of the IVC. There is no gastrohepatic or hepatoduodenal ligament lymphadenopathy. No retroperitoneal or mesenteric lymphadenopathy. No pelvic sidewall lymphadenopathy. Atherosclerotic changes are mild with calcification. Vascular  structures show limited assessment due to lack of intravenous contrast. 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 Other: No ascites. Musculoskeletal: No acute or significant osseous findings. IMPRESSION: 1. Findings which are highly suspicious for advanced pancreatic neoplasm with chronic central  mesenteric venous occlusion and vascular encasement. Difficult to exclude the possibility of superimposed pancreatitis. Suggest follow-up with MRI with without contrast, GI/surgical referral with endoscopic evaluation when possible. 2. Stable appearance of intrarenal calculi, largest on the RIGHT measuring approximately 5 mm. 3. 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 4. Aortic atherosclerosis. Aortic Atherosclerosis (ICD10-I70.0). Electronically Signed   By: Zetta Bills M.D.   On: 07/30/2022 12:46    Labs:  CBC: Recent Labs    06/01/22 1727 07/30/22 0907 07/31/22 0514 08/02/22 1000  WBC 12.0* 13.2* 8.0 14.5*  HGB 15.0 15.7* 13.5 13.7  HCT 44.9 46.7* 40.9 40.9  PLT 175 245 165 160    COAGS: Recent Labs    07/31/22 1255  INR 1.0    BMP: Recent Labs    06/01/22 1727 06/19/22 1031 07/30/22 0907 07/31/22 0514 08/01/22 1418 08/02/22 1000  NA 142 143 142 142  --  141  K 3.5 3.0* 2.7* 3.0* 3.3* 3.2*  CL 102 102 102 106  --  107  CO2 _0 --  28  GLUCOSE 116* 98 110* 85  --  95  BUN _1 --  <5*  CALCIUM 9.8 9.8 9.9 9.1  --  9.3  CREATININE 0.75 0.68 0.57 0.66  --  0.53  GFRNONAA >60  --  >60 >60  --  >60    LIVER FUNCTION TESTS: Recent Labs    06/01/22 1727 07/30/22 0907 07/31/22 0514 08/02/22 1000  BILITOT 1.2 1.3* 1.6* 1.3*  AST _2 ALT _3 ALKPHOS 72 68 55 53  PROT 7.2 7.0 5.8* 6.1*  ALBUMIN 4.1 3.7 3.0* 2.9*    TUMOR MARKERS: No results for input(s): "AFPTM", "CEA", "CA199", "CHROMGRNA" in the last 8760 hours.  Assessment and Plan: 56 yo female with PMH significant for benign  brain tumor, CVA, HLD, HTN presents to IR for tunneled catheter with port placement.   Pt resting in bed. She is A&O, calm and pleasant.  She is in no distress.   Risks and benefits of image guided tunneled catheter with port placement with moderate sedation was discussed with the patient including, but not limited to bleeding, infection, pneumothorax, or fibrin sheath development and need for additional procedures.  All of the patient's questions were answered, patient is agreeable to proceed. Consent signed and in chart.  Thank you for this interesting consult.  I greatly enjoyed meeting Kathleen Stone and look forward to participating in their care.  A copy of this report was sent to the requesting provider on this date.  Electronically Signed: Tyson Alias, NP 08/02/2022, 2:43 PM   I spent a total of 20 minutes  in face to face in clinical consultation, greater than 50% of which was counseling/coordinating care for pancreatic mass.

## 2022-08-02 NOTE — Interval H&P Note (Signed)
History and Physical Interval Note:  08/02/2022 11:28 AM  Kathleen Stone  has presented today for surgery, with the diagnosis of pancreatic tail mass.  The various methods of treatment have been discussed with the patient and family. After consideration of risks, benefits and other options for treatment, the patient has consented to  Procedure(s) with comments: FULL UPPER ENDOSCOPIC ULTRASOUND (EUS) RADIAL (N/A) - with bx of pancreatic mass as a surgical intervention.  The patient's history has been reviewed, patient examined, no change in status, stable for surgery.  I have reviewed the patient's chart and labs.  Questions were answered to the patient's satisfaction.     The risks of an EUS including intestinal perforation, bleeding, infection, aspiration, and medication effects were discussed as was the possibility it may not give a definitive diagnosis if a biopsy is performed.  When a biopsy of the pancreas is done as part of the EUS, there is an additional risk of pancreatitis at the rate of about 1-2%.  It was explained that procedure related pancreatitis is typically mild, although it can be severe and even life threatening, which is why we do not perform random pancreatic biopsies and only biopsy a lesion/area we feel is concerning enough to warrant the risk.    [employer]

## 2022-08-02 NOTE — Transfer of Care (Signed)
Immediate Anesthesia Transfer of Care Note  Patient: Kathleen Stone  Procedure(s) Performed: FULL UPPER ENDOSCOPIC ULTRASOUND (EUS) RADIAL BIOPSY POLYPECTOMY FINE NEEDLE ASPIRATION (FNA) LINEAR  Patient Location: Endoscopy Unit  Anesthesia Type:MAC  Level of Consciousness: drowsy  Airway & Oxygen Therapy: Patient Spontanous Breathing and Patient connected to face mask oxygen  Post-op Assessment: Report given to RN and Post -op Vital signs reviewed and stable  Post vital signs: Reviewed and stable  Last Vitals:  Vitals Value Taken Time  BP    Temp    Pulse    Resp    SpO2      Last Pain:  Vitals:   08/02/22 1023  TempSrc: Temporal  PainSc: 0-No pain         Complications: No notable events documented.

## 2022-08-02 NOTE — Anesthesia Procedure Notes (Signed)
Procedure Name: MAC Date/Time: 08/02/2022 11:35 AM  Performed by: Maxwell Caul, CRNAPre-anesthesia Checklist: Patient identified, Emergency Drugs available, Suction available and Patient being monitored Oxygen Delivery Method: Simple face mask

## 2022-08-02 NOTE — Op Note (Addendum)
[facility] Patient Name: Kathleen Stone Procedure Date: 08/02/2022 MRN: [id number] Attending MD: Justice Britain , MD, [id number] Date of Birth: 1965-12-01 CSN: [id number] Age: 56 Admit Type: Inpatient Procedure:                Upper EUS Indications:              Suspected mass in pancreas on CT scan, Suspected                            mass in pancreas on MRCP, Elevated CA 19-9 Providers:                Justice Britain, MD, Kary Kos RN, RN, Paulo Fruit, RN, [facility] Technician, Technician,                            Darliss Cheney, Technician Referring MD:              Medicines:                Monitored Anesthesia Care Complications:            No immediate complications. Estimated Blood Loss:     Estimated blood loss was minimal. Procedure:                Pre-Anesthesia Assessment:                           - Prior to the procedure, a History and Physical                            was performed, and patient medications and                            allergies were reviewed. The patient's tolerance of                            previous anesthesia was also reviewed. The risks                            and benefits of the procedure and the sedation                            options and risks were discussed with the patient.                            All questions were answered, and informed consent                            was obtained. Prior Anticoagulants: The patient has                            taken no anticoagulant or antiplatelet agents. ASA  Grade Assessment: III - A patient with severe                            systemic disease. After reviewing the risks and                            benefits, the patient was deemed in satisfactory                            condition to undergo the procedure.                           After obtaining informed consent, the endoscope was                             passed under direct vision. Throughout the                            procedure, the patient's blood pressure, pulse, and                            oxygen saturations were monitored continuously. The                            [id number]-H190 ([id number]) Olympus endoscope was introduced                            through the mouth, and advanced to the second part                            of duodenum. The [id number]-Q190V ([id number]) Olympus                            duodenoscope was introduced through the mouth, and                            advanced to the area of papilla. The [id number]                            ([id number]) Olympus linear ultrasound scope was                            introduced through the mouth, and advanced to the                            duodenum for ultrasound examination from the                            stomach and duodenum. The upper EUS was                            accomplished without difficulty. The patient  tolerated the procedure. Scope In: Scope Out: Findings:      ENDOSCOPIC FINDING: :      No gross lesions were noted in the entire esophagus.      The Z-line was irregular and was found 34 cm from the incisors.      A 2 cm hiatal hernia was present.      Multiple dispersed small to medium nodular erosions with no bleeding and       no stigmata of recent bleeding were found at the incisura, in the       gastric antrum and in the prepyloric region of the stomach.      A single 12 mm sessile polyp with no bleeding and no stigmata of recent       bleeding was found on the greater curvature of the stomach. The polyp       was removed with a cold snare. Resection and retrieval were complete.      No other gross lesions were noted in the entire examined stomach.       Biopsies were taken with a cold forceps for histology and Helicobacter       pylori testing.      Diffuse moderate mucosal variance characterized by friability  (with       contact bleeding), scalloping and altered texture was found in the       entire duodenum. Biopsies were taken with a cold forceps for histology.      The major papilla was normal.      ENDOSONOGRAPHIC FINDING: :      An irregular mass was identified in the pancreatic body. The mass was       hypoechoic. The mass measured 32 mm by 33 mm in maximal cross-sectional       diameter. The outer margins were irregular. There was sonographic       evidence suggesting invasion into the celiac trunk (manifested by       encasement) and the splenoportal confluence (manifested by interface       loss less than 15 mm). An intact interface was seen between the mass and       the superior mesenteric artery suggesting a lack of invasion. The       remainder of the pancreas was examined. The endosonographic appearance       of parenchyma and the upstream pancreatic duct indicated duct dilation       and parenchymal atrophy. Fine needle biopsy was performed. Color Doppler       imaging was utilized prior to needle puncture to confirm a lack of       significant vascular structures within the needle path. Six passes were       made with the 22 gauge Acquire biopsy needle using a transgastric       approach. Visible cores of tissue were obtained. Preliminary cytologic       examination and touch preps were performed. Final cytology results are       pending.      Endosonographic imaging in the common bile duct and in the common       hepatic duct showed no stones with mild prominence (from       post-cholecystectomy effect).      Endosonographic imaging of the ampulla showed no intramural       (subepithelial) lesion.      Endosonographic imaging in the visualized portion of the liver showed no  mass.      Two malignant-appearing lymph nodes were visualized in the porta hepatis       region. The largest measured 15 mm by 12 mm in maximal cross-sectional       diameter. The nodes were round,  hypoechoic and had well defined margins.       These were not sampled due to proximity to collateral vessels in the       portal region.      The celiac region was visualized. Impression:               EGD Impression:                           - No gross lesions in the entire esophagus. Z-line                            irregular, 34 cm from the incisors.                           - 2 cm hiatal hernia.                           - Erosive gastropathy with no bleeding and no                            stigmata of recent bleeding.                           - A single gastric polyp. Resected and retrieved.                           - No other gross lesions in the entire stomach.                            Biopsied.                           - Mucosal variance in the duodenum. Biopsied.                           - Normal major papilla.                           EUS Impression:                           - A mass was identified in the pancreatic body.                            Cytology results are pending. However, the                            endosonographic appearance is highly suspicious for                            adenocarcinoma. This was staged T4 N1 Mx by  endosonographic criteria. The staging applies if                            malignancy is confirmed. Fine needle biopsy                            performed of the mass.                           - Two malignant-appearing lymph nodes were                            visualized in the porta hepatis region. Tissue has                            not been obtained. However, the endosonographic                            appearance is consistent with metastatic pancreatic                            adenocarcinoma. These were not sampled due to                            proximity to collateral vessels in the portal region Moderate Sedation:      Not Applicable - Patient had care per  Anesthesia. Recommendation:           - The patient will be observed post-procedure,                            until all discharge criteria are met.                           - Return patient to hospital ward for ongoing care.                           - Patient has a contact number available for                            emergencies. The signs and symptoms of potential                            delayed complications were discussed with the                            patient. Return to normal activities tomorrow.                            Written discharge instructions were provided to the                            patient.                           - Observe patient's clinical  course.                           - Await cytology results and await path results.                           - ADAT.                           - Start Protonix 40 mg twice daily for 29-month and                            then go to once daily thereafter.                           - Monitor for signs/symptoms of perforation,                            bleeding, infection, pancreatitis.                           - The findings and recommendations were discussed                            with the patient.                           - The findings and recommendations were discussed                            with the patient's family.                           - The findings and recommendations were discussed                            with the referring physician. Procedure Code(s):        --- Professional ---                           [id number] Esophagogastroduodenoscopy, flexible,                            transoral; with transendoscopic ultrasound-guided                            intramural or transmural fine needle                            aspiration/biopsy(s), (includes endoscopic                            ultrasound examination limited to the esophagus,                            stomach or duodenum,  and adjacent structures)  80034, Esophagogastroduodenoscopy, flexible,                            transoral; with removal of tumor(s), polyp(s), or                            other lesion(s) by snare technique Diagnosis Code(s):        --- Professional ---                           K22.89, Other specified disease of esophagus                           K44.9, Diaphragmatic hernia without obstruction or                            gangrene                           K31.89, Other diseases of stomach and duodenum                           K31.7, Polyp of stomach and duodenum                           K86.89, Other specified diseases of pancreas                           I89.9, Noninfective disorder of lymphatic vessels                            and lymph nodes, unspecified                           R93.3, Abnormal findings on diagnostic imaging of                            other parts of digestive tract                           R97.8, Other abnormal tumor markers CPT copyright 2022 American [employer] Association. All rights reserved. The codes documented in this report are preliminary and upon coder review may  be revised to meet current compliance requirements. Justice Britain, MD 08/02/2022 1:06:43 PM Number of Addenda: 0

## 2022-08-02 NOTE — Progress Notes (Signed)
Oncology Discharge Planning Note  [facility] Address: [address] [place], [place], [place] [address] Hours of Operation:  Nena Polio, Monday - Friday  Clinic Contact Information:  [phone]  Oncology Care Team: Medical Oncologist:  Benay Spice  Patient Details: Name:  Kathleen Stone, Kathleen Stone MRN:   [id number] DOB:   [birth year] Reason for Current Admission: '@PPROB'$ @  Discharge Planning Narrative: Notification of admission received by Inpatient team for Kellie Simmering.  Discharge follow-up appointments for oncology are current and available on the AVS and MyChart.   Upon discharge from the hospital, hematology/oncology's post discharge plan of care for the outpatient setting is: November 22,2023 at [facility] at [place], [place] [address]  [phone]    Dionicia Cerritos will be called within two business days after discharge to review hematology/oncology's plan of care for full understanding.    Outpatient Oncology Specific Care Only: Oncology appointment transportation needs addressed?:  no Oncology medication management for symptom management addressed?:  no Chemo Alert Card reviewed?:  not applicable Immunotherapy Alert Card reviewed?:  not applicable

## 2022-08-02 NOTE — Progress Notes (Signed)
Nutrition Follow-up  DOCUMENTATION CODES:  Severe malnutrition in context of chronic illness  INTERVENTION:  -Advance diet as clinically appropriate -Continue MVI -Continue Boost Plus BID (350kcal and 14g protein) -Encourage small, frequent meals, snacks and supplements  NUTRITION DIAGNOSIS:  Severe Malnutrition related to chronic illness as evidenced by percent weight loss, energy intake < or equal to 75% for > or equal to 1 month, moderate fat depletion, severe muscle depletion.  GOAL:  Patient will meet greater than or equal to 90% of their needs  MONITOR:  PO intake, Supplement acceptance  REASON FOR ASSESSMENT:  Consult Assessment of nutrition requirement/status  ASSESSMENT:  Pt is a 56yo F with PMH of  HTN, HLD, pilocytic astrocytoma, and neuropathy presenting with abdominal pain. Pancreatic mass found on CT, admitted for workup.  Pt seen and assessed on 11/16, found to have severe protein calorie malnutrition. She is now s/p Upper EUS which showed, "A 2 cm hiatal hernia.Multiple dispersed small to medium nodular erosions with no bleeding and no stigmata of recent bleeding at the incisura, in the gastric antrum and in the prepyloric region of the stomach.A single 12 mm sessile polyp with no bleeding and no stigmata of recent bleeding was found on the greater curvature of the stomach. Diffuse moderate mucosal variance characterized by friability (with contact bleeding), scalloping and altered texture was found in the entire duodenum."  Pt started on full liquid diet following procedure. Boost Plus remains appropriate. Boost Plus will provide 350kcal and 14g protein/bottle. Advance diet as clinically appropriate. Provide small, frequent portions of food to optimize po intake.  NUTRITION - FOCUSED PHYSICAL EXAM:  Flowsheet Row Most Recent Value  Orbital Region Moderate depletion  Upper Arm Region Severe depletion  Thoracic and Lumbar Region Moderate depletion  Buccal Region  Mild depletion  Temple Region Moderate depletion  Clavicle Bone Region Moderate depletion  Clavicle and Acromion Bone Region Moderate depletion  Scapular Bone Region Unable to assess  Dorsal Hand Mild depletion  Patellar Region Severe depletion  Anterior Thigh Region Severe depletion  Posterior Calf Region Severe depletion  Hair Reviewed  Eyes Reviewed  Mouth Reviewed  [c/o taste changes]  Skin Reviewed  Nails Reviewed       Diet Order:   Diet Order             Diet NPO time specified Except for: Sips with Meds  Diet effective midnight           Diet full liquid Room service appropriate? Yes; Fluid consistency: Thin  Diet effective now                   EDUCATION NEEDS:  No education needs have been identified at this time  Skin:  Skin Assessment: Reviewed RN Assessment  Last BM:  11/13  Height:  Ht Readings from Last 1 Encounters:  07/30/22 '5\' 6"'$  (1.676 m)   Weight:  Wt Readings from Last 1 Encounters:  07/30/22 74.8 kg    BMI:  Body mass index is 26.63 kg/m.  Estimated Nutritional Needs:  Kcal:  2245-2620kcal Protein:  90-115g Fluid:  1870-2241m  KCandise Bowens MS, RD, LDN, CNSC See AMiON for contact information

## 2022-08-02 NOTE — Progress Notes (Signed)
PROGRESS NOTE  Kathleen Stone  DOB: 06-Jun-1966  PCP: Christiana Fuchs, DO YSA:[id number]  DOA: 07/30/2022  LOS: 3 days  Hospital Day: 4  Brief narrative: Kathleen Stone is a 56 y.o. 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 She has been seen in the ED on several occasions over the past few months.   9/15, CT abdomen showed atrophy of the pancreas tail and possible dilatation of the distal pancreatic duct with ill-defined soft tissue thickening and inflammation of the pancreatic body. 10/3, she was seen in internal medicine clinic and was started on a trial of pancreatic enzyme replacement.  MRI abdomen was ordered but it did not happen because of lack of insurance. 11/13, she was seen in the ED for progression of symptoms. MRCP showed a 3.7 x 4.7 cm hypoenhancing mass in the pancreas body/tail with atrophy of the distal pancreatic tail and duct dilation, findings consistent with pancreatic adenocarcinoma.   Admitted to [facility]. General surgery, gastroenterology, oncology is consulted.  See below for details  Subjective: Patient was seen and examined this afternoon.  Pleasant middle-aged African-American female.  Comes in mild sore throat after yesterday. Chart reviewed. Underwent EUS and biopsy today.  Assessment and plan: Pancreatic adenocarcinoma Present with abdominal pain, poor oral intake, significant weight loss Imaging findings as above GI and oncology consult appreciated 11/16, underwent EUS with biopsy.  Preliminary biopsy report suggestive of adenocarcinoma. Port-A-Cath placement ordered.  Gastric ulcer Noted in EUS. Continue protonix.  Unintentional weight loss secondary to malignancy Nutritional consult. Currently on full liquid diet.  Advance as  tolerated.  Hypokalemia Potassium low at 3.2.  Replacement ordered. Recent Labs  Lab 07/30/22 0907 07/30/22 1106 07/31/22 0514 08/01/22 1418 08/02/22 1000  K 2.7*  --  3.0* 3.3* 3.2*  MG  --  1.8  --   --   --    Hyperlipidemia Continue atorvastatin   History of pilocytic astrocytoma Patient follows up with neurosurgery. Most recent MRI in 10/23 showed stable size.   No hydrocephalus seen on most recent MRI.    Essential hypertension Continue Lopressor   Tobacco Abuse Encourage cessation   Goals of care   Code Status: Full Code    Mobility: Encourage ambulation  Skin assessment:     Nutritional status:  Body mass index is 26.63 kg/m.  Nutrition Problem: Severe Malnutrition Etiology: chronic illness Signs/Symptoms: percent weight loss, energy intake < or equal to 75% for > or equal to 1 month, moderate fat depletion, severe muscle depletion Percent weight loss: 27.8 % (in 5 months)     Diet:  Diet Order             Diet NPO time specified Except for: Sips with Meds  Diet effective midnight           Diet full liquid Room service appropriate? Yes; Fluid consistency: Thin  Diet effective now                   DVT prophylaxis: Start Lovenox enoxaparin (LOVENOX) injection 40 mg Start: 08/03/22 0800  Antimicrobials: None Fluid: LR at 4 continue Consultants: GI, oncology Family Communication: None at bedside  Status is: Inpatient  Continue in-hospital care because: Pending Port-A-Cath placement Level of care: Med-Surg   Dispo: The patient is from: Home  Anticipated d/c is to: Ongoing cancer work-up              Patient currently is not medically stable to d/c.   Difficult to place patient No     Infusions:   lactated ringers 75 mL/hr at 08/02/22 1135    Scheduled Meds:  diphenhydrAMINE  50 mg Oral Once   Or   diphenhydrAMINE  50 mg Intravenous Once   [START ON 08/03/2022] enoxaparin (LOVENOX) injection  40 mg  Subcutaneous Q24H   lactose free nutrition  237 mL Oral BID BM   multivitamin with minerals  1 tablet Oral Daily   nicotine  21 mg Transdermal Daily   pantoprazole  40 mg Oral BID AC   potassium chloride  40 mEq Oral Once    PRN meds: acetaminophen **OR** acetaminophen, hydrALAZINE, morphine injection, ondansetron **OR** ondansetron (ZOFRAN) IV, oxyCODONE, promethazine (PHENERGAN) injection (IM or IVPB)   Antimicrobials: Anti-infectives (From admission, onward)    None       Objective: Vitals:   08/02/22 1310 08/02/22 1337  BP: 132/86 (!) 146/67  Pulse: 73 62  Resp: 17 16  Temp:  (!) 97.5 F (36.4 C)  SpO2: 100% 100%    Intake/Output Summary (Last 24 hours) at 08/02/2022 1632 Last data filed at 08/02/2022 1249 Gross per 24 hour  Intake 1443.75 ml  Output --  Net 1443.75 ml   Filed Weights   07/30/22 0859  Weight: 74.8 kg   Weight change:  Body mass index is 26.63 kg/m.   Physical Exam: General exam: Pleasant, middle-aged African-American female. Skin: No rashes, lesions or ulcers. HEENT: Atraumatic, normocephalic, no obvious bleeding Lungs: Clear to auscultation bilaterally CVS: Regular rate and rhythm, no murmur GI/Abd soft, nontender, nondistended, bowel sound present CNS: Alert, awake, oriented x3 Psychiatry: Mood appropriate Extremities: No pedal edema, no calf tenderness  Data Review: I have personally reviewed the laboratory data and studies available.  F/u labs  Unresulted Labs (From admission, onward)     Start     Ordered   07/31/22 1230  Pancreatic elastase, fecal  Once,   R        07/31/22 1233            Signed, Terrilee Croak, MD [facility] 08/02/2022

## 2022-08-02 NOTE — Progress Notes (Signed)
Brief Pharmacy Note  Pharmacy consulted for Lovenox for VTE prophylaxis. Patient s/p upper endoscopy today. Order for Lovenox 40 mg subcu q24h to start tomorrow morning. Pharmacy to sign off.  Tawnya Crook, PharmD, BCPS Clinical Pharmacist 08/02/2022 1:45 PM

## 2022-08-03 ENCOUNTER — Inpatient Hospital Stay (HOSPITAL_COMMUNITY): Payer: Commercial Managed Care - HMO

## 2022-08-03 ENCOUNTER — Other Ambulatory Visit: Payer: Self-pay | Admitting: *Deleted

## 2022-08-03 DIAGNOSIS — C259 Malignant neoplasm of pancreas, unspecified: Secondary | ICD-10-CM

## 2022-08-03 DIAGNOSIS — R112 Nausea with vomiting, unspecified: Secondary | ICD-10-CM | POA: Diagnosis not present

## 2022-08-03 DIAGNOSIS — C258 Malignant neoplasm of overlapping sites of pancreas: Secondary | ICD-10-CM

## 2022-08-03 DIAGNOSIS — K8689 Other specified diseases of pancreas: Secondary | ICD-10-CM

## 2022-08-03 DIAGNOSIS — R1084 Generalized abdominal pain: Secondary | ICD-10-CM | POA: Diagnosis not present

## 2022-08-03 HISTORY — PX: IR IMAGING GUIDED PORT INSERTION: IMG5740

## 2022-08-03 LAB — CYTOLOGY - NON PAP

## 2022-08-03 LAB — SURGICAL PATHOLOGY

## 2022-08-03 MED ORDER — FENTANYL CITRATE (PF) 100 MCG/2ML IJ SOLN
INTRAMUSCULAR | Status: AC
  Filled 2022-08-03: qty 2

## 2022-08-03 MED ORDER — HEPARIN SOD (PORK) LOCK FLUSH 100 UNIT/ML IV SOLN
500.0000 [IU] | INTRAVENOUS | Status: AC | PRN
  Administered 2022-08-03: 500 [IU]
  Filled 2022-08-03: qty 5

## 2022-08-03 MED ORDER — DIPHENHYDRAMINE HCL 50 MG/ML IJ SOLN: 50.0000 mg | Freq: Once | INTRAMUSCULAR | Status: DC

## 2022-08-03 MED ORDER — MIDAZOLAM HCL 2 MG/2ML IJ SOLN
INTRAMUSCULAR | Status: AC
  Filled 2022-08-03: qty 2

## 2022-08-03 MED ORDER — FENTANYL CITRATE (PF) 100 MCG/2ML IJ SOLN
INTRAMUSCULAR | Status: AC | PRN
  Administered 2022-08-03 (×4): 50 ug via INTRAVENOUS

## 2022-08-03 MED ORDER — LIDOCAINE-EPINEPHRINE 1 %-1:100000 IJ SOLN
INTRAMUSCULAR | Status: AC | PRN
  Administered 2022-08-03: 14 mL

## 2022-08-03 MED ORDER — MIDAZOLAM HCL 2 MG/2ML IJ SOLN
INTRAMUSCULAR | Status: AC | PRN
  Administered 2022-08-03 (×4): 1 mg via INTRAVENOUS

## 2022-08-03 MED ORDER — OXYCODONE HCL 5 MG PO TABS: 5.0000 mg | ORAL_TABLET | ORAL | 0 refills | Status: AC | PRN

## 2022-08-03 MED ORDER — DIPHENHYDRAMINE HCL 25 MG PO CAPS: 50.0000 mg | ORAL_CAPSULE | Freq: Once | ORAL | Status: DC

## 2022-08-03 MED ORDER — LIDOCAINE-EPINEPHRINE 1 %-1:100000 IJ SOLN
INTRAMUSCULAR | Status: AC
  Filled 2022-08-03: qty 1

## 2022-08-03 MED ORDER — PANTOPRAZOLE SODIUM 40 MG PO TBEC: 40.0000 mg | DELAYED_RELEASE_TABLET | Freq: Every day | ORAL | 0 refills | Status: DC

## 2022-08-03 MED ORDER — PREDNISONE 50 MG PO TABS
50.0000 mg | ORAL_TABLET | Freq: Four times a day (QID) | ORAL | Status: DC
  Administered 2022-08-03: 50 mg via ORAL
  Filled 2022-08-03: qty 1

## 2022-08-03 NOTE — Discharge Summary (Signed)
Physician Discharge Summary  [name] TFT:[id number] DOB: 06-06-1966 DOA: 07/30/2022  PCP: Christiana Fuchs, DO  Admit date: 07/30/2022 Discharge date: 08/03/2022  Admitted From: Home Discharge disposition: Home  Recommendations at discharge:  Follow-up with oncology as an outpatient.  Brief narrative: Kathleen Stone is a 56 y.o. 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 She has been seen in the ED on several occasions over the past few months.   9/15, CT abdomen showed atrophy of the pancreas tail and possible dilatation of the distal pancreatic duct with ill-defined soft tissue thickening and inflammation of the pancreatic body. 10/3, she was seen in internal medicine clinic and was started on a trial of pancreatic enzyme replacement.  MRI abdomen was ordered but it did not happen because of lack of insurance. 11/13, she was seen in the ED for progression of symptoms. MRCP showed a 3.7 x 4.7 cm hypoenhancing mass in the pancreas body/tail with atrophy of the distal pancreatic tail and duct dilation, findings consistent with pancreatic adenocarcinoma.   Admitted to [facility]. General surgery, gastroenterology, oncology is consulted.  See below for details  Subjective: Patient was seen and examined this morning.   Lying on bed.  Not in distress.   She had sore throat after EUS yesterday.  Improved with Magic mouthwash. Underwent Port-A-Cath placement by IR later this morning.    Assessment and plan: Pancreatic adenocarcinoma Present with abdominal pain, poor oral intake, significant weight loss Imaging findings as above GI, surgery and oncology consult appreciated 11/16, underwent EUS with biopsy.  Preliminary biopsy report suggestive of adenocarcinoma. Port-A-Cath placement by IR  today. Per general surgery,, 'Case and imaging reviewed with hepatobiliary surgeon, Dr. Zenia Resides. 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 Patient is not a candidate for surgery.' She will follow up with Dr. Benay Spice for chemotherapy.    Gastric ulcer Noted in EUS. Continue protonix.  Unintentional weight loss secondary to malignancy Nutritional consulted. Regular diet  Hypokalemia Potassium was low at 3.2 yesterday.  Replacement given. Recent Labs  Lab 07/30/22 0907 07/30/22 1106 07/31/22 0514 08/01/22 1418 08/02/22 1000  K 2.7*  --  3.0* 3.3* 3.2*  MG  --  1.8  --   --   --    Hyperlipidemia Not taking atorvastatin   History of pilocytic astrocytoma Patient follows up with neurosurgery. Most recent MRI in 10/23 showed stable size.   No hydrocephalus seen on most recent MRI.    Essential hypertension Continue Lopressor   Tobacco Abuse Encourage cessation  Wounds:  -    Discharge Exam:   Vitals:   08/03/22 1135 08/03/22 1140 08/03/22 1145 08/03/22 1202  BP: (!) 159/70 (!) 167/66 (!) 149/80 (!) 137/56  Pulse: 84 82 87 84  Resp: '10 12 16 17  '$ Temp:      TempSrc:      SpO2: 100% 98% 99% 96%  Weight:      Height:        Body mass index is 26.63 kg/m.  General exam: Pleasant, middle-aged African-American female. Skin: No rashes, lesions or ulcers. HEENT: Atraumatic, normocephalic, no obvious bleeding Lungs: Clear to auscultation bilaterally CVS: Regular rate and rhythm, no murmur GI/Abd soft, nontender, nondistended, bowel sound present CNS: Alert, awake, oriented x3  Psychiatry: Mood appropriate Extremities: No pedal edema, no calf tenderness  Follow ups:    Follow-up Information     Masters, Joellen Jersey, DO Follow up.   Specialty: Internal Medicine Contact information: [place] [address] [phone]                 Discharge Instructions:    Discharge Instructions     Call MD for:  difficulty breathing, headache or visual disturbances   Complete by: As directed    Call MD for:  extreme fatigue   Complete by: As directed    Call MD for:  hives   Complete by: As directed    Call MD for:  persistant dizziness or light-headedness   Complete by: As directed    Call MD for:  persistant nausea and vomiting   Complete by: As directed    Call MD for:  severe uncontrolled pain   Complete by: As directed    Call MD for:  temperature >100.4   Complete by: As directed    Diet general   Complete by: As directed    Discharge instructions   Complete by: As directed    Recommendations at discharge:   Follow-up with oncology as an outpatient.  General discharge instructions: Follow with Primary MD Masters, Joellen Jersey, DO in 7 days  Please request your PCP  to go over your hospital tests, procedures, radiology results at the follow up. Please get your medicines reviewed and adjusted.  Your PCP may decide to repeat certain labs or tests as needed. Do not drive, operate heavy machinery, perform activities at heights, swimming or participation in water activities or provide baby sitting services if your were admitted for syncope or siezures until you have seen by Primary MD or a Neurologist and advised to do so again. [place] Controlled Substance Reporting System database was reviewed. Do not drive, operate heavy machinery, perform activities at heights, swim, participate in water activities or provide baby-sitting services while on medications for pain, sleep and mood until your outpatient physician has reevaluated you and advised to do so again.  You are strongly recommended to comply with the dose, frequency and duration of prescribed medications. Activity: As tolerated with Full fall precautions use walker/cane & assistance as needed Avoid using any recreational substances like cigarette, tobacco, alcohol, or non-prescribed drug. If  you experience worsening of your admission symptoms, develop shortness of breath, life threatening emergency, suicidal or homicidal thoughts you must seek medical attention immediately by calling 911 or calling your MD immediately  if symptoms less severe. You must read complete instructions/literature along with all the possible adverse reactions/side effects for all the medicines you take and that have been prescribed to you. Take any new medicine only after you have completely understood and accepted all the possible adverse reactions/side effects.  Wear Seat belts while driving. You were cared for by a hospitalist during your hospital stay. If you have any questions about your discharge medications or the care you received while you were in the hospital after you are discharged, you can call the unit and ask to speak with the hospitalist or the covering physician. Once you are discharged, your primary care physician will handle any further medical issues. Please note that NO REFILLS for any discharge medications will be authorized once you are discharged, as it is imperative that you return to your primary care physician (or establish a relationship with a primary care physician if you do not have one).  Increase activity slowly   Complete by: As directed        Discharge Medications:   Allergies as of 08/03/2022       Reactions   Iohexol Hives, Itching, Other (See Comments)   Benadryl '50mg'$  PO one hour before iodinated contrast studies.  Jkl, per pt she only has itching when she urinates, not with injection 08/01/22 [facility]        Medication List     STOP taking these medications    albuterol 108 (90 Base) MCG/ACT inhaler Commonly known as: Proventil HFA   famotidine 10 MG tablet Commonly known as: PEPCID   predniSONE 50 MG tablet Commonly known as: DELTASONE   pregabalin 50 MG capsule Commonly known as: Lyrica       TAKE these medications    atorvastatin 40 MG  tablet Commonly known as: LIPITOR Take 1 tablet (40 mg total) by mouth daily.   metoprolol tartrate 25 MG tablet Commonly known as: LOPRESSOR Take 1 tablet (25 mg total) by mouth 2 (two) times daily. Take 1 tablet 2 hours before your CT scan What changed: additional instructions   nicotine 21 mg/24hr patch Commonly known as: NICODERM CQ - dosed in mg/24 hours Place 1 patch (21 mg total) onto the skin daily.   nitroGLYCERIN 0.4 MG SL tablet Commonly known as: NITROSTAT Place 1 tablet (0.4 mg total) under the tongue every 5 (five) minutes as needed for chest pain.   norethindrone 5 MG tablet Commonly known as: AYGESTIN Take 5 mg by mouth daily.   ondansetron 4 MG tablet Commonly known as: Zofran Take 1 tablet (4 mg total) by mouth every 8 (eight) hours as needed for nausea or vomiting.   oxyCODONE 5 MG immediate release tablet Commonly known as: Oxy IR/ROXICODONE Take 1 tablet (5 mg total) by mouth every 4 (four) hours as needed for up to 5 days for moderate pain.   Pancreaze 4200-14200 units Cpep Generic drug: Pancrelipase (Lip-Prot-Amyl) Take 25,000 Units by mouth in the morning, at noon, and at bedtime. What changed: how much to take   pantoprazole 40 MG tablet Commonly known as: PROTONIX Take 1 tablet (40 mg total) by mouth daily.   potassium chloride SA 20 MEQ tablet Commonly known as: KLOR-CON M Take 1 tablet (20 mEq total) by mouth daily for 3 days.   Tums Ultra 1000 400 MG chewable tablet Generic drug: calcium elemental as carbonate Chew 1,000 mg by mouth 3 (three) times daily as needed for heartburn.         The results of significant diagnostics from this hospitalization (including imaging, microbiology, ancillary and laboratory) are listed below for reference.    Procedures and Diagnostic Studies:   MR ABDOMEN W WO CONTRAST  Result Date: 07/30/2022 CLINICAL DATA:  Pancreatic cancer, for staging EXAM: MRI ABDOMEN WITHOUT AND WITH CONTRAST TECHNIQUE:  Multiplanar multisequence MR imaging of the abdomen was performed both before and after the administration of intravenous contrast. CONTRAST:  7m GADAVIST GADOBUTROL 1 MMOL/ML IV SOLN COMPARISON:  CT abdomen/pelvis dated 07/30/2022 and 06/01/2022. FINDINGS: Lower chest: Lung bases are clear. Hepatobiliary: Liver is within normal limits. No suspicious/enhancing hepatic lesions. Status post cholecystectomy. No intrahepatic ductal dilatation. Common duct measures 8 mm. No choledocholithiasis is seen. Pancreas: 3.7 x 4.7 cm hypoenhancing mass in the pancreatic body/tail (series 20/image 39), compatible with pancreatic adenocarcinoma. Atrophy of distal pancreatic tail with associated ductal dilatation. Spleen:  Within normal limits. Adrenals/Urinary Tract:  Adrenal glands are within normal limits. Subcentimeter bilateral  renal cysts, measuring up to 6 mm in the right upper kidney (series 4/image 12), benign (Bosniak I). No follow-up is recommended. No hydronephrosis. Stomach/Bowel: Stomach is within normal limits. Visualized bowel is grossly unremarkable. Vascular/Lymphatic:  No evidence of abdominal aortic aneurysm. Encasement of the proximal celiac artery (series 20/image 37). SMA is uninvolved. 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 Small upper abdominal lymph nodes on diffusion imaging (series 6/image 12), including a 7 mm short axis node in the porta hepatis (series 20/image 30), within the upper limits of normal. Other:  No abdominal ascites. Musculoskeletal: No focal osseous lesions. IMPRESSION: 3.7 x 4.7 cm hypoenhancing mass in the pancreatic body/tail, compatible with pancreatic adenocarcinoma. Encasement of the proximal celiac artery. 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 No findings suspicious for metastatic disease in the abdomen. Electronically Signed   By: Julian Hy M.D.   On: 07/30/2022 23:57   MR 3D  Recon At Scanner  Result Date: 07/30/2022 CLINICAL DATA:  Pancreatic cancer, for staging EXAM: MRI ABDOMEN WITHOUT AND WITH CONTRAST TECHNIQUE: Multiplanar multisequence MR imaging of the abdomen was performed both before and after the administration of intravenous contrast. CONTRAST:  31m GADAVIST GADOBUTROL 1 MMOL/ML IV SOLN COMPARISON:  CT abdomen/pelvis dated 07/30/2022 and 06/01/2022. FINDINGS: Lower chest: Lung bases are clear. Hepatobiliary: Liver is within normal limits. No suspicious/enhancing hepatic lesions. Status post cholecystectomy. No intrahepatic ductal dilatation. Common duct measures 8 mm. No choledocholithiasis is seen. Pancreas: 3.7 x 4.7 cm hypoenhancing mass in the pancreatic body/tail (series 20/image 39), compatible with pancreatic adenocarcinoma. Atrophy of distal pancreatic tail with associated ductal dilatation. Spleen:  Within normal limits. Adrenals/Urinary Tract:  Adrenal glands are within normal limits. Subcentimeter bilateral renal cysts, measuring up to 6 mm in the right upper kidney (series 4/image 12), benign (Bosniak I). No follow-up is recommended. No hydronephrosis. Stomach/Bowel: Stomach is within normal limits. Visualized bowel is grossly unremarkable. Vascular/Lymphatic:  No evidence of abdominal aortic aneurysm. Encasement of the proximal celiac artery (series 20/image 37). SMA is uninvolved. 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 Small upper abdominal lymph nodes on diffusion imaging (series 6/image 12), including a 7 mm short axis node in the porta hepatis (series 20/image 30), within the upper limits of normal. Other:  No abdominal ascites. Musculoskeletal: No focal osseous lesions. IMPRESSION: 3.7 x 4.7 cm hypoenhancing mass in the pancreatic body/tail, compatible with pancreatic adenocarcinoma. Encasement of the proximal celiac artery. 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  No findings suspicious for metastatic disease in the abdomen. Electronically Signed   By: SJulian HyM.D.   On: 07/30/2022 23:57   CT ABDOMEN PELVIS WO CONTRAST  Result Date: 07/30/2022 CLINICAL DATA:  A 56year old female presents following diagnosis of pancreatic mass for further evaluation due to continued abdominal cramping and pain. * Tracking Code: BO * EXAM: CT ABDOMEN AND PELVIS WITHOUT CONTRAST TECHNIQUE: Multidetector CT imaging of the abdomen and pelvis was performed following the standard protocol without IV contrast. RADIATION DOSE REDUCTION: This exam was performed according to the departmental dose-optimization program which includes automated exposure control, adjustment of the mA and/or kV according to patient size and/or use of iterative reconstruction technique. COMPARISON:  w June 01, 2022 FINDINGS: Lower chest: Lung bases are clear. No effusion. No consolidative changes. Hepatobiliary: Post cholecystectomy. No focal, suspicious hepatic lesion within limitations of the current exam.  Pancreas: Masslike appearance of the pancreatic body and neck may measure as much as 5.7 x 3.2 cm and is associated with peripheral pancreatic atrophy and underlying ductal dilation. There is some adjacent peripancreatic stranding. Suspect encasement of the celiac axis. Numerous upper abdominal venous collaterals are noted likely related to occlusion of the splenic portal confluence based on the appearance of this masslike area in the pancreatic parenchyma. Spleen: Normal. Adrenals/Urinary Tract: Adrenal glands are normal. Renal contours are smooth. No hydronephrosis. No ureteral calculi. Stable appearance of intrarenal calculi, largest on the RIGHT measuring approximately 5 mm. Urinary bladder is collapsed. Stomach/Bowel: No acute gastrointestinal process. Appendix is normal. Distal stomach and proximal duodenum inseparable from the suspected mass in the pancreas. Vascular/Lymphatic: Aortic  atherosclerosis. No sign of aneurysm. Smooth contour of the IVC. There is no gastrohepatic or hepatoduodenal ligament lymphadenopathy. No retroperitoneal or mesenteric lymphadenopathy. No pelvic sidewall lymphadenopathy. Atherosclerotic changes are mild with calcification. Vascular structures show limited assessment due to lack of intravenous contrast. 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 Other: No ascites. Musculoskeletal: No acute or significant osseous findings. IMPRESSION: 1. 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 Difficult to exclude the possibility of superimposed pancreatitis. Suggest follow-up with MRI with without contrast, GI/surgical referral with endoscopic evaluation when possible. 2. Stable appearance of intrarenal calculi, largest on the RIGHT measuring approximately 5 mm. 3. 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 4. Aortic atherosclerosis. Aortic Atherosclerosis (ICD10-I70.0). Electronically Signed   By: Zetta Bills M.D.   On: 07/30/2022 12:46     Labs:   Basic Metabolic Panel: Recent Labs  Lab 07/30/22 0907 07/30/22 1106 07/31/22 0514 08/01/22 1418 08/02/22 1000  NA 142  --  142  --  141  K 2.7*  --  3.0* 3.3* 3.2*  CL 102  --  106  --  107  CO2 27  --  27  --  28  GLUCOSE 110*  --  85  --  95  BUN 9  --  8  --  <5*  CREATININE 0.57  --  0.66  --  0.53  CALCIUM 9.9  --  9.1  --  9.3  MG  --  1.8  --   --   --    GFR Estimated Creatinine Clearance: 80.2 mL/min (by C-G formula based on SCr of 0.53 mg/dL). Liver Function Tests: Recent Labs  Lab 07/30/22 0907 07/31/22 0514 08/02/22 1000  AST '23 24 28  '$ ALT '16 20 21  '$ ALKPHOS 68 55 53  BILITOT 1.3* 1.6* 1.3*  PROT 7.0 5.8* 6.1*  ALBUMIN 3.7 3.0* 2.9*   Recent Labs  Lab 07/30/22 0907  LIPASE <10*   No results for input(s): "AMMONIA" in the last 168  hours. Coagulation profile Recent Labs  Lab 07/31/22 1255  INR 1.0    CBC: Recent Labs  Lab 07/30/22 0907 07/31/22 0514 08/02/22 1000  WBC 13.2* 8.0 14.5*  HGB 15.7* 13.5 13.7  HCT 46.7* 40.9 40.9  MCV 85.5 88.3 86.3  PLT 245 165 160   Cardiac Enzymes: No results for input(s): "CKTOTAL", "CKMB", "CKMBINDEX", "TROPONINI" in the last 168 hours. BNP: Invalid input(s): "POCBNP" CBG: No results for input(s): "GLUCAP" in the last 168 hours. D-Dimer No results for input(s): "DDIMER" in the last 72 hours. Hgb A1c No results for input(s): "HGBA1C" in the last 72 hours. Lipid Profile No results for input(s): "CHOL", "HDL", "LDLCALC", "  TRIG", "CHOLHDL", "LDLDIRECT" in the last 72 hours. Thyroid function studies No results for input(s): "TSH", "T4TOTAL", "T3FREE", "THYROIDAB" in the last 72 hours.  Invalid input(s): "FREET3" Anemia work up No results for input(s): "VITAMINB12", "FOLATE", "FERRITIN", "TIBC", "IRON", "RETICCTPCT" in the last 72 hours. Microbiology No results found for this or any previous visit (from the past 240 hour(s)).  Time coordinating discharge: 35 minutes  Signed: Shanzay Hepworth  [facility] 08/03/2022, 1:41 PM

## 2022-08-03 NOTE — Procedures (Signed)
Interventional Radiology Procedure Note  Procedure: RT IJ POWER PORT    Complications: None  Estimated Blood Loss:  MIN  Findings: TIP SVCRA    M. TREVOR Shailey Butterbaugh, MD    

## 2022-08-03 NOTE — Anesthesia Postprocedure Evaluation (Signed)
Anesthesia Post Note  Patient: Kathleen Stone  Procedure(s) Performed: FULL UPPER ENDOSCOPIC ULTRASOUND (EUS) RADIAL BIOPSY POLYPECTOMY FINE NEEDLE ASPIRATION (FNA) LINEAR     Patient location during evaluation: PACU Anesthesia Type: MAC Level of consciousness: awake and alert Pain management: pain level controlled Vital Signs Assessment: post-procedure vital signs reviewed and stable Respiratory status: spontaneous breathing, nonlabored ventilation and respiratory function stable Cardiovascular status: blood pressure returned to baseline and stable Postop Assessment: no apparent nausea or vomiting Anesthetic complications: no   No notable events documented.  Last Vitals:  Vitals:   08/02/22 2218 08/03/22 0508  BP: (!) 119/47 (!) 143/63  Pulse: 81 80  Resp: 18 18  Temp: 36.7 C 36.7 C  SpO2: 98% 96%    Last Pain:  Vitals:   08/03/22 0000  TempSrc:   PainSc: Asleep   Pain Goal:                   Lynda Rainwater

## 2022-08-03 NOTE — Progress Notes (Signed)
PROGRESS NOTE  Kathleen Stone  DOB: [birth year]  PCP: Christiana Fuchs, DO VPX:[id number]  DOA: 07/30/2022  LOS: 4 days  Hospital Day: 5  Brief narrative: Kathleen Stone is a 56 y.o. 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 She has been seen in the ED on several occasions over the past few months.   9/15, CT abdomen showed atrophy of the pancreas tail and possible dilatation of the distal pancreatic duct with ill-defined soft tissue thickening and inflammation of the pancreatic body. 10/3, she was seen in internal medicine clinic and was started on a trial of pancreatic enzyme replacement.  MRI abdomen was ordered but it did not happen because of lack of insurance. 11/13, she was seen in the ED for progression of symptoms. MRCP showed a 3.7 x 4.7 cm hypoenhancing mass in the pancreas body/tail with atrophy of the distal pancreatic tail and duct dilation, findings consistent with pancreatic adenocarcinoma.   Admitted to [facility]. General surgery, gastroenterology, oncology is consulted.  See below for details  Subjective: Patient was seen and examined this morning.  Lying on bed.  She complained of sore throat after EUS yesterday.  Improved with Magic mouthwash. Pending Port-A-Cath placement today.  Assessment and plan: Pancreatic adenocarcinoma Present with abdominal pain, poor oral intake, significant weight loss Imaging findings as above GI, surgery and oncology consult appreciated 11/16, underwent EUS with biopsy.  Preliminary biopsy report suggestive of adenocarcinoma. Port-A-Cath placement for today.  Gastric ulcer Noted in EUS. Continue protonix.  Unintentional weight loss secondary to malignancy Nutritional consulted. Currently on full liquid diet.  Plan for regular diet after  Port-A-Cath.  Hypokalemia Potassium was low at 3.2 yesterday.  Replacement given. Recent Labs  Lab 07/30/22 0907 07/30/22 1106 07/31/22 0514 08/01/22 1418 08/02/22 1000  K 2.7*  --  3.0* 3.3* 3.2*  MG  --  1.8  --   --   --    Hyperlipidemia Continue atorvastatin   History of pilocytic astrocytoma Patient follows up with neurosurgery. Most recent MRI in 10/23 showed stable size.   No hydrocephalus seen on most recent MRI.    Essential hypertension Continue Lopressor   Tobacco Abuse Encourage cessation   Goals of care   Code Status: Full Code    Mobility: Encourage ambulation  Skin assessment:     Nutritional status:  Body mass index is 26.63 kg/m.  Nutrition Problem: Severe Malnutrition Etiology: chronic illness Signs/Symptoms: percent weight loss, energy intake < or equal to 75% for > or equal to 1 month, moderate fat depletion, severe muscle depletion Percent weight loss: 27.8 % (in 5 months)     Diet:  Diet Order             Diet NPO time specified Except for: Sips with Meds  Diet effective midnight                   DVT prophylaxis: Lovenox, currently on hold because of planned procedure today enoxaparin (LOVENOX) injection 40 mg Start: 08/03/22 0800  Antimicrobials: None Fluid: LR at 75 to continue while n.p.o. Consultants: GI, oncology, general surgery Family Communication: None at bedside  Status is: Inpatient  Continue in-hospital care because: Pending Port-A-Cath placement today Level of care: Med-Surg   Dispo: The patient is from: Home  Anticipated d/c is to: Likely discharge in the afternoon after Port-A-Cath placement.              Patient currently is not medically stable to d/c.   Difficult to place patient No     Infusions:   lactated ringers 75 mL/hr at 08/02/22 1634    Scheduled Meds:  diphenhydrAMINE  50 mg Oral Once   Or   diphenhydrAMINE  50 mg Intravenous Once   enoxaparin (LOVENOX) injection  40  mg Subcutaneous Q24H   fentaNYL       lactose free nutrition  237 mL Oral BID BM   lidocaine-EPINEPHrine       midazolam       multivitamin with minerals  1 tablet Oral Daily   nicotine  21 mg Transdermal Daily   pantoprazole  40 mg Oral BID AC   potassium chloride  40 mEq Oral Once    PRN meds: acetaminophen **OR** acetaminophen, alum & mag hydroxide-simeth, fentaNYL, hydrALAZINE, lidocaine-EPINEPHrine, magic mouthwash w/lidocaine, midazolam, morphine injection, ondansetron **OR** ondansetron (ZOFRAN) IV, oxyCODONE, promethazine (PHENERGAN) injection (IM or IVPB)   Antimicrobials: Anti-infectives (From admission, onward)    None       Objective: Vitals:   08/02/22 2218 08/03/22 0508  BP: (!) 119/47 (!) 143/63  Pulse: 81 80  Resp: 18 18  Temp: 98.1 F (36.7 C) 98.1 F (36.7 C)  SpO2: 98% 96%    Intake/Output Summary (Last 24 hours) at 08/03/2022 1044 Last data filed at 08/02/2022 1634 Gross per 24 hour  Intake 781.25 ml  Output --  Net 781.25 ml   Filed Weights   07/30/22 0859  Weight: 74.8 kg   Weight change:  Body mass index is 26.63 kg/m.   Physical Exam: General exam: Pleasant, middle-aged African-American female. Skin: No rashes, lesions or ulcers. HEENT: Atraumatic, normocephalic, no obvious bleeding Lungs: Clear to auscultation bilaterally CVS: Regular rate and rhythm, no murmur GI/Abd soft, nontender, nondistended, bowel sound present CNS: Alert, awake, oriented x3 Psychiatry: Mood appropriate Extremities: No pedal edema, no calf tenderness  Data Review: I have personally reviewed the laboratory data and studies available.  F/u labs  Unresulted Labs (From admission, onward)     Start     Ordered   07/31/22 1230  Pancreatic elastase, fecal  Once,   R        07/31/22 1233            Signed, Terrilee Croak, MD [facility] 08/03/2022

## 2022-08-03 NOTE — Progress Notes (Signed)
Daily Progress Note  Hospital Day: 5  Chief Complaint: pancreatic mass  Brief History  56 yo female with a pmh not limited to CVA, chroinc pain, HLD, HTN, colon polyps. Seen in consultation by [place] on 11/14 after being admitted for abdominal pain, weight loss and pancreatic mass on CT scan   Assessment    # Pancreatic mass.  EUS >> Mass in pancreatic body, (cytology pending), two malignant-appearing lymph nodes in the porta hepatis region. Endosonographic appearance is consistent withmetastatic pancreatic adenocarcinoma. These were not sampled due to proximity to collateral vessels in the portal region  # Erosive gastropathy  # Gastric polyp  Plan:    Await cytology results and await path results. Awaiting port placement for chemo. Oncology saw her yesterday Protonix 40 mg twice daily for 34-month and then go to once daily thereafter.   Subjective   Feels okay. Abdomen a little sore but no significant pain. Awaiting port placement   Objective   Endoscopic studies:   11/16 EUS -EGD Impression: - No gross lesions in the entire esophagus. Z-line irregular, 34 cm from the incisors. - 2 cm hiatal hernia. - Erosive gastropathy with no bleeding and no stigmata of recent bleeding. - A single gastric polyp. Resected and retrieved. - No other gross lesions in the entire stomach. Biopsied. - Mucosal variance in the duodenum. Biopsied. - Normal major papilla. EUS Impression: - A mass was identified in the pancreatic body. Cytology results are pending. However, the endosonographic appearance is highly suspicious for adenocarcinoma. This was staged T4 N1 Mx by endosonographic criteria. The staging applies if malignancy is confirmed. Fine needle biopsy performed of the mass. - Two malignant-appearing lymph nodes were visualized in the porta hepatis region. Tissue has not been obtained. However, the endosonographic appearance is consistent with metastatic pancreatic  adenocarcinoma. These were not sampled due to proximity to collateral vessels in the portal region   Imaging:  CT CHEST W CONTRAST  Result Date: 08/01/2022 CLINICAL DATA:  Weight loss, unintended metastatic workup EXAM: CT CHEST WITH CONTRAST TECHNIQUE: Multidetector CT imaging of the chest was performed during intravenous contrast administration. RADIATION DOSE REDUCTION: This exam was performed according to the departmental dose-optimization program which includes automated exposure control, adjustment of the mA and/or kV according to patient size and/or use of iterative reconstruction technique. CONTRAST:  777mOMNIPAQUE IOHEXOL 300 MG/ML  SOLN COMPARISON:  MRI abdomen 07/30/2022, CT abdomen pelvis 07/30/2022 FINDINGS: Cardiovascular: Normal heart size. No significant pericardial effusion. The thoracic aorta is normal in caliber. No atherosclerotic plaque of the thoracic aorta. No coronary artery calcifications. The main pulmonary artery is normal in caliber. No central or segmental pulmonary embolus. Mediastinum/Nodes: No enlarged mediastinal, hilar, or axillary lymph nodes. Thyroid gland, trachea, and esophagus demonstrate no significant findings. Lungs/Pleura: No focal consolidation. No pulmonary nodule. No pulmonary mass. No pleural effusion. No pneumothorax. Upper Abdomen: Redemonstration of a pancreatic mass better evaluated on MRI abdomen 07/30/2022. nonobstructive nephrolithiasis. Musculoskeletal: No chest wall abnormality. Coarsely calcified lesions within the breast may be related to prior trauma. No suspicious lytic or blastic osseous lesions. No acute displaced fracture. Multilevel degenerative changes of the spine. IMPRESSION: 1. No intrathoracic metastatic disease. 2. No central or segmental pulmonary embolus. 3. No acute intrathoracic abnormality. 4. Nonobstructive nephrolithiasis. 5. Redemonstration of a pancreatic mass better evaluated on MRI abdomen 07/30/2022. Electronically Signed    By: MoIven Finn.D.   On: 08/01/2022 20:35   MR ABDOMEN W WO CONTRAST  Result Date: 07/30/2022  CLINICAL DATA:  Pancreatic cancer, for staging EXAM: MRI ABDOMEN WITHOUT AND WITH CONTRAST TECHNIQUE: Multiplanar multisequence MR imaging of the abdomen was performed both before and after the administration of intravenous contrast. CONTRAST:  42m GADAVIST GADOBUTROL 1 MMOL/ML IV SOLN COMPARISON:  CT abdomen/pelvis dated 07/30/2022 and 06/01/2022. FINDINGS: Lower chest: Lung bases are clear. Hepatobiliary: Liver is within normal limits. No suspicious/enhancing hepatic lesions. Status post cholecystectomy. No intrahepatic ductal dilatation. Common duct measures 8 mm. No choledocholithiasis is seen. Pancreas: 3.7 x 4.7 cm hypoenhancing mass in the pancreatic body/tail (series 20/image 39), compatible with pancreatic adenocarcinoma. Atrophy of distal pancreatic tail with associated ductal dilatation. Spleen:  Within normal limits. Adrenals/Urinary Tract:  Adrenal glands are within normal limits. Subcentimeter bilateral renal cysts, measuring up to 6 mm in the right upper kidney (series 4/image 12), benign (Bosniak I). No follow-up is recommended. No hydronephrosis. Stomach/Bowel: Stomach is within normal limits. Visualized bowel is grossly unremarkable. Vascular/Lymphatic:  No evidence of abdominal aortic aneurysm. Encasement of the proximal celiac artery (series 20/image 37). SMA is uninvolved. 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 Small upper abdominal lymph nodes on diffusion imaging (series 6/image 12), including a 7 mm short axis node in the porta hepatis (series 20/image 30), within the upper limits of normal. Other:  No abdominal ascites. Musculoskeletal: No focal osseous lesions. IMPRESSION: 3.7 x 4.7 cm hypoenhancing mass in the pancreatic body/tail, compatible with pancreatic adenocarcinoma. Encasement of the proximal celiac artery. 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 No findings suspicious for metastatic disease in the abdomen. Electronically Signed   By: SJulian HyM.D.   On: 07/30/2022 23:57   MR 3D Recon At Scanner  Result Date: 07/30/2022 CLINICAL DATA:  Pancreatic cancer, for staging EXAM: MRI ABDOMEN WITHOUT AND WITH CONTRAST TECHNIQUE: Multiplanar multisequence MR imaging of the abdomen was performed both before and after the administration of intravenous contrast. CONTRAST:  759mGADAVIST GADOBUTROL 1 MMOL/ML IV SOLN COMPARISON:  CT abdomen/pelvis dated 07/30/2022 and 06/01/2022. FINDINGS: Lower chest: Lung bases are clear. Hepatobiliary: Liver is within normal limits. No suspicious/enhancing hepatic lesions. Status post cholecystectomy. No intrahepatic ductal dilatation. Common duct measures 8 mm. No choledocholithiasis is seen. Pancreas: 3.7 x 4.7 cm hypoenhancing mass in the pancreatic body/tail (series 20/image 39), compatible with pancreatic adenocarcinoma. Atrophy of distal pancreatic tail with associated ductal dilatation. Spleen:  Within normal limits. Adrenals/Urinary Tract:  Adrenal glands are within normal limits. Subcentimeter bilateral renal cysts, measuring up to 6 mm in the right upper kidney (series 4/image 12), benign (Bosniak I). No follow-up is recommended. No hydronephrosis. Stomach/Bowel: Stomach is within normal limits. Visualized bowel is grossly unremarkable. Vascular/Lymphatic:  No evidence of abdominal aortic aneurysm. Encasement of the proximal celiac artery (series 20/image 37). SMA is uninvolved. 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 Small upper abdominal lymph nodes on diffusion imaging (series 6/image 12), including a 7 mm short axis node in the porta hepatis (series 20/image 30), within the upper limits of normal. Other:  No abdominal ascites. Musculoskeletal: No focal osseous lesions. IMPRESSION: 3.7 x 4.7 cm  hypoenhancing mass in the pancreatic body/tail, compatible with pancreatic adenocarcinoma. Encasement of the proximal celiac artery. 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 No findings suspicious for metastatic disease in the abdomen. Electronically Signed   By: SrJulian Hy.D.   On: 07/30/2022 23:57   CT  ABDOMEN PELVIS WO CONTRAST  Result Date: 07/30/2022 CLINICAL DATA:  A 56 year old female presents following diagnosis of pancreatic mass for further evaluation due to continued abdominal cramping and pain. * Tracking Code: BO * EXAM: CT ABDOMEN AND PELVIS WITHOUT CONTRAST TECHNIQUE: Multidetector CT imaging of the abdomen and pelvis was performed following the standard protocol without IV contrast. RADIATION DOSE REDUCTION: This exam was performed according to the departmental dose-optimization program which includes automated exposure control, adjustment of the mA and/or kV according to patient size and/or use of iterative reconstruction technique. COMPARISON:  w June 01, 2022 FINDINGS: Lower chest: Lung bases are clear. No effusion. No consolidative changes. Hepatobiliary: Post cholecystectomy. No focal, suspicious hepatic lesion within limitations of the current exam. Pancreas: Masslike appearance of the pancreatic body and neck may measure as much as 5.7 x 3.2 cm and is associated with peripheral pancreatic atrophy and underlying ductal dilation. There is some adjacent peripancreatic stranding. Suspect encasement of the celiac axis. Numerous upper abdominal venous collaterals are noted likely related to occlusion of the splenic portal confluence based on the appearance of this masslike area in the pancreatic parenchyma. Spleen: Normal. Adrenals/Urinary Tract: Adrenal glands are normal. Renal contours are smooth. No hydronephrosis. No ureteral calculi. Stable appearance of intrarenal calculi, largest on the RIGHT measuring approximately 5 mm. Urinary  bladder is collapsed. Stomach/Bowel: No acute gastrointestinal process. Appendix is normal. Distal stomach and proximal duodenum inseparable from the suspected mass in the pancreas. Vascular/Lymphatic: Aortic atherosclerosis. No sign of aneurysm. Smooth contour of the IVC. There is no gastrohepatic or hepatoduodenal ligament lymphadenopathy. No retroperitoneal or mesenteric lymphadenopathy. No pelvic sidewall lymphadenopathy. Atherosclerotic changes are mild with calcification. Vascular structures show limited assessment due to lack of intravenous contrast. 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 Other: No ascites. Musculoskeletal: No acute or significant osseous findings. IMPRESSION: 1. 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 Difficult to exclude the possibility of superimposed pancreatitis. Suggest follow-up with MRI with without contrast, GI/surgical referral with endoscopic evaluation when possible. 2. Stable appearance of intrarenal calculi, largest on the RIGHT measuring approximately 5 mm. 3. 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 4. Aortic atherosclerosis. Aortic Atherosclerosis (ICD10-I70.0). Electronically Signed   By: Zetta Bills M.D.   On: 07/30/2022 12:46    Lab Results: Recent Labs    08/02/22 1000  WBC 14.5*  HGB 13.7  HCT 40.9  PLT 160   BMET Recent Labs    08/01/22 1418 08/02/22 1000  NA  --  141  K 3.3* 3.2*  CL  --  107  CO2  --  28  GLUCOSE  --  95  BUN  --  <5*  CREATININE  --  0.53  CALCIUM  --  9.3   LFT Recent Labs    08/02/22 1000  PROT 6.1*  ALBUMIN 2.9*  AST 28  ALT 21  ALKPHOS 53  BILITOT 1.3*  BILIDIR 0.3*   PT/INR Recent Labs    07/31/22 1255  LABPROT 13.1  INR 1.0    Scheduled inpatient medications:   diphenhydrAMINE  50 mg Oral Once   Or   diphenhydrAMINE  50 mg Intravenous  Once   enoxaparin (LOVENOX) injection  40 mg Subcutaneous Q24H   lactose free nutrition  237 mL Oral BID BM   multivitamin with minerals  1 tablet Oral Daily   nicotine  21 mg Transdermal Daily   pantoprazole  40 mg Oral BID AC   potassium chloride  40 mEq Oral Once   Continuous inpatient infusions:   lactated ringers 75 mL/hr at 08/02/22 1634   PRN inpatient medications: acetaminophen **OR** acetaminophen, alum & mag hydroxide-simeth, hydrALAZINE, magic mouthwash w/lidocaine, morphine injection, ondansetron **OR** ondansetron (ZOFRAN) IV, oxyCODONE, promethazine (PHENERGAN) injection (IM or IVPB)  Vital signs in last 24 hours: Temp:  [97.4 F (36.3 C)-98.1 F (36.7 C)] 98.1 F (36.7 C) (11/17 0508) Pulse Rate:  [62-81] 80 (11/17 0508) Resp:  [16-20] 18 (11/17 0508) BP: (112-166)/(47-86) 143/63 (11/17 0508) SpO2:  [96 %-100 %] 96 % (11/17 0508) Last BM Date : 07/30/22  Intake/Output Summary (Last 24 hours) at 08/03/2022 0924 Last data filed at 08/02/2022 1634 Gross per 24 hour  Intake 781.25 ml  Output --  Net 781.25 ml    Intake/Output from previous day: 11/16 0701 - 11/17 0700 In: 781.3 [I.V.:781.3] Out: -  Intake/Output this shift: No intake/output data recorded.   Physical Exam:  General: Alert female in NAD Heart:  Regular rate and rhythm. No lower extremity edema Pulmonary: Normal respiratory effort Abdomen: Soft, nondistended, mild mid upper abdominal tenderness.  Normal bowel sounds.  Neurologic: Alert and oriented Psych: Pleasant. Cooperative.    Principal Problem:   Pancreatic mass Active Problems:   Tobacco abuse   Essential hypertension   Hyperlipidemia   Pilocytic astrocytoma (HCC)   Unintentional weight loss   Generalized abdominal pain   Nausea and vomiting   Protein-calorie malnutrition, severe     LOS: 4 days   Tye Savoy ,NP 08/03/2022, 9:24 AM

## 2022-08-03 NOTE — Consult Note (Signed)
[facility] Surgery Consult Note  Kathleen Stone 11-Mar-1966  [id number].    Requesting MD: Shawna Clamp Chief Complaint/Reason for Consult: pancreatic cancer  HPI:  Kathleen Stone is a 56 y.o. 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 Symptoms initially started around February 2023. She had undergone CT abdomen/ pelvis in September 2023 when she presented to the ED with abdominal pain.  This showed focal soft tissue thickening with stranding and inflammation centered at the body of the pancreas, atrophy of the tail and suspected distal ductal dilatation suspicious for an inflammatory pancreatic mass. MRI was recommended but apparently her insurance would not cover for this so it was not yet completed. Her symptoms progressed with worsening abdominal pain, poor appetite, and total 70lb weight loss. Pain was not severe but persistent. She did have some n/v just prior to coming to the hospital. Work up in the ED included CT abdomen/ pelvis which showed masslike appearance of the pancreatic body and neck ~5.7 x 3.2 cm and associated with peripheral pancreatic atrophy and underlying ductal dilation; there is some adjacent peripancreatic stranding; suspect encasement of the celiac axis; numerous upper abdominal venous collaterals are noted likely related to occlusion of the splenic portal confluence based on the appearance of this masslike area in the pancreatic parenchyma. MRCP obtained next which confirmed 3.7 x 4.7 cm hypoenhancing mass in the pancreatic body/tail compatible with pancreatic adenocarcinoma; atrophy of distal pancreatic tail with associated ductal dilatation. CT chest showed no metastatic disease. Ca 19-9 is 265. CEA 27.4. AFP 4.5. Total bilirubin is 1.3, otherwise LFTs WNL. Patient underwent EUS yesterday by Dr. Rush Landmark which showed a mass in the pancreatic body  highly suspicious for adenocarcinoma; fine needle biopsy performed of the mass; two malignant-appearing lymph nodes visualized in the porta hepatis region (appearance consistent with metastatic pancreatic adenocarcinoma but no tissue obtained due to proximity to collateral vessels in the portal region). Surgical pathology and cytology are pending. Oncology, Dr. Benay Spice, has also seen in consult with plans for outpatient follow-up. Recommend port-a-cath placement which was completed today. General surgery also asked to see.  Abdominal surgical history: open appendectomy, cholecystectomy Anticoagulants: none Smokes 1.5 PPD Denies alcohol or illicit drug use    Family History  Problem Relation Age of Onset   Heart failure Mother    Diabetes Mother    Cancer Father        lung   Lung cancer Father    Breast cancer Other        aunt   Liver cancer Other        aunt   Cancer Maternal Aunt        breast cancer   Colon cancer Neg Hx    Colon polyps Neg Hx    Esophageal cancer Neg Hx    Rectal cancer Neg Hx    Stomach cancer Neg Hx     Past Medical History:  Diagnosis Date   Brain tumor (benign) (HCC)    Chronic lower back pain    Negative MRI of hips bilaterally, neg CT scan of A and P; MRI of T and L spine-> very mild deg changes from T4-5 and T7-8, cervical spondylotic changes at C5-6, very mild disc bulge at L2-3 without spinal stenosis, nl ESR, ANA, RF, HIV, TSH, and B12   DJD (degenerative joint disease) of cervical spine    Family history of adverse reaction to  anesthesia    "sisters PONV"   Galactorrhea of both breasts 2009   R. sided w/ benign papilloma excised in 4/09. L sided in 5/09   Gallstones    Hyperlipidemia    Hypertension    Migraine    "monthly" (11/15/2016)   Neuromuscular disorder ([place])    left arm and side of face    Neuropathy    Thought to be 2/2 nerve compression in the C-spine   Obesity    Personal history of adenomatous colonic polyp    09/2007 -  diminutive adenoma ([place])   PONV (postoperative nausea and vomiting)    Stroke ([place])    2021- brain tumor bx caused ? stroke-    Tobacco abuse     Past Surgical History:  Procedure Laterality Date   APPENDECTOMY     BRAIN BIOPSY     [facility]- 03-2020   BREAST SURGERY Right 12/2007   central duct excision   CHOLECYSTECTOMY N/A 12/08/2019   Procedure: LAPAROSCOPIC CHOLECYSTECTOMY;  Surgeon: Ralene Ok, MD;  Location: [place];  Service: General;  Laterality: N/A;   COLONOSCOPY  2009   COLONOSCOPY  09/14/2020   COLONOSCOPY W/ BIOPSIES AND POLYPECTOMY  09/2007   Dr. [name]. 2 small adenomatous polyps, internal hemorrhoids-Grade 1. Rec  routine colonoscopy at age 51.   COLONOSCOPY W/ BIOPSIES AND POLYPECTOMY     NASAL SEPTOPLASTY W/ TURBINOPLASTY Bilateral 04/2008   Septoplasty with bilateral inferior turbinate reduction; Dr. Lucia Gaskins   SHOULDER OPEN ROTATOR CUFF REPAIR Right     after hurting it by moving heavy objecs    Social History:  reports that she has been smoking cigarettes. She has a 54.00 pack-year smoking history. She has never used smokeless tobacco. She reports that she does not drink alcohol and does not use drugs.  Allergies:  Allergies  Allergen Reactions   Iohexol Hives, Itching and Other (See Comments)    Benadryl 27m PO one hour before iodinated contrast studies.  Jkl, per pt she only has itching when she urinates, [facility]   Medications Prior to Admission  Medication Sig Dispense Refill   calcium elemental as carbonate (TUMS ULTRA 1000) 400 MG chewable tablet Chew 1,000 mg by mouth 3 (three) times daily as needed for heartburn.     metoprolol tartrate (LOPRESSOR) 25 MG tablet Take 1 tablet (25 mg total) by mouth 2 (two) times daily. Take 1 tablet 2 hours before your CT scan (Patient taking differently: Take 25 mg by mouth 2 (two) times daily.) 60 tablet 2   Pancrelipase, Lip-Prot-Amyl, (PANCREAZE) 4200-14200 units CPEP Take 25,000 Units by  mouth in the morning, at noon, and at bedtime. (Patient taking differently: Take 16,800 units of lipase by mouth in the morning, at noon, and at bedtime.) 900 capsule 0   albuterol (PROVENTIL HFA) 108 (90 Base) MCG/ACT inhaler Inhale 1-2 puffs into the lungs every 6 (six) hours as needed for wheezing or shortness of breath. (Patient not taking: Reported on 07/31/2022) 8 g 3   atorvastatin (LIPITOR) 40 MG tablet Take 1 tablet (40 mg total) by mouth daily. (Patient not taking: Reported on 07/31/2022) 90 tablet 3   famotidine (PEPCID) 10 MG tablet Take 1 tablet (10 mg total) by mouth 2 (two) times daily. (Patient not taking: Reported on 07/31/2022) 60 tablet 0   nicotine (NICODERM CQ - DOSED IN MG/24 HOURS) 21 mg/24hr patch Place 1 patch (21 mg total) onto the skin daily. (Patient not taking: Reported on 07/31/2022) 30  patch 1   nitroGLYCERIN (NITROSTAT) 0.4 MG SL tablet Place 1 tablet (0.4 mg total) under the tongue every 5 (five) minutes as needed for chest pain. (Patient not taking: Reported on 07/31/2022) 25 tablet 1   norethindrone (AYGESTIN) 5 MG tablet Take 5 mg by mouth daily. (Patient not taking: Reported on 07/31/2022)     ondansetron (ZOFRAN) 4 MG tablet Take 1 tablet (4 mg total) by mouth every 8 (eight) hours as needed for nausea or vomiting. (Patient not taking: Reported on 07/31/2022) 30 tablet 1   potassium chloride SA (KLOR-CON M) 20 MEQ tablet Take 1 tablet (20 mEq total) by mouth daily for 3 days. (Patient not taking: Reported on 07/31/2022) 3 tablet 0   predniSONE (DELTASONE) 50 MG tablet Take 1 tab 13 hrs before, 1 tab 7 hr before and 1 tab 1 hr before your CT scan (Patient not taking: Reported on 07/31/2022) 3 tablet 0   pregabalin (LYRICA) 50 MG capsule Take 1 capsule (50 mg total) by mouth 3 (three) times daily. Start taking it twice daily for 1 week and increase to three times daily after that. (Patient not taking: Reported on 07/31/2022) 90 capsule 2    Prior to Admission  medications   Medication Sig Start Date End Date Taking? Authorizing Provider  calcium elemental as carbonate (TUMS ULTRA 1000) 400 MG chewable tablet Chew 1,000 mg by mouth 3 (three) times daily as needed for heartburn.   Yes [provider]  metoprolol tartrate (LOPRESSOR) 25 MG tablet Take 1 tablet (25 mg total) by mouth 2 (two) times daily. Take 1 tablet 2 hours before your CT scan Patient taking differently: Take 25 mg by mouth 2 (two) times daily. 05/10/22  Yes Serita Butcher, MD  Pancrelipase, Lip-Prot-Amyl, [facility]) [phone] units CPEP Take 25,000 Units by mouth in the morning, at noon, and at bedtime. Patient taking differently: Take 16,800 units of lipase by mouth in the morning, at noon, and at bedtime. 06/19/22  Yes Delene Ruffini, MD  albuterol (PROVENTIL HFA) 108 (90 Base) MCG/ACT inhaler Inhale 1-2 puffs into the lungs every 6 (six) hours as needed for wheezing or shortness of breath. Patient not taking: Reported on 07/31/2022 11/01/21   Atway, Rayann N, DO  atorvastatin (LIPITOR) 40 MG tablet Take 1 tablet (40 mg total) by mouth daily. Patient not taking: Reported on 07/31/2022 03/06/22   Wayland Denis, MD  famotidine (PEPCID) 10 MG tablet Take 1 tablet (10 mg total) by mouth 2 (two) times daily. Patient not taking: Reported on 07/31/2022 03/13/22   Masters, Joellen Jersey, DO  nicotine (NICODERM CQ - DOSED IN MG/24 HOURS) 21 mg/24hr patch Place 1 patch (21 mg total) onto the skin daily. Patient not taking: Reported on 07/31/2022 03/13/22 03/13/23  Masters, Joellen Jersey, DO  nitroGLYCERIN (NITROSTAT) 0.4 MG SL tablet Place 1 tablet (0.4 mg total) under the tongue every 5 (five) minutes as needed for chest pain. Patient not taking: Reported on 07/31/2022 11/01/21 07/31/22  Atway, Jeananne Rama, DO  norethindrone (AYGESTIN) 5 MG tablet Take 5 mg by mouth daily. Patient not taking: Reported on 07/31/2022 05/11/22   [provider]  ondansetron (ZOFRAN) 4 MG tablet Take 1 tablet (4 mg  total) by mouth every 8 (eight) hours as needed for nausea or vomiting. Patient not taking: Reported on 07/31/2022 06/19/22 06/19/23  Delene Ruffini, MD  potassium chloride SA (KLOR-CON M) 20 MEQ tablet Take 1 tablet (20 mEq total) by mouth daily for 3 days. Patient not taking: Reported on 07/31/2022  05/21/22 07/31/22  Curatolo, Adam, DO  predniSONE (DELTASONE) 50 MG tablet Take 1 tab 13 hrs before, 1 tab 7 hr before and 1 tab 1 hr before your CT scan Patient not taking: Reported on 07/31/2022 04/13/22   Jerline Pain, MD  pregabalin (LYRICA) 50 MG capsule Take 1 capsule (50 mg total) by mouth 3 (three) times daily. Start taking it twice daily for 1 week and increase to three times daily after that. Patient not taking: Reported on 07/31/2022 02/21/22 07/31/22  Idamae Schuller, MD    Blood pressure (!) 143/63, pulse 80, temperature 98.1 F (36.7 C), resp. rate 18, height _0  (1.676 m), weight 74.8 kg, last menstrual period 02/07/2016, SpO2 96 %. Physical Exam: General: pleasant, WD/WN female who is laying in bed in NAD HEENT: head is normocephalic, atraumatic.  Sclera are noninjected.  Pupils equal and round.  Ears and nose without any masses or lesions.  Mouth is pink and moist. Dentition fair Heart: regular, rate, and rhythm Lungs: CTAB, no wheezes, rhonchi, or rales noted.  Respiratory effort nonlabored on room air Abd: well healed lower midline incision. soft, ND, +BS, no masses, hernias, or organomegaly. Mild upper abdominal tenderness without rebound or guarding Skin: warm and dry with no masses, lesions, or rashes Neuro: MAEs, no gross motor or sensory deficits BUE/BLE  Results for orders placed or performed during the hospital encounter of 07/30/22 (from the past 48 hour(s))  Potassium     Status: Abnormal   Collection Time: 08/01/22  2:18 PM  Result Value Ref Range   Potassium 3.3 (L) 3.5 - 5.1 mmol/L    Comment: Performed at [facility], [place] [address],  [place], [place] [address]  CBC     Status: Abnormal   Collection Time: 08/02/22 10:00 AM  Result Value Ref Range   WBC 14.5 (H) 4.0 - 10.5 K/uL   RBC 4.74 3.87 - 5.11 MIL/uL   Hemoglobin 13.7 12.0 - 15.0 g/dL   HCT 40.9 36.0 - 46.0 %   MCV 86.3 80.0 - 100.0 fL   MCH 28.9 26.0 - 34.0 pg   MCHC 33.5 30.0 - 36.0 g/dL   RDW 14.9 11.5 - 15.5 %   Platelets 160 150 - 400 K/uL   nRBC 0.0 0.0 - 0.2 %    Comment: Performed at [facility], [place] [address], [place], [place] [address]  Comprehensive metabolic panel     Status: Abnormal   Collection Time: 08/02/22 10:00 AM  Result Value Ref Range   Sodium 141 135 - 145 mmol/L   Potassium 3.2 (L) 3.5 - 5.1 mmol/L   Chloride 107 98 - 111 mmol/L   CO2 28 22 - 32 mmol/L   Glucose, Bld 95 70 - 99 mg/dL    Comment: Glucose reference range applies only to samples taken after fasting for at least 8 hours.   BUN <5 (L) 6 - 20 mg/dL   Creatinine, Ser 0.53 0.44 - 1.00 mg/dL   Calcium 9.3 8.9 - 10.3 mg/dL   Total Protein 6.1 (L) 6.5 - 8.1 g/dL   Albumin 2.9 (L) 3.5 - 5.0 g/dL   AST 28 15 - 41 U/L   ALT 21 0 - 44 U/L   Alkaline Phosphatase 53 38 - 126 U/L   Total Bilirubin 1.3 (H) 0.3 - 1.2 mg/dL   GFR, Estimated >60 >60 mL/min    Comment: (NOTE) Calculated using the CKD-EPI Creatinine Equation (2021)    Anion gap 6  5 - 15    Comment: Performed at [facility], [place] [address], [place],  [address]  Bilirubin, direct     Status: Abnormal   Collection Time: 08/02/22 10:00 AM  Result Value Ref Range   Bilirubin, Direct 0.3 (H) 0.0 - 0.2 mg/dL    Comment: Performed at [facility], [place] [address], [place],  [address]   CT CHEST W CONTRAST  Result Date: 08/01/2022 CLINICAL DATA:  Weight loss, unintended metastatic workup EXAM: CT CHEST WITH CONTRAST TECHNIQUE: Multidetector CT imaging of the chest was performed during intravenous contrast administration. RADIATION DOSE REDUCTION: This  exam was performed according to the departmental dose-optimization program which includes automated exposure control, adjustment of the mA and/or kV according to patient size and/or use of iterative reconstruction technique. CONTRAST:  68m OMNIPAQUE IOHEXOL 300 MG/ML  SOLN COMPARISON:  MRI abdomen 07/30/2022, CT abdomen pelvis 07/30/2022 FINDINGS: Cardiovascular: Normal heart size. No significant pericardial effusion. The thoracic aorta is normal in caliber. No atherosclerotic plaque of the thoracic aorta. No coronary artery calcifications. The main pulmonary artery is normal in caliber. No central or segmental pulmonary embolus. Mediastinum/Nodes: No enlarged mediastinal, hilar, or axillary lymph nodes. Thyroid gland, trachea, and esophagus demonstrate no significant findings. Lungs/Pleura: No focal consolidation. No pulmonary nodule. No pulmonary mass. No pleural effusion. No pneumothorax. Upper Abdomen: Redemonstration of a pancreatic mass better evaluated on MRI abdomen 07/30/2022. nonobstructive nephrolithiasis. Musculoskeletal: No chest wall abnormality. Coarsely calcified lesions within the breast may be related to prior trauma. No suspicious lytic or blastic osseous lesions. No acute displaced fracture. Multilevel degenerative changes of the spine. IMPRESSION: 1. No intrathoracic metastatic disease. 2. No central or segmental pulmonary embolus. 3. No acute intrathoracic abnormality. 4. Nonobstructive nephrolithiasis. 5. Redemonstration of a pancreatic mass better evaluated on MRI abdomen 07/30/2022. Electronically Signed   By: MIven FinnM.D.   On: 08/01/2022 20:35    Anti-infectives (From admission, onward)    None        Assessment/Plan Pancreas mass - CT abdomen/ pelvis 11/13 showed masslike appearance of the pancreatic body and neck ~5.7 x 3.2 cm and associated with peripheral pancreatic atrophy and underlying ductal dilation; there is some adjacent peripancreatic stranding; suspect  encasement of the celiac axis; numerous upper abdominal venous collaterals are noted likely related to occlusion of the splenic portal confluence based on the appearance of this masslike area in the pancreatic parenchyma - MRCP 11/13 confirmed 3.7 x 4.7 cm hypoenhancing mass in the pancreatic body/tail compatible with pancreatic adenocarcinoma; atrophy of distal pancreatic tail with associated ductal dilatation.  - CT chest showed no metastatic disease - Ca 19-9 elevated at 265, CEA 27.4, AFP 4.5, Total bilirubin is 1.3, otherwise LFTs WNL - S/p EUS 11/16 by Dr. MRush Landmarkwhich showed a mass in the pancreatic body highly suspicious for adenocarcinoma; fine needle biopsy performed of the mass; two malignant-appearing lymph nodes visualized in the porta hepatis region (appearance consistent with metastatic pancreatic adenocarcinoma but no tissue obtained due to proximity to collateral vessels in the portal region). Surgical pathology and cytology are pending - Oncology, Dr. SBenay Spice following with plans for outpatient follow-up - S/p port-a-cath placement 11/17 - Case and imaging reviewed with hepatobiliary surgeon, Dr. AZenia Resides Unfortunately the celiac trunk is completely encased as well as the hepatic artery, so this is not a resectable mass. Patient is not a candidate for surgery.  This was discussed with the patient. She will follow up with Dr. SBenay Spiceand we can  see how she responds to chemotherapy.  [place] for discharge from surgical standpoint.  ID - none VTE - lovenox FEN - IVF, regular diet Foley - none  HTN HLD Pilocystic astrocytoma (03/2020) Neuropathy Tobacco abuse   I reviewed Consultant gastroenterology notes, hospitalist notes, last 24 h vitals and pain scores, last 48 h intake and output, last 24 h labs and trends, and last 24 h imaging results.   Wellington [place], [place] [facility] 08/03/2022, 11:01 AM Please see Amion for pager number during day hours  7:00am-4:30pm

## 2022-08-03 NOTE — Progress Notes (Unsigned)
Genetic order placed for patient upcoming appt at [facility]

## 2022-08-05 ENCOUNTER — Encounter (HOSPITAL_COMMUNITY): Payer: Self-pay | Admitting: Gastroenterology

## 2022-08-07 ENCOUNTER — Encounter: Payer: Self-pay | Admitting: Internal Medicine

## 2022-08-07 ENCOUNTER — Encounter: Payer: Self-pay | Admitting: Gastroenterology

## 2022-08-07 DIAGNOSIS — A048 Other specified bacterial intestinal infections: Secondary | ICD-10-CM | POA: Insufficient documentation

## 2022-08-07 HISTORY — DX: Other specified bacterial intestinal infections: A04.8

## 2022-08-08 ENCOUNTER — Other Ambulatory Visit: Payer: Self-pay | Admitting: *Deleted

## 2022-08-08 ENCOUNTER — Encounter: Payer: Self-pay | Admitting: *Deleted

## 2022-08-08 ENCOUNTER — Inpatient Hospital Stay: Payer: Commercial Managed Care - HMO | Attending: Oncology | Admitting: Oncology

## 2022-08-08 ENCOUNTER — Other Ambulatory Visit: Payer: Self-pay

## 2022-08-08 VITALS — BP 156/84 | HR 104 | Temp 98.1°F | Resp 18 | Ht 66.0 in | Wt 170.0 lb

## 2022-08-08 DIAGNOSIS — R109 Unspecified abdominal pain: Secondary | ICD-10-CM | POA: Diagnosis not present

## 2022-08-08 DIAGNOSIS — Z800 Family history of malignant neoplasm of digestive organs: Secondary | ICD-10-CM | POA: Insufficient documentation

## 2022-08-08 DIAGNOSIS — Z8719 Personal history of other diseases of the digestive system: Secondary | ICD-10-CM | POA: Insufficient documentation

## 2022-08-08 DIAGNOSIS — C258 Malignant neoplasm of overlapping sites of pancreas: Secondary | ICD-10-CM

## 2022-08-08 DIAGNOSIS — Z801 Family history of malignant neoplasm of trachea, bronchus and lung: Secondary | ICD-10-CM | POA: Diagnosis not present

## 2022-08-08 DIAGNOSIS — N950 Postmenopausal bleeding: Secondary | ICD-10-CM | POA: Diagnosis not present

## 2022-08-08 DIAGNOSIS — A048 Other specified bacterial intestinal infections: Secondary | ICD-10-CM

## 2022-08-08 DIAGNOSIS — Z803 Family history of malignant neoplasm of breast: Secondary | ICD-10-CM | POA: Insufficient documentation

## 2022-08-08 DIAGNOSIS — R197 Diarrhea, unspecified: Secondary | ICD-10-CM | POA: Insufficient documentation

## 2022-08-08 DIAGNOSIS — I10 Essential (primary) hypertension: Secondary | ICD-10-CM | POA: Insufficient documentation

## 2022-08-08 DIAGNOSIS — R634 Abnormal weight loss: Secondary | ICD-10-CM | POA: Diagnosis not present

## 2022-08-08 DIAGNOSIS — C251 Malignant neoplasm of body of pancreas: Secondary | ICD-10-CM | POA: Insufficient documentation

## 2022-08-08 MED ORDER — PREDNISONE 10 MG PO TABS: 10.0000 mg | ORAL_TABLET | Freq: Every day | ORAL | 1 refills | Status: DC

## 2022-08-08 MED ORDER — LIDOCAINE-PRILOCAINE 2.5-2.5 % EX CREA: 1.0000 | TOPICAL_CREAM | CUTANEOUS | 0 refills | Status: DC | PRN

## 2022-08-08 NOTE — Progress Notes (Signed)
PATIENT NAVIGATOR PROGRESS NOTE  Name: DARLENE BROZOWSKI Date: 08/08/2022 MRN: [id number]  DOB: May 23, 1966   Reason for visit:  New Patient appt  Comments:  Met with Ms Silvey and her sisters during visit with Dr Benay Spice Referral to [facility] order for genetic testing and Guardant 360 to be drawn on 12/1 with start of treatment Referral placed for Social Work Referral placed with Nutrition Will come for labs, education and first treatment of Gemzar and Abraxane on 08/17/22 Abraxane and Gemzar information given Given contact information to call with any issues or questions    Time spent counseling/coordinating care: > 60 minutes

## 2022-08-08 NOTE — Progress Notes (Signed)
START ON PATHWAY REGIMEN - Pancreatic Adenocarcinoma     A cycle is every 28 days:     Nab-paclitaxel (protein bound)      Gemcitabine   **Always confirm dose/schedule in your pharmacy ordering system**  Patient Characteristics: Locally Advanced, Anatomically Unresectable, M0, First Line, PS ? 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 

## 2022-08-08 NOTE — Progress Notes (Signed)
[place] OFFICE PROGRESS NOTE   Diagnosis: Pancreas cancer  INTERVAL HISTORY:   Kathleen Stone was discharged from the hospital 08/03/2022.  She underwent Port-A-Cath placement 08/03/2022.  She is here today with 2 sisters.  Another sister is present by telephone. Kathleen Stone has intermittent abdominal pain and nausea.  She reports a few episodes of emesis.  She stays on the sofa most of the day.  She is ambulatory in the home and capable of self-care.  She feels weak.    Objective:  Vital signs in last 24 hours:  Blood pressure (!) 156/84, pulse (!) 104, temperature 98.1 F (36.7 C), temperature source Oral, resp. rate 18, height _0  (1.676 m), weight 170 lb (77.1 kg), last menstrual period 02/07/2016, SpO2 98 %.    HEENT: No thrush, thick white coat over the tongue, the mucous membranes are moist Resp: Scattered coarse rhonchi, no respiratory distress Cardio: Regular rate and rhythm GI: Tender in the mid and left upper abdomen, no mass, no hepatosplenomegaly Vascular: No leg edema  Portacath/PICC-without erythema  Lab Results:  Lab Results  Component Value Date   WBC 14.5 (H) 08/02/2022   HGB 13.7 08/02/2022   HCT 40.9 08/02/2022   MCV 86.3 08/02/2022   PLT 160 08/02/2022   NEUTROABS 8.1 (H) 05/21/2022    CMP  Lab Results  Component Value Date   NA 141 08/02/2022   K 3.2 (L) 08/02/2022   CL 107 08/02/2022   CO2 28 08/02/2022   GLUCOSE 95 08/02/2022   BUN <5 (L) 08/02/2022   CREATININE 0.53 08/02/2022   CALCIUM 9.3 08/02/2022   PROT 6.1 (L) 08/02/2022   ALBUMIN 2.9 (L) 08/02/2022   AST 28 08/02/2022   ALT 21 08/02/2022   ALKPHOS 53 08/02/2022   BILITOT 1.3 (H) 08/02/2022   GFRNONAA >60 08/02/2022   GFRAA >60 02/29/2020    Lab Results  Component Value Date   CEA1 27.4 (H) 07/31/2022   KDT267 265 (H) 07/31/2022       Medications: I have reviewed the patient's current medications.   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 CT  abdomen/pelvis 07/30/2022-body/neck pancreas mass with peripheral pancreatic atrophy and duct dilation, suspected encasement of the celiac axis, uterine fibroids MRI abdomen 07/30/2022-3.7 x 4.7 cm pancreas body/tail mass with encasement of the proximal celiac artery, occlusion with collateralization at the confluence of the SMV and portal vein, no evidence of metastatic disease CT chest 08/01/2022-no intrathoracic metastases Elevated CA 19-9 EUS 08/02/2022-gastric polyp, gastric erosions, 32 x 33 mm pancreas body mass, invasion of the celiac trunk, invasion of the splenoportal confluence, no SMA invasion, 2 malignant appearing nodes in the porta hepatis,T4N1 by endoscopic criteria, FNA biopsy-adenocarcinoma   2.  Pilocystic astrocytoma of the dorsal midbrain-biopsy 03/17/2020, followed at [facility] with observation   3.  Anorexia/weight loss secondary to #1 4.  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  Abdominal pain 6.  History of colon polyps 7.  Family history of multiple cancers Pancreas cancer maternal aunt, maternal cousin, maternal grandfather Breast cancer-maternal aunt, maternal cousin Prostate cancer-maternal uncle Lung cancer-father 29.  Hypertension 9.  Postmenopausal vaginal bleeding 10.  H. pylori 08/02/2022     Disposition: Kathleen Stone has been diagnosed with locally advanced pancreas cancer.  The cancer is unresectable.  She understands no therapy will be curative.  She has a borderline performance status (ECOG 2-3).  I discussed treatment options.  I recommend a trial of systemic therapy.  She does not appear to be a  candidate for FOLFIRINOX.  I recommend gemcitabine/Abraxane. We discussed potential toxicities associated with the gemcitabine/Abraxane regimen including the chance of nausea, alopecia, hematologic toxicity, infection, and bleeding.  We discussed the rash and pneumonitis associated with gemcitabine.  We discussed the allergic reaction and neuropathy seen  with Abraxane.  She agrees to proceed.  The plan is to begin gemcitabine/Abraxane on 08/17/2014.  She will be scheduled for an office visit 08/17/2014.  There is a family history of multiple cancers.  She will be referred to the genetics counselor.  She may be a candidate for platinum based therapy and a PARP inhibitor if genetic testing confirms a BRCA variant.  She will begin prednisone as an appetite stimulant.  Peripheral blood will be submitted for Guardant360 testing.  A chemotherapy plan was entered today.  Betsy Coder, MD  08/08/2022  3:26 PM

## 2022-08-09 ENCOUNTER — Other Ambulatory Visit: Payer: Self-pay

## 2022-08-11 ENCOUNTER — Other Ambulatory Visit: Payer: Self-pay | Admitting: Oncology

## 2022-08-13 ENCOUNTER — Other Ambulatory Visit: Payer: Self-pay

## 2022-08-13 MED ORDER — TALICIA 250-12.5-10 MG PO CPDR: 4.0000 | DELAYED_RELEASE_CAPSULE | Freq: Three times a day (TID) | ORAL | 0 refills | Status: DC

## 2022-08-14 ENCOUNTER — Telehealth: Payer: Self-pay | Admitting: Gastroenterology

## 2022-08-14 NOTE — Telephone Encounter (Signed)
Inbound call from patient stating the pharmacy does not have amoxixillin in [place]. Advised patient that she can wait for the pharmacy to order medication. Patient advised understanding.  Thank you

## 2022-08-15 ENCOUNTER — Other Ambulatory Visit: Payer: Self-pay

## 2022-08-15 ENCOUNTER — Ambulatory Visit: Payer: Self-pay

## 2022-08-15 NOTE — Patient Outreach (Signed)
  Care Coordination   Follow Up Visit Note   08/15/2022 Name: Kathleen Stone MRN: [id number] DOB: 28-Mar-1966  Kathleen Stone is a 56 y.o. year old female who sees Masters, Joellen Jersey, DO for primary care. I spoke with  Kathleen Stone by phone today.  What matters to the patients health and wellness today?  Kathleen Stone, is currently doing fairl and will be starting her chemotherapy treatment on Friday at 82 am. Kathleen Stone mentioned that she has a supportive group of family and friends, and has a counseling session scheduled for the 30th. She is also waiting to hear back from social services regarding her Medicaid and food stamp application.   On a positive note, Kathleen Stone has been able to eat more and has put on a couple of pounds. She's had chicken, potato chips, and pudding, and is feeling a bit better because of it.    Goals Addressed             This Visit's Progress    I don't understand why I am losing weight.       Care Coordination Interventions:   Evaluation of current treatment plan and patient's adherence to plan as established by provider Reviewed medications with patient and discussed  Active listening / Reflection utilized  Emotional Support Provided Problem [place] strategies reviewed Verbalization of feelings encouraged         SDOH assessments and interventions completed:  No     Care Coordination Interventions:  Yes, provided   Follow up plan: Follow up call scheduled for 12/14 1030 am    Encounter Outcome:  Pt. Visit Completed   Lazaro Arms RN, BSN, [facility]   Phone: [phone]

## 2022-08-15 NOTE — Patient Instructions (Signed)
Visit Information  Thank you for taking time to visit with me today. Please don't hesitate to contact me if I can be of assistance to you.   Following are the goals we discussed today:   Goals Addressed             This Visit's Progress    I don't understand why I am losing weight.       Care Coordination Interventions:   Evaluation of current treatment plan and patient's adherence to plan as established by provider Reviewed medications with patient and discussed  Active listening / Reflection utilized  Emotional Support Provided Problem [place] strategies reviewed Verbalization of feelings encouraged         Our next appointment is by telephone on 12/14 at 1030 am  Please call the care guide team at [phone] if you need to cancel or reschedule your appointment.   If you are experiencing a Mental Health or [place] or need someone to talk to, please call 1-800-273-TALK (toll free, 24 hour hotline)  Patient verbalizes understanding of instructions and care plan provided today and agrees to view in [facility]. Active MyChart status and patient understanding of how to access instructions and care plan via MyChart confirmed with patient.     Lazaro Arms RN, BSN, [facility]   Phone: [phone]

## 2022-08-15 NOTE — Progress Notes (Signed)
The proposed treatment discussed in conference is for discussion purpose only and is not a binding recommendation.  The patients have not been physically examined, or presented with their treatment options.  Therefore, final treatment plans cannot be decided.  

## 2022-08-16 ENCOUNTER — Other Ambulatory Visit: Payer: Self-pay

## 2022-08-16 ENCOUNTER — Telehealth: Payer: Self-pay

## 2022-08-16 ENCOUNTER — Encounter: Payer: Self-pay | Admitting: Medical Oncology

## 2022-08-16 DIAGNOSIS — C251 Malignant neoplasm of body of pancreas: Secondary | ICD-10-CM

## 2022-08-16 NOTE — Progress Notes (Signed)
MTG-015 - Tissue and Bodily Fluids: Translational Medicine: Discovery and Evaluation of Biomarkers/Pharmacogenomics for the Diagnosis and Personalized Management of Patients  (MT1410-A and MT3505-A)  Outgoing phone call: 1410  LVMOM with patient this afternoon. I called patient yesterday as well, but did not leave a vm due to a generic message response. This afternoon I lvmom and introduced myself, informed her Dr. Benay Spice referred her and I provided patient with a short description about the study, due to patient starting treatment tomorrow and should she be interested in participating, consent and specimen collection would need to be collected tomorrow before start of treatment. Patient was informed that participation is completely voluntary. Patient was asked to return call before 4 today, should she be interested in more information/participation. Patient thanked. My return call number provided.   Maxwell Marion, RN, BSN, [facility] Clinical Research Nurse Lead 08/16/2022 2:32 PM

## 2022-08-16 NOTE — Telephone Encounter (Signed)
Kathleen Stone called about her Kathleen Stone, the cost was about $300. I called left a vm letting her knowing to called Dr Rush Landmark was the provider and she need to reach out to him.

## 2022-08-17 ENCOUNTER — Other Ambulatory Visit: Payer: Self-pay | Admitting: *Deleted

## 2022-08-17 ENCOUNTER — Encounter: Payer: Self-pay | Admitting: Oncology

## 2022-08-17 ENCOUNTER — Inpatient Hospital Stay: Payer: Commercial Managed Care - HMO | Attending: Oncology

## 2022-08-17 ENCOUNTER — Other Ambulatory Visit (HOSPITAL_BASED_OUTPATIENT_CLINIC_OR_DEPARTMENT_OTHER): Payer: Self-pay

## 2022-08-17 ENCOUNTER — Telehealth: Payer: Self-pay

## 2022-08-17 ENCOUNTER — Encounter: Payer: Self-pay | Admitting: *Deleted

## 2022-08-17 ENCOUNTER — Inpatient Hospital Stay: Payer: Commercial Managed Care - HMO

## 2022-08-17 VITALS — BP 106/62 | HR 68 | Temp 97.8°F | Resp 18

## 2022-08-17 DIAGNOSIS — Z5111 Encounter for antineoplastic chemotherapy: Secondary | ICD-10-CM | POA: Diagnosis present

## 2022-08-17 DIAGNOSIS — R109 Unspecified abdominal pain: Secondary | ICD-10-CM | POA: Insufficient documentation

## 2022-08-17 DIAGNOSIS — C251 Malignant neoplasm of body of pancreas: Secondary | ICD-10-CM | POA: Diagnosis present

## 2022-08-17 DIAGNOSIS — E876 Hypokalemia: Secondary | ICD-10-CM | POA: Insufficient documentation

## 2022-08-17 DIAGNOSIS — N950 Postmenopausal bleeding: Secondary | ICD-10-CM | POA: Insufficient documentation

## 2022-08-17 DIAGNOSIS — Z800 Family history of malignant neoplasm of digestive organs: Secondary | ICD-10-CM | POA: Diagnosis not present

## 2022-08-17 DIAGNOSIS — R634 Abnormal weight loss: Secondary | ICD-10-CM | POA: Diagnosis not present

## 2022-08-17 DIAGNOSIS — I10 Essential (primary) hypertension: Secondary | ICD-10-CM | POA: Insufficient documentation

## 2022-08-17 DIAGNOSIS — R197 Diarrhea, unspecified: Secondary | ICD-10-CM | POA: Diagnosis not present

## 2022-08-17 DIAGNOSIS — Z95828 Presence of other vascular implants and grafts: Secondary | ICD-10-CM

## 2022-08-17 DIAGNOSIS — Z8719 Personal history of other diseases of the digestive system: Secondary | ICD-10-CM | POA: Insufficient documentation

## 2022-08-17 DIAGNOSIS — Z801 Family history of malignant neoplasm of trachea, bronchus and lung: Secondary | ICD-10-CM | POA: Insufficient documentation

## 2022-08-17 DIAGNOSIS — C258 Malignant neoplasm of overlapping sites of pancreas: Secondary | ICD-10-CM

## 2022-08-17 LAB — CMP (CANCER CENTER ONLY)
ALT: 41 U/L (ref 0–44)
AST: 26 U/L (ref 15–41)
Albumin: 3.6 g/dL (ref 3.5–5.0)
Alkaline Phosphatase: 52 U/L (ref 38–126)
Anion gap: 8 (ref 5–15)
BUN: 11 mg/dL (ref 6–20)
CO2: 33 mmol/L — ABNORMAL HIGH (ref 22–32)
Calcium: 9.3 mg/dL (ref 8.9–10.3)
Chloride: 103 mmol/L (ref 98–111)
Creatinine: 0.82 mg/dL (ref 0.44–1.00)
GFR, Estimated: >60 mL/min (ref >60)
Glucose, Bld: 110 mg/dL — ABNORMAL HIGH (ref 70–99)
Potassium: 2.8 mmol/L — ABNORMAL LOW (ref 3.5–5.1)
Sodium: 144 mmol/L (ref 135–145)
Total Bilirubin: 1.3 mg/dL — ABNORMAL HIGH (ref 0.3–1.2)
Total Protein: 6.5 g/dL (ref 6.5–8.1)

## 2022-08-17 LAB — CBC WITH DIFFERENTIAL (CANCER CENTER ONLY)
Abs Immature Granulocytes: 0.11 10*3/uL — ABNORMAL HIGH (ref 0.00–0.07)
Basophils Absolute: 0.1 10*3/uL (ref 0.0–0.1)
Basophils Relative: 0 %
Eosinophils Absolute: 0.1 10*3/uL (ref 0.0–0.5)
Eosinophils Relative: 1 %
HCT: 43.4 % (ref 36.0–46.0)
Hemoglobin: 14.4 g/dL (ref 12.0–15.0)
Immature Granulocytes: 1 %
Lymphocytes Relative: 16 %
Lymphs Abs: 2.9 10*3/uL (ref 0.7–4.0)
MCH: 28.9 pg (ref 26.0–34.0)
MCHC: 33.2 g/dL (ref 30.0–36.0)
MCV: 87.1 fL (ref 80.0–100.0)
Monocytes Absolute: 0.7 10*3/uL (ref 0.1–1.0)
Monocytes Relative: 4 %
Neutro Abs: 13.8 10*3/uL — ABNORMAL HIGH (ref 1.7–7.7)
Neutrophils Relative %: 78 %
Platelet Count: 224 10*3/uL (ref 150–400)
RBC: 4.98 MIL/uL (ref 3.87–5.11)
RDW: 16.3 % — ABNORMAL HIGH (ref 11.5–15.5)
WBC Count: 17.6 10*3/uL — ABNORMAL HIGH (ref 4.0–10.5)
nRBC: 0 % (ref 0.0–0.2)

## 2022-08-17 LAB — MAGNESIUM: Magnesium: 1.8 mg/dL (ref 1.7–2.4)

## 2022-08-17 MED ORDER — METRONIDAZOLE 500 MG PO TABS
500.0000 mg | ORAL_TABLET | Freq: Two times a day (BID) | ORAL | 0 refills | Status: AC
  Filled 2022-08-17: qty 20, 10d supply, fill #0

## 2022-08-17 MED ORDER — SODIUM CHLORIDE 0.9 % IV SOLN: Freq: Once | INTRAVENOUS | Status: AC

## 2022-08-17 MED ORDER — BISMUTH SUBSALICYLATE 262 MG PO TABS
2.0000 | ORAL_TABLET | Freq: Four times a day (QID) | ORAL | 0 refills | Status: AC
  Filled 2022-08-17: qty 80, 10d supply, fill #0

## 2022-08-17 MED ORDER — SODIUM CHLORIDE 0.9% FLUSH
10.0000 mL | INTRAVENOUS | Status: DC | PRN
  Administered 2022-08-17: 10 mL

## 2022-08-17 MED ORDER — PROCHLORPERAZINE MALEATE 10 MG PO TABS
10.0000 mg | ORAL_TABLET | Freq: Once | ORAL | Status: AC
  Administered 2022-08-17: 10 mg via ORAL
  Filled 2022-08-17: qty 1

## 2022-08-17 MED ORDER — HEPARIN SOD (PORK) LOCK FLUSH 100 UNIT/ML IV SOLN
500.0000 [IU] | Freq: Once | INTRAVENOUS | Status: AC | PRN
  Administered 2022-08-17: 500 [IU]

## 2022-08-17 MED ORDER — SODIUM CHLORIDE 0.9 % IV SOLN
1000.0000 mg/m2 | Freq: Once | INTRAVENOUS | Status: AC
  Administered 2022-08-17: 1900 mg via INTRAVENOUS
  Filled 2022-08-17: qty 49.97

## 2022-08-17 MED ORDER — PACLITAXEL PROTEIN-BOUND CHEMO INJECTION 100 MG
100.0000 mg/m2 | Freq: Once | INTRAVENOUS | Status: AC
  Administered 2022-08-17: 200 mg via INTRAVENOUS
  Filled 2022-08-17: qty 40

## 2022-08-17 MED ORDER — AMOXICILLIN 500 MG PO CAPS
1000.0000 mg | ORAL_CAPSULE | Freq: Two times a day (BID) | ORAL | 0 refills | Status: AC
  Filled 2022-08-17: qty 40, 10d supply, fill #0

## 2022-08-17 MED ORDER — POTASSIUM CHLORIDE 10 MEQ/100ML IV SOLN
10.0000 meq | INTRAVENOUS | Status: AC
  Administered 2022-08-17 (×2): 10 meq via INTRAVENOUS
  Filled 2022-08-17: qty 100

## 2022-08-17 MED ORDER — POTASSIUM CHLORIDE CRYS ER 10 MEQ PO TBCR: 20.0000 meq | EXTENDED_RELEASE_TABLET | Freq: Two times a day (BID) | ORAL | 1 refills | Status: DC

## 2022-08-17 MED ORDER — PROCHLORPERAZINE MALEATE 10 MG PO TABS
10.0000 mg | ORAL_TABLET | Freq: Four times a day (QID) | ORAL | 1 refills | Status: DC | PRN
  Filled 2022-08-17: qty 30, 8d supply, fill #0

## 2022-08-17 MED ORDER — POTASSIUM CHLORIDE CRYS ER 10 MEQ PO TBCR
20.0000 meq | EXTENDED_RELEASE_TABLET | Freq: Two times a day (BID) | ORAL | 1 refills | Status: DC
  Filled 2022-08-17: qty 120, 30d supply, fill #0

## 2022-08-17 MED ORDER — SODIUM CHLORIDE 0.9% FLUSH
10.0000 mL | INTRAVENOUS | Status: DC | PRN
  Administered 2022-08-17: 10 mL via INTRAVENOUS

## 2022-08-17 NOTE — Patient Instructions (Signed)

## 2022-08-17 NOTE — Telephone Encounter (Signed)
CSW attempted to contact patient per request of nurse navigator to assess psychosocial issues.  Left vm.

## 2022-08-17 NOTE — Patient Instructions (Addendum)
Your potassium level is very low today. Please start KCl tonight taking #2 tablets (10 meq each) twice daily. This has been sent to your [facility]. Return on 12/05 for lab work to check your potassium level again.   Hypokalemia Hypokalemia means that the amount of potassium in the blood is lower than normal. Potassium is a mineral (electrolyte) that helps regulate the amount of fluid in the body. It also stimulates muscle tightening (contraction) and helps nerves work properly. Normally, most of the body's potassium is inside cells, and only a very small amount is in the blood. Because the amount in the blood is so small, minor changes to potassium levels in the blood can be life-threatening. What are the causes? This condition may be caused by: Antibiotic medicine. Diarrhea or vomiting. Taking too much of a medicine that helps you have a bowel movement (laxative) can cause diarrhea and lead to hypokalemia. Chronic kidney disease (CKD). Medicines that help the body get rid of excess fluid (diuretics). Eating disorders, such as anorexia or bulimia. Low magnesium levels in the body. Sweating a lot. What are the signs or symptoms? Symptoms of this condition include: Weakness. Constipation. Fatigue. Muscle cramps. Mental confusion. Skipped heartbeats or irregular heartbeat (palpitations). Tingling or numbness. How is this diagnosed? This condition is diagnosed with a blood test. How is this treated? This condition may be treated by: Taking potassium supplements. Adjusting the medicines that you take. Eating more foods that contain a lot of potassium. If your potassium level is very low, you may need to get potassium through an IV and be monitored in the hospital. Follow these instructions at home: Eating and drinking  Eat a healthy diet. A healthy diet includes fresh fruits and vegetables, whole grains, healthy fats, and lean proteins. If told, eat more foods that contain a lot  of potassium. These include: Nuts, such as peanuts and pistachios. Seeds, such as sunflower seeds and pumpkin seeds. Peas, lentils, and lima beans. Whole grain and bran cereals and breads. Fresh fruits and vegetables, such as apricots, avocado, bananas, cantaloupe, kiwi, oranges, tomatoes, asparagus, and potatoes. Juices, such as orange, tomato, and prune. Lean meats, including fish. Milk and milk products, such as yogurt. General instructions Take over-the-counter and prescription medicines only as told by your health care provider. This includes vitamins, natural food products, and supplements. Keep all follow-up visits. This is important. Contact a health care provider if: You have weakness that gets worse. You feel your heart pounding or racing. You vomit. You have diarrhea. You have diabetes and you have trouble keeping your blood sugar in your target range. Get help right away if: You have chest pain. You have shortness of breath. You have vomiting or diarrhea that lasts for more than 2 days. You faint. These symptoms may be an emergency. Get help right away. Call 911. Do not wait to see if the symptoms will go away. Do not drive yourself to the hospital. Summary Hypokalemia means that the amount of potassium in the blood is lower than normal. This condition is diagnosed with a blood test. Hypokalemia may be treated by taking potassium supplements, adjusting the medicines that you take, or eating more foods that are high in potassium. If your potassium level is very low, you may need to get potassium through an IV and be monitored in the hospital. This information is not intended to replace advice given to you by your health care provider. Make sure you discuss any questions you have with  your health care provider. Document Revised: 05/18/2021 Document Reviewed: 05/18/2021 Elsevier Patient Education  [address]  Discharge  Instructions: Thank you for choosing [facility] to provide your oncology and hematology care.   If you have a lab appointment with the [facility], please go directly to the [facility] and check in at the registration area.   Wear comfortable clothing and clothing appropriate for easy access to any Portacath or PICC line.   We strive to give you quality time with your provider. You may need to reschedule your appointment if you arrive late (15 or more minutes).  Arriving late affects you and other patients whose appointments are after yours.  Also, if you miss three or more appointments without notifying the office, you may be dismissed from the clinic at the provider's discretion.      For prescription refill requests, have your pharmacy contact our office and allow 72 hours for refills to be completed.    Today you received the following chemotherapy and/or immunotherapy agents Abraxane and Gemzar      To help prevent nausea and vomiting after your treatment, we encourage you to take your nausea medication as directed.  BELOW ARE SYMPTOMS THAT SHOULD BE REPORTED IMMEDIATELY: *FEVER GREATER THAN 100.4 F (38 C) OR HIGHER *CHILLS OR SWEATING *NAUSEA AND VOMITING THAT IS NOT CONTROLLED WITH YOUR NAUSEA MEDICATION *UNUSUAL SHORTNESS OF BREATH *UNUSUAL BRUISING OR BLEEDING *URINARY PROBLEMS (pain or burning when urinating, or frequent urination) *BOWEL PROBLEMS (unusual diarrhea, constipation, pain near the anus) TENDERNESS IN MOUTH AND THROAT WITH OR WITHOUT PRESENCE OF ULCERS (sore throat, sores in mouth, or a toothache) UNUSUAL RASH, SWELLING OR PAIN  UNUSUAL VAGINAL DISCHARGE OR ITCHING   Items with * indicate a potential emergency and should be followed up as soon as possible or go to the Emergency Department if any problems should occur.  Please show the CHEMOTHERAPY ALERT CARD or IMMUNOTHERAPY ALERT CARD at check-in to the Emergency Department and triage  nurse.  Should you have questions after your visit or need to cancel or reschedule your appointment, please contact [facility]  Dept: [phone]  and follow the prompts.  Office hours are 8:00 a.m. to 4:30 p.m. Monday - Friday. Please note that voicemails left after 4:00 p.m. may not be returned until the following business day.  We are closed weekends and major holidays. You have access to a nurse at all times for urgent questions. Please call the main number to the clinic Dept: [phone] and follow the prompts.   For any non-urgent questions, you may also contact your provider using MyChart. We now offer e-Visits for anyone 74 and older to request care online for non-urgent symptoms. For details visit [email or web address].   Also download the MyChart app! Go to the app store, search "MyChart", open the app, select [facility], and log in with your MyChart username and password.  Masks are optional in the cancer centers. If you would like for your care team to wear a mask while they are taking care of you, please let them know. You may have one support person who is at least 57 years old accompany you for your appointments.

## 2022-08-17 NOTE — Telephone Encounter (Signed)
-----   Message from Lin Givens, RN sent at 08/17/2022 10:29 AM EST ----- Regarding: FW: H. Pylori tx Now she request prescriptions be sent to [facility] bout that  wendy ----- Message ----- From: Lin Givens, RN Sent: 08/17/2022  10:09 AM EST To: Timothy Lasso, RN; Irving Copas., MD Subject: RE: H. Pylori tx                               Pt [facility] was [facility] of [facility],  she asked it be sent to [facility] on [facility] and if there is any cheaper options.  She is receiving her first treatment today here at [facility]   Let me know if I can assist in any way  Thanks Abigail Butts ----- Message ----- From: Irving Copas., MD Sent: 08/09/2022   6:20 AM EST To: Lin Givens, RN; Timothy Lasso, RN Subject: RE: H. Pylori tx                               [facility], Thanks for reaching out. Chong Sicilian has already been working on this and when she returns next week she will be able to further work on this.  Likely there is a prior authorization but patient will get treated. Thanks. GM ----- Message ----- From: Lin Givens, RN Sent: 08/08/2022   4:20 PM EST To: Timothy Lasso, RN; Irving Copas., MD Subject: Lemmie Evens Pylori tx                                   Dr Benay Spice saw Ms Engebretsen today for evaluation to start tx and saw that she was to start Hy Pylori tx but pt hasn't had meds called in yet.  If she is to start treatment please call [facility]  Thanks Royston Sinner RN [facility]

## 2022-08-17 NOTE — Progress Notes (Signed)
Pharmacist Chemotherapy Monitoring - Initial Assessment    Anticipated start date: 08/17/22   The following has been reviewed per standard work regarding the patient's treatment regimen: The patient's diagnosis, treatment plan and drug doses, and organ/hematologic function Lab orders and baseline tests specific to treatment regimen  The treatment plan start date, drug sequencing, and pre-medications Prior authorization status  Patient's documented medication list, including drug-drug interaction screen and prescriptions for anti-emetics and supportive care specific to the treatment regimen The drug concentrations, fluid compatibility, administration routes, and timing of the medications to be used The patient's access for treatment and lifetime cumulative dose history, if applicable  The patient's medication allergies and previous infusion related reactions, if applicable   Changes made to treatment plan:  N/A  Follow up needed:  N/A   Kathleen Stone, RPH, 08/17/2022  10:26 AM

## 2022-08-17 NOTE — Progress Notes (Signed)
Received request for office note/treatment plan from [facility] case management on patient. Ms. Delong gave permission to send records. Faxed to [phone] att: Darrin Nipper

## 2022-08-17 NOTE — Progress Notes (Signed)
Pt education on Gemzar and Abraxane printed and given to pt today with first chemo tx.

## 2022-08-18 LAB — CANCER ANTIGEN 19-9: CA 19-9: 332 U/mL — ABNORMAL HIGH (ref 0–35)

## 2022-08-19 ENCOUNTER — Other Ambulatory Visit: Payer: Self-pay

## 2022-08-19 ENCOUNTER — Emergency Department (HOSPITAL_BASED_OUTPATIENT_CLINIC_OR_DEPARTMENT_OTHER): Payer: Commercial Managed Care - HMO

## 2022-08-19 ENCOUNTER — Emergency Department (HOSPITAL_BASED_OUTPATIENT_CLINIC_OR_DEPARTMENT_OTHER)
Admission: EM | Admit: 2022-08-19 | Discharge: 2022-08-20 | Disposition: A | Discharge status: Home / Self Care | Payer: Commercial Managed Care - HMO | Attending: Emergency Medicine | Admitting: Emergency Medicine

## 2022-08-19 DIAGNOSIS — Z8507 Personal history of malignant neoplasm of pancreas: Secondary | ICD-10-CM | POA: Diagnosis not present

## 2022-08-19 DIAGNOSIS — R6883 Chills (without fever): Secondary | ICD-10-CM | POA: Insufficient documentation

## 2022-08-19 DIAGNOSIS — T451X5A Adverse effect of antineoplastic and immunosuppressive drugs, initial encounter: Secondary | ICD-10-CM

## 2022-08-19 DIAGNOSIS — R112 Nausea with vomiting, unspecified: Secondary | ICD-10-CM | POA: Diagnosis not present

## 2022-08-19 DIAGNOSIS — R109 Unspecified abdominal pain: Secondary | ICD-10-CM | POA: Diagnosis present

## 2022-08-19 DIAGNOSIS — Z86011 Personal history of benign neoplasm of the brain: Secondary | ICD-10-CM | POA: Insufficient documentation

## 2022-08-19 LAB — COMPREHENSIVE METABOLIC PANEL
ALT: 31 U/L (ref 0–44)
AST: 24 U/L (ref 15–41)
Albumin: 3.4 g/dL — ABNORMAL LOW (ref 3.5–5.0)
Alkaline Phosphatase: 51 U/L (ref 38–126)
Anion gap: 7 (ref 5–15)
BUN: 10 mg/dL (ref 6–20)
CO2: 30 mmol/L (ref 22–32)
Calcium: 8.8 mg/dL — ABNORMAL LOW (ref 8.9–10.3)
Chloride: 105 mmol/L (ref 98–111)
Creatinine, Ser: 0.61 mg/dL (ref 0.44–1.00)
GFR, Estimated: >60 mL/min (ref >60)
Glucose, Bld: 131 mg/dL — ABNORMAL HIGH (ref 70–99)
Potassium: 3.6 mmol/L (ref 3.5–5.1)
Sodium: 142 mmol/L (ref 135–145)
Total Bilirubin: 2.5 mg/dL — ABNORMAL HIGH (ref 0.3–1.2)
Total Protein: 6 g/dL — ABNORMAL LOW (ref 6.5–8.1)

## 2022-08-19 LAB — URINALYSIS, ROUTINE W REFLEX MICROSCOPIC
Bilirubin Urine: NEGATIVE
Glucose, UA: NEGATIVE mg/dL
Hgb urine dipstick: NEGATIVE
Ketones, ur: NEGATIVE mg/dL
Nitrite: NEGATIVE
Specific Gravity, Urine: 1.023 (ref 1.005–1.030)
pH: 5.5 (ref 5.0–8.0)

## 2022-08-19 LAB — CBC WITH DIFFERENTIAL/PLATELET
Abs Immature Granulocytes: 0.01 10*3/uL (ref 0.00–0.07)
Basophils Absolute: 0 10*3/uL (ref 0.0–0.1)
Basophils Relative: 0 %
Eosinophils Absolute: 0 10*3/uL (ref 0.0–0.5)
Eosinophils Relative: 0 %
HCT: 38.3 % (ref 36.0–46.0)
Hemoglobin: 12.5 g/dL (ref 12.0–15.0)
Immature Granulocytes: 0 %
Lymphocytes Relative: 9 %
Lymphs Abs: 0.7 10*3/uL (ref 0.7–4.0)
MCH: 28.2 pg (ref 26.0–34.0)
MCHC: 32.6 g/dL (ref 30.0–36.0)
MCV: 86.5 fL (ref 80.0–100.0)
Monocytes Absolute: 0.1 10*3/uL (ref 0.1–1.0)
Monocytes Relative: 1 %
Neutro Abs: 7.1 10*3/uL (ref 1.7–7.7)
Neutrophils Relative %: 90 %
Platelets: 119 10*3/uL — ABNORMAL LOW (ref 150–400)
RBC: 4.43 MIL/uL (ref 3.87–5.11)
RDW: 15.8 % — ABNORMAL HIGH (ref 11.5–15.5)
WBC: 7.9 10*3/uL (ref 4.0–10.5)
nRBC: 0 % (ref 0.0–0.2)

## 2022-08-19 LAB — LIPASE, BLOOD: Lipase: <10 U/L — ABNORMAL LOW (ref 11–51)

## 2022-08-19 MED ORDER — SODIUM CHLORIDE 0.9 % IV BOLUS
1000.0000 mL | Freq: Once | INTRAVENOUS | Status: AC
  Administered 2022-08-19: 1000 mL via INTRAVENOUS

## 2022-08-19 MED ORDER — METOCLOPRAMIDE HCL 5 MG/ML IJ SOLN
10.0000 mg | Freq: Once | INTRAMUSCULAR | Status: AC
  Administered 2022-08-19: 10 mg via INTRAVENOUS
  Filled 2022-08-19: qty 2

## 2022-08-19 MED ORDER — HYDROMORPHONE HCL 1 MG/ML IJ SOLN
1.0000 mg | Freq: Once | INTRAMUSCULAR | Status: AC
  Administered 2022-08-19: 1 mg via INTRAVENOUS
  Filled 2022-08-19: qty 1

## 2022-08-19 MED ORDER — ONDANSETRON HCL 4 MG/2ML IJ SOLN
4.0000 mg | Freq: Once | INTRAMUSCULAR | Status: AC
  Administered 2022-08-19: 4 mg via INTRAVENOUS
  Filled 2022-08-19: qty 2

## 2022-08-19 NOTE — ED Triage Notes (Signed)
Pt here from home with c/o abd pain along with some nausea no vomiting , pt is currently  being treated for Pancreatitic Ca

## 2022-08-19 NOTE — ED Provider Notes (Signed)
[facility] Provider Note   CSN: [id number] Arrival date & time: 08/19/22  1757     History  Chief Complaint  Patient presents with   Stone Pain    Kathleen Stone is a 56 y.o. female with Hx of pancreatic Stone, Kathleen Stone, Kathleen Stone, Kathleen Stone, Kathleen Stone, Kathleen Stone, Kathleen Stone pain with nausea Kathleen vomiting.  Patient states Friday she received her first round of chemotherapy.  On Saturday, noticed increase of duration of bowel movements from her normal chronic diarrhea.  Today around 8:00 in the morning, was woken up by significant Stone pain in the center of her stomach with nausea Kathleen some vomiting.  Pain is nonradiating, described as sharp Kathleen twisting.  Denies fever though endorses chills.  Tried one dose of oxycontin at home with no relief.  Denies changes in urinary habits, neck stiffness, lightheadedness, dizziness, or syncope.  The history is provided by the patient [facility] medical records.  Stone Pain     Home Medications Kathleen to Admission medications   Medication Sig Start Date End Date Taking? Authorizing Provider  Amoxicill-Rifabutin-Omeprazole (TALICIA) 546-56.8-12 MG CPDR Take 4 Capfuls by mouth in the morning, at noon, Kathleen at bedtime. 08/13/22   Mansouraty, Telford Nab., MD  amoxicillin (AMOXIL) 500 MG capsule Take 2 capsules (1,000 mg total) by mouth 2 (two) times daily for 10 days. 08/17/22 08/27/22  Mansouraty, Telford Nab., MD  atorvastatin (LIPITOR) 40 MG tablet Take 1 tablet (40 mg total) by mouth daily. Patient not taking: Reported on 07/31/2022 03/06/22   Wayland Denis, MD  Bismuth Subsalicylate 751 MG TABS Take 2 tablets (524 mg total) by mouth 4 (four) times daily for 10 days. 08/17/22 08/27/22  Mansouraty, Telford Nab., MD  calcium elemental as carbonate (TUMS ULTRA 1000) 400 MG chewable tablet Chew 1,000 mg by mouth 3 (three) times daily as needed  for heartburn. Patient not taking: Reported on 08/08/2022    [provider]  lidocaine-prilocaine (EMLA) cream Apply 1 Application topically as needed. 08/08/22   Ladell Pier, MD  metoprolol tartrate (LOPRESSOR) 25 MG tablet Take 1 tablet (25 mg total) by mouth 2 (two) times daily. Take 1 tablet 2 hours before your CT scan Patient taking differently: Take 25 mg by mouth 2 (two) times daily. 05/10/22   Serita Butcher, MD  metroNIDAZOLE (FLAGYL) 500 MG tablet Take 1 tablet (500 mg total) by mouth 2 (two) times daily for 10 days. 08/17/22 08/27/22  Mansouraty, Telford Nab., MD  nicotine (NICODERM CQ - DOSED IN MG/24 HOURS) 21 mg/24hr patch Place 1 patch (21 mg total) onto the skin daily. Patient not taking: Reported on 07/31/2022 03/13/22 03/13/23  Masters, Joellen Jersey, DO  nitroGLYCERIN (NITROSTAT) 0.4 MG SL tablet Place 1 tablet (0.4 mg total) under the tongue every 5 (five) minutes as needed for chest pain. Patient not taking: Reported on 07/31/2022 11/01/21 07/31/22  Atway, Jeananne Rama, DO  norethindrone (AYGESTIN) 5 MG tablet Take 5 mg by mouth daily. Patient not taking: Reported on 07/31/2022 05/11/22   [provider]  ondansetron (ZOFRAN) 4 MG tablet Take 1 tablet (4 mg total) by mouth every 8 (eight) hours as needed for nausea or vomiting. 06/19/22 06/19/23  Delene Ruffini, MD  Pancrelipase, Lip-Prot-Amyl, (PANCREAZE) 4200-14200 units CPEP Take 25,000 Units by mouth in the morning, at noon, Kathleen at bedtime. Patient taking differently: Take 16,800 units of lipase by mouth in the morning, at noon, Kathleen at bedtime. 06/19/22  Delene Ruffini, MD  pantoprazole (PROTONIX) 40 MG tablet Take 1 tablet (40 mg total) by mouth daily. 08/03/22 09/02/22  Terrilee Croak, MD  potassium chloride (KLOR-CON M) 10 MEQ tablet Take 2 tablets (20 mEq total) by mouth 2 (two) times daily. Take #2 tablets pm of 12/01, then #2 tablets twice daily 08/17/22   Ladell Pier, MD  predniSONE (DELTASONE) 10 MG  tablet Take 1 tablet (10 mg total) by mouth daily with breakfast. 08/08/22   Ladell Pier, MD  prochlorperazine (COMPAZINE) 10 MG tablet Take 1 tablet (10 mg total) by mouth every 6 (six) hours as needed for nausea or vomiting. 08/17/22   Ladell Pier, MD      Allergies    Iohexol    Review of Systems   Review of Systems  Gastrointestinal:  Positive for Stone pain.    Physical Exam Updated Vital Signs BP (!) 144/67   Pulse 87   Temp 98.4 F (36.9 C) (Oral)   Resp 18   LMP 02/07/2016 (Exact Date)   SpO2 94%  Physical Exam Vitals Kathleen nursing note reviewed.  Constitutional:      General: She is not in acute distress.    Appearance: She is well-developed. She is not ill-appearing, toxic-appearing or diaphoretic.  HENT:     Head: Normocephalic Kathleen atraumatic.  Eyes:     Conjunctiva/sclera: Conjunctivae normal.  Cardiovascular:     Rate Kathleen Rhythm: Normal rate Kathleen regular rhythm.     Heart sounds: No murmur heard. Pulmonary:     Effort: Pulmonary effort is normal. No respiratory distress.     Breath sounds: Normal breath sounds. No wheezing.  Chest:     Chest wall: No tenderness.  Stone:     General: Abdomen is protuberant. There is no distension.     Palpations: Abdomen is soft.     Tenderness: There is Stone tenderness in the epigastric area, periumbilical area Kathleen suprapubic area. There is no right Stone tenderness, left Stone tenderness or guarding. Negative signs include Murphy's sign Kathleen McBurney's sign.  Musculoskeletal:        General: No swelling.     Cervical back: Neck supple.  Skin:    General: Skin is warm Kathleen dry.     Capillary Refill: Capillary refill takes less than 2 seconds.  Neurological:     Mental Status: She is alert Kathleen oriented to person, place, Kathleen time.  Psychiatric:        Mood Kathleen Affect: Mood normal.     ED Results / Procedures / Treatments   Labs (all labs ordered are listed, but only abnormal results are displayed) Labs  Reviewed  CBC WITH DIFFERENTIAL/PLATELET - Abnormal; Notable for the following components:      Result Value   RDW 15.8 (*)    Platelets 119 (*)    All other components within normal limits  COMPREHENSIVE METABOLIC PANEL - Abnormal; Notable for the following components:   Glucose, Bld 131 (*)    Calcium 8.8 (*)    Total Protein 6.0 (*)    Albumin 3.4 (*)    Total Bilirubin 2.5 (*)    All other components within normal limits  LIPASE, BLOOD - Abnormal; Notable for the following components:   Lipase <10 (*)    All other components within normal limits  URINALYSIS, ROUTINE W REFLEX MICROSCOPIC    EKG None  Radiology CT ABDOMEN PELVIS WO CONTRAST  Result Date: 08/19/2022 CLINICAL DATA:  Acute Stone pain.  Known pancreatic malignancy. EXAM: CT ABDOMEN Kathleen PELVIS WITHOUT CONTRAST TECHNIQUE: Multidetector CT imaging of the abdomen Kathleen pelvis was performed following the standard protocol without IV contrast. RADIATION DOSE REDUCTION: This exam was performed according to the departmental dose-optimization program which includes automated exposure control, adjustment of the mA Kathleen/or kV according to patient size Kathleen/or use of iterative reconstruction technique. COMPARISON:  CT abdomen Kathleen pelvis 07/30/2022. MRI abdomen 07/30/2022. FINDINGS: Lower chest: No acute abnormality. Hepatobiliary: No focal there is hypodensity in the portal vein likely related to patient's known portal vein thrombosis. No definite biliary ductal dilatation. Pancreas: Ill-defined mass in the pancreatic body Kathleen tail appears grossly unchanged measuring approximately 3.0 x 5.1 cm. No fluid collection. Spleen: Normal in size without focal abnormality. Adrenals/Urinary Tract: There are punctate bilateral nonobstructing renal calculi. Bladder Kathleen adrenal glands are within normal limits. Stomach/Bowel: Stomach is within normal limits. Appendix appears normal. No evidence of bowel wall thickening, distention, or inflammatory  changes. The appendix is not visualized. There are scattered colonic diverticula. Vascular/Lymphatic: Aortic atherosclerosis. No enlarged Stone or pelvic lymph nodes. Reproductive: Uterus is lobulated enlarged compatible with fibroid change. The largest fibroid measures 4.3 cm. This is similar to Kathleen. The ovaries are nonenlarged. Other: There is trace free fluid in the pelvis. There is no focal Stone wall hernia. Musculoskeletal: No acute or significant osseous findings. IMPRESSION: 1. No acute localizing process in the abdomen or pelvis. 2. Stable ill-defined mass in the pancreatic body Kathleen tail compatible with patient's known pancreatic carcinoma. 3. Stable portal vein thrombosis. 4. Trace free fluid in the pelvis. 5. Fibroid uterus. 6. Nonobstructing bilateral renal calculi. 7. Colonic diverticulosis. Aortic Atherosclerosis (ICD10-I70.0). Electronically Signed   By: Ronney Asters M.D.   On: 08/19/2022 22:20    Procedures Procedures    Medications Ordered in ED Medications  sodium chloride 0.9 % bolus 1,000 mL (1,000 mLs Intravenous New Bag/Given 08/19/22 2127)  ondansetron (ZOFRAN) injection 4 mg (4 mg Intravenous Given 08/19/22 2127)  HYDROmorphone (DILAUDID) injection 1 mg (1 mg Intravenous Given 08/19/22 2127)  ondansetron (ZOFRAN) injection 4 mg (4 mg Intravenous Given 08/19/22 2204)  metoCLOPramide (REGLAN) injection 10 mg (10 mg Intravenous Given 08/19/22 2239)    ED Course/ Medical Decision Making/ A&P                           Medical Decision Making Amount Kathleen/or Complexity of Data Reviewed Labs: ordered. Radiology: ordered.  Risk Prescription drug management.   56 y.o. female presents to the ED for concern of Stone Pain     This involves an extensive number of treatment options, Kathleen is a complaint that carries with it a high risk of complications Kathleen morbidity.     Past Medical History / Co-morbidities / Social History: Hx of pancreatic Stone, Kathleen Stone,  Kathleen Stone, Kathleen Stone, Kathleen Stone, Kathleen Stone, Kathleen neuropathy Social Determinants of Health include: None  Additional History:  Obtained by chart review.  Notably Kathleen CT imaging Kathleen recent oncology visits, see for details.   Lab Tests: I ordered, Kathleen personally interpreted labs.  The pertinent results include:   Lipase < 10 WBC 7.9, no leukocytosis, no anemia No electrolyte derangement  Imaging Studies: I ordered imaging studies including CT abdomen/pelvis.   I independently visualized Kathleen interpreted imaging which showed  1. No acute localizing process in the abdomen or pelvis. 2. Stable ill-defined mass in the pancreatic body Kathleen tail compatible with patient's known pancreatic carcinoma. 3.  Stable portal vein thrombosis. 4. Trace free fluid in the pelvis. 5. Fibroid uterus. 6. Nonobstructing bilateral renal calculi. 7. Colonic diverticulosis. I agree with the radiologist interpretation.  Cardiac Monitoring: The patient was maintained on a cardiac monitor.  I personally viewed Kathleen interpreted the cardiac monitored which showed an underlying rhythm of: NSR  ED Course / Critical Interventions: Pt mildly ill-appearing on exam.  Known Hx of recently diagnosed pancreatic Stone.  Recently received first chemo treatment on Friday.  On Saturday, noticed increase of duration of bowel movements from her normal chronic diarrhea.  Today around 8:00 in the morning, was woken up by significant Stone pain in the center of her stomach with nausea Kathleen some vomiting.  Pain is nonradiating, described as sharp Kathleen twisting.  Vomiting described as watery or bilious, Kathleen nonbloody.  Without hematochezia or melena.  Afebrile.  Hemodynamically stable.  No AKI.  No leukocytosis.  Without significant electrolyte derangement.  Hx of Stone.  Stone pain central without radiation.  Plan to further assess with CT imaging.  Fluid bolus, nausea medication, Kathleen Dilaudid provided. CT imaging  negative for acute changes in abdomen Kathleen pelvis.  Labs Kathleen imaging not suggestive of pancreatitis, sepsis, pancreatitis, UTI, renal calculi, bowel obstruction, diverticulitis, bowel perforation. Upon reevaluation, patient still with significant nausea Kathleen vomiting though states Stone pain has significantly improved.  Reglan ordered.  Plan to reassess.  Disposition: 2300 care of YUMALAY CIRCLE transferred to Dr. Karle Starch at the end of my shift as the patient will require reassessment once labs/imaging have resulted.  Patient presentation, ED course, Kathleen plan of care discussed with review of all pertinent labs Kathleen imaging.  Please see his/her note for further details regarding further ED course Kathleen disposition.   Plan at time of handoff is dependent on patient's response to antiemetics.  If significant improvement, plan to manage from home Kathleen continue with oncology follow-up tomorrow as planned.  However if still with severe nausea vomiting, Kathleen/or returning Stone pain, may consider admission for intractable nausea Kathleen vomiting.  This may be altered or completely changed at the discretion of the oncoming team pending results of further workup.  I discussed this case with my attending, Dr. Maryan Rued, who agreed with the proposed treatment course Kathleen cosigned this note including patient's presenting symptoms, physical exam, Kathleen planned diagnostics Kathleen interventions.  Attending physician stated agreement with plan or made changes to plan which were implemented.     This chart was dictated using voice recognition software.  Despite best efforts to proofread, errors can occur which can change the documentation meaning.         Final Clinical Impression(s) / ED Diagnoses Final diagnoses:  None    Rx / DC Orders ED Discharge Orders     None         Candace Cruise [birth year] 2308    Blanchie Dessert, MD 08/28/22 [phone]

## 2022-08-20 ENCOUNTER — Other Ambulatory Visit: Payer: Self-pay | Admitting: Nurse Practitioner

## 2022-08-20 ENCOUNTER — Telehealth: Payer: Self-pay

## 2022-08-20 DIAGNOSIS — C251 Malignant neoplasm of body of pancreas: Secondary | ICD-10-CM

## 2022-08-20 MED ORDER — OXYCODONE HCL 5 MG PO TABS: 5.0000 mg | ORAL_TABLET | Freq: Four times a day (QID) | ORAL | 0 refills | Status: DC | PRN

## 2022-08-20 NOTE — ED Provider Notes (Signed)
Care of the patient assumed at the change of shift. Here for nausea, recently started chemo for pancreatic cancer. She is feeling better at re-evaluation and would like to go home. She has antiemetics at home. Recommend she follow up with Oncology. RTED for any worsening.    Truddie Hidden, MD 08/20/22 [phone]

## 2022-08-20 NOTE — Telephone Encounter (Signed)
Patient called and stating she is in pain. She went to the ED the night before for pain, nausea /vomiting. She is requesting something for a pain.

## 2022-08-20 NOTE — Telephone Encounter (Signed)
Called patient for first time chemo follow-up.  Voice message left for patient to contact office with any questions or concerns.  It was noted that patient was recently in ED for Nausea/vomiting.  Dr. Gearldine Shown RN and Nurse Navigator made aware and will continue to follow.

## 2022-08-20 NOTE — Telephone Encounter (Signed)
CSW attempted to contact patient to assess psychosocial needs.  Left vm. 

## 2022-08-21 ENCOUNTER — Inpatient Hospital Stay: Payer: Commercial Managed Care - HMO

## 2022-08-21 ENCOUNTER — Other Ambulatory Visit: Payer: Self-pay | Admitting: *Deleted

## 2022-08-21 ENCOUNTER — Telehealth: Payer: Self-pay | Admitting: Genetic Counselor

## 2022-08-21 ENCOUNTER — Encounter: Payer: Self-pay | Admitting: Nurse Practitioner

## 2022-08-21 ENCOUNTER — Inpatient Hospital Stay (HOSPITAL_BASED_OUTPATIENT_CLINIC_OR_DEPARTMENT_OTHER): Payer: Commercial Managed Care - HMO | Admitting: Nurse Practitioner

## 2022-08-21 ENCOUNTER — Inpatient Hospital Stay: Payer: Commercial Managed Care - HMO | Admitting: Licensed Clinical Social Worker

## 2022-08-21 VITALS — BP 149/68 | HR 89 | Temp 97.4°F | Resp 18

## 2022-08-21 DIAGNOSIS — C251 Malignant neoplasm of body of pancreas: Secondary | ICD-10-CM

## 2022-08-21 DIAGNOSIS — Z5111 Encounter for antineoplastic chemotherapy: Secondary | ICD-10-CM | POA: Diagnosis not present

## 2022-08-21 LAB — CBC WITH DIFFERENTIAL (CANCER CENTER ONLY)
Abs Immature Granulocytes: 0.03 10*3/uL (ref 0.00–0.07)
Basophils Absolute: 0 10*3/uL (ref 0.0–0.1)
Basophils Relative: 0 %
Eosinophils Absolute: 0.1 10*3/uL (ref 0.0–0.5)
Eosinophils Relative: 1 %
HCT: 36.5 % (ref 36.0–46.0)
Hemoglobin: 12.1 g/dL (ref 12.0–15.0)
Immature Granulocytes: 0 %
Lymphocytes Relative: 15 %
Lymphs Abs: 1.3 10*3/uL (ref 0.7–4.0)
MCH: 28.5 pg (ref 26.0–34.0)
MCHC: 33.2 g/dL (ref 30.0–36.0)
MCV: 85.9 fL (ref 80.0–100.0)
Monocytes Absolute: 0 10*3/uL — ABNORMAL LOW (ref 0.1–1.0)
Monocytes Relative: 0 %
Neutro Abs: 7.5 10*3/uL (ref 1.7–7.7)
Neutrophils Relative %: 84 %
Platelet Count: 121 10*3/uL — ABNORMAL LOW (ref 150–400)
RBC: 4.25 MIL/uL (ref 3.87–5.11)
RDW: 15.8 % — ABNORMAL HIGH (ref 11.5–15.5)
WBC Count: 8.9 10*3/uL (ref 4.0–10.5)
nRBC: 0 % (ref 0.0–0.2)

## 2022-08-21 LAB — URINALYSIS, COMPLETE (UACMP) WITH MICROSCOPIC
Bilirubin Urine: NEGATIVE
Glucose, UA: NEGATIVE mg/dL
Hgb urine dipstick: NEGATIVE
Ketones, ur: NEGATIVE mg/dL
Leukocytes,Ua: NEGATIVE
Nitrite: NEGATIVE
Specific Gravity, Urine: 1.018 (ref 1.005–1.030)
pH: 5.5 (ref 5.0–8.0)

## 2022-08-21 LAB — HEPATIC FUNCTION PANEL
ALT: 72 U/L — ABNORMAL HIGH (ref 0–44)
AST: 73 U/L — ABNORMAL HIGH (ref 15–41)
Albumin: 3.4 g/dL — ABNORMAL LOW (ref 3.5–5.0)
Alkaline Phosphatase: 50 U/L (ref 38–126)
Bilirubin, Direct: 0.6 mg/dL — ABNORMAL HIGH (ref 0.0–0.2)
Indirect Bilirubin: 1.6 mg/dL — ABNORMAL HIGH (ref 0.3–0.9)
Total Bilirubin: 2.2 mg/dL — ABNORMAL HIGH (ref 0.3–1.2)
Total Protein: 6 g/dL — ABNORMAL LOW (ref 6.5–8.1)

## 2022-08-21 LAB — BASIC METABOLIC PANEL - CANCER CENTER ONLY
Anion gap: 9 (ref 5–15)
BUN: 7 mg/dL (ref 6–20)
CO2: 30 mmol/L (ref 22–32)
Calcium: 9 mg/dL (ref 8.9–10.3)
Chloride: 105 mmol/L (ref 98–111)
Creatinine: 0.54 mg/dL (ref 0.44–1.00)
GFR, Estimated: >60 mL/min (ref >60)
Glucose, Bld: 108 mg/dL — ABNORMAL HIGH (ref 70–99)
Potassium: 3 mmol/L — ABNORMAL LOW (ref 3.5–5.1)
Sodium: 144 mmol/L (ref 135–145)

## 2022-08-21 MED ORDER — SODIUM CHLORIDE 0.9 % IV SOLN: Freq: Once | INTRAVENOUS | Status: DC

## 2022-08-21 MED ORDER — POTASSIUM CHLORIDE 10 MEQ/100ML IV SOLN
10.0000 meq | INTRAVENOUS | Status: AC
  Administered 2022-08-21 (×2): 10 meq via INTRAVENOUS
  Filled 2022-08-21: qty 100

## 2022-08-21 MED ORDER — SODIUM CHLORIDE 0.9 % IV SOLN: Freq: Once | INTRAVENOUS | Status: AC

## 2022-08-21 MED ORDER — SODIUM CHLORIDE 0.9% FLUSH
10.0000 mL | Freq: Once | INTRAVENOUS | Status: DC
  Administered 2022-08-21: 10 mL via INTRAVENOUS

## 2022-08-21 MED ORDER — HEPARIN SOD (PORK) LOCK FLUSH 100 UNIT/ML IV SOLN
500.0000 [IU] | Freq: Once | INTRAVENOUS | Status: DC
  Administered 2022-08-21: 500 [IU] via INTRAVENOUS

## 2022-08-21 NOTE — Patient Instructions (Addendum)
Hold Amoxicillin and Flagyl  Rehydration, Adult Rehydration is the replacement of fluids, salts, and minerals in the body (electrolytes) that are lost during dehydration. Dehydration is when there is not enough water or other fluids in the body. This happens when you lose more fluids than you take in. Common causes of dehydration include: Not drinking enough fluids. This can occur when you are ill or doing activities that require a lot of energy, especially in hot weather. Conditions that cause loss of water or other fluids. These include diarrhea, vomiting, sweating, and urinating a lot. Other illnesses, such as fever or infection. Certain medicines, such as those that remove excess fluid from the body (diuretics). Symptoms of mild or moderate dehydration may include thirst, dry lips and mouth, and dizziness. Symptoms of severe dehydration may include increased heart rate, confusion, fainting, and not urinating. In severe cases, you may need to get fluids through an IV at the hospital. For mild or moderate cases, you can usually rehydrate at home by drinking certain fluids as told by your health care provider. What are the risks? Your health care provider will talk with you about risks. Your health care provider will talk with you about risks. This may include taking in too much fluid (overhydration). This is rare. Overhydration can cause an imbalance of electrolytes in the body, kidney failure, or a decrease in salt (sodium) levels in the body. Supplies needed: You will need an oral rehydration solution (ORS) if your health care provider tells you to use one. This is a drink to treat dehydration. It can be found in pharmacies and retail stores. How to rehydrate Fluids Follow instructions from your health care provider about what to drink. The kind of fluid and the amount you should drink depend on your condition. In general, you should choose drinks that you prefer. If told by your health care  provider, drink an ORS. Make an ORS by following instructions on the package. Start by drinking small amounts, about  cup (120 mL) every 5-10 minutes. Slowly increase how much you drink until you have taken in the amount recommended by your health care provider. Drink enough clear fluids to keep your urine pale yellow. If you were told to drink an ORS, finish it first, then start slowly drinking other clear fluids. Drink fluids such as: Water. This includes sparkling and flavored water. Drinking only water can lead to having too little sodium in your body (hyponatremia). Follow the advice of your health care provider. Water from ice chips you suck on. Fruit juice with water added to it (diluted). Sports drinks. Hot or cold herbal teas. Broth-based soups. Milk or milk products. Food Follow instructions from your health care provider about what to eat while you rehydrate. Your health care provider may recommend that you slowly begin eating regular foods in small amounts. Eat foods that contain a healthy balance of electrolytes, such as bananas, oranges, potatoes, tomatoes, and spinach. Avoid foods that are greasy or contain a lot of sugar. In some cases, you may get nutrition through a feeding tube that is passed through your nose and into your stomach (nasogastric tube, or NG tube). This may be done if you have uncontrolled vomiting or diarrhea. Drinks to avoid  Certain drinks may make dehydration worse. While you rehydrate, avoid drinking alcohol. How to tell if you are recovering from dehydration You may be getting better if: You are urinating more often than before you started rehydrating. Your urine is pale yellow. Your energy  level improves. You vomit less often. You have diarrhea less often. Your appetite improves or returns to normal. You feel less dizzy or light-headed. Your skin tone and color start to look more normal. Follow these instructions at home: Take over-the-counter  and prescription medicines only as told by your health care provider. Do not take sodium tablets. Doing this can lead to having too much sodium in your body (hypernatremia). Contact a health care provider if: You continue to have symptoms of mild or moderate dehydration, such as: Thirst. Dry lips. Slightly dry mouth. Dizziness. Dark urine or less urine than normal. Muscle cramps. You continue to vomit or have diarrhea. Get help right away if: You have symptoms of dehydration that get worse. You have a fever. You have a severe headache. You have been vomiting and have problems, such as: Your vomiting gets worse or does not go away. Your vomit includes blood or green matter (bile). You cannot eat or drink without vomiting. You have problems with urination or bowel movements, such as: Diarrhea that gets worse or does not go away. Blood in your stool (feces). This may cause stool to look black and tarry. Not urinating, or urinating only a small amount of very dark urine, within 6-8 hours. You have trouble breathing. You have symptoms that get worse with treatment. These symptoms may be an emergency. Get help right away. Call 911. Do not wait to see if the symptoms will go away. Do not drive yourself to the hospital. This information is not intended to replace advice given to you by your health care provider. Make sure you discuss any questions you have with your health care provider. Document Revised: 01/15/2022 Document Reviewed: 01/15/2022 Elsevier Patient Education  [place].  Hypokalemia Hypokalemia means that the amount of potassium in the blood is lower than normal. Potassium is a mineral (electrolyte) that helps regulate the amount of fluid in the body. It also stimulates muscle tightening (contraction) and helps nerves work properly. Normally, most of the body's potassium is inside cells, and only a very small amount is in the blood. Because the amount in the blood is  so small, minor changes to potassium levels in the blood can be life-threatening. What are the causes? This condition may be caused by: Antibiotic medicine. Diarrhea or vomiting. Taking too much of a medicine that helps you have a bowel movement (laxative) can cause diarrhea and lead to hypokalemia. Chronic kidney disease (CKD). Medicines that help the body get rid of excess fluid (diuretics). Eating disorders, such as anorexia or bulimia. Low magnesium levels in the body. Sweating a lot. What are the signs or symptoms? Symptoms of this condition include: Weakness. Constipation. Fatigue. Muscle cramps. Mental confusion. Skipped heartbeats or irregular heartbeat (palpitations). Tingling or numbness. How is this diagnosed? This condition is diagnosed with a blood test. How is this treated? This condition may be treated by: Taking potassium supplements. Adjusting the medicines that you take. Eating more foods that contain a lot of potassium. If your potassium level is very low, you may need to get potassium through an IV and be monitored in the hospital. Follow these instructions at home: Eating and drinking  Eat a healthy diet. A healthy diet includes fresh fruits and vegetables, whole grains, healthy fats, and lean proteins. If told, eat more foods that contain a lot of potassium. These include: Nuts, such as peanuts and pistachios. Seeds, such as sunflower seeds and pumpkin seeds. Peas, lentils, and lima beans. Whole grain and  bran cereals and breads. Fresh fruits and vegetables, such as apricots, avocado, bananas, cantaloupe, kiwi, oranges, tomatoes, asparagus, and potatoes. Juices, such as orange, tomato, and prune. Lean meats, including fish. Milk and milk products, such as yogurt. General instructions Take over-the-counter and prescription medicines only as told by your health care provider. This includes vitamins, natural food products, and supplements. Keep all  follow-up visits. This is important. Contact a health care provider if: You have weakness that gets worse. You feel your heart pounding or racing. You vomit. You have diarrhea. You have diabetes and you have trouble keeping your blood sugar in your target range. Get help right away if: You have chest pain. You have shortness of breath. You have vomiting or diarrhea that lasts for more than 2 days. You faint. These symptoms may be an emergency. Get help right away. Call 911. Do not wait to see if the symptoms will go away. Do not drive yourself to the hospital. Summary Hypokalemia means that the amount of potassium in the blood is lower than normal. This condition is diagnosed with a blood test. Hypokalemia may be treated by taking potassium supplements, adjusting the medicines that you take, or eating more foods that are high in potassium. If your potassium level is very low, you may need to get potassium through an IV and be monitored in the hospital. This information is not intended to replace advice given to you by your health care provider. Make sure you discuss any questions you have with your health care provider. Document Revised: 05/18/2021 Document Reviewed: 05/18/2021 Elsevier Patient Education  [place].

## 2022-08-21 NOTE — Progress Notes (Signed)
Patient here for port flush with labs. Pt complaining of abdominal pain of 9.  Patient was in the ED over the weekend for N/V.  Pt reports N/V improved but hasn't had a bowel movement since Saturday.  Berneta Sages, RN at chairside, reported to Leander Rams, NP.

## 2022-08-21 NOTE — Telephone Encounter (Signed)
Called patient to notify of upcoming appointment. Patient notified and mailing reminder

## 2022-08-21 NOTE — Progress Notes (Signed)
Patient with nausea and pain, discussed with Dr Benay Spice, orders placed for IV Normal Saline and labs today

## 2022-08-21 NOTE — Progress Notes (Signed)
[place] OFFICE PROGRESS NOTE   Diagnosis: Pancreas cancer  INTERVAL HISTORY:   Kathleen Stone is seen prior to scheduled follow-up for evaluation of abdominal pain.  She completed cycle 1 gemcitabine/Abraxane 08/17/2022.  She began experiencing loose stools and "crampy" lower abdominal pain 08/19/2022.  She denies nausea/vomiting.  No fever.  No urinary symptoms.  She was seen in the emergency department on 08/19/2022.  Labs-bilirubin elevated at 2.5, lipase less than 10, urine with increased white cells and bacteria.  CT abdomen/pelvis with stable ill-defined mass in the pancreatic body and tail, stable portal vein thrombosis, trace free fluid in the pelvis, no definite biliary duct dilatation.  Objective:  Vital signs in last 24 hours: Temperature 97.4, heart rate 89, respirations 18, blood pressure 149/68, oxygen saturation 100%  Resp: Lungs clear bilaterally. Cardio: Regular rate and rhythm. GI: Abdomen is soft, tender at low abdomen.  No hepatomegaly. Vascular: No leg edema. Port-A-Cath without erythema.  Lab Results:  Lab Results  Component Value Date   WBC 8.9 08/21/2022   HGB 12.1 08/21/2022   HCT 36.5 08/21/2022   MCV 85.9 08/21/2022   PLT 121 (L) 08/21/2022   NEUTROABS 7.5 08/21/2022    Imaging:  CT ABDOMEN PELVIS WO CONTRAST  Result Date: 08/19/2022 CLINICAL DATA:  Acute abdominal pain.  Known pancreatic malignancy. EXAM: CT ABDOMEN AND PELVIS WITHOUT CONTRAST TECHNIQUE: Multidetector CT imaging of the abdomen and pelvis was performed following the standard protocol without IV contrast. RADIATION DOSE REDUCTION: This exam was performed according to the departmental dose-optimization program which includes automated exposure control, adjustment of the mA and/or kV according to patient size and/or use of iterative reconstruction technique. COMPARISON:  CT abdomen and pelvis 07/30/2022. MRI abdomen 07/30/2022. FINDINGS: Lower chest: No acute abnormality.  Hepatobiliary: No focal there is hypodensity in the portal vein likely related to patient's known portal vein thrombosis. No definite biliary ductal dilatation. Pancreas: Ill-defined mass in the pancreatic body and tail appears grossly unchanged measuring approximately 3.0 x 5.1 cm. No fluid collection. Spleen: Normal in size without focal abnormality. Adrenals/Urinary Tract: There are punctate bilateral nonobstructing renal calculi. Bladder and adrenal glands are within normal limits. Stomach/Bowel: Stomach is within normal limits. Appendix appears normal. No evidence of bowel wall thickening, distention, or inflammatory changes. The appendix is not visualized. There are scattered colonic diverticula. Vascular/Lymphatic: Aortic atherosclerosis. No enlarged abdominal or pelvic lymph nodes. Reproductive: Uterus is lobulated enlarged compatible with fibroid change. The largest fibroid measures 4.3 cm. This is similar to prior. The ovaries are nonenlarged. Other: There is trace free fluid in the pelvis. There is no focal abdominal wall hernia. Musculoskeletal: No acute or significant osseous findings. IMPRESSION: 1. No acute localizing process in the abdomen or pelvis. 2. Stable ill-defined mass in the pancreatic body and tail compatible with patient's known pancreatic carcinoma. 3. Stable portal vein thrombosis. 4. Trace free fluid in the pelvis. 5. Fibroid uterus. 6. Nonobstructing bilateral renal calculi. 7. Colonic diverticulosis. Aortic Atherosclerosis (ICD10-I70.0). Electronically Signed   By: Kathleen Stone M.D.   On: 08/19/2022 22:20    Medications: I have reviewed the patient's current medications.  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 CT abdomen/pelvis 07/30/2022-body/neck pancreas mass with peripheral pancreatic atrophy and duct dilation, suspected encasement of the celiac axis, uterine fibroids MRI abdomen 07/30/2022-3.7 x 4.7 cm pancreas body/tail mass with encasement of the proximal celiac artery,  occlusion with collateralization at the confluence of the SMV and portal vein, no evidence of metastatic disease CT chest 08/01/2022-no intrathoracic  metastases Elevated CA 19-9 EUS 08/02/2022-gastric polyp, gastric erosions, 32 x 33 mm pancreas body mass, invasion of the celiac trunk, invasion of the splenoportal confluence, no SMA invasion, 2 malignant appearing nodes in the porta hepatis,T4N1 by endoscopic criteria, FNA biopsy-adenocarcinoma   2.  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  Anorexia/weight loss secondary to #1 4.  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  Abdominal pain 6.  History of colon polyps 7.  Family history of multiple cancers Pancreas cancer maternal aunt, maternal cousin, maternal grandfather Breast cancer-maternal aunt, maternal cousin Prostate cancer-maternal uncle Lung cancer-father 7.  Hypertension 9.  Postmenopausal vaginal bleeding 10.  H. pylori 08/02/2022    Disposition: [name] appears stable.  She completed cycle 1 gemcitabine/Abraxane 08/17/2022.  She presents today with increased loose stools and crampy abdominal pain.  LFTs mildly elevated.  Potassium is low.  She will submit a stool sample for C. difficile testing.  She began amoxicillin and Flagyl as part of the treatment regimen for H. pylori on 08/17/2022.  She feels symptom onset corresponds to these medications.  She will hold both for now.    She received a liter of IV fluids with 20 meq of potassium.  She felt better following the IV fluids and appears stable for discharge home.  She is scheduled to return for follow-up next week.  She will contact the office in the next few days with an update on her condition.   Ned Card ANP/GNP-BC   08/21/2022  10:51 AM

## 2022-08-21 NOTE — Progress Notes (Signed)
[place] Work  Initial Assessment   Kathleen Stone is a 56 y.o. year old female presenting alone. Clinical Social Work was referred by nurse navigator for assessment of psychosocial needs.   SDOH (Social Determinants of Health) assessments performed: Yes SDOH Interventions    Flowsheet Row ED to Hosp-Admission (Discharged) from 07/30/2022 in [place] Coordination from 06/29/2022 in [place] Coordination Office Visit from 04/05/2022 in [place] Office Visit from 02/21/2022 in [place] Interventions -- Intervention Not Indicated -- --  Housing Interventions Intervention Not Indicated -- -- --  Transportation Interventions -- Intervention Not Indicated -- --  Depression Interventions/Treatment  -- -- --  [patient has not taken her meds] Medication, Counseling       SDOH Screenings   Food Insecurity: No Food Insecurity (07/31/2022)  Housing: Low Risk  (07/31/2022)  Transportation Needs: No Transportation Needs (07/31/2022)  Utilities: Not At Risk (07/31/2022)  Depression (PHQ2-9): Medium Risk (06/19/2022)  Tobacco Use: High Risk (08/08/2022)     Distress Screen completed: No     No data to display            Family/Social Information:  Housing Arrangement: patient lives with her sister. Family members/support persons in your life? Family and Friends Transportation concerns: no  Employment: Unemployed  Income source: Supported by [employer] and [employer] Financial concerns: Yes, due to illness and/or loss of work during treatment Type of concern: Medical bills Food access concerns: no Religious or spiritual practice: Yes Services Currently in place:  [employer]  Coping/ Adjustment to diagnosis: Patient understands treatment plan and what happens next? 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 Patient enjoys time with family/ friends Current coping skills/ strengths: Capable of independent living , Armed forces logistics/support/administrative officer , General fund of knowledge , Motivation for treatment/growth , and Supportive family/friends     SUMMARY: Current SDOH Barriers:  Financial constraints related to not working due to illness.  Clinical Social Work Clinical Goal(s):  Explore community resource options for unmet needs related to:  Financial Strain   Interventions: Discussed common feeling and emotions when being diagnosed with cancer, and the importance of support during treatment Informed patient of the support team roles and support services at [facility] Provided [place] contact information and encouraged patient to call with any questions or concerns Provided patient with information about the [place] made referral.  Patient wishes to apply for disability.  She signed the release of information form for the [facility] and CSW will begin application process.   Follow Up Plan: Patient will contact CSW with any support or resource needs Patient verbalizes understanding of plan: Yes    Rodman Pickle Amita Atayde, LCSW

## 2022-08-22 ENCOUNTER — Other Ambulatory Visit: Payer: Self-pay

## 2022-08-22 LAB — URINE CULTURE: Culture: NO GROWTH

## 2022-08-23 ENCOUNTER — Telehealth: Payer: Self-pay

## 2022-08-23 NOTE — Telephone Encounter (Signed)
-----   Message from Owens Shark, NP sent at 08/23/2022 12:40 PM EST ----- How is the abdominal pain? ----- Message ----- From: Velna Hatchet, LPN Sent: 33/12/3566  12:23 PM EST To: Owens Shark, NP  She had diarrhea yesterday twice. Her sister buy her something with potassium. She is not really eating and no energy.  ----- Message ----- From: Owens Shark, NP Sent: 08/22/2022   7:41 AM EST To: [facility]  Please call and check on her today, make sure she is taking potassium

## 2022-08-23 NOTE — Telephone Encounter (Signed)
She is overall feeling a little better just does not have any energy

## 2022-08-24 ENCOUNTER — Other Ambulatory Visit: Payer: Self-pay

## 2022-08-26 ENCOUNTER — Other Ambulatory Visit: Payer: Self-pay | Admitting: Oncology

## 2022-08-26 DIAGNOSIS — C251 Malignant neoplasm of body of pancreas: Secondary | ICD-10-CM

## 2022-08-27 ENCOUNTER — Encounter: Payer: Self-pay | Admitting: Genetic Counselor

## 2022-08-27 DIAGNOSIS — Z1379 Encounter for other screening for genetic and chromosomal anomalies: Secondary | ICD-10-CM | POA: Insufficient documentation

## 2022-08-28 LAB — GENETIC SCREENING ORDER

## 2022-08-28 LAB — GUARDANT 360

## 2022-08-29 ENCOUNTER — Inpatient Hospital Stay: Payer: Commercial Managed Care - HMO | Admitting: Nutrition

## 2022-08-29 NOTE — Progress Notes (Signed)
56 year old female diagnosed with Pancreas Cancer and followed by Dr. Benay Spice.  She receives Gemcitabine and Abraxane.  PMH includes Chronic back pain, HLD, HTN, Stroke, Tobacco.  Medications include Flagyl, Zofran, Pancrease, Protonix, Compazine, and Klor-con.  Labs include K 3.0 and Glucose 108 on Dec 5.  Height: 66 inches Weight: 170 pounds on November 22. UBW: 238 pounds per patient 5 months ago BMI: 27.45.  Contacted patient by telephone. She reports she doesn't feel well today and did not want to talk long. She states she has a cold. Patient has had anorexia, wt loss, diarrhea and decreased energy. Reports diarrhea has improved and is only once daily. She stopped taking her enzymes. States she only takes 3 different medicines now because they make her sick. Reports she will discuss with the doctor at Friday's appointment. States she doesn't have nausea because she takes a nausea pill every morning. She thinks she can eat better once her cold improves.  Nutrition Diagnosis: Unintended wt loss related to pancreas cancer and associated treatments as evidenced by 29% wt loss from states usual body weight.  Intervention: Educated to consume small frequent meals and snacks. Try to increase protein and fluids. Monitor stools and consider resuming pancrease if diarrhea worsens.  Monitoring, Evaluation, Goals: Patient will tolerate adequate calories and protein to minimize weight loss  Next Visit: To be scheduled as needed.

## 2022-08-30 ENCOUNTER — Ambulatory Visit: Payer: Self-pay

## 2022-08-30 NOTE — Patient Instructions (Signed)
Visit Information  Thank you for taking time to visit with me today. Please don't hesitate to contact me if I can be of assistance to you.   Following are the goals we discussed today:   Goals Addressed             This Visit's Progress    I don't understand why I am losing weight.       Care Coordination Interventions:   Evaluation of current treatment plan and patient's adherence to plan as established by provider Reviewed medications with patient and discussed  Active listening / Reflection utilized  Emotional Support Provided Problem [place] strategies reviewed Verbalization of feelings encouraged  Get help from Social work at [employer] with paper work         [place] next appointment is by telephone on 10/04/22 at 10 am  Please call the care guide team at [phone] if you need to cancel or reschedule your appointment.   If you are experiencing a Mental Health or [place] or need someone to talk to, please call 1-800-273-TALK (toll free, 24 hour hotline)  Patient verbalizes understanding of instructions and care plan provided today and agrees to view in [place]. Active MyChart status and patient understanding of how to access instructions and care plan via MyChart confirmed with patient.     Lazaro Arms RN, BSN, [facility]   Phone: [phone]

## 2022-08-30 NOTE — Patient Outreach (Signed)
  Care Coordination   Follow Up Visit Note   08/30/2022 Name: Kathleen Stone MRN: [id number] DOB: 1966-07-12  Kathleen Stone is a 56 y.o. year old female who sees Masters, Kathleen Jersey, DO for primary care. I spoke with  Kathleen Stone by phone today.  What matters to the patients health and wellness today?  Kathleen Stone is currently struggling with a cold, but she is managing it well. She mentioned that she is beginning to feel better, although she still experiences diarrhea after eating. Fortunately, her nausea and vomiting have reduced after taking medication. She has chemotherapy appointments every Friday. Unfortunately, she was denied Disability food stamps and Medicaid. However, her sister is providing her with support. I talked to her sister and provided some helpful information. I also advised her to ask the social worker at [place] for assistance in completing the necessary paperwork.    Goals Addressed             This Visit's Progress    I don't understand why I am losing weight.       Care Coordination Interventions:   Evaluation of current treatment plan and patient's adherence to plan as established by provider Reviewed medications with patient and discussed  Active listening / Reflection utilized  Emotional Support Provided Problem [place] strategies reviewed Verbalization of feelings encouraged  Get help from Social work at [employer] with paper work         SDOH assessments and interventions completed:  No     Care Coordination Interventions:  Yes, provided   Follow up plan: Follow up call scheduled for 10/04/22 10 am    Encounter Outcome:  Pt. Visit Completed   Kathleen Arms RN, BSN, [place] [facility]   Phone: [phone]

## 2022-08-31 ENCOUNTER — Inpatient Hospital Stay: Payer: Commercial Managed Care - HMO

## 2022-08-31 ENCOUNTER — Telehealth: Payer: Self-pay

## 2022-08-31 ENCOUNTER — Inpatient Hospital Stay (HOSPITAL_BASED_OUTPATIENT_CLINIC_OR_DEPARTMENT_OTHER): Payer: Commercial Managed Care - HMO | Admitting: Nurse Practitioner

## 2022-08-31 ENCOUNTER — Encounter: Payer: Self-pay | Admitting: Nurse Practitioner

## 2022-08-31 VITALS — BP 142/78 | HR 67 | Resp 18

## 2022-08-31 VITALS — BP 150/84 | HR 99 | Temp 97.1°F | Resp 16 | Wt 161.6 lb

## 2022-08-31 DIAGNOSIS — C251 Malignant neoplasm of body of pancreas: Secondary | ICD-10-CM

## 2022-08-31 DIAGNOSIS — Z5111 Encounter for antineoplastic chemotherapy: Secondary | ICD-10-CM | POA: Diagnosis not present

## 2022-08-31 LAB — CMP (CANCER CENTER ONLY)
ALT: 48 U/L — ABNORMAL HIGH (ref 0–44)
AST: 38 U/L (ref 15–41)
Albumin: 3.9 g/dL (ref 3.5–5.0)
Alkaline Phosphatase: 55 U/L (ref 38–126)
Anion gap: 14 (ref 5–15)
BUN: 12 mg/dL (ref 6–20)
CO2: 27 mmol/L (ref 22–32)
Calcium: 9.8 mg/dL (ref 8.9–10.3)
Chloride: 102 mmol/L (ref 98–111)
Creatinine: 0.8 mg/dL (ref 0.44–1.00)
GFR, Estimated: >60 mL/min (ref >60)
Glucose, Bld: 126 mg/dL — ABNORMAL HIGH (ref 70–99)
Potassium: 2.7 mmol/L — CL (ref 3.5–5.1)
Sodium: 143 mmol/L (ref 135–145)
Total Bilirubin: 0.9 mg/dL (ref 0.3–1.2)
Total Protein: 7.4 g/dL (ref 6.5–8.1)

## 2022-08-31 LAB — CBC WITH DIFFERENTIAL (CANCER CENTER ONLY)
Abs Immature Granulocytes: 0.29 10*3/uL — ABNORMAL HIGH (ref 0.00–0.07)
Basophils Absolute: 0 10*3/uL (ref 0.0–0.1)
Basophils Relative: 0 %
Eosinophils Absolute: 0 10*3/uL (ref 0.0–0.5)
Eosinophils Relative: 0 %
HCT: 42.6 % (ref 36.0–46.0)
Hemoglobin: 14.1 g/dL (ref 12.0–15.0)
Immature Granulocytes: 3 %
Lymphocytes Relative: 21 %
Lymphs Abs: 2.4 10*3/uL (ref 0.7–4.0)
MCH: 28.7 pg (ref 26.0–34.0)
MCHC: 33.1 g/dL (ref 30.0–36.0)
MCV: 86.8 fL (ref 80.0–100.0)
Monocytes Absolute: 0.6 10*3/uL (ref 0.1–1.0)
Monocytes Relative: 5 %
Neutro Abs: 8.4 10*3/uL — ABNORMAL HIGH (ref 1.7–7.7)
Neutrophils Relative %: 71 %
Platelet Count: 344 10*3/uL (ref 150–400)
RBC: 4.91 MIL/uL (ref 3.87–5.11)
RDW: 16.6 % — ABNORMAL HIGH (ref 11.5–15.5)
WBC Count: 11.7 10*3/uL — ABNORMAL HIGH (ref 4.0–10.5)
nRBC: 0 % (ref 0.0–0.2)

## 2022-08-31 LAB — C DIFFICILE QUICK SCREEN W PCR REFLEX
C Diff antigen: NEGATIVE
C Diff interpretation: NOT DETECTED
C Diff toxin: NEGATIVE

## 2022-08-31 LAB — MAGNESIUM: Magnesium: 1.7 mg/dL (ref 1.7–2.4)

## 2022-08-31 MED ORDER — POTASSIUM CHLORIDE CRYS ER 20 MEQ PO TBCR
20.0000 meq | EXTENDED_RELEASE_TABLET | Freq: Once | ORAL | Status: AC
  Administered 2022-08-31: 20 meq via ORAL
  Filled 2022-08-31: qty 1

## 2022-08-31 MED ORDER — POTASSIUM CHLORIDE 10 MEQ/100ML IV SOLN
10.0000 meq | INTRAVENOUS | Status: AC
  Administered 2022-08-31 (×2): 10 meq via INTRAVENOUS
  Filled 2022-08-31: qty 100

## 2022-08-31 MED ORDER — SODIUM CHLORIDE 0.9 % IV SOLN
1000.0000 mg/m2 | Freq: Once | INTRAVENOUS | Status: AC
  Administered 2022-08-31: 1900 mg via INTRAVENOUS
  Filled 2022-08-31: qty 49.97

## 2022-08-31 MED ORDER — PACLITAXEL PROTEIN-BOUND CHEMO INJECTION 100 MG
100.0000 mg/m2 | Freq: Once | INTRAVENOUS | Status: AC
  Administered 2022-08-31: 200 mg via INTRAVENOUS
  Filled 2022-08-31: qty 40

## 2022-08-31 MED ORDER — PROCHLORPERAZINE MALEATE 10 MG PO TABS
10.0000 mg | ORAL_TABLET | Freq: Once | ORAL | Status: AC
  Administered 2022-08-31: 10 mg via ORAL
  Filled 2022-08-31: qty 1

## 2022-08-31 MED ORDER — SODIUM CHLORIDE 0.9% FLUSH
10.0000 mL | INTRAVENOUS | Status: DC | PRN
  Administered 2022-08-31: 10 mL

## 2022-08-31 MED ORDER — HEPARIN SOD (PORK) LOCK FLUSH 100 UNIT/ML IV SOLN
500.0000 [IU] | Freq: Once | INTRAVENOUS | Status: AC | PRN
  Administered 2022-08-31: 500 [IU]

## 2022-08-31 MED ORDER — SODIUM CHLORIDE 0.9 % IV SOLN: Freq: Once | INTRAVENOUS | Status: AC

## 2022-08-31 NOTE — Progress Notes (Addendum)
[place] OFFICE PROGRESS NOTE   Diagnosis: Pancreas cancer  INTERVAL HISTORY:   Ms. Fangman returns as scheduled.  She completed cycle 1 gemcitabine/Abraxane on 08/17/2022.  She was seen in an unscheduled visit on 08/21/2022 with loose stools and crampy abdominal pain.  She had recently started amoxicillin and Flagyl for treatment of H. pylori.  Both were placed on hold.  She received IV fluids.  She is feeling much better.  Symptoms resolved within a few days of stopping the antibiotics.  She does not feel symptoms are related to chemotherapy.  She denies nausea/vomiting.  No mouth sores.  She estimates 2 loose stools a day.  No bleeding.  No abdominal pain.  No fever or rash following treatment.  No numbness or tingling in the hands or feet.  She reports recent stuffy nose and sneezing.    Objective:  Vital signs in last 24 hours:  Blood pressure (!) 150/84, pulse 99, temperature (!) 97.1 F (36.2 C), temperature source Tympanic, resp. rate 16, weight 161 lb 9.6 oz (73.3 kg), last menstrual period 02/07/2016, SpO2 100 %.    HEENT: No thrush or ulcers. Resp: Lungs clear bilaterally. Cardio: Regular rate and rhythm. GI: Abdomen soft and nontender.  No hepatosplenomegaly. Vascular: No leg edema. Neuro: Alert and oriented. Skin: No rash. Port-A-Cath without erythema.   Lab Results:  Lab Results  Component Value Date   WBC 11.7 (H) 08/31/2022   HGB 14.1 08/31/2022   HCT 42.6 08/31/2022   MCV 86.8 08/31/2022   PLT 344 08/31/2022   NEUTROABS 8.4 (H) 08/31/2022    Imaging:  No results found.  Medications: I have reviewed the patient's current medications.  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 CT abdomen/pelvis 07/30/2022-body/neck pancreas mass with peripheral pancreatic atrophy and duct dilation, suspected encasement of the celiac axis, uterine fibroids MRI abdomen 07/30/2022-3.7 x 4.7 cm pancreas body/tail mass with encasement of the proximal celiac  artery, occlusion with collateralization at the confluence of the SMV and portal vein, no evidence of metastatic disease CT chest 08/01/2022-no intrathoracic metastases Elevated CA 19-9 EUS 08/02/2022-gastric polyp, gastric erosions, 32 x 33 mm pancreas body mass, invasion of the celiac trunk, invasion of the splenoportal confluence, no SMA invasion, 2 malignant appearing nodes in the porta hepatis,T4N1 by endoscopic criteria, FNA biopsy-adenocarcinoma 08/17/2022 [employer]-tumor mutation burden 0.96; MSI high not detected; variant of uncertain clinical significance NTRK3 T93M Cycle 1 gemcitabine/Abraxane 08/17/2022 Cycle 2 gemcitabine/Abraxane 08/31/2022   2.  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  Anorexia/weight loss secondary to #1 4.  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  Abdominal pain 6.  History of colon polyps 7.  Family history of multiple cancers Pancreas cancer maternal aunt, maternal cousin, maternal grandfather Breast cancer-maternal aunt, maternal cousin Prostate cancer-maternal uncle Lung cancer-father 62.  Hypertension 9.  Postmenopausal vaginal bleeding 10.  H. pylori 08/02/2022-amoxicillin and Flagyl discontinued 08/21/2022 due to GI symptoms    Disposition: Ms. Tancredi appears improved.  She has completed 1 cycle of gemcitabine/Abraxane.  Plan to proceed with cycle 2 today as scheduled.  The GI symptoms she was experiencing 08/21/2022 may have been related to the H. pylori treatment which we discontinued.  The symptoms have resolved.  CBC reviewed.  Counts adequate to proceed with treatment.  Chemistry panel reviewed.  Potassium remains low.  She is not taking oral potassium as previously prescribed.  She will receive 20 meq IV and p.o. today.  She will begin oral  potassium 09/01/2022 as prescribed.  We will add a magnesium level to today's labs.  She will return for lab, follow-up, cycle 3  gemcitabine/Abraxane in 2 weeks.  She will contact the office in the interim with any problems.    Ned Card ANP/GNP-BC   08/31/2022  9:53 AM

## 2022-08-31 NOTE — Patient Instructions (Signed)
[facility]   Discharge Instructions: Thank you for choosing [facility] to provide your oncology and hematology care.   If you have a lab appointment with the [facility], please go directly to the [facility] and check in at the registration area.   Wear comfortable clothing and clothing appropriate for easy access to any Portacath or PICC line.   We strive to give you quality time with your provider. You may need to reschedule your appointment if you arrive late (15 or more minutes).  Arriving late affects you and other patients whose appointments are after yours.  Also, if you miss three or more appointments without notifying the office, you may be dismissed from the clinic at the provider's discretion.      For prescription refill requests, have your pharmacy contact our office and allow 72 hours for refills to be completed.    Today you received the following chemotherapy and/or immunotherapy agents Paclitaxel-protein bound (ABRAXANE) & Gemcitabine (GEMZAR).      To help prevent nausea and vomiting after your treatment, we encourage you to take your nausea medication as directed.  BELOW ARE SYMPTOMS THAT SHOULD BE REPORTED IMMEDIATELY: *FEVER GREATER THAN 100.4 F (38 C) OR HIGHER *CHILLS OR SWEATING *NAUSEA AND VOMITING THAT IS NOT CONTROLLED WITH YOUR NAUSEA MEDICATION *UNUSUAL SHORTNESS OF BREATH *UNUSUAL BRUISING OR BLEEDING *URINARY PROBLEMS (pain or burning when urinating, or frequent urination) *BOWEL PROBLEMS (unusual diarrhea, constipation, pain near the anus) TENDERNESS IN MOUTH AND THROAT WITH OR WITHOUT PRESENCE OF ULCERS (sore throat, sores in mouth, or a toothache) UNUSUAL RASH, SWELLING OR PAIN  UNUSUAL VAGINAL DISCHARGE OR ITCHING   Items with * indicate a potential emergency and should be followed up as soon as possible or go to the Emergency Department if any problems should occur.  Please show the CHEMOTHERAPY ALERT  CARD or IMMUNOTHERAPY ALERT CARD at check-in to the Emergency Department and triage nurse.  Should you have questions after your visit or need to cancel or reschedule your appointment, please contact [facility]  Dept: [phone]  and follow the prompts.  Office hours are 8:00 a.m. to 4:30 p.m. Monday - Friday. Please note that voicemails left after 4:00 p.m. may not be returned until the following business day.  We are closed weekends and major holidays. You have access to a nurse at all times for urgent questions. Please call the main number to the clinic Dept: [phone] and follow the prompts.   For any non-urgent questions, you may also contact your provider using MyChart. We now offer e-Visits for anyone 60 and older to request care online for non-urgent symptoms. For details visit [email or web address].   Also download the MyChart app! Go to the app store, search "MyChart", open the app, select [facility], and log in with your MyChart username and password.  Masks are optional in the cancer centers. If you would like for your care team to wear a mask while they are taking care of you, please let them know. You may have one support person who is at least 57 years old accompany you for your appointments.  Paclitaxel Nanoparticle Albumin-Bound Injection What is this medication? NANOPARTICLE ALBUMIN-BOUND PACLITAXEL (Na no PAHR ti kuhl al BYOO muhn-bound PAK li TAX el) treats some types of cancer. It works by slowing down the growth of cancer cells. This medicine may be used for other purposes; ask your health care provider or pharmacist if you  have questions. COMMON BRAND NAME(S): Abraxane What should I tell my care team before I take this medication? They need to know if you have any of these conditions: Liver disease Low white blood cell levels An unusual or allergic reaction to paclitaxel, albumin, other medications, foods, dyes, or preservatives If you  or your partner are pregnant or trying to get pregnant Breast-feeding How should I use this medication? This medication is injected into a vein. It is given by your care team in a hospital or clinic setting. Talk to your care team about the use of this medication in children. Special care may be needed. Overdosage: If you think you have taken too much of this medicine contact a poison control center or emergency room at once. NOTE: This medicine is only for you. Do not share this medicine with others. What if I miss a dose? Keep appointments for follow-up doses. It is important not to miss your dose. Call your care team if you are unable to keep an appointment. What may interact with this medication? Other medications may affect the way this medication works. Talk with your care team about all of the medications you take. They may suggest changes to your treatment plan to lower the risk of side effects and to make sure your medications work as intended. This list may not describe all possible interactions. Give your health care provider a list of all the medicines, herbs, non-prescription drugs, or dietary supplements you use. Also tell them if you smoke, drink alcohol, or use illegal drugs. Some items may interact with your medicine. What should I watch for while using this medication? Your condition will be monitored carefully while you are receiving this medication. You may need blood work while taking this medication. This medication may make you feel generally unwell. This is not uncommon as chemotherapy can affect healthy cells as well as cancer cells. Report any side effects. Continue your course of treatment even though you feel ill unless your care team tells you to stop. This medication can cause serious allergic reactions. To reduce the risk, your care team may give you other medications to take before receiving this one. Be sure to follow the directions from your care team. This  medication may increase your risk of getting an infection. Call your care team for advice if you get a fever, chills, sore throat, or other symptoms of a cold or flu. Do not treat yourself. Try to avoid being around people who are sick. This medication may increase your risk to bruise or bleed. Call your care team if you notice any unusual bleeding. Be careful brushing or flossing your teeth or using a toothpick because you may get an infection or bleed more easily. If you have any dental work done, tell your dentist you are receiving this medication. Talk to your care team if you or your partner may be pregnant. Serious birth defects can occur if you take this medication during pregnancy and for 6 months after the last dose. You will need a negative pregnancy test before starting this medication. Contraception is recommended while taking this medication and for 6 months after the last dose. Your care team can help you find the option that works for you. If your partner can get pregnant, use a condom during sex while taking this medication and for 3 months after the last dose. Do not breastfeed while taking this medication and for 2 weeks after the last dose. This medication may cause infertility. Talk  to your care team if you are concerned about your fertility. What side effects may I notice from receiving this medication? Side effects that you should report to your care team as soon as possible: Allergic reactions--skin rash, itching, hives, swelling of the face, lips, tongue, or throat Dry cough, shortness of breath or trouble breathing Infection--fever, chills, cough, sore throat, wounds that don't heal, pain or trouble when passing urine, general feeling of discomfort or being unwell Low red blood cell level--unusual weakness or fatigue, dizziness, headache, trouble breathing Pain, tingling, or numbness in the hands or feet Stomach pain, unusual weakness or fatigue, nausea, vomiting, diarrhea, or  fever that lasts longer than expected Unusual bruising or bleeding Side effects that usually do not require medical attention (report to your care team if they continue or are bothersome): Diarrhea Fatigue Hair loss Loss of appetite Nausea Vomiting This list may not describe all possible side effects. Call your doctor for medical advice about side effects. You may report side effects to FDA at 1-800-FDA-1088. Where should I keep my medication? This medication is given in a hospital or clinic. It will not be stored at home. NOTE: This sheet is a summary. It may not cover all possible information. If you have questions about this medicine, talk to your doctor, pharmacist, or health care provider.  2023 Elsevier/Gold Standard (2007-10-25 00:00:00)  Gemcitabine Injection What is this medication? GEMCITABINE (jem SYE ta been) treats some types of cancer. It works by slowing down the growth of cancer cells. This medicine may be used for other purposes; ask your health care provider or pharmacist if you have questions. COMMON BRAND NAME(S): Gemzar, Infugem What should I tell my care team before I take this medication? They need to know if you have any of these conditions: Blood disorders Infection Kidney disease Liver disease Lung or breathing disease, such as asthma or COPD Recent or ongoing radiation therapy An unusual or allergic reaction to gemcitabine, other medications, foods, dyes, or preservatives If you or your partner are pregnant or trying to get pregnant Breast-feeding How should I use this medication? This medication is injected into a vein. It is given by your care team in a hospital or clinic setting. Talk to your care team about the use of this medication in children. Special care may be needed. Overdosage: If you think you have taken too much of this medicine contact a poison control center or emergency room at once. NOTE: This medicine is only for you. Do not share this  medicine with others. What if I miss a dose? Keep appointments for follow-up doses. It is important not to miss your dose. Call your care team if you are unable to keep an appointment. What may interact with this medication? Interactions have not been studied. This list may not describe all possible interactions. Give your health care provider a list of all the medicines, herbs, non-prescription drugs, or dietary supplements you use. Also tell them if you smoke, drink alcohol, or use illegal drugs. Some items may interact with your medicine. What should I watch for while using this medication? Your condition will be monitored carefully while you are receiving this medication. This medication may make you feel generally unwell. This is not uncommon, as chemotherapy can affect healthy cells as well as cancer cells. Report any side effects. Continue your course of treatment even though you feel ill unless your care team tells you to stop. In some cases, you may be given additional medications to help  with side effects. Follow all directions for their use. This medication may increase your risk of getting an infection. Call your care team for advice if you get a fever, chills, sore throat, or other symptoms of a cold or flu. Do not treat yourself. Try to avoid being around people who are sick. This medication may increase your risk to bruise or bleed. Call your care team if you notice any unusual bleeding. Be careful brushing or flossing your teeth or using a toothpick because you may get an infection or bleed more easily. If you have any dental work done, tell your dentist you are receiving this medication. Avoid taking medications that contain aspirin, acetaminophen, ibuprofen, naproxen, or ketoprofen unless instructed by your care team. These medications may hide a fever. Talk to your care team if you or your partner wish to become pregnant or think you might be pregnant. This medication can cause  serious birth defects if taken during pregnancy and for 6 months after the last dose. A negative pregnancy test is required before starting this medication. A reliable form of contraception is recommended while taking this medication and for 6 months after the last dose. Talk to your care team about effective forms of contraception. Do not father a child while taking this medication and for 3 months after the last dose. Use a condom while having sex during this time period. Do not breastfeed while taking this medication and for at least 1 week after the last dose. This medication may cause infertility. Talk to your care team if you are concerned about your fertility. What side effects may I notice from receiving this medication? Side effects that you should report to your care team as soon as possible: Allergic reactions--skin rash, itching, hives, swelling of the face, lips, tongue, or throat Capillary leak syndrome--stomach or muscle pain, unusual weakness or fatigue, feeling faint or lightheaded, decrease in the amount of urine, swelling of the ankles, hands, or feet, trouble breathing Infection--fever, chills, cough, sore throat, wounds that don't heal, pain or trouble when passing urine, general feeling of discomfort or being unwell Liver injury--right upper belly pain, loss of appetite, nausea, light-colored stool, dark yellow or brown urine, yellowing skin or eyes, unusual weakness or fatigue Low red blood cell level--unusual weakness or fatigue, dizziness, headache, trouble breathing Lung injury--shortness of breath or trouble breathing, cough, spitting up blood, chest pain, fever Stomach pain, bloody diarrhea, pale skin, unusual weakness or fatigue, decrease in the amount of urine, which may be signs of hemolytic uremic syndrome Sudden and severe headache, confusion, change in vision, seizures, which may be signs of posterior reversible encephalopathy syndrome (PRES) Unusual bruising or  bleeding Side effects that usually do not require medical attention (report to your care team if they continue or are bothersome): Diarrhea Drowsiness Hair loss Nausea Pain, redness, or swelling with sores inside the mouth or throat Vomiting This list may not describe all possible side effects. Call your doctor for medical advice about side effects. You may report side effects to FDA at 1-800-FDA-1088. Where should I keep my medication? This medication is given in a hospital or clinic. It will not be stored at home. NOTE: This sheet is a summary. It may not cover all possible information. If you have questions about this medicine, talk to your doctor, pharmacist, or health care provider.  2023 Elsevier/Gold Standard (2007-10-25 00:00:00)

## 2022-08-31 NOTE — Telephone Encounter (Signed)
CRITICAL VALUE STICKER  CRITICAL VALUE: Potassium   RECEIVER (on-site recipient of call):Darci Lykins M  DATE & TIME NOTIFIED:  08/31/2022  1015 MESSENGER (representative from lab): Sunni  MD NOTIFIED: Ned Card  TIME OF NOTIFICATION: 08/31/22   RESPONSE: Provider is aware and patient is receive Potassium by oral  and IV today.

## 2022-08-31 NOTE — Progress Notes (Signed)
Patient seen by Ned Card NP today  Vitals are within treatment parameters.  Labs reviewed by Ned Card NP and are not all within treatment parameters. Potassium is 2.7. Provider placed orders in for IV and oral potassium  Per physician team, patient is ready for treatment and there are NO modifications to the treatment plan.

## 2022-09-04 ENCOUNTER — Telehealth: Payer: Self-pay

## 2022-09-04 ENCOUNTER — Other Ambulatory Visit: Payer: Self-pay | Admitting: *Deleted

## 2022-09-04 DIAGNOSIS — C251 Malignant neoplasm of body of pancreas: Secondary | ICD-10-CM

## 2022-09-04 NOTE — Telephone Encounter (Signed)
Patient is schedule to come in.

## 2022-09-04 NOTE — Telephone Encounter (Signed)
-----   Message from Owens Shark, NP sent at 09/04/2022  8:19 AM EST ----- Please have her come in for a repeat basic metabolic panel this week to follow-up low potassium.

## 2022-09-05 ENCOUNTER — Inpatient Hospital Stay: Payer: Commercial Managed Care - HMO

## 2022-09-05 DIAGNOSIS — E876 Hypokalemia: Secondary | ICD-10-CM

## 2022-09-05 DIAGNOSIS — Z95828 Presence of other vascular implants and grafts: Secondary | ICD-10-CM

## 2022-09-05 DIAGNOSIS — E860 Dehydration: Secondary | ICD-10-CM

## 2022-09-05 DIAGNOSIS — C251 Malignant neoplasm of body of pancreas: Secondary | ICD-10-CM

## 2022-09-05 DIAGNOSIS — Z5111 Encounter for antineoplastic chemotherapy: Secondary | ICD-10-CM | POA: Diagnosis not present

## 2022-09-05 LAB — BASIC METABOLIC PANEL
Anion gap: 12 (ref 5–15)
BUN: 13 mg/dL (ref 6–20)
CO2: 31 mmol/L (ref 22–32)
Calcium: 9.5 mg/dL (ref 8.9–10.3)
Chloride: 102 mmol/L (ref 98–111)
Creatinine, Ser: 0.61 mg/dL (ref 0.44–1.00)
GFR, Estimated: >60 mL/min (ref >60)
Glucose, Bld: 123 mg/dL — ABNORMAL HIGH (ref 70–99)
Potassium: 2.9 mmol/L — ABNORMAL LOW (ref 3.5–5.1)
Sodium: 145 mmol/L (ref 135–145)

## 2022-09-05 MED ORDER — SODIUM CHLORIDE 0.9 % IV SOLN: INTRAVENOUS | Status: DC

## 2022-09-05 MED ORDER — HEPARIN SOD (PORK) LOCK FLUSH 100 UNIT/ML IV SOLN
500.0000 [IU] | Freq: Once | INTRAVENOUS | Status: AC
  Administered 2022-09-05: 500 [IU] via INTRAVENOUS

## 2022-09-05 MED ORDER — SODIUM CHLORIDE 0.9 % IV SOLN: 8.0000 mg | Freq: Once | INTRAVENOUS | Status: DC

## 2022-09-05 MED ORDER — POTASSIUM CHLORIDE 10 MEQ/100ML IV SOLN
10.0000 meq | INTRAVENOUS | Status: AC
  Administered 2022-09-05 (×2): 10 meq via INTRAVENOUS
  Filled 2022-09-05: qty 100

## 2022-09-05 MED ORDER — ONDANSETRON HCL 4 MG/2ML IJ SOLN
8.0000 mg | Freq: Once | INTRAMUSCULAR | Status: AC
  Administered 2022-09-05: 8 mg via INTRAVENOUS
  Filled 2022-09-05: qty 4

## 2022-09-05 MED ORDER — SODIUM CHLORIDE 0.9% FLUSH
10.0000 mL | INTRAVENOUS | Status: DC | PRN
  Administered 2022-09-05: 10 mL via INTRAVENOUS

## 2022-09-05 NOTE — Patient Instructions (Signed)
Potassium Acetate Injection What is this medication? POTASSIUM ACETATE (poe TASS i um ASa tate) prevents and treats low levels of potassium in your body. Potassium plays an important role in maintaining the health of your kidneys, heart, muscles, and nervous system. This medicine may be used for other purposes; ask your health care provider or pharmacist if you have questions. What should I tell my care team before I take this medication? They need to know if you have any of these conditions: Addison's disease Dehydration Diabetes Heart disease High levels of potassium in the blood Irregular heartbeat Kidney disease Liver disease Recent severe burn An unusual or allergic reaction to potassium, other medications, foods, dyes, or preservatives Pregnant or trying to get pregnant Breast-feeding How should I use this medication? This medication is for infusion into a vein. It is given in a hospital or clinic setting. Talk to your care team about the use of this medication in children. Special care may be needed. Overdosage: If you think you have taken too much of this medicine contact a poison control center or emergency room at once. NOTE: This medicine is only for you. Do not share this medicine with others. What if I miss a dose? This does not apply. What may interact with this medication? Do not take this medication with any of the following: Certain diuretics such as spironolactone, triamterene Eplerenone Sodium polystyrene sulfonate This medication may also interact with the following: Certain medications for blood pressure or heart disease like lisinopril, losartan, quinapril, valsartan Medications that lower your chance of fighting infection such as cyclosporine, tacrolimus NSAIDs, medications for pain and inflammation, like ibuprofen or naproxen Other potassium supplements Salt substitutes This list may not describe all possible interactions. Give your health care provider a  list of all the medicines, herbs, non-prescription drugs, or dietary supplements you use. Also tell them if you smoke, drink alcohol, or use illegal drugs. Some items may interact with your medicine. What should I watch for while using this medication? Your condition will be monitored carefully while you are receiving this medication. You may need blood work done while you are taking this medication. What side effects may I notice from receiving this medication? Side effects that you should report to your care team as soon as possible: Allergic reactions--skin rash, itching, hives, swelling of the face, lips, tongue, or throat High potassium level--muscle weakness, fast or irregular heartbeat Side effects that usually do not require medical attention (report these to your care team if they continue or are bothersome): Pain, redness, or irritation at injection site This list may not describe all possible side effects. Call your doctor for medical advice about side effects. You may report side effects to FDA at 1-800-FDA-1088. Where should I keep my medication? This medication is given in a hospital or clinic and will not be stored at home. NOTE: This sheet is a summary. It may not cover all possible information. If you have questions about this medicine, talk to your doctor, pharmacist, or health care provider.  2023 Elsevier/Gold Standard (2021-05-11 00:00:00) Ondansetron Injection What is this medication? ONDANSETRON (on DAN se tron) prevents nausea and vomiting from chemotherapy, radiation, or surgery. It works by blocking substances in the body that may cause nausea or vomiting. It belongs to a class of medications called antiemetics. This medicine may be used for other purposes; ask your health care provider or pharmacist if you have questions. COMMON BRAND NAME(S): Zofran, Zofran in Dextrose, Zofran Solution What should I tell my  care team before I take this medication? They need to know  if you have any of these conditions: Heart disease History of irregular heartbeat Liver disease Low levels of magnesium or potassium in the blood An unusual or allergic reaction to ondansetron, granisetron, other medications, foods, dyes, or preservatives Pregnant or trying to get pregnant Breast-feeding How should I use this medication? This medication is injected into a vein. It is given by your care team in a hospital or clinic setting. Talk to your care team about the use of this medication in children. Special care may be needed. Overdosage: If you think you have taken too much of this medicine contact a poison control center or emergency room at once. NOTE: This medicine is only for you. Do not share this medicine with others. What if I miss a dose? This does not apply. What may interact with this medication? Do not take this medication with any of the following: Apomorphine Certain medications for fungal infections, such as fluconazole, itraconazole, ketoconazole, posaconazole, voriconazole Cisapride Dronedarone Pimozide Thioridazine This medication may also interact with the following: Carbamazepine Certain medications for depression, anxiety, or mental health conditions Fentanyl Linezolid MAOIs, such as Carbex, Eldepryl, Marplan, Nardil, and Parnate Methylene blue (injected into a vein) Other medications that cause heart rhythm changes, such as dofetilide, ziprasidone Phenytoin Rifampicin Tramadol This list may not describe all possible interactions. Give your health care provider a list of all the medicines, herbs, non-prescription drugs, or dietary supplements you use. Also tell them if you smoke, drink alcohol, or use illegal drugs. Some items may interact with your medicine. What should I watch for while using this medication? Your condition will be monitored carefully while you are receiving this medication. What side effects may I notice from receiving this  medication? Side effects that you should report to your care team as soon as possible: Allergic reactions--skin rash, itching, hives, swelling of the face, lips, tongue, or throat Bowel blockage--stomach cramping, unable to have a bowel movement or pass gas, loss of appetite, vomiting Chest pain (angina)--pain, pressure, or tightness in the chest, neck, back, or arms Heart rhythm changes--fast or irregular heartbeat, dizziness, feeling faint or lightheaded, chest pain, trouble breathing Irritability, confusion, fast or irregular heartbeat, muscle stiffness, twitching muscles, sweating, high fever, seizure, chills, vomiting, diarrhea, which may be signs of serotonin syndrome Side effects that usually do not require medical attention (report to your care team if they continue or are bothersome): Constipation Diarrhea General discomfort and fatigue Headache This list may not describe all possible side effects. Call your doctor for medical advice about side effects. You may report side effects to FDA at 1-800-FDA-1088. Where should I keep my medication? This medication is given in a hospital or clinic and will not be stored at home. NOTE: This sheet is a summary. It may not cover all possible information. If you have questions about this medicine, talk to your doctor, pharmacist, or health care provider.  2023 Elsevier/Gold Standard (2004-09-22 00:00:00)

## 2022-09-10 ENCOUNTER — Other Ambulatory Visit: Payer: Self-pay | Admitting: Oncology

## 2022-09-14 ENCOUNTER — Inpatient Hospital Stay: Payer: Commercial Managed Care - HMO | Admitting: Nurse Practitioner

## 2022-09-14 ENCOUNTER — Inpatient Hospital Stay: Payer: Commercial Managed Care - HMO

## 2022-09-14 ENCOUNTER — Encounter: Payer: Self-pay | Admitting: Nurse Practitioner

## 2022-09-14 ENCOUNTER — Other Ambulatory Visit: Payer: Self-pay

## 2022-09-14 VITALS — BP 138/72 | HR 98 | Temp 98.1°F | Resp 18 | Ht 66.0 in | Wt 155.4 lb

## 2022-09-14 VITALS — BP 138/61 | HR 78

## 2022-09-14 DIAGNOSIS — C251 Malignant neoplasm of body of pancreas: Secondary | ICD-10-CM

## 2022-09-14 DIAGNOSIS — Z5111 Encounter for antineoplastic chemotherapy: Secondary | ICD-10-CM | POA: Diagnosis not present

## 2022-09-14 LAB — CMP (CANCER CENTER ONLY)
ALT: 70 U/L — ABNORMAL HIGH (ref 0–44)
AST: 56 U/L — ABNORMAL HIGH (ref 15–41)
Albumin: 3.6 g/dL (ref 3.5–5.0)
Alkaline Phosphatase: 79 U/L (ref 38–126)
Anion gap: 10 (ref 5–15)
BUN: 12 mg/dL (ref 6–20)
CO2: 32 mmol/L (ref 22–32)
Calcium: 9.5 mg/dL (ref 8.9–10.3)
Chloride: 102 mmol/L (ref 98–111)
Creatinine: 0.71 mg/dL (ref 0.44–1.00)
GFR, Estimated: >60 mL/min (ref >60)
Glucose, Bld: 127 mg/dL — ABNORMAL HIGH (ref 70–99)
Potassium: 3.1 mmol/L — ABNORMAL LOW (ref 3.5–5.1)
Sodium: 144 mmol/L (ref 135–145)
Total Bilirubin: 2.2 mg/dL — ABNORMAL HIGH (ref 0.3–1.2)
Total Protein: 6.8 g/dL (ref 6.5–8.1)

## 2022-09-14 LAB — CBC WITH DIFFERENTIAL (CANCER CENTER ONLY)
Abs Immature Granulocytes: 0.14 10*3/uL — ABNORMAL HIGH (ref 0.00–0.07)
Basophils Absolute: 0 10*3/uL (ref 0.0–0.1)
Basophils Relative: 0 %
Eosinophils Absolute: 0 10*3/uL (ref 0.0–0.5)
Eosinophils Relative: 0 %
HCT: 39.6 % (ref 36.0–46.0)
Hemoglobin: 13.1 g/dL (ref 12.0–15.0)
Immature Granulocytes: 2 %
Lymphocytes Relative: 20 %
Lymphs Abs: 1.8 10*3/uL (ref 0.7–4.0)
MCH: 28.9 pg (ref 26.0–34.0)
MCHC: 33.1 g/dL (ref 30.0–36.0)
MCV: 87.2 fL (ref 80.0–100.0)
Monocytes Absolute: 1.2 10*3/uL — ABNORMAL HIGH (ref 0.1–1.0)
Monocytes Relative: 14 %
Neutro Abs: 5.8 10*3/uL (ref 1.7–7.7)
Neutrophils Relative %: 64 %
Platelet Count: 207 10*3/uL (ref 150–400)
RBC: 4.54 MIL/uL (ref 3.87–5.11)
RDW: 18.3 % — ABNORMAL HIGH (ref 11.5–15.5)
WBC Count: 8.9 10*3/uL (ref 4.0–10.5)
nRBC: 0.4 % — ABNORMAL HIGH (ref 0.0–0.2)

## 2022-09-14 MED ORDER — PANTOPRAZOLE SODIUM 40 MG PO TBEC: 40.0000 mg | DELAYED_RELEASE_TABLET | Freq: Every day | ORAL | 1 refills | Status: DC

## 2022-09-14 MED ORDER — SODIUM CHLORIDE 0.9 % IV SOLN: Freq: Once | INTRAVENOUS | Status: AC

## 2022-09-14 MED ORDER — HEPARIN SOD (PORK) LOCK FLUSH 100 UNIT/ML IV SOLN
500.0000 [IU] | Freq: Once | INTRAVENOUS | Status: AC | PRN
  Administered 2022-09-14: 500 [IU]

## 2022-09-14 MED ORDER — PROCHLORPERAZINE MALEATE 10 MG PO TABS
10.0000 mg | ORAL_TABLET | Freq: Once | ORAL | Status: AC
  Administered 2022-09-14: 10 mg via ORAL
  Filled 2022-09-14: qty 1

## 2022-09-14 MED ORDER — SODIUM CHLORIDE 0.9% FLUSH
10.0000 mL | INTRAVENOUS | Status: DC | PRN
  Administered 2022-09-14: 10 mL

## 2022-09-14 MED ORDER — PACLITAXEL PROTEIN-BOUND CHEMO INJECTION 100 MG
100.0000 mg/m2 | Freq: Once | INTRAVENOUS | Status: AC
  Administered 2022-09-14: 200 mg via INTRAVENOUS
  Filled 2022-09-14: qty 40

## 2022-09-14 MED ORDER — ALTEPLASE 2 MG IJ SOLR
2.0000 mg | Freq: Once | INTRAMUSCULAR | Status: AC
  Administered 2022-09-14: 2 mg
  Filled 2022-09-14: qty 2

## 2022-09-14 MED ORDER — SODIUM CHLORIDE 0.9 % IV SOLN
2000.0000 mg | Freq: Once | INTRAVENOUS | Status: AC
  Administered 2022-09-14: 2000 mg via INTRAVENOUS
  Filled 2022-09-14: qty 52.6

## 2022-09-14 NOTE — Progress Notes (Signed)
Patient seen by Ned Card, NP today  Vitals are within treatment parameters.   Labs reviewed by Ned Card, NP and are NOT within treatment parameters. Per Ned Card, ok to treat with total bili 2.2.  Per physician team, patient is ready for treatment and there are NO modifications to the treatment plan.

## 2022-09-14 NOTE — Progress Notes (Signed)
  [facility] OFFICE PROGRESS NOTE   Diagnosis: Pancreas cancer  INTERVAL HISTORY:   Kathleen Stone returns as scheduled.  She completed cycle 2 gemcitabine/Abraxane 08/31/2022.  She denies nausea/vomiting.  No mouth sores.  No diarrhea.  No abdominal pain.  She noted some blood when she brushed her teeth this morning.  No other bleeding.  Recent "runny nose" and cough.  No fever.  No shortness of breath.  Appetite remains poor.  Objective:  Vital signs in last 24 hours:  Blood pressure 138/72, pulse 98, temperature 98.1 F (36.7 C), temperature source Oral, resp. rate 18, height _0  (1.676 m), weight 155 lb 6.4 oz (70.5 kg), last menstrual period 02/07/2016, SpO2 100 %.    HEENT: No thrush or ulcers.  No bleeding noted in the oral cavity. Resp: Lungs clear bilaterally. Cardio: Regular rate and rhythm. GI: Abdomen soft and nontender.  No hepatosplenomegaly. Vascular: No leg edema. Port-A-Cath without erythema.  Lab Results:  Lab Results  Component Value Date   WBC 8.9 09/14/2022   HGB 13.1 09/14/2022   HCT 39.6 09/14/2022   MCV 87.2 09/14/2022   PLT 207 09/14/2022   NEUTROABS 5.8 09/14/2022    Imaging:  No results found.  Medications: I have reviewed the patient's current medications.  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 CT abdomen/pelvis 07/30/2022-body/neck pancreas mass with peripheral pancreatic atrophy and duct dilation, suspected encasement of the celiac axis, uterine fibroids MRI abdomen 07/30/2022-3.7 x 4.7 cm pancreas body/tail mass with encasement of the proximal celiac artery, occlusion with collateralization at the confluence of the SMV and portal vein, no evidence of metastatic disease CT chest 08/01/2022-no intrathoracic metastases Elevated CA 19-9 EUS 08/02/2022-gastric polyp, gastric erosions, 32 x 33 mm pancreas body mass, invasion of the celiac trunk, invasion of the splenoportal confluence, no SMA invasion, 2 malignant appearing nodes  in the porta hepatis,T4N1 by endoscopic criteria, FNA biopsy-adenocarcinoma 08/17/2022 Guardant360-tumor mutation burden 0.96; MSI high not detected; variant of uncertain clinical significance NTRK3 T93M Cycle 1 gemcitabine/Abraxane 08/17/2022 Cycle 2 gemcitabine/Abraxane 08/31/2022 Cycle 3 gemcitabine/Abraxane 09/14/2022   2.  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  Anorexia/weight loss secondary to #1 4.  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  Abdominal pain 6.  History of colon polyps 7.  Family history of multiple cancers Pancreas cancer maternal aunt, maternal cousin, maternal grandfather Breast cancer-maternal aunt, maternal cousin Prostate cancer-maternal uncle Lung cancer-father 82.  Hypertension 9.  Postmenopausal vaginal bleeding 10.  H. pylori 08/02/2022-amoxicillin and Flagyl discontinued 08/21/2022 due to GI symptoms    Disposition: Kathleen Stone appears stable.  She has completed 2 cycles of gemcitabine/Abraxane.  Plan to proceed with cycle 3 today as scheduled.  We will follow-up on the CA 19-9 from today.    She has lost more weight.  No dose reduction required.  Referral made to the [facility] dietitian.  She was provided with some nutritional supplements today.  CBC and chemistry panel reviewed.  Labs adequate to proceed with treatment.  She has persistent hypokalemia.  She is not taking oral potassium as prescribed.  She will begin Micro-K 10 meq twice daily.  Bilirubin is stable, intermittently elevated.  She will return for lab, follow-up, cycle 4 gemcitabine/Abraxane in 2 weeks.    Ned Card ANP/GNP-BC   09/14/2022  9:03 AM

## 2022-09-14 NOTE — Patient Instructions (Signed)
Kathleen Stone   Discharge Instructions: Thank you for choosing [facility] to provide your oncology and hematology care.   If you have a lab appointment with the [facility], please go directly to the [facility] and check in at the registration area.   Wear comfortable clothing and clothing appropriate for easy access to any Portacath or PICC line.   We strive to give you quality time with your provider. You may need to reschedule your appointment if you arrive late (15 or more minutes).  Arriving late affects you and other patients whose appointments are after yours.  Also, if you miss three or more appointments without notifying the office, you may be dismissed from the clinic at the provider's discretion.      For prescription refill requests, have your pharmacy contact our office and allow 72 hours for refills to be completed.    Today you received the following chemotherapy and/or immunotherapy agents Abraxane, Gemzar      To help prevent nausea and vomiting after your treatment, we encourage you to take your nausea medication as directed.  BELOW ARE SYMPTOMS THAT SHOULD BE REPORTED IMMEDIATELY: *FEVER GREATER THAN 100.4 F (38 C) OR HIGHER *CHILLS OR SWEATING *NAUSEA AND VOMITING THAT IS NOT CONTROLLED WITH YOUR NAUSEA MEDICATION *UNUSUAL SHORTNESS OF BREATH *UNUSUAL BRUISING OR BLEEDING *URINARY PROBLEMS (pain or burning when urinating, or frequent urination) *BOWEL PROBLEMS (unusual diarrhea, constipation, pain near the anus) TENDERNESS IN MOUTH AND THROAT WITH OR WITHOUT PRESENCE OF ULCERS (sore throat, sores in mouth, or a toothache) UNUSUAL RASH, SWELLING OR PAIN  UNUSUAL VAGINAL DISCHARGE OR ITCHING   Items with * indicate a potential emergency and should be followed up as soon as possible or go to the Emergency Department if any problems should occur. Paclitaxel Nanoparticle Albumin-Bound Injection What is this  medication? NANOPARTICLE ALBUMIN-BOUND PACLITAXEL (Na no PAHR ti kuhl al BYOO muhn-bound PAK li TAX el) treats some types of cancer. It works by slowing down the growth of cancer cells. This medicine may be used for other purposes; ask your health care provider or pharmacist if you have questions. COMMON BRAND NAME(S): Abraxane What should I tell my care team before I take this medication? They need to know if you have any of these conditions: Liver disease Low white blood cell levels An unusual or allergic reaction to paclitaxel, albumin, other medications, foods, dyes, or preservatives If you or your partner are pregnant or trying to get pregnant Breast-feeding How should I use this medication? This medication is injected into a vein. It is given by your care team in a hospital or clinic setting. Talk to your care team about the use of this medication in children. Special care may be needed. Overdosage: If you think you have taken too much of this medicine contact a poison control center or emergency room at once. NOTE: This medicine is only for you. Do not share this medicine with others. What if I miss a dose? Keep appointments for follow-up doses. It is important not to miss your dose. Call your care team if you are unable to keep an appointment. What may interact with this medication? Other medications may affect the way this medication works. Talk with your care team about all of the medications you take. They may suggest changes to your treatment plan to lower the risk of side effects and to make sure your medications work as intended. This list may not describe all possible  interactions. Give your health care provider a list of all the medicines, herbs, non-prescription drugs, or dietary supplements you use. Also tell them if you smoke, drink alcohol, or use illegal drugs. Some items may interact with your medicine. What should I watch for while using this medication? Your condition  will be monitored carefully while you are receiving this medication. You may need blood work while taking this medication. This medication may make you feel generally unwell. This is not uncommon as chemotherapy can affect healthy cells as well as cancer cells. Report any side effects. Continue your course of treatment even though you feel ill unless your care team tells you to stop. This medication can cause serious allergic reactions. To reduce the risk, your care team may give you other medications to take before receiving this one. Be sure to follow the directions from your care team. This medication may increase your risk of getting an infection. Call your care team for advice if you get a fever, chills, sore throat, or other symptoms of a cold or flu. Do not treat yourself. Try to avoid being around people who are sick. This medication may increase your risk to bruise or bleed. Call your care team if you notice any unusual bleeding. Be careful brushing or flossing your teeth or using a toothpick because you may get an infection or bleed more easily. If you have any dental work done, tell your dentist you are receiving this medication. Talk to your care team if you or your partner may be pregnant. Serious birth defects can occur if you take this medication during pregnancy and for 6 months after the last dose. You will need a negative pregnancy test before starting this medication. Contraception is recommended while taking this medication and for 6 months after the last dose. Your care team can help you find the option that works for you. If your partner can get pregnant, use a condom during sex while taking this medication and for 3 months after the last dose. Do not breastfeed while taking this medication and for 2 weeks after the last dose. This medication may cause infertility. Talk to your care team if you are concerned about your fertility. What side effects may I notice from receiving this  medication? Side effects that you should report to your care team as soon as possible: Allergic reactions--skin rash, itching, hives, swelling of the face, lips, tongue, or throat Dry cough, shortness of breath or trouble breathing Infection--fever, chills, cough, sore throat, wounds that don't heal, pain or trouble when passing urine, general feeling of discomfort or being unwell Low red blood cell level--unusual weakness or fatigue, dizziness, headache, trouble breathing Pain, tingling, or numbness in the hands or feet Stomach pain, unusual weakness or fatigue, nausea, vomiting, diarrhea, or fever that lasts longer than expected Unusual bruising or bleeding Side effects that usually do not require medical attention (report to your care team if they continue or are bothersome): Diarrhea Fatigue Hair loss Loss of appetite Nausea Vomiting This list may not describe all possible side effects. Call your doctor for medical advice about side effects. You may report side effects to FDA at 1-800-FDA-1088. Where should I keep my medication? This medication is given in a hospital or clinic. It will not be stored at home. NOTE: This sheet is a summary. It may not cover all possible information. If you have questions about this medicine, talk to your doctor, pharmacist, or health care provider.  2023 Elsevier/Gold Standard (2007-10-25 00:00:00)  Please show the CHEMOTHERAPY ALERT CARD or IMMUNOTHERAPY ALERT CARD at check-in to the Emergency Department and triage nurse.  Should you have questions after your visit or need to cancel or reschedule your appointment, please contact [facility]  Dept: [phone]  and follow the prompts.  Office hours are 8:00 a.m. to 4:30 p.m. Monday - Friday. Please note that voicemails left after 4:00 p.m. may not be returned until the following business day.  We are closed weekends and major holidays. You have access to a nurse at all times  for urgent questions. Please call the main number to the clinic Dept: [phone] and follow the prompts.   For any non-urgent questions, you may also contact your provider using MyChart. We now offer e-Visits for anyone 91 and older to request care online for non-urgent symptoms. For details visit [email or web address].   Also download the MyChart app! Go to the app store, search "MyChart", open the app, select [facility], and log in with your MyChart username and password.  Gemcitabine Injection What is this medication? GEMCITABINE (jem SYE ta been) treats some types of cancer. It works by slowing down the growth of cancer cells. This medicine may be used for other purposes; ask your health care provider or pharmacist if you have questions. COMMON BRAND NAME(S): Gemzar, Infugem What should I tell my care team before I take this medication? They need to know if you have any of these conditions: Blood disorders Infection Kidney disease Liver disease Lung or breathing disease, such as asthma or COPD Recent or ongoing radiation therapy An unusual or allergic reaction to gemcitabine, other medications, foods, dyes, or preservatives If you or your partner are pregnant or trying to get pregnant Breast-feeding How should I use this medication? This medication is injected into a vein. It is given by your care team in a hospital or clinic setting. Talk to your care team about the use of this medication in children. Special care may be needed. Overdosage: If you think you have taken too much of this medicine contact a poison control center or emergency room at once. NOTE: This medicine is only for you. Do not share this medicine with others. What if I miss a dose? Keep appointments for follow-up doses. It is important not to miss your dose. Call your care team if you are unable to keep an appointment. What may interact with this medication? Interactions have not been studied. This list may  not describe all possible interactions. Give your health care provider a list of all the medicines, herbs, non-prescription drugs, or dietary supplements you use. Also tell them if you smoke, drink alcohol, or use illegal drugs. Some items may interact with your medicine. What should I watch for while using this medication? Your condition will be monitored carefully while you are receiving this medication. This medication may make you feel generally unwell. This is not uncommon, as chemotherapy can affect healthy cells as well as cancer cells. Report any side effects. Continue your course of treatment even though you feel ill unless your care team tells you to stop. In some cases, you may be given additional medications to help with side effects. Follow all directions for their use. This medication may increase your risk of getting an infection. Call your care team for advice if you get a fever, chills, sore throat, or other symptoms of a cold or flu. Do not treat yourself. Try to avoid being around people who are sick. This  medication may increase your risk to bruise or bleed. Call your care team if you notice any unusual bleeding. Be careful brushing or flossing your teeth or using a toothpick because you may get an infection or bleed more easily. If you have any dental work done, tell your dentist you are receiving this medication. Avoid taking medications that contain aspirin, acetaminophen, ibuprofen, naproxen, or ketoprofen unless instructed by your care team. These medications may hide a fever. Talk to your care team if you or your partner wish to become pregnant or think you might be pregnant. This medication can cause serious birth defects if taken during pregnancy and for 6 months after the last dose. A negative pregnancy test is required before starting this medication. A reliable form of contraception is recommended while taking this medication and for 6 months after the last dose. Talk to your  care team about effective forms of contraception. Do not father a child while taking this medication and for 3 months after the last dose. Use a condom while having sex during this time period. Do not breastfeed while taking this medication and for at least 1 week after the last dose. This medication may cause infertility. Talk to your care team if you are concerned about your fertility. What side effects may I notice from receiving this medication? Side effects that you should report to your care team as soon as possible: Allergic reactions--skin rash, itching, hives, swelling of the face, lips, tongue, or throat Capillary leak syndrome--stomach or muscle pain, unusual weakness or fatigue, feeling faint or lightheaded, decrease in the amount of urine, swelling of the ankles, hands, or feet, trouble breathing Infection--fever, chills, cough, sore throat, wounds that don't heal, pain or trouble when passing urine, general feeling of discomfort or being unwell Liver injury--right upper belly pain, loss of appetite, nausea, light-colored stool, dark yellow or brown urine, yellowing skin or eyes, unusual weakness or fatigue Low red blood cell level--unusual weakness or fatigue, dizziness, headache, trouble breathing Lung injury--shortness of breath or trouble breathing, cough, spitting up blood, chest pain, fever Stomach pain, bloody diarrhea, pale skin, unusual weakness or fatigue, decrease in the amount of urine, which may be signs of hemolytic uremic syndrome Sudden and severe headache, confusion, change in vision, seizures, which may be signs of posterior reversible encephalopathy syndrome (PRES) Unusual bruising or bleeding Side effects that usually do not require medical attention (report to your care team if they continue or are bothersome): Diarrhea Drowsiness Hair loss Nausea Pain, redness, or swelling with sores inside the mouth or throat Vomiting This list may not describe all possible  side effects. Call your doctor for medical advice about side effects. You may report side effects to FDA at 1-800-FDA-1088. Where should I keep my medication? This medication is given in a hospital or clinic. It will not be stored at home. NOTE: This sheet is a summary. It may not cover all possible information. If you have questions about this medicine, talk to your doctor, pharmacist, or health care provider.  2023 Elsevier/Gold Standard (2007-10-25 00:00:00)

## 2022-09-15 ENCOUNTER — Other Ambulatory Visit: Payer: Self-pay

## 2022-09-15 LAB — CANCER ANTIGEN 19-9: CA 19-9: 409 U/mL — ABNORMAL HIGH (ref 0–35)

## 2022-09-22 ENCOUNTER — Other Ambulatory Visit: Payer: Self-pay | Admitting: Oncology

## 2022-09-25 ENCOUNTER — Encounter: Payer: Self-pay | Admitting: Oncology

## 2022-09-25 NOTE — Telephone Encounter (Signed)
Error

## 2022-09-25 NOTE — Telephone Encounter (Signed)
Chart review.

## 2022-09-26 ENCOUNTER — Inpatient Hospital Stay: Payer: Commercial Managed Care - HMO | Attending: Nurse Practitioner | Admitting: Nutrition

## 2022-09-26 DIAGNOSIS — Z801 Family history of malignant neoplasm of trachea, bronchus and lung: Secondary | ICD-10-CM | POA: Insufficient documentation

## 2022-09-26 DIAGNOSIS — I10 Essential (primary) hypertension: Secondary | ICD-10-CM | POA: Insufficient documentation

## 2022-09-26 DIAGNOSIS — Z800 Family history of malignant neoplasm of digestive organs: Secondary | ICD-10-CM | POA: Insufficient documentation

## 2022-09-26 DIAGNOSIS — N950 Postmenopausal bleeding: Secondary | ICD-10-CM | POA: Insufficient documentation

## 2022-09-26 DIAGNOSIS — Z5111 Encounter for antineoplastic chemotherapy: Secondary | ICD-10-CM | POA: Insufficient documentation

## 2022-09-26 DIAGNOSIS — C251 Malignant neoplasm of body of pancreas: Secondary | ICD-10-CM | POA: Insufficient documentation

## 2022-09-26 DIAGNOSIS — Z8719 Personal history of other diseases of the digestive system: Secondary | ICD-10-CM | POA: Insufficient documentation

## 2022-09-26 DIAGNOSIS — R630 Anorexia: Secondary | ICD-10-CM | POA: Insufficient documentation

## 2022-09-26 DIAGNOSIS — Z79899 Other long term (current) drug therapy: Secondary | ICD-10-CM | POA: Insufficient documentation

## 2022-09-26 NOTE — Progress Notes (Signed)
Contacted patient by telephone for nutrition follow up. There was no answer. Left name and phone number for return call.

## 2022-09-28 ENCOUNTER — Inpatient Hospital Stay: Payer: Commercial Managed Care - HMO

## 2022-09-28 ENCOUNTER — Telehealth: Payer: Self-pay | Admitting: Oncology

## 2022-09-28 ENCOUNTER — Inpatient Hospital Stay (HOSPITAL_BASED_OUTPATIENT_CLINIC_OR_DEPARTMENT_OTHER): Payer: Commercial Managed Care - HMO | Admitting: Oncology

## 2022-09-28 VITALS — BP 120/69 | HR 93 | Resp 18

## 2022-09-28 DIAGNOSIS — R630 Anorexia: Secondary | ICD-10-CM | POA: Diagnosis not present

## 2022-09-28 DIAGNOSIS — C251 Malignant neoplasm of body of pancreas: Secondary | ICD-10-CM

## 2022-09-28 DIAGNOSIS — Z800 Family history of malignant neoplasm of digestive organs: Secondary | ICD-10-CM | POA: Diagnosis not present

## 2022-09-28 DIAGNOSIS — Z79899 Other long term (current) drug therapy: Secondary | ICD-10-CM | POA: Diagnosis not present

## 2022-09-28 DIAGNOSIS — Z801 Family history of malignant neoplasm of trachea, bronchus and lung: Secondary | ICD-10-CM | POA: Diagnosis not present

## 2022-09-28 DIAGNOSIS — I10 Essential (primary) hypertension: Secondary | ICD-10-CM | POA: Diagnosis not present

## 2022-09-28 DIAGNOSIS — E876 Hypokalemia: Secondary | ICD-10-CM

## 2022-09-28 DIAGNOSIS — N950 Postmenopausal bleeding: Secondary | ICD-10-CM | POA: Diagnosis not present

## 2022-09-28 DIAGNOSIS — Z5111 Encounter for antineoplastic chemotherapy: Secondary | ICD-10-CM | POA: Diagnosis present

## 2022-09-28 DIAGNOSIS — Z8719 Personal history of other diseases of the digestive system: Secondary | ICD-10-CM | POA: Diagnosis not present

## 2022-09-28 LAB — CBC WITH DIFFERENTIAL (CANCER CENTER ONLY)
Abs Immature Granulocytes: 0.47 10*3/uL — ABNORMAL HIGH (ref 0.00–0.07)
Basophils Absolute: 0 10*3/uL (ref 0.0–0.1)
Basophils Relative: 0 %
Eosinophils Absolute: 0 10*3/uL (ref 0.0–0.5)
Eosinophils Relative: 0 %
HCT: 34.9 % — ABNORMAL LOW (ref 36.0–46.0)
Hemoglobin: 11.7 g/dL — ABNORMAL LOW (ref 12.0–15.0)
Immature Granulocytes: 5 %
Lymphocytes Relative: 32 %
Lymphs Abs: 2.9 10*3/uL (ref 0.7–4.0)
MCH: 30 pg (ref 26.0–34.0)
MCHC: 33.5 g/dL (ref 30.0–36.0)
MCV: 89.5 fL (ref 80.0–100.0)
Monocytes Absolute: 1.6 10*3/uL — ABNORMAL HIGH (ref 0.1–1.0)
Monocytes Relative: 17 %
Neutro Abs: 4.1 10*3/uL (ref 1.7–7.7)
Neutrophils Relative %: 46 %
Platelet Count: 322 10*3/uL (ref 150–400)
RBC: 3.9 MIL/uL (ref 3.87–5.11)
RDW: 20.3 % — ABNORMAL HIGH (ref 11.5–15.5)
WBC Count: 9.1 10*3/uL (ref 4.0–10.5)
nRBC: 0.7 % — ABNORMAL HIGH (ref 0.0–0.2)

## 2022-09-28 LAB — CMP (CANCER CENTER ONLY)
ALT: 59 U/L — ABNORMAL HIGH (ref 0–44)
AST: 47 U/L — ABNORMAL HIGH (ref 15–41)
Albumin: 3.4 g/dL — ABNORMAL LOW (ref 3.5–5.0)
Alkaline Phosphatase: 82 U/L (ref 38–126)
Anion gap: 9 (ref 5–15)
BUN: 20 mg/dL (ref 6–20)
CO2: 35 mmol/L — ABNORMAL HIGH (ref 22–32)
Calcium: 9.5 mg/dL (ref 8.9–10.3)
Chloride: 96 mmol/L — ABNORMAL LOW (ref 98–111)
Creatinine: 0.65 mg/dL (ref 0.44–1.00)
GFR, Estimated: >60 mL/min (ref >60)
Glucose, Bld: 189 mg/dL — ABNORMAL HIGH (ref 70–99)
Potassium: 2.7 mmol/L — CL (ref 3.5–5.1)
Sodium: 140 mmol/L (ref 135–145)
Total Bilirubin: 1.6 mg/dL — ABNORMAL HIGH (ref 0.3–1.2)
Total Protein: 6.5 g/dL (ref 6.5–8.1)

## 2022-09-28 MED ORDER — SODIUM CHLORIDE 0.9 % IV SOLN: 2000.0000 mg | Freq: Once | INTRAVENOUS | Status: DC

## 2022-09-28 MED ORDER — HEPARIN SOD (PORK) LOCK FLUSH 100 UNIT/ML IV SOLN
500.0000 [IU] | Freq: Once | INTRAVENOUS | Status: AC | PRN
  Administered 2022-09-28: 500 [IU]

## 2022-09-28 MED ORDER — POTASSIUM CHLORIDE CRYS ER 20 MEQ PO TBCR
40.0000 meq | EXTENDED_RELEASE_TABLET | Freq: Once | ORAL | Status: AC
  Administered 2022-09-28: 40 meq via ORAL
  Filled 2022-09-28: qty 2

## 2022-09-28 MED ORDER — PROCHLORPERAZINE MALEATE 10 MG PO TABS
10.0000 mg | ORAL_TABLET | Freq: Once | ORAL | Status: AC
  Administered 2022-09-28: 10 mg via ORAL
  Filled 2022-09-28: qty 1

## 2022-09-28 MED ORDER — SODIUM CHLORIDE 0.9% FLUSH
10.0000 mL | INTRAVENOUS | Status: DC | PRN
  Administered 2022-09-28: 10 mL

## 2022-09-28 MED ORDER — SODIUM CHLORIDE 0.9 % IV SOLN: Freq: Once | INTRAVENOUS | Status: AC

## 2022-09-28 MED ORDER — PACLITAXEL PROTEIN-BOUND CHEMO INJECTION 100 MG
100.0000 mg/m2 | Freq: Once | INTRAVENOUS | Status: AC
  Administered 2022-09-28: 175 mg via INTRAVENOUS
  Filled 2022-09-28: qty 35

## 2022-09-28 MED ORDER — SODIUM CHLORIDE 0.9 % IV SOLN
1000.0000 mg/m2 | Freq: Once | INTRAVENOUS | Status: AC
  Administered 2022-09-28: 1787 mg via INTRAVENOUS
  Filled 2022-09-28: qty 21.04

## 2022-09-28 MED ORDER — PACLITAXEL PROTEIN-BOUND CHEMO INJECTION 100 MG: 100.0000 mg/m2 | Freq: Once | INTRAVENOUS | Status: DC

## 2022-09-28 NOTE — Progress Notes (Signed)
[facility] OFFICE PROGRESS NOTE   Diagnosis: Pancreas cancer  INTERVAL HISTORY:   Kathleen Stone returns as scheduled.  She completed another treatment of gemcitabine/Abraxane on 09/14/2022.  No nausea, fever, rash, or neuropathy symptoms.  She continues to have anorexia and altered taste.  Abdominal pain has improved.  She does not take oxycodone on a consistent basis.  No diarrhea.  She is taking only Protonix and prednisone.  Objective:  Vital signs in last 24 hours:  Blood pressure 126/70, pulse (!) 115, temperature 98.2 F (36.8 C), temperature source Tympanic, resp. rate 20, height '5\' 6"'$  (1.676 m), weight 153 lb 3.2 oz (69.5 kg), last menstrual period 02/07/2016, SpO2 100 %.    HEENT: Thrush or ulcers Resp: Lungs clear bilaterally Cardio: Regular rate and rhythm GI: Mild tenderness in the upper abdomen, no mass, no hepatosplenomegaly Vascular: No leg edema  Skin: Diffuse dryness and flaking of the skin  Portacath/PICC-without erythema  Lab Results:  Lab Results  Component Value Date   WBC 9.1 09/28/2022   HGB 11.7 (L) 09/28/2022   HCT 34.9 (L) 09/28/2022   MCV 89.5 09/28/2022   PLT 322 09/28/2022   NEUTROABS 4.1 09/28/2022    CMP  Lab Results  Component Value Date   NA 144 09/14/2022   K 3.1 (L) 09/14/2022   CL 102 09/14/2022   CO2 32 09/14/2022   GLUCOSE 127 (H) 09/14/2022   BUN 12 09/14/2022   CREATININE 0.71 09/14/2022   CALCIUM 9.5 09/14/2022   PROT 6.8 09/14/2022   ALBUMIN 3.6 09/14/2022   AST 56 (H) 09/14/2022   ALT 70 (H) 09/14/2022   ALKPHOS 79 09/14/2022   BILITOT 2.2 (H) 09/14/2022   GFRNONAA >60 09/14/2022   GFRAA >60 02/29/2020    Lab Results  Component Value Date   CEA1 27.4 (H) 07/31/2022   NVB166 409 (H) 09/14/2022     Medications: I have reviewed the patient's current medications.   Assessment/Plan:  Pancreas cancer-T4 N1 CT abdomen/pelvis 07/30/2022-body/neck pancreas mass with peripheral pancreatic atrophy  and duct dilation, suspected encasement of the celiac axis, uterine fibroids MRI abdomen 07/30/2022-3.7 x 4.7 cm pancreas body/tail mass with encasement of the proximal celiac artery, occlusion with collateralization at the confluence of the SMV and portal vein, no evidence of metastatic disease CT chest 08/01/2022-no intrathoracic metastases Elevated CA 19-9 EUS 08/02/2022-gastric polyp, gastric erosions, 32 x 33 mm pancreas body mass, invasion of the celiac trunk, invasion of the splenoportal confluence, no SMA invasion, 2 malignant appearing nodes in the porta hepatis,T4N1 by endoscopic criteria, FNA biopsy-adenocarcinoma 08/17/2022 Guardant360-tumor mutation burden 0.96; MSI high not detected; variant of uncertain clinical significance NTRK3 T93M Cycle 1 gemcitabine/Abraxane 08/17/2022 Cycle 2 gemcitabine/Abraxane 08/31/2022 Cycle 3 gemcitabine/Abraxane 09/14/2022 Cycle 4 gemcitabine/Abraxane 09/28/2022   2.  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  Anorexia/weight loss secondary to #1 4.  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  Abdominal pain 6.  History of colon polyps 7.  Family history of multiple cancers Pancreas cancer maternal aunt, maternal cousin, maternal grandfather Breast cancer-maternal aunt, maternal cousin Prostate cancer-maternal uncle Lung cancer-father 5.  Hypertension 9.  Postmenopausal vaginal bleeding 10.  H. pylori 08/02/2022-amoxicillin and Flagyl discontinued 08/21/2022 due to GI symptoms     Disposition: Ms. Footman appears stable.  She will complete another cycle of gemcitabine/Abraxane today.  We will follow-up on the CA 19-9 from today.  She will return for an office visit and chemotherapy in  2 weeks.  She will be referred for a restaging CT abdomen/pelvis after cycle 5.  I encouraged her to increase her intake of food in addition to the 3-4 cans of boost/Ensure per day.  Betsy Coder, MD  09/28/2022  9:46 AM

## 2022-09-28 NOTE — Patient Instructions (Signed)
[facility]   Discharge Instructions: Thank you for choosing [facility] to provide your oncology and hematology care.   If you have a lab appointment with the [facility], please go directly to the [facility] and check in at the registration area.   Wear comfortable clothing and clothing appropriate for easy access to any Portacath or PICC line.   We strive to give you quality time with your provider. You may need to reschedule your appointment if you arrive late (15 or more minutes).  Arriving late affects you and other patients whose appointments are after yours.  Also, if you miss three or more appointments without notifying the office, you may be dismissed from the clinic at the provider's discretion.      For prescription refill requests, have your pharmacy contact our office and allow 72 hours for refills to be completed.    Today you received the following chemotherapy and/or immunotherapy agents Paclitaxel-protein bound (ABRAXANE) & Gemcitabine (GEMZAR).      To help prevent nausea and vomiting after your treatment, we encourage you to take your nausea medication as directed.  BELOW ARE SYMPTOMS THAT SHOULD BE REPORTED IMMEDIATELY: *FEVER GREATER THAN 100.4 F (38 C) OR HIGHER *CHILLS OR SWEATING *NAUSEA AND VOMITING THAT IS NOT CONTROLLED WITH YOUR NAUSEA MEDICATION *UNUSUAL SHORTNESS OF BREATH *UNUSUAL BRUISING OR BLEEDING *URINARY PROBLEMS (pain or burning when urinating, or frequent urination) *BOWEL PROBLEMS (unusual diarrhea, constipation, pain near the anus) TENDERNESS IN MOUTH AND THROAT WITH OR WITHOUT PRESENCE OF ULCERS (sore throat, sores in mouth, or a toothache) UNUSUAL RASH, SWELLING OR PAIN  UNUSUAL VAGINAL DISCHARGE OR ITCHING   Items with * indicate a potential emergency and should be followed up as soon as possible or go to the Emergency Department if any problems should occur.  Please show the CHEMOTHERAPY ALERT  CARD or IMMUNOTHERAPY ALERT CARD at check-in to the Emergency Department and triage nurse.  Should you have questions after your visit or need to cancel or reschedule your appointment, please contact [facility]  Dept: [phone]  and follow the prompts.  Office hours are 8:00 a.m. to 4:30 p.m. Monday - Friday. Please note that voicemails left after 4:00 p.m. may not be returned until the following business day.  We are closed weekends and major holidays. You have access to a nurse at all times for urgent questions. Please call the main number to the clinic Dept: [phone] and follow the prompts.   For any non-urgent questions, you may also contact your provider using MyChart. We now offer e-Visits for anyone 48 and older to request care online for non-urgent symptoms. For details visit [email or web address].   Also download the MyChart app! Go to the app store, search "MyChart", open the app, select [facility], and log in with your MyChart username and password.  Paclitaxel Nanoparticle Albumin-Bound Injection What is this medication? NANOPARTICLE ALBUMIN-BOUND PACLITAXEL (Na no PAHR ti kuhl al BYOO muhn-bound PAK li TAX el) treats some types of cancer. It works by slowing down the growth of cancer cells. This medicine may be used for other purposes; ask your health care provider or pharmacist if you have questions. COMMON BRAND NAME(S): Abraxane What should I tell my care team before I take this medication? They need to know if you have any of these conditions: Liver disease Low white blood cell levels An unusual or allergic reaction to paclitaxel, albumin, other medications, foods, dyes, or  preservatives If you or your partner are pregnant or trying to get pregnant Breast-feeding How should I use this medication? This medication is injected into a vein. It is given by your care team in a hospital or clinic setting. Talk to your care team about the use of  this medication in children. Special care may be needed. Overdosage: If you think you have taken too much of this medicine contact a poison control center or emergency room at once. NOTE: This medicine is only for you. Do not share this medicine with others. What if I miss a dose? Keep appointments for follow-up doses. It is important not to miss your dose. Call your care team if you are unable to keep an appointment. What may interact with this medication? Other medications may affect the way this medication works. Talk with your care team about all of the medications you take. They may suggest changes to your treatment plan to lower the risk of side effects and to make sure your medications work as intended. This list may not describe all possible interactions. Give your health care provider a list of all the medicines, herbs, non-prescription drugs, or dietary supplements you use. Also tell them if you smoke, drink alcohol, or use illegal drugs. Some items may interact with your medicine. What should I watch for while using this medication? Your condition will be monitored carefully while you are receiving this medication. You may need blood work while taking this medication. This medication may make you feel generally unwell. This is not uncommon as chemotherapy can affect healthy cells as well as cancer cells. Report any side effects. Continue your course of treatment even though you feel ill unless your care team tells you to stop. This medication can cause serious allergic reactions. To reduce the risk, your care team may give you other medications to take before receiving this one. Be sure to follow the directions from your care team. This medication may increase your risk of getting an infection. Call your care team for advice if you get a fever, chills, sore throat, or other symptoms of a cold or flu. Do not treat yourself. Try to avoid being around people who are sick. This medication may  increase your risk to bruise or bleed. Call your care team if you notice any unusual bleeding. Be careful brushing or flossing your teeth or using a toothpick because you may get an infection or bleed more easily. If you have any dental work done, tell your dentist you are receiving this medication. Talk to your care team if you or your partner may be pregnant. Serious birth defects can occur if you take this medication during pregnancy and for 6 months after the last dose. You will need a negative pregnancy test before starting this medication. Contraception is recommended while taking this medication and for 6 months after the last dose. Your care team can help you find the option that works for you. If your partner can get pregnant, use a condom during sex while taking this medication and for 3 months after the last dose. Do not breastfeed while taking this medication and for 2 weeks after the last dose. This medication may cause infertility. Talk to your care team if you are concerned about your fertility. What side effects may I notice from receiving this medication? Side effects that you should report to your care team as soon as possible: Allergic reactions--skin rash, itching, hives, swelling of the face, lips, tongue, or throat Dry  cough, shortness of breath or trouble breathing Infection--fever, chills, cough, sore throat, wounds that don't heal, pain or trouble when passing urine, general feeling of discomfort or being unwell Low red blood cell level--unusual weakness or fatigue, dizziness, headache, trouble breathing Pain, tingling, or numbness in the hands or feet Stomach pain, unusual weakness or fatigue, nausea, vomiting, diarrhea, or fever that lasts longer than expected Unusual bruising or bleeding Side effects that usually do not require medical attention (report to your care team if they continue or are bothersome): Diarrhea Fatigue Hair loss Loss of  appetite Nausea Vomiting This list may not describe all possible side effects. Call your doctor for medical advice about side effects. You may report side effects to FDA at 1-800-FDA-1088. Where should I keep my medication? This medication is given in a hospital or clinic. It will not be stored at home. NOTE: This sheet is a summary. It may not cover all possible information. If you have questions about this medicine, talk to your doctor, pharmacist, or health care provider.  2023 Elsevier/Gold Standard (2007-10-25 00:00:00)   Gemcitabine Injection What is this medication? GEMCITABINE (jem SYE ta been) treats some types of cancer. It works by slowing down the growth of cancer cells. This medicine may be used for other purposes; ask your health care provider or pharmacist if you have questions. COMMON BRAND NAME(S): Gemzar, Infugem What should I tell my care team before I take this medication? They need to know if you have any of these conditions: Blood disorders Infection Kidney disease Liver disease Lung or breathing disease, such as asthma or COPD Recent or ongoing radiation therapy An unusual or allergic reaction to gemcitabine, other medications, foods, dyes, or preservatives If you or your partner are pregnant or trying to get pregnant Breast-feeding How should I use this medication? This medication is injected into a vein. It is given by your care team in a hospital or clinic setting. Talk to your care team about the use of this medication in children. Special care may be needed. Overdosage: If you think you have taken too much of this medicine contact a poison control center or emergency room at once. NOTE: This medicine is only for you. Do not share this medicine with others. What if I miss a dose? Keep appointments for follow-up doses. It is important not to miss your dose. Call your care team if you are unable to keep an appointment. What may interact with this  medication? Interactions have not been studied. This list may not describe all possible interactions. Give your health care provider a list of all the medicines, herbs, non-prescription drugs, or dietary supplements you use. Also tell them if you smoke, drink alcohol, or use illegal drugs. Some items may interact with your medicine. What should I watch for while using this medication? Your condition will be monitored carefully while you are receiving this medication. This medication may make you feel generally unwell. This is not uncommon, as chemotherapy can affect healthy cells as well as cancer cells. Report any side effects. Continue your course of treatment even though you feel ill unless your care team tells you to stop. In some cases, you may be given additional medications to help with side effects. Follow all directions for their use. This medication may increase your risk of getting an infection. Call your care team for advice if you get a fever, chills, sore throat, or other symptoms of a cold or flu. Do not treat yourself. Try to avoid  being around people who are sick. This medication may increase your risk to bruise or bleed. Call your care team if you notice any unusual bleeding. Be careful brushing or flossing your teeth or using a toothpick because you may get an infection or bleed more easily. If you have any dental work done, tell your dentist you are receiving this medication. Avoid taking medications that contain aspirin, acetaminophen, ibuprofen, naproxen, or ketoprofen unless instructed by your care team. These medications may hide a fever. Talk to your care team if you or your partner wish to become pregnant or think you might be pregnant. This medication can cause serious birth defects if taken during pregnancy and for 6 months after the last dose. A negative pregnancy test is required before starting this medication. A reliable form of contraception is recommended while taking  this medication and for 6 months after the last dose. Talk to your care team about effective forms of contraception. Do not father a child while taking this medication and for 3 months after the last dose. Use a condom while having sex during this time period. Do not breastfeed while taking this medication and for at least 1 week after the last dose. This medication may cause infertility. Talk to your care team if you are concerned about your fertility. What side effects may I notice from receiving this medication? Side effects that you should report to your care team as soon as possible: Allergic reactions--skin rash, itching, hives, swelling of the face, lips, tongue, or throat Capillary leak syndrome--stomach or muscle pain, unusual weakness or fatigue, feeling faint or lightheaded, decrease in the amount of urine, swelling of the ankles, hands, or feet, trouble breathing Infection--fever, chills, cough, sore throat, wounds that don't heal, pain or trouble when passing urine, general feeling of discomfort or being unwell Liver injury--right upper belly pain, loss of appetite, nausea, light-colored stool, dark yellow or brown urine, yellowing skin or eyes, unusual weakness or fatigue Low red blood cell level--unusual weakness or fatigue, dizziness, headache, trouble breathing Lung injury--shortness of breath or trouble breathing, cough, spitting up blood, chest pain, fever Stomach pain, bloody diarrhea, pale skin, unusual weakness or fatigue, decrease in the amount of urine, which may be signs of hemolytic uremic syndrome Sudden and severe headache, confusion, change in vision, seizures, which may be signs of posterior reversible encephalopathy syndrome (PRES) Unusual bruising or bleeding Side effects that usually do not require medical attention (report to your care team if they continue or are bothersome): Diarrhea Drowsiness Hair loss Nausea Pain, redness, or swelling with sores inside the  mouth or throat Vomiting This list may not describe all possible side effects. Call your doctor for medical advice about side effects. You may report side effects to FDA at 1-800-FDA-1088. Where should I keep my medication? This medication is given in a hospital or clinic. It will not be stored at home. NOTE: This sheet is a summary. It may not cover all possible information. If you have questions about this medicine, talk to your doctor, pharmacist, or health care provider.  2023 Elsevier/Gold Standard (2007-10-25 00:00:00)

## 2022-09-28 NOTE — Progress Notes (Signed)
Lab called with critical K level of 2.7.  Dr. Benay Spice made aware.  Per Dr. Benay Spice, orders entered for patient to receive PO potassium today during visit (see MAR). Patient is to then take 20 mEq twice daily starting tomorrow (09/29/22).  Patient informed of K level and Dr. Gearldine Shown response. Patient states she has not been taking her K at home.  Educated patient on importance of taking home PO K.  Patient verbalized understanding.    Patient seen by Dr. Benay Spice today  Vitals are NOT within treatment parameters, HR 103.   Labs reviewed by Dr. Benay Spice and are NOT within treatment parameters. Patient's total bilirubin was 1.6. Patient's K was 2.7. Order entered for 40 mEq PO during her infusion today.   Per physician team, patient is ok to proceed with treatment and there are NO modifications to the treatment plan.

## 2022-09-28 NOTE — Telephone Encounter (Signed)
Pt was referred by Creola Corn. Duffy, MSW, LCSW Clinical Education officer, museum for possible assistance of the [employer].  I spoke w her today and she is going to provide a letter of support on her next appt.

## 2022-09-28 NOTE — Progress Notes (Signed)
Per Dr Benay Spice, adj chemo doses for today's weight.  Kennith Center, Pharm.D., CPP 09/28/2022'@11'$ :08 AM

## 2022-10-02 LAB — CANCER ANTIGEN 19-9: CA 19-9: 297 U/mL — ABNORMAL HIGH (ref 0–35)

## 2022-10-03 ENCOUNTER — Other Ambulatory Visit: Payer: Self-pay

## 2022-10-04 ENCOUNTER — Ambulatory Visit: Payer: Self-pay

## 2022-10-05 ENCOUNTER — Ambulatory Visit (INDEPENDENT_AMBULATORY_CARE_PROVIDER_SITE_OTHER): Payer: Commercial Managed Care - HMO | Admitting: Internal Medicine

## 2022-10-05 ENCOUNTER — Other Ambulatory Visit: Payer: Self-pay

## 2022-10-05 ENCOUNTER — Encounter: Payer: Self-pay | Admitting: Internal Medicine

## 2022-10-05 DIAGNOSIS — E43 Unspecified severe protein-calorie malnutrition: Secondary | ICD-10-CM

## 2022-10-05 DIAGNOSIS — K8689 Other specified diseases of pancreas: Secondary | ICD-10-CM

## 2022-10-05 NOTE — Patient Outreach (Signed)
  Care Coordination   Follow Up Visit Note   10/05/2022 Late Entry Name: Kathleen Stone MRN: [id number] DOB: 03/12/1966  Kathleen Stone is a 57 y.o. year old female who sees Masters, Joellen Jersey, DO for primary care. I spoke with  Kathleen Stone by phone today.  What matters to the patients health and wellness today?  I have no energy    Goals Addressed             This Visit's Progress    I don't understand why I am losing weight.       Care Coordination Interventions:   Evaluation of current treatment plan and patient's adherence to plan as established by provider Reviewed medications with patient and discussed  Active listening / Reflection utilized  Emotional Support Provided Problem [place] strategies reviewed Verbalization of feelings encouraged  Get help from Social work at [employer] with paper work I had a conversation with Ms. Miller, who told me that she is doing well. She shared that she has been granted food stamps, but is still waiting to hear about her disability status. Ms. Whatley is feeling exhausted and not motivated to do much, but five days before she has her next treatment, her energy returns. I advised her to take it easy and not to push herself too hard. Unfortunately, she has lost her sense of taste, making it hard to eat anything, but she is keeping herself hydrated by drinking protein shakes and other fluids. During our conversation, Ms. Buckner mentioned that she wants to have a rollator when she is ambulating and a handicap placard. I have already reached out to the office, and they will be contacting Ms. Willden to start the process.        SDOH assessments and interventions completed:  No     Care Coordination Interventions:  Yes, provided   Follow up plan: Follow up call scheduled for 10/18/22 915 am    Encounter Outcome:  Pt. Visit Completed   Lazaro Arms RN, BSN, [facility]   Phone: [phone]

## 2022-10-05 NOTE — Patient Instructions (Addendum)
Visit Information  Thank you for taking time to visit with me today. Please don't hesitate to contact me if I can be of assistance to you.   Following are the goals we discussed today:   Goals Addressed             This Visit's Progress    I don't understand why I am losing weight.       Care Coordination Interventions:   Evaluation of current treatment plan and patient's adherence to plan as established by provider Reviewed medications with patient and discussed  Active listening / Reflection utilized  Emotional Support Provided Problem [place] strategies reviewed Verbalization of feelings encouraged  Get help from Social work at [employer] with paper work I had a conversation with Ms. Meininger, who told me that she is doing well. She shared that she has been granted food stamps, but is still waiting to hear about her disability status. Ms. Remsen is feeling exhausted and not motivated to do much, but five days before she has her next treatment, her energy returns. I advised her to take it easy and not to push herself too hard. Unfortunately, she has lost her sense of taste, making it hard to eat anything, but she is keeping herself hydrated by drinking protein shakes and other fluids. During our conversation, Ms. Philbin mentioned that she wants to have a rollator when she is ambulating and a handicap placard. I have already reached out to the office, and they will be contacting Ms. Miu to start the process.        Our next appointment is by telephone on 10/18/22 at 915 am  Please call the care guide team at [phone] if you need to cancel or reschedule your appointment.   If you are experiencing a Mental Health or [place] or need someone to talk to, please call 1-800-273-TALK (toll free, 24 hour hotline)  Patient verbalizes understanding of instructions and care plan provided today and agrees to view in [facility]. Active MyChart status and patient understanding  of how to access instructions and care plan via MyChart confirmed with patient.     Lazaro Arms RN, BSN, [facility]   Phone: [phone]

## 2022-10-05 NOTE — Assessment & Plan Note (Signed)
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 She has lost a significant amount of weight and does not eat very well. Because of this, she is experiencing generalized weakness and low energy. She has been attempting to use 4-legged walker and cane as able, however, these assistive devices are not working well for her. The cane does not provide the support nor stability she requires since she is so weak she feels unsteady at times. Additionally, the 4-legged walker is too difficult for her to use since she fatigues so easily. She even needed to use a wheelchair because regular walker exhausted her. She thinks a rollator will provide the best support for her without requiring her to haul it around the way a standard walker does. Lastly, due to her weight loss and deconditioning, she experiences shortness of breath when having to walk distances. For this reason, she is requesting a handicap placard.   Patient with pancreatic mass undergoing chemotherapy treatment for such. Prior OV demonstrate weight loss and she is experiencing malnutrition with subsequent deconditioning which puts her at risk for falls and deterioration. Will place order for rollator and start paperwork for handicap placard

## 2022-10-05 NOTE — Progress Notes (Signed)
[facility] Internal Medicine Residency Telephone Encounter Continuity Care Appointment  HPI:  This telephone encounter was created for [name] on 10/05/2022 for the following purpose/cc DME. 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 She has lost a significant amount of weight and does not eat very well. Because of this, she is experiencing generalized weakness and low energy. She has been attempting to use 4-legged walker and cane as able, however, these assistive devices are not working well for her. The cane does not provide the support nor stability she requires since she is so weak she feels unsteady at times. Additionally, the 4-legged walker is too difficult for her to use since she fatigues so easily. She even needed to use a wheelchair because regular walker exhausted her. She thinks a rollator will provide the best support for her without requiring her to haul it around the way a standard walker does. Lastly, due to her weight loss and deconditioning, she experiences shortness of breath when having to walk distances. For this reason, she is requesting a handicap placard.    Past Medical History:  Past Medical History:  Diagnosis Date   Brain tumor (benign) (HCC)    Chronic lower back pain    Negative MRI of hips bilaterally, neg CT scan of A and P; MRI of T and L spine-> very mild deg changes from T4-5 and T7-8, cervical spondylotic changes at C5-6, very mild disc bulge at L2-3 without spinal stenosis, nl ESR, ANA, RF, HIV, TSH, and B12   DJD (degenerative joint disease) of cervical spine    Family history of adverse reaction to anesthesia    "sisters PONV"   Galactorrhea of both breasts 2009   R. sided w/ benign papilloma excised in 4/09. L sided in 5/09   Gallstones    Hyperlipidemia    Hypertension    Migraine    "monthly" (11/15/2016)   Neuromuscular disorder (HCC)    left arm and side of face    Neuropathy     Thought to be 2/2 nerve compression in the C-spine   Obesity    Personal history of adenomatous colonic polyp    09/2007 - diminutive adenoma ([facility])   PONV (postoperative nausea and vomiting)    Stroke ([place])    2021- brain tumor bx caused ? stroke-    Tobacco abuse      ROS:     Assessment / Plan / Recommendations:  Please see A&P under problem oriented charting for assessment of the patient's acute and chronic medical conditions.  Patient with pancreatic mass undergoing chemotherapy treatment for such. Prior OV demonstrate weight loss and she is at risk for malnutrition with subsequent deconditioning which puts her at risk for falls and deterioration. Will place order for rollator and start paperwork for handicap placard.  As always, pt is advised that if symptoms worsen or new symptoms arise, they should go to an urgent care facility or to to ER for further evaluation.   Consent and Medical Decision Making:  Patient discussed with Dr. Jimmye Norman This is a telephone encounter between Kathleen Stone and Delene Ruffini on 10/05/2022 for dme rollator. The visit was conducted with the patient located at home and Delene Ruffini at [facility]. The patient's identity was confirmed using their DOB and current address. The patient has consented to being evaluated through a telephone encounter and understands the associated risks (an examination cannot be done and the patient may need to come  in for an appointment) / benefits (allows the patient to remain at home, decreasing exposure to coronavirus). I personally spent 15 minutes on medical discussion.

## 2022-10-06 ENCOUNTER — Other Ambulatory Visit: Payer: Self-pay | Admitting: Oncology

## 2022-10-08 ENCOUNTER — Inpatient Hospital Stay: Payer: Commercial Managed Care - HMO

## 2022-10-08 ENCOUNTER — Other Ambulatory Visit: Payer: Self-pay

## 2022-10-08 ENCOUNTER — Encounter: Payer: Self-pay | Admitting: Genetic Counselor

## 2022-10-08 ENCOUNTER — Inpatient Hospital Stay (HOSPITAL_BASED_OUTPATIENT_CLINIC_OR_DEPARTMENT_OTHER): Payer: Commercial Managed Care - HMO | Admitting: Genetic Counselor

## 2022-10-08 DIAGNOSIS — Z1379 Encounter for other screening for genetic and chromosomal anomalies: Secondary | ICD-10-CM | POA: Diagnosis not present

## 2022-10-08 DIAGNOSIS — Z8042 Family history of malignant neoplasm of prostate: Secondary | ICD-10-CM

## 2022-10-08 DIAGNOSIS — C251 Malignant neoplasm of body of pancreas: Secondary | ICD-10-CM

## 2022-10-08 DIAGNOSIS — Z800 Family history of malignant neoplasm of digestive organs: Secondary | ICD-10-CM

## 2022-10-08 DIAGNOSIS — Z803 Family history of malignant neoplasm of breast: Secondary | ICD-10-CM

## 2022-10-08 NOTE — Progress Notes (Signed)
REFERRING PROVIDER: Ladell Pier, MD [place],  [place] [address]  PRIMARY PROVIDER:  Masters, Joellen Jersey, DO  PRIMARY REASON FOR VISIT:  1. Genetic testing   2. Malignant neoplasm of body of pancreas ([place])   3. Family history of pancreatic cancer   4. Family history of breast cancer   5. Family history of prostate cancer     HISTORY OF PRESENT ILLNESS:   Kathleen Stone, a 57 y.o. female, was seen for a [place] cancer genetics consultation at the request of Dr. Benay Spice due to a personal and family history of pancreatic cancer, to discuss her negative genetic testing results, and further clarify potential cancer risks for family members.   In November 2023, Kathleen Stone was diagnosed with adenocarcinoma of the pancreas.  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   CANCER HISTORY:  Oncology History  Pancreas cancer ([place])  08/03/2022 Initial Diagnosis   Overlapping malignant neoplasm of pancreas ([place])   08/08/2022 Cancer Staging   Staging form: Exocrine Pancreas, AJCC 8th Edition - Clinical: Stage III (cT4, cN1, cM0) - Signed by Ladell Pier, MD on 08/08/2022   08/17/2022 -  Chemotherapy   Patient is on Treatment Plan : PANCREATIC Abraxane D1,8,15 + Gemcitabine D1,8,15 q28d      Genetic Testing   [facility] CancerNext-Expanded Panel+RNA was Negative. Report date is 08/27/2022.  The CancerNext-Expanded gene panel offered by [facility] and includes sequencing, rearrangement, and RNA analysis for the following 77 genes: AIP, ALK, APC, ATM, AXIN2, BAP1, BARD1, BLM, BMPR1A, BRCA1, BRCA2, BRIP1, CDC73, CDH1, CDK4, CDKN1B, CDKN2A, CHEK2, CTNNA1, DICER1, FANCC, FH, FLCN, GALNT12, KIF1B, LZTR1, MAX, MEN1, MET, MLH1, MSH2, MSH3, MSH6, MUTYH, NBN, NF1, NF2, NTHL1, PALB2, PHOX2B, PMS2, POT1, PRKAR1A, PTCH1, PTEN, RAD51C, RAD51D, RB1, RECQL, RET, SDHA, SDHAF2, SDHB, SDHC, SDHD, SMAD4, SMARCA4, SMARCB1, SMARCE1, STK11, SUFU, TMEM127, TP53, TSC1,  TSC2, VHL and XRCC2 (sequencing and deletion/duplication); EGFR, EGLN1, HOXB13, KIT, MITF, PDGFRA, POLD1, and POLE (sequencing only); EPCAM and GREM1 (deletion/duplication only).        Past Medical History:  Diagnosis Date   Brain tumor (benign) (HCC)    Chronic lower back pain    Negative MRI of hips bilaterally, neg CT scan of A and P; MRI of T and L spine-> very mild deg changes from T4-5 and T7-8, cervical spondylotic changes at C5-6, very mild disc bulge at L2-3 without spinal stenosis, nl ESR, ANA, RF, HIV, TSH, and B12   DJD (degenerative joint disease) of cervical spine    Family history of adverse reaction to anesthesia    "sisters PONV"   Galactorrhea of both breasts 2009   R. sided w/ benign papilloma excised in 4/09. L sided in 5/09   Gallstones    Hyperlipidemia    Hypertension    Migraine    "monthly" (11/15/2016)   Neuromuscular disorder ([place])    left arm and side of face    Neuropathy    Thought to be 2/2 nerve compression in the C-spine   Obesity    Personal history of adenomatous colonic polyp    09/2007 - diminutive adenoma ([facility])   PONV (postoperative nausea and vomiting)    Stroke ([place])    2021- brain tumor bx caused ? stroke-    Tobacco abuse     Past Surgical History:  Procedure Laterality Date   APPENDECTOMY     BIOPSY  08/02/2022   Procedure: BIOPSY;  Surgeon: Irving Copas., MD;  Location: [facility];  Service: Gastroenterology;;   BRAIN BIOPSY     [phone]   BREAST SURGERY Right 12/2007   central duct excision   CHOLECYSTECTOMY N/A 12/08/2019   Procedure: LAPAROSCOPIC CHOLECYSTECTOMY;  Surgeon: Ralene Ok, MD;  Location: [facility];  Service: General;  Laterality: N/A;   COLONOSCOPY  2009   COLONOSCOPY  09/14/2020   COLONOSCOPY W/ BIOPSIES AND POLYPECTOMY  09/2007   Dr. [name]. 2 small adenomatous polyps, internal hemorrhoids-Grade 1. Rec  routine colonoscopy at age 30.   COLONOSCOPY W/ BIOPSIES AND POLYPECTOMY      ESOPHAGOGASTRODUODENOSCOPY (EGD) WITH PROPOFOL N/A 08/02/2022   Procedure: ESOPHAGOGASTRODUODENOSCOPY (EGD) WITH PROPOFOL;  Surgeon: Rush Landmark Telford Nab., MD;  Location: [facility];  Service: Gastroenterology;  Laterality: N/A;   EUS N/A 08/02/2022   Procedure: FULL UPPER ENDOSCOPIC ULTRASOUND (EUS) RADIAL;  Surgeon: Rush Landmark Telford Nab., MD;  Location: [facility];  Service: Gastroenterology;  Laterality: N/A;   FINE NEEDLE ASPIRATION N/A 08/02/2022   Procedure: FINE NEEDLE ASPIRATION (FNA) LINEAR;  Surgeon: Irving Copas., MD;  Location: [facility];  Service: Gastroenterology;  Laterality: N/A;   IR IMAGING GUIDED PORT INSERTION  08/03/2022   NASAL SEPTOPLASTY W/ TURBINOPLASTY Bilateral 04/2008   Septoplasty with bilateral inferior turbinate reduction; Dr. Lucia Gaskins   POLYPECTOMY  08/02/2022   Procedure: POLYPECTOMY;  Surgeon: Rush Landmark Telford Nab., MD;  Location: [facility];  Service: Gastroenterology;;   SHOULDER OPEN ROTATOR CUFF REPAIR Right     after hurting it by moving heavy objecs    FAMILY HISTORY:  We obtained a detailed, 4-generation family history.  Significant diagnoses are listed below: Family History  Problem Relation Age of Onset   Lung cancer Father        dx 65s-80s   Breast cancer Maternal Aunt        d. 68   Pancreatic cancer Maternal Aunt        d. 64   Prostate cancer Maternal Uncle        d. 50   Pancreatic cancer Maternal Grandfather        d. 43s   Breast cancer Cousin        dx 50s; maternal female cousin   Stomach cancer Cousin        d. 90; maternal female cousin   Pancreatic cancer Cousin        d. 19s; maternal female cousin       Kathleen Stone's sister, Kathleen Stone, had negative genetic testing with a VUS in NBN in 2020 through [facility].  No other family history of genetic testing was reported.   Kathleen Stone had limited information about her paternal family. There is no reported Ashkenazi Jewish ancestry. There is no known  consanguinity.   GENETIC TEST RESULTS:  The [facility] CancerNext-Expanded +RNAinsight Panel found no pathogenic mutations.   The CancerNext-Expanded gene panel offered by [facility] and includes sequencing, rearrangement, and RNA analysis for the following 77 genes: AIP, ALK, APC, ATM, AXIN2, BAP1, BARD1, BLM, BMPR1A, BRCA1, BRCA2, BRIP1, CDC73, CDH1, CDK4, CDKN1B, CDKN2A, CHEK2, CTNNA1, DICER1, FANCC, FH, FLCN, GALNT12, KIF1B, LZTR1, MAX, MEN1, MET, MLH1, MSH2, MSH3, MSH6, MUTYH, NBN, NF1, NF2, NTHL1, PALB2, PHOX2B, PMS2, POT1, PRKAR1A, PTCH1, PTEN, RAD51C, RAD51D, RB1, RECQL, RET, SDHA, SDHAF2, SDHB, SDHC, SDHD, SMAD4, SMARCA4, SMARCB1, SMARCE1, STK11, SUFU, TMEM127, TP53, TSC1, TSC2, VHL and XRCC2 (sequencing and deletion/duplication); EGFR, EGLN1, HOXB13, KIT, MITF, PDGFRA, POLD1, and POLE (sequencing only); EPCAM and GREM1 (deletion/duplication only).  .   The test  report has been scanned into EPIC and is located under the Molecular Pathology section of the Results Review tab.  A portion of the result report is included below for reference. Genetic testing reported out on August 27, 2022.     Even though a pathogenic variant was not identified, possible explanations for the cancer in the family may include: There may be no hereditary risk for cancer in the family. The cancers in Kathleen Stone and/or her family may be familial or due to other genetic and environmental factors. There may be a gene mutation in one of these genes that current testing methods cannot detect but that chance is small. There could be another gene that has not yet been discovered, or that we have not yet tested, that is responsible for the cancer diagnoses in the family.  It is also possible there is a hereditary cause for the cancer in the family that Kathleen Stone did not inherit.  Therefore, it is important to remain in touch with cancer genetics in the future so that we can continue to offer Kathleen Stone the most up to date  genetic testing.   ADDITIONAL GENETIC TESTING:  We discussed with Kathleen Stone that her genetic testing was fairly extensive.  If there are additional relevant genes identified to increase cancer risk that can be analyzed in the future, we would be happy to discuss and coordinate this testing at that time.     CANCER SCREENING RECOMMENDATIONS:  Kathleen Stone test result is considered negative (normal).  This means that we have not identified a hereditary cause for her personal history of pancreatic cancer at this time.   An individual's cancer risk and medical management are not determined by genetic test results alone. Overall cancer risk assessment incorporates additional factors, including personal medical history, family history, and any available genetic information that may result in a personalized plan for cancer prevention and surveillance. Therefore, it is recommended she continue to follow the cancer management and screening guidelines provided by her oncology and primary healthcare provider.  RECOMMENDATIONS FOR FAMILY MEMBERS:   Since she did not inherit a identifiable mutation in a cancer predisposition gene included on this panel, her daughter, Kathleen Stone, could not have inherited a known mutation from her in one of these genes. Individuals in this family might be at some increased risk of developing cancer, over the general population risk, due to the family history of cancer.  Individuals in the family should notify their providers of the family history of cancer.  Family members should speak with their providers about pancreatic cancer screening.  We discussed with Kathleen Stone that her siblings meet CAPS guidelines for pancreatic cancer screening based on family history of pancreatic cancer. We also recommend women in this family have a yearly mammogram beginning at age 61, or 42 years younger than the earliest onset of cancer, an annual clinical breast exam, and perform monthly breast  self-exams.   Other members of the family may still carry a pathogenic variant in one of these genes that Kathleen Stone did not inherit. Based on the family history, we recommend her cousin, Kathleen Stone, who was diagnosed with breast cancer, and Kathleen Stone, who was recently diagnosed with pancreatic cancer, have genetic counseling and testing.  Kathleen Stone knows that her siblings are also candidates for genetic testing.  Kathleen Stone will let us know if we can be of any assistance in coordinating genetic counseling and/or testing for these family members.  FOLLOW-UP:  Lastly, we discussed with Kathleen Stone. Farver that cancer genetics is a rapidly advancing field and it is possible that new genetic tests will be appropriate for her and/or her family members in the future. We encouraged her to remain in contact with cancer genetics on an annual basis so we can update her personal and family histories and let her know of advances in cancer genetics that may benefit this family.   Our contact number was provided. Kathleen Stone. [place] questions were answered to her satisfaction, and she knows she is welcome to call us at anytime with additional questions or concerns.   Circe Chilton M. Joette Catching, [place], [facility] Genetic Counselor [email or web address] (P) [phone]  The patient was seen for a total of 30 minutes in face-to-face genetic counseling.  The patient was accompanied by her sister, Kathleen Stone.  Drs. Lindi Adie and/or Burr Medico were available to discuss this case as needed.    _______________________________________________________________________ For Office Staff:  Number of people involved in session: 2 Was an Intern/ student involved with case: no

## 2022-10-09 ENCOUNTER — Other Ambulatory Visit: Payer: Self-pay | Admitting: Internal Medicine

## 2022-10-09 DIAGNOSIS — R5383 Other fatigue: Secondary | ICD-10-CM

## 2022-10-09 DIAGNOSIS — R0602 Shortness of breath: Secondary | ICD-10-CM

## 2022-10-11 ENCOUNTER — Encounter: Payer: Self-pay | Admitting: Nutrition

## 2022-10-11 NOTE — Progress Notes (Signed)
Contacted patient by phone to follow-up secondary to weight loss in the setting of pancreatic cancer  Weight has decreased to 153 pounds 3.2 ounces as of January 12 which is decreased from 170 pounds November 22.  This is second attempt to reach patient.  She was not available.  Left message and phone number for return call.Marland Kitchen

## 2022-10-12 ENCOUNTER — Other Ambulatory Visit: Payer: Self-pay | Admitting: Nurse Practitioner

## 2022-10-12 ENCOUNTER — Inpatient Hospital Stay: Payer: Commercial Managed Care - HMO

## 2022-10-12 ENCOUNTER — Encounter: Payer: Self-pay | Admitting: Nurse Practitioner

## 2022-10-12 ENCOUNTER — Telehealth: Payer: Self-pay

## 2022-10-12 ENCOUNTER — Other Ambulatory Visit: Payer: Self-pay

## 2022-10-12 ENCOUNTER — Inpatient Hospital Stay (HOSPITAL_BASED_OUTPATIENT_CLINIC_OR_DEPARTMENT_OTHER): Payer: Commercial Managed Care - HMO | Admitting: Nurse Practitioner

## 2022-10-12 ENCOUNTER — Encounter: Payer: Self-pay | Admitting: Oncology

## 2022-10-12 VITALS — BP 131/71 | HR 97 | Temp 98.2°F | Resp 18 | Ht 66.0 in | Wt 154.8 lb

## 2022-10-12 VITALS — BP 134/69 | HR 79

## 2022-10-12 DIAGNOSIS — C251 Malignant neoplasm of body of pancreas: Secondary | ICD-10-CM

## 2022-10-12 DIAGNOSIS — Z5111 Encounter for antineoplastic chemotherapy: Secondary | ICD-10-CM | POA: Diagnosis not present

## 2022-10-12 LAB — CBC WITH DIFFERENTIAL (CANCER CENTER ONLY)
Abs Immature Granulocytes: 0.15 10*3/uL — ABNORMAL HIGH (ref 0.00–0.07)
Basophils Absolute: 0 10*3/uL (ref 0.0–0.1)
Basophils Relative: 0 %
Eosinophils Absolute: 0.1 10*3/uL (ref 0.0–0.5)
Eosinophils Relative: 1 %
HCT: 31.8 % — ABNORMAL LOW (ref 36.0–46.0)
Hemoglobin: 10.4 g/dL — ABNORMAL LOW (ref 12.0–15.0)
Immature Granulocytes: 1 %
Lymphocytes Relative: 24 %
Lymphs Abs: 2.6 10*3/uL (ref 0.7–4.0)
MCH: 30.6 pg (ref 26.0–34.0)
MCHC: 32.7 g/dL (ref 30.0–36.0)
MCV: 93.5 fL (ref 80.0–100.0)
Monocytes Absolute: 1.1 10*3/uL — ABNORMAL HIGH (ref 0.1–1.0)
Monocytes Relative: 10 %
Neutro Abs: 7.1 10*3/uL (ref 1.7–7.7)
Neutrophils Relative %: 64 %
Platelet Count: 240 10*3/uL (ref 150–400)
RBC: 3.4 MIL/uL — ABNORMAL LOW (ref 3.87–5.11)
RDW: 23.4 % — ABNORMAL HIGH (ref 11.5–15.5)
WBC Count: 11 10*3/uL — ABNORMAL HIGH (ref 4.0–10.5)
nRBC: 0.3 % — ABNORMAL HIGH (ref 0.0–0.2)

## 2022-10-12 LAB — CMP (CANCER CENTER ONLY)
ALT: 40 U/L (ref 0–44)
AST: 25 U/L (ref 15–41)
Albumin: 3.1 g/dL — ABNORMAL LOW (ref 3.5–5.0)
Alkaline Phosphatase: 93 U/L (ref 38–126)
Anion gap: 4 — ABNORMAL LOW (ref 5–15)
BUN: 14 mg/dL (ref 6–20)
CO2: 35 mmol/L — ABNORMAL HIGH (ref 22–32)
Calcium: 9.3 mg/dL (ref 8.9–10.3)
Chloride: 103 mmol/L (ref 98–111)
Creatinine: 0.52 mg/dL (ref 0.44–1.00)
GFR, Estimated: >60 mL/min (ref >60)
Glucose, Bld: 154 mg/dL — ABNORMAL HIGH (ref 70–99)
Potassium: 3.5 mmol/L (ref 3.5–5.1)
Sodium: 142 mmol/L (ref 135–145)
Total Bilirubin: 0.7 mg/dL (ref 0.3–1.2)
Total Protein: 6.5 g/dL (ref 6.5–8.1)

## 2022-10-12 MED ORDER — HEPARIN SOD (PORK) LOCK FLUSH 100 UNIT/ML IV SOLN
500.0000 [IU] | Freq: Once | INTRAVENOUS | Status: AC | PRN
  Administered 2022-10-12: 500 [IU]

## 2022-10-12 MED ORDER — PACLITAXEL PROTEIN-BOUND CHEMO INJECTION 100 MG
100.0000 mg/m2 | Freq: Once | INTRAVENOUS | Status: AC
  Administered 2022-10-12: 175 mg via INTRAVENOUS
  Filled 2022-10-12: qty 35

## 2022-10-12 MED ORDER — SODIUM CHLORIDE 0.9% FLUSH
10.0000 mL | INTRAVENOUS | Status: DC | PRN
  Administered 2022-10-12: 10 mL

## 2022-10-12 MED ORDER — SODIUM CHLORIDE 0.9 % IV SOLN
1000.0000 mg/m2 | Freq: Once | INTRAVENOUS | Status: AC
  Administered 2022-10-12: 1787 mg via INTRAVENOUS
  Filled 2022-10-12: qty 26.3

## 2022-10-12 MED ORDER — PREDNISONE 50 MG PO TABS: ORAL_TABLET | ORAL | 0 refills | Status: DC

## 2022-10-12 MED ORDER — PROCHLORPERAZINE MALEATE 10 MG PO TABS
10.0000 mg | ORAL_TABLET | Freq: Once | ORAL | Status: AC
  Administered 2022-10-12: 10 mg via ORAL
  Filled 2022-10-12: qty 1

## 2022-10-12 MED ORDER — SODIUM CHLORIDE 0.9 % IV SOLN: Freq: Once | INTRAVENOUS | Status: AC

## 2022-10-12 NOTE — Progress Notes (Signed)
[place] OFFICE PROGRESS NOTE   Diagnosis:  Pancreas cancer  INTERVAL HISTORY:   Kathleen Stone returns as scheduled.  She completed cycle 4 gemcitabine/Abraxane 09/28/2022.  She denies nausea/vomiting.  No mouth sores.  No diarrhea.  No numbness or tingling in the hands or feet.  No fever or rash after treatment.  Main complaint is blurry vision.  No diplopia.  Objective:  Vital signs in last 24 hours:  Blood pressure 131/71, pulse 97, temperature 98.2 F (36.8 C), temperature source Oral, resp. rate 18, height '5\' 6"'$  (1.676 m), weight 154 lb 12.8 oz (70.2 kg), last menstrual period 02/07/2016, SpO2 100 %.    HEENT: Sclera anicteric.  Pupils equal round and reactive to light.  Oropharynx is without thrush or ulcers. Resp: Lungs clear bilaterally. Cardio: Regular rate and rhythm. GI: Abdomen is soft, mild generalized tenderness.  No mass.  No hepatomegaly. Vascular: No leg edema. Skin: No rash. Port-A-Cath without erythema.  Lab Results:  Lab Results  Component Value Date   WBC 11.0 (H) 10/12/2022   HGB 10.4 (L) 10/12/2022   HCT 31.8 (L) 10/12/2022   MCV 93.5 10/12/2022   PLT 240 10/12/2022   NEUTROABS 7.1 10/12/2022    Imaging:  No results found.  Medications: I have reviewed the patient's current medications.  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 CT abdomen/pelvis 07/30/2022-body/neck pancreas mass with peripheral pancreatic atrophy and duct dilation, suspected encasement of the celiac axis, uterine fibroids MRI abdomen 07/30/2022-3.7 x 4.7 cm pancreas body/tail mass with encasement of the proximal celiac artery, occlusion with collateralization at the confluence of the SMV and portal vein, no evidence of metastatic disease CT chest 08/01/2022-no intrathoracic metastases Elevated CA 19-9 EUS 08/02/2022-gastric polyp, gastric erosions, 32 x 33 mm pancreas body mass, invasion of the celiac trunk, invasion of the splenoportal confluence, no SMA invasion,  2 malignant appearing nodes in the porta hepatis,T4N1 by endoscopic criteria, FNA biopsy-adenocarcinoma 08/17/2022 [facility]-tumor mutation burden 0.96; MSI high not detected; variant of uncertain clinical significance NTRK3 T93M Cycle 1 gemcitabine/Abraxane 08/17/2022 Cycle 2 gemcitabine/Abraxane 08/31/2022 Cycle 3 gemcitabine/Abraxane 09/14/2022 Cycle 4 gemcitabine/Abraxane 09/28/2022 Cycle 5 gemcitabine/Abraxane 10/12/2022   2.  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  Anorexia/weight loss secondary to #1 4.  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  Abdominal pain 6.  History of colon polyps 7.  Family history of multiple cancers Pancreas cancer maternal aunt, maternal cousin, maternal grandfather Breast cancer-maternal aunt, maternal cousin Prostate cancer-maternal uncle Lung cancer-father 60.  Hypertension 9.  Postmenopausal vaginal bleeding 10.  H. pylori 08/02/2022-amoxicillin and Flagyl discontinued 08/21/2022 due to GI symptoms    Disposition: Ms. Pritz appears stable.  She has completed 4 cycles of gemcitabine/Abraxane.  She seems to be tolerating treatment well.  Plan to proceed with cycle 5 today as scheduled.  Restaging CT prior to next office visit.  She takes Benadryl 50 mg p.o. 1 hour prior to the scan.  CBC and chemistry panel reviewed.  Labs adequate to proceed as above.  The blurry vision could be related to prednisone she is taking for appetite stimulation.  She will decrease the dose to 5 mg daily for a week then every other day for a week then discontinue.  She is trying to arrange for an appointment with an eye doctor.  She will return for follow-up in 2 weeks.  We are available to see her sooner if needed.    Ned Card ANP/GNP-BC   10/12/2022  9:09 AM

## 2022-10-12 NOTE — Telephone Encounter (Signed)
error 

## 2022-10-12 NOTE — Patient Instructions (Signed)
[facility]   Discharge Instructions: Thank you for choosing [facility] to provide your oncology and hematology care.   If you have a lab appointment with the Cancer Center, please go directly to the Cancer Center and check in at the registration area.   Wear comfortable clothing and clothing appropriate for easy access to any Portacath or PICC line.   We strive to give you quality time with your provider. You may need to reschedule your appointment if you arrive late (15 or more minutes).  Arriving late affects you and other patients whose appointments are after yours.  Also, if you miss three or more appointments without notifying the office, you may be dismissed from the clinic at the provider's discretion.      For prescription refill requests, have your pharmacy contact our office and allow 72 hours for refills to be completed.    Today you received the following chemotherapy and/or immunotherapy agents Abraxane, Gemzar.      To help prevent nausea and vomiting after your treatment, we encourage you to take your nausea medication as directed.  BELOW ARE SYMPTOMS THAT SHOULD BE REPORTED IMMEDIATELY: *FEVER GREATER THAN 100.4 F (38 C) OR HIGHER *CHILLS OR SWEATING *NAUSEA AND VOMITING THAT IS NOT CONTROLLED WITH YOUR NAUSEA MEDICATION *UNUSUAL SHORTNESS OF BREATH *UNUSUAL BRUISING OR BLEEDING *URINARY PROBLEMS (pain or burning when urinating, or frequent urination) *BOWEL PROBLEMS (unusual diarrhea, constipation, pain near the anus) TENDERNESS IN MOUTH AND THROAT WITH OR WITHOUT PRESENCE OF ULCERS (sore throat, sores in mouth, or a toothache) UNUSUAL RASH, SWELLING OR PAIN  UNUSUAL VAGINAL DISCHARGE OR ITCHING   Items with * indicate a potential emergency and should be followed up as soon as possible or go to the Emergency Department if any problems should occur.  Please show the CHEMOTHERAPY ALERT CARD or IMMUNOTHERAPY ALERT CARD  at check-in to the Emergency Department and triage nurse.  Should you have questions after your visit or need to cancel or reschedule your appointment, please contact [facility]  Dept: [phone]  and follow the prompts.  Office hours are 8:00 a.m. to 4:30 p.m. Monday - Friday. Please note that voicemails left after 4:00 p.m. may not be returned until the following business day.  We are closed weekends and major holidays. You have access to a nurse at all times for urgent questions. Please call the main number to the clinic Dept: [phone] and follow the prompts.   For any non-urgent questions, you may also contact your provider using MyChart. We now offer e-Visits for anyone 18 and older to request care online for non-urgent symptoms. For details visit [email or web address].   Also download the MyChart app! Go to the app store, search "MyChart", open the app, select [place], and log in with your MyChart username and password.  Paclitaxel Nanoparticle Albumin-Bound Injection What is this medication? NANOPARTICLE ALBUMIN-BOUND PACLITAXEL (Na no PAHR ti kuhl al BYOO muhn-bound PAK li TAX el) treats some types of cancer. It works by slowing down the growth of cancer cells. This medicine may be used for other purposes; ask your health care provider or pharmacist if you have questions. COMMON BRAND NAME(S): Abraxane What should I tell my care team before I take this medication? They need to know if you have any of these conditions: Liver disease Low white blood cell levels An unusual or allergic reaction to paclitaxel, albumin, other medications, foods, dyes, or preservatives If   you or your partner are pregnant or trying to get pregnant Breast-feeding How should I use this medication? This medication is injected into a vein. It is given by your care team in a hospital or clinic setting. Talk to your care team about the use of this medication in  children. Special care may be needed. Overdosage: If you think you have taken too much of this medicine contact a poison control center or emergency room at once. NOTE: This medicine is only for you. Do not share this medicine with others. What if I miss a dose? Keep appointments for follow-up doses. It is important not to miss your dose. Call your care team if you are unable to keep an appointment. What may interact with this medication? Other medications may affect the way this medication works. Talk with your care team about all of the medications you take. They may suggest changes to your treatment plan to lower the risk of side effects and to make sure your medications work as intended. This list may not describe all possible interactions. Give your health care provider a list of all the medicines, herbs, non-prescription drugs, or dietary supplements you use. Also tell them if you smoke, drink alcohol, or use illegal drugs. Some items may interact with your medicine. What should I watch for while using this medication? Your condition will be monitored carefully while you are receiving this medication. You may need blood work while taking this medication. This medication may make you feel generally unwell. This is not uncommon as chemotherapy can affect healthy cells as well as cancer cells. Report any side effects. Continue your course of treatment even though you feel ill unless your care team tells you to stop. This medication can cause serious allergic reactions. To reduce the risk, your care team may give you other medications to take before receiving this one. Be sure to follow the directions from your care team. This medication may increase your risk of getting an infection. Call your care team for advice if you get a fever, chills, sore throat, or other symptoms of a cold or flu. Do not treat yourself. Try to avoid being around people who are sick. This medication may increase your risk to  bruise or bleed. Call your care team if you notice any unusual bleeding. Be careful brushing or flossing your teeth or using a toothpick because you may get an infection or bleed more easily. If you have any dental work done, tell your dentist you are receiving this medication. Talk to your care team if you or your partner may be pregnant. Serious birth defects can occur if you take this medication during pregnancy and for 6 months after the last dose. You will need a negative pregnancy test before starting this medication. Contraception is recommended while taking this medication and for 6 months after the last dose. Your care team can help you find the option that works for you. If your partner can get pregnant, use a condom during sex while taking this medication and for 3 months after the last dose. Do not breastfeed while taking this medication and for 2 weeks after the last dose. This medication may cause infertility. Talk to your care team if you are concerned about your fertility. What side effects may I notice from receiving this medication? Side effects that you should report to your care team as soon as possible: Allergic reactions--skin rash, itching, hives, swelling of the face, lips, tongue, or throat Dry cough, shortness   of breath or trouble breathing Infection--fever, chills, cough, sore throat, wounds that don't heal, pain or trouble when passing urine, general feeling of discomfort or being unwell Low red blood cell level--unusual weakness or fatigue, dizziness, headache, trouble breathing Pain, tingling, or numbness in the hands or feet Stomach pain, unusual weakness or fatigue, nausea, vomiting, diarrhea, or fever that lasts longer than expected Unusual bruising or bleeding Side effects that usually do not require medical attention (report to your care team if they continue or are bothersome): Diarrhea Fatigue Hair loss Loss of appetite Nausea Vomiting This list may not  describe all possible side effects. Call your doctor for medical advice about side effects. You may report side effects to FDA at 1-800-FDA-1088. Where should I keep my medication? This medication is given in a hospital or clinic. It will not be stored at home. NOTE: This sheet is a summary. It may not cover all possible information. If you have questions about this medicine, talk to your doctor, pharmacist, or health care provider.  2023 Elsevier/Gold Standard (2007-10-25 00:00:00) Gemcitabine Injection What is this medication? GEMCITABINE (jem SYE ta been) treats some types of cancer. It works by slowing down the growth of cancer cells. This medicine may be used for other purposes; ask your health care provider or pharmacist if you have questions. COMMON BRAND NAME(S): Gemzar, Infugem What should I tell my care team before I take this medication? They need to know if you have any of these conditions: Blood disorders Infection Kidney disease Liver disease Lung or breathing disease, such as asthma or COPD Recent or ongoing radiation therapy An unusual or allergic reaction to gemcitabine, other medications, foods, dyes, or preservatives If you or your partner are pregnant or trying to get pregnant Breast-feeding How should I use this medication? This medication is injected into a vein. It is given by your care team in a hospital or clinic setting. Talk to your care team about the use of this medication in children. Special care may be needed. Overdosage: If you think you have taken too much of this medicine contact a poison control center or emergency room at once. NOTE: This medicine is only for you. Do not share this medicine with others. What if I miss a dose? Keep appointments for follow-up doses. It is important not to miss your dose. Call your care team if you are unable to keep an appointment. What may interact with this medication? Interactions have not been studied. This list  may not describe all possible interactions. Give your health care provider a list of all the medicines, herbs, non-prescription drugs, or dietary supplements you use. Also tell them if you smoke, drink alcohol, or use illegal drugs. Some items may interact with your medicine. What should I watch for while using this medication? Your condition will be monitored carefully while you are receiving this medication. This medication may make you feel generally unwell. This is not uncommon, as chemotherapy can affect healthy cells as well as cancer cells. Report any side effects. Continue your course of treatment even though you feel ill unless your care team tells you to stop. In some cases, you may be given additional medications to help with side effects. Follow all directions for their use. This medication may increase your risk of getting an infection. Call your care team for advice if you get a fever, chills, sore throat, or other symptoms of a cold or flu. Do not treat yourself. Try to avoid being around people who   are sick. This medication may increase your risk to bruise or bleed. Call your care team if you notice any unusual bleeding. Be careful brushing or flossing your teeth or using a toothpick because you may get an infection or bleed more easily. If you have any dental work done, tell your dentist you are receiving this medication. Avoid taking medications that contain aspirin, acetaminophen, ibuprofen, naproxen, or ketoprofen unless instructed by your care team. These medications may hide a fever. Talk to your care team if you or your partner wish to become pregnant or think you might be pregnant. This medication can cause serious birth defects if taken during pregnancy and for 6 months after the last dose. A negative pregnancy test is required before starting this medication. A reliable form of contraception is recommended while taking this medication and for 6 months after the last dose. Talk to  your care team about effective forms of contraception. Do not father a child while taking this medication and for 3 months after the last dose. Use a condom while having sex during this time period. Do not breastfeed while taking this medication and for at least 1 week after the last dose. This medication may cause infertility. Talk to your care team if you are concerned about your fertility. What side effects may I notice from receiving this medication? Side effects that you should report to your care team as soon as possible: Allergic reactions--skin rash, itching, hives, swelling of the face, lips, tongue, or throat Capillary leak syndrome--stomach or muscle pain, unusual weakness or fatigue, feeling faint or lightheaded, decrease in the amount of urine, swelling of the ankles, hands, or feet, trouble breathing Infection--fever, chills, cough, sore throat, wounds that don't heal, pain or trouble when passing urine, general feeling of discomfort or being unwell Liver injury--right upper belly pain, loss of appetite, nausea, light-colored stool, dark yellow or brown urine, yellowing skin or eyes, unusual weakness or fatigue Low red blood cell level--unusual weakness or fatigue, dizziness, headache, trouble breathing Lung injury--shortness of breath or trouble breathing, cough, spitting up blood, chest pain, fever Stomach pain, bloody diarrhea, pale skin, unusual weakness or fatigue, decrease in the amount of urine, which may be signs of hemolytic uremic syndrome Sudden and severe headache, confusion, change in vision, seizures, which may be signs of posterior reversible encephalopathy syndrome (PRES) Unusual bruising or bleeding Side effects that usually do not require medical attention (report to your care team if they continue or are bothersome): Diarrhea Drowsiness Hair loss Nausea Pain, redness, or swelling with sores inside the mouth or throat Vomiting This list may not describe all  possible side effects. Call your doctor for medical advice about side effects. You may report side effects to FDA at 1-800-FDA-1088. Where should I keep my medication? This medication is given in a hospital or clinic. It will not be stored at home. NOTE: This sheet is a summary. It may not cover all possible information. If you have questions about this medicine, talk to your doctor, pharmacist, or health care provider.  2023 Elsevier/Gold Standard (2007-10-25 00:00:00)   

## 2022-10-12 NOTE — Progress Notes (Signed)
Spoke wit pt regarding allergy protocol for contrast. Pt verbalizes understanding and agrees with plan of care

## 2022-10-12 NOTE — Progress Notes (Signed)
Pt given nutrition supplement sample today during OV. Pt encouraged to call, nutritionist, Ernestene Kiel, and phone number provided. Pt verbalizes understanding.  Patient seen by Ned Card NP today  Vitals are within treatment parameters.  Labs reviewed by Ned Card NP and are within treatment parameters.  Per physician team, patient is ready for treatment and there are NO modifications to the treatment plan.

## 2022-10-14 ENCOUNTER — Other Ambulatory Visit: Payer: Self-pay

## 2022-10-16 LAB — CANCER ANTIGEN 19-9: CA 19-9: 204 U/mL — ABNORMAL HIGH (ref 0–35)

## 2022-10-17 NOTE — Progress Notes (Signed)
Internal Medicine Clinic Attending ° °Case discussed with Dr. Gawaluck  At the time of the visit.  We reviewed the resident’s history and exam and pertinent patient test results.  I agree with the assessment, diagnosis, and plan of care documented in the resident’s note.  °

## 2022-10-18 ENCOUNTER — Ambulatory Visit: Payer: Self-pay

## 2022-10-18 NOTE — Patient Instructions (Signed)
Visit Information  Thank you for taking time to visit with me today. Please don't hesitate to contact me if I can be of assistance to you.   Following are the goals we discussed today:   Goals Addressed             This Visit's Progress    COMPLETED: I don't understand why I am losing weight.       Care Coordination Interventions:   Evaluation of current treatment plan and patient's adherence to plan as established by provider Reviewed medications with patient and discussed  Active listening / Reflection utilized  Emotional Support Provided Problem Jennings strategies reviewed Verbalization of feelings encouraged  Get help from Social work at [employer] with paper work I had a conversation with Ms. Gahan, who told me that she is doing well. She shared that she has been granted food stamps, but is still waiting to hear about her disability status. Ms. Watson is feeling exhausted and not motivated to do much, but five days before she has her next treatment, her energy returns. I advised her to take it easy and not to push herself too hard. Unfortunately, she has lost her sense of taste, making it hard to eat anything, but she is keeping herself hydrated by drinking protein shakes and other fluids. During our conversation, Ms. Enneking mentioned that she wants to have a rollator when she is ambulating and a handicap placard. I have already reached out to the office, and they will be contacting Ms. Rezabek to start the process.     I want to eat more and and gain weight       Patient Goals/self care activities: Maintain a healthy weight Eat mall frequent meals through out the day if possible Stay hydrated Be observant of changes in bowel habits Choose foods that are easy to digest Eat protein rich food Talk with your physician about medications that can help increase your appetite.        Our next appointment is by telephone on 11/21/22 at 3 pm   Please call the care guide team at  [phone] if you need to cancel or reschedule your appointment.   If you are experiencing a Mental Health or [place] or need someone to talk to, please call 1-800-273-TALK (toll free, 24 hour hotline)  Patient verbalizes understanding of instructions and care plan provided today and agrees to view in [facility]. Active MyChart status and patient understanding of how to access instructions and care plan via MyChart confirmed with patient.     Lazaro Arms RN, BSN, [facility]   Phone: [phone]

## 2022-10-18 NOTE — Patient Outreach (Signed)
  Care Coordination   Follow Up Visit Note   10/18/2022 Name: Kathleen Stone MRN: [id number] DOB: 09/05/1966  Kathleen Stone is a 57 y.o. year old female who sees Masters, Kathleen Jersey, DO for primary care. I spoke with  Kathleen Stone by phone today.  What matters to the patients health and wellness today?   Kathleen Stone continues to experience difficulty with eating, but wants to make change to create a positive outcome. She also notified me that she has her rollator.  See below for interventions and patient self-care actives.     Goals Addressed                        I want to eat more and and gain weight       Patient Goals/self care activities: Maintain a healthy weight Eat mall frequent meals through out the day if possible Stay hydrated Be observant of changes in bowel habits Choose foods that are easy to digest Eat protein rich food Talk with your physician about medications that can help increase your appetite.        SDOH assessments and interventions completed:  No     Care Coordination Interventions:  Yes, provided   Interventions Today    Flowsheet Row Most Recent Value  Chronic Disease Discussed/Reviewed   Chronic disease discussed/reviewed during today's visit Other  [Pancreatic mass]  General Interventions   General Interventions Discussed/Reviewed General Interventions Discussed  Nutrition Interventions   Nutrition Discussed/Reviewed Nutrition Discussed  Pharmacy Interventions   Pharmacy Dicussed/Reviewed Pharmacy Topics Discussed        Follow up plan: Follow up call scheduled for 11/21/22 10 am    Encounter Outcome:  Pt. Visit Completed

## 2022-10-19 ENCOUNTER — Other Ambulatory Visit: Payer: Self-pay

## 2022-10-21 ENCOUNTER — Other Ambulatory Visit: Payer: Self-pay | Admitting: Oncology

## 2022-10-26 ENCOUNTER — Inpatient Hospital Stay: Payer: Commercial Managed Care - HMO | Attending: Nurse Practitioner

## 2022-10-26 ENCOUNTER — Inpatient Hospital Stay: Payer: Commercial Managed Care - HMO

## 2022-10-26 ENCOUNTER — Other Ambulatory Visit (HOSPITAL_BASED_OUTPATIENT_CLINIC_OR_DEPARTMENT_OTHER): Payer: Self-pay

## 2022-10-26 ENCOUNTER — Inpatient Hospital Stay (HOSPITAL_BASED_OUTPATIENT_CLINIC_OR_DEPARTMENT_OTHER): Payer: Commercial Managed Care - HMO | Admitting: Nurse Practitioner

## 2022-10-26 ENCOUNTER — Encounter: Payer: Self-pay | Admitting: Nurse Practitioner

## 2022-10-26 VITALS — BP 116/61

## 2022-10-26 VITALS — BP 140/42 | HR 100 | Temp 98.2°F | Resp 18 | Ht 66.0 in | Wt 160.0 lb

## 2022-10-26 DIAGNOSIS — I10 Essential (primary) hypertension: Secondary | ICD-10-CM | POA: Diagnosis not present

## 2022-10-26 DIAGNOSIS — Z79899 Other long term (current) drug therapy: Secondary | ICD-10-CM | POA: Diagnosis not present

## 2022-10-26 DIAGNOSIS — Z801 Family history of malignant neoplasm of trachea, bronchus and lung: Secondary | ICD-10-CM | POA: Insufficient documentation

## 2022-10-26 DIAGNOSIS — Z5111 Encounter for antineoplastic chemotherapy: Secondary | ICD-10-CM | POA: Insufficient documentation

## 2022-10-26 DIAGNOSIS — Z800 Family history of malignant neoplasm of digestive organs: Secondary | ICD-10-CM | POA: Insufficient documentation

## 2022-10-26 DIAGNOSIS — C251 Malignant neoplasm of body of pancreas: Secondary | ICD-10-CM | POA: Diagnosis present

## 2022-10-26 DIAGNOSIS — N950 Postmenopausal bleeding: Secondary | ICD-10-CM | POA: Diagnosis not present

## 2022-10-26 DIAGNOSIS — Z8719 Personal history of other diseases of the digestive system: Secondary | ICD-10-CM | POA: Diagnosis not present

## 2022-10-26 DIAGNOSIS — R630 Anorexia: Secondary | ICD-10-CM | POA: Diagnosis not present

## 2022-10-26 LAB — CBC WITH DIFFERENTIAL (CANCER CENTER ONLY)
Abs Immature Granulocytes: 0.09 10*3/uL — ABNORMAL HIGH (ref 0.00–0.07)
Basophils Absolute: 0 10*3/uL (ref 0.0–0.1)
Basophils Relative: 0 %
Eosinophils Absolute: 0.1 10*3/uL (ref 0.0–0.5)
Eosinophils Relative: 1 %
HCT: 32.4 % — ABNORMAL LOW (ref 36.0–46.0)
Hemoglobin: 10.8 g/dL — ABNORMAL LOW (ref 12.0–15.0)
Immature Granulocytes: 1 %
Lymphocytes Relative: 23 %
Lymphs Abs: 2.3 10*3/uL (ref 0.7–4.0)
MCH: 31.8 pg (ref 26.0–34.0)
MCHC: 33.3 g/dL (ref 30.0–36.0)
MCV: 95.3 fL (ref 80.0–100.0)
Monocytes Absolute: 1.1 10*3/uL — ABNORMAL HIGH (ref 0.1–1.0)
Monocytes Relative: 11 %
Neutro Abs: 6.5 10*3/uL (ref 1.7–7.7)
Neutrophils Relative %: 64 %
Platelet Count: 206 10*3/uL (ref 150–400)
RBC: 3.4 MIL/uL — ABNORMAL LOW (ref 3.87–5.11)
RDW: 23.9 % — ABNORMAL HIGH (ref 11.5–15.5)
WBC Count: 10.1 10*3/uL (ref 4.0–10.5)
nRBC: 0.2 % (ref 0.0–0.2)

## 2022-10-26 LAB — CMP (CANCER CENTER ONLY)
ALT: 23 U/L (ref 0–44)
AST: 24 U/L (ref 15–41)
Albumin: 3.3 g/dL — ABNORMAL LOW (ref 3.5–5.0)
Alkaline Phosphatase: 81 U/L (ref 38–126)
Anion gap: 6 (ref 5–15)
BUN: 15 mg/dL (ref 6–20)
CO2: 29 mmol/L (ref 22–32)
Calcium: 9.5 mg/dL (ref 8.9–10.3)
Chloride: 105 mmol/L (ref 98–111)
Creatinine: 0.55 mg/dL (ref 0.44–1.00)
GFR, Estimated: >60 mL/min (ref >60)
Glucose, Bld: 149 mg/dL — ABNORMAL HIGH (ref 70–99)
Potassium: 3.8 mmol/L (ref 3.5–5.1)
Sodium: 140 mmol/L (ref 135–145)
Total Bilirubin: 0.6 mg/dL (ref 0.3–1.2)
Total Protein: 6.5 g/dL (ref 6.5–8.1)

## 2022-10-26 MED ORDER — PROCHLORPERAZINE MALEATE 10 MG PO TABS: 10.0000 mg | ORAL_TABLET | Freq: Four times a day (QID) | ORAL | 1 refills | Status: DC | PRN

## 2022-10-26 MED ORDER — FLUCONAZOLE 100 MG PO TABS
100.0000 mg | ORAL_TABLET | Freq: Every day | ORAL | 0 refills | Status: AC
  Filled 2022-10-26: qty 4, 4d supply, fill #0

## 2022-10-26 MED ORDER — MIRTAZAPINE 15 MG PO TABS
15.0000 mg | ORAL_TABLET | Freq: Every day | ORAL | 1 refills | Status: DC
  Filled 2022-10-26: qty 30, 30d supply, fill #0

## 2022-10-26 MED ORDER — SODIUM CHLORIDE 0.9% FLUSH
10.0000 mL | INTRAVENOUS | Status: DC | PRN
  Administered 2022-10-26: 10 mL

## 2022-10-26 MED ORDER — PROCHLORPERAZINE MALEATE 10 MG PO TABS
10.0000 mg | ORAL_TABLET | Freq: Four times a day (QID) | ORAL | 1 refills | Status: DC | PRN
  Filled 2022-10-26: qty 30, 8d supply, fill #0

## 2022-10-26 MED ORDER — SODIUM CHLORIDE 0.9 % IV SOLN
1000.0000 mg/m2 | Freq: Once | INTRAVENOUS | Status: AC
  Administered 2022-10-26: 1787 mg via INTRAVENOUS
  Filled 2022-10-26: qty 47

## 2022-10-26 MED ORDER — MIRTAZAPINE 15 MG PO TABS: 15.0000 mg | ORAL_TABLET | Freq: Every day | ORAL | 1 refills | Status: DC

## 2022-10-26 MED ORDER — HEPARIN SOD (PORK) LOCK FLUSH 100 UNIT/ML IV SOLN
500.0000 [IU] | Freq: Once | INTRAVENOUS | Status: AC | PRN
  Administered 2022-10-26: 500 [IU]

## 2022-10-26 MED ORDER — PACLITAXEL PROTEIN-BOUND CHEMO INJECTION 100 MG
100.0000 mg/m2 | Freq: Once | INTRAVENOUS | Status: AC
  Administered 2022-10-26: 200 mg via INTRAVENOUS
  Filled 2022-10-26: qty 40

## 2022-10-26 MED ORDER — SODIUM CHLORIDE 0.9 % IV SOLN: Freq: Once | INTRAVENOUS | Status: AC

## 2022-10-26 MED ORDER — PROCHLORPERAZINE MALEATE 10 MG PO TABS
10.0000 mg | ORAL_TABLET | Freq: Once | ORAL | Status: AC
  Administered 2022-10-26: 10 mg via ORAL
  Filled 2022-10-26: qty 1

## 2022-10-26 NOTE — Patient Instructions (Signed)
[facility]   Discharge Instructions: Thank you for choosing [facility] to provide your oncology and hematology care.   If you have a lab appointment with the [facility], please go directly to the [facility] and check in at the registration area.   Wear comfortable clothing and clothing appropriate for easy access to any Portacath or PICC line.   We strive to give you quality time with your provider. You may need to reschedule your appointment if you arrive late (15 or more minutes).  Arriving late affects you and other patients whose appointments are after yours.  Also, if you miss three or more appointments without notifying the office, you may be dismissed from the clinic at the provider's discretion.      For prescription refill requests, have your pharmacy contact our office and allow 72 hours for refills to be completed.    Today you received the following chemotherapy and/or immunotherapy agents Abraxane, Gemzar.      To help prevent nausea and vomiting after your treatment, we encourage you to take your nausea medication as directed.  BELOW ARE SYMPTOMS THAT SHOULD BE REPORTED IMMEDIATELY: *FEVER GREATER THAN 100.4 F (38 C) OR HIGHER *CHILLS OR SWEATING *NAUSEA AND VOMITING THAT IS NOT CONTROLLED WITH YOUR NAUSEA MEDICATION *UNUSUAL SHORTNESS OF BREATH *UNUSUAL BRUISING OR BLEEDING *URINARY PROBLEMS (pain or burning when urinating, or frequent urination) *BOWEL PROBLEMS (unusual diarrhea, constipation, pain near the anus) TENDERNESS IN MOUTH AND THROAT WITH OR WITHOUT PRESENCE OF ULCERS (sore throat, sores in mouth, or a toothache) UNUSUAL RASH, SWELLING OR PAIN  UNUSUAL VAGINAL DISCHARGE OR ITCHING   Items with * indicate a potential emergency and should be followed up as soon as possible or go to the Emergency Department if any problems should occur.  Please show the CHEMOTHERAPY ALERT CARD or IMMUNOTHERAPY ALERT CARD  at check-in to the Emergency Department and triage nurse.  Should you have questions after your visit or need to cancel or reschedule your appointment, please contact [facility]  Dept: [phone]  and follow the prompts.  Office hours are 8:00 a.m. to 4:30 p.m. Monday - Friday. Please note that voicemails left after 4:00 p.m. may not be returned until the following business day.  We are closed weekends and major holidays. You have access to a nurse at all times for urgent questions. Please call the main number to the clinic Dept: [phone] and follow the prompts.   For any non-urgent questions, you may also contact your provider using MyChart. We now offer e-Visits for anyone 64 and older to request care online for non-urgent symptoms. For details visit [email or web address].   Also download the MyChart app! Go to the app store, search "MyChart", open the app, select [facility], and log in with your MyChart username and password.  Paclitaxel Nanoparticle Albumin-Bound Injection What is this medication? NANOPARTICLE ALBUMIN-BOUND PACLITAXEL (Na no PAHR ti kuhl al BYOO muhn-bound PAK li TAX el) treats some types of cancer. It works by slowing down the growth of cancer cells. This medicine may be used for other purposes; ask your health care provider or pharmacist if you have questions. COMMON BRAND NAME(S): Abraxane What should I tell my care team before I take this medication? They need to know if you have any of these conditions: Liver disease Low white blood cell levels An unusual or allergic reaction to paclitaxel, albumin, other medications, foods, dyes, or preservatives If  you or your partner are pregnant or trying to get pregnant Breast-feeding How should I use this medication? This medication is injected into a vein. It is given by your care team in a hospital or clinic setting. Talk to your care team about the use of this medication in  children. Special care may be needed. Overdosage: If you think you have taken too much of this medicine contact a poison control center or emergency room at once. NOTE: This medicine is only for you. Do not share this medicine with others. What if I miss a dose? Keep appointments for follow-up doses. It is important not to miss your dose. Call your care team if you are unable to keep an appointment. What may interact with this medication? Other medications may affect the way this medication works. Talk with your care team about all of the medications you take. They may suggest changes to your treatment plan to lower the risk of side effects and to make sure your medications work as intended. This list may not describe all possible interactions. Give your health care provider a list of all the medicines, herbs, non-prescription drugs, or dietary supplements you use. Also tell them if you smoke, drink alcohol, or use illegal drugs. Some items may interact with your medicine. What should I watch for while using this medication? Your condition will be monitored carefully while you are receiving this medication. You may need blood work while taking this medication. This medication may make you feel generally unwell. This is not uncommon as chemotherapy can affect healthy cells as well as cancer cells. Report any side effects. Continue your course of treatment even though you feel ill unless your care team tells you to stop. This medication can cause serious allergic reactions. To reduce the risk, your care team may give you other medications to take before receiving this one. Be sure to follow the directions from your care team. This medication may increase your risk of getting an infection. Call your care team for advice if you get a fever, chills, sore throat, or other symptoms of a cold or flu. Do not treat yourself. Try to avoid being around people who are sick. This medication may increase your risk to  bruise or bleed. Call your care team if you notice any unusual bleeding. Be careful brushing or flossing your teeth or using a toothpick because you may get an infection or bleed more easily. If you have any dental work done, tell your dentist you are receiving this medication. Talk to your care team if you or your partner may be pregnant. Serious birth defects can occur if you take this medication during pregnancy and for 6 months after the last dose. You will need a negative pregnancy test before starting this medication. Contraception is recommended while taking this medication and for 6 months after the last dose. Your care team can help you find the option that works for you. If your partner can get pregnant, use a condom during sex while taking this medication and for 3 months after the last dose. Do not breastfeed while taking this medication and for 2 weeks after the last dose. This medication may cause infertility. Talk to your care team if you are concerned about your fertility. What side effects may I notice from receiving this medication? Side effects that you should report to your care team as soon as possible: Allergic reactions--skin rash, itching, hives, swelling of the face, lips, tongue, or throat Dry cough, shortness  of breath or trouble breathing Infection--fever, chills, cough, sore throat, wounds that don't heal, pain or trouble when passing urine, general feeling of discomfort or being unwell Low red blood cell level--unusual weakness or fatigue, dizziness, headache, trouble breathing Pain, tingling, or numbness in the hands or feet Stomach pain, unusual weakness or fatigue, nausea, vomiting, diarrhea, or fever that lasts longer than expected Unusual bruising or bleeding Side effects that usually do not require medical attention (report to your care team if they continue or are bothersome): Diarrhea Fatigue Hair loss Loss of appetite Nausea Vomiting This list may not  describe all possible side effects. Call your doctor for medical advice about side effects. You may report side effects to FDA at 1-800-FDA-1088. Where should I keep my medication? This medication is given in a hospital or clinic. It will not be stored at home. NOTE: This sheet is a summary. It may not cover all possible information. If you have questions about this medicine, talk to your doctor, pharmacist, or health care provider.  2023 Elsevier/Gold Standard (2007-10-25 00:00:00) Gemcitabine Injection What is this medication? GEMCITABINE (jem SYE ta been) treats some types of cancer. It works by slowing down the growth of cancer cells. This medicine may be used for other purposes; ask your health care provider or pharmacist if you have questions. COMMON BRAND NAME(S): Gemzar, Infugem What should I tell my care team before I take this medication? They need to know if you have any of these conditions: Blood disorders Infection Kidney disease Liver disease Lung or breathing disease, such as asthma or COPD Recent or ongoing radiation therapy An unusual or allergic reaction to gemcitabine, other medications, foods, dyes, or preservatives If you or your partner are pregnant or trying to get pregnant Breast-feeding How should I use this medication? This medication is injected into a vein. It is given by your care team in a hospital or clinic setting. Talk to your care team about the use of this medication in children. Special care may be needed. Overdosage: If you think you have taken too much of this medicine contact a poison control center or emergency room at once. NOTE: This medicine is only for you. Do not share this medicine with others. What if I miss a dose? Keep appointments for follow-up doses. It is important not to miss your dose. Call your care team if you are unable to keep an appointment. What may interact with this medication? Interactions have not been studied. This list  may not describe all possible interactions. Give your health care provider a list of all the medicines, herbs, non-prescription drugs, or dietary supplements you use. Also tell them if you smoke, drink alcohol, or use illegal drugs. Some items may interact with your medicine. What should I watch for while using this medication? Your condition will be monitored carefully while you are receiving this medication. This medication may make you feel generally unwell. This is not uncommon, as chemotherapy can affect healthy cells as well as cancer cells. Report any side effects. Continue your course of treatment even though you feel ill unless your care team tells you to stop. In some cases, you may be given additional medications to help with side effects. Follow all directions for their use. This medication may increase your risk of getting an infection. Call your care team for advice if you get a fever, chills, sore throat, or other symptoms of a cold or flu. Do not treat yourself. Try to avoid being around people who  are sick. This medication may increase your risk to bruise or bleed. Call your care team if you notice any unusual bleeding. Be careful brushing or flossing your teeth or using a toothpick because you may get an infection or bleed more easily. If you have any dental work done, tell your dentist you are receiving this medication. Avoid taking medications that contain aspirin, acetaminophen, ibuprofen, naproxen, or ketoprofen unless instructed by your care team. These medications may hide a fever. Talk to your care team if you or your partner wish to become pregnant or think you might be pregnant. This medication can cause serious birth defects if taken during pregnancy and for 6 months after the last dose. A negative pregnancy test is required before starting this medication. A reliable form of contraception is recommended while taking this medication and for 6 months after the last dose. Talk to  your care team about effective forms of contraception. Do not father a child while taking this medication and for 3 months after the last dose. Use a condom while having sex during this time period. Do not breastfeed while taking this medication and for at least 1 week after the last dose. This medication may cause infertility. Talk to your care team if you are concerned about your fertility. What side effects may I notice from receiving this medication? Side effects that you should report to your care team as soon as possible: Allergic reactions--skin rash, itching, hives, swelling of the face, lips, tongue, or throat Capillary leak syndrome--stomach or muscle pain, unusual weakness or fatigue, feeling faint or lightheaded, decrease in the amount of urine, swelling of the ankles, hands, or feet, trouble breathing Infection--fever, chills, cough, sore throat, wounds that don't heal, pain or trouble when passing urine, general feeling of discomfort or being unwell Liver injury--right upper belly pain, loss of appetite, nausea, light-colored stool, dark yellow or brown urine, yellowing skin or eyes, unusual weakness or fatigue Low red blood cell level--unusual weakness or fatigue, dizziness, headache, trouble breathing Lung injury--shortness of breath or trouble breathing, cough, spitting up blood, chest pain, fever Stomach pain, bloody diarrhea, pale skin, unusual weakness or fatigue, decrease in the amount of urine, which may be signs of hemolytic uremic syndrome Sudden and severe headache, confusion, change in vision, seizures, which may be signs of posterior reversible encephalopathy syndrome (PRES) Unusual bruising or bleeding Side effects that usually do not require medical attention (report to your care team if they continue or are bothersome): Diarrhea Drowsiness Hair loss Nausea Pain, redness, or swelling with sores inside the mouth or throat Vomiting This list may not describe all  possible side effects. Call your doctor for medical advice about side effects. You may report side effects to FDA at 1-800-FDA-1088. Where should I keep my medication? This medication is given in a hospital or clinic. It will not be stored at home. NOTE: This sheet is a summary. It may not cover all possible information. If you have questions about this medicine, talk to your doctor, pharmacist, or health care provider.  2023 Elsevier/Gold Standard (2007-10-25 00:00:00)

## 2022-10-26 NOTE — Progress Notes (Signed)
[facility] OFFICE PROGRESS NOTE   Diagnosis: Pancreas cancer  INTERVAL HISTORY:   Kathleen Stone returns as scheduled.  She completed cycle 5 gemcitabine/Abraxane 10/12/2022.  She was referred for CT scans following her last office visit but unfortunately scans were not scheduled.  She denies nausea/vomiting.  No mouth sores.  No diarrhea.  No fever or rash after treatment.  No cough or shortness of breath.  She no longer has abdominal pain.  She notes poor appetite and an alteration in taste.  Objective:  Vital signs in last 24 hours:  Blood pressure (!) 140/42, pulse 100, temperature 98.2 F (36.8 C), temperature source Oral, resp. rate 18, height 5' 6"$  (1.676 m), weight 160 lb (72.6 kg), last menstrual period 02/07/2016, SpO2 100 %.    HEENT: Mild white coating over tongue. Resp: Lungs clear bilaterally. Cardio: Regular rate and rhythm. GI: Abdomen soft and nontender.  No hepatosplenomegaly. Vascular: No leg edema.  Calves soft and nontender. Neuro: Alert and oriented. Port-A-Cath without erythema.  Lab Results:  Lab Results  Component Value Date   WBC 10.1 10/26/2022   HGB 10.8 (L) 10/26/2022   HCT 32.4 (L) 10/26/2022   MCV 95.3 10/26/2022   PLT 206 10/26/2022   NEUTROABS 6.5 10/26/2022    Imaging:  No results found.  Medications: I have reviewed the patient's current medications.  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 CT abdomen/pelvis 07/30/2022-body/neck pancreas mass with peripheral pancreatic atrophy and duct dilation, suspected encasement of the celiac axis, uterine fibroids MRI abdomen 07/30/2022-3.7 x 4.7 cm pancreas body/tail mass with encasement of the proximal celiac artery, occlusion with collateralization at the confluence of the SMV and portal vein, no evidence of metastatic disease CT chest 08/01/2022-no intrathoracic metastases Elevated CA 19-9 EUS 08/02/2022-gastric polyp, gastric erosions, 32 x 33 mm pancreas body mass, invasion of  the celiac trunk, invasion of the splenoportal confluence, no SMA invasion, 2 malignant appearing nodes in the porta hepatis,T4N1 by endoscopic criteria, FNA biopsy-adenocarcinoma 08/17/2022 [facility]-tumor mutation burden 0.96; MSI high not detected; 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  Anorexia/weight loss secondary to #1 4.  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  Abdominal pain 6.  History of colon polyps 7.  Family history of multiple cancers Pancreas cancer maternal aunt, maternal cousin, maternal grandfather Breast cancer-maternal aunt, maternal cousin Prostate cancer-maternal uncle Lung cancer-father 71.  Hypertension 9.  Postmenopausal vaginal bleeding 10.  H. pylori 08/02/2022-amoxicillin and Flagyl discontinued 08/21/2022 due to GI symptoms  Disposition: Ms. Tewell appears stable.  She has completed 5 cycles of gemcitabine/Abraxane.  She has been referred for restaging CTs abdomen/pelvis pancreatic protocol following last office visit.  Unfortunately scans were not scheduled.  Plan to proceed with cycle 6 gemcitabine/Abraxane today as scheduled.  We will try to get restaging CTs scheduled in the next 10 days.  She has a poor appetite and alteration in taste.  She will begin Remeron 15 mg at bedtime.  She appears to have yeast on the tongue.  She will complete a 4-day course of Diflucan.  This may help with the alteration in taste.  CBC and chemistry panel reviewed.  Labs adequate to proceed with treatment today as scheduled.  She will return for follow-up in 2 weeks.  Ned Card ANP/GNP-BC    10/26/2022  9:19 AM

## 2022-10-26 NOTE — Progress Notes (Signed)
Patient seen by Lisa Thomas NP today  Vitals are within treatment parameters.  Labs reviewed by Lisa Thomas NP and are within treatment parameters.  Per physician team, patient is ready for treatment and there are NO modifications to the treatment plan.     

## 2022-10-27 ENCOUNTER — Ambulatory Visit (HOSPITAL_BASED_OUTPATIENT_CLINIC_OR_DEPARTMENT_OTHER)
Admission: RE | Admit: 2022-10-27 | Discharge: 2022-10-27 | Disposition: A | Discharge status: Home / Self Care | Payer: Commercial Managed Care - HMO | Source: Ambulatory Visit | Attending: Nurse Practitioner | Admitting: Nurse Practitioner

## 2022-10-27 DIAGNOSIS — C251 Malignant neoplasm of body of pancreas: Secondary | ICD-10-CM | POA: Diagnosis present

## 2022-10-27 MED ORDER — IOHEXOL 300 MG/ML  SOLN
100.0000 mL | Freq: Once | INTRAMUSCULAR | Status: AC | PRN
  Administered 2022-10-27: 100 mL via INTRAVENOUS

## 2022-10-29 ENCOUNTER — Encounter: Payer: Self-pay | Admitting: *Deleted

## 2022-10-30 ENCOUNTER — Telehealth: Payer: Self-pay

## 2022-10-30 ENCOUNTER — Encounter: Payer: Self-pay | Admitting: Oncology

## 2022-10-30 NOTE — Telephone Encounter (Signed)
Called the patient regarding her family members FMLA papers.  Patient was asked to please let her sister know to have the papers sent again to Dr. Gearldine Shown office and they will forward them to be filled out.  Patient verbalized understanding.

## 2022-11-01 LAB — CANCER ANTIGEN 19-9: CA 19-9: 141 U/mL — ABNORMAL HIGH (ref 0–35)

## 2022-11-04 ENCOUNTER — Other Ambulatory Visit: Payer: Self-pay | Admitting: Oncology

## 2022-11-05 NOTE — Progress Notes (Signed)
[facility] papers faxed for the caregiver with confirmation received.  A copy was emailed to the caregiver per the cover sheet request.  Fax no. [phone]

## 2022-11-07 ENCOUNTER — Other Ambulatory Visit: Payer: Self-pay

## 2022-11-07 NOTE — Progress Notes (Signed)
The proposed treatment discussed in conference is for discussion purpose only and is not a binding recommendation.  The patients have not been physically examined, or presented with their treatment options.  Therefore, final treatment plans cannot be decided.  

## 2022-11-09 ENCOUNTER — Inpatient Hospital Stay (HOSPITAL_BASED_OUTPATIENT_CLINIC_OR_DEPARTMENT_OTHER): Payer: Commercial Managed Care - HMO | Admitting: Oncology

## 2022-11-09 ENCOUNTER — Inpatient Hospital Stay: Payer: Commercial Managed Care - HMO

## 2022-11-09 VITALS — BP 139/66 | HR 79 | Temp 98.2°F | Resp 18 | Ht 66.0 in | Wt 159.6 lb

## 2022-11-09 VITALS — BP 124/59 | HR 68

## 2022-11-09 DIAGNOSIS — C251 Malignant neoplasm of body of pancreas: Secondary | ICD-10-CM

## 2022-11-09 DIAGNOSIS — Z5111 Encounter for antineoplastic chemotherapy: Secondary | ICD-10-CM | POA: Diagnosis not present

## 2022-11-09 LAB — CBC WITH DIFFERENTIAL (CANCER CENTER ONLY)
Abs Immature Granulocytes: 0.06 10*3/uL (ref 0.00–0.07)
Basophils Absolute: 0 10*3/uL (ref 0.0–0.1)
Basophils Relative: 0 %
Eosinophils Absolute: 0.1 10*3/uL (ref 0.0–0.5)
Eosinophils Relative: 1 %
HCT: 35.4 % — ABNORMAL LOW (ref 36.0–46.0)
Hemoglobin: 11.8 g/dL — ABNORMAL LOW (ref 12.0–15.0)
Immature Granulocytes: 1 %
Lymphocytes Relative: 24 %
Lymphs Abs: 2.5 10*3/uL (ref 0.7–4.0)
MCH: 32.1 pg (ref 26.0–34.0)
MCHC: 33.3 g/dL (ref 30.0–36.0)
MCV: 96.2 fL (ref 80.0–100.0)
Monocytes Absolute: 0.9 10*3/uL (ref 0.1–1.0)
Monocytes Relative: 8 %
Neutro Abs: 7 10*3/uL (ref 1.7–7.7)
Neutrophils Relative %: 66 %
Platelet Count: 185 10*3/uL (ref 150–400)
RBC: 3.68 MIL/uL — ABNORMAL LOW (ref 3.87–5.11)
RDW: 20 % — ABNORMAL HIGH (ref 11.5–15.5)
WBC Count: 10.4 10*3/uL (ref 4.0–10.5)
nRBC: 0 % (ref 0.0–0.2)

## 2022-11-09 LAB — CMP (CANCER CENTER ONLY)
ALT: 22 U/L (ref 0–44)
AST: 24 U/L (ref 15–41)
Albumin: 3.6 g/dL (ref 3.5–5.0)
Alkaline Phosphatase: 74 U/L (ref 38–126)
Anion gap: 7 (ref 5–15)
BUN: 12 mg/dL (ref 6–20)
CO2: 29 mmol/L (ref 22–32)
Calcium: 9.7 mg/dL (ref 8.9–10.3)
Chloride: 105 mmol/L (ref 98–111)
Creatinine: 0.65 mg/dL (ref 0.44–1.00)
GFR, Estimated: >60 mL/min (ref >60)
Glucose, Bld: 95 mg/dL (ref 70–99)
Potassium: 4.1 mmol/L (ref 3.5–5.1)
Sodium: 141 mmol/L (ref 135–145)
Total Bilirubin: 0.8 mg/dL (ref 0.3–1.2)
Total Protein: 6.9 g/dL (ref 6.5–8.1)

## 2022-11-09 MED ORDER — SODIUM CHLORIDE 0.9 % IV SOLN: Freq: Once | INTRAVENOUS | Status: AC

## 2022-11-09 MED ORDER — SODIUM CHLORIDE 0.9% FLUSH
10.0000 mL | INTRAVENOUS | Status: DC | PRN
  Administered 2022-11-09: 10 mL

## 2022-11-09 MED ORDER — PACLITAXEL PROTEIN-BOUND CHEMO INJECTION 100 MG
100.0000 mg/m2 | Freq: Once | INTRAVENOUS | Status: AC
  Administered 2022-11-09: 200 mg via INTRAVENOUS
  Filled 2022-11-09: qty 40

## 2022-11-09 MED ORDER — PROCHLORPERAZINE MALEATE 10 MG PO TABS
10.0000 mg | ORAL_TABLET | Freq: Once | ORAL | Status: AC
  Administered 2022-11-09: 10 mg via ORAL
  Filled 2022-11-09: qty 1

## 2022-11-09 MED ORDER — SODIUM CHLORIDE 0.9 % IV SOLN
1000.0000 mg/m2 | Freq: Once | INTRAVENOUS | Status: AC
  Administered 2022-11-09: 1787 mg via INTRAVENOUS
  Filled 2022-11-09: qty 26.28

## 2022-11-09 MED ORDER — HEPARIN SOD (PORK) LOCK FLUSH 100 UNIT/ML IV SOLN
500.0000 [IU] | Freq: Once | INTRAVENOUS | Status: AC | PRN
  Administered 2022-11-09: 500 [IU]

## 2022-11-09 NOTE — Progress Notes (Signed)
Patient seen by Dr. Sherrill today ? ?Vitals are within treatment parameters. ? ?Labs reviewed by Dr. Sherrill and are within treatment parameters. ? ?Per physician team, patient is ready for treatment and there are NO modifications to the treatment plan.  ?

## 2022-11-09 NOTE — Patient Instructions (Signed)
[facility]   Discharge Instructions: Thank you for choosing [facility] to provide your oncology and hematology care.   If you have a lab appointment with the [facility], please go directly to the [facility] and check in at the registration area.   Wear comfortable clothing and clothing appropriate for easy access to any Portacath or PICC line.   We strive to give you quality time with your provider. You may need to reschedule your appointment if you arrive late (15 or more minutes).  Arriving late affects you and other patients whose appointments are after yours.  Also, if you miss three or more appointments without notifying the office, you may be dismissed from the clinic at the provider's discretion.      For prescription refill requests, have your pharmacy contact our office and allow 72 hours for refills to be completed.    Today you received the following chemotherapy and/or immunotherapy agents Abraxane, Gemzar.      To help prevent nausea and vomiting after your treatment, we encourage you to take your nausea medication as directed.  BELOW ARE SYMPTOMS THAT SHOULD BE REPORTED IMMEDIATELY: *FEVER GREATER THAN 100.4 F (38 C) OR HIGHER *CHILLS OR SWEATING *NAUSEA AND VOMITING THAT IS NOT CONTROLLED WITH YOUR NAUSEA MEDICATION *UNUSUAL SHORTNESS OF BREATH *UNUSUAL BRUISING OR BLEEDING *URINARY PROBLEMS (pain or burning when urinating, or frequent urination) *BOWEL PROBLEMS (unusual diarrhea, constipation, pain near the anus) TENDERNESS IN MOUTH AND THROAT WITH OR WITHOUT PRESENCE OF ULCERS (sore throat, sores in mouth, or a toothache) UNUSUAL RASH, SWELLING OR PAIN  UNUSUAL VAGINAL DISCHARGE OR ITCHING   Items with * indicate a potential emergency and should be followed up as soon as possible or go to the Emergency Department if any problems should occur.  Please show the CHEMOTHERAPY ALERT CARD or IMMUNOTHERAPY ALERT CARD  at check-in to the Emergency Department and triage nurse.  Should you have questions after your visit or need to cancel or reschedule your appointment, please contact [facility]  Dept: [phone]  and follow the prompts.  Office hours are 8:00 a.m. to 4:30 p.m. Monday - Friday. Please note that voicemails left after 4:00 p.m. may not be returned until the following business day.  We are closed weekends and major holidays. You have access to a nurse at all times for urgent questions. Please call the main number to the clinic Dept: [phone] and follow the prompts.   For any non-urgent questions, you may also contact your provider using MyChart. We now offer e-Visits for anyone 31 and older to request care online for non-urgent symptoms. For details visit [email or web address].   Also download the MyChart app! Go to the app store, search "MyChart", open the app, select [facility], and log in with your MyChart username and password.  Paclitaxel Nanoparticle Albumin-Bound Injection What is this medication? NANOPARTICLE ALBUMIN-BOUND PACLITAXEL (Na no PAHR ti kuhl al BYOO muhn-bound PAK li TAX el) treats some types of cancer. It works by slowing down the growth of cancer cells. This medicine may be used for other purposes; ask your health care provider or pharmacist if you have questions. COMMON BRAND NAME(S): Abraxane What should I tell my care team before I take this medication? They need to know if you have any of these conditions: Liver disease Low white blood cell levels An unusual or allergic reaction to paclitaxel, albumin, other medications, foods, dyes, or preservatives If  you or your partner are pregnant or trying to get pregnant Breast-feeding How should I use this medication? This medication is injected into a vein. It is given by your care team in a hospital or clinic setting. Talk to your care team about the use of this medication in  children. Special care may be needed. Overdosage: If you think you have taken too much of this medicine contact a poison control center or emergency room at once. NOTE: This medicine is only for you. Do not share this medicine with others. What if I miss a dose? Keep appointments for follow-up doses. It is important not to miss your dose. Call your care team if you are unable to keep an appointment. What may interact with this medication? Other medications may affect the way this medication works. Talk with your care team about all of the medications you take. They may suggest changes to your treatment plan to lower the risk of side effects and to make sure your medications work as intended. This list may not describe all possible interactions. Give your health care provider a list of all the medicines, herbs, non-prescription drugs, or dietary supplements you use. Also tell them if you smoke, drink alcohol, or use illegal drugs. Some items may interact with your medicine. What should I watch for while using this medication? Your condition will be monitored carefully while you are receiving this medication. You may need blood work while taking this medication. This medication may make you feel generally unwell. This is not uncommon as chemotherapy can affect healthy cells as well as cancer cells. Report any side effects. Continue your course of treatment even though you feel ill unless your care team tells you to stop. This medication can cause serious allergic reactions. To reduce the risk, your care team may give you other medications to take before receiving this one. Be sure to follow the directions from your care team. This medication may increase your risk of getting an infection. Call your care team for advice if you get a fever, chills, sore throat, or other symptoms of a cold or flu. Do not treat yourself. Try to avoid being around people who are sick. This medication may increase your risk to  bruise or bleed. Call your care team if you notice any unusual bleeding. Be careful brushing or flossing your teeth or using a toothpick because you may get an infection or bleed more easily. If you have any dental work done, tell your dentist you are receiving this medication. Talk to your care team if you or your partner may be pregnant. Serious birth defects can occur if you take this medication during pregnancy and for 6 months after the last dose. You will need a negative pregnancy test before starting this medication. Contraception is recommended while taking this medication and for 6 months after the last dose. Your care team can help you find the option that works for you. If your partner can get pregnant, use a condom during sex while taking this medication and for 3 months after the last dose. Do not breastfeed while taking this medication and for 2 weeks after the last dose. This medication may cause infertility. Talk to your care team if you are concerned about your fertility. What side effects may I notice from receiving this medication? Side effects that you should report to your care team as soon as possible: Allergic reactions--skin rash, itching, hives, swelling of the face, lips, tongue, or throat Dry cough, shortness  of breath or trouble breathing Infection--fever, chills, cough, sore throat, wounds that don't heal, pain or trouble when passing urine, general feeling of discomfort or being unwell Low red blood cell level--unusual weakness or fatigue, dizziness, headache, trouble breathing Pain, tingling, or numbness in the hands or feet Stomach pain, unusual weakness or fatigue, nausea, vomiting, diarrhea, or fever that lasts longer than expected Unusual bruising or bleeding Side effects that usually do not require medical attention (report to your care team if they continue or are bothersome): Diarrhea Fatigue Hair loss Loss of appetite Nausea Vomiting This list may not  describe all possible side effects. Call your doctor for medical advice about side effects. You may report side effects to FDA at 1-800-FDA-1088. Where should I keep my medication? This medication is given in a hospital or clinic. It will not be stored at home. NOTE: This sheet is a summary. It may not cover all possible information. If you have questions about this medicine, talk to your doctor, pharmacist, or health care provider.  2023 Elsevier/Gold Standard (2007-10-25 00:00:00)  Gemcitabine Injection What is this medication? GEMCITABINE (jem SYE ta been) treats some types of cancer. It works by slowing down the growth of cancer cells. This medicine may be used for other purposes; ask your health care provider or pharmacist if you have questions. COMMON BRAND NAME(S): Gemzar, Infugem What should I tell my care team before I take this medication? They need to know if you have any of these conditions: Blood disorders Infection Kidney disease Liver disease Lung or breathing disease, such as asthma or COPD Recent or ongoing radiation therapy An unusual or allergic reaction to gemcitabine, other medications, foods, dyes, or preservatives If you or your partner are pregnant or trying to get pregnant Breast-feeding How should I use this medication? This medication is injected into a vein. It is given by your care team in a hospital or clinic setting. Talk to your care team about the use of this medication in children. Special care may be needed. Overdosage: If you think you have taken too much of this medicine contact a poison control center or emergency room at once. NOTE: This medicine is only for you. Do not share this medicine with others. What if I miss a dose? Keep appointments for follow-up doses. It is important not to miss your dose. Call your care team if you are unable to keep an appointment. What may interact with this medication? Interactions have not been studied. This list  may not describe all possible interactions. Give your health care provider a list of all the medicines, herbs, non-prescription drugs, or dietary supplements you use. Also tell them if you smoke, drink alcohol, or use illegal drugs. Some items may interact with your medicine. What should I watch for while using this medication? Your condition will be monitored carefully while you are receiving this medication. This medication may make you feel generally unwell. This is not uncommon, as chemotherapy can affect healthy cells as well as cancer cells. Report any side effects. Continue your course of treatment even though you feel ill unless your care team tells you to stop. In some cases, you may be given additional medications to help with side effects. Follow all directions for their use. This medication may increase your risk of getting an infection. Call your care team for advice if you get a fever, chills, sore throat, or other symptoms of a cold or flu. Do not treat yourself. Try to avoid being around people  who are sick. This medication may increase your risk to bruise or bleed. Call your care team if you notice any unusual bleeding. Be careful brushing or flossing your teeth or using a toothpick because you may get an infection or bleed more easily. If you have any dental work done, tell your dentist you are receiving this medication. Avoid taking medications that contain aspirin, acetaminophen, ibuprofen, naproxen, or ketoprofen unless instructed by your care team. These medications may hide a fever. Talk to your care team if you or your partner wish to become pregnant or think you might be pregnant. This medication can cause serious birth defects if taken during pregnancy and for 6 months after the last dose. A negative pregnancy test is required before starting this medication. A reliable form of contraception is recommended while taking this medication and for 6 months after the last dose. Talk to  your care team about effective forms of contraception. Do not father a child while taking this medication and for 3 months after the last dose. Use a condom while having sex during this time period. Do not breastfeed while taking this medication and for at least 1 week after the last dose. This medication may cause infertility. Talk to your care team if you are concerned about your fertility. What side effects may I notice from receiving this medication? Side effects that you should report to your care team as soon as possible: Allergic reactions--skin rash, itching, hives, swelling of the face, lips, tongue, or throat Capillary leak syndrome--stomach or muscle pain, unusual weakness or fatigue, feeling faint or lightheaded, decrease in the amount of urine, swelling of the ankles, hands, or feet, trouble breathing Infection--fever, chills, cough, sore throat, wounds that don't heal, pain or trouble when passing urine, general feeling of discomfort or being unwell Liver injury--right upper belly pain, loss of appetite, nausea, light-colored stool, dark yellow or brown urine, yellowing skin or eyes, unusual weakness or fatigue Low red blood cell level--unusual weakness or fatigue, dizziness, headache, trouble breathing Lung injury--shortness of breath or trouble breathing, cough, spitting up blood, chest pain, fever Stomach pain, bloody diarrhea, pale skin, unusual weakness or fatigue, decrease in the amount of urine, which may be signs of hemolytic uremic syndrome Sudden and severe headache, confusion, change in vision, seizures, which may be signs of posterior reversible encephalopathy syndrome (PRES) Unusual bruising or bleeding Side effects that usually do not require medical attention (report to your care team if they continue or are bothersome): Diarrhea Drowsiness Hair loss Nausea Pain, redness, or swelling with sores inside the mouth or throat Vomiting This list may not describe all  possible side effects. Call your doctor for medical advice about side effects. You may report side effects to FDA at 1-800-FDA-1088. Where should I keep my medication? This medication is given in a hospital or clinic. It will not be stored at home. NOTE: This sheet is a summary. It may not cover all possible information. If you have questions about this medicine, talk to your doctor, pharmacist, or health care provider.  2023 Elsevier/Gold Standard (2022-01-09 00:00:00)

## 2022-11-09 NOTE — Progress Notes (Signed)
[facility] OFFICE PROGRESS NOTE   Diagnosis: Pancreas cancer  INTERVAL HISTORY:   Kathleen Stone returns as scheduled.  She completed another cycle of gemcitabine/Abraxane on 10/26/2022.  No neuropathy symptoms.  No pain.  She reports a good appetite, but is only drinking liquids and very little solid food.  She reports solid food does not taste good.  Objective:  Vital signs in last 24 hours:  Blood pressure 139/66, pulse 79, temperature 98.2 F (36.8 C), temperature source Oral, resp. rate 18, height '5\' 6"'$  (1.676 m), weight 159 lb 9.6 oz (72.4 kg), last menstrual period 02/07/2016, SpO2 100 %.    HEENT: Mild white coat over the tongue, no buccal thrush Resp: Lungs clear bilaterally Cardio: Regular rate and rhythm GI: Mild tenderness in the mid upper abdomen, no discrete mass, no hepatosplenomegaly Vascular: The left lower leg is slightly larger than the right side, no edema    Portacath/PICC-without erythema  Lab Results:  Lab Results  Component Value Date   WBC 10.4 11/09/2022   HGB 11.8 (L) 11/09/2022   HCT 35.4 (L) 11/09/2022   MCV 96.2 11/09/2022   PLT 185 11/09/2022   NEUTROABS 7.0 11/09/2022    CMP  Lab Results  Component Value Date   NA 141 11/09/2022   K 4.1 11/09/2022   CL 105 11/09/2022   CO2 29 11/09/2022   GLUCOSE 95 11/09/2022   BUN 12 11/09/2022   CREATININE 0.65 11/09/2022   CALCIUM 9.7 11/09/2022   PROT 6.9 11/09/2022   ALBUMIN 3.6 11/09/2022   AST 24 11/09/2022   ALT 22 11/09/2022   ALKPHOS 74 11/09/2022   BILITOT 0.8 11/09/2022   GFRNONAA >60 11/09/2022   GFRAA >60 02/29/2020    Lab Results  Component Value Date   CEA1 27.4 (H) 07/31/2022   [id number] 141 (H) 10/26/2022     Medications: I have reviewed the patient's current medications.   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 CT abdomen/pelvis 07/30/2022-body/neck pancreas mass with peripheral pancreatic atrophy and duct dilation, suspected encasement of the celiac  axis, uterine fibroids MRI abdomen 07/30/2022-3.7 x 4.7 cm pancreas body/tail mass with encasement of the proximal celiac artery, occlusion with collateralization at the confluence of the SMV and portal vein, no evidence of metastatic disease CT chest 08/01/2022-no intrathoracic metastases Elevated CA 19-9 EUS 08/02/2022-gastric polyp, gastric erosions, 32 x 33 mm pancreas body mass, invasion of the celiac trunk, invasion of the splenoportal confluence, no SMA invasion, 2 malignant appearing nodes in the porta hepatis,T4N1 by endoscopic criteria, FNA biopsy-adenocarcinoma 08/17/2022 Guardant360-tumor mutation burden 0.96; MSI high not detected; variant of uncertain clinical significance NTRK3 T93M Cycle 1 gemcitabine/Abraxane 08/17/2022 Cycle 2 gemcitabine/Abraxane 08/31/2022 Cycle 3 gemcitabine/Abraxane 09/14/2022 Cycle 4 gemcitabine/Abraxane 09/28/2022 Cycle 5 gemcitabine/Abraxane 10/12/2022 Cycle 6 gemcitabine/Abraxane 10/26/2022 CT abdomen/pelvis 10/27/2022-stable pancreas body mass with encasement of the celiac axis and portal vein with occlusion of the splenic vein and varices.  Stable porta hepatis lymph nodes. Cycle 7 gemcitabine/Abraxane 11/09/2022   2.  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  Anorexia/weight loss secondary to #1 4.  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  Abdominal pain 6.  History of colon polyps 7.  Family history of multiple cancers Pancreas cancer maternal aunt, maternal cousin, maternal grandfather Breast cancer-maternal aunt, maternal cousin Prostate cancer-maternal uncle Lung cancer-father 65.  Hypertension 9.  Postmenopausal vaginal bleeding 10.  H. pylori 08/02/2022-amoxicillin and Flagyl discontinued 08/21/2022 due to GI symptoms    Disposition: KathleenStone  has completed 6 cycles of gemcitabine/Abraxane.  The CA 19-9 is lower and abdominal pain has resolved.  Her weight has  stabilized.  The restaging CT reveals stable disease.  Presented her case at the GI tumor conference on 11/07/2022.  The tumor is not resectable.  I recommend continuing gemcitabine/Abraxane.  She is tolerating the treatment well.  She will complete another cycle today. Kathleen Stone will return for office visit and chemotherapy in 2 weeks.  Betsy Coder, MD  11/09/2022  10:30 AM

## 2022-11-10 ENCOUNTER — Other Ambulatory Visit: Payer: Self-pay

## 2022-11-10 ENCOUNTER — Other Ambulatory Visit: Payer: Self-pay | Admitting: Oncology

## 2022-11-11 LAB — CANCER ANTIGEN 19-9: CA 19-9: 92 U/mL — ABNORMAL HIGH (ref 0–35)

## 2022-11-12 ENCOUNTER — Telehealth: Payer: Self-pay | Admitting: *Deleted

## 2022-11-12 NOTE — Telephone Encounter (Signed)
Sister reports that Kathleen Stone has had aching in both legs since Sunday afternoon rated 7/10. Took #2 oxycodone yesterday with no relief. Still in pain today (has not taken anything today). Legs do not appear to be swollen or red or discolored. Noted she has not been on her daily prednisone, but plans to resume today.  Also had episode of vomiting last night. No GI pain or fever. Sister is asking she be seen.

## 2022-11-13 ENCOUNTER — Inpatient Hospital Stay: Payer: Commercial Managed Care - HMO

## 2022-11-13 ENCOUNTER — Telehealth: Payer: Self-pay

## 2022-11-13 ENCOUNTER — Inpatient Hospital Stay (HOSPITAL_BASED_OUTPATIENT_CLINIC_OR_DEPARTMENT_OTHER): Payer: Commercial Managed Care - HMO | Admitting: Nurse Practitioner

## 2022-11-13 ENCOUNTER — Encounter: Payer: Self-pay | Admitting: Nurse Practitioner

## 2022-11-13 VITALS — BP 129/65 | HR 100 | Temp 98.1°F | Resp 18 | Ht 66.0 in | Wt 157.0 lb

## 2022-11-13 DIAGNOSIS — C251 Malignant neoplasm of body of pancreas: Secondary | ICD-10-CM | POA: Diagnosis not present

## 2022-11-13 DIAGNOSIS — M79604 Pain in right leg: Secondary | ICD-10-CM | POA: Diagnosis not present

## 2022-11-13 DIAGNOSIS — Z95828 Presence of other vascular implants and grafts: Secondary | ICD-10-CM

## 2022-11-13 DIAGNOSIS — M79605 Pain in left leg: Secondary | ICD-10-CM

## 2022-11-13 DIAGNOSIS — Z5111 Encounter for antineoplastic chemotherapy: Secondary | ICD-10-CM | POA: Diagnosis not present

## 2022-11-13 LAB — CBC WITH DIFFERENTIAL (CANCER CENTER ONLY)
Abs Immature Granulocytes: 0.02 10*3/uL (ref 0.00–0.07)
Basophils Absolute: 0 10*3/uL (ref 0.0–0.1)
Basophils Relative: 1 %
Eosinophils Absolute: 0.1 10*3/uL (ref 0.0–0.5)
Eosinophils Relative: 1 %
HCT: 33.7 % — ABNORMAL LOW (ref 36.0–46.0)
Hemoglobin: 11.1 g/dL — ABNORMAL LOW (ref 12.0–15.0)
Immature Granulocytes: 0 %
Lymphocytes Relative: 15 %
Lymphs Abs: 1.3 10*3/uL (ref 0.7–4.0)
MCH: 32.8 pg (ref 26.0–34.0)
MCHC: 32.9 g/dL (ref 30.0–36.0)
MCV: 99.7 fL (ref 80.0–100.0)
Monocytes Absolute: 0.1 10*3/uL (ref 0.1–1.0)
Monocytes Relative: 1 %
Neutro Abs: 6.6 10*3/uL (ref 1.7–7.7)
Neutrophils Relative %: 82 %
Platelet Count: 249 10*3/uL (ref 150–400)
RBC: 3.38 MIL/uL — ABNORMAL LOW (ref 3.87–5.11)
RDW: 17.9 % — ABNORMAL HIGH (ref 11.5–15.5)
WBC Count: 8.1 10*3/uL (ref 4.0–10.5)
nRBC: 0 % (ref 0.0–0.2)

## 2022-11-13 LAB — CMP (CANCER CENTER ONLY)
ALT: 55 U/L — ABNORMAL HIGH (ref 0–44)
AST: 52 U/L — ABNORMAL HIGH (ref 15–41)
Albumin: 3.6 g/dL (ref 3.5–5.0)
Alkaline Phosphatase: 66 U/L (ref 38–126)
Anion gap: 7 (ref 5–15)
BUN: 13 mg/dL (ref 6–20)
CO2: 28 mmol/L (ref 22–32)
Calcium: 9.7 mg/dL (ref 8.9–10.3)
Chloride: 104 mmol/L (ref 98–111)
Creatinine: 0.56 mg/dL (ref 0.44–1.00)
GFR, Estimated: >60 mL/min (ref >60)
Glucose, Bld: 99 mg/dL (ref 70–99)
Potassium: 4.1 mmol/L (ref 3.5–5.1)
Sodium: 139 mmol/L (ref 135–145)
Total Bilirubin: 1.9 mg/dL — ABNORMAL HIGH (ref 0.3–1.2)
Total Protein: 6.9 g/dL (ref 6.5–8.1)

## 2022-11-13 LAB — CK: Total CK: 20 U/L — ABNORMAL LOW (ref 38–234)

## 2022-11-13 MED ORDER — HEPARIN SOD (PORK) LOCK FLUSH 100 UNIT/ML IV SOLN
500.0000 [IU] | Freq: Once | INTRAVENOUS | Status: AC
  Administered 2022-11-13: 500 [IU] via INTRAVENOUS

## 2022-11-13 MED ORDER — SODIUM CHLORIDE 0.9% FLUSH
10.0000 mL | INTRAVENOUS | Status: DC | PRN
  Administered 2022-11-13: 10 mL via INTRAVENOUS

## 2022-11-13 NOTE — Patient Instructions (Signed)

## 2022-11-13 NOTE — Telephone Encounter (Signed)
Patient's sister gave verbal understanding and had no further questions or concerns at this time. Patient will be enroll  with transportation.

## 2022-11-13 NOTE — Telephone Encounter (Signed)
-----   Message from Owens Shark, NP sent at 11/13/2022  2:39 PM EST ----- Please let her know the lab work from today overall looks good.  Please make a note to call her in the next 1 to 2 days to see how the leg pain is doing.  Thanks

## 2022-11-13 NOTE — Progress Notes (Signed)
[facility] OFFICE PROGRESS NOTE   Diagnosis: Pancreas cancer  INTERVAL HISTORY:   Kathleen Stone returns prior to scheduled follow-up for evaluation of leg pain.  She reports onset of intermittent bilateral leg pain 2 days ago.  She denies any unusual activity or known injury.  She denies back pain.  No bowel or bladder dysfunction.  She describes the pain as "sharp", spanning the entire length of the legs.  The pain occurs intermittently, resolves over about 1 hour.  No numbness or tingling.  No leg weakness.  No swelling.  Objective:  Vital signs in last 24 hours:  Blood pressure 129/65, pulse 100, temperature 98.1 F (36.7 C), temperature source Oral, resp. rate 18, height '5\' 6"'$  (1.676 m), weight 157 lb (71.2 kg), last menstrual period 02/07/2016, SpO2 100 %.    Resp: Lungs clear bilaterally. Cardio: Regular rate and rhythm. GI: Abdomen soft and nontender.  No hepatomegaly. Vascular: No leg edema. Musculoskeletal: Tender with light palpation mainly over the thigh region bilaterally.  Calves soft and nontender. Neuro: Lower extremity motor strength 5/5. Skin: No rash. Port-A-Cath without erythema.  Lab Results:  Lab Results  Component Value Date   WBC 10.4 11/09/2022   HGB 11.8 (L) 11/09/2022   HCT 35.4 (L) 11/09/2022   MCV 96.2 11/09/2022   PLT 185 11/09/2022   NEUTROABS 7.0 11/09/2022    Imaging:  No results found.  Medications: I have reviewed the patient's current medications.  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 CT abdomen/pelvis 07/30/2022-body/neck pancreas mass with peripheral pancreatic atrophy and duct dilation, suspected encasement of the celiac axis, uterine fibroids MRI abdomen 07/30/2022-3.7 x 4.7 cm pancreas body/tail mass with encasement of the proximal celiac artery, occlusion with collateralization at the confluence of the SMV and portal vein, no evidence of metastatic disease CT chest 08/01/2022-no intrathoracic metastases Elevated  CA 19-9 EUS 08/02/2022-gastric polyp, gastric erosions, 32 x 33 mm pancreas body mass, invasion of the celiac trunk, invasion of the splenoportal confluence, no SMA invasion, 2 malignant appearing nodes in the porta hepatis,T4N1 by endoscopic criteria, FNA biopsy-adenocarcinoma 08/17/2022 Guardant360-tumor mutation burden 0.96; MSI high not detected; variant of uncertain clinical significance NTRK3 T93M Cycle 1 gemcitabine/Abraxane 08/17/2022 Cycle 2 gemcitabine/Abraxane 08/31/2022 Cycle 3 gemcitabine/Abraxane 09/14/2022 Cycle 4 gemcitabine/Abraxane 09/28/2022 Cycle 5 gemcitabine/Abraxane 10/12/2022 Cycle 6 gemcitabine/Abraxane 10/26/2022 CT abdomen/pelvis 10/27/2022-stable pancreas body mass with encasement of the celiac axis and portal vein with occlusion of the splenic vein and varices.  Stable porta hepatis lymph nodes. Cycle 7 gemcitabine/Abraxane 11/09/2022   2.  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  Anorexia/weight loss secondary to #1 4.  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  Abdominal pain 6.  History of colon polyps 7.  Family history of multiple cancers Pancreas cancer maternal aunt, maternal cousin, maternal grandfather Breast cancer-maternal aunt, maternal cousin Prostate cancer-maternal uncle Lung cancer-father 69.  Hypertension 9.  Postmenopausal vaginal bleeding 10.  H. pylori 08/02/2022-amoxicillin and Flagyl discontinued 08/21/2022 due to GI symptoms    Disposition: Kathleen Stone appears stable.  She completed cycle 7 Gemcitabine/Abraxane 11/09/2022.  She presents today with a 2-day history of bilateral anterior leg pain/tenderness.  The pain occurs intermittently.  Exam is unremarkable except tenderness to light palpation over the thigh regions.  She denies any unusual activity recently.  She is noted to have Lipitor on her medication list.  She confirms she is not taking this.  We discussed the  possibility of an atypical neuropathy or  rare rhabdomyolysis related to chemotherapy.  We will check labs today to include CBC, chemistry panel, CK.  She will try ibuprofen for the pain.    Plan reviewed with Dr. Benay Spice.    Kathleen Stone ANP/GNP-BC   11/13/2022  10:31 AM

## 2022-11-20 ENCOUNTER — Telehealth: Payer: Self-pay

## 2022-11-20 NOTE — Telephone Encounter (Signed)
Called the patient to check on her legs pain, pt states her legs are just sore not in pain. Pt new issue is her left eye is  blurry and watery. Denies itchy and pain in the eye. I let the patient know I will make the Np aware.

## 2022-11-21 ENCOUNTER — Ambulatory Visit: Payer: Self-pay

## 2022-11-21 ENCOUNTER — Other Ambulatory Visit: Payer: Self-pay

## 2022-11-21 NOTE — Patient Instructions (Signed)
Visit Information  Thank you for taking time to visit with me today. Please don't hesitate to contact me if I can be of assistance to you.   Following are the goals we discussed today:   Goals Addressed             This Visit's Progress    I want to eat more and and gain weight       Patient Goals/self care activities: Maintain a healthy weight Eat mall frequent meals through out the day if possible Stay hydrated Be observant of changes in bowel habits Choose foods that are easy to digest Eat protein rich food Talk with your physician about medications that can help increase your appetite. Continue with her daily regiment        Our next appointment is by telephone on 12/26/22 at 10 am  Please call the care guide team at [phone] if you need to cancel or reschedule your appointment.   If you are experiencing a Mental Health or [place] or need someone to talk to, please call 1-800-273-TALK (toll free, 24 hour hotline)  Patient verbalizes understanding of instructions and care plan provided today and agrees to view in [place]. Active MyChart status and patient understanding of how to access instructions and care plan via MyChart confirmed with patient.      Lazaro Arms RN, BSN, [facility]   Phone: [phone]

## 2022-11-21 NOTE — Patient Outreach (Signed)
  Care Coordination   Follow Up Visit Note   11/21/2022 Name: MIKHAIL GEBEL MRN: [id number] DOB: [birth year]  [name] is a 57 y.o. year old female who sees Masters, Joellen Jersey, DO for primary care. I spoke with  [name] by phone today.  What matters to the patients health and wellness today?  Mrs. Junghans reported feeling strong this morning and having similar eating habits. She continues to consume smoothies 3-4 times a day and has experienced an increase in energy levels. However, she has been experiencing soreness in her legs and blurry vision in both eyes. She has a chemotherapy appointment scheduled for tomorrow. She has been having regular bowel movements and is adhering to her medication regimen. She plans to contact her counselor to schedule a follow-up meeting.    Goals Addressed             This Visit's Progress    I want to eat more and and gain weight       Patient Goals/self care activities: Maintain a healthy weight Eat mall frequent meals through out the day if possible Stay hydrated Be observant of changes in bowel habits Choose foods that are easy to digest Eat protein rich food Talk with your physician about medications that can help increase your appetite. Continue with her daily regiment        SDOH assessments and interventions completed:  No     Care Coordination Interventions:  Yes, provided   Interventions Today    Flowsheet Row Most Recent Value  Chronic Disease   Chronic disease during today's visit Other  [[place]]  General Interventions   General Interventions Discussed/Reviewed General Interventions Discussed  Nutrition Interventions   Nutrition Discussed/Reviewed Nutrition Discussed  Pharmacy Interventions   Pharmacy Dicussed/Reviewed Pharmacy Topics Reviewed        Follow up plan: Follow up call scheduled for 12/26/22  10 am    Encounter Outcome:  Pt. Visit Completed   Lazaro Arms RN, BSN, [facility]   Phone: [phone]

## 2022-11-23 ENCOUNTER — Inpatient Hospital Stay: Payer: Commercial Managed Care - HMO

## 2022-11-23 ENCOUNTER — Inpatient Hospital Stay (HOSPITAL_BASED_OUTPATIENT_CLINIC_OR_DEPARTMENT_OTHER): Payer: Commercial Managed Care - HMO | Admitting: Nurse Practitioner

## 2022-11-23 ENCOUNTER — Inpatient Hospital Stay: Payer: Commercial Managed Care - HMO | Attending: Nurse Practitioner

## 2022-11-23 ENCOUNTER — Encounter: Payer: Self-pay | Admitting: Nurse Practitioner

## 2022-11-23 VITALS — BP 119/62 | HR 75

## 2022-11-23 DIAGNOSIS — R978 Other abnormal tumor markers: Secondary | ICD-10-CM | POA: Diagnosis not present

## 2022-11-23 DIAGNOSIS — C251 Malignant neoplasm of body of pancreas: Secondary | ICD-10-CM

## 2022-11-23 DIAGNOSIS — Z5111 Encounter for antineoplastic chemotherapy: Secondary | ICD-10-CM | POA: Diagnosis not present

## 2022-11-23 LAB — CBC WITH DIFFERENTIAL (CANCER CENTER ONLY)
Abs Immature Granulocytes: 0.06 10*3/uL (ref 0.00–0.07)
Basophils Absolute: 0 10*3/uL (ref 0.0–0.1)
Basophils Relative: 0 %
Eosinophils Absolute: 0.1 10*3/uL (ref 0.0–0.5)
Eosinophils Relative: 1 %
HCT: 37.5 % (ref 36.0–46.0)
Hemoglobin: 12.1 g/dL (ref 12.0–15.0)
Immature Granulocytes: 1 %
Lymphocytes Relative: 28 %
Lymphs Abs: 2.8 10*3/uL (ref 0.7–4.0)
MCH: 32.2 pg (ref 26.0–34.0)
MCHC: 32.3 g/dL (ref 30.0–36.0)
MCV: 99.7 fL (ref 80.0–100.0)
Monocytes Absolute: 0.9 10*3/uL (ref 0.1–1.0)
Monocytes Relative: 9 %
Neutro Abs: 6.1 10*3/uL (ref 1.7–7.7)
Neutrophils Relative %: 61 %
Platelet Count: 212 10*3/uL (ref 150–400)
RBC: 3.76 MIL/uL — ABNORMAL LOW (ref 3.87–5.11)
RDW: 17.6 % — ABNORMAL HIGH (ref 11.5–15.5)
WBC Count: 10 10*3/uL (ref 4.0–10.5)
nRBC: 0 % (ref 0.0–0.2)

## 2022-11-23 LAB — CMP (CANCER CENTER ONLY)
ALT: 25 U/L (ref 0–44)
AST: 28 U/L (ref 15–41)
Albumin: 3.5 g/dL (ref 3.5–5.0)
Alkaline Phosphatase: 78 U/L (ref 38–126)
Anion gap: 6 (ref 5–15)
BUN: 11 mg/dL (ref 6–20)
CO2: 29 mmol/L (ref 22–32)
Calcium: 9.9 mg/dL (ref 8.9–10.3)
Chloride: 105 mmol/L (ref 98–111)
Creatinine: 0.73 mg/dL (ref 0.44–1.00)
GFR, Estimated: >60 mL/min (ref >60)
Glucose, Bld: 143 mg/dL — ABNORMAL HIGH (ref 70–99)
Potassium: 3.8 mmol/L (ref 3.5–5.1)
Sodium: 140 mmol/L (ref 135–145)
Total Bilirubin: 0.6 mg/dL (ref 0.3–1.2)
Total Protein: 6.8 g/dL (ref 6.5–8.1)

## 2022-11-23 MED ORDER — PACLITAXEL PROTEIN-BOUND CHEMO INJECTION 100 MG
100.0000 mg/m2 | Freq: Once | INTRAVENOUS | Status: AC
  Administered 2022-11-23: 200 mg via INTRAVENOUS
  Filled 2022-11-23: qty 40

## 2022-11-23 MED ORDER — HEPARIN SOD (PORK) LOCK FLUSH 100 UNIT/ML IV SOLN
500.0000 [IU] | Freq: Once | INTRAVENOUS | Status: AC | PRN
  Administered 2022-11-23: 500 [IU]

## 2022-11-23 MED ORDER — SODIUM CHLORIDE 0.9 % IV SOLN: Freq: Once | INTRAVENOUS | Status: AC

## 2022-11-23 MED ORDER — PROCHLORPERAZINE MALEATE 10 MG PO TABS
10.0000 mg | ORAL_TABLET | Freq: Once | ORAL | Status: AC
  Administered 2022-11-23: 10 mg via ORAL
  Filled 2022-11-23: qty 1

## 2022-11-23 MED ORDER — SODIUM CHLORIDE 0.9 % IV SOLN
1000.0000 mg/m2 | Freq: Once | INTRAVENOUS | Status: AC
  Administered 2022-11-23: 1786 mg via INTRAVENOUS
  Filled 2022-11-23: qty 26.28

## 2022-11-23 MED ORDER — SODIUM CHLORIDE 0.9% FLUSH
10.0000 mL | INTRAVENOUS | Status: DC | PRN
  Administered 2022-11-23: 10 mL

## 2022-11-23 NOTE — Patient Instructions (Signed)
[facility]   Discharge Instructions: Thank you for choosing [facility] to provide your oncology and hematology care.   If you have a lab appointment with the Cancer Center, please go directly to the Cancer Center and check in at the registration area.   Wear comfortable clothing and clothing appropriate for easy access to any Portacath or PICC line.   We strive to give you quality time with your provider. You may need to reschedule your appointment if you arrive late (15 or more minutes).  Arriving late affects you and other patients whose appointments are after yours.  Also, if you miss three or more appointments without notifying the office, you may be dismissed from the clinic at the provider's discretion.      For prescription refill requests, have your pharmacy contact our office and allow 72 hours for refills to be completed.    Today you received the following chemotherapy and/or immunotherapy agents Abraxane, Gemzar.      To help prevent nausea and vomiting after your treatment, we encourage you to take your nausea medication as directed.  BELOW ARE SYMPTOMS THAT SHOULD BE REPORTED IMMEDIATELY: *FEVER GREATER THAN 100.4 F (38 C) OR HIGHER *CHILLS OR SWEATING *NAUSEA AND VOMITING THAT IS NOT CONTROLLED WITH YOUR NAUSEA MEDICATION *UNUSUAL SHORTNESS OF BREATH *UNUSUAL BRUISING OR BLEEDING *URINARY PROBLEMS (pain or burning when urinating, or frequent urination) *BOWEL PROBLEMS (unusual diarrhea, constipation, pain near the anus) TENDERNESS IN MOUTH AND THROAT WITH OR WITHOUT PRESENCE OF ULCERS (sore throat, sores in mouth, or a toothache) UNUSUAL RASH, SWELLING OR PAIN  UNUSUAL VAGINAL DISCHARGE OR ITCHING   Items with * indicate a potential emergency and should be followed up as soon as possible or go to the Emergency Department if any problems should occur.  Please show the CHEMOTHERAPY ALERT CARD or IMMUNOTHERAPY ALERT CARD  at check-in to the Emergency Department and triage nurse.  Should you have questions after your visit or need to cancel or reschedule your appointment, please contact [facility]  Dept: [phone]  and follow the prompts.  Office hours are 8:00 a.m. to 4:30 p.m. Monday - Friday. Please note that voicemails left after 4:00 p.m. may not be returned until the following business day.  We are closed weekends and major holidays. You have access to a nurse at all times for urgent questions. Please call the main number to the clinic Dept: [phone] and follow the prompts.   For any non-urgent questions, you may also contact your provider using MyChart. We now offer e-Visits for anyone 18 and older to request care online for non-urgent symptoms. For details visit [email or web address].   Also download the MyChart app! Go to the app store, search "MyChart", open the app, select [facility], and log in with your MyChart username and password.  Paclitaxel Nanoparticle Albumin-Bound Injection What is this medication? NANOPARTICLE ALBUMIN-BOUND PACLITAXEL (Na no PAHR ti kuhl al BYOO muhn-bound PAK li TAX el) treats some types of cancer. It works by slowing down the growth of cancer cells. This medicine may be used for other purposes; ask your health care provider or pharmacist if you have questions. COMMON BRAND NAME(S): Abraxane What should I tell my care team before I take this medication? They need to know if you have any of these conditions: Liver disease Low white blood cell levels An unusual or allergic reaction to paclitaxel, albumin, other medications, foods, dyes, or preservatives If   you or your partner are pregnant or trying to get pregnant Breast-feeding How should I use this medication? This medication is injected into a vein. It is given by your care team in a hospital or clinic setting. Talk to your care team about the use of this medication in  children. Special care may be needed. Overdosage: If you think you have taken too much of this medicine contact a poison control center or emergency room at once. NOTE: This medicine is only for you. Do not share this medicine with others. What if I miss a dose? Keep appointments for follow-up doses. It is important not to miss your dose. Call your care team if you are unable to keep an appointment. What may interact with this medication? Other medications may affect the way this medication works. Talk with your care team about all of the medications you take. They may suggest changes to your treatment plan to lower the risk of side effects and to make sure your medications work as intended. This list may not describe all possible interactions. Give your health care provider a list of all the medicines, herbs, non-prescription drugs, or dietary supplements you use. Also tell them if you smoke, drink alcohol, or use illegal drugs. Some items may interact with your medicine. What should I watch for while using this medication? Your condition will be monitored carefully while you are receiving this medication. You may need blood work while taking this medication. This medication may make you feel generally unwell. This is not uncommon as chemotherapy can affect healthy cells as well as cancer cells. Report any side effects. Continue your course of treatment even though you feel ill unless your care team tells you to stop. This medication can cause serious allergic reactions. To reduce the risk, your care team may give you other medications to take before receiving this one. Be sure to follow the directions from your care team. This medication may increase your risk of getting an infection. Call your care team for advice if you get a fever, chills, sore throat, or other symptoms of a cold or flu. Do not treat yourself. Try to avoid being around people who are sick. This medication may increase your risk to  bruise or bleed. Call your care team if you notice any unusual bleeding. Be careful brushing or flossing your teeth or using a toothpick because you may get an infection or bleed more easily. If you have any dental work done, tell your dentist you are receiving this medication. Talk to your care team if you or your partner may be pregnant. Serious birth defects can occur if you take this medication during pregnancy and for 6 months after the last dose. You will need a negative pregnancy test before starting this medication. Contraception is recommended while taking this medication and for 6 months after the last dose. Your care team can help you find the option that works for you. If your partner can get pregnant, use a condom during sex while taking this medication and for 3 months after the last dose. Do not breastfeed while taking this medication and for 2 weeks after the last dose. This medication may cause infertility. Talk to your care team if you are concerned about your fertility. What side effects may I notice from receiving this medication? Side effects that you should report to your care team as soon as possible: Allergic reactions--skin rash, itching, hives, swelling of the face, lips, tongue, or throat Dry cough, shortness   of breath or trouble breathing Infection--fever, chills, cough, sore throat, wounds that don't heal, pain or trouble when passing urine, general feeling of discomfort or being unwell Low red blood cell level--unusual weakness or fatigue, dizziness, headache, trouble breathing Pain, tingling, or numbness in the hands or feet Stomach pain, unusual weakness or fatigue, nausea, vomiting, diarrhea, or fever that lasts longer than expected Unusual bruising or bleeding Side effects that usually do not require medical attention (report to your care team if they continue or are bothersome): Diarrhea Fatigue Hair loss Loss of appetite Nausea Vomiting This list may not  describe all possible side effects. Call your doctor for medical advice about side effects. You may report side effects to FDA at 1-800-FDA-1088. Where should I keep my medication? This medication is given in a hospital or clinic. It will not be stored at home. NOTE: This sheet is a summary. It may not cover all possible information. If you have questions about this medicine, talk to your doctor, pharmacist, or health care provider.  2023 Elsevier/Gold Standard (2007-10-25 00:00:00)  Gemcitabine Injection What is this medication? GEMCITABINE (jem SYE ta been) treats some types of cancer. It works by slowing down the growth of cancer cells. This medicine may be used for other purposes; ask your health care provider or pharmacist if you have questions. COMMON BRAND NAME(S): Gemzar, Infugem What should I tell my care team before I take this medication? They need to know if you have any of these conditions: Blood disorders Infection Kidney disease Liver disease Lung or breathing disease, such as asthma or COPD Recent or ongoing radiation therapy An unusual or allergic reaction to gemcitabine, other medications, foods, dyes, or preservatives If you or your partner are pregnant or trying to get pregnant Breast-feeding How should I use this medication? This medication is injected into a vein. It is given by your care team in a hospital or clinic setting. Talk to your care team about the use of this medication in children. Special care may be needed. Overdosage: If you think you have taken too much of this medicine contact a poison control center or emergency room at once. NOTE: This medicine is only for you. Do not share this medicine with others. What if I miss a dose? Keep appointments for follow-up doses. It is important not to miss your dose. Call your care team if you are unable to keep an appointment. What may interact with this medication? Interactions have not been studied. This list  may not describe all possible interactions. Give your health care provider a list of all the medicines, herbs, non-prescription drugs, or dietary supplements you use. Also tell them if you smoke, drink alcohol, or use illegal drugs. Some items may interact with your medicine. What should I watch for while using this medication? Your condition will be monitored carefully while you are receiving this medication. This medication may make you feel generally unwell. This is not uncommon, as chemotherapy can affect healthy cells as well as cancer cells. Report any side effects. Continue your course of treatment even though you feel ill unless your care team tells you to stop. In some cases, you may be given additional medications to help with side effects. Follow all directions for their use. This medication may increase your risk of getting an infection. Call your care team for advice if you get a fever, chills, sore throat, or other symptoms of a cold or flu. Do not treat yourself. Try to avoid being around people   who are sick. This medication may increase your risk to bruise or bleed. Call your care team if you notice any unusual bleeding. Be careful brushing or flossing your teeth or using a toothpick because you may get an infection or bleed more easily. If you have any dental work done, tell your dentist you are receiving this medication. Avoid taking medications that contain aspirin, acetaminophen, ibuprofen, naproxen, or ketoprofen unless instructed by your care team. These medications may hide a fever. Talk to your care team if you or your partner wish to become pregnant or think you might be pregnant. This medication can cause serious birth defects if taken during pregnancy and for 6 months after the last dose. A negative pregnancy test is required before starting this medication. A reliable form of contraception is recommended while taking this medication and for 6 months after the last dose. Talk to  your care team about effective forms of contraception. Do not father a child while taking this medication and for 3 months after the last dose. Use a condom while having sex during this time period. Do not breastfeed while taking this medication and for at least 1 week after the last dose. This medication may cause infertility. Talk to your care team if you are concerned about your fertility. What side effects may I notice from receiving this medication? Side effects that you should report to your care team as soon as possible: Allergic reactions--skin rash, itching, hives, swelling of the face, lips, tongue, or throat Capillary leak syndrome--stomach or muscle pain, unusual weakness or fatigue, feeling faint or lightheaded, decrease in the amount of urine, swelling of the ankles, hands, or feet, trouble breathing Infection--fever, chills, cough, sore throat, wounds that don't heal, pain or trouble when passing urine, general feeling of discomfort or being unwell Liver injury--right upper belly pain, loss of appetite, nausea, light-colored stool, dark yellow or brown urine, yellowing skin or eyes, unusual weakness or fatigue Low red blood cell level--unusual weakness or fatigue, dizziness, headache, trouble breathing Lung injury--shortness of breath or trouble breathing, cough, spitting up blood, chest pain, fever Stomach pain, bloody diarrhea, pale skin, unusual weakness or fatigue, decrease in the amount of urine, which may be signs of hemolytic uremic syndrome Sudden and severe headache, confusion, change in vision, seizures, which may be signs of posterior reversible encephalopathy syndrome (PRES) Unusual bruising or bleeding Side effects that usually do not require medical attention (report to your care team if they continue or are bothersome): Diarrhea Drowsiness Hair loss Nausea Pain, redness, or swelling with sores inside the mouth or throat Vomiting This list may not describe all  possible side effects. Call your doctor for medical advice about side effects. You may report side effects to FDA at 1-800-FDA-1088. Where should I keep my medication? This medication is given in a hospital or clinic. It will not be stored at home. NOTE: This sheet is a summary. It may not cover all possible information. If you have questions about this medicine, talk to your doctor, pharmacist, or health care provider.  2023 Elsevier/Gold Standard (2022-01-09 00:00:00)  

## 2022-11-23 NOTE — Progress Notes (Signed)
  [facility] OFF[facility] PROGRESS NOTE   Diagnosis: Pancreas cancer  INTERVAL HISTORY:   Ms. Murfin returns as scheduled.  She completed another cycle of gemcitabine/Abraxane 11/09/2022.  She feels well.  No significant leg pain.  She denies nausea/vomiting.  No mouth sores.  No diarrhea.  No numbness or tingling in the hands or feet.  No fever or rash after treatment.  Appetite overall good.  Weight is stable.  She denies abdominal pain.  Objective:  Vital signs in last 24 hours:  Blood pressure 134/67, pulse 100, temperature 98.2 F (36.8 C), temperature source Oral, resp. rate 18, height 5\' 6"  (1.676 m), weight 158 lb 3.2 oz (71.8 kg), last menstrual period 02/07/2016, SpO2 100 %.    HEENT: No thrush or ulcers. Resp: Lungs clear bilaterally. Cardio: Regular rate and rhythm. GI: Abdomen soft and nontender.  No hepatosplenomegaly.  No mass. Vascular: No leg edema. Skin: No rash. Port-A-Cath without erythema.  Lab Results:  Lab Results  Component Value Date   WBC 10.0 11/23/2022   HGB 12.1 11/23/2022   HCT 37.5 11/23/2022   MCV 99.7 11/23/2022   PLT 212 11/23/2022   NEUTROABS 6.1 11/23/2022    Imaging:  No results found.  Medications: I have reviewed the patient's current medications.  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 CT abdomen/pelvis 07/30/2022-body/neck pancreas mass with peripheral pancreatic atrophy and duct dilation, suspected encasement of the celiac axis, uterine fibroids MRI abdomen 07/30/2022-3.7 x 4.7 cm pancreas body/tail mass with encasement of the proximal celiac artery, occlusion with collateralization at the confluence of the SMV and portal vein, no evidence of metastatic disease CT chest 08/01/2022-no intrathoracic metastases Elevated CA 19-9 EUS 08/02/2022-gastric polyp, gastric erosions, 32 x 33 mm pancreas body mass, invasion of the celiac trunk, invasion of the splenoportal confluence, no SMA invasion, 2 malignant appearing nodes in  the porta hepatis,T4N1 by endoscopic criteria, FNA biopsy-adenocarcinoma 08/17/2022 [facility]-tumor mutation burden 0.96; MSI high not detected; variant of uncertain clinical significance NTRK3 T93M Cycle 1 gemcitabine/Abraxane 08/17/2022 Cycle 2 gemcitabine/Abraxane 08/31/2022 Cycle 3 gemcitabine/Abraxane 09/14/2022 Cycle 4 gemcitabine/Abraxane 09/28/2022 Cycle 5 gemcitabine/Abraxane 10/12/2022 Cycle 6 gemcitabine/Abraxane 10/26/2022 CT abdomen/pelvis 10/27/2022-stable pancreas body mass with encasement of the celiac axis and portal vein with occlusion of the splenic vein and varices.  Stable porta hepatis lymph nodes. Cycle 7 gemcitabine/Abraxane 11/09/2022 Cycle 8 gemcitabine/Abraxane 11/23/2022   2.  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  Anorexia/weight loss secondary to #1 4.  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  Abdominal pain 6.  History of colon polyps 7.  Family history of multiple cancers Pancreas cancer maternal aunt, maternal cousin, maternal grandfather Breast cancer-maternal aunt, maternal cousin Prostate cancer-maternal uncle Lung cancer-father 35.  Hypertension 9.  Postmenopausal vaginal bleeding 10.  H. pylori 08/02/2022-amoxicillin and Flagyl discontinued 08/21/2022 due to GI symptoms    Disposition: Ms. Aday appears stable.  She has completed 7 cycles of gemcitabine/Abraxane.  There is no clinical evidence of disease progression.  Most recent CA 19-9 tumor marker further improved.  Plan to proceed with cycle 8 gemcitabine/Abraxane today as scheduled.  CBC and chemistry panel reviewed.  Labs adequate to proceed as above.  She will return for lab, follow-up, chemotherapy in 2 weeks.  We are available to see her sooner if needed.    Ned Card ANP/GNP-BC   11/23/2022  9:03 AM

## 2022-11-23 NOTE — Progress Notes (Signed)
Patient seen by Lisa Thomas NP today  Vitals are within treatment parameters.  Labs reviewed by Lisa Thomas NP and are within treatment parameters.  Per physician team, patient is ready for treatment and there are NO modifications to the treatment plan.     

## 2022-11-25 LAB — CANCER ANTIGEN 19-9: CA 19-9: 54 U/mL — ABNORMAL HIGH (ref 0–35)

## 2022-12-02 ENCOUNTER — Other Ambulatory Visit: Payer: Self-pay | Admitting: Oncology

## 2022-12-07 ENCOUNTER — Inpatient Hospital Stay: Payer: Commercial Managed Care - HMO

## 2022-12-07 ENCOUNTER — Inpatient Hospital Stay (HOSPITAL_BASED_OUTPATIENT_CLINIC_OR_DEPARTMENT_OTHER): Payer: Commercial Managed Care - HMO | Admitting: Oncology

## 2022-12-07 ENCOUNTER — Encounter: Payer: Self-pay | Admitting: *Deleted

## 2022-12-07 VITALS — BP 142/80 | HR 90 | Temp 97.9°F | Resp 18 | Ht 66.0 in | Wt 157.8 lb

## 2022-12-07 VITALS — BP 127/69 | HR 63 | Resp 20

## 2022-12-07 DIAGNOSIS — Z5111 Encounter for antineoplastic chemotherapy: Secondary | ICD-10-CM | POA: Diagnosis not present

## 2022-12-07 DIAGNOSIS — C251 Malignant neoplasm of body of pancreas: Secondary | ICD-10-CM

## 2022-12-07 LAB — CMP (CANCER CENTER ONLY)
ALT: 22 U/L (ref 0–44)
AST: 25 U/L (ref 15–41)
Albumin: 3.5 g/dL (ref 3.5–5.0)
Alkaline Phosphatase: 69 U/L (ref 38–126)
Anion gap: 4 — ABNORMAL LOW (ref 5–15)
BUN: 16 mg/dL (ref 6–20)
CO2: 30 mmol/L (ref 22–32)
Calcium: 9.7 mg/dL (ref 8.9–10.3)
Chloride: 107 mmol/L (ref 98–111)
Creatinine: 0.73 mg/dL (ref 0.44–1.00)
GFR, Estimated: >60 mL/min (ref >60)
Glucose, Bld: 129 mg/dL — ABNORMAL HIGH (ref 70–99)
Potassium: 3.7 mmol/L (ref 3.5–5.1)
Sodium: 141 mmol/L (ref 135–145)
Total Bilirubin: 0.4 mg/dL (ref 0.3–1.2)
Total Protein: 6.6 g/dL (ref 6.5–8.1)

## 2022-12-07 LAB — CBC WITH DIFFERENTIAL (CANCER CENTER ONLY)
Abs Immature Granulocytes: 0.02 10*3/uL (ref 0.00–0.07)
Basophils Absolute: 0 10*3/uL (ref 0.0–0.1)
Basophils Relative: 0 %
Eosinophils Absolute: 0.1 10*3/uL (ref 0.0–0.5)
Eosinophils Relative: 2 %
HCT: 36.5 % (ref 36.0–46.0)
Hemoglobin: 11.9 g/dL — ABNORMAL LOW (ref 12.0–15.0)
Immature Granulocytes: 0 %
Lymphocytes Relative: 31 %
Lymphs Abs: 2.5 10*3/uL (ref 0.7–4.0)
MCH: 32.7 pg (ref 26.0–34.0)
MCHC: 32.6 g/dL (ref 30.0–36.0)
MCV: 100.3 fL — ABNORMAL HIGH (ref 80.0–100.0)
Monocytes Absolute: 0.8 10*3/uL (ref 0.1–1.0)
Monocytes Relative: 9 %
Neutro Abs: 4.8 10*3/uL (ref 1.7–7.7)
Neutrophils Relative %: 58 %
Platelet Count: 207 10*3/uL (ref 150–400)
RBC: 3.64 MIL/uL — ABNORMAL LOW (ref 3.87–5.11)
RDW: 16.4 % — ABNORMAL HIGH (ref 11.5–15.5)
WBC Count: 8.3 10*3/uL (ref 4.0–10.5)
nRBC: 0 % (ref 0.0–0.2)

## 2022-12-07 MED ORDER — PACLITAXEL PROTEIN-BOUND CHEMO INJECTION 100 MG
100.0000 mg/m2 | Freq: Once | INTRAVENOUS | Status: AC
  Administered 2022-12-07: 200 mg via INTRAVENOUS
  Filled 2022-12-07: qty 40

## 2022-12-07 MED ORDER — SODIUM CHLORIDE 0.9% FLUSH
10.0000 mL | INTRAVENOUS | Status: DC | PRN
  Administered 2022-12-07: 10 mL

## 2022-12-07 MED ORDER — SODIUM CHLORIDE 0.9 % IV SOLN
1000.0000 mg/m2 | Freq: Once | INTRAVENOUS | Status: AC
  Administered 2022-12-07: 1786 mg via INTRAVENOUS
  Filled 2022-12-07: qty 26.28

## 2022-12-07 MED ORDER — HEPARIN SOD (PORK) LOCK FLUSH 100 UNIT/ML IV SOLN
500.0000 [IU] | Freq: Once | INTRAVENOUS | Status: AC | PRN
  Administered 2022-12-07: 500 [IU]

## 2022-12-07 MED ORDER — PROCHLORPERAZINE MALEATE 10 MG PO TABS
10.0000 mg | ORAL_TABLET | Freq: Once | ORAL | Status: AC
  Administered 2022-12-07: 10 mg via ORAL
  Filled 2022-12-07: qty 1

## 2022-12-07 MED ORDER — SODIUM CHLORIDE 0.9 % IV SOLN: Freq: Once | INTRAVENOUS | Status: AC

## 2022-12-07 NOTE — Progress Notes (Signed)
[facility] OFFICE PROGRESS NOTE   Diagnosis: Pancreas cancer  INTERVAL HISTORY:   Kathleen Stone completed another cycle of gemcitabine/Abraxane on 11/23/2022.  No fever, rash, or neuropathy symptoms.  No pain.  She continues to have altered taste.  She drinks 4-5 ensures per day.  Objective:  Vital signs in last 24 hours:  Blood pressure (!) 142/80, pulse 90, temperature 97.9 F (36.6 C), temperature source Oral, resp. rate 18, height 5\' 6"  (1.676 m), weight 157 lb 12.8 oz (71.6 kg), last menstrual period 02/07/2016, SpO2 100 %.    HEENT: Mild white coat over the tongue, no buccal thrush Resp: Lungs clear bilaterally Cardio: Regular rate and rhythm GI: Nontender, no mass, no hepatosplenomegaly Vascular: No leg edema, the left lower leg is slightly larger than right side.  No erythema or tenderness.   Portacath/PICC-without erythema  Lab Results:  Lab Results  Component Value Date   WBC 8.3 12/07/2022   HGB 11.9 (L) 12/07/2022   HCT 36.5 12/07/2022   MCV 100.3 (H) 12/07/2022   PLT 207 12/07/2022   NEUTROABS 4.8 12/07/2022    CMP  Lab Results  Component Value Date   NA 140 11/23/2022   K 3.8 11/23/2022   CL 105 11/23/2022   CO2 29 11/23/2022   GLUCOSE 143 (H) 11/23/2022   BUN 11 11/23/2022   CREATININE 0.73 11/23/2022   CALCIUM 9.9 11/23/2022   PROT 6.8 11/23/2022   ALBUMIN 3.5 11/23/2022   AST 28 11/23/2022   ALT 25 11/23/2022   ALKPHOS 78 11/23/2022   BILITOT 0.6 11/23/2022   GFRNONAA >60 11/23/2022   GFRAA >60 02/29/2020    Lab Results  Component Value Date   CEA1 27.4 (H) 07/31/2022   [id number] (H) 11/23/2022     Medications: I have reviewed the patient's current medications.   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 CT abdomen/pelvis 07/30/2022-body/neck pancreas mass with peripheral pancreatic atrophy and duct dilation, suspected encasement of the celiac axis, uterine fibroids MRI abdomen 07/30/2022-3.7 x 4.7 cm pancreas body/tail  mass with encasement of the proximal celiac artery, occlusion with collateralization at the confluence of the SMV and portal vein, no evidence of metastatic disease CT chest 08/01/2022-no intrathoracic metastases Elevated CA 19-9 EUS 08/02/2022-gastric polyp, gastric erosions, 32 x 33 mm pancreas body mass, invasion of the celiac trunk, invasion of the splenoportal confluence, no SMA invasion, 2 malignant appearing nodes in the porta hepatis,T4N1 by endoscopic criteria, FNA biopsy-adenocarcinoma 08/17/2022 [facility]-tumor mutation burden 0.96; MSI high not detected; variant of uncertain clinical significance NTRK3 T93M Cycle 1 gemcitabine/Abraxane 08/17/2022 Cycle 2 gemcitabine/Abraxane 08/31/2022 Cycle 3 gemcitabine/Abraxane 09/14/2022 Cycle 4 gemcitabine/Abraxane 09/28/2022 Cycle 5 gemcitabine/Abraxane 10/12/2022 Cycle 6 gemcitabine/Abraxane 10/26/2022 CT abdomen/pelvis 10/27/2022-stable pancreas body mass with encasement of the celiac axis and portal vein with occlusion of the splenic vein and varices.  Stable porta hepatis lymph nodes. Cycle 7 gemcitabine/Abraxane 11/09/2022 Cycle 8 gemcitabine/Abraxane 11/23/2022 9 gemcitabine/Abraxane 12/07/2022   2.  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  Anorexia/weight loss secondary to #1 4.  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  Abdominal pain 6.  History of colon polyps 7.  Family history of multiple cancers Pancreas cancer maternal aunt, maternal cousin, maternal grandfather Breast cancer-maternal aunt, maternal cousin Prostate cancer-maternal uncle Lung cancer-father 67.  Hypertension 9.  Postmenopausal vaginal bleeding 10.  H. pylori 08/02/2022-amoxicillin and Flagyl discontinued 08/21/2022 due to GI symptoms      Disposition: Ms. Schmuck has completed 8 cycles  of gemcitabine/Abraxane.  Her performance status has improved.  The CA 19-9 is lower.  She will complete  another cycle of gemcitabine/Abraxane today.  She is tolerating the chemotherapy well.  Ms. Tillery will return for an office visit and chemotherapy in 2 weeks.  Betsy Coder, MD  12/07/2022  9:52 AM

## 2022-12-07 NOTE — Progress Notes (Signed)
Provided patient 1 case of Ensure today

## 2022-12-07 NOTE — Patient Instructions (Signed)
[facility]   Discharge Instructions: Thank you for choosing [facility] to provide your oncology and hematology care.   If you have a lab appointment with the [facility], please go directly to the [facility] and check in at the registration area.   Wear comfortable clothing and clothing appropriate for easy access to any Portacath or PICC line.   We strive to give you quality time with your provider. You may need to reschedule your appointment if you arrive late (15 or more minutes).  Arriving late affects you and other patients whose appointments are after yours.  Also, if you miss three or more appointments without notifying the office, you may be dismissed from the clinic at the provider's discretion.      For prescription refill requests, have your pharmacy contact our office and allow 72 hours for refills to be completed.    Today you received the following chemotherapy and/or immunotherapy agents Paclitaxel-protein bound (ABRAXANE) & Gemcitabine (GEMZAR).      To help prevent nausea and vomiting after your treatment, we encourage you to take your nausea medication as directed.  BELOW ARE SYMPTOMS THAT SHOULD BE REPORTED IMMEDIATELY: *FEVER GREATER THAN 100.4 F (38 C) OR HIGHER *CHILLS OR SWEATING *NAUSEA AND VOMITING THAT IS NOT CONTROLLED WITH YOUR NAUSEA MEDICATION *UNUSUAL SHORTNESS OF BREATH *UNUSUAL BRUISING OR BLEEDING *URINARY PROBLEMS (pain or burning when urinating, or frequent urination) *BOWEL PROBLEMS (unusual diarrhea, constipation, pain near the anus) TENDERNESS IN MOUTH AND THROAT WITH OR WITHOUT PRESENCE OF ULCERS (sore throat, sores in mouth, or a toothache) UNUSUAL RASH, SWELLING OR PAIN  UNUSUAL VAGINAL DISCHARGE OR ITCHING   Items with * indicate a potential emergency and should be followed up as soon as possible or go to the Emergency Department if any problems should occur.  Please show the  CHEMOTHERAPY ALERT CARD or IMMUNOTHERAPY ALERT CARD at check-in to the Emergency Department and triage nurse.  Should you have questions after your visit or need to cancel or reschedule your appointment, please contact [facility]  Dept: [phone]  and follow the prompts.  Office hours are 8:00 a.m. to 4:30 p.m. Monday - Friday. Please note that voicemails left after 4:00 p.m. may not be returned until the following business day.  We are closed weekends and major holidays. You have access to a nurse at all times for urgent questions. Please call the main number to the clinic Dept: [phone] and follow the prompts.   For any non-urgent questions, you may also contact your provider using MyChart. We now offer e-Visits for anyone 28 and older to request care online for non-urgent symptoms. For details visit [email or web address].   Also download the MyChart app! Go to the app store, search "MyChart", open the app, select [facility], and log in with your MyChart username and password.  Paclitaxel Nanoparticle Albumin-Bound Injection What is this medication? NANOPARTICLE ALBUMIN-BOUND PACLITAXEL (Na no PAHR ti kuhl al BYOO muhn-bound PAK li TAX el) treats some types of cancer. It works by slowing down the growth of cancer cells. This medicine may be used for other purposes; ask your health care provider or pharmacist if you have questions. COMMON BRAND NAME(S): Abraxane What should I tell my care team before I take this medication? They need to know if you have any of these conditions: Liver disease Low white blood cell levels An unusual or allergic reaction to paclitaxel, albumin, other medications, foods,  dyes, or preservatives If you or your partner are pregnant or trying to get pregnant Breast-feeding How should I use this medication? This medication is injected into a vein. It is given by your care team in a hospital or clinic setting. Talk to your  care team about the use of this medication in children. Special care may be needed. Overdosage: If you think you have taken too much of this medicine contact a poison control center or emergency room at once. NOTE: This medicine is only for you. Do not share this medicine with others. What if I miss a dose? Keep appointments for follow-up doses. It is important not to miss your dose. Call your care team if you are unable to keep an appointment. What may interact with this medication? Other medications may affect the way this medication works. Talk with your care team about all of the medications you take. They may suggest changes to your treatment plan to lower the risk of side effects and to make sure your medications work as intended. This list may not describe all possible interactions. Give your health care provider a list of all the medicines, herbs, non-prescription drugs, or dietary supplements you use. Also tell them if you smoke, drink alcohol, or use illegal drugs. Some items may interact with your medicine. What should I watch for while using this medication? Your condition will be monitored carefully while you are receiving this medication. You may need blood work while taking this medication. This medication may make you feel generally unwell. This is not uncommon as chemotherapy can affect healthy cells as well as cancer cells. Report any side effects. Continue your course of treatment even though you feel ill unless your care team tells you to stop. This medication can cause serious allergic reactions. To reduce the risk, your care team may give you other medications to take before receiving this one. Be sure to follow the directions from your care team. This medication may increase your risk of getting an infection. Call your care team for advice if you get a fever, chills, sore throat, or other symptoms of a cold or flu. Do not treat yourself. Try to avoid being around people who are  sick. This medication may increase your risk to bruise or bleed. Call your care team if you notice any unusual bleeding. Be careful brushing or flossing your teeth or using a toothpick because you may get an infection or bleed more easily. If you have any dental work done, tell your dentist you are receiving this medication. Talk to your care team if you or your partner may be pregnant. Serious birth defects can occur if you take this medication during pregnancy and for 6 months after the last dose. You will need a negative pregnancy test before starting this medication. Contraception is recommended while taking this medication and for 6 months after the last dose. Your care team can help you find the option that works for you. If your partner can get pregnant, use a condom during sex while taking this medication and for 3 months after the last dose. Do not breastfeed while taking this medication and for 2 weeks after the last dose. This medication may cause infertility. Talk to your care team if you are concerned about your fertility. What side effects may I notice from receiving this medication? Side effects that you should report to your care team as soon as possible: Allergic reactions--skin rash, itching, hives, swelling of the face, lips, tongue, or   throat Dry cough, shortness of breath or trouble breathing Infection--fever, chills, cough, sore throat, wounds that don't heal, pain or trouble when passing urine, general feeling of discomfort or being unwell Low red blood cell level--unusual weakness or fatigue, dizziness, headache, trouble breathing Pain, tingling, or numbness in the hands or feet Stomach pain, unusual weakness or fatigue, nausea, vomiting, diarrhea, or fever that lasts longer than expected Unusual bruising or bleeding Side effects that usually do not require medical attention (report to your care team if they continue or are bothersome): Diarrhea Fatigue Hair loss Loss of  appetite Nausea Vomiting This list may not describe all possible side effects. Call your doctor for medical advice about side effects. You may report side effects to FDA at 1-800-FDA-1088. Where should I keep my medication? This medication is given in a hospital or clinic. It will not be stored at home. NOTE: This sheet is a summary. It may not cover all possible information. If you have questions about this medicine, talk to your doctor, pharmacist, or health care provider.  2023 Elsevier/Gold Standard (2007-10-25 00:00:00)  Gemcitabine Injection What is this medication? GEMCITABINE (jem SYE ta been) treats some types of cancer. It works by slowing down the growth of cancer cells. This medicine may be used for other purposes; ask your health care provider or pharmacist if you have questions. COMMON BRAND NAME(S): Gemzar, Infugem What should I tell my care team before I take this medication? They need to know if you have any of these conditions: Blood disorders Infection Kidney disease Liver disease Lung or breathing disease, such as asthma or COPD Recent or ongoing radiation therapy An unusual or allergic reaction to gemcitabine, other medications, foods, dyes, or preservatives If you or your partner are pregnant or trying to get pregnant Breast-feeding How should I use this medication? This medication is injected into a vein. It is given by your care team in a hospital or clinic setting. Talk to your care team about the use of this medication in children. Special care may be needed. Overdosage: If you think you have taken too much of this medicine contact a poison control center or emergency room at once. NOTE: This medicine is only for you. Do not share this medicine with others. What if I miss a dose? Keep appointments for follow-up doses. It is important not to miss your dose. Call your care team if you are unable to keep an appointment. What may interact with this  medication? Interactions have not been studied. This list may not describe all possible interactions. Give your health care provider a list of all the medicines, herbs, non-prescription drugs, or dietary supplements you use. Also tell them if you smoke, drink alcohol, or use illegal drugs. Some items may interact with your medicine. What should I watch for while using this medication? Your condition will be monitored carefully while you are receiving this medication. This medication may make you feel generally unwell. This is not uncommon, as chemotherapy can affect healthy cells as well as cancer cells. Report any side effects. Continue your course of treatment even though you feel ill unless your care team tells you to stop. In some cases, you may be given additional medications to help with side effects. Follow all directions for their use. This medication may increase your risk of getting an infection. Call your care team for advice if you get a fever, chills, sore throat, or other symptoms of a cold or flu. Do not treat yourself. Try to  avoid being around people who are sick. This medication may increase your risk to bruise or bleed. Call your care team if you notice any unusual bleeding. Be careful brushing or flossing your teeth or using a toothpick because you may get an infection or bleed more easily. If you have any dental work done, tell your dentist you are receiving this medication. Avoid taking medications that contain aspirin, acetaminophen, ibuprofen, naproxen, or ketoprofen unless instructed by your care team. These medications may hide a fever. Talk to your care team if you or your partner wish to become pregnant or think you might be pregnant. This medication can cause serious birth defects if taken during pregnancy and for 6 months after the last dose. A negative pregnancy test is required before starting this medication. A reliable form of contraception is recommended while taking  this medication and for 6 months after the last dose. Talk to your care team about effective forms of contraception. Do not father a child while taking this medication and for 3 months after the last dose. Use a condom while having sex during this time period. Do not breastfeed while taking this medication and for at least 1 week after the last dose. This medication may cause infertility. Talk to your care team if you are concerned about your fertility. What side effects may I notice from receiving this medication? Side effects that you should report to your care team as soon as possible: Allergic reactions--skin rash, itching, hives, swelling of the face, lips, tongue, or throat Capillary leak syndrome--stomach or muscle pain, unusual weakness or fatigue, feeling faint or lightheaded, decrease in the amount of urine, swelling of the ankles, hands, or feet, trouble breathing Infection--fever, chills, cough, sore throat, wounds that don't heal, pain or trouble when passing urine, general feeling of discomfort or being unwell Liver injury--right upper belly pain, loss of appetite, nausea, light-colored stool, dark yellow or brown urine, yellowing skin or eyes, unusual weakness or fatigue Low red blood cell level--unusual weakness or fatigue, dizziness, headache, trouble breathing Lung injury--shortness of breath or trouble breathing, cough, spitting up blood, chest pain, fever Stomach pain, bloody diarrhea, pale skin, unusual weakness or fatigue, decrease in the amount of urine, which may be signs of hemolytic uremic syndrome Sudden and severe headache, confusion, change in vision, seizures, which may be signs of posterior reversible encephalopathy syndrome (PRES) Unusual bruising or bleeding Side effects that usually do not require medical attention (report to your care team if they continue or are bothersome): Diarrhea Drowsiness Hair loss Nausea Pain, redness, or swelling with sores inside the  mouth or throat Vomiting This list may not describe all possible side effects. Call your doctor for medical advice about side effects. You may report side effects to FDA at 1-800-FDA-1088. Where should I keep my medication? This medication is given in a hospital or clinic. It will not be stored at home. NOTE: This sheet is a summary. It may not cover all possible information. If you have questions about this medicine, talk to your doctor, pharmacist, or health care provider.  2023 Elsevier/Gold Standard (2022-01-09 00:00:00)

## 2022-12-07 NOTE — Progress Notes (Signed)
Patient seen by Dr. Sherrill today  Vitals are within treatment parameters.  Labs reviewed by Dr. Sherrill CBC diff reviewed and within treatment parameters, CMP pending.  Per physician team, patient is ready for treatment and there are NO modifications to the treatment plan.  

## 2022-12-08 LAB — CANCER ANTIGEN 19-9: CA 19-9: 38 U/mL — ABNORMAL HIGH (ref 0–35)

## 2022-12-16 ENCOUNTER — Other Ambulatory Visit: Payer: Self-pay | Admitting: Oncology

## 2022-12-18 ENCOUNTER — Other Ambulatory Visit: Payer: Self-pay

## 2022-12-21 ENCOUNTER — Inpatient Hospital Stay: Payer: Commercial Managed Care - HMO

## 2022-12-21 ENCOUNTER — Inpatient Hospital Stay: Payer: Commercial Managed Care - HMO | Attending: Nurse Practitioner

## 2022-12-21 ENCOUNTER — Encounter: Payer: Self-pay | Admitting: Nurse Practitioner

## 2022-12-21 ENCOUNTER — Ambulatory Visit: Payer: Commercial Managed Care - HMO | Admitting: Nutrition

## 2022-12-21 ENCOUNTER — Inpatient Hospital Stay (HOSPITAL_BASED_OUTPATIENT_CLINIC_OR_DEPARTMENT_OTHER): Payer: Commercial Managed Care - HMO | Admitting: Nurse Practitioner

## 2022-12-21 VITALS — BP 105/70 | HR 60 | Resp 18

## 2022-12-21 VITALS — BP 117/63 | HR 70 | Temp 97.7°F | Resp 18 | Wt 155.8 lb

## 2022-12-21 DIAGNOSIS — Z8719 Personal history of other diseases of the digestive system: Secondary | ICD-10-CM | POA: Insufficient documentation

## 2022-12-21 DIAGNOSIS — C251 Malignant neoplasm of body of pancreas: Secondary | ICD-10-CM | POA: Diagnosis present

## 2022-12-21 DIAGNOSIS — Z5111 Encounter for antineoplastic chemotherapy: Secondary | ICD-10-CM | POA: Diagnosis present

## 2022-12-21 DIAGNOSIS — Z8042 Family history of malignant neoplasm of prostate: Secondary | ICD-10-CM | POA: Insufficient documentation

## 2022-12-21 DIAGNOSIS — Z803 Family history of malignant neoplasm of breast: Secondary | ICD-10-CM | POA: Insufficient documentation

## 2022-12-21 DIAGNOSIS — Z800 Family history of malignant neoplasm of digestive organs: Secondary | ICD-10-CM | POA: Insufficient documentation

## 2022-12-21 DIAGNOSIS — Z801 Family history of malignant neoplasm of trachea, bronchus and lung: Secondary | ICD-10-CM | POA: Insufficient documentation

## 2022-12-21 LAB — CBC WITH DIFFERENTIAL (CANCER CENTER ONLY)
Abs Immature Granulocytes: 0.04 10*3/uL (ref 0.00–0.07)
Basophils Absolute: 0 10*3/uL (ref 0.0–0.1)
Basophils Relative: 0 %
Eosinophils Absolute: 0.1 10*3/uL (ref 0.0–0.5)
Eosinophils Relative: 1 %
HCT: 37.1 % (ref 36.0–46.0)
Hemoglobin: 12.2 g/dL (ref 12.0–15.0)
Immature Granulocytes: 0 %
Lymphocytes Relative: 22 %
Lymphs Abs: 2 10*3/uL (ref 0.7–4.0)
MCH: 32.5 pg (ref 26.0–34.0)
MCHC: 32.9 g/dL (ref 30.0–36.0)
MCV: 98.9 fL (ref 80.0–100.0)
Monocytes Absolute: 0.8 10*3/uL (ref 0.1–1.0)
Monocytes Relative: 9 %
Neutro Abs: 6.2 10*3/uL (ref 1.7–7.7)
Neutrophils Relative %: 68 %
Platelet Count: 191 10*3/uL (ref 150–400)
RBC: 3.75 MIL/uL — ABNORMAL LOW (ref 3.87–5.11)
RDW: 16.2 % — ABNORMAL HIGH (ref 11.5–15.5)
WBC Count: 9.3 10*3/uL (ref 4.0–10.5)
nRBC: 0 % (ref 0.0–0.2)

## 2022-12-21 LAB — CMP (CANCER CENTER ONLY)
ALT: 18 U/L (ref 0–44)
AST: 21 U/L (ref 15–41)
Albumin: 3.3 g/dL — ABNORMAL LOW (ref 3.5–5.0)
Alkaline Phosphatase: 65 U/L (ref 38–126)
Anion gap: 6 (ref 5–15)
BUN: 15 mg/dL (ref 6–20)
CO2: 29 mmol/L (ref 22–32)
Calcium: 9.6 mg/dL (ref 8.9–10.3)
Chloride: 106 mmol/L (ref 98–111)
Creatinine: 0.71 mg/dL (ref 0.44–1.00)
GFR, Estimated: >60 mL/min (ref >60)
Glucose, Bld: 125 mg/dL — ABNORMAL HIGH (ref 70–99)
Potassium: 3.8 mmol/L (ref 3.5–5.1)
Sodium: 141 mmol/L (ref 135–145)
Total Bilirubin: 0.4 mg/dL (ref 0.3–1.2)
Total Protein: 6.4 g/dL — ABNORMAL LOW (ref 6.5–8.1)

## 2022-12-21 MED ORDER — SODIUM CHLORIDE 0.9 % IV SOLN
1000.0000 mg/m2 | Freq: Once | INTRAVENOUS | Status: AC
  Administered 2022-12-21: 1786 mg via INTRAVENOUS
  Filled 2022-12-21: qty 26.28

## 2022-12-21 MED ORDER — HEPARIN SOD (PORK) LOCK FLUSH 100 UNIT/ML IV SOLN
500.0000 [IU] | Freq: Once | INTRAVENOUS | Status: AC | PRN
  Administered 2022-12-21: 500 [IU]

## 2022-12-21 MED ORDER — PACLITAXEL PROTEIN-BOUND CHEMO INJECTION 100 MG
100.0000 mg/m2 | Freq: Once | INTRAVENOUS | Status: AC
  Administered 2022-12-21: 200 mg via INTRAVENOUS
  Filled 2022-12-21: qty 40

## 2022-12-21 MED ORDER — SODIUM CHLORIDE 0.9% FLUSH
10.0000 mL | INTRAVENOUS | Status: DC | PRN
  Administered 2022-12-21: 10 mL

## 2022-12-21 MED ORDER — SODIUM CHLORIDE 0.9 % IV SOLN: Freq: Once | INTRAVENOUS | Status: AC

## 2022-12-21 MED ORDER — PROCHLORPERAZINE MALEATE 10 MG PO TABS
10.0000 mg | ORAL_TABLET | Freq: Once | ORAL | Status: AC
  Administered 2022-12-21: 10 mg via ORAL
  Filled 2022-12-21: qty 1

## 2022-12-21 NOTE — Progress Notes (Signed)
Patient seen by Lisa Thomas NP today  Vitals are within treatment parameters.  Labs reviewed by Lisa Thomas NP and are within treatment parameters.  Per physician team, patient is ready for treatment and there are NO modifications to the treatment plan.     

## 2022-12-21 NOTE — Patient Instructions (Addendum)
[facility]   Discharge Instructions: Thank you for choosing [facility] to provide your oncology and hematology care.   If you have a lab appointment with the Cancer Center, please go directly to the Cancer Center and check in at the registration area.   Wear comfortable clothing and clothing appropriate for easy access to any Portacath or PICC line.   We strive to give you quality time with your provider. You may need to reschedule your appointment if you arrive late (15 or more minutes).  Arriving late affects you and other patients whose appointments are after yours.  Also, if you miss three or more appointments without notifying the office, you may be dismissed from the clinic at the provider's discretion.      For prescription refill requests, have your pharmacy contact our office and allow 72 hours for refills to be completed.    Today you received the following chemotherapy and/or immunotherapy agents Paclitaxel-protein bound (ABRAXANE) & Gemcitabine (GEMZAR).      To help prevent nausea and vomiting after your treatment, we encourage you to take your nausea medication as directed.  BELOW ARE SYMPTOMS THAT SHOULD BE REPORTED IMMEDIATELY: *FEVER GREATER THAN 100.4 F (38 C) OR HIGHER *CHILLS OR SWEATING *NAUSEA AND VOMITING THAT IS NOT CONTROLLED WITH YOUR NAUSEA MEDICATION *UNUSUAL SHORTNESS OF BREATH *UNUSUAL BRUISING OR BLEEDING *URINARY PROBLEMS (pain or burning when urinating, or frequent urination) *BOWEL PROBLEMS (unusual diarrhea, constipation, pain near the anus) TENDERNESS IN MOUTH AND THROAT WITH OR WITHOUT PRESENCE OF ULCERS (sore throat, sores in mouth, or a toothache) UNUSUAL RASH, SWELLING OR PAIN  UNUSUAL VAGINAL DISCHARGE OR ITCHING   Items with * indicate a potential emergency and should be followed up as soon as possible or go to the Emergency Department if any problems should occur.  Please show the  CHEMOTHERAPY ALERT CARD or IMMUNOTHERAPY ALERT CARD at check-in to the Emergency Department and triage nurse.  Should you have questions after your visit or need to cancel or reschedule your appointment, please contact [facility]  Dept: [phone]  and follow the prompts.  Office hours are 8:00 a.m. to 4:30 p.m. Monday - Friday. Please note that voicemails left after 4:00 p.m. may not be returned until the following business day.  We are closed weekends and major holidays. You have access to a nurse at all times for urgent questions. Please call the main number to the clinic Dept: [phone] and follow the prompts.   For any non-urgent questions, you may also contact your provider using MyChart. We now offer e-Visits for anyone 18 and older to request care online for non-urgent symptoms. For details visit [email or web address].   Also download the MyChart app! Go to the app store, search "MyChart", open the app, select [facility], and log in with your MyChart username and password.  Paclitaxel Nanoparticle Albumin-Bound Injection What is this medication? NANOPARTICLE ALBUMIN-BOUND PACLITAXEL (Na no PAHR ti kuhl al BYOO muhn-bound PAK li TAX el) treats some types of cancer. It works by slowing down the growth of cancer cells. This medicine may be used for other purposes; ask your health care provider or pharmacist if you have questions. COMMON BRAND NAME(S): Abraxane What should I tell my care team before I take this medication? They need to know if you have any of these conditions: Liver disease Low white blood cell levels An unusual or allergic reaction to paclitaxel, albumin, other medications, foods,   dyes, or preservatives If you or your partner are pregnant or trying to get pregnant Breast-feeding How should I use this medication? This medication is injected into a vein. It is given by your care team in a hospital or clinic setting. Talk to your  care team about the use of this medication in children. Special care may be needed. Overdosage: If you think you have taken too much of this medicine contact a poison control center or emergency room at once. NOTE: This medicine is only for you. Do not share this medicine with others. What if I miss a dose? Keep appointments for follow-up doses. It is important not to miss your dose. Call your care team if you are unable to keep an appointment. What may interact with this medication? Other medications may affect the way this medication works. Talk with your care team about all of the medications you take. They may suggest changes to your treatment plan to lower the risk of side effects and to make sure your medications work as intended. This list may not describe all possible interactions. Give your health care provider a list of all the medicines, herbs, non-prescription drugs, or dietary supplements you use. Also tell them if you smoke, drink alcohol, or use illegal drugs. Some items may interact with your medicine. What should I watch for while using this medication? Your condition will be monitored carefully while you are receiving this medication. You may need blood work while taking this medication. This medication may make you feel generally unwell. This is not uncommon as chemotherapy can affect healthy cells as well as cancer cells. Report any side effects. Continue your course of treatment even though you feel ill unless your care team tells you to stop. This medication can cause serious allergic reactions. To reduce the risk, your care team may give you other medications to take before receiving this one. Be sure to follow the directions from your care team. This medication may increase your risk of getting an infection. Call your care team for advice if you get a fever, chills, sore throat, or other symptoms of a cold or flu. Do not treat yourself. Try to avoid being around people who are  sick. This medication may increase your risk to bruise or bleed. Call your care team if you notice any unusual bleeding. Be careful brushing or flossing your teeth or using a toothpick because you may get an infection or bleed more easily. If you have any dental work done, tell your dentist you are receiving this medication. Talk to your care team if you or your partner may be pregnant. Serious birth defects can occur if you take this medication during pregnancy and for 6 months after the last dose. You will need a negative pregnancy test before starting this medication. Contraception is recommended while taking this medication and for 6 months after the last dose. Your care team can help you find the option that works for you. If your partner can get pregnant, use a condom during sex while taking this medication and for 3 months after the last dose. Do not breastfeed while taking this medication and for 2 weeks after the last dose. This medication may cause infertility. Talk to your care team if you are concerned about your fertility. What side effects may I notice from receiving this medication? Side effects that you should report to your care team as soon as possible: Allergic reactions--skin rash, itching, hives, swelling of the face, lips, tongue, or   throat Dry cough, shortness of breath or trouble breathing Infection--fever, chills, cough, sore throat, wounds that don't heal, pain or trouble when passing urine, general feeling of discomfort or being unwell Low red blood cell level--unusual weakness or fatigue, dizziness, headache, trouble breathing Pain, tingling, or numbness in the hands or feet Stomach pain, unusual weakness or fatigue, nausea, vomiting, diarrhea, or fever that lasts longer than expected Unusual bruising or bleeding Side effects that usually do not require medical attention (report to your care team if they continue or are bothersome): Diarrhea Fatigue Hair loss Loss of  appetite Nausea Vomiting This list may not describe all possible side effects. Call your doctor for medical advice about side effects. You may report side effects to FDA at 1-800-FDA-1088. Where should I keep my medication? This medication is given in a hospital or clinic. It will not be stored at home. NOTE: This sheet is a summary. It may not cover all possible information. If you have questions about this medicine, talk to your doctor, pharmacist, or health care provider.  2023 Elsevier/Gold Standard (2007-10-25 00:00:00)  Gemcitabine Injection What is this medication? GEMCITABINE (jem SYE ta been) treats some types of cancer. It works by slowing down the growth of cancer cells. This medicine may be used for other purposes; ask your health care provider or pharmacist if you have questions. COMMON BRAND NAME(S): Gemzar, Infugem What should I tell my care team before I take this medication? They need to know if you have any of these conditions: Blood disorders Infection Kidney disease Liver disease Lung or breathing disease, such as asthma or COPD Recent or ongoing radiation therapy An unusual or allergic reaction to gemcitabine, other medications, foods, dyes, or preservatives If you or your partner are pregnant or trying to get pregnant Breast-feeding How should I use this medication? This medication is injected into a vein. It is given by your care team in a hospital or clinic setting. Talk to your care team about the use of this medication in children. Special care may be needed. Overdosage: If you think you have taken too much of this medicine contact a poison control center or emergency room at once. NOTE: This medicine is only for you. Do not share this medicine with others. What if I miss a dose? Keep appointments for follow-up doses. It is important not to miss your dose. Call your care team if you are unable to keep an appointment. What may interact with this  medication? Interactions have not been studied. This list may not describe all possible interactions. Give your health care provider a list of all the medicines, herbs, non-prescription drugs, or dietary supplements you use. Also tell them if you smoke, drink alcohol, or use illegal drugs. Some items may interact with your medicine. What should I watch for while using this medication? Your condition will be monitored carefully while you are receiving this medication. This medication may make you feel generally unwell. This is not uncommon, as chemotherapy can affect healthy cells as well as cancer cells. Report any side effects. Continue your course of treatment even though you feel ill unless your care team tells you to stop. In some cases, you may be given additional medications to help with side effects. Follow all directions for their use. This medication may increase your risk of getting an infection. Call your care team for advice if you get a fever, chills, sore throat, or other symptoms of a cold or flu. Do not treat yourself. Try to   avoid being around people who are sick. This medication may increase your risk to bruise or bleed. Call your care team if you notice any unusual bleeding. Be careful brushing or flossing your teeth or using a toothpick because you may get an infection or bleed more easily. If you have any dental work done, tell your dentist you are receiving this medication. Avoid taking medications that contain aspirin, acetaminophen, ibuprofen, naproxen, or ketoprofen unless instructed by your care team. These medications may hide a fever. Talk to your care team if you or your partner wish to become pregnant or think you might be pregnant. This medication can cause serious birth defects if taken during pregnancy and for 6 months after the last dose. A negative pregnancy test is required before starting this medication. A reliable form of contraception is recommended while taking  this medication and for 6 months after the last dose. Talk to your care team about effective forms of contraception. Do not father a child while taking this medication and for 3 months after the last dose. Use a condom while having sex during this time period. Do not breastfeed while taking this medication and for at least 1 week after the last dose. This medication may cause infertility. Talk to your care team if you are concerned about your fertility. What side effects may I notice from receiving this medication? Side effects that you should report to your care team as soon as possible: Allergic reactions--skin rash, itching, hives, swelling of the face, lips, tongue, or throat Capillary leak syndrome--stomach or muscle pain, unusual weakness or fatigue, feeling faint or lightheaded, decrease in the amount of urine, swelling of the ankles, hands, or feet, trouble breathing Infection--fever, chills, cough, sore throat, wounds that don't heal, pain or trouble when passing urine, general feeling of discomfort or being unwell Liver injury--right upper belly pain, loss of appetite, nausea, light-colored stool, dark yellow or brown urine, yellowing skin or eyes, unusual weakness or fatigue Low red blood cell level--unusual weakness or fatigue, dizziness, headache, trouble breathing Lung injury--shortness of breath or trouble breathing, cough, spitting up blood, chest pain, fever Stomach pain, bloody diarrhea, pale skin, unusual weakness or fatigue, decrease in the amount of urine, which may be signs of hemolytic uremic syndrome Sudden and severe headache, confusion, change in vision, seizures, which may be signs of posterior reversible encephalopathy syndrome (PRES) Unusual bruising or bleeding Side effects that usually do not require medical attention (report to your care team if they continue or are bothersome): Diarrhea Drowsiness Hair loss Nausea Pain, redness, or swelling with sores inside the  mouth or throat Vomiting This list may not describe all possible side effects. Call your doctor for medical advice about side effects. You may report side effects to FDA at 1-800-FDA-1088. Where should I keep my medication? This medication is given in a hospital or clinic. It will not be stored at home. NOTE: This sheet is a summary. It may not cover all possible information. If you have questions about this medicine, talk to your doctor, pharmacist, or health care provider.  2023 Elsevier/Gold Standard (2022-01-09 00:00:00)  

## 2022-12-21 NOTE — Progress Notes (Signed)
Referral received to contact patient's sister to provide follow-up due to patient's poor appetite and weight loss.    Weight documented as 155 pounds 12.8 ounces on April 5.  This is decreased from 170 pounds on November 22.  Labs noted glucose 125, albumin 3.3.  I called Jasmine December who reports patient refuses to eat much food.  States she is very picky.  She has been drinking Ensure Plus but has not had much food for over 2 weeks.  Patient's biggest struggle seems to be with taste alterations.  Jasmine December could only say patient describes "food is not tasting right." Jasmine December does not believe patient is experiencing other nutrition impact symptoms.  States she has only had 1 episode of vomiting.  Patient told her sister that she does not need to take any of her medications anymore.  Jasmine December is not aware if patient is taking pancreatic enzymes or not.  She does not think patient is having significant diarrhea.  Educated on strategies to improve taste alterations.  Provided recipe for baking soda and salt water rinses.  Encouraged Jasmine December to offer patient small amounts of "snacks "at mealtimes and encourage patient to drink her Ensure Plus between meals.  We reviewed several examples.  Jasmine December declined a written copy of taste alterations handout.  I have encouraged her to reach out and contact this RD with any further questions or concerns.  She was very Adult nurse.

## 2022-12-21 NOTE — Progress Notes (Signed)
[facility] OFFICE PROGRESS NOTE   Diagnosis: Pancreas cancer  INTERVAL HISTORY:   [name] returns as scheduled.  She completed another cycle of gemcitabine/Abraxane 12/07/2022.  She denies nausea/vomiting.  No mouth sores.  She tends to have loose stools the day following chemotherapy.  No fever or rash following treatment.  No numbness or tingling in the hands or feet.  She denies pain.  Appetite overall is not good.  She continues to note an alteration in taste.  Objective:  Vital signs in last 24 hours:  Blood pressure 117/63, pulse 70, temperature 97.7 F (36.5 C), temperature source Oral, resp. rate 18, weight 155 lb 12.8 oz (70.7 kg), last menstrual period 02/07/2016, SpO2 100 %.    HEENT: No thrush or ulcers. Resp: Lungs clear bilaterally. Cardio: Regular rate and rhythm. GI: Abdomen soft and nontender.  No hepatosplenomegaly. Vascular: No leg edema. Neuro: Alert and oriented. Skin: No rash. Port-A-Cath without erythema.  Lab Results:  Lab Results  Component Value Date   WBC 9.3 12/21/2022   HGB 12.2 12/21/2022   HCT 37.1 12/21/2022   MCV 98.9 12/21/2022   PLT 191 12/21/2022   NEUTROABS 6.2 12/21/2022    Imaging:  No results found.  Medications: I have reviewed the patient's current medications.  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 CT abdomen/pelvis 07/30/2022-body/neck pancreas mass with peripheral pancreatic atrophy and duct dilation, suspected encasement of the celiac axis, uterine fibroids MRI abdomen 07/30/2022-3.7 x 4.7 cm pancreas body/tail mass with encasement of the proximal celiac artery, occlusion with collateralization at the confluence of the SMV and portal vein, no evidence of metastatic disease CT chest 08/01/2022-no intrathoracic metastases Elevated CA 19-9 EUS 08/02/2022-gastric polyp, gastric erosions, 32 x 33 mm pancreas body mass, invasion of the celiac trunk, invasion of the splenoportal confluence, no SMA invasion, 2  malignant appearing nodes in the porta hepatis,T4N1 by endoscopic criteria, FNA biopsy-adenocarcinoma 08/17/2022 [facility]-tumor mutation burden 0.96; MSI high not detected; variant of uncertain clinical significance NTRK3 T93M Cycle 1 gemcitabine/Abraxane 08/17/2022 Cycle 2 gemcitabine/Abraxane 08/31/2022 Cycle 3 gemcitabine/Abraxane 09/14/2022 Cycle 4 gemcitabine/Abraxane 09/28/2022 Cycle 5 gemcitabine/Abraxane 10/12/2022 Cycle 6 gemcitabine/Abraxane 10/26/2022 CT abdomen/pelvis 10/27/2022-stable pancreas body mass with encasement of the celiac axis and portal vein with occlusion of the splenic vein and varices.  Stable porta hepatis lymph nodes. Cycle 7 gemcitabine/Abraxane 11/09/2022 Cycle 8 gemcitabine/Abraxane 11/23/2022 Cycle 9 gemcitabine/Abraxane 12/07/2022 Cycle 10 gemcitabine/Abraxane 12/21/2022   2.  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  Anorexia/weight loss secondary to #1 4.  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  Abdominal pain 6.  History of colon polyps 7.  Family history of multiple cancers Pancreas cancer maternal aunt, maternal cousin, maternal grandfather Breast cancer-maternal aunt, maternal cousin Prostate cancer-maternal uncle Lung cancer-father 8.  Hypertension 9.  Postmenopausal vaginal bleeding 10.  H. pylori 08/02/2022-amoxicillin and Flagyl discontinued 08/21/2022 due to GI symptoms    Disposition: [name] appears stable.  She has completed 9 cycles of gemcitabine/Abraxane.  There is no clinical evidence of disease progression.  Plan to proceed with cycle 10 today as scheduled.  CBC and chemistry panel reviewed.  Labs adequate to proceed as above.  Appetite is suboptimal and she notes an alteration in taste.  We will ask the Cancer Center dietitian to discuss further with her.  She will return for lab, follow-up, Gemcitabine/Abraxane in 2 weeks.  She will contact the office in the  interim with any problems.  Lonna CobbLisa Maclane Holloran ANP/GNP-BC  12/21/2022  10:01 AM

## 2022-12-22 ENCOUNTER — Other Ambulatory Visit: Payer: Self-pay

## 2022-12-22 LAB — CANCER ANTIGEN 19-9: CA 19-9: 25 U/mL (ref 0–35)

## 2022-12-26 ENCOUNTER — Ambulatory Visit: Payer: Self-pay

## 2022-12-26 NOTE — Patient Outreach (Signed)
  Care Coordination   Follow Up Visit Note   12/27/2022 Name: Kathleen Stone MRN: [id number] DOB: Dec 23, 1965  Kathleen Stone is a 57 y.o. year old female who sees Masters, Florentina Addison, DO for primary care. I spoke with  Kathleen Stone by phone today.  What matters to the patients health and wellness today?  During my conversation with Kathleen Stone, she informed me that she is doing well overall, but her legs are still hurting, and the pain is gradually increasing. She rated the intensity of the pain at 8/10. Although she can still walk, her legs feel weak. She has been using an arthritis cream, but it has not been effective. Therefore, she is planning to ask her PCP if she can have a DEXA scan. I will forward this note to both her primary care physician and cancer physician.    Goals Addressed             This Visit's Progress    I want to eat more and and gain weight       Patient Goals/self care activities: Maintain a healthy weight Eat mall frequent meals through out the day if possible Stay hydrated Be observant of changes in bowel habits Choose foods that are easy to digest Eat protein rich food Talk with your physician about medications that can help increase your appetite. Continue with her daily regiment Use rubs to message her legs to help with pain        SDOH assessments and interventions completed:  No     Care Coordination Interventions:  Yes, provided   Interventions Today    Flowsheet Row Most Recent Value  General Interventions   General Interventions Discussed/Reviewed General Interventions Discussed  Nutrition Interventions   Nutrition Discussed/Reviewed Nutrition Discussed  Safety Interventions   Safety Discussed/Reviewed Safety Discussed        Follow up plan: Follow up call scheduled for 01/25/23  10 am    Encounter Outcome:  Pt. Visit Completed   Juanell Fairly RN, BSN, [facility] Care Coordinator [facility]   Phone: [phone]

## 2022-12-27 NOTE — Patient Instructions (Signed)
Visit Information  Thank you for taking time to visit with me today. Please don't hesitate to contact me if I can be of assistance to you.   Following are the goals we discussed today:   Goals Addressed             This Visit's Progress    I want to eat more and and gain weight       Patient Goals/self care activities: Maintain a healthy weight Eat mall frequent meals through out the day if possible Stay hydrated Be observant of changes in bowel habits Choose foods that are easy to digest Eat protein rich food Talk with your physician about medications that can help increase your appetite. Continue with her daily regiment Use rubs to message her legs to help with pain        Our next appointment is by telephone on 01/25/23 at 10 am  Please call the care guide team at [phone] if you need to cancel or reschedule your appointment.   If you are experiencing a Mental Health or Behavioral Health Crisis or need someone to talk to, please call 1-800-273-TALK (toll free, 24 hour hotline)  Patient verbalizes understanding of instructions and care plan provided today and agrees to view in MyChart. Active MyChart status and patient understanding of how to access instructions and care plan via MyChart confirmed with patient.     Juanell Fairly RN, BSN, [facility] Care Coordinator [facility]   Phone: [phone]

## 2022-12-30 ENCOUNTER — Other Ambulatory Visit: Payer: Self-pay | Admitting: Oncology

## 2023-01-04 ENCOUNTER — Inpatient Hospital Stay: Payer: Commercial Managed Care - HMO

## 2023-01-04 ENCOUNTER — Encounter: Payer: Self-pay | Admitting: Nurse Practitioner

## 2023-01-04 ENCOUNTER — Inpatient Hospital Stay (HOSPITAL_BASED_OUTPATIENT_CLINIC_OR_DEPARTMENT_OTHER): Payer: Commercial Managed Care - HMO | Admitting: Nurse Practitioner

## 2023-01-04 VITALS — BP 140/66 | HR 80 | Temp 98.1°F | Resp 18 | Ht 66.0 in | Wt 154.4 lb

## 2023-01-04 DIAGNOSIS — C251 Malignant neoplasm of body of pancreas: Secondary | ICD-10-CM | POA: Diagnosis not present

## 2023-01-04 DIAGNOSIS — Z5111 Encounter for antineoplastic chemotherapy: Secondary | ICD-10-CM | POA: Diagnosis not present

## 2023-01-04 DIAGNOSIS — Z95828 Presence of other vascular implants and grafts: Secondary | ICD-10-CM

## 2023-01-04 LAB — CBC WITH DIFFERENTIAL (CANCER CENTER ONLY)
Abs Immature Granulocytes: 0.03 10*3/uL (ref 0.00–0.07)
Basophils Absolute: 0 10*3/uL (ref 0.0–0.1)
Basophils Relative: 0 %
Eosinophils Absolute: 0.1 10*3/uL (ref 0.0–0.5)
Eosinophils Relative: 1 %
HCT: 37.3 % (ref 36.0–46.0)
Hemoglobin: 12.4 g/dL (ref 12.0–15.0)
Immature Granulocytes: 0 %
Lymphocytes Relative: 23 %
Lymphs Abs: 2.1 10*3/uL (ref 0.7–4.0)
MCH: 32.5 pg (ref 26.0–34.0)
MCHC: 33.2 g/dL (ref 30.0–36.0)
MCV: 97.6 fL (ref 80.0–100.0)
Monocytes Absolute: 0.9 10*3/uL (ref 0.1–1.0)
Monocytes Relative: 10 %
Neutro Abs: 5.8 10*3/uL (ref 1.7–7.7)
Neutrophils Relative %: 66 %
Platelet Count: 218 10*3/uL (ref 150–400)
RBC: 3.82 MIL/uL — ABNORMAL LOW (ref 3.87–5.11)
RDW: 16.8 % — ABNORMAL HIGH (ref 11.5–15.5)
WBC Count: 9 10*3/uL (ref 4.0–10.5)
nRBC: 0 % (ref 0.0–0.2)

## 2023-01-04 LAB — CMP (CANCER CENTER ONLY)
ALT: 16 U/L (ref 0–44)
AST: 21 U/L (ref 15–41)
Albumin: 3.5 g/dL (ref 3.5–5.0)
Alkaline Phosphatase: 67 U/L (ref 38–126)
Anion gap: 7 (ref 5–15)
BUN: 16 mg/dL (ref 6–20)
CO2: 29 mmol/L (ref 22–32)
Calcium: 9.7 mg/dL (ref 8.9–10.3)
Chloride: 106 mmol/L (ref 98–111)
Creatinine: 0.72 mg/dL (ref 0.44–1.00)
GFR, Estimated: >60 mL/min (ref >60)
Glucose, Bld: 90 mg/dL (ref 70–99)
Potassium: 3.8 mmol/L (ref 3.5–5.1)
Sodium: 142 mmol/L (ref 135–145)
Total Bilirubin: 0.5 mg/dL (ref 0.3–1.2)
Total Protein: 6.7 g/dL (ref 6.5–8.1)

## 2023-01-04 MED ORDER — SODIUM CHLORIDE 0.9 % IV SOLN: Freq: Once | INTRAVENOUS | Status: AC

## 2023-01-04 MED ORDER — HEPARIN SOD (PORK) LOCK FLUSH 100 UNIT/ML IV SOLN
500.0000 [IU] | Freq: Once | INTRAVENOUS | Status: AC | PRN
  Administered 2023-01-04: 500 [IU]

## 2023-01-04 MED ORDER — SODIUM CHLORIDE 0.9% FLUSH
10.0000 mL | INTRAVENOUS | Status: DC | PRN
  Administered 2023-01-04: 10 mL

## 2023-01-04 MED ORDER — PROCHLORPERAZINE MALEATE 10 MG PO TABS
10.0000 mg | ORAL_TABLET | Freq: Once | ORAL | Status: AC
  Administered 2023-01-04: 10 mg via ORAL
  Filled 2023-01-04: qty 1

## 2023-01-04 MED ORDER — PACLITAXEL PROTEIN-BOUND CHEMO INJECTION 100 MG
100.0000 mg/m2 | Freq: Once | INTRAVENOUS | Status: AC
  Administered 2023-01-04: 200 mg via INTRAVENOUS
  Filled 2023-01-04: qty 40

## 2023-01-04 MED ORDER — SODIUM CHLORIDE 0.9% FLUSH
10.0000 mL | INTRAVENOUS | Status: DC | PRN
  Administered 2023-01-04: 10 mL via INTRAVENOUS

## 2023-01-04 MED ORDER — SODIUM CHLORIDE 0.9 % IV SOLN
1000.0000 mg/m2 | Freq: Once | INTRAVENOUS | Status: AC
  Administered 2023-01-04: 1786 mg via INTRAVENOUS
  Filled 2023-01-04: qty 26.28

## 2023-01-04 NOTE — Progress Notes (Signed)
Patient seen by Lisa Thomas NP today  Vitals are within treatment parameters.  Labs reviewed by Lisa Thomas NP and are within treatment parameters.  Per physician team, patient is ready for treatment and there are NO modifications to the treatment plan.     

## 2023-01-04 NOTE — Patient Instructions (Signed)
[facility]  Discharge Instructions: Thank you for choosing [facility] to provide your oncology and hematology care.   If you have a lab appointment with the Cancer Center, please go directly to the Cancer Center and check in at the registration area.   Wear comfortable clothing and clothing appropriate for easy access to any Portacath or PICC line.   We strive to give you quality time with your provider. You may need to reschedule your appointment if you arrive late (15 or more minutes).  Arriving late affects you and other patients whose appointments are after yours.  Also, if you miss three or more appointments without notifying the office, you may be dismissed from the clinic at the provider's discretion.      For prescription refill requests, have your pharmacy contact our office and allow 72 hours for refills to be completed.    Today you received the following chemotherapy and/or immunotherapy agents Abraxane and Gemzar      To help prevent nausea and vomiting after your treatment, we encourage you to take your nausea medication as directed.  BELOW ARE SYMPTOMS THAT SHOULD BE REPORTED IMMEDIATELY: *FEVER GREATER THAN 100.4 F (38 C) OR HIGHER *CHILLS OR SWEATING *NAUSEA AND VOMITING THAT IS NOT CONTROLLED WITH YOUR NAUSEA MEDICATION *UNUSUAL SHORTNESS OF BREATH *UNUSUAL BRUISING OR BLEEDING *URINARY PROBLEMS (pain or burning when urinating, or frequent urination) *BOWEL PROBLEMS (unusual diarrhea, constipation, pain near the anus) TENDERNESS IN MOUTH AND THROAT WITH OR WITHOUT PRESENCE OF ULCERS (sore throat, sores in mouth, or a toothache) UNUSUAL RASH, SWELLING OR PAIN  UNUSUAL VAGINAL DISCHARGE OR ITCHING   Items with * indicate a potential emergency and should be followed up as soon as possible or go to the Emergency Department if any problems should occur.  Please show the CHEMOTHERAPY ALERT CARD or IMMUNOTHERAPY ALERT CARD  at check-in to the Emergency Department and triage nurse.  Should you have questions after your visit or need to cancel or reschedule your appointment, please contact [facility]  Dept: [phone]  and follow the prompts.  Office hours are 8:00 a.m. to 4:30 p.m. Monday - Friday. Please note that voicemails left after 4:00 p.m. may not be returned until the following business day.  We are closed weekends and major holidays. You have access to a nurse at all times for urgent questions. Please call the main number to the clinic Dept: [phone] and follow the prompts.   For any non-urgent questions, you may also contact your provider using MyChart. We now offer e-Visits for anyone 7 and older to request care online for non-urgent symptoms. For details visit [email or web address].   Also download the MyChart app! Go to the app store, search "MyChart", open the app, select , and log in with your MyChart username and password.

## 2023-01-04 NOTE — Progress Notes (Signed)
  [facility] OFFICE PROGRESS NOTE   Diagnosis: Pancreas cancer  INTERVAL HISTORY:   Kathleen Stone returns as scheduled.  She completed another cycle of gemcitabine/Abraxane 12/21/2022.  No nausea.  No mouth sores.  She has occasional loose stools.  No fever or rash after treatment.  No numbness or tingling in the hands or feet.  She denies pain.  Oral intake is affected by alteration in taste.  Objective:  Vital signs in last 24 hours:  Blood pressure (!) 140/66, pulse 80, temperature 98.1 F (36.7 C), temperature source Oral, resp. rate 18, height  (1.676 m), weight 154 lb 6.4 oz (70 kg), last menstrual period 02/07/2016, SpO2 100 %.    HEENT: No thrush or ulcers. Resp: Lungs clear bilaterally. Cardio: Regular rate and rhythm. GI: Abdomen soft and nontender.  No hepatosplenomegaly. Vascular: No leg edema. Skin: No rash. Port-A-Cath without erythema.   Lab Results:  Lab Results  Component Value Date   WBC 9.0 01/04/2023   HGB 12.4 01/04/2023   HCT 37.3 01/04/2023   MCV 97.6 01/04/2023   PLT 218 01/04/2023   NEUTROABS 5.8 01/04/2023    Imaging:  No results found.  Medications: I have reviewed the patient's current medications.  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 CT abdomen/pelvis 07/30/2022-body/neck pancreas mass with peripheral pancreatic atrophy and duct dilation, suspected encasement of the celiac axis, uterine fibroids MRI abdomen 07/30/2022-3.7 x 4.7 cm pancreas body/tail mass with encasement of the proximal celiac artery, occlusion with collateralization at the confluence of the SMV and portal vein, no evidence of metastatic disease CT chest 08/01/2022-no intrathoracic metastases Elevated CA 19-9 EUS 08/02/2022-gastric polyp, gastric erosions, 32 x 33 mm pancreas body mass, invasion of the celiac trunk, invasion of the splenoportal confluence, no SMA invasion, 2 malignant appearing nodes in the porta hepatis,T4N1 by endoscopic criteria, FNA  biopsy-adenocarcinoma 08/17/2022 [employer]-tumor mutation burden 0.96; MSI high not detected; variant of uncertain clinical significance NTRK3 T93M Cycle 1 gemcitabine/Abraxane 08/17/2022 Cycle 2 gemcitabine/Abraxane 08/31/2022 Cycle 3 gemcitabine/Abraxane 09/14/2022 Cycle 4 gemcitabine/Abraxane 09/28/2022 Cycle 5 gemcitabine/Abraxane 10/12/2022 Cycle 6 gemcitabine/Abraxane 10/26/2022 CT abdomen/pelvis 10/27/2022-stable pancreas body mass with encasement of the celiac axis and portal vein with occlusion of the splenic vein and varices.  Stable porta hepatis lymph nodes. Cycle 7 gemcitabine/Abraxane 11/09/2022 Cycle 8 gemcitabine/Abraxane 11/23/2022 Cycle 9 gemcitabine/Abraxane 12/07/2022 Cycle 10 gemcitabine/Abraxane 12/21/2022 Cycle 11 gemcitabine/Abraxane 01/04/2023   2.  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  Anorexia/weight loss secondary to #1 4.  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  Abdominal pain 6.  History of colon polyps 7.  Family history of multiple cancers Pancreas cancer maternal aunt, maternal cousin, maternal grandfather Breast cancer-maternal aunt, maternal cousin Prostate cancer-maternal uncle Lung cancer-father 8.  Hypertension 9.  Postmenopausal vaginal bleeding 10.  H. pylori 08/02/2022-amoxicillin and Flagyl discontinued 08/21/2022 due to GI symptoms    Disposition: Kathleen Stone appears stable.  She has completed 10 cycles of gemcitabine/Abraxane.  There is no clinical evidence of disease progression.  Most recent CA 19-9 was further improved, now in normal range.  Plan to proceed with cycle 11 gemcitabine/Abraxane today as scheduled.  CBC reviewed.  Counts adequate to proceed as above.  She will return for lab, follow-up, Gemcitabine/Abraxane in 2 weeks.    Kathleen Stone ANP/GNP-BC   01/04/2023  9:17 AM

## 2023-01-05 ENCOUNTER — Other Ambulatory Visit: Payer: Self-pay

## 2023-01-06 LAB — CANCER ANTIGEN 19-9: CA 19-9: 22 U/mL (ref 0–35)

## 2023-01-09 ENCOUNTER — Other Ambulatory Visit: Payer: Self-pay

## 2023-01-13 ENCOUNTER — Other Ambulatory Visit: Payer: Self-pay | Admitting: Oncology

## 2023-01-18 ENCOUNTER — Encounter: Payer: Self-pay | Admitting: *Deleted

## 2023-01-18 ENCOUNTER — Inpatient Hospital Stay: Payer: Commercial Managed Care - HMO | Attending: Oncology

## 2023-01-18 ENCOUNTER — Inpatient Hospital Stay: Payer: Commercial Managed Care - HMO

## 2023-01-18 ENCOUNTER — Inpatient Hospital Stay (HOSPITAL_BASED_OUTPATIENT_CLINIC_OR_DEPARTMENT_OTHER): Payer: Commercial Managed Care - HMO | Admitting: Oncology

## 2023-01-18 VITALS — BP 136/69 | HR 78 | Temp 98.2°F | Resp 18 | Ht 66.0 in | Wt 152.0 lb

## 2023-01-18 VITALS — BP 132/64 | HR 60 | Resp 18

## 2023-01-18 DIAGNOSIS — Z801 Family history of malignant neoplasm of trachea, bronchus and lung: Secondary | ICD-10-CM | POA: Insufficient documentation

## 2023-01-18 DIAGNOSIS — C251 Malignant neoplasm of body of pancreas: Secondary | ICD-10-CM

## 2023-01-18 DIAGNOSIS — I10 Essential (primary) hypertension: Secondary | ICD-10-CM | POA: Insufficient documentation

## 2023-01-18 DIAGNOSIS — Z800 Family history of malignant neoplasm of digestive organs: Secondary | ICD-10-CM | POA: Insufficient documentation

## 2023-01-18 DIAGNOSIS — Z5111 Encounter for antineoplastic chemotherapy: Secondary | ICD-10-CM | POA: Insufficient documentation

## 2023-01-18 DIAGNOSIS — Z803 Family history of malignant neoplasm of breast: Secondary | ICD-10-CM | POA: Diagnosis not present

## 2023-01-18 DIAGNOSIS — Z8042 Family history of malignant neoplasm of prostate: Secondary | ICD-10-CM | POA: Diagnosis not present

## 2023-01-18 LAB — CBC WITH DIFFERENTIAL (CANCER CENTER ONLY)
Abs Immature Granulocytes: 0.03 10*3/uL (ref 0.00–0.07)
Basophils Absolute: 0 10*3/uL (ref 0.0–0.1)
Basophils Relative: 0 %
Eosinophils Absolute: 0.1 10*3/uL (ref 0.0–0.5)
Eosinophils Relative: 2 %
HCT: 38.4 % (ref 36.0–46.0)
Hemoglobin: 12.6 g/dL (ref 12.0–15.0)
Immature Granulocytes: 0 %
Lymphocytes Relative: 25 %
Lymphs Abs: 2.2 10*3/uL (ref 0.7–4.0)
MCH: 32.1 pg (ref 26.0–34.0)
MCHC: 32.8 g/dL (ref 30.0–36.0)
MCV: 97.7 fL (ref 80.0–100.0)
Monocytes Absolute: 0.9 10*3/uL (ref 0.1–1.0)
Monocytes Relative: 10 %
Neutro Abs: 5.6 10*3/uL (ref 1.7–7.7)
Neutrophils Relative %: 63 %
Platelet Count: 206 10*3/uL (ref 150–400)
RBC: 3.93 MIL/uL (ref 3.87–5.11)
RDW: 17.2 % — ABNORMAL HIGH (ref 11.5–15.5)
WBC Count: 8.9 10*3/uL (ref 4.0–10.5)
nRBC: 0 % (ref 0.0–0.2)

## 2023-01-18 LAB — CMP (CANCER CENTER ONLY)
ALT: 17 U/L (ref 0–44)
AST: 21 U/L (ref 15–41)
Albumin: 3.5 g/dL (ref 3.5–5.0)
Alkaline Phosphatase: 65 U/L (ref 38–126)
Anion gap: 6 (ref 5–15)
BUN: 16 mg/dL (ref 6–20)
CO2: 29 mmol/L (ref 22–32)
Calcium: 9.4 mg/dL (ref 8.9–10.3)
Chloride: 107 mmol/L (ref 98–111)
Creatinine: 0.76 mg/dL (ref 0.44–1.00)
GFR, Estimated: >60 mL/min (ref >60)
Glucose, Bld: 123 mg/dL — ABNORMAL HIGH (ref 70–99)
Potassium: 3.9 mmol/L (ref 3.5–5.1)
Sodium: 142 mmol/L (ref 135–145)
Total Bilirubin: 0.5 mg/dL (ref 0.3–1.2)
Total Protein: 6.5 g/dL (ref 6.5–8.1)

## 2023-01-18 MED ORDER — PACLITAXEL PROTEIN-BOUND CHEMO INJECTION 100 MG
100.0000 mg/m2 | Freq: Once | INTRAVENOUS | Status: AC
  Administered 2023-01-18: 200 mg via INTRAVENOUS
  Filled 2023-01-18: qty 40

## 2023-01-18 MED ORDER — PROCHLORPERAZINE MALEATE 10 MG PO TABS
10.0000 mg | ORAL_TABLET | Freq: Once | ORAL | Status: AC
  Administered 2023-01-18: 10 mg via ORAL
  Filled 2023-01-18: qty 1

## 2023-01-18 MED ORDER — SODIUM CHLORIDE 0.9 % IV SOLN
1000.0000 mg/m2 | Freq: Once | INTRAVENOUS | Status: AC
  Administered 2023-01-18: 1786 mg via INTRAVENOUS
  Filled 2023-01-18: qty 26.28

## 2023-01-18 MED ORDER — SODIUM CHLORIDE 0.9 % IV SOLN: Freq: Once | INTRAVENOUS | Status: AC

## 2023-01-18 MED ORDER — HEPARIN SOD (PORK) LOCK FLUSH 100 UNIT/ML IV SOLN
500.0000 [IU] | Freq: Once | INTRAVENOUS | Status: AC | PRN
  Administered 2023-01-18: 500 [IU]

## 2023-01-18 MED ORDER — SODIUM CHLORIDE 0.9% FLUSH
10.0000 mL | INTRAVENOUS | Status: DC | PRN
  Administered 2023-01-18: 10 mL

## 2023-01-18 NOTE — Progress Notes (Signed)
[facility] OFFICE PROGRESS NOTE   Diagnosis: Pancreas cancer  INTERVAL HISTORY:   Kathleen Stone completed on cycle gemcitabine/Abraxane on 01/04/2023.  No abdominal pain.  No nausea.  No fever or neuropathy symptoms.  She has a dry rash over the back.  She developed bilateral leg pain" weakness "the day following the last cycle of chemotherapy.  This resolved over 1-2 days.  She has 3-4 episodes of diarrhea on the day following chemotherapy.  Objective:  Vital signs in last 24 hours:  Blood pressure 136/69, pulse 78, temperature 98.2 F (36.8 C), temperature source Oral, resp. rate 18, height 5\' 6"  (1.676 m), weight 152 lb (68.9 kg), last menstrual period 02/07/2016, SpO2 100 %.    HEENT: No thrush or ulcers Resp: Lungs clear bilaterally Cardio: Rate and rhythm GI: No hepatosplenomegaly, no mass, nontender Vascular: No leg edema or erythema Neuro: The leg and foot strength are intact bilaterally Skin: Dry hyperpigmented rash at the upper back  Portacath/PICC-without erythema  Lab Results:  Lab Results  Component Value Date   WBC 8.9 01/18/2023   HGB 12.6 01/18/2023   HCT 38.4 01/18/2023   MCV 97.7 01/18/2023   PLT 206 01/18/2023   NEUTROABS 5.6 01/18/2023    CMP  Lab Results  Component Value Date   NA 142 01/04/2023   K 3.8 01/04/2023   CL 106 01/04/2023   CO2 29 01/04/2023   GLUCOSE 90 01/04/2023   BUN 16 01/04/2023   CREATININE 0.72 01/04/2023   CALCIUM 9.7 01/04/2023   PROT 6.7 01/04/2023   ALBUMIN 3.5 01/04/2023   AST 21 01/04/2023   ALT 16 01/04/2023   ALKPHOS 67 01/04/2023   BILITOT 0.5 01/04/2023   GFRNONAA >60 01/04/2023   GFRAA >60 02/29/2020    Lab Results  Component Value Date   CEA1 27.4 (H) 07/31/2022   ZOX096 22 01/04/2023    Medications: I have reviewed the patient's current medications.   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 CT abdomen/pelvis 07/30/2022-body/neck pancreas mass with peripheral pancreatic atrophy and  duct dilation, suspected encasement of the celiac axis, uterine fibroids MRI abdomen 07/30/2022-3.7 x 4.7 cm pancreas body/tail mass with encasement of the proximal celiac artery, occlusion with collateralization at the confluence of the SMV and portal vein, no evidence of metastatic disease CT chest 08/01/2022-no intrathoracic metastases Elevated CA 19-9 EUS 08/02/2022-gastric polyp, gastric erosions, 32 x 33 mm pancreas body mass, invasion of the celiac trunk, invasion of the splenoportal confluence, no SMA invasion, 2 malignant appearing nodes in the porta hepatis,T4N1 by endoscopic criteria, FNA biopsy-adenocarcinoma 08/17/2022 [employer]-tumor mutation burden 0.96; MSI high not detected; variant of uncertain clinical significance NTRK3 T93M Cycle 1 gemcitabine/Abraxane 08/17/2022 Cycle 2 gemcitabine/Abraxane 08/31/2022 Cycle 3 gemcitabine/Abraxane 09/14/2022 Cycle 4 gemcitabine/Abraxane 09/28/2022 Cycle 5 gemcitabine/Abraxane 10/12/2022 Cycle 6 gemcitabine/Abraxane 10/26/2022 CT abdomen/pelvis 10/27/2022-stable pancreas body mass with encasement of the celiac axis and portal vein with occlusion of the splenic vein and varices.  Stable porta hepatis lymph nodes. Cycle 7 gemcitabine/Abraxane 11/09/2022 Cycle 8 gemcitabine/Abraxane 11/23/2022 Cycle 9 gemcitabine/Abraxane 12/07/2022 Cycle 10 gemcitabine/Abraxane 12/21/2022 Cycle 11 gemcitabine/Abraxane 01/04/2023 Cycle 12 gemcitabine/Abraxane 01/18/2023   2.  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  Anorexia/weight loss secondary to #1 4.  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  Abdominal pain 6.  History of colon polyps 7.  Family history of multiple cancers Pancreas cancer maternal aunt, maternal cousin, maternal grandfather Breast cancer-maternal aunt, maternal cousin Prostate cancer-maternal uncle Lung cancer-father 8.  Hypertension 9.  Postmenopausal vaginal  bleeding 10.  H. pylori 08/02/2022-amoxicillin and Flagyl discontinued 08/21/2022 due to GI symptoms      Disposition: Kathleen Stone appears stable.  She continues to tolerate the gemcitabine/Abraxane well.  The etiology of the leg pain and weakness following the last cycle of chemotherapy is unclear.  She will call if this occurs again.  She will complete another cycle of gemcitabine/Abraxane today.  The plan is to schedule a restaging CT after the cycle of chemotherapy 02/15/2023.  Kathleen Stone will return for an office visit and chemotherapy in 2 weeks.  Thornton Papas, MD  01/18/2023  9:57 AM

## 2023-01-18 NOTE — Progress Notes (Signed)
Patient seen by Dr. Truett Perna today  Vitals are within treatment parameters.  Labs reviewed by Dr. Truett Perna and are within treatment parameters.  Per physician team, patient is ready for treatment and there are NO modifications to the treatment plan.   **Patient given 1 case Ensure today**

## 2023-01-18 NOTE — Patient Instructions (Addendum)
[facility]   Discharge Instructions: Thank you for choosing [facility] to provide your oncology and hematology care.   If you have a lab appointment with the Cancer Center, please go directly to the Cancer Center and check in at the registration area.   Wear comfortable clothing and clothing appropriate for easy access to any Portacath or PICC line.   We strive to give you quality time with your provider. You may need to reschedule your appointment if you arrive late (15 or more minutes).  Arriving late affects you and other patients whose appointments are after yours.  Also, if you miss three or more appointments without notifying the office, you may be dismissed from the clinic at the provider's discretion.      For prescription refill requests, have your pharmacy contact our office and allow 72 hours for refills to be completed.    Today you received the following chemotherapy and/or immunotherapy agents Paclitaxel-protein bound (ABRAXANE) & Gemcitabine (GEMZAR).      To help prevent nausea and vomiting after your treatment, we encourage you to take your nausea medication as directed.  BELOW ARE SYMPTOMS THAT SHOULD BE REPORTED IMMEDIATELY: *FEVER GREATER THAN 100.4 F (38 C) OR HIGHER *CHILLS OR SWEATING *NAUSEA AND VOMITING THAT IS NOT CONTROLLED WITH YOUR NAUSEA MEDICATION *UNUSUAL SHORTNESS OF BREATH *UNUSUAL BRUISING OR BLEEDING *URINARY PROBLEMS (pain or burning when urinating, or frequent urination) *BOWEL PROBLEMS (unusual diarrhea, constipation, pain near the anus) TENDERNESS IN MOUTH AND THROAT WITH OR WITHOUT PRESENCE OF ULCERS (sore throat, sores in mouth, or a toothache) UNUSUAL RASH, SWELLING OR PAIN  UNUSUAL VAGINAL DISCHARGE OR ITCHING   Items with * indicate a potential emergency and should be followed up as soon as possible or go to the Emergency Department if any problems should occur.  Please show the  CHEMOTHERAPY ALERT CARD or IMMUNOTHERAPY ALERT CARD at check-in to the Emergency Department and triage nurse.  Should you have questions after your visit or need to cancel or reschedule your appointment, please contact [facility]  Dept: [phone]  and follow the prompts.  Office hours are 8:00 a.m. to 4:30 p.m. Monday - Friday. Please note that voicemails left after 4:00 p.m. may not be returned until the following business day.  We are closed weekends and major holidays. You have access to a nurse at all times for urgent questions. Please call the main number to the clinic Dept: [phone] and follow the prompts.   For any non-urgent questions, you may also contact your provider using MyChart. We now offer e-Visits for anyone 18 and older to request care online for non-urgent symptoms. For details visit [email or web address].   Also download the MyChart app! Go to the app store, search "MyChart", open the app, select [facility], and log in with your MyChart username and password.  Paclitaxel Nanoparticle Albumin-Bound Injection What is this medication? NANOPARTICLE ALBUMIN-BOUND PACLITAXEL (Na no PAHR ti kuhl al BYOO muhn-bound PAK li TAX el) treats some types of cancer. It works by slowing down the growth of cancer cells. This medicine may be used for other purposes; ask your health care provider or pharmacist if you have questions. COMMON BRAND NAME(S): Abraxane What should I tell my care team before I take this medication? They need to know if you have any of these conditions: Liver disease Low white blood cell levels An unusual or allergic reaction to paclitaxel, albumin, other medications, foods,   dyes, or preservatives If you or your partner are pregnant or trying to get pregnant Breast-feeding How should I use this medication? This medication is injected into a vein. It is given by your care team in a hospital or clinic setting. Talk to your  care team about the use of this medication in children. Special care may be needed. Overdosage: If you think you have taken too much of this medicine contact a poison control center or emergency room at once. NOTE: This medicine is only for you. Do not share this medicine with others. What if I miss a dose? Keep appointments for follow-up doses. It is important not to miss your dose. Call your care team if you are unable to keep an appointment. What may interact with this medication? Other medications may affect the way this medication works. Talk with your care team about all of the medications you take. They may suggest changes to your treatment plan to lower the risk of side effects and to make sure your medications work as intended. This list may not describe all possible interactions. Give your health care provider a list of all the medicines, herbs, non-prescription drugs, or dietary supplements you use. Also tell them if you smoke, drink alcohol, or use illegal drugs. Some items may interact with your medicine. What should I watch for while using this medication? Your condition will be monitored carefully while you are receiving this medication. You may need blood work while taking this medication. This medication may make you feel generally unwell. This is not uncommon as chemotherapy can affect healthy cells as well as cancer cells. Report any side effects. Continue your course of treatment even though you feel ill unless your care team tells you to stop. This medication can cause serious allergic reactions. To reduce the risk, your care team may give you other medications to take before receiving this one. Be sure to follow the directions from your care team. This medication may increase your risk of getting an infection. Call your care team for advice if you get a fever, chills, sore throat, or other symptoms of a cold or flu. Do not treat yourself. Try to avoid being around people who are  sick. This medication may increase your risk to bruise or bleed. Call your care team if you notice any unusual bleeding. Be careful brushing or flossing your teeth or using a toothpick because you may get an infection or bleed more easily. If you have any dental work done, tell your dentist you are receiving this medication. Talk to your care team if you or your partner may be pregnant. Serious birth defects can occur if you take this medication during pregnancy and for 6 months after the last dose. You will need a negative pregnancy test before starting this medication. Contraception is recommended while taking this medication and for 6 months after the last dose. Your care team can help you find the option that works for you. If your partner can get pregnant, use a condom during sex while taking this medication and for 3 months after the last dose. Do not breastfeed while taking this medication and for 2 weeks after the last dose. This medication may cause infertility. Talk to your care team if you are concerned about your fertility. What side effects may I notice from receiving this medication? Side effects that you should report to your care team as soon as possible: Allergic reactions--skin rash, itching, hives, swelling of the face, lips, tongue, or   throat Dry cough, shortness of breath or trouble breathing Infection--fever, chills, cough, sore throat, wounds that don't heal, pain or trouble when passing urine, general feeling of discomfort or being unwell Low red blood cell level--unusual weakness or fatigue, dizziness, headache, trouble breathing Pain, tingling, or numbness in the hands or feet Stomach pain, unusual weakness or fatigue, nausea, vomiting, diarrhea, or fever that lasts longer than expected Unusual bruising or bleeding Side effects that usually do not require medical attention (report to your care team if they continue or are bothersome): Diarrhea Fatigue Hair loss Loss of  appetite Nausea Vomiting This list may not describe all possible side effects. Call your doctor for medical advice about side effects. You may report side effects to FDA at 1-800-FDA-1088. Where should I keep my medication? This medication is given in a hospital or clinic. It will not be stored at home. NOTE: This sheet is a summary. It may not cover all possible information. If you have questions about this medicine, talk to your doctor, pharmacist, or health care provider.  2023 Elsevier/Gold Standard (2007-10-25 00:00:00)  Gemcitabine Injection What is this medication? GEMCITABINE (jem SYE ta been) treats some types of cancer. It works by slowing down the growth of cancer cells. This medicine may be used for other purposes; ask your health care provider or pharmacist if you have questions. COMMON BRAND NAME(S): Gemzar, Infugem What should I tell my care team before I take this medication? They need to know if you have any of these conditions: Blood disorders Infection Kidney disease Liver disease Lung or breathing disease, such as asthma or COPD Recent or ongoing radiation therapy An unusual or allergic reaction to gemcitabine, other medications, foods, dyes, or preservatives If you or your partner are pregnant or trying to get pregnant Breast-feeding How should I use this medication? This medication is injected into a vein. It is given by your care team in a hospital or clinic setting. Talk to your care team about the use of this medication in children. Special care may be needed. Overdosage: If you think you have taken too much of this medicine contact a poison control center or emergency room at once. NOTE: This medicine is only for you. Do not share this medicine with others. What if I miss a dose? Keep appointments for follow-up doses. It is important not to miss your dose. Call your care team if you are unable to keep an appointment. What may interact with this  medication? Interactions have not been studied. This list may not describe all possible interactions. Give your health care provider a list of all the medicines, herbs, non-prescription drugs, or dietary supplements you use. Also tell them if you smoke, drink alcohol, or use illegal drugs. Some items may interact with your medicine. What should I watch for while using this medication? Your condition will be monitored carefully while you are receiving this medication. This medication may make you feel generally unwell. This is not uncommon, as chemotherapy can affect healthy cells as well as cancer cells. Report any side effects. Continue your course of treatment even though you feel ill unless your care team tells you to stop. In some cases, you may be given additional medications to help with side effects. Follow all directions for their use. This medication may increase your risk of getting an infection. Call your care team for advice if you get a fever, chills, sore throat, or other symptoms of a cold or flu. Do not treat yourself. Try to   avoid being around people who are sick. This medication may increase your risk to bruise or bleed. Call your care team if you notice any unusual bleeding. Be careful brushing or flossing your teeth or using a toothpick because you may get an infection or bleed more easily. If you have any dental work done, tell your dentist you are receiving this medication. Avoid taking medications that contain aspirin, acetaminophen, ibuprofen, naproxen, or ketoprofen unless instructed by your care team. These medications may hide a fever. Talk to your care team if you or your partner wish to become pregnant or think you might be pregnant. This medication can cause serious birth defects if taken during pregnancy and for 6 months after the last dose. A negative pregnancy test is required before starting this medication. A reliable form of contraception is recommended while taking  this medication and for 6 months after the last dose. Talk to your care team about effective forms of contraception. Do not father a child while taking this medication and for 3 months after the last dose. Use a condom while having sex during this time period. Do not breastfeed while taking this medication and for at least 1 week after the last dose. This medication may cause infertility. Talk to your care team if you are concerned about your fertility. What side effects may I notice from receiving this medication? Side effects that you should report to your care team as soon as possible: Allergic reactions--skin rash, itching, hives, swelling of the face, lips, tongue, or throat Capillary leak syndrome--stomach or muscle pain, unusual weakness or fatigue, feeling faint or lightheaded, decrease in the amount of urine, swelling of the ankles, hands, or feet, trouble breathing Infection--fever, chills, cough, sore throat, wounds that don't heal, pain or trouble when passing urine, general feeling of discomfort or being unwell Liver injury--right upper belly pain, loss of appetite, nausea, light-colored stool, dark yellow or brown urine, yellowing skin or eyes, unusual weakness or fatigue Low red blood cell level--unusual weakness or fatigue, dizziness, headache, trouble breathing Lung injury--shortness of breath or trouble breathing, cough, spitting up blood, chest pain, fever Stomach pain, bloody diarrhea, pale skin, unusual weakness or fatigue, decrease in the amount of urine, which may be signs of hemolytic uremic syndrome Sudden and severe headache, confusion, change in vision, seizures, which may be signs of posterior reversible encephalopathy syndrome (PRES) Unusual bruising or bleeding Side effects that usually do not require medical attention (report to your care team if they continue or are bothersome): Diarrhea Drowsiness Hair loss Nausea Pain, redness, or swelling with sores inside the  mouth or throat Vomiting This list may not describe all possible side effects. Call your doctor for medical advice about side effects. You may report side effects to FDA at 1-800-FDA-1088. Where should I keep my medication? This medication is given in a hospital or clinic. It will not be stored at home. NOTE: This sheet is a summary. It may not cover all possible information. If you have questions about this medicine, talk to your doctor, pharmacist, or health care provider.  2023 Elsevier/Gold Standard (2022-01-09 00:00:00)  

## 2023-01-19 ENCOUNTER — Other Ambulatory Visit: Payer: Self-pay

## 2023-01-19 LAB — CANCER ANTIGEN 19-9: CA 19-9: 18 U/mL (ref 0–35)

## 2023-01-21 ENCOUNTER — Other Ambulatory Visit: Payer: Self-pay

## 2023-01-25 ENCOUNTER — Telehealth: Payer: Self-pay

## 2023-01-25 NOTE — Patient Outreach (Signed)
  Care Coordination   01/25/2023 Name: Kathleen Stone MRN: [id number] DOB: February 26, 1966   Care Coordination Outreach Attempts:  An unsuccessful telephone outreach was attempted today to offer the patient information about available care coordination services.  Follow Up Plan:  Additional outreach attempts will be made to offer the patient care coordination information and services.   Encounter Outcome:  No Answer   Care Coordination Interventions:  No, not indicated    Juanell Fairly RN, BSN, [facility] Care Coordinator [facility]   Phone: [phone]

## 2023-02-01 ENCOUNTER — Inpatient Hospital Stay (HOSPITAL_BASED_OUTPATIENT_CLINIC_OR_DEPARTMENT_OTHER): Payer: Commercial Managed Care - HMO | Admitting: Nurse Practitioner

## 2023-02-01 ENCOUNTER — Inpatient Hospital Stay: Payer: Commercial Managed Care - HMO

## 2023-02-01 ENCOUNTER — Other Ambulatory Visit: Payer: Commercial Managed Care - HMO

## 2023-02-01 ENCOUNTER — Encounter: Payer: Self-pay | Admitting: Nurse Practitioner

## 2023-02-01 VITALS — BP 118/52 | HR 66 | Resp 18

## 2023-02-01 DIAGNOSIS — C251 Malignant neoplasm of body of pancreas: Secondary | ICD-10-CM | POA: Diagnosis not present

## 2023-02-01 DIAGNOSIS — Z5111 Encounter for antineoplastic chemotherapy: Secondary | ICD-10-CM | POA: Diagnosis not present

## 2023-02-01 LAB — CMP (CANCER CENTER ONLY)
ALT: 16 U/L (ref 0–44)
AST: 20 U/L (ref 15–41)
Albumin: 3.5 g/dL (ref 3.5–5.0)
Alkaline Phosphatase: 64 U/L (ref 38–126)
Anion gap: 6 (ref 5–15)
BUN: 16 mg/dL (ref 6–20)
CO2: 30 mmol/L (ref 22–32)
Calcium: 9.4 mg/dL (ref 8.9–10.3)
Chloride: 106 mmol/L (ref 98–111)
Creatinine: 0.83 mg/dL (ref 0.44–1.00)
GFR, Estimated: >60 mL/min (ref >60)
Glucose, Bld: 92 mg/dL (ref 70–99)
Potassium: 3.9 mmol/L (ref 3.5–5.1)
Sodium: 142 mmol/L (ref 135–145)
Total Bilirubin: 0.5 mg/dL (ref 0.3–1.2)
Total Protein: 6.5 g/dL (ref 6.5–8.1)

## 2023-02-01 LAB — CBC WITH DIFFERENTIAL (CANCER CENTER ONLY)
Abs Immature Granulocytes: 0.03 10*3/uL (ref 0.00–0.07)
Basophils Absolute: 0 10*3/uL (ref 0.0–0.1)
Basophils Relative: 0 %
Eosinophils Absolute: 0.1 10*3/uL (ref 0.0–0.5)
Eosinophils Relative: 2 %
HCT: 37.2 % (ref 36.0–46.0)
Hemoglobin: 12.5 g/dL (ref 12.0–15.0)
Immature Granulocytes: 0 %
Lymphocytes Relative: 26 %
Lymphs Abs: 2.2 10*3/uL (ref 0.7–4.0)
MCH: 32.6 pg (ref 26.0–34.0)
MCHC: 33.6 g/dL (ref 30.0–36.0)
MCV: 96.9 fL (ref 80.0–100.0)
Monocytes Absolute: 0.8 10*3/uL (ref 0.1–1.0)
Monocytes Relative: 9 %
Neutro Abs: 5.5 10*3/uL (ref 1.7–7.7)
Neutrophils Relative %: 63 %
Platelet Count: 198 10*3/uL (ref 150–400)
RBC: 3.84 MIL/uL — ABNORMAL LOW (ref 3.87–5.11)
RDW: 17.9 % — ABNORMAL HIGH (ref 11.5–15.5)
WBC Count: 8.7 10*3/uL (ref 4.0–10.5)
nRBC: 0 % (ref 0.0–0.2)

## 2023-02-01 MED ORDER — SODIUM CHLORIDE 0.9% FLUSH
10.0000 mL | INTRAVENOUS | Status: DC | PRN
  Administered 2023-02-01: 10 mL

## 2023-02-01 MED ORDER — SODIUM CHLORIDE 0.9 % IV SOLN
1000.0000 mg/m2 | Freq: Once | INTRAVENOUS | Status: AC
  Administered 2023-02-01: 1786 mg via INTRAVENOUS
  Filled 2023-02-01: qty 26.28

## 2023-02-01 MED ORDER — SODIUM CHLORIDE 0.9 % IV SOLN: Freq: Once | INTRAVENOUS | Status: AC

## 2023-02-01 MED ORDER — PROCHLORPERAZINE MALEATE 10 MG PO TABS
10.0000 mg | ORAL_TABLET | Freq: Once | ORAL | Status: AC
  Administered 2023-02-01: 10 mg via ORAL
  Filled 2023-02-01: qty 1

## 2023-02-01 MED ORDER — HEPARIN SOD (PORK) LOCK FLUSH 100 UNIT/ML IV SOLN
500.0000 [IU] | Freq: Once | INTRAVENOUS | Status: AC | PRN
  Administered 2023-02-01: 500 [IU]

## 2023-02-01 MED ORDER — PACLITAXEL PROTEIN-BOUND CHEMO INJECTION 100 MG
100.0000 mg/m2 | Freq: Once | INTRAVENOUS | Status: AC
  Administered 2023-02-01: 200 mg via INTRAVENOUS
  Filled 2023-02-01: qty 40

## 2023-02-01 NOTE — Patient Instructions (Signed)
[facility] AT [address]   Discharge Instructions: Thank you for choosing [facility] to provide your oncology and hematology care.   If you have a lab appointment with the [facility], please go directly to the [facility] and check in at the registration area.   Wear comfortable clothing and clothing appropriate for easy access to any Portacath or PICC line.   We strive to give you quality time with your provider. You may need to reschedule your appointment if you arrive late (15 or more minutes).  Arriving late affects you and other patients whose appointments are after yours.  Also, if you miss three or more appointments without notifying the office, you may be dismissed from the clinic at the provider's discretion.      For prescription refill requests, have your pharmacy contact our office and allow 72 hours for refills to be completed.    Today you received the following chemotherapy and/or immunotherapy agents Paclitaxel-protein bound (ABRAXANE) & Gemcitabine (GEMZAR).      To help prevent nausea and vomiting after your treatment, we encourage you to take your nausea medication as directed.  BELOW ARE SYMPTOMS THAT SHOULD BE REPORTED IMMEDIATELY: *FEVER GREATER THAN 100.4 F (38 C) OR HIGHER *CHILLS OR SWEATING *NAUSEA AND VOMITING THAT IS NOT CONTROLLED WITH YOUR NAUSEA MEDICATION *UNUSUAL SHORTNESS OF BREATH *UNUSUAL BRUISING OR BLEEDING *URINARY PROBLEMS (pain or burning when urinating, or frequent urination) *BOWEL PROBLEMS (unusual diarrhea, constipation, pain near the anus) TENDERNESS IN MOUTH AND THROAT WITH OR WITHOUT PRESENCE OF ULCERS (sore throat, sores in mouth, or a toothache) UNUSUAL RASH, SWELLING OR PAIN  UNUSUAL VAGINAL DISCHARGE OR ITCHING   Items with * indicate a potential emergency and should be followed up as soon as possible or go to the Emergency Department if any problems should occur.  Please show the  CHEMOTHERAPY ALERT CARD or IMMUNOTHERAPY ALERT CARD at check-in to the Emergency Department and triage nurse.  Should you have questions after your visit or need to cancel or reschedule your appointment, please contact [facility] AT [address]  Dept: [phone]  and follow the prompts.  Office hours are 8:00 a.m. to 4:30 p.m. Monday - Friday. Please note that voicemails left after 4:00 p.m. may not be returned until the following business day.  We are closed weekends and major holidays. You have access to a nurse at all times for urgent questions. Please call the main number to the clinic Dept: [phone] and follow the prompts.   For any non-urgent questions, you may also contact your provider using MyChart. We now offer e-Visits for anyone 18 and older to request care online for non-urgent symptoms. For details visit [email or web address].   Also download the MyChart app! Go to the app store, search "MyChart", open the app, select [facility], and log in with your MyChart username and password.  Paclitaxel Nanoparticle Albumin-Bound Injection What is this medication? NANOPARTICLE ALBUMIN-BOUND PACLITAXEL (Na no PAHR ti kuhl al BYOO muhn-bound PAK li TAX el) treats some types of cancer. It works by slowing down the growth of cancer cells. This medicine may be used for other purposes; ask your health care provider or pharmacist if you have questions. COMMON BRAND NAME(S): Abraxane What should I tell my care team before I take this medication? They need to know if you have any of these conditions: Liver disease Low white blood cell levels An unusual or allergic reaction to paclitaxel, albumin, other medications, foods,   dyes, or preservatives If you or your partner are pregnant or trying to get pregnant Breast-feeding How should I use this medication? This medication is injected into a vein. It is given by your care team in a hospital or clinic setting. Talk to your  care team about the use of this medication in children. Special care may be needed. Overdosage: If you think you have taken too much of this medicine contact a poison control [facility] or emergency room at once. NOTE: This medicine is only for you. Do not share this medicine with others. What if I miss a dose? Keep appointments for follow-up doses. It is important not to miss your dose. Call your care team if you are unable to keep an appointment. What may interact with this medication? Other medications may affect the way this medication works. Talk with your care team about all of the medications you take. They may suggest changes to your treatment plan to lower the risk of side effects and to make sure your medications work as intended. This list may not describe all possible interactions. Give your health care provider a list of all the medicines, herbs, non-prescription drugs, or dietary supplements you use. Also tell them if you smoke, drink alcohol, or use illegal drugs. Some items may interact with your medicine. What should I watch for while using this medication? Your condition will be monitored carefully while you are receiving this medication. You may need blood work while taking this medication. This medication may make you feel generally unwell. This is not uncommon as chemotherapy can affect healthy cells as well as cancer cells. Report any side effects. Continue your course of treatment even though you feel ill unless your care team tells you to stop. This medication can cause serious allergic reactions. To reduce the risk, your care team may give you other medications to take before receiving this one. Be sure to follow the directions from your care team. This medication may increase your risk of getting an infection. Call your care team for advice if you get a fever, chills, sore throat, or other symptoms of a cold or flu. Do not treat yourself. Try to avoid being around people who are  sick. This medication may increase your risk to bruise or bleed. Call your care team if you notice any unusual bleeding. Be careful brushing or flossing your teeth or using a toothpick because you may get an infection or bleed more easily. If you have any dental work done, tell your dentist you are receiving this medication. Talk to your care team if you or your partner may be pregnant. Serious birth defects can occur if you take this medication during pregnancy and for 6 months after the last dose. You will need a negative pregnancy test before starting this medication. Contraception is recommended while taking this medication and for 6 months after the last dose. Your care team can help you find the option that works for you. If your partner can get pregnant, use a condom during sex while taking this medication and for 3 months after the last dose. Do not breastfeed while taking this medication and for 2 weeks after the last dose. This medication may cause infertility. Talk to your care team if you are concerned about your fertility. What side effects may I notice from receiving this medication? Side effects that you should report to your care team as soon as possible: Allergic reactions--skin rash, itching, hives, swelling of the face, lips, tongue, or   throat Dry cough, shortness of breath or trouble breathing Infection--fever, chills, cough, sore throat, wounds that don't heal, pain or trouble when passing urine, general feeling of discomfort or being unwell Low red blood cell level--unusual weakness or fatigue, dizziness, headache, trouble breathing Pain, tingling, or numbness in the hands or feet Stomach pain, unusual weakness or fatigue, nausea, vomiting, diarrhea, or fever that lasts longer than expected Unusual bruising or bleeding Side effects that usually do not require medical attention (report to your care team if they continue or are bothersome): Diarrhea Fatigue Hair loss Loss of  appetite Nausea Vomiting This list may not describe all possible side effects. Call your doctor for medical advice about side effects. You may report side effects to FDA at 1-800-FDA-1088. Where should I keep my medication? This medication is given in a hospital or clinic. It will not be stored at home. NOTE: This sheet is a summary. It may not cover all possible information. If you have questions about this medicine, talk to your doctor, pharmacist, or health care provider.  2023 Elsevier/Gold Standard (2007-10-25 00:00:00)  Gemcitabine Injection What is this medication? GEMCITABINE (jem SYE ta been) treats some types of cancer. It works by slowing down the growth of cancer cells. This medicine may be used for other purposes; ask your health care provider or pharmacist if you have questions. COMMON BRAND NAME(S): Gemzar, Infugem What should I tell my care team before I take this medication? They need to know if you have any of these conditions: Blood disorders Infection Kidney disease Liver disease Lung or breathing disease, such as asthma or COPD Recent or ongoing radiation therapy An unusual or allergic reaction to gemcitabine, other medications, foods, dyes, or preservatives If you or your partner are pregnant or trying to get pregnant Breast-feeding How should I use this medication? This medication is injected into a vein. It is given by your care team in a hospital or clinic setting. Talk to your care team about the use of this medication in children. Special care may be needed. Overdosage: If you think you have taken too much of this medicine contact a poison control [facility] or emergency room at once. NOTE: This medicine is only for you. Do not share this medicine with others. What if I miss a dose? Keep appointments for follow-up doses. It is important not to miss your dose. Call your care team if you are unable to keep an appointment. What may interact with this  medication? Interactions have not been studied. This list may not describe all possible interactions. Give your health care provider a list of all the medicines, herbs, non-prescription drugs, or dietary supplements you use. Also tell them if you smoke, drink alcohol, or use illegal drugs. Some items may interact with your medicine. What should I watch for while using this medication? Your condition will be monitored carefully while you are receiving this medication. This medication may make you feel generally unwell. This is not uncommon, as chemotherapy can affect healthy cells as well as cancer cells. Report any side effects. Continue your course of treatment even though you feel ill unless your care team tells you to stop. In some cases, you may be given additional medications to help with side effects. Follow all directions for their use. This medication may increase your risk of getting an infection. Call your care team for advice if you get a fever, chills, sore throat, or other symptoms of a cold or flu. Do not treat yourself. Try to   avoid being around people who are sick. This medication may increase your risk to bruise or bleed. Call your care team if you notice any unusual bleeding. Be careful brushing or flossing your teeth or using a toothpick because you may get an infection or bleed more easily. If you have any dental work done, tell your dentist you are receiving this medication. Avoid taking medications that contain aspirin, acetaminophen, ibuprofen, naproxen, or ketoprofen unless instructed by your care team. These medications may hide a fever. Talk to your care team if you or your partner wish to become pregnant or think you might be pregnant. This medication can cause serious birth defects if taken during pregnancy and for 6 months after the last dose. A negative pregnancy test is required before starting this medication. A reliable form of contraception is recommended while taking  this medication and for 6 months after the last dose. Talk to your care team about effective forms of contraception. Do not father a child while taking this medication and for 3 months after the last dose. Use a condom while having sex during this time period. Do not breastfeed while taking this medication and for at least 1 week after the last dose. This medication may cause infertility. Talk to your care team if you are concerned about your fertility. What side effects may I notice from receiving this medication? Side effects that you should report to your care team as soon as possible: Allergic reactions--skin rash, itching, hives, swelling of the face, lips, tongue, or throat Capillary leak syndrome--stomach or muscle pain, unusual weakness or fatigue, feeling faint or lightheaded, decrease in the amount of urine, swelling of the ankles, hands, or feet, trouble breathing Infection--fever, chills, cough, sore throat, wounds that don't heal, pain or trouble when passing urine, general feeling of discomfort or being unwell Liver injury--right upper belly pain, loss of appetite, nausea, light-colored stool, dark yellow or brown urine, yellowing skin or eyes, unusual weakness or fatigue Low red blood cell level--unusual weakness or fatigue, dizziness, headache, trouble breathing Lung injury--shortness of breath or trouble breathing, cough, spitting up blood, chest pain, fever Stomach pain, bloody diarrhea, pale skin, unusual weakness or fatigue, decrease in the amount of urine, which may be signs of hemolytic uremic syndrome Sudden and severe headache, confusion, change in vision, seizures, which may be signs of posterior reversible encephalopathy syndrome (PRES) Unusual bruising or bleeding Side effects that usually do not require medical attention (report to your care team if they continue or are bothersome): Diarrhea Drowsiness Hair loss Nausea Pain, redness, or swelling with sores inside the  mouth or throat Vomiting This list may not describe all possible side effects. Call your doctor for medical advice about side effects. You may report side effects to FDA at 1-800-FDA-1088. Where should I keep my medication? This medication is given in a hospital or clinic. It will not be stored at home. NOTE: This sheet is a summary. It may not cover all possible information. If you have questions about this medicine, talk to your doctor, pharmacist, or health care provider.  2023 Elsevier/Gold Standard (2022-01-09 00:00:00)  

## 2023-02-01 NOTE — Progress Notes (Signed)
Patient seen by Lisa Thomas NP today  Vitals are within treatment parameters.  Labs reviewed by Lisa Thomas NP and are within treatment parameters.  Per physician team, patient is ready for treatment and there are NO modifications to the treatment plan.     

## 2023-02-01 NOTE — Progress Notes (Signed)
[facility] OFFICE PROGRESS NOTE   Diagnosis: Pancreas cancer  INTERVAL HISTORY:   [facility] returns as scheduled.  She completed another cycle of gemcitabine/Abraxane 01/18/2023.  She denies nausea/vomiting.  She typically has loose stools after returning home day 1.  Periodic mild loose stools thereafter.  No fever or rash after treatment.  No numbness or tingling in the hands or feet.  Occasional leg cramps.  Appetite continues to be poor.  Objective:  Vital signs in last 24 hours:  Blood pressure 127/66, pulse 93, temperature 98.1 F (36.7 C), temperature source Oral, resp. rate 18, height 5\' 6"  (1.676 m), weight 151 lb 6.4 oz (68.7 kg), last menstrual period 02/07/2016, SpO2 100 %.    HEENT: No thrush or ulcers.  Resp: Lungs clear bilaterally. Cardio: Regular rate and rhythm. GI: No hepatosplenomegaly.  No mass. Vascular: No leg edema. Port-A-Cath without erythema.  Lab Results:  Lab Results  Component Value Date   WBC 8.7 02/01/2023   HGB 12.5 02/01/2023   HCT 37.2 02/01/2023   MCV 96.9 02/01/2023   PLT 198 02/01/2023   NEUTROABS 5.5 02/01/2023    Imaging:  No results found.  Medications: I have reviewed the patient's current medications.  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 CT abdomen/pelvis 07/30/2022-body/neck pancreas mass with peripheral pancreatic atrophy and duct dilation, suspected encasement of the celiac axis, uterine fibroids MRI abdomen 07/30/2022-3.7 x 4.7 cm pancreas body/tail mass with encasement of the proximal celiac artery, occlusion with collateralization at the confluence of the SMV and portal vein, no evidence of metastatic disease CT chest 08/01/2022-no intrathoracic metastases Elevated CA 19-9 EUS 08/02/2022-gastric polyp, gastric erosions, 32 x 33 mm pancreas body mass, invasion of the celiac trunk, invasion of the splenoportal confluence, no SMA invasion, 2 malignant appearing nodes in the porta hepatis,T4N1 by  endoscopic criteria, FNA biopsy-adenocarcinoma 08/17/2022 [employer]-tumor mutation burden 0.96; MSI high not detected; variant of uncertain clinical significance NTRK3 T93M Cycle 1 gemcitabine/Abraxane 08/17/2022 Cycle 2 gemcitabine/Abraxane 08/31/2022 Cycle 3 gemcitabine/Abraxane 09/14/2022 Cycle 4 gemcitabine/Abraxane 09/28/2022 Cycle 5 gemcitabine/Abraxane 10/12/2022 Cycle 6 gemcitabine/Abraxane 10/26/2022 CT abdomen/pelvis 10/27/2022-stable pancreas body mass with encasement of the celiac axis and portal vein with occlusion of the splenic vein and varices.  Stable porta hepatis lymph nodes. Cycle 7 gemcitabine/Abraxane 11/09/2022 Cycle 8 gemcitabine/Abraxane 11/23/2022 Cycle 9 gemcitabine/Abraxane 12/07/2022 Cycle 10 gemcitabine/Abraxane 12/21/2022 Cycle 11 gemcitabine/Abraxane 01/04/2023 Cycle 12 gemcitabine/Abraxane 01/18/2023 Cycle 13 gemcitabine/Abraxane 02/01/2023   2.  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  Anorexia/weight loss secondary to #1 4.  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  Abdominal pain 6.  History of colon polyps 7.  Family history of multiple cancers Pancreas cancer maternal aunt, maternal cousin, maternal grandfather Breast cancer-maternal aunt, maternal cousin Prostate cancer-maternal uncle Lung cancer-father 8.  Hypertension 9.  Postmenopausal vaginal bleeding 10.  H. pylori 08/02/2022-amoxicillin and Flagyl discontinued 08/21/2022 due to GI symptoms    Disposition: [facility] appears stable.  She has completed 12 cycles of gemcitabine/Abraxane.  There is no clinical evidence of disease progression.  Most recent CA 19-9 tumor marker was lower in the normal range.  Plan to proceed with cycle 13 today as scheduled.  She will be referred for restaging CTs after the cycle of chemotherapy 02/15/2023.  CBC reviewed.  Counts adequate to proceed as above.  She will return for lab, follow-up,  chemotherapy in 2 weeks.    [name] ANP/GNP-BC   02/01/2023  9:08 AM

## 2023-02-02 LAB — CANCER ANTIGEN 19-9: CA 19-9: 16 U/mL (ref 0–35)

## 2023-02-06 ENCOUNTER — Telehealth: Payer: Self-pay

## 2023-02-06 NOTE — Patient Outreach (Signed)
  Care Coordination   02/06/2023 Name: [facility] MRN: [id number] DOB: 12/30/1965   Care Coordination Outreach Attempts:  A second unsuccessful outreach was attempted today to offer the patient with information about available care coordination services.  Follow Up Plan:  Additional outreach attempts will be made to offer the patient care coordination information and services.   Encounter Outcome:  No Answer   Care Coordination Interventions:  No, not indicated    Juanell Fairly RN, BSN, [facility] Care Coordinator [facility]   Phone: [phone]

## 2023-02-07 ENCOUNTER — Ambulatory Visit: Payer: Self-pay

## 2023-02-07 NOTE — Patient Outreach (Signed)
  Care Coordination   Follow Up Visit Note   02/07/2023 Name: [facility] MRN: [id number] DOB: April 13, 1966  [facility] is a 57 y.o. year old female who sees Masters, Florentina Addison, DO for primary care. I spoke with  [facility] by phone today.  What matters to the patients health and wellness today?  [facility] is still experiencing leg issues this week and has not been eating well. She is taking it one day at a time and has requested a follow-up in three months.     Goals Addressed             This Visit's Progress    I want to eat more and and gain weight       Patient Goals/self care activities: Maintain a healthy weight Eat mall frequent meals through out the day if possible Stay hydrated Be observant of changes in bowel habits Choose foods that are easy to digest Eat protein rich food Continue with smoothies Use rubs to message her legs to help with pain        SDOH assessments and interventions completed:  No     Care Coordination Interventions:  Yes, provided   Interventions Today    Flowsheet Row Most Recent Value  Chronic Disease   Chronic disease during today's visit Other  [Mass]  General Interventions   General Interventions Discussed/Reviewed General Interventions Discussed  Nutrition Interventions   Nutrition Discussed/Reviewed Nutrition Discussed        Follow up plan: Follow up call scheduled for 05/10/23 11 am    Encounter Outcome:  Pt. Visit Completed   Juanell Fairly RN, BSN, [facility] Care Coordinator [facility]  Phone: [phone]

## 2023-02-07 NOTE — Patient Instructions (Signed)
Visit Information  Thank you for taking time to visit with me today. Please don't hesitate to contact me if I can be of assistance to you.   Following are the goals we discussed today:   Goals Addressed             This Visit's Progress    I want to eat more and and gain weight       Patient Goals/self care activities: Maintain a healthy weight Eat mall frequent meals through out the day if possible Stay hydrated Be observant of changes in bowel habits Choose foods that are easy to digest Eat protein rich food Continue with smoothies Use rubs to message her legs to help with pain        Our next appointment is by telephone on 05/10/23 at 11 am  Please call the care guide team at [phone] if you need to cancel or reschedule your appointment.   If you are experiencing a Mental Health or Behavioral Health Crisis or need someone to talk to, please call 1-800-273-TALK (toll free, 24 hour hotline)  Patient verbalizes understanding of instructions and care plan provided today and agrees to view in MyChart. Active MyChart status and patient understanding of how to access instructions and care plan via MyChart confirmed with patient.     Juanell Fairly RN, BSN, [facility] Care Coordinator Transition of Care  Phone: [phone]

## 2023-02-08 ENCOUNTER — Other Ambulatory Visit: Payer: Self-pay

## 2023-02-10 ENCOUNTER — Other Ambulatory Visit: Payer: Self-pay | Admitting: Oncology

## 2023-02-15 ENCOUNTER — Other Ambulatory Visit: Payer: Self-pay | Admitting: *Deleted

## 2023-02-15 ENCOUNTER — Inpatient Hospital Stay: Payer: Commercial Managed Care - HMO

## 2023-02-15 ENCOUNTER — Encounter: Payer: Self-pay | Admitting: *Deleted

## 2023-02-15 ENCOUNTER — Inpatient Hospital Stay (HOSPITAL_BASED_OUTPATIENT_CLINIC_OR_DEPARTMENT_OTHER): Payer: Commercial Managed Care - HMO | Admitting: Oncology

## 2023-02-15 VITALS — BP 125/69 | HR 67

## 2023-02-15 DIAGNOSIS — Z5111 Encounter for antineoplastic chemotherapy: Secondary | ICD-10-CM | POA: Diagnosis not present

## 2023-02-15 DIAGNOSIS — C251 Malignant neoplasm of body of pancreas: Secondary | ICD-10-CM | POA: Diagnosis not present

## 2023-02-15 LAB — CBC WITH DIFFERENTIAL (CANCER CENTER ONLY)
Abs Immature Granulocytes: 0.05 10*3/uL (ref 0.00–0.07)
Basophils Absolute: 0 10*3/uL (ref 0.0–0.1)
Basophils Relative: 0 %
Eosinophils Absolute: 0.1 10*3/uL (ref 0.0–0.5)
Eosinophils Relative: 1 %
HCT: 36 % (ref 36.0–46.0)
Hemoglobin: 11.9 g/dL — ABNORMAL LOW (ref 12.0–15.0)
Immature Granulocytes: 1 %
Lymphocytes Relative: 28 %
Lymphs Abs: 2.3 10*3/uL (ref 0.7–4.0)
MCH: 32.2 pg (ref 26.0–34.0)
MCHC: 33.1 g/dL (ref 30.0–36.0)
MCV: 97.6 fL (ref 80.0–100.0)
Monocytes Absolute: 0.9 10*3/uL (ref 0.1–1.0)
Monocytes Relative: 10 %
Neutro Abs: 4.9 10*3/uL (ref 1.7–7.7)
Neutrophils Relative %: 60 %
Platelet Count: 215 10*3/uL (ref 150–400)
RBC: 3.69 MIL/uL — ABNORMAL LOW (ref 3.87–5.11)
RDW: 18.2 % — ABNORMAL HIGH (ref 11.5–15.5)
WBC Count: 8.3 10*3/uL (ref 4.0–10.5)
nRBC: 0 % (ref 0.0–0.2)

## 2023-02-15 LAB — CMP (CANCER CENTER ONLY)
ALT: 14 U/L (ref 0–44)
AST: 19 U/L (ref 15–41)
Albumin: 3.4 g/dL — ABNORMAL LOW (ref 3.5–5.0)
Alkaline Phosphatase: 58 U/L (ref 38–126)
Anion gap: 7 (ref 5–15)
BUN: 15 mg/dL (ref 6–20)
CO2: 29 mmol/L (ref 22–32)
Calcium: 9.2 mg/dL (ref 8.9–10.3)
Chloride: 106 mmol/L (ref 98–111)
Creatinine: 0.87 mg/dL (ref 0.44–1.00)
GFR, Estimated: >60 mL/min (ref >60)
Glucose, Bld: 143 mg/dL — ABNORMAL HIGH (ref 70–99)
Potassium: 3.6 mmol/L (ref 3.5–5.1)
Sodium: 142 mmol/L (ref 135–145)
Total Bilirubin: 0.5 mg/dL (ref 0.3–1.2)
Total Protein: 6.5 g/dL (ref 6.5–8.1)

## 2023-02-15 MED ORDER — HEPARIN SOD (PORK) LOCK FLUSH 100 UNIT/ML IV SOLN
500.0000 [IU] | Freq: Once | INTRAVENOUS | Status: AC | PRN
  Administered 2023-02-15: 500 [IU]

## 2023-02-15 MED ORDER — ALBUTEROL SULFATE HFA 108 (90 BASE) MCG/ACT IN AERS: 2.0000 | INHALATION_SPRAY | Freq: Four times a day (QID) | RESPIRATORY_TRACT | 1 refills | Status: DC | PRN

## 2023-02-15 MED ORDER — PROCHLORPERAZINE MALEATE 10 MG PO TABS
10.0000 mg | ORAL_TABLET | Freq: Once | ORAL | Status: AC
  Administered 2023-02-15: 10 mg via ORAL
  Filled 2023-02-15: qty 1

## 2023-02-15 MED ORDER — SODIUM CHLORIDE 0.9 % IV SOLN: Freq: Once | INTRAVENOUS | Status: AC

## 2023-02-15 MED ORDER — SODIUM CHLORIDE 0.9 % IV SOLN
1000.0000 mg/m2 | Freq: Once | INTRAVENOUS | Status: AC
  Administered 2023-02-15: 1786 mg via INTRAVENOUS
  Filled 2023-02-15: qty 26.28

## 2023-02-15 MED ORDER — PREDNISONE 50 MG PO TABS: ORAL_TABLET | ORAL | 0 refills | Status: DC

## 2023-02-15 MED ORDER — SODIUM CHLORIDE 0.9% FLUSH
10.0000 mL | INTRAVENOUS | Status: DC | PRN
  Administered 2023-02-15: 10 mL

## 2023-02-15 MED ORDER — PACLITAXEL PROTEIN-BOUND CHEMO INJECTION 100 MG
100.0000 mg/m2 | Freq: Once | INTRAVENOUS | Status: AC
  Administered 2023-02-15: 200 mg via INTRAVENOUS
  Filled 2023-02-15: qty 40

## 2023-02-15 NOTE — Patient Instructions (Signed)
[facility]   Discharge Instructions: Thank you for choosing [facility] to provide your oncology and hematology care.   If you have a lab appointment with the Cancer Center, please go directly to the Cancer Center and check in at the registration area.   Wear comfortable clothing and clothing appropriate for easy access to any Portacath or PICC line.   We strive to give you quality time with your provider. You may need to reschedule your appointment if you arrive late (15 or more minutes).  Arriving late affects you and other patients whose appointments are after yours.  Also, if you miss three or more appointments without notifying the office, you may be dismissed from the clinic at the provider's discretion.      For prescription refill requests, have your pharmacy contact our office and allow 72 hours for refills to be completed.    Today you received the following chemotherapy and/or immunotherapy agents Paclitaxel-protein bound (ABRAXANE) & Gemcitabine (GEMZAR).      To help prevent nausea and vomiting after your treatment, we encourage you to take your nausea medication as directed.  BELOW ARE SYMPTOMS THAT SHOULD BE REPORTED IMMEDIATELY: *FEVER GREATER THAN 100.4 F (38 C) OR HIGHER *CHILLS OR SWEATING *NAUSEA AND VOMITING THAT IS NOT CONTROLLED WITH YOUR NAUSEA MEDICATION *UNUSUAL SHORTNESS OF BREATH *UNUSUAL BRUISING OR BLEEDING *URINARY PROBLEMS (pain or burning when urinating, or frequent urination) *BOWEL PROBLEMS (unusual diarrhea, constipation, pain near the anus) TENDERNESS IN MOUTH AND THROAT WITH OR WITHOUT PRESENCE OF ULCERS (sore throat, sores in mouth, or a toothache) UNUSUAL RASH, SWELLING OR PAIN  UNUSUAL VAGINAL DISCHARGE OR ITCHING   Items with * indicate a potential emergency and should be followed up as soon as possible or go to the Emergency Department if any problems should occur.  Please show the  CHEMOTHERAPY ALERT CARD or IMMUNOTHERAPY ALERT CARD at check-in to the Emergency Department and triage nurse.  Should you have questions after your visit or need to cancel or reschedule your appointment, please contact [facility]  Dept: [phone]  and follow the prompts.  Office hours are 8:00 a.m. to 4:30 p.m. Monday - Friday. Please note that voicemails left after 4:00 p.m. may not be returned until the following business day.  We are closed weekends and major holidays. You have access to a nurse at all times for urgent questions. Please call the main number to the clinic Dept: [phone] and follow the prompts.   For any non-urgent questions, you may also contact your provider using MyChart. We now offer e-Visits for anyone 18 and older to request care online for non-urgent symptoms. For details visit [email or web address].   Also download the MyChart app! Go to the app store, search "MyChart", open the app, select [place], and log in with your MyChart username and password.  Paclitaxel Nanoparticle Albumin-Bound Injection What is this medication? NANOPARTICLE ALBUMIN-BOUND PACLITAXEL (Na no PAHR ti kuhl al BYOO muhn-bound PAK li TAX el) treats some types of cancer. It works by slowing down the growth of cancer cells. This medicine may be used for other purposes; ask your health care provider or pharmacist if you have questions. COMMON BRAND NAME(S): Abraxane What should I tell my care team before I take this medication? They need to know if you have any of these conditions: Liver disease Low white blood cell levels An unusual or allergic reaction to paclitaxel, albumin, other medications, foods,   dyes, or preservatives If you or your partner are pregnant or trying to get pregnant Breast-feeding How should I use this medication? This medication is injected into a vein. It is given by your care team in a hospital or clinic setting. Talk to your  care team about the use of this medication in children. Special care may be needed. Overdosage: If you think you have taken too much of this medicine contact a poison control center or emergency room at once. NOTE: This medicine is only for you. Do not share this medicine with others. What if I miss a dose? Keep appointments for follow-up doses. It is important not to miss your dose. Call your care team if you are unable to keep an appointment. What may interact with this medication? Other medications may affect the way this medication works. Talk with your care team about all of the medications you take. They may suggest changes to your treatment plan to lower the risk of side effects and to make sure your medications work as intended. This list may not describe all possible interactions. Give your health care provider a list of all the medicines, herbs, non-prescription drugs, or dietary supplements you use. Also tell them if you smoke, drink alcohol, or use illegal drugs. Some items may interact with your medicine. What should I watch for while using this medication? Your condition will be monitored carefully while you are receiving this medication. You may need blood work while taking this medication. This medication may make you feel generally unwell. This is not uncommon as chemotherapy can affect healthy cells as well as cancer cells. Report any side effects. Continue your course of treatment even though you feel ill unless your care team tells you to stop. This medication can cause serious allergic reactions. To reduce the risk, your care team may give you other medications to take before receiving this one. Be sure to follow the directions from your care team. This medication may increase your risk of getting an infection. Call your care team for advice if you get a fever, chills, sore throat, or other symptoms of a cold or flu. Do not treat yourself. Try to avoid being around people who are  sick. This medication may increase your risk to bruise or bleed. Call your care team if you notice any unusual bleeding. Be careful brushing or flossing your teeth or using a toothpick because you may get an infection or bleed more easily. If you have any dental work done, tell your dentist you are receiving this medication. Talk to your care team if you or your partner may be pregnant. Serious birth defects can occur if you take this medication during pregnancy and for 6 months after the last dose. You will need a negative pregnancy test before starting this medication. Contraception is recommended while taking this medication and for 6 months after the last dose. Your care team can help you find the option that works for you. If your partner can get pregnant, use a condom during sex while taking this medication and for 3 months after the last dose. Do not breastfeed while taking this medication and for 2 weeks after the last dose. This medication may cause infertility. Talk to your care team if you are concerned about your fertility. What side effects may I notice from receiving this medication? Side effects that you should report to your care team as soon as possible: Allergic reactions--skin rash, itching, hives, swelling of the face, lips, tongue, or   throat Dry cough, shortness of breath or trouble breathing Infection--fever, chills, cough, sore throat, wounds that don't heal, pain or trouble when passing urine, general feeling of discomfort or being unwell Low red blood cell level--unusual weakness or fatigue, dizziness, headache, trouble breathing Pain, tingling, or numbness in the hands or feet Stomach pain, unusual weakness or fatigue, nausea, vomiting, diarrhea, or fever that lasts longer than expected Unusual bruising or bleeding Side effects that usually do not require medical attention (report to your care team if they continue or are bothersome): Diarrhea Fatigue Hair loss Loss of  appetite Nausea Vomiting This list may not describe all possible side effects. Call your doctor for medical advice about side effects. You may report side effects to FDA at 1-800-FDA-1088. Where should I keep my medication? This medication is given in a hospital or clinic. It will not be stored at home. NOTE: This sheet is a summary. It may not cover all possible information. If you have questions about this medicine, talk to your doctor, pharmacist, or health care provider.  2023 Elsevier/Gold Standard (2007-10-25 00:00:00)  Gemcitabine Injection What is this medication? GEMCITABINE (jem SYE ta been) treats some types of cancer. It works by slowing down the growth of cancer cells. This medicine may be used for other purposes; ask your health care provider or pharmacist if you have questions. COMMON BRAND NAME(S): Gemzar, Infugem What should I tell my care team before I take this medication? They need to know if you have any of these conditions: Blood disorders Infection Kidney disease Liver disease Lung or breathing disease, such as asthma or COPD Recent or ongoing radiation therapy An unusual or allergic reaction to gemcitabine, other medications, foods, dyes, or preservatives If you or your partner are pregnant or trying to get pregnant Breast-feeding How should I use this medication? This medication is injected into a vein. It is given by your care team in a hospital or clinic setting. Talk to your care team about the use of this medication in children. Special care may be needed. Overdosage: If you think you have taken too much of this medicine contact a poison control center or emergency room at once. NOTE: This medicine is only for you. Do not share this medicine with others. What if I miss a dose? Keep appointments for follow-up doses. It is important not to miss your dose. Call your care team if you are unable to keep an appointment. What may interact with this  medication? Interactions have not been studied. This list may not describe all possible interactions. Give your health care provider a list of all the medicines, herbs, non-prescription drugs, or dietary supplements you use. Also tell them if you smoke, drink alcohol, or use illegal drugs. Some items may interact with your medicine. What should I watch for while using this medication? Your condition will be monitored carefully while you are receiving this medication. This medication may make you feel generally unwell. This is not uncommon, as chemotherapy can affect healthy cells as well as cancer cells. Report any side effects. Continue your course of treatment even though you feel ill unless your care team tells you to stop. In some cases, you may be given additional medications to help with side effects. Follow all directions for their use. This medication may increase your risk of getting an infection. Call your care team for advice if you get a fever, chills, sore throat, or other symptoms of a cold or flu. Do not treat yourself. Try to   avoid being around people who are sick. This medication may increase your risk to bruise or bleed. Call your care team if you notice any unusual bleeding. Be careful brushing or flossing your teeth or using a toothpick because you may get an infection or bleed more easily. If you have any dental work done, tell your dentist you are receiving this medication. Avoid taking medications that contain aspirin, acetaminophen, ibuprofen, naproxen, or ketoprofen unless instructed by your care team. These medications may hide a fever. Talk to your care team if you or your partner wish to become pregnant or think you might be pregnant. This medication can cause serious birth defects if taken during pregnancy and for 6 months after the last dose. A negative pregnancy test is required before starting this medication. A reliable form of contraception is recommended while taking  this medication and for 6 months after the last dose. Talk to your care team about effective forms of contraception. Do not father a child while taking this medication and for 3 months after the last dose. Use a condom while having sex during this time period. Do not breastfeed while taking this medication and for at least 1 week after the last dose. This medication may cause infertility. Talk to your care team if you are concerned about your fertility. What side effects may I notice from receiving this medication? Side effects that you should report to your care team as soon as possible: Allergic reactions--skin rash, itching, hives, swelling of the face, lips, tongue, or throat Capillary leak syndrome--stomach or muscle pain, unusual weakness or fatigue, feeling faint or lightheaded, decrease in the amount of urine, swelling of the ankles, hands, or feet, trouble breathing Infection--fever, chills, cough, sore throat, wounds that don't heal, pain or trouble when passing urine, general feeling of discomfort or being unwell Liver injury--right upper belly pain, loss of appetite, nausea, light-colored stool, dark yellow or brown urine, yellowing skin or eyes, unusual weakness or fatigue Low red blood cell level--unusual weakness or fatigue, dizziness, headache, trouble breathing Lung injury--shortness of breath or trouble breathing, cough, spitting up blood, chest pain, fever Stomach pain, bloody diarrhea, pale skin, unusual weakness or fatigue, decrease in the amount of urine, which may be signs of hemolytic uremic syndrome Sudden and severe headache, confusion, change in vision, seizures, which may be signs of posterior reversible encephalopathy syndrome (PRES) Unusual bruising or bleeding Side effects that usually do not require medical attention (report to your care team if they continue or are bothersome): Diarrhea Drowsiness Hair loss Nausea Pain, redness, or swelling with sores inside the  mouth or throat Vomiting This list may not describe all possible side effects. Call your doctor for medical advice about side effects. You may report side effects to FDA at 1-800-FDA-1088. Where should I keep my medication? This medication is given in a hospital or clinic. It will not be stored at home. NOTE: This sheet is a summary. It may not cover all possible information. If you have questions about this medicine, talk to your doctor, pharmacist, or health care provider.  2023 Elsevier/Gold Standard (2022-01-09 00:00:00)  

## 2023-02-15 NOTE — Progress Notes (Signed)
Patient seen by Dr. Sherrill today ? ?Vitals are within treatment parameters. ? ?Labs reviewed by Dr. Sherrill and are within treatment parameters. ? ?Per physician team, patient is ready for treatment and there are NO modifications to the treatment plan.  ?

## 2023-02-15 NOTE — Patient Instructions (Signed)

## 2023-02-15 NOTE — Progress Notes (Signed)
[facility] OFFICE PROGRESS NOTE   Diagnosis: Pancreas cancer  INTERVAL HISTORY:   [name] returns as scheduled.  She completed another cycle of gemcitabine/Abraxane on 02/01/2023.  No nausea, fever, or rash.  She has occasional tingling in the hands, no consistent neuropathy symptoms.  She has leg cramps.  She continues to have anorexia.  She is drinking 6 cans of Ensure daily.  No abdominal pain.  Objective:  Vital signs in last 24 hours:  Blood pressure 139/62, pulse 82, temperature 97.8 F (36.6 C), temperature source Oral, resp. rate 18, weight 149 lb (67.6 kg), last menstrual period 02/07/2016, SpO2 98 %.    HEENT: No thrush or ulcers Resp: Lungs clear bilaterally Cardio: Regular rate and rhythm GI: Soft, no mass, no apparent ascites, mild tenderness in the mid and bilateral upper abdomen Vascular: No leg edema  Skin: Hyperpigmentation and dryness over the back, hyperpigmentation of the hands  Portacath/PICC-without erythema  Lab Results:  Lab Results  Component Value Date   WBC 8.3 02/15/2023   HGB 11.9 (L) 02/15/2023   HCT 36.0 02/15/2023   MCV 97.6 02/15/2023   PLT 215 02/15/2023   NEUTROABS 4.9 02/15/2023    CMP  Lab Results  Component Value Date   NA 142 02/15/2023   K 3.6 02/15/2023   CL 106 02/15/2023   CO2 29 02/15/2023   GLUCOSE 143 (H) 02/15/2023   BUN 15 02/15/2023   CREATININE 0.87 02/15/2023   CALCIUM 9.2 02/15/2023   PROT 6.5 02/15/2023   ALBUMIN 3.4 (L) 02/15/2023   AST 19 02/15/2023   ALT 14 02/15/2023   ALKPHOS 58 02/15/2023   BILITOT 0.5 02/15/2023   GFRNONAA >60 02/15/2023   GFRAA >60 02/29/2020    Lab Results  Component Value Date   CEA1 27.4 (H) 07/31/2022   WGN562 16 02/01/2023    Medications: I have reviewed the patient's current medications.   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 CT abdomen/pelvis 07/30/2022-body/neck pancreas mass with peripheral pancreatic atrophy and duct dilation, suspected  encasement of the celiac axis, uterine fibroids MRI abdomen 07/30/2022-3.7 x 4.7 cm pancreas body/tail mass with encasement of the proximal celiac artery, occlusion with collateralization at the confluence of the SMV and portal vein, no evidence of metastatic disease CT chest 08/01/2022-no intrathoracic metastases Elevated CA 19-9 EUS 08/02/2022-gastric polyp, gastric erosions, 32 x 33 mm pancreas body mass, invasion of the celiac trunk, invasion of the splenoportal confluence, no SMA invasion, 2 malignant appearing nodes in the porta hepatis,T4N1 by endoscopic criteria, FNA biopsy-adenocarcinoma 08/17/2022 [employer]-tumor mutation burden 0.96; MSI high not detected; variant of uncertain clinical significance NTRK3 T93M Cycle 1 gemcitabine/Abraxane 08/17/2022 Cycle 2 gemcitabine/Abraxane 08/31/2022 Cycle 3 gemcitabine/Abraxane 09/14/2022 Cycle 4 gemcitabine/Abraxane 09/28/2022 Cycle 5 gemcitabine/Abraxane 10/12/2022 Cycle 6 gemcitabine/Abraxane 10/26/2022 CT abdomen/pelvis 10/27/2022-stable pancreas body mass with encasement of the celiac axis and portal vein with occlusion of the splenic vein and varices.  Stable porta hepatis lymph nodes. Cycle 7 gemcitabine/Abraxane 11/09/2022 Cycle 8 gemcitabine/Abraxane 11/23/2022 Cycle 9 gemcitabine/Abraxane 12/07/2022 Cycle 10 gemcitabine/Abraxane 12/21/2022 Cycle 11 gemcitabine/Abraxane 01/04/2023 Cycle 12 gemcitabine/Abraxane 01/18/2023 Cycle 13 gemcitabine/Abraxane 02/01/2023 Cycle 14 gemcitabine/Abraxane 02/15/2023   2.  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  Anorexia/weight loss secondary to #1 4.  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  Abdominal pain 6.  History of colon polyps 7.  Family history of multiple cancers Pancreas cancer maternal aunt, maternal cousin, maternal grandfather Breast cancer-maternal aunt, maternal cousin Prostate cancer-maternal uncle  Lung  cancer-father 8.  Hypertension 9.  Postmenopausal vaginal bleeding 10.  H. pylori 08/02/2022-amoxicillin and Flagyl discontinued 08/21/2022 due to GI symptoms     Disposition: [name] appears stable.  She continues to tolerate the gemcitabine/Abraxane well.  The CA 19-9 is normal.  She will complete another cycle of gemcitabine/Abraxane today.  She will undergo a restaging CT evaluation after this cycle.  She will receive a premedication protocol for the CT due to the history of a contrast allergy.  She reports no allergic reaction when she received premedication prior to the last CT.  Thornton Papas, MD  02/15/2023  10:40 AM

## 2023-02-15 NOTE — Progress Notes (Signed)
Provided [facility] the appointment information on her CT scan at [facility] on 6/11 and how to take her premeds. Script sent to her [facility].

## 2023-02-19 LAB — CANCER ANTIGEN 19-9: CA 19-9: 15 U/mL (ref 0–35)

## 2023-02-24 ENCOUNTER — Other Ambulatory Visit: Payer: Self-pay | Admitting: Oncology

## 2023-02-26 ENCOUNTER — Ambulatory Visit (HOSPITAL_BASED_OUTPATIENT_CLINIC_OR_DEPARTMENT_OTHER)
Admission: RE | Admit: 2023-02-26 | Discharge: 2023-02-26 | Disposition: A | Discharge status: Home / Self Care | Payer: Commercial Managed Care - HMO | Source: Ambulatory Visit | Attending: Oncology | Admitting: Oncology

## 2023-02-26 ENCOUNTER — Inpatient Hospital Stay: Payer: Commercial Managed Care - HMO | Attending: Nurse Practitioner

## 2023-02-26 ENCOUNTER — Encounter (HOSPITAL_BASED_OUTPATIENT_CLINIC_OR_DEPARTMENT_OTHER): Payer: Self-pay

## 2023-02-26 DIAGNOSIS — Z8042 Family history of malignant neoplasm of prostate: Secondary | ICD-10-CM | POA: Insufficient documentation

## 2023-02-26 DIAGNOSIS — C251 Malignant neoplasm of body of pancreas: Secondary | ICD-10-CM | POA: Insufficient documentation

## 2023-02-26 DIAGNOSIS — Z803 Family history of malignant neoplasm of breast: Secondary | ICD-10-CM | POA: Insufficient documentation

## 2023-02-26 DIAGNOSIS — Z801 Family history of malignant neoplasm of trachea, bronchus and lung: Secondary | ICD-10-CM | POA: Insufficient documentation

## 2023-02-26 DIAGNOSIS — Z5111 Encounter for antineoplastic chemotherapy: Secondary | ICD-10-CM | POA: Insufficient documentation

## 2023-02-26 DIAGNOSIS — Z800 Family history of malignant neoplasm of digestive organs: Secondary | ICD-10-CM | POA: Insufficient documentation

## 2023-02-26 DIAGNOSIS — R630 Anorexia: Secondary | ICD-10-CM | POA: Insufficient documentation

## 2023-02-26 MED ORDER — HEPARIN SOD (PORK) LOCK FLUSH 100 UNIT/ML IV SOLN
500.0000 [IU] | Freq: Once | INTRAVENOUS | Status: AC
  Administered 2023-02-26: 500 [IU] via INTRAVENOUS

## 2023-02-26 MED ORDER — IOHEXOL 300 MG/ML  SOLN
100.0000 mL | Freq: Once | INTRAMUSCULAR | Status: AC | PRN
  Administered 2023-02-26: 60 mL via INTRAVENOUS

## 2023-03-01 ENCOUNTER — Inpatient Hospital Stay (HOSPITAL_BASED_OUTPATIENT_CLINIC_OR_DEPARTMENT_OTHER): Payer: Commercial Managed Care - HMO | Admitting: Oncology

## 2023-03-01 ENCOUNTER — Inpatient Hospital Stay: Payer: Commercial Managed Care - HMO

## 2023-03-01 VITALS — BP 149/67 | HR 86 | Temp 98.1°F | Resp 18 | Ht 66.0 in | Wt 150.9 lb

## 2023-03-01 VITALS — BP 120/67 | HR 66 | Resp 18

## 2023-03-01 DIAGNOSIS — C251 Malignant neoplasm of body of pancreas: Secondary | ICD-10-CM

## 2023-03-01 DIAGNOSIS — Z8042 Family history of malignant neoplasm of prostate: Secondary | ICD-10-CM | POA: Diagnosis not present

## 2023-03-01 DIAGNOSIS — Z5111 Encounter for antineoplastic chemotherapy: Secondary | ICD-10-CM | POA: Diagnosis present

## 2023-03-01 DIAGNOSIS — Z803 Family history of malignant neoplasm of breast: Secondary | ICD-10-CM | POA: Diagnosis not present

## 2023-03-01 DIAGNOSIS — Z801 Family history of malignant neoplasm of trachea, bronchus and lung: Secondary | ICD-10-CM | POA: Diagnosis not present

## 2023-03-01 DIAGNOSIS — R630 Anorexia: Secondary | ICD-10-CM | POA: Diagnosis not present

## 2023-03-01 DIAGNOSIS — Z800 Family history of malignant neoplasm of digestive organs: Secondary | ICD-10-CM | POA: Diagnosis not present

## 2023-03-01 LAB — CMP (CANCER CENTER ONLY)
ALT: 21 U/L (ref 0–44)
AST: 21 U/L (ref 15–41)
Albumin: 3.5 g/dL (ref 3.5–5.0)
Alkaline Phosphatase: 66 U/L (ref 38–126)
Anion gap: 7 (ref 5–15)
BUN: 19 mg/dL (ref 6–20)
CO2: 30 mmol/L (ref 22–32)
Calcium: 9.6 mg/dL (ref 8.9–10.3)
Chloride: 105 mmol/L (ref 98–111)
Creatinine: 0.77 mg/dL (ref 0.44–1.00)
GFR, Estimated: >60 mL/min (ref >60)
Glucose, Bld: 90 mg/dL (ref 70–99)
Potassium: 3.8 mmol/L (ref 3.5–5.1)
Sodium: 142 mmol/L (ref 135–145)
Total Bilirubin: 0.5 mg/dL (ref 0.3–1.2)
Total Protein: 6.5 g/dL (ref 6.5–8.1)

## 2023-03-01 LAB — CBC WITH DIFFERENTIAL (CANCER CENTER ONLY)
Abs Immature Granulocytes: 0.04 10*3/uL (ref 0.00–0.07)
Basophils Absolute: 0 10*3/uL (ref 0.0–0.1)
Basophils Relative: 0 %
Eosinophils Absolute: 0.2 10*3/uL (ref 0.0–0.5)
Eosinophils Relative: 2 %
HCT: 36.5 % (ref 36.0–46.0)
Hemoglobin: 12.3 g/dL (ref 12.0–15.0)
Immature Granulocytes: 0 %
Lymphocytes Relative: 25 %
Lymphs Abs: 2.4 10*3/uL (ref 0.7–4.0)
MCH: 33 pg (ref 26.0–34.0)
MCHC: 33.7 g/dL (ref 30.0–36.0)
MCV: 97.9 fL (ref 80.0–100.0)
Monocytes Absolute: 1 10*3/uL (ref 0.1–1.0)
Monocytes Relative: 10 %
Neutro Abs: 6.1 10*3/uL (ref 1.7–7.7)
Neutrophils Relative %: 63 %
Platelet Count: 221 10*3/uL (ref 150–400)
RBC: 3.73 MIL/uL — ABNORMAL LOW (ref 3.87–5.11)
RDW: 18.8 % — ABNORMAL HIGH (ref 11.5–15.5)
WBC Count: 9.8 10*3/uL (ref 4.0–10.5)
nRBC: 0 % (ref 0.0–0.2)

## 2023-03-01 MED ORDER — SODIUM CHLORIDE 0.9 % IV SOLN: Freq: Once | INTRAVENOUS | Status: AC

## 2023-03-01 MED ORDER — PROCHLORPERAZINE MALEATE 10 MG PO TABS
10.0000 mg | ORAL_TABLET | Freq: Once | ORAL | Status: AC
  Administered 2023-03-01: 10 mg via ORAL
  Filled 2023-03-01: qty 1

## 2023-03-01 MED ORDER — SODIUM CHLORIDE 0.9 % IV SOLN
1000.0000 mg/m2 | Freq: Once | INTRAVENOUS | Status: AC
  Administered 2023-03-01: 1786 mg via INTRAVENOUS
  Filled 2023-03-01: qty 26.28

## 2023-03-01 MED ORDER — SODIUM CHLORIDE 0.9% FLUSH
10.0000 mL | INTRAVENOUS | Status: DC | PRN
  Administered 2023-03-01: 10 mL

## 2023-03-01 MED ORDER — HEPARIN SOD (PORK) LOCK FLUSH 100 UNIT/ML IV SOLN
500.0000 [IU] | Freq: Once | INTRAVENOUS | Status: AC | PRN
  Administered 2023-03-01: 500 [IU]

## 2023-03-01 MED ORDER — PACLITAXEL PROTEIN-BOUND CHEMO INJECTION 100 MG
100.0000 mg/m2 | Freq: Once | INTRAVENOUS | Status: AC
  Administered 2023-03-01: 200 mg via INTRAVENOUS
  Filled 2023-03-01: qty 40

## 2023-03-01 NOTE — Patient Instructions (Signed)

## 2023-03-01 NOTE — Progress Notes (Signed)
[facility] OFFICE PROGRESS NOTE   Diagnosis: Pancreas cancer  INTERVAL HISTORY:   [name] returns as scheduled.  She completed another treatment of gemcitabine on 02/15/2023.  No rash or fever.  No neuropathy symptoms.  She continues to have a poor appetite.  She drinks approximately 6 cans of Ensure per day.  No pain.  Objective:  Vital signs in last 24 hours:  Blood pressure (!) 149/67, pulse 86, temperature 98.1 F (36.7 C), temperature source Oral, resp. rate 18, height 5\' 6"  (1.676 m), weight 150 lb 14.4 oz (68.4 kg), last menstrual period 02/07/2016, SpO2 100 %.    HEENT: No thrush or ulcers Lymphatics: No cervical, supraclavicular, axillary, or inguinal nodes Resp: Lungs clear bilaterally Cardio: Rate and rhythm GI: No hepatosplenomegaly, tender in the mid upper abdomen, no mass Vascular: No leg edema  Skin: Skin over the back  Portacath/PICC-without erythema  Lab Results:  Lab Results  Component Value Date   WBC 9.8 03/01/2023   HGB 12.3 03/01/2023   HCT 36.5 03/01/2023   MCV 97.9 03/01/2023   PLT 221 03/01/2023   NEUTROABS 6.1 03/01/2023    CMP  Lab Results  Component Value Date   NA 142 03/01/2023   K 3.8 03/01/2023   CL 105 03/01/2023   CO2 30 03/01/2023   GLUCOSE 90 03/01/2023   BUN 19 03/01/2023   CREATININE 0.77 03/01/2023   CALCIUM 9.6 03/01/2023   PROT 6.5 03/01/2023   ALBUMIN 3.5 03/01/2023   AST 21 03/01/2023   ALT 21 03/01/2023   ALKPHOS 66 03/01/2023   BILITOT 0.5 03/01/2023   GFRNONAA >60 03/01/2023   GFRAA >60 02/29/2020    Lab Results  Component Value Date   CEA1 27.4 (H) 07/31/2022   WUJ811 15 02/15/2023    Lab Results  Component Value Date   INR 1.0 07/31/2022   LABPROT 13.1 07/31/2022    Imaging:  CT Abdomen Pelvis W Wo Contrast  Result Date: 02/28/2023 CLINICAL DATA:  Restaging pancreatic cancer. History of contrast allergy. * Tracking Code: BO * EXAM: CT ABDOMEN AND PELVIS WITHOUT AND WITH  CONTRAST TECHNIQUE: Multidetector CT imaging of the abdomen and pelvis was performed following the standard protocol before and following the bolus administration of intravenous contrast. 50 milligrams Benadryl was taken be 1 hour before the scan without a breakthrough reaction. RADIATION DOSE REDUCTION: This exam was performed according to the departmental dose-optimization program which includes automated exposure control, adjustment of the mA and/or kV according to patient size and/or use of iterative reconstruction technique. CONTRAST:  60mL OMNIPAQUE IOHEXOL 300 MG/ML  SOLN COMPARISON:  Prior CTs 10/27/2022 and 08/19/2022. FINDINGS: Lower chest: Clear lung bases. Small pericardial effusion has mildly enlarged. There is no significant pleural effusion. Unchanged appearance of the visualized right breast. Hepatobiliary: Interval improvement in previously demonstrated hepatic steatosis. No focal liver lesion or abnormal enhancement identified. Status post cholecystectomy without evidence of significant biliary dilatation. Pancreas: The ill-defined low-density mass involving the pancreatic body appears similar, measuring approximately 3.7 x 2.3 cm on image 15/11. Once again, this mass encases the celiac axis and portal vein confluence. Severe narrowing of the portal vein confluence appears similar. 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 There is stable mild atrophy within the pancreatic tail. No progressive tumor identified. Spleen: Normal in size without focal abnormality. Adrenals/Urinary Tract: Both adrenal glands appear normal. Small nonobstructing bilateral renal calculi. No evidence of ureteral calculus, hydronephrosis or renal mass. The bladder  appears unremarkable for its degree of distention. Stomach/Bowel: No enteric contrast administered. The stomach appears unremarkable for its degree of distension. No evidence of bowel wall thickening, distention or surrounding  inflammatory change. There is a large amount of stool throughout the colon. Vascular/Lymphatic: There are no enlarged abdominal or pelvic lymph nodes. No acute vascular findings. Aortic and branch vessel atherosclerosis with chronic encasement of the celiac axis by the pancreatic mass. 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 Reproductive: Multiple uterine fibroids are again noted without significant change. No suspicious adnexal findings. Other: Postsurgical changes in the anterior abdominal wall. Scattered venous collaterals. No ascites or peritoneal nodularity identified. Musculoskeletal: No acute or significant osseous findings. IMPRESSION: 1. Stable appearance of the pancreatic body mass with vascular encasement and chronic occlusion of the splenic vein. 2. No evidence of metastatic disease. 3. Interval improvement in previously demonstrated hepatic steatosis. 4. Nonobstructing bilateral renal calculi. 5. Stable uterine fibroids. 6.  Aortic Atherosclerosis (ICD10-I70.0). Electronically Signed   By: Carey Bullocks M.D.   On: 02/28/2023 16:31    Medications: I have reviewed the patient's current medications.   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 CT abdomen/pelvis 07/30/2022-body/neck pancreas mass with peripheral pancreatic atrophy and duct dilation, suspected encasement of the celiac axis, uterine fibroids MRI abdomen 07/30/2022-3.7 x 4.7 cm pancreas body/tail mass with encasement of the proximal celiac artery, occlusion with collateralization at the confluence of the SMV and portal vein, no evidence of metastatic disease CT chest 08/01/2022-no intrathoracic metastases Elevated CA 19-9 EUS 08/02/2022-gastric polyp, gastric erosions, 32 x 33 mm pancreas body mass, invasion of the celiac trunk, invasion of the splenoportal confluence, no SMA invasion, 2 malignant appearing nodes in the porta hepatis,T4N1 by endoscopic criteria, FNA  biopsy-adenocarcinoma 08/17/2022 [employer]-tumor mutation burden 0.96; MSI high not detected; variant of uncertain clinical significance NTRK3 T93M Cycle 1 gemcitabine/Abraxane 08/17/2022 Cycle 2 gemcitabine/Abraxane 08/31/2022 Cycle 3 gemcitabine/Abraxane 09/14/2022 Cycle 4 gemcitabine/Abraxane 09/28/2022 Cycle 5 gemcitabine/Abraxane 10/12/2022 Cycle 6 gemcitabine/Abraxane 10/26/2022 CT abdomen/pelvis 10/27/2022-stable pancreas body mass with encasement of the celiac axis and portal vein with occlusion of the splenic vein and varices.  Stable porta hepatis lymph nodes. Cycle 7 gemcitabine/Abraxane 11/09/2022 Cycle 8 gemcitabine/Abraxane 11/23/2022 Cycle 9 gemcitabine/Abraxane 12/07/2022 Cycle 10 gemcitabine/Abraxane 12/21/2022 Cycle 11 gemcitabine/Abraxane 01/04/2023 Cycle 12 gemcitabine/Abraxane 01/18/2023 Cycle 13 gemcitabine/Abraxane 02/01/2023 Cycle 14 gemcitabine/Abraxane 02/15/2023 CT abdomen/pelvis 02/26/2023-stable appearance of pancreas body mass with vascular encasement and chronic occlusion of the splenic vein, no evidence of metastatic disease Cycle 15 gemcitabine/Abraxane 03/01/2023   2.  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  Anorexia/weight loss secondary to #1 4.  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  Abdominal pain 6.  History of colon polyps 7.  Family history of multiple cancers Pancreas cancer maternal aunt, maternal cousin, maternal grandfather Breast cancer-maternal aunt, maternal cousin Prostate cancer-maternal uncle Lung cancer-father 8.  Hypertension 9.  Postmenopausal vaginal bleeding 10.  H. pylori 08/02/2022-amoxicillin and Flagyl discontinued 08/21/2022 due to GI symptoms       Disposition: Kathleen Stone appears stable.  She has a history of locally advanced pancreas cancer.  She has been maintained on treatment with gemcitabine/Abraxane since December 2023.  Pain has resolved and the CA  19-9 now in normal range.  I reviewed the restaging CT findings and images with her.  There is no evidence of disease progression.  She will continue gemcitabine/Abraxane.  She is tolerating the treatment well.  I  will send her case at the GI tumor conference to discuss the indication for radiation.  She will complete another treat with gemcitabine/Abraxane today.  She will return for an office visit in 2 weeks.  Thornton Papas, MD  03/01/2023  10:07 AM

## 2023-03-01 NOTE — Patient Instructions (Signed)
[facility]   Discharge Instructions: Thank you for choosing [facility] to provide your oncology and hematology care.   If you have a lab appointment with the Cancer Center, please go directly to the Cancer Center and check in at the registration area.   Wear comfortable clothing and clothing appropriate for easy access to any Portacath or PICC line.   We strive to give you quality time with your provider. You may need to reschedule your appointment if you arrive late (15 or more minutes).  Arriving late affects you and other patients whose appointments are after yours.  Also, if you miss three or more appointments without notifying the office, you may be dismissed from the clinic at the provider's discretion.      For prescription refill requests, have your pharmacy contact our office and allow 72 hours for refills to be completed.    Today you received the following chemotherapy and/or immunotherapy agents Paclitaxel-protein bound (ABRAXANE) & Gemcitabine (GEMZAR).      To help prevent nausea and vomiting after your treatment, we encourage you to take your nausea medication as directed.  BELOW ARE SYMPTOMS THAT SHOULD BE REPORTED IMMEDIATELY: *FEVER GREATER THAN 100.4 F (38 C) OR HIGHER *CHILLS OR SWEATING *NAUSEA AND VOMITING THAT IS NOT CONTROLLED WITH YOUR NAUSEA MEDICATION *UNUSUAL SHORTNESS OF BREATH *UNUSUAL BRUISING OR BLEEDING *URINARY PROBLEMS (pain or burning when urinating, or frequent urination) *BOWEL PROBLEMS (unusual diarrhea, constipation, pain near the anus) TENDERNESS IN MOUTH AND THROAT WITH OR WITHOUT PRESENCE OF ULCERS (sore throat, sores in mouth, or a toothache) UNUSUAL RASH, SWELLING OR PAIN  UNUSUAL VAGINAL DISCHARGE OR ITCHING   Items with * indicate a potential emergency and should be followed up as soon as possible or go to the Emergency Department if any problems should occur.  Please show the  CHEMOTHERAPY ALERT CARD or IMMUNOTHERAPY ALERT CARD at check-in to the Emergency Department and triage nurse.  Should you have questions after your visit or need to cancel or reschedule your appointment, please contact [facility]  Dept: [phone]  and follow the prompts.  Office hours are 8:00 a.m. to 4:30 p.m. Monday - Friday. Please note that voicemails left after 4:00 p.m. may not be returned until the following business day.  We are closed weekends and major holidays. You have access to a nurse at all times for urgent questions. Please call the main number to the clinic Dept: [phone] and follow the prompts.   For any non-urgent questions, you may also contact your provider using MyChart. We now offer e-Visits for anyone 18 and older to request care online for non-urgent symptoms. For details visit [email or web address].   Also download the MyChart app! Go to the app store, search "MyChart", open the app, select [facility], and log in with your MyChart username and password.  Paclitaxel Nanoparticle Albumin-Bound Injection What is this medication? NANOPARTICLE ALBUMIN-BOUND PACLITAXEL (Na no PAHR ti kuhl al BYOO muhn-bound PAK li TAX el) treats some types of cancer. It works by slowing down the growth of cancer cells. This medicine may be used for other purposes; ask your health care provider or pharmacist if you have questions. COMMON BRAND NAME(S): Abraxane What should I tell my care team before I take this medication? They need to know if you have any of these conditions: Liver disease Low white blood cell levels An unusual or allergic reaction to paclitaxel, albumin, other medications, foods,   dyes, or preservatives If you or your partner are pregnant or trying to get pregnant Breast-feeding How should I use this medication? This medication is injected into a vein. It is given by your care team in a hospital or clinic setting. Talk to your  care team about the use of this medication in children. Special care may be needed. Overdosage: If you think you have taken too much of this medicine contact a poison control center or emergency room at once. NOTE: This medicine is only for you. Do not share this medicine with others. What if I miss a dose? Keep appointments for follow-up doses. It is important not to miss your dose. Call your care team if you are unable to keep an appointment. What may interact with this medication? Other medications may affect the way this medication works. Talk with your care team about all of the medications you take. They may suggest changes to your treatment plan to lower the risk of side effects and to make sure your medications work as intended. This list may not describe all possible interactions. Give your health care provider a list of all the medicines, herbs, non-prescription drugs, or dietary supplements you use. Also tell them if you smoke, drink alcohol, or use illegal drugs. Some items may interact with your medicine. What should I watch for while using this medication? Your condition will be monitored carefully while you are receiving this medication. You may need blood work while taking this medication. This medication may make you feel generally unwell. This is not uncommon as chemotherapy can affect healthy cells as well as cancer cells. Report any side effects. Continue your course of treatment even though you feel ill unless your care team tells you to stop. This medication can cause serious allergic reactions. To reduce the risk, your care team may give you other medications to take before receiving this one. Be sure to follow the directions from your care team. This medication may increase your risk of getting an infection. Call your care team for advice if you get a fever, chills, sore throat, or other symptoms of a cold or flu. Do not treat yourself. Try to avoid being around people who are  sick. This medication may increase your risk to bruise or bleed. Call your care team if you notice any unusual bleeding. Be careful brushing or flossing your teeth or using a toothpick because you may get an infection or bleed more easily. If you have any dental work done, tell your dentist you are receiving this medication. Talk to your care team if you or your partner may be pregnant. Serious birth defects can occur if you take this medication during pregnancy and for 6 months after the last dose. You will need a negative pregnancy test before starting this medication. Contraception is recommended while taking this medication and for 6 months after the last dose. Your care team can help you find the option that works for you. If your partner can get pregnant, use a condom during sex while taking this medication and for 3 months after the last dose. Do not breastfeed while taking this medication and for 2 weeks after the last dose. This medication may cause infertility. Talk to your care team if you are concerned about your fertility. What side effects may I notice from receiving this medication? Side effects that you should report to your care team as soon as possible: Allergic reactions--skin rash, itching, hives, swelling of the face, lips, tongue, or   throat Dry cough, shortness of breath or trouble breathing Infection--fever, chills, cough, sore throat, wounds that don't heal, pain or trouble when passing urine, general feeling of discomfort or being unwell Low red blood cell level--unusual weakness or fatigue, dizziness, headache, trouble breathing Pain, tingling, or numbness in the hands or feet Stomach pain, unusual weakness or fatigue, nausea, vomiting, diarrhea, or fever that lasts longer than expected Unusual bruising or bleeding Side effects that usually do not require medical attention (report to your care team if they continue or are bothersome): Diarrhea Fatigue Hair loss Loss of  appetite Nausea Vomiting This list may not describe all possible side effects. Call your doctor for medical advice about side effects. You may report side effects to FDA at 1-800-FDA-1088. Where should I keep my medication? This medication is given in a hospital or clinic. It will not be stored at home. NOTE: This sheet is a summary. It may not cover all possible information. If you have questions about this medicine, talk to your doctor, pharmacist, or health care provider.  2023 Elsevier/Gold Standard (2007-10-25 00:00:00)  Gemcitabine Injection What is this medication? GEMCITABINE (jem SYE ta been) treats some types of cancer. It works by slowing down the growth of cancer cells. This medicine may be used for other purposes; ask your health care provider or pharmacist if you have questions. COMMON BRAND NAME(S): Gemzar, Infugem What should I tell my care team before I take this medication? They need to know if you have any of these conditions: Blood disorders Infection Kidney disease Liver disease Lung or breathing disease, such as asthma or COPD Recent or ongoing radiation therapy An unusual or allergic reaction to gemcitabine, other medications, foods, dyes, or preservatives If you or your partner are pregnant or trying to get pregnant Breast-feeding How should I use this medication? This medication is injected into a vein. It is given by your care team in a hospital or clinic setting. Talk to your care team about the use of this medication in children. Special care may be needed. Overdosage: If you think you have taken too much of this medicine contact a poison control center or emergency room at once. NOTE: This medicine is only for you. Do not share this medicine with others. What if I miss a dose? Keep appointments for follow-up doses. It is important not to miss your dose. Call your care team if you are unable to keep an appointment. What may interact with this  medication? Interactions have not been studied. This list may not describe all possible interactions. Give your health care provider a list of all the medicines, herbs, non-prescription drugs, or dietary supplements you use. Also tell them if you smoke, drink alcohol, or use illegal drugs. Some items may interact with your medicine. What should I watch for while using this medication? Your condition will be monitored carefully while you are receiving this medication. This medication may make you feel generally unwell. This is not uncommon, as chemotherapy can affect healthy cells as well as cancer cells. Report any side effects. Continue your course of treatment even though you feel ill unless your care team tells you to stop. In some cases, you may be given additional medications to help with side effects. Follow all directions for their use. This medication may increase your risk of getting an infection. Call your care team for advice if you get a fever, chills, sore throat, or other symptoms of a cold or flu. Do not treat yourself. Try to   avoid being around people who are sick. This medication may increase your risk to bruise or bleed. Call your care team if you notice any unusual bleeding. Be careful brushing or flossing your teeth or using a toothpick because you may get an infection or bleed more easily. If you have any dental work done, tell your dentist you are receiving this medication. Avoid taking medications that contain aspirin, acetaminophen, ibuprofen, naproxen, or ketoprofen unless instructed by your care team. These medications may hide a fever. Talk to your care team if you or your partner wish to become pregnant or think you might be pregnant. This medication can cause serious birth defects if taken during pregnancy and for 6 months after the last dose. A negative pregnancy test is required before starting this medication. A reliable form of contraception is recommended while taking  this medication and for 6 months after the last dose. Talk to your care team about effective forms of contraception. Do not father a child while taking this medication and for 3 months after the last dose. Use a condom while having sex during this time period. Do not breastfeed while taking this medication and for at least 1 week after the last dose. This medication may cause infertility. Talk to your care team if you are concerned about your fertility. What side effects may I notice from receiving this medication? Side effects that you should report to your care team as soon as possible: Allergic reactions--skin rash, itching, hives, swelling of the face, lips, tongue, or throat Capillary leak syndrome--stomach or muscle pain, unusual weakness or fatigue, feeling faint or lightheaded, decrease in the amount of urine, swelling of the ankles, hands, or feet, trouble breathing Infection--fever, chills, cough, sore throat, wounds that don't heal, pain or trouble when passing urine, general feeling of discomfort or being unwell Liver injury--right upper belly pain, loss of appetite, nausea, light-colored stool, dark yellow or brown urine, yellowing skin or eyes, unusual weakness or fatigue Low red blood cell level--unusual weakness or fatigue, dizziness, headache, trouble breathing Lung injury--shortness of breath or trouble breathing, cough, spitting up blood, chest pain, fever Stomach pain, bloody diarrhea, pale skin, unusual weakness or fatigue, decrease in the amount of urine, which may be signs of hemolytic uremic syndrome Sudden and severe headache, confusion, change in vision, seizures, which may be signs of posterior reversible encephalopathy syndrome (PRES) Unusual bruising or bleeding Side effects that usually do not require medical attention (report to your care team if they continue or are bothersome): Diarrhea Drowsiness Hair loss Nausea Pain, redness, or swelling with sores inside the  mouth or throat Vomiting This list may not describe all possible side effects. Call your doctor for medical advice about side effects. You may report side effects to FDA at 1-800-FDA-1088. Where should I keep my medication? This medication is given in a hospital or clinic. It will not be stored at home. NOTE: This sheet is a summary. It may not cover all possible information. If you have questions about this medicine, talk to your doctor, pharmacist, or health care provider.  2023 Elsevier/Gold Standard (2022-01-09 00:00:00)  

## 2023-03-01 NOTE — Progress Notes (Signed)
Patient seen by Dr. Sherrill today ? ?Vitals are within treatment parameters. ? ?Labs reviewed by Dr. Sherrill and are within treatment parameters. ? ?Per physician team, patient is ready for treatment and there are NO modifications to the treatment plan.  ?

## 2023-03-02 LAB — CANCER ANTIGEN 19-9: CA 19-9: 16 U/mL (ref 0–35)

## 2023-03-04 ENCOUNTER — Telehealth: Payer: Self-pay | Admitting: Oncology

## 2023-03-04 NOTE — Telephone Encounter (Signed)
Spoke with patient confirming upcoming appointment  

## 2023-03-07 ENCOUNTER — Emergency Department (HOSPITAL_COMMUNITY)
Admission: EM | Admit: 2023-03-07 | Discharge: 2023-03-07 | Disposition: A | Discharge status: Home / Self Care | Payer: Commercial Managed Care - HMO | Attending: Emergency Medicine | Admitting: Emergency Medicine

## 2023-03-07 ENCOUNTER — Other Ambulatory Visit: Payer: Self-pay

## 2023-03-07 ENCOUNTER — Encounter (HOSPITAL_COMMUNITY): Payer: Self-pay

## 2023-03-07 ENCOUNTER — Emergency Department (HOSPITAL_COMMUNITY): Payer: Commercial Managed Care - HMO

## 2023-03-07 DIAGNOSIS — S0003XA Contusion of scalp, initial encounter: Secondary | ICD-10-CM | POA: Insufficient documentation

## 2023-03-07 DIAGNOSIS — R55 Syncope and collapse: Secondary | ICD-10-CM

## 2023-03-07 DIAGNOSIS — S0990XA Unspecified injury of head, initial encounter: Secondary | ICD-10-CM

## 2023-03-07 DIAGNOSIS — W01198A Fall on same level from slipping, tripping and stumbling with subsequent striking against other object, initial encounter: Secondary | ICD-10-CM | POA: Insufficient documentation

## 2023-03-07 LAB — CBC WITH DIFFERENTIAL/PLATELET
Abs Immature Granulocytes: 0.03 10*3/uL (ref 0.00–0.07)
Basophils Absolute: 0 10*3/uL (ref 0.0–0.1)
Basophils Relative: 1 %
Eosinophils Absolute: 0.1 10*3/uL (ref 0.0–0.5)
Eosinophils Relative: 2 %
HCT: 33.5 % — ABNORMAL LOW (ref 36.0–46.0)
Hemoglobin: 11.2 g/dL — ABNORMAL LOW (ref 12.0–15.0)
Immature Granulocytes: 1 %
Lymphocytes Relative: 43 %
Lymphs Abs: 1.6 10*3/uL (ref 0.7–4.0)
MCH: 33.6 pg (ref 26.0–34.0)
MCHC: 33.4 g/dL (ref 30.0–36.0)
MCV: 100.6 fL — ABNORMAL HIGH (ref 80.0–100.0)
Monocytes Absolute: 0.2 10*3/uL (ref 0.1–1.0)
Monocytes Relative: 5 %
Neutro Abs: 1.8 10*3/uL (ref 1.7–7.7)
Neutrophils Relative %: 48 %
Platelets: 166 10*3/uL (ref 150–400)
RBC: 3.33 MIL/uL — ABNORMAL LOW (ref 3.87–5.11)
RDW: 17.4 % — ABNORMAL HIGH (ref 11.5–15.5)
WBC: 3.7 10*3/uL — ABNORMAL LOW (ref 4.0–10.5)
nRBC: 0 % (ref 0.0–0.2)

## 2023-03-07 LAB — COMPREHENSIVE METABOLIC PANEL
ALT: 22 U/L (ref 0–44)
AST: 28 U/L (ref 15–41)
Albumin: 3 g/dL — ABNORMAL LOW (ref 3.5–5.0)
Alkaline Phosphatase: 60 U/L (ref 38–126)
Anion gap: 8 (ref 5–15)
BUN: 16 mg/dL (ref 6–20)
CO2: 26 mmol/L (ref 22–32)
Calcium: 9 mg/dL (ref 8.9–10.3)
Chloride: 104 mmol/L (ref 98–111)
Creatinine, Ser: 0.88 mg/dL (ref 0.44–1.00)
GFR, Estimated: >60 mL/min (ref >60)
Glucose, Bld: 124 mg/dL — ABNORMAL HIGH (ref 70–99)
Potassium: 3.9 mmol/L (ref 3.5–5.1)
Sodium: 138 mmol/L (ref 135–145)
Total Bilirubin: 1 mg/dL (ref 0.3–1.2)
Total Protein: 6 g/dL — ABNORMAL LOW (ref 6.5–8.1)

## 2023-03-07 LAB — TROPONIN I (HIGH SENSITIVITY)
Troponin I (High Sensitivity): 9 ng/L (ref <18)
Troponin I (High Sensitivity): 10 ng/L (ref <18)

## 2023-03-07 MED ORDER — SODIUM CHLORIDE 0.9 % IV BOLUS
1000.0000 mL | Freq: Once | INTRAVENOUS | Status: AC
  Administered 2023-03-07: 1000 mL via INTRAVENOUS

## 2023-03-07 MED ORDER — KETOROLAC TROMETHAMINE 15 MG/ML IJ SOLN
15.0000 mg | Freq: Once | INTRAMUSCULAR | Status: AC
  Administered 2023-03-07: 15 mg via INTRAVENOUS
  Filled 2023-03-07: qty 1

## 2023-03-07 MED ORDER — LIDOCAINE 5 % EX PTCH
1.0000 | MEDICATED_PATCH | Freq: Once | CUTANEOUS | Status: DC
  Administered 2023-03-07: 1 via TRANSDERMAL
  Filled 2023-03-07: qty 1

## 2023-03-07 MED ORDER — ONDANSETRON HCL 4 MG/2ML IJ SOLN
4.0000 mg | Freq: Once | INTRAMUSCULAR | Status: AC
  Administered 2023-03-07: 4 mg via INTRAVENOUS
  Filled 2023-03-07: qty 2

## 2023-03-07 MED ORDER — ACETAMINOPHEN 325 MG PO TABS
650.0000 mg | ORAL_TABLET | Freq: Once | ORAL | Status: AC
  Administered 2023-03-07: 650 mg via ORAL
  Filled 2023-03-07: qty 2

## 2023-03-07 MED ORDER — LIDOCAINE 5 % EX PTCH: 1.0000 | MEDICATED_PATCH | CUTANEOUS | 0 refills | Status: DC

## 2023-03-07 NOTE — Discharge Instructions (Signed)
You were seen in the emergency department after your syncopal event.  He had no signs of broken bones or injuries to your brain though you do have a hematoma which is a swelling on the back of your scalp.  You may have a mild concussion as well.  You can continue to ice the back of your head and take Tylenol and Motrin as needed for pain as well as use lidocaine patches.  You were in a normal heart rhythm here with no signs of abnormal electrolytes and it is unclear what exactly made you pass out today.  You can follow-up with your primary doctor to have your symptoms rechecked.  You should return to the emergency department if you are having significantly worsening headaches, repetitive vomiting, numbness or weakness on one side of the body compared to the other, recurrent episodes of passing out or any other new or concerning symptoms.

## 2023-03-07 NOTE — ED Triage Notes (Signed)
Pt bib ems from [facility], pt had a witnessed syncopal episode, fell to the floor, hit her head on the counter then floor. Hematoma noted to left posterior head, c collar in place, pt vomited once en route, nauseated upon arrival to ED. Pt a.o, pupils equal and reactive. Pt has active cancer treatment

## 2023-03-07 NOTE — ED Provider Notes (Signed)
[place] EMERGENCY DEPARTMENT AT [facility] Provider Note   CSN: [id number] Arrival date & time: 03/07/23  0940     History  Chief Complaint  Patient presents with   Loss of Consciousness    Kathleen Stone is a 57 y.o. female.  Patient is a 57 year old female with a past medical history of metastatic pancreatic cancer on chemotherapy presenting to the emergency department after a syncopal episode.  Patient was at the [facility] shortly prior to arrival and she states that she remembers feeling very hot and next thing she knows she woke up on the ground.  Per EMS, she did hit her head.  She states that she does have a posterior headache.  She states that she is nauseous and vomited once.  She denies any numbness or weakness.  She denies any chest pain or shortness of breath prior to the fall.  She states that she has been feeling well for last couple of days with no recent dizziness, nausea or vomiting but states that she has had some diarrhea.  She denies any black or bloody stools.  The history is provided by the patient.  Loss of Consciousness      Home Medications Prior to Admission medications   Medication Sig Start Date End Date Taking? Authorizing Provider  albuterol (VENTOLIN HFA) 108 (90 Base) MCG/ACT inhaler Inhale 2 puffs into the lungs every 6 (six) hours as needed for wheezing or shortness of breath. 02/15/23   Ladene Artist, MD  lidocaine-prilocaine (EMLA) cream Apply 1 Application topically as needed. 08/08/22   Ladene Artist, MD  metoprolol tartrate (LOPRESSOR) 25 MG tablet Take 1 tablet (25 mg total) by mouth 2 (two) times daily. Take 1 tablet 2 hours before your CT scan Patient not taking: Reported on 12/07/2022 05/10/22   Crissie Sickles, MD  mirtazapine (REMERON) 15 MG tablet Take 1 tablet (15 mg total) by mouth at bedtime. Patient not taking: Reported on 12/07/2022 10/26/22   Rana Snare, NP  nitroGLYCERIN (NITROSTAT) 0.4 MG SL tablet Place 1 tablet  (0.4 mg total) under the tongue every 5 (five) minutes as needed for chest pain. Patient not taking: Reported on 07/31/2022 11/01/21 07/31/22  Atway, Rayann N, DO  ondansetron (ZOFRAN) 4 MG tablet Take 1 tablet (4 mg total) by mouth every 8 (eight) hours as needed for nausea or vomiting. Patient not taking: Reported on 11/09/2022 06/19/22 06/19/23  Adron Bene, MD  oxyCODONE (OXY IR/ROXICODONE) 5 MG immediate release tablet Take 1 tablet (5 mg total) by mouth every 6 (six) hours as needed for severe pain. Patient not taking: Reported on 11/23/2022 08/20/22   Rana Snare, NP  Pancrelipase, Lip-Prot-Amyl, [facility]) [phone] units CPEP Take 25,000 Units by mouth in the morning, at noon, and at bedtime. Patient not taking: Reported on 09/28/2022 06/19/22   Adron Bene, MD  pantoprazole (PROTONIX) 40 MG tablet Take 1 tablet (40 mg total) by mouth daily. Patient not taking: Reported on 11/09/2022 09/14/22 11/13/22  Rana Snare, NP  potassium chloride (KLOR-CON M) 10 MEQ tablet Take 2 tablets (20 mEq total) by mouth 2 (two) times daily. Take #2 tablets pm of 12/01, then #2 tablets twice daily Patient not taking: Reported on 11/09/2022 08/17/22   Ladene Artist, MD  pregabalin (LYRICA) 25 MG capsule Take 25 mg by mouth 2 (two) times daily.    [provider]  prochlorperazine (COMPAZINE) 10 MG tablet Take 1 tablet (10 mg total) by mouth every 6 (  six) hours as needed for nausea or vomiting. Patient not taking: Reported on 11/09/2022 10/26/22   Rana Snare, NP      Allergies    Iohexol    Review of Systems   Review of Systems  Cardiovascular:  Positive for syncope.    Physical Exam Updated Vital Signs BP (!) 141/67   Pulse 81   Temp 97.7 F (36.5 C) (Oral)   Resp 20   LMP 02/07/2016 (Exact Date)   SpO2 100%  Physical Exam Vitals and nursing note reviewed.  Constitutional:      General: She is not in acute distress.    Appearance: Normal appearance.  HENT:     Head:  Normocephalic.     Comments: Posterior scalp hematoma    Nose: Nose normal.     Mouth/Throat:     Mouth: Mucous membranes are moist.     Pharynx: Oropharynx is clear.  Eyes:     Extraocular Movements: Extraocular movements intact.     Conjunctiva/sclera: Conjunctivae normal.  Neck:     Comments: No midline neck tenderness, c-collar in place Cardiovascular:     Rate and Rhythm: Normal rate and regular rhythm.     Heart sounds: Normal heart sounds.  Pulmonary:     Effort: Pulmonary effort is normal.     Breath sounds: Normal breath sounds.  Abdominal:     General: Abdomen is flat.     Palpations: Abdomen is soft.     Tenderness: There is no abdominal tenderness.  Musculoskeletal:        General: Normal range of motion.     Comments: No midline back tenderness No bony tenderness to bilateral upper or lower extremities Pelvis stable, nontender  Skin:    General: Skin is warm and dry.  Neurological:     General: No focal deficit present.     Mental Status: She is alert and oriented to person, place, and time.     Cranial Nerves: No cranial nerve deficit.     Motor: No weakness.  Psychiatric:        Mood and Affect: Mood normal.        Behavior: Behavior normal.     ED Results / Procedures / Treatments   Labs (all labs ordered are listed, but only abnormal results are displayed) Labs Reviewed  COMPREHENSIVE METABOLIC PANEL - Abnormal; Notable for the following components:      Result Value   Glucose, Bld 124 (*)    Total Protein 6.0 (*)    Albumin 3.0 (*)    All other components within normal limits  CBC WITH DIFFERENTIAL/PLATELET - Abnormal; Notable for the following components:   WBC 3.7 (*)    RBC 3.33 (*)    Hemoglobin 11.2 (*)    HCT 33.5 (*)    MCV 100.6 (*)    RDW 17.4 (*)    All other components within normal limits  TROPONIN I (HIGH SENSITIVITY)  TROPONIN I (HIGH SENSITIVITY)    EKG None  Radiology CT Cervical Spine Wo Contrast  Result Date:  03/07/2023 CLINICAL DATA:  Syncope, fall EXAM: CT CERVICAL SPINE WITHOUT CONTRAST TECHNIQUE: Multidetector CT imaging of the cervical spine was performed without intravenous contrast. Multiplanar CT image reconstructions were also generated. RADIATION DOSE REDUCTION: This exam was performed according to the departmental dose-optimization program which includes automated exposure control, adjustment of the mA and/or kV according to patient size and/or use of iterative reconstruction technique. COMPARISON:  None Available. FINDINGS: Alignment: Alignment  of posterior margins of vertebral bodies is within normal limits. Skull base and vertebrae: No recent fracture is seen. Degenerative changes are noted in cervical spine, more so at C5-C6 level. Soft tissues and spinal canal: Posterior bony spurs are causing extrinsic pressure over the ventral margin of thecal sac at C5-C6 level with mild spinal stenosis. Disc levels: There is encroachment of neural foramina by bony spurs and facet hypertrophy from C2 to C6 levels. Upper chest: Upper lung fields are unremarkable. Other: Thyroid is enlarged with small nodules. Right IJ central venous catheter is noted. IMPRESSION: No recent fracture is seen in cervical spine. Cervical spondylosis. There is mild spinal stenosis at C5-C6 level. There is encroachment of neural foramina from C2-C6 levels. Electronically Signed   By: Ernie Avena M.D.   On: 03/07/2023 12:08   CT Head Wo Contrast  Result Date: 03/07/2023 CLINICAL DATA:  Syncope, fall, vomiting EXAM: CT HEAD WITHOUT CONTRAST TECHNIQUE: Contiguous axial images were obtained from the base of the skull through the vertex without intravenous contrast. RADIATION DOSE REDUCTION: This exam was performed according to the departmental dose-optimization program which includes automated exposure control, adjustment of the mA and/or kV according to patient size and/or use of iterative reconstruction technique. COMPARISON:   05/21/2022 FINDINGS: Brain: No acute intracranial findings are seen. There are no signs of bleeding within the cranium. There is small area of encephalomalacia in the right frontal lobe. There is evidence of right frontal craniotomy. There is ill-defined 11 mm area of subtle increased density in right side of midbrain. There is 2.1 x 1.8 cm area of subtle decreased density in the inferior aspect of the midbrain extending into the pons which appears more prominent. Cortical sulci are prominent. Vascular: Unremarkable. Skull: No fracture is seen in calvarium. There is subcutaneous contusion/hematoma in the left parietal scalp. Sinuses/Orbits: There is mild mucosal thickening in ethmoid and maxillary sinuses. Other: None. IMPRESSION: There are no signs of bleeding within the cranium. There is encephalomalacia in the right frontal lobe with no significant change, possibly residual from previous surgery or old infarct. There is an inhomogeneous lesion in the right side of pons measuring up to 2.1 cm in diameter with possible increase in size. Possibility of neoplastic process should be considered. Follow-up MRI may be considered. There is subcutaneous contusion/hematoma in the left parietal scalp. No fracture is seen in calvarium. Electronically Signed   By: Ernie Avena M.D.   On: 03/07/2023 12:04   DG Chest 2 View  Result Date: 03/07/2023 CLINICAL DATA:  Syncope EXAM: CHEST - 2 VIEW COMPARISON:  03/12/2022 FINDINGS: No consolidation, pneumothorax or effusion. No edema. Normal cardiopericardial silhouette. Right IJ chest port with the tip along the central SVC above the right atrium. Overlapping cardiac leads. Degenerative changes of the spine. IMPRESSION: No acute cardiopulmonary disease.  Chest port Electronically Signed   By: Karen Kays M.D.   On: 03/07/2023 10:50    Procedures Procedures    Medications Ordered in ED Medications  lidocaine (LIDODERM) 5 % 1-3 patch (has no administration in time  range)  ondansetron (ZOFRAN) injection 4 mg (4 mg Intravenous Given 03/07/23 1021)  sodium chloride 0.9 % bolus 1,000 mL (0 mLs Intravenous Stopped 03/07/23 1220)  acetaminophen (TYLENOL) tablet 650 mg (650 mg Oral Given 03/07/23 1025)  ketorolac (TORADOL) 15 MG/ML injection 15 mg (15 mg Intravenous Given 03/07/23 1352)    ED Course/ Medical Decision Making/ A&P Clinical Course as of 03/07/23 1413  Thu Mar 07, 2023  1309  No acute traumatic injury on CT. Per patient has known brain met. [VK]  1410 Patient reports that she is still having a headache and received Toradol and will be given a lidocaine patch.  Patient's labs are within normal range including negative troponin x 2.  She is stable for discharge home with outpatient follow-up and was given strict return precautions. [VK]    Clinical Course User Index [VK] Rexford Maus, DO                             Medical Decision Making This patient presents to the ED with chief complaint(s) of syncope with pertinent past medical history of pancreatic cancer which further complicates the presenting complaint. The complaint involves an extensive differential diagnosis and also carries with it a high risk of complications and morbidity.    The differential diagnosis includes arrhythmia, anemia, dehydration, electrolyte abnormality, ICH, mass effect, cervical spine fracture, no other traumatic injuries seen on exam  Additional history obtained: Additional history obtained from N/A Records reviewed outpatient oncology records  ED Course and Reassessment: On patient's arrival to the emergency department she was hemodynamically stable in no acute distress.  EKG on arrival showed normal sinus rhythm but did have new T wave inversions in V4 through V6.  Patient denied any chest pain prior to the syncopal event.  Patient will have labs including troponin to evaluate for possible cause of his of her syncope and will have a CT head and C-spine to  evaluate for traumatic injury.  She was given Tylenol, Zofran and ice pack for symptomatic management and will be closely reassessed.  Independent labs interpretation:  The following labs were independently interpreted: within normal range  Independent visualization of imaging: - I independently visualized the following imaging with scope of interpretation limited to determining acute life threatening conditions related to emergency care: CTH/C-spine, CXR, which revealed no acute traumatic injury  Consultation: - Consulted or discussed management/test interpretation w/ external professional: N/A  Consideration for admission or further workup: Patient has no emergent conditions requiring admission or further work-up at this time and is stable for discharge home with primary care follow-up  Social Determinants of health: N/A    Amount and/or Complexity of Data Reviewed Labs: ordered. Radiology: ordered.  Risk OTC drugs. Prescription drug management.          Final Clinical Impression(s) / ED Diagnoses Final diagnoses:  Syncope, unspecified syncope type  Hematoma of scalp, initial encounter  Injury of head, initial encounter    Rx / DC Orders ED Discharge Orders     None         Rexford Maus, DO 03/07/23 1413

## 2023-03-07 NOTE — ED Notes (Signed)
Patient transported to CT 

## 2023-03-07 NOTE — ED Notes (Signed)
Patient transported to X-ray 

## 2023-03-12 ENCOUNTER — Ambulatory Visit (INDEPENDENT_AMBULATORY_CARE_PROVIDER_SITE_OTHER): Payer: Commercial Managed Care - HMO | Admitting: Student

## 2023-03-12 VITALS — BP 138/71 | HR 84 | Temp 97.5°F | Ht 66.0 in | Wt 152.5 lb

## 2023-03-12 DIAGNOSIS — R55 Syncope and collapse: Secondary | ICD-10-CM | POA: Diagnosis not present

## 2023-03-12 DIAGNOSIS — Z0000 Encounter for general adult medical examination without abnormal findings: Secondary | ICD-10-CM

## 2023-03-12 NOTE — Patient Instructions (Signed)
Thank you so much for coming to the clinic today!   I think that episode could have been due to dehydration, it's important that you stay up with your fluid needs, ideally we should be drinking about 3-4L of water per day. I'm glad that you're doing well with no recurrent episodes. Your lab work and imaging is also reassuring.   If you have any questions please feel free to the call the clinic at anytime at [phone]. It was a pleasure seeing you!  Best, Dr. Thomasene Ripple

## 2023-03-13 ENCOUNTER — Other Ambulatory Visit: Payer: Self-pay

## 2023-03-13 DIAGNOSIS — R55 Syncope and collapse: Secondary | ICD-10-CM

## 2023-03-13 DIAGNOSIS — Z0000 Encounter for general adult medical examination without abnormal findings: Secondary | ICD-10-CM | POA: Insufficient documentation

## 2023-03-13 HISTORY — DX: Syncope and collapse: R55

## 2023-03-13 NOTE — Progress Notes (Signed)
CC: ED Follow up  HPI:  [name] is a 57 y.o. female living with a history stated below and presents today for ED follow-up. Please see problem based assessment and plan for additional details.  Past Medical History:  Diagnosis Date   Brain tumor (benign) (HCC)    Chronic lower back pain    Negative MRI of hips bilaterally, neg CT scan of A and P; MRI of T and L spine-> very mild deg changes from T4-5 and T7-8, cervical spondylotic changes at C5-6, very mild disc bulge at L2-3 without spinal stenosis, nl ESR, ANA, RF, HIV, TSH, and B12   DJD (degenerative joint disease) of cervical spine    Family history of adverse reaction to anesthesia    "sisters PONV"   Galactorrhea of both breasts 2009   R. sided w/ benign papilloma excised in 4/09. L sided in 5/09   Gallstones    Hyperlipidemia    Hypertension    Migraine    "monthly" (11/15/2016)   Neuromuscular disorder (HCC)    left arm and side of face    Neuropathy    Thought to be 2/2 nerve compression in the C-spine   Obesity    Personal history of adenomatous colonic polyp    09/2007 - diminutive adenoma ([facility])   PONV (postoperative nausea and vomiting)    Stroke (HCC)    2021- brain tumor bx caused ? stroke-    Tobacco abuse     Current Outpatient Medications on File Prior to Visit  Medication Sig Dispense Refill   albuterol (VENTOLIN HFA) 108 (90 Base) MCG/ACT inhaler Inhale 2 puffs into the lungs every 6 (six) hours as needed for wheezing or shortness of breath. 6.7 g 1   lidocaine (LIDODERM) 5 % Place 1 patch onto the skin daily. Remove & Discard patch within 12 hours or as directed by MD 30 patch 0   lidocaine-prilocaine (EMLA) cream Apply 1 Application topically as needed. 30 g 0   metoprolol tartrate (LOPRESSOR) 25 MG tablet Take 1 tablet (25 mg total) by mouth 2 (two) times daily. Take 1 tablet 2 hours before your CT scan (Patient not taking: Reported on 12/07/2022) 60 tablet 2   mirtazapine (REMERON) 15 MG  tablet Take 1 tablet (15 mg total) by mouth at bedtime. (Patient not taking: Reported on 12/07/2022) 30 tablet 1   nitroGLYCERIN (NITROSTAT) 0.4 MG SL tablet Place 1 tablet (0.4 mg total) under the tongue every 5 (five) minutes as needed for chest pain. (Patient not taking: Reported on 07/31/2022) 25 tablet 1   ondansetron (ZOFRAN) 4 MG tablet Take 1 tablet (4 mg total) by mouth every 8 (eight) hours as needed for nausea or vomiting. (Patient not taking: Reported on 11/09/2022) 30 tablet 1   oxyCODONE (OXY IR/ROXICODONE) 5 MG immediate release tablet Take 1 tablet (5 mg total) by mouth every 6 (six) hours as needed for severe pain. (Patient not taking: Reported on 11/23/2022) 30 tablet 0   Pancrelipase, Lip-Prot-Amyl, (PANCREAZE) 4200-14200 units CPEP Take 25,000 Units by mouth in the morning, at noon, and at bedtime. (Patient not taking: Reported on 09/28/2022) 900 capsule 0   pantoprazole (PROTONIX) 40 MG tablet Take 1 tablet (40 mg total) by mouth daily. (Patient not taking: Reported on 11/09/2022) 30 tablet 1   potassium chloride (KLOR-CON M) 10 MEQ tablet Take 2 tablets (20 mEq total) by mouth 2 (two) times daily. Take #2 tablets pm of 12/01, then #2 tablets twice daily (Patient not  taking: Reported on 11/09/2022) 120 tablet 1   pregabalin (LYRICA) 25 MG capsule Take 25 mg by mouth 2 (two) times daily.     prochlorperazine (COMPAZINE) 10 MG tablet Take 1 tablet (10 mg total) by mouth every 6 (six) hours as needed for nausea or vomiting. (Patient not taking: Reported on 11/09/2022) 30 tablet 1   No current facility-administered medications on file prior to visit.    Family History  Problem Relation Age of Onset   Heart failure Mother    Diabetes Mother    Lung cancer Father        dx 20s-80s   Breast cancer Maternal Aunt        d. 19   Pancreatic cancer Maternal Aunt        d. 104   Prostate cancer Maternal Uncle        d. 2   Pancreatic cancer Maternal Grandfather        d. 42s   Breast  cancer Cousin        dx 46s; maternal female cousin   Stomach cancer Cousin        d. 43; maternal female cousin   Pancreatic cancer Cousin        d. 47s; maternal female cousin   Colon cancer Neg Hx    Colon polyps Neg Hx    Esophageal cancer Neg Hx    Rectal cancer Neg Hx     Social History   Socioeconomic History   Marital status: Married    Spouse name: Not on file   Number of children: Not on file   Years of education: Not on file   Highest education level: Not on file  Occupational History   Occupation: unemployed - applied for disability  Tobacco Use   Smoking status: Every Day    Packs/day: 1.50    Years: 36.00    Additional pack years: 0.00    Total pack years: 54.00    Types: Cigarettes   Smokeless tobacco: Never  Vaping Use   Vaping Use: Former  Substance and Sexual Activity   Alcohol use: No    Alcohol/week: 0.0 standard drinks of alcohol   Drug use: No   Sexual activity: Yes    Partners: Female  Other Topics Concern   Not on file  Social History Narrative   Domestic Partner: female has emphysema   Smoked 2 packs/day for 20+ years, down to 1/2ppd now, willing to try to quit    Alcohol use-no   Drug use-no   Regular exercise-no   Social Determinants of Health   Financial Resource Strain: Medium Risk (08/21/2022)   Overall Financial Resource Strain (CARDIA)    Difficulty of Paying Living Expenses: Somewhat hard  Food Insecurity: No Food Insecurity (07/31/2022)   Hunger Vital Sign    Worried About Running Out of Food in the Last Year: Never true    Ran Out of Food in the Last Year: Never true  Transportation Needs: No Transportation Needs (07/31/2022)   PRAPARE - Administrator, Civil Service (Medical): No    Lack of Transportation (Non-Medical): No  Physical Activity: Not on file  Stress: Not on file  Social Connections: Not on file  Intimate Partner Violence: Not At Risk (07/31/2022)   Humiliation, Afraid, Rape, and Kick questionnaire     Fear of Current or Ex-Partner: No    Emotionally Abused: No    Physically Abused: No    Sexually Abused: No  Review of Systems: ROS negative except for what is noted on the assessment and plan.  Vitals:   03/12/23 1441  BP: 138/71  Pulse: 84  Temp: (!) 97.5 F (36.4 C)  TempSrc: Oral  SpO2: 100%  Weight: 152 lb 8 oz (69.2 kg)  Height: 5\' 6"  (1.676 m)    Physical Exam: Constitutional: well-appearing female  in no acute distress HENT: normocephalic atraumatic, mucous membranes moist Eyes: conjunctiva non-erythematous Cardiovascular: regular rate and rhythm, no m/r/g Pulmonary/Chest: normal work of breathing on room air, lungs clear to auscultation bilaterally MSK: normal bulk and tone Neurological: alert & oriented x 3, 5/5 strength in bilateral upper and lower extremities,   Assessment & Plan:   Healthcare maintenance Screening mammogram ordered today.  Syncope Patient presents as a follow-up from the emergency department for syncopal episode.  She states [place] the bank [place] she felt a rush of heat and collapsed.  She did lose consciousness for about 20 minutes.  Evaluation in the emergency department was unrevealing with CBC and BMP within normal limits.  CT head did not show any increasing lesion in her brain which was likely related to her metastatic pancreatic cancer.  She denies any seizures.  This episode has not returned.  She does state that she feels dehydrated a lot of the time, she drinks 1 to 2 glasses of water every few days.  I believe her syncopal episode was most likely due to dehydration next with the heat.   Plan: - Encourage patient to keep up with fluid intake  Patient discussed with Dr. Lanelle Bal Ziyah Cordoba, M.D. [facility] Internal Medicine, PGY-1 Pager: [phone] Date 03/13/2023 Time 7:45 AM

## 2023-03-13 NOTE — Assessment & Plan Note (Signed)
Screening mammogram ordered today ?

## 2023-03-13 NOTE — Progress Notes (Signed)
The proposed treatment discussed in conference is for discussion purpose only and is not a binding recommendation.  The patients have not been physically examined, or presented with their treatment options.  Therefore, final treatment plans cannot be decided.  

## 2023-03-13 NOTE — Assessment & Plan Note (Deleted)
Patient presents as a follow-up from the emergency department for syncopal episode.  She states that she was at the bank would she felt a rush of heat and collapsed.  She did lose consciousness for about 20 minutes.  Evaluation in the emergency department was unrevealing with CBC and BMP within normal limits.  CT head did not show any increasing lesion in her brain which was likely related to her metastatic pancreatic cancer.  She denies any seizures.  This episode has not returned.  She does state that she feels dehydrated a lot of the time, she drinks 1 to 2 glasses of water every few days.  I believe her syncopal episode was most likely due to dehydration next with the heat.  Plan: - Encourage patient to keep up with fluid intake

## 2023-03-13 NOTE — Assessment & Plan Note (Signed)
Patient presents as a follow-up from the emergency department for syncopal episode.  She states that she was at the bank would she felt a rush of heat and collapsed.  She did lose consciousness for about 20 minutes.  Evaluation in the emergency department was unrevealing with CBC and BMP within normal limits.  CT head did not show any increasing lesion in her brain which was likely related to her metastatic pancreatic cancer.  She denies any seizures.  This episode has not returned.  She does state that she feels dehydrated a lot of the time, she drinks 1 to 2 glasses of water every few days.  I believe her syncopal episode was most likely due to dehydration next with the heat.  Plan: - Encourage patient to keep up with fluid intake 

## 2023-03-14 ENCOUNTER — Inpatient Hospital Stay: Payer: Commercial Managed Care - HMO

## 2023-03-14 ENCOUNTER — Inpatient Hospital Stay (HOSPITAL_BASED_OUTPATIENT_CLINIC_OR_DEPARTMENT_OTHER): Payer: Commercial Managed Care - HMO | Admitting: Oncology

## 2023-03-14 DIAGNOSIS — Z5111 Encounter for antineoplastic chemotherapy: Secondary | ICD-10-CM | POA: Diagnosis not present

## 2023-03-14 DIAGNOSIS — C251 Malignant neoplasm of body of pancreas: Secondary | ICD-10-CM

## 2023-03-14 LAB — CMP (CANCER CENTER ONLY)
ALT: 13 U/L (ref 0–44)
AST: 18 U/L (ref 15–41)
Albumin: 3.3 g/dL — ABNORMAL LOW (ref 3.5–5.0)
Alkaline Phosphatase: 65 U/L (ref 38–126)
Anion gap: 6 (ref 5–15)
BUN: 18 mg/dL (ref 6–20)
CO2: 29 mmol/L (ref 22–32)
Calcium: 9.2 mg/dL (ref 8.9–10.3)
Chloride: 107 mmol/L (ref 98–111)
Creatinine: 0.79 mg/dL (ref 0.44–1.00)
GFR, Estimated: >60 mL/min (ref >60)
Glucose, Bld: 101 mg/dL — ABNORMAL HIGH (ref 70–99)
Potassium: 3.9 mmol/L (ref 3.5–5.1)
Sodium: 142 mmol/L (ref 135–145)
Total Bilirubin: 0.5 mg/dL (ref 0.3–1.2)
Total Protein: 6.2 g/dL — ABNORMAL LOW (ref 6.5–8.1)

## 2023-03-14 LAB — CBC WITH DIFFERENTIAL (CANCER CENTER ONLY)
Abs Immature Granulocytes: 0.04 10*3/uL (ref 0.00–0.07)
Basophils Absolute: 0 10*3/uL (ref 0.0–0.1)
Basophils Relative: 0 %
Eosinophils Absolute: 0.1 10*3/uL (ref 0.0–0.5)
Eosinophils Relative: 1 %
HCT: 35 % — ABNORMAL LOW (ref 36.0–46.0)
Hemoglobin: 11.5 g/dL — ABNORMAL LOW (ref 12.0–15.0)
Immature Granulocytes: 1 %
Lymphocytes Relative: 26 %
Lymphs Abs: 2.1 10*3/uL (ref 0.7–4.0)
MCH: 32.8 pg (ref 26.0–34.0)
MCHC: 32.9 g/dL (ref 30.0–36.0)
MCV: 99.7 fL (ref 80.0–100.0)
Monocytes Absolute: 0.8 10*3/uL (ref 0.1–1.0)
Monocytes Relative: 10 %
Neutro Abs: 5 10*3/uL (ref 1.7–7.7)
Neutrophils Relative %: 62 %
Platelet Count: 185 10*3/uL (ref 150–400)
RBC: 3.51 MIL/uL — ABNORMAL LOW (ref 3.87–5.11)
RDW: 18.7 % — ABNORMAL HIGH (ref 11.5–15.5)
WBC Count: 8 10*3/uL (ref 4.0–10.5)
nRBC: 0 % (ref 0.0–0.2)

## 2023-03-14 NOTE — Progress Notes (Signed)
[facility] OFFICE PROGRESS NOTE   Diagnosis: Pancreas cancer  INTERVAL HISTORY:   Kathleen Stone returns as scheduled.  She is here with her sister.  She completed another cycle of gemcitabine/Abraxane 03/01/2023.  No rash or diarrhea.  She has soreness of the toes, but no peripheral numbness.  She had a syncope event while at the [facility] on 03/07/2023.  She struck her head and back.  A CT of the head revealed a contusion/hematoma in the left parietal scalp.  No fracture.  Chest x-ray revealed no acute change.  Troponins returned low.  She was discharged home.  She continues to have soreness at the scalp and lower back.  Objective:  Vital signs in last 24 hours:  Blood pressure 133/64, pulse 76, temperature 98.2 F (36.8 C), temperature source Oral, resp. rate 18, height 5\' 6"  (1.676 m), weight 159 lb 3.2 oz (72.2 kg), last menstrual period 02/07/2016, SpO2 100 %.    HEENT: No thrush or ulcers Resp: Lungs clear bilaterally Cardio: Regular rate and rhythm GI: Mild tenderness in the mid upper abdomen, no mass, no hepatosplenomegaly Vascular: No leg edema or erythema  Skin: Abrasion or hematoma at the skull or lower back  Portacath/PICC-without erythema  Lab Results:  Lab Results  Component Value Date   WBC 8.0 03/14/2023   HGB 11.5 (L) 03/14/2023   HCT 35.0 (L) 03/14/2023   MCV 99.7 03/14/2023   PLT 185 03/14/2023   NEUTROABS 5.0 03/14/2023    CMP  Lab Results  Component Value Date   NA 138 03/07/2023   K 3.9 03/07/2023   CL 104 03/07/2023   CO2 26 03/07/2023   GLUCOSE 124 (H) 03/07/2023   BUN 16 03/07/2023   CREATININE 0.88 03/07/2023   CALCIUM 9.0 03/07/2023   PROT 6.0 (L) 03/07/2023   ALBUMIN 3.0 (L) 03/07/2023   AST 28 03/07/2023   ALT 22 03/07/2023   ALKPHOS 60 03/07/2023   BILITOT 1.0 03/07/2023   GFRNONAA >60 03/07/2023   GFRAA >60 02/29/2020    Lab Results  Component Value Date   CEA1 27.4 (H) 07/31/2022   WUJ811 16 03/01/2023      Medications: I have reviewed the patient's current medications.   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 CT abdomen/pelvis 07/30/2022-body/neck pancreas mass with peripheral pancreatic atrophy and duct dilation, suspected encasement of the celiac axis, uterine fibroids MRI abdomen 07/30/2022-3.7 x 4.7 cm pancreas body/tail mass with encasement of the proximal celiac artery, occlusion with collateralization at the confluence of the SMV and portal vein, no evidence of metastatic disease CT chest 08/01/2022-no intrathoracic metastases Elevated CA 19-9 EUS 08/02/2022-gastric polyp, gastric erosions, 32 x 33 mm pancreas body mass, invasion of the celiac trunk, invasion of the splenoportal confluence, no SMA invasion, 2 malignant appearing nodes in the porta hepatis,T4N1 by endoscopic criteria, FNA biopsy-adenocarcinoma 08/17/2022 [facility]-tumor mutation burden 0.96; MSI high not detected; variant of uncertain clinical significance NTRK3 T93M Cycle 1 gemcitabine/Abraxane 08/17/2022 Cycle 2 gemcitabine/Abraxane 08/31/2022 Cycle 3 gemcitabine/Abraxane 09/14/2022 Cycle 4 gemcitabine/Abraxane 09/28/2022 Cycle 5 gemcitabine/Abraxane 10/12/2022 Cycle 6 gemcitabine/Abraxane 10/26/2022 CT abdomen/pelvis 10/27/2022-stable pancreas body mass with encasement of the celiac axis and portal vein with occlusion of the splenic vein and varices.  Stable porta hepatis lymph nodes. Cycle 7 gemcitabine/Abraxane 11/09/2022 Cycle 8 gemcitabine/Abraxane 11/23/2022 Cycle 9 gemcitabine/Abraxane 12/07/2022 Cycle 10 gemcitabine/Abraxane 12/21/2022 Cycle 11 gemcitabine/Abraxane 01/04/2023 Cycle 12 gemcitabine/Abraxane 01/18/2023 Cycle 13 gemcitabine/Abraxane 02/01/2023 Cycle 14 gemcitabine/Abraxane 02/15/2023 CT abdomen/pelvis 02/26/2023-stable appearance of pancreas body mass with vascular encasement and chronic occlusion  of the splenic vein, no evidence of metastatic disease Cycle 15 gemcitabine/Abraxane 03/01/2023 Cycle 16  gemcitabine/Abraxane 03/07/2023   2.  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  Anorexia/weight loss secondary to #1 4.  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  Abdominal pain 6.  History of colon polyps 7.  Family history of multiple cancers Pancreas cancer maternal aunt, maternal cousin, maternal grandfather Breast cancer-maternal aunt, maternal cousin Prostate cancer-maternal uncle Lung cancer-father 8.  Hypertension 9.  Postmenopausal vaginal bleeding 10.  H. pylori 08/02/2022-amoxicillin and Flagyl discontinued 08/21/2022 due to GI symptoms 11.  Syncope event 03/07/2023        Disposition: Kathleen Stone has a history of locally advanced pancreas cancer.  Her clinical status and the CA 19-9 have improved with gemcitabine/Abraxane.  The pancreas mass is smaller compared to when she was diagnosed last year.  Her case was presented at the GI tumor conference on 03/13/2023.  The tumor is unresectable.  Treatment options include continuing chemotherapy and SBRT.  I discussed these options with Kathleen Stone and her sister.  The goal of SBRT is to give her a break from chemotherapy and potentially achieve a durable remission.  She understands the chance of curative therapy is very small.  The alternative is to continue gemcitabine/Abraxane.  Kathleen Stone would like to discuss these options further with her family.  She will contact us with her decision next week.  She will complete another cycle of gemcitabine/Abraxane 03/15/2023.  She will return for an office and lab visit in 2 weeks.  The etiology of the syncope event on 03/07/2023 is unclear.  This does not appear related to chemotherapy.  She feels she may have been dehydrated.  I encouraged her to increase oral hydration.    Thornton Papas, MD  03/14/2023  10:57 AM

## 2023-03-15 ENCOUNTER — Inpatient Hospital Stay: Payer: Commercial Managed Care - HMO

## 2023-03-15 VITALS — BP 109/67 | HR 55 | Temp 98.1°F | Resp 19

## 2023-03-15 DIAGNOSIS — Z5111 Encounter for antineoplastic chemotherapy: Secondary | ICD-10-CM | POA: Diagnosis not present

## 2023-03-15 DIAGNOSIS — C251 Malignant neoplasm of body of pancreas: Secondary | ICD-10-CM

## 2023-03-15 MED ORDER — HEPARIN SOD (PORK) LOCK FLUSH 100 UNIT/ML IV SOLN
500.0000 [IU] | Freq: Once | INTRAVENOUS | Status: AC | PRN
  Administered 2023-03-15: 500 [IU]

## 2023-03-15 MED ORDER — SODIUM CHLORIDE 0.9% FLUSH
10.0000 mL | INTRAVENOUS | Status: DC | PRN
  Administered 2023-03-15: 10 mL

## 2023-03-15 MED ORDER — SODIUM CHLORIDE 0.9 % IV SOLN
1000.0000 mg/m2 | Freq: Once | INTRAVENOUS | Status: AC
  Administered 2023-03-15: 1786 mg via INTRAVENOUS
  Filled 2023-03-15: qty 26.28

## 2023-03-15 MED ORDER — SODIUM CHLORIDE 0.9 % IV SOLN: Freq: Once | INTRAVENOUS | Status: AC

## 2023-03-15 MED ORDER — PACLITAXEL PROTEIN-BOUND CHEMO INJECTION 100 MG
100.0000 mg/m2 | Freq: Once | INTRAVENOUS | Status: AC
  Administered 2023-03-15: 200 mg via INTRAVENOUS
  Filled 2023-03-15: qty 40

## 2023-03-15 MED ORDER — PROCHLORPERAZINE MALEATE 10 MG PO TABS
10.0000 mg | ORAL_TABLET | Freq: Once | ORAL | Status: AC
  Administered 2023-03-15: 10 mg via ORAL
  Filled 2023-03-15: qty 1

## 2023-03-15 NOTE — Patient Instructions (Signed)
[facility]   Discharge Instructions: Thank you for choosing [facility] to provide your oncology and hematology care.   If you have a lab appointment with the Cancer Center, please go directly to the Cancer Center and check in at the registration area.   Wear comfortable clothing and clothing appropriate for easy access to any Portacath or PICC line.   We strive to give you quality time with your provider. You may need to reschedule your appointment if you arrive late (15 or more minutes).  Arriving late affects you and other patients whose appointments are after yours.  Also, if you miss three or more appointments without notifying the office, you may be dismissed from the clinic at the provider's discretion.      For prescription refill requests, have your pharmacy contact our office and allow 72 hours for refills to be completed.    Today you received the following chemotherapy and/or immunotherapy agents Abraxane/Gemzar      To help prevent nausea and vomiting after your treatment, we encourage you to take your nausea medication as directed.  BELOW ARE SYMPTOMS THAT SHOULD BE REPORTED IMMEDIATELY: *FEVER GREATER THAN 100.4 F (38 C) OR HIGHER *CHILLS OR SWEATING *NAUSEA AND VOMITING THAT IS NOT CONTROLLED WITH YOUR NAUSEA MEDICATION *UNUSUAL SHORTNESS OF BREATH *UNUSUAL BRUISING OR BLEEDING *URINARY PROBLEMS (pain or burning when urinating, or frequent urination) *BOWEL PROBLEMS (unusual diarrhea, constipation, pain near the anus) TENDERNESS IN MOUTH AND THROAT WITH OR WITHOUT PRESENCE OF ULCERS (sore throat, sores in mouth, or a toothache) UNUSUAL RASH, SWELLING OR PAIN  UNUSUAL VAGINAL DISCHARGE OR ITCHING   Items with * indicate a potential emergency and should be followed up as soon as possible or go to the Emergency Department if any problems should occur.  Please show the CHEMOTHERAPY ALERT CARD or IMMUNOTHERAPY ALERT CARD at  check-in to the Emergency Department and triage nurse.  Should you have questions after your visit or need to cancel or reschedule your appointment, please contact [facility]  Dept: [phone]  and follow the prompts.  Office hours are 8:00 a.m. to 4:30 p.m. Monday - Friday. Please note that voicemails left after 4:00 p.m. may not be returned until the following business day.  We are closed weekends and major holidays. You have access to a nurse at all times for urgent questions. Please call the main number to the clinic Dept: [phone] and follow the prompts.   For any non-urgent questions, you may also contact your provider using MyChart. We now offer e-Visits for anyone 18 and older to request care online for non-urgent symptoms. For details visit [email or web address].   Also download the MyChart app! Go to the app store, search "MyChart", open the app, select [facility], and log in with your MyChart username and password.  Paclitaxel Nanoparticle Albumin-Bound Injection What is this medication? NANOPARTICLE ALBUMIN-BOUND PACLITAXEL (Na no PAHR ti kuhl al BYOO muhn-bound PAK li TAX el) treats some types of cancer. It works by slowing down the growth of cancer cells. This medicine may be used for other purposes; ask your health care provider or pharmacist if you have questions. COMMON BRAND NAME(S): Abraxane What should I tell my care team before I take this medication? They need to know if you have any of these conditions: Liver disease Low white blood cell levels An unusual or allergic reaction to paclitaxel, albumin, other medications, foods, dyes, or preservatives If you   or your partner are pregnant or trying to get pregnant Breast-feeding How should I use this medication? This medication is injected into a vein. It is given by your care team in a hospital or clinic setting. Talk to your care team about the use of this medication in children.  Special care may be needed. Overdosage: If you think you have taken too much of this medicine contact a poison control center or emergency room at once. NOTE: This medicine is only for you. Do not share this medicine with others. What if I miss a dose? Keep appointments for follow-up doses. It is important not to miss your dose. Call your care team if you are unable to keep an appointment. What may interact with this medication? Other medications may affect the way this medication works. Talk with your care team about all of the medications you take. They may suggest changes to your treatment plan to lower the risk of side effects and to make sure your medications work as intended. This list may not describe all possible interactions. Give your health care provider a list of all the medicines, herbs, non-prescription drugs, or dietary supplements you use. Also tell them if you smoke, drink alcohol, or use illegal drugs. Some items may interact with your medicine. What should I watch for while using this medication? Your condition will be monitored carefully while you are receiving this medication. You may need blood work while taking this medication. This medication may make you feel generally unwell. This is not uncommon as chemotherapy can affect healthy cells as well as cancer cells. Report any side effects. Continue your course of treatment even though you feel ill unless your care team tells you to stop. This medication can cause serious allergic reactions. To reduce the risk, your care team may give you other medications to take before receiving this one. Be sure to follow the directions from your care team. This medication may increase your risk of getting an infection. Call your care team for advice if you get a fever, chills, sore throat, or other symptoms of a cold or flu. Do not treat yourself. Try to avoid being around people who are sick. This medication may increase your risk to bruise or  bleed. Call your care team if you notice any unusual bleeding. Be careful brushing or flossing your teeth or using a toothpick because you may get an infection or bleed more easily. If you have any dental work done, tell your dentist you are receiving this medication. Talk to your care team if you or your partner may be pregnant. Serious birth defects can occur if you take this medication during pregnancy and for 6 months after the last dose. You will need a negative pregnancy test before starting this medication. Contraception is recommended while taking this medication and for 6 months after the last dose. Your care team can help you find the option that works for you. If your partner can get pregnant, use a condom during sex while taking this medication and for 3 months after the last dose. Do not breastfeed while taking this medication and for 2 weeks after the last dose. This medication may cause infertility. Talk to your care team if you are concerned about your fertility. What side effects may I notice from receiving this medication? Side effects that you should report to your care team as soon as possible: Allergic reactions--skin rash, itching, hives, swelling of the face, lips, tongue, or throat Dry cough, shortness of   breath or trouble breathing Infection--fever, chills, cough, sore throat, wounds that don't heal, pain or trouble when passing urine, general feeling of discomfort or being unwell Low red blood cell level--unusual weakness or fatigue, dizziness, headache, trouble breathing Pain, tingling, or numbness in the hands or feet Stomach pain, unusual weakness or fatigue, nausea, vomiting, diarrhea, or fever that lasts longer than expected Unusual bruising or bleeding Side effects that usually do not require medical attention (report to your care team if they continue or are bothersome): Diarrhea Fatigue Hair loss Loss of appetite Nausea Vomiting This list may not describe all  possible side effects. Call your doctor for medical advice about side effects. You may report side effects to FDA at 1-800-FDA-1088. Where should I keep my medication? This medication is given in a hospital or clinic. It will not be stored at home. NOTE: This sheet is a summary. It may not cover all possible information. If you have questions about this medicine, talk to your doctor, pharmacist, or health care provider.  2024 Elsevier/Gold Standard (2022-01-18 00:00:00)  Gemcitabine Injection What is this medication? GEMCITABINE (jem SYE ta been) treats some types of cancer. It works by slowing down the growth of cancer cells. This medicine may be used for other purposes; ask your health care provider or pharmacist if you have questions. COMMON BRAND NAME(S): Gemzar, Infugem What should I tell my care team before I take this medication? They need to know if you have any of these conditions: Blood disorders Infection Kidney disease Liver disease Lung or breathing disease, such as asthma or COPD Recent or ongoing radiation therapy An unusual or allergic reaction to gemcitabine, other medications, foods, dyes, or preservatives If you or your partner are pregnant or trying to get pregnant Breast-feeding How should I use this medication? This medication is injected into a vein. It is given by your care team in a hospital or clinic setting. Talk to your care team about the use of this medication in children. Special care may be needed. Overdosage: If you think you have taken too much of this medicine contact a poison control center or emergency room at once. NOTE: This medicine is only for you. Do not share this medicine with others. What if I miss a dose? Keep appointments for follow-up doses. It is important not to miss your dose. Call your care team if you are unable to keep an appointment. What may interact with this medication? Interactions have not been studied. This list may not  describe all possible interactions. Give your health care provider a list of all the medicines, herbs, non-prescription drugs, or dietary supplements you use. Also tell them if you smoke, drink alcohol, or use illegal drugs. Some items may interact with your medicine. What should I watch for while using this medication? Your condition will be monitored carefully while you are receiving this medication. This medication may make you feel generally unwell. This is not uncommon, as chemotherapy can affect healthy cells as well as cancer cells. Report any side effects. Continue your course of treatment even though you feel ill unless your care team tells you to stop. In some cases, you may be given additional medications to help with side effects. Follow all directions for their use. This medication may increase your risk of getting an infection. Call your care team for advice if you get a fever, chills, sore throat, or other symptoms of a cold or flu. Do not treat yourself. Try to avoid being around people who   are sick. This medication may increase your risk to bruise or bleed. Call your care team if you notice any unusual bleeding. Be careful brushing or flossing your teeth or using a toothpick because you may get an infection or bleed more easily. If you have any dental work done, tell your dentist you are receiving this medication. Avoid taking medications that contain aspirin, acetaminophen, ibuprofen, naproxen, or ketoprofen unless instructed by your care team. These medications may hide a fever. Talk to your care team if you or your partner wish to become pregnant or think you might be pregnant. This medication can cause serious birth defects if taken during pregnancy and for 6 months after the last dose. A negative pregnancy test is required before starting this medication. A reliable form of contraception is recommended while taking this medication and for 6 months after the last dose. Talk to your care  team about effective forms of contraception. Do not father a child while taking this medication and for 3 months after the last dose. Use a condom while having sex during this time period. Do not breastfeed while taking this medication and for at least 1 week after the last dose. This medication may cause infertility. Talk to your care team if you are concerned about your fertility. What side effects may I notice from receiving this medication? Side effects that you should report to your care team as soon as possible: Allergic reactions--skin rash, itching, hives, swelling of the face, lips, tongue, or throat Capillary leak syndrome--stomach or muscle pain, unusual weakness or fatigue, feeling faint or lightheaded, decrease in the amount of urine, swelling of the ankles, hands, or feet, trouble breathing Infection--fever, chills, cough, sore throat, wounds that don't heal, pain or trouble when passing urine, general feeling of discomfort or being unwell Liver injury--right upper belly pain, loss of appetite, nausea, light-colored stool, dark yellow or brown urine, yellowing skin or eyes, unusual weakness or fatigue Low red blood cell level--unusual weakness or fatigue, dizziness, headache, trouble breathing Lung injury--shortness of breath or trouble breathing, cough, spitting up blood, chest pain, fever Stomach pain, bloody diarrhea, pale skin, unusual weakness or fatigue, decrease in the amount of urine, which may be signs of hemolytic uremic syndrome Sudden and severe headache, confusion, change in vision, seizures, which may be signs of posterior reversible encephalopathy syndrome (PRES) Unusual bruising or bleeding Side effects that usually do not require medical attention (report to your care team if they continue or are bothersome): Diarrhea Drowsiness Hair loss Nausea Pain, redness, or swelling with sores inside the mouth or throat Vomiting This list may not describe all possible side  effects. Call your doctor for medical advice about side effects. You may report side effects to FDA at 1-800-FDA-1088. Where should I keep my medication? This medication is given in a hospital or clinic. It will not be stored at home. NOTE: This sheet is a summary. It may not cover all possible information. If you have questions about this medicine, talk to your doctor, pharmacist, or health care provider.  2024 Elsevier/Gold Standard (2022-01-09 00:00:00)  

## 2023-03-19 ENCOUNTER — Ambulatory Visit: Payer: Commercial Managed Care - HMO

## 2023-03-20 ENCOUNTER — Encounter: Payer: Commercial Managed Care - HMO | Admitting: Internal Medicine

## 2023-03-21 NOTE — Progress Notes (Signed)
Internal Medicine Clinic Attending  Case discussed with Dr. Nooruddin  at the time of the visit.  We reviewed the resident's history and exam and pertinent patient test results.  I agree with the assessment, diagnosis, and plan of care documented in the resident's note.  

## 2023-03-24 ENCOUNTER — Other Ambulatory Visit: Payer: Self-pay | Admitting: Oncology

## 2023-03-29 ENCOUNTER — Inpatient Hospital Stay: Payer: Commercial Managed Care - HMO

## 2023-03-29 ENCOUNTER — Inpatient Hospital Stay: Payer: Commercial Managed Care - HMO | Admitting: Nurse Practitioner

## 2023-03-29 ENCOUNTER — Encounter: Payer: Self-pay | Admitting: Nurse Practitioner

## 2023-03-29 ENCOUNTER — Inpatient Hospital Stay: Payer: Commercial Managed Care - HMO | Attending: Oncology

## 2023-03-29 ENCOUNTER — Other Ambulatory Visit: Payer: Self-pay | Admitting: *Deleted

## 2023-03-29 VITALS — BP 128/51 | HR 62 | Temp 98.0°F | Resp 18 | Ht 66.0 in | Wt 152.0 lb

## 2023-03-29 VITALS — BP 121/63 | HR 61 | Resp 18

## 2023-03-29 DIAGNOSIS — R630 Anorexia: Secondary | ICD-10-CM | POA: Insufficient documentation

## 2023-03-29 DIAGNOSIS — Z8042 Family history of malignant neoplasm of prostate: Secondary | ICD-10-CM | POA: Diagnosis not present

## 2023-03-29 DIAGNOSIS — R634 Abnormal weight loss: Secondary | ICD-10-CM | POA: Diagnosis not present

## 2023-03-29 DIAGNOSIS — Z5111 Encounter for antineoplastic chemotherapy: Secondary | ICD-10-CM | POA: Diagnosis present

## 2023-03-29 DIAGNOSIS — C251 Malignant neoplasm of body of pancreas: Secondary | ICD-10-CM

## 2023-03-29 DIAGNOSIS — Z801 Family history of malignant neoplasm of trachea, bronchus and lung: Secondary | ICD-10-CM | POA: Insufficient documentation

## 2023-03-29 DIAGNOSIS — Z800 Family history of malignant neoplasm of digestive organs: Secondary | ICD-10-CM | POA: Diagnosis not present

## 2023-03-29 DIAGNOSIS — Z803 Family history of malignant neoplasm of breast: Secondary | ICD-10-CM | POA: Insufficient documentation

## 2023-03-29 DIAGNOSIS — R197 Diarrhea, unspecified: Secondary | ICD-10-CM | POA: Insufficient documentation

## 2023-03-29 LAB — CBC WITH DIFFERENTIAL (CANCER CENTER ONLY)
Abs Immature Granulocytes: 0.03 10*3/uL (ref 0.00–0.07)
Basophils Absolute: 0 10*3/uL (ref 0.0–0.1)
Basophils Relative: 0 %
Eosinophils Absolute: 0.1 10*3/uL (ref 0.0–0.5)
Eosinophils Relative: 1 %
HCT: 35.3 % — ABNORMAL LOW (ref 36.0–46.0)
Hemoglobin: 11.4 g/dL — ABNORMAL LOW (ref 12.0–15.0)
Immature Granulocytes: 0 %
Lymphocytes Relative: 28 %
Lymphs Abs: 2.1 10*3/uL (ref 0.7–4.0)
MCH: 33.2 pg (ref 26.0–34.0)
MCHC: 32.3 g/dL (ref 30.0–36.0)
MCV: 102.9 fL — ABNORMAL HIGH (ref 80.0–100.0)
Monocytes Absolute: 0.7 10*3/uL (ref 0.1–1.0)
Monocytes Relative: 9 %
Neutro Abs: 4.7 10*3/uL (ref 1.7–7.7)
Neutrophils Relative %: 62 %
Platelet Count: 211 10*3/uL (ref 150–400)
RBC: 3.43 MIL/uL — ABNORMAL LOW (ref 3.87–5.11)
RDW: 17.8 % — ABNORMAL HIGH (ref 11.5–15.5)
WBC Count: 7.6 10*3/uL (ref 4.0–10.5)
nRBC: 0 % (ref 0.0–0.2)

## 2023-03-29 LAB — CMP (CANCER CENTER ONLY)
ALT: 13 U/L (ref 0–44)
AST: 20 U/L (ref 15–41)
Albumin: 3.3 g/dL — ABNORMAL LOW (ref 3.5–5.0)
Alkaline Phosphatase: 63 U/L (ref 38–126)
Anion gap: 5 (ref 5–15)
BUN: 16 mg/dL (ref 6–20)
CO2: 29 mmol/L (ref 22–32)
Calcium: 9 mg/dL (ref 8.9–10.3)
Chloride: 109 mmol/L (ref 98–111)
Creatinine: 0.77 mg/dL (ref 0.44–1.00)
GFR, Estimated: >60 mL/min (ref >60)
Glucose, Bld: 149 mg/dL — ABNORMAL HIGH (ref 70–99)
Potassium: 3.8 mmol/L (ref 3.5–5.1)
Sodium: 143 mmol/L (ref 135–145)
Total Bilirubin: 0.3 mg/dL (ref 0.3–1.2)
Total Protein: 6.2 g/dL — ABNORMAL LOW (ref 6.5–8.1)

## 2023-03-29 MED ORDER — SODIUM CHLORIDE 0.9% FLUSH
10.0000 mL | INTRAVENOUS | Status: DC | PRN
  Administered 2023-03-29: 10 mL

## 2023-03-29 MED ORDER — PACLITAXEL PROTEIN-BOUND CHEMO INJECTION 100 MG
100.0000 mg/m2 | Freq: Once | INTRAVENOUS | Status: AC
  Administered 2023-03-29: 200 mg via INTRAVENOUS
  Filled 2023-03-29: qty 40

## 2023-03-29 MED ORDER — PROCHLORPERAZINE MALEATE 10 MG PO TABS
10.0000 mg | ORAL_TABLET | Freq: Once | ORAL | Status: AC
  Administered 2023-03-29: 10 mg via ORAL
  Filled 2023-03-29: qty 1

## 2023-03-29 MED ORDER — SODIUM CHLORIDE 0.9 % IV SOLN
1000.0000 mg/m2 | Freq: Once | INTRAVENOUS | Status: AC
  Administered 2023-03-29: 1786 mg via INTRAVENOUS
  Filled 2023-03-29: qty 26.28

## 2023-03-29 MED ORDER — SODIUM CHLORIDE 0.9 % IV SOLN: Freq: Once | INTRAVENOUS | Status: AC

## 2023-03-29 MED ORDER — HEPARIN SOD (PORK) LOCK FLUSH 100 UNIT/ML IV SOLN
500.0000 [IU] | Freq: Once | INTRAVENOUS | Status: AC | PRN
  Administered 2023-03-29: 500 [IU]

## 2023-03-29 NOTE — Progress Notes (Signed)
[facility] OFFICE PROGRESS NOTE   Diagnosis: Pancreas cancer  INTERVAL HISTORY:   Kathleen Stone returns as scheduled.  She completed another cycle of gemcitabine/Abraxane 03/15/2023.  She denies nausea/vomiting.  No mouth sores.  No diarrhea.  No numbness or tingling in the hands or feet.  No fever or rash after treatment.  Objective:  Vital signs in last 24 hours:  Blood pressure (!) 128/51, pulse 62, temperature 98 F (36.7 C), temperature source Oral, height 5\' 6"  (1.676 m), weight 152 lb (68.9 kg), last menstrual period 02/07/2016, SpO2 100%.    HEENT: No thrush or ulcers. Resp: Lungs clear bilaterally. Cardio: Regular rate and rhythm. GI: No hepatosplenomegaly. Vascular: No leg edema. Port-A-Cath without erythema.  Lab Results:  Lab Results  Component Value Date   WBC 7.6 03/29/2023   HGB 11.4 (L) 03/29/2023   HCT 35.3 (L) 03/29/2023   MCV 102.9 (H) 03/29/2023   PLT 211 03/29/2023   NEUTROABS 4.7 03/29/2023    Imaging:  No results found.  Medications: I have reviewed the patient's current medications.  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 CT abdomen/pelvis 07/30/2022-body/neck pancreas mass with peripheral pancreatic atrophy and duct dilation, suspected encasement of the celiac axis, uterine fibroids MRI abdomen 07/30/2022-3.7 x 4.7 cm pancreas body/tail mass with encasement of the proximal celiac artery, occlusion with collateralization at the confluence of the SMV and portal vein, no evidence of metastatic disease CT chest 08/01/2022-no intrathoracic metastases Elevated CA 19-9 EUS 08/02/2022-gastric polyp, gastric erosions, 32 x 33 mm pancreas body mass, invasion of the celiac trunk, invasion of the splenoportal confluence, no SMA invasion, 2 malignant appearing nodes in the porta hepatis,T4N1 by endoscopic criteria, FNA biopsy-adenocarcinoma 08/17/2022 [facility]-tumor mutation burden 0.96; MSI high not detected; 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  Stable porta hepatis lymph nodes. Cycle 7 gemcitabine/Abraxane 11/09/2022 Cycle 8 gemcitabine/Abraxane 11/23/2022 Cycle 9 gemcitabine/Abraxane 12/07/2022 Cycle 10 gemcitabine/Abraxane 12/21/2022 Cycle 11 gemcitabine/Abraxane 01/04/2023 Cycle 12 gemcitabine/Abraxane 01/18/2023 Cycle 13 gemcitabine/Abraxane 02/01/2023 Cycle 14 gemcitabine/Abraxane 02/15/2023 CT abdomen/pelvis 02/26/2023-stable appearance of pancreas body mass with vascular encasement and chronic occlusion of the splenic vein, no evidence of metastatic disease Cycle 15 gemcitabine/Abraxane 03/01/2023 Cycle 16 gemcitabine/Abraxane 03/15/2023 Cycle 17 Gemcitabine/Abraxane 03/29/2023    2.  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  Anorexia/weight loss secondary to #1 4.  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  Abdominal pain 6.  History of colon polyps 7.  Family history of multiple cancers Pancreas cancer maternal aunt, maternal cousin, maternal grandfather Breast cancer-maternal aunt, maternal cousin Prostate cancer-maternal uncle Lung cancer-father 8.  Hypertension 9.  Postmenopausal vaginal bleeding 10.  H. pylori 08/02/2022-amoxicillin and Flagyl discontinued 08/21/2022 due to GI symptoms 11.  Syncope event 03/07/2023  Disposition: Ms. Legg appears stable.  She is on active treatment with gemcitabine/Abraxane.  She is tolerating treatment well.  Clinical status and CA 19-9 have improved with treatment.  At her last office visit Dr. Truett Perna  discussed continuing chemotherapy versus referring for SBRT.  She had her family have considered these options.  She would like to be referred for SBRT.  Plan to proceed with gemcitabine/Abraxane today as scheduled.  Referral placed to Dr. Mitzi Hansen.  CBC and chemistry panel reviewed.  Labs adequate to proceed with treatment today.  She will return for follow-up in 4 to 5 weeks.  We are available to see her sooner if needed.     [name] ANP/GNP-BC   03/29/2023  9:21 AM

## 2023-03-29 NOTE — Progress Notes (Signed)
Patient seen by Lisa Thomas NP today  Vitals are within treatment parameters.  Labs reviewed by Lisa Thomas NP and are within treatment parameters.  Per physician team, patient is ready for treatment and there are NO modifications to the treatment plan.     

## 2023-03-29 NOTE — Patient Instructions (Signed)
[facility]   Discharge Instructions: Thank you for choosing [facility] to provide your oncology and hematology care.   If you have a lab appointment with the Cancer Center, please go directly to the Cancer Center and check in at the registration area.   Wear comfortable clothing and clothing appropriate for easy access to any Portacath or PICC line.   We strive to give you quality time with your provider. You may need to reschedule your appointment if you arrive late (15 or more minutes).  Arriving late affects you and other patients whose appointments are after yours.  Also, if you miss three or more appointments without notifying the office, you may be dismissed from the clinic at the provider's discretion.      For prescription refill requests, have your pharmacy contact our office and allow 72 hours for refills to be completed.    Today you received the following chemotherapy and/or immunotherapy agents Paclitaxel-protein bound (ABRAXANE) & Gemcitabine (GEMZAR).   To help prevent nausea and vomiting after your treatment, we encourage you to take your nausea medication as directed.  BELOW ARE SYMPTOMS THAT SHOULD BE REPORTED IMMEDIATELY: *FEVER GREATER THAN 100.4 F (38 C) OR HIGHER *CHILLS OR SWEATING *NAUSEA AND VOMITING THAT IS NOT CONTROLLED WITH YOUR NAUSEA MEDICATION *UNUSUAL SHORTNESS OF BREATH *UNUSUAL BRUISING OR BLEEDING *URINARY PROBLEMS (pain or burning when urinating, or frequent urination) *BOWEL PROBLEMS (unusual diarrhea, constipation, pain near the anus) TENDERNESS IN MOUTH AND THROAT WITH OR WITHOUT PRESENCE OF ULCERS (sore throat, sores in mouth, or a toothache) UNUSUAL RASH, SWELLING OR PAIN  UNUSUAL VAGINAL DISCHARGE OR ITCHING   Items with * indicate a potential emergency and should be followed up as soon as possible or go to the Emergency Department if any problems should occur.  Please show the CHEMOTHERAPY  ALERT CARD or IMMUNOTHERAPY ALERT CARD at check-in to the Emergency Department and triage nurse.  Should you have questions after your visit or need to cancel or reschedule your appointment, please contact [facility]  Dept: [phone]  and follow the prompts.  Office hours are 8:00 a.m. to 4:30 p.m. Monday - Friday. Please note that voicemails left after 4:00 p.m. may not be returned until the following business day.  We are closed weekends and major holidays. You have access to a nurse at all times for urgent questions. Please call the main number to the clinic Dept: [phone] and follow the prompts.   For any non-urgent questions, you may also contact your provider using MyChart. We now offer e-Visits for anyone 18 and older to request care online for non-urgent symptoms. For details visit [email or web address].   Also download the MyChart app! Go to the app store, search "MyChart", open the app, select [facility], and log in with your MyChart username and password.  Paclitaxel Nanoparticle Albumin-Bound Injection What is this medication? NANOPARTICLE ALBUMIN-BOUND PACLITAXEL (Na no PAHR ti kuhl al BYOO muhn-bound PAK li TAX el) treats some types of cancer. It works by slowing down the growth of cancer cells. This medicine may be used for other purposes; ask your health care provider or pharmacist if you have questions. COMMON BRAND NAME(S): Abraxane What should I tell my care team before I take this medication? They need to know if you have any of these conditions: Liver disease Low white blood cell levels An unusual or allergic reaction to paclitaxel, albumin, other medications, foods, dyes, or preservatives   If you or your partner are pregnant or trying to get pregnant Breast-feeding How should I use this medication? This medication is injected into a vein. It is given by your care team in a hospital or clinic setting. Talk to your care team  about the use of this medication in children. Special care may be needed. Overdosage: If you think you have taken too much of this medicine contact a poison control center or emergency room at once. NOTE: This medicine is only for you. Do not share this medicine with others. What if I miss a dose? Keep appointments for follow-up doses. It is important not to miss your dose. Call your care team if you are unable to keep an appointment. What may interact with this medication? Other medications may affect the way this medication works. Talk with your care team about all of the medications you take. They may suggest changes to your treatment plan to lower the risk of side effects and to make sure your medications work as intended. This list may not describe all possible interactions. Give your health care provider a list of all the medicines, herbs, non-prescription drugs, or dietary supplements you use. Also tell them if you smoke, drink alcohol, or use illegal drugs. Some items may interact with your medicine. What should I watch for while using this medication? Your condition will be monitored carefully while you are receiving this medication. You may need blood work while taking this medication. This medication may make you feel generally unwell. This is not uncommon as chemotherapy can affect healthy cells as well as cancer cells. Report any side effects. Continue your course of treatment even though you feel ill unless your care team tells you to stop. This medication can cause serious allergic reactions. To reduce the risk, your care team may give you other medications to take before receiving this one. Be sure to follow the directions from your care team. This medication may increase your risk of getting an infection. Call your care team for advice if you get a fever, chills, sore throat, or other symptoms of a cold or flu. Do not treat yourself. Try to avoid being around people who are sick. This  medication may increase your risk to bruise or bleed. Call your care team if you notice any unusual bleeding. Be careful brushing or flossing your teeth or using a toothpick because you may get an infection or bleed more easily. If you have any dental work done, tell your dentist you are receiving this medication. Talk to your care team if you or your partner may be pregnant. Serious birth defects can occur if you take this medication during pregnancy and for 6 months after the last dose. You will need a negative pregnancy test before starting this medication. Contraception is recommended while taking this medication and for 6 months after the last dose. Your care team can help you find the option that works for you. If your partner can get pregnant, use a condom during sex while taking this medication and for 3 months after the last dose. Do not breastfeed while taking this medication and for 2 weeks after the last dose. This medication may cause infertility. Talk to your care team if you are concerned about your fertility. What side effects may I notice from receiving this medication? Side effects that you should report to your care team as soon as possible: Allergic reactions--skin rash, itching, hives, swelling of the face, lips, tongue, or throat Dry cough,   shortness of breath or trouble breathing Infection--fever, chills, cough, sore throat, wounds that don't heal, pain or trouble when passing urine, general feeling of discomfort or being unwell Low red blood cell level--unusual weakness or fatigue, dizziness, headache, trouble breathing Pain, tingling, or numbness in the hands or feet Stomach pain, unusual weakness or fatigue, nausea, vomiting, diarrhea, or fever that lasts longer than expected Unusual bruising or bleeding Side effects that usually do not require medical attention (report to your care team if they continue or are bothersome): Diarrhea Fatigue Hair loss Loss of  appetite Nausea Vomiting This list may not describe all possible side effects. Call your doctor for medical advice about side effects. You may report side effects to FDA at 1-800-FDA-1088. Where should I keep my medication? This medication is given in a hospital or clinic. It will not be stored at home. NOTE: This sheet is a summary. It may not cover all possible information. If you have questions about this medicine, talk to your doctor, pharmacist, or health care provider.  2024 Elsevier/Gold Standard (2022-01-18 00:00:00)  Gemcitabine Injection What is this medication? GEMCITABINE (jem SYE ta been) treats some types of cancer. It works by slowing down the growth of cancer cells. This medicine may be used for other purposes; ask your health care provider or pharmacist if you have questions. COMMON BRAND NAME(S): Gemzar, Infugem What should I tell my care team before I take this medication? They need to know if you have any of these conditions: Blood disorders Infection Kidney disease Liver disease Lung or breathing disease, such as asthma or COPD Recent or ongoing radiation therapy An unusual or allergic reaction to gemcitabine, other medications, foods, dyes, or preservatives If you or your partner are pregnant or trying to get pregnant Breast-feeding How should I use this medication? This medication is injected into a vein. It is given by your care team in a hospital or clinic setting. Talk to your care team about the use of this medication in children. Special care may be needed. Overdosage: If you think you have taken too much of this medicine contact a poison control center or emergency room at once. NOTE: This medicine is only for you. Do not share this medicine with others. What if I miss a dose? Keep appointments for follow-up doses. It is important not to miss your dose. Call your care team if you are unable to keep an appointment. What may interact with this  medication? Interactions have not been studied. This list may not describe all possible interactions. Give your health care provider a list of all the medicines, herbs, non-prescription drugs, or dietary supplements you use. Also tell them if you smoke, drink alcohol, or use illegal drugs. Some items may interact with your medicine. What should I watch for while using this medication? Your condition will be monitored carefully while you are receiving this medication. This medication may make you feel generally unwell. This is not uncommon, as chemotherapy can affect healthy cells as well as cancer cells. Report any side effects. Continue your course of treatment even though you feel ill unless your care team tells you to stop. In some cases, you may be given additional medications to help with side effects. Follow all directions for their use. This medication may increase your risk of getting an infection. Call your care team for advice if you get a fever, chills, sore throat, or other symptoms of a cold or flu. Do not treat yourself. Try to avoid being around   people who are sick. This medication may increase your risk to bruise or bleed. Call your care team if you notice any unusual bleeding. Be careful brushing or flossing your teeth or using a toothpick because you may get an infection or bleed more easily. If you have any dental work done, tell your dentist you are receiving this medication. Avoid taking medications that contain aspirin, acetaminophen, ibuprofen, naproxen, or ketoprofen unless instructed by your care team. These medications may hide a fever. Talk to your care team if you or your partner wish to become pregnant or think you might be pregnant. This medication can cause serious birth defects if taken during pregnancy and for 6 months after the last dose. A negative pregnancy test is required before starting this medication. A reliable form of contraception is recommended while taking  this medication and for 6 months after the last dose. Talk to your care team about effective forms of contraception. Do not father a child while taking this medication and for 3 months after the last dose. Use a condom while having sex during this time period. Do not breastfeed while taking this medication and for at least 1 week after the last dose. This medication may cause infertility. Talk to your care team if you are concerned about your fertility. What side effects may I notice from receiving this medication? Side effects that you should report to your care team as soon as possible: Allergic reactions--skin rash, itching, hives, swelling of the face, lips, tongue, or throat Capillary leak syndrome--stomach or muscle pain, unusual weakness or fatigue, feeling faint or lightheaded, decrease in the amount of urine, swelling of the ankles, hands, or feet, trouble breathing Infection--fever, chills, cough, sore throat, wounds that don't heal, pain or trouble when passing urine, general feeling of discomfort or being unwell Liver injury--right upper belly pain, loss of appetite, nausea, light-colored stool, dark yellow or brown urine, yellowing skin or eyes, unusual weakness or fatigue Low red blood cell level--unusual weakness or fatigue, dizziness, headache, trouble breathing Lung injury--shortness of breath or trouble breathing, cough, spitting up blood, chest pain, fever Stomach pain, bloody diarrhea, pale skin, unusual weakness or fatigue, decrease in the amount of urine, which may be signs of hemolytic uremic syndrome Sudden and severe headache, confusion, change in vision, seizures, which may be signs of posterior reversible encephalopathy syndrome (PRES) Unusual bruising or bleeding Side effects that usually do not require medical attention (report to your care team if they continue or are bothersome): Diarrhea Drowsiness Hair loss Nausea Pain, redness, or swelling with sores inside the  mouth or throat Vomiting This list may not describe all possible side effects. Call your doctor for medical advice about side effects. You may report side effects to FDA at 1-800-FDA-1088. Where should I keep my medication? This medication is given in a hospital or clinic. It will not be stored at home. NOTE: This sheet is a summary. It may not cover all possible information. If you have questions about this medicine, talk to your doctor, pharmacist, or health care provider.  2024 Elsevier/Gold Standard (2022-01-09 00:00:00)  

## 2023-03-31 LAB — CANCER ANTIGEN 19-9: CA 19-9: 14 U/mL (ref 0–35)

## 2023-04-02 ENCOUNTER — Telehealth: Payer: Self-pay | Admitting: Radiation Oncology

## 2023-04-02 NOTE — Telephone Encounter (Signed)
 Left message for patient to call back to schedule consult per 7/12 referral.

## 2023-04-03 ENCOUNTER — Ambulatory Visit: Payer: Commercial Managed Care - HMO

## 2023-04-06 ENCOUNTER — Other Ambulatory Visit: Payer: Self-pay | Admitting: Oncology

## 2023-04-08 NOTE — Progress Notes (Signed)
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 A diagnosis of Malignant neoplasm of pancreas, unspecified location of malignancy [facility]) was also pertinent to this visit.   HISTORY OF PRESENT ILLNESS: Kathleen Stone is a 57 y.o. female seen at the request of Dr. 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 Of note she had a pilocystic astrocytoma of the dorsal midbrain which is followed at [facility] with observation. 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 An EUS on 08/02/22 showed a 33 mm pancreatic body mass invading the celiac trunk, involving the splenoportal confluence and 2 porta hepatis nodes were noted. An FNA biopsy confirmed adenocarcinoma. She began Gemzar/Abraxane on 08/17/22 and after 6 cycles stability of the pancreatic body mass was noted by CT. She continued and additional scans in June 2024 again show stable changes, and she has completed a total of 17 cycles as of 03/29/23. HER CA 19-9 has improved at was 14 on 03/29/23, at it's highest point was 265. Since she is not a candidate for surgical resection, she's seen to consider stereotactic body radiotherapy (SBRT).    PREVIOUS RADIATION THERAPY: {EXAM; YES/NO:19492::"No"}   PAST MEDICAL HISTORY:  Past Medical History:  Diagnosis Date   Brain tumor (benign) (HCC)    Chronic lower back pain    Negative MRI of hips bilaterally, neg CT  scan of A and P; MRI of T and L spine-> very mild deg changes from T4-5 and T7-8, cervical spondylotic changes at C5-6, very mild disc bulge at L2-3 without spinal stenosis, nl ESR, ANA, RF, HIV, TSH, and B12   DJD (degenerative joint disease) of cervical spine    Family history of adverse reaction to anesthesia    "sisters PONV"   Galactorrhea of both breasts 2009   R. sided w/ benign papilloma excised in 4/09. L sided in 5/09   Gallstones    Hyperlipidemia    Hypertension    Migraine    "monthly" (11/15/2016)   Neuromuscular disorder (HCC)    left arm and side of face    Neuropathy    Thought to be 2/2 nerve compression in the C-spine   Obesity    Personal history of adenomatous colonic polyp    09/2007 - diminutive adenoma ([facility])   PONV (postoperative nausea and vomiting)    Stroke (HCC)    2021- brain tumor bx caused ? stroke-    Tobacco abuse        PAST SURGICAL HISTORY: Past Surgical History:  Procedure Laterality Date   APPENDECTOMY     BIOPSY  08/02/2022   Procedure: BIOPSY;  Surgeon: Lemar Lofty., MD;  Location: [facility];  Service: Gastroenterology;;   BRAIN BIOPSY     [phone]   BREAST SURGERY Right 12/2007   central duct excision   CHOLECYSTECTOMY N/A 12/08/2019   Procedure: LAPAROSCOPIC CHOLECYSTECTOMY;  Surgeon: Derrell Lolling,  Jed Limerick, MD;  Location[facility];  Service: General;  Laterality: N/A;   COLONOSCOPY  2009   COLONOSCOPY  09/14/2020   COLONOSCOPY W/ BIOPSIES AND POLYPECTOMY  09/2007   Dr. Leone Payor. 2 small adenomatous polyps, internal hemorrhoids-Grade 1. Rec  routine colonoscopy at age 52.   COLONOSCOPY W/ BIOPSIES AND POLYPECTOMY     ESOPHAGOGASTRODUODENOSCOPY (EGD) WITH PROPOFOL N/A 08/02/2022   Procedure: ESOPHAGOGASTRODUODENOSCOPY (EGD) WITH PROPOFOL;  Surgeon: Meridee Score Netty Starring., MD;  Location: [facility];  Service: Gastroenterology;  Laterality: N/A;   EUS N/A 08/02/2022   Procedure: FULL UPPER ENDOSCOPIC ULTRASOUND (EUS) RADIAL;   Surgeon: Meridee Score Netty Starring., MD;  Location: [facility];  Service: Gastroenterology;  Laterality: N/A;   FINE NEEDLE ASPIRATION N/A 08/02/2022   Procedure: FINE NEEDLE ASPIRATION (FNA) LINEAR;  Surgeon: Lemar Lofty., MD;  Location: [facility];  Service: Gastroenterology;  Laterality: N/A;   IR IMAGING GUIDED PORT INSERTION  08/03/2022   NASAL SEPTOPLASTY W/ TURBINOPLASTY Bilateral 04/2008   Septoplasty with bilateral inferior turbinate reduction; Dr. Ezzard Standing   POLYPECTOMY  08/02/2022   Procedure: POLYPECTOMY;  Surgeon: Meridee Score Netty Starring., MD;  Location: [facility];  Service: Gastroenterology;;   SHOULDER OPEN ROTATOR CUFF REPAIR Right     after hurting it by moving heavy objecs     FAMILY HISTORY:  Family History  Problem Relation Age of Onset   Heart failure Mother    Diabetes Mother    Lung cancer Father        dx 92s-80s   Breast cancer Maternal Aunt        d. 18   Pancreatic cancer Maternal Aunt        d. 64   Prostate cancer Maternal Uncle        d. 3   Pancreatic cancer Maternal Grandfather        d. 53s   Breast cancer Cousin        dx 27s; maternal female cousin   Stomach cancer Cousin        d. 45; maternal female cousin   Pancreatic cancer Cousin        d. 76s; maternal female cousin   Colon cancer Neg Hx    Colon polyps Neg Hx    Esophageal cancer Neg Hx    Rectal cancer Neg Hx      SOCIAL HISTORY:  reports that she has been smoking cigarettes. She has a 54 pack-year smoking history. She has never used smokeless tobacco. She reports that she does not drink alcohol and does not use drugs. The patient is married and lives in [place]. She ***   ALLERGIES: Iohexol   MEDICATIONS:  Current Outpatient Medications  Medication Sig Dispense Refill   albuterol (VENTOLIN HFA) 108 (90 Base) MCG/ACT inhaler Inhale 2 puffs into the lungs every 6 (six) hours as needed for wheezing or shortness of breath. 6.7 g 1   lidocaine (LIDODERM) 5 %  Place 1 patch onto the skin daily. Remove & Discard patch within 12 hours or as directed by MD 30 patch 0   lidocaine-prilocaine (EMLA) cream Apply 1 Application topically as needed. 30 g 0   metoprolol tartrate (LOPRESSOR) 25 MG tablet Take 1 tablet (25 mg total) by mouth 2 (two) times daily. Take 1 tablet 2 hours before your CT scan (Patient not taking: Reported on 03/29/2023) 60 tablet 2   mirtazapine (REMERON) 15 MG tablet Take 1 tablet (15 mg total) by mouth at bedtime. (Patient not taking: Reported on 03/29/2023) 30  tablet 1   nitroGLYCERIN (NITROSTAT) 0.4 MG SL tablet Place 1 tablet (0.4 mg total) under the tongue every 5 (five) minutes as needed for chest pain. (Patient not taking: Reported on 07/31/2022) 25 tablet 1   ondansetron (ZOFRAN) 4 MG tablet Take 1 tablet (4 mg total) by mouth every 8 (eight) hours as needed for nausea or vomiting. (Patient not taking: Reported on 03/29/2023) 30 tablet 1   oxyCODONE (OXY IR/ROXICODONE) 5 MG immediate release tablet Take 1 tablet (5 mg total) by mouth every 6 (six) hours as needed for severe pain. 30 tablet 0   Pancrelipase, Lip-Prot-Amyl, (PANCREAZE) 4200-14200 units CPEP Take 25,000 Units by mouth in the morning, at noon, and at bedtime. (Patient not taking: Reported on 03/29/2023) 900 capsule 0   pantoprazole (PROTONIX) 40 MG tablet Take 1 tablet (40 mg total) by mouth daily. (Patient not taking: Reported on 11/09/2022) 30 tablet 1   potassium chloride (KLOR-CON M) 10 MEQ tablet Take 2 tablets (20 mEq total) by mouth 2 (two) times daily. Take #2 tablets pm of 12/01, then #2 tablets twice daily (Patient not taking: Reported on 03/29/2023) 120 tablet 1   pregabalin (LYRICA) 25 MG capsule Take 25 mg by mouth 2 (two) times daily.     prochlorperazine (COMPAZINE) 10 MG tablet Take 1 tablet (10 mg total) by mouth every 6 (six) hours as needed for nausea or vomiting. (Patient not taking: Reported on 03/29/2023) 30 tablet 1   No current facility-administered  medications for this visit.     REVIEW OF SYSTEMS: On review of systems, the patient reports that ***      PHYSICAL EXAM:  Wt Readings from Last 3 Encounters:  03/29/23 152 lb (68.9 kg)  03/14/23 159 lb 3.2 oz (72.2 kg)  03/12/23 152 lb 8 oz (69.2 kg)   Temp Readings from Last 3 Encounters:  03/29/23 98 F (36.7 C) (Oral)  03/15/23 98.1 F (36.7 C) (Temporal)  03/14/23 98.2 F (36.8 C) (Oral)   BP Readings from Last 3 Encounters:  03/29/23 121/63  03/29/23 (!) 128/51  03/15/23 109/67   Pulse Readings from Last 3 Encounters:  03/29/23 61  03/29/23 62  03/15/23 (!) 55    /10  In general this is a well appearing African American female in no acute distress. She's alert and oriented x4 and appropriate throughout the examination. Cardiopulmonary assessment is negative for acute distress and she exhibits normal effort.     ECOG = ***  0 - Asymptomatic (Fully active, able to carry on all predisease activities without restriction)  1 - Symptomatic but completely ambulatory (Restricted in physically strenuous activity but ambulatory and able to carry out work of a light or sedentary nature. For example, light housework, office work)  2 - Symptomatic, <50% in bed during the day (Ambulatory and capable of all self care but unable to carry out any work activities. Up and about more than 50% of waking hours)  3 - Symptomatic, >50% in bed, but not bedbound (Capable of only limited self-care, confined to bed or chair 50% or more of waking hours)  4 - Bedbound (Completely disabled. Cannot carry on any self-care. Totally confined to bed or chair)  5 - Death   Santiago Glad MM, Creech RH, Tormey DC, et al. [phone]). "Toxicity and response criteria of the [facility]". Am. Evlyn Clines. Oncol. 5 (6): 649-55    LABORATORY DATA:  Lab Results  Component Value Date   WBC 7.6 03/29/2023   HGB  11.4 (L) 03/29/2023   HCT 35.3 (L) 03/29/2023   MCV 102.9 (H) 03/29/2023   PLT  211 03/29/2023   Lab Results  Component Value Date   NA 143 03/29/2023   K 3.8 03/29/2023   CL 109 03/29/2023   CO2 29 03/29/2023   Lab Results  Component Value Date   ALT 13 03/29/2023   AST 20 03/29/2023   ALKPHOS 63 03/29/2023   BILITOT 0.3 03/29/2023      RADIOGRAPHY: No results found.     IMPRESSION/PLAN: 1. 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 Dr. Mitzi Hansen discusses the patient's course and work up to date. She has done well with systemic therapy and has had stability in her tumor by imaging and improvement by tumor marker. He discusses the rationale to consider locally ablative radiation with stereotactic body radiotherapy (SBRT). We discussed the need for fiducial marker placement with Dr. Meridee Score prior to proceeding. We discussed the risks, benefits, short, and long term effects of radiotherapy, as well as the curative intent, and the patient is interested in proceeding. Dr. Mitzi Hansen discusses the delivery and logistics of radiotherapy and anticipates a course of 5 fractions of radiotherapy with IV contrast for simulation. Written consent is obtained and placed in the chart, a copy was provided to the patient. The patient will be contacted to coordinate treatment planning by our simulation department and I will reach out to Dr. Elesa Hacker scheduler. ***   In a visit lasting *** minutes, greater than 50% of the time was spent face to face discussing the patient's condition, in preparation for the discussion, and coordinating the patient's care.   The above documentation reflects my direct findings during this shared patient visit. Please see the separate note by Dr. Mitzi Hansen on this date for the remainder of the patient's plan of care.    Osker Mason, [facility]   **Disclaimer: This note was dictated with voice recognition software. Similar sounding words can inadvertently be transcribed and this note may contain transcription errors  which may not have been corrected upon publication of note.**

## 2023-04-09 ENCOUNTER — Ambulatory Visit
Admission: RE | Admit: 2023-04-09 | Discharge: 2023-04-09 | Disposition: A | Discharge status: Home / Self Care | Payer: Commercial Managed Care - HMO | Source: Ambulatory Visit | Attending: Internal Medicine | Admitting: Internal Medicine

## 2023-04-09 ENCOUNTER — Ambulatory Visit: Payer: Commercial Managed Care - HMO

## 2023-04-09 DIAGNOSIS — Z0000 Encounter for general adult medical examination without abnormal findings: Secondary | ICD-10-CM

## 2023-04-10 NOTE — Progress Notes (Signed)
GI Location of Tumor / Histology: Pancreas-Body and Neck  Kathleen Stone was noted to have a mass in the body/neck of her pancreas in November 2023.   CT AP 02/26/2023: The ill-defined low-density mass involving the pancreatic body appears similar, measuring approximately 3.7 x 2.3 cm. Once again, this mass encases the celiac axis and portal vein confluence. Severe narrowing of the portal vein confluence appears similar. 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 There is stable mild atrophy within the pancreatic tail. No progressive tumor identified.  CT AP 10/27/2022: Once again there is an ill-defined aggressive appearing low-density mass in the central portion of the pancreas with associated tail parenchymal atrophy in dilatation of the main pancreatic duct. The mass extends back to encase the celiac axis, encase and severely narrow the portal venous confluence with occlusion of the splenic vein and presence of numerous varices. On prior examinations this was felt to measure 3.0 x 5.1 cm and today mass measured 5.0 x 3.7 cm.   Biopsies of Pancreas Mass 08/02/2022    Past/Anticipated interventions by surgeon, if any:  -Not a surgical candidate.   Past/Anticipated interventions by medical oncology, if any:  [name] NP / Dr. Truett Perna 03/29/2023 -Chemo Gemzar/Abraxane started 08/17/2022. -Completed 17 cycles as of 03/29/2023. -She is tolerating treatment well. Clinical status and CA 19-9 have improved with treatment.  -At her last office visit Dr. Truett Perna discussed continuing chemotherapy versus referring for SBRT.  -She and her family have considered these options. She would like to be referred for SBRT.  -Plan to proceed with gemcitabine/Abraxane today as scheduled. -Referral placed to Dr. Mitzi Hansen.    Weight changes, if any: She has gained about 3 pounds in the last month.  Bowel/Bladder complaints, if any: Denies changes.  Nausea / Vomiting,  if any: No  Pain issues, if any: Denies pain.   Appetite: She states her appetite is starting to get a little better.   Drinking fluids well.   SAFETY ISSUES: Prior radiation? No Pacemaker/ICD? No Possible current pregnancy? Postmenopausal Is the patient on methotrexate? No  Current Complaints/Details:

## 2023-04-11 ENCOUNTER — Encounter: Payer: Self-pay | Admitting: Radiation Oncology

## 2023-04-11 ENCOUNTER — Ambulatory Visit
Admission: RE | Admit: 2023-04-11 | Discharge: 2023-04-11 | Disposition: A | Discharge status: Home / Self Care | Payer: Commercial Managed Care - HMO | Source: Ambulatory Visit | Attending: Radiation Oncology | Admitting: Radiation Oncology

## 2023-04-11 VITALS — BP 133/61 | HR 60 | Temp 97.7°F | Resp 18 | Ht 66.0 in | Wt 153.2 lb

## 2023-04-11 DIAGNOSIS — Z801 Family history of malignant neoplasm of trachea, bronchus and lung: Secondary | ICD-10-CM | POA: Insufficient documentation

## 2023-04-11 DIAGNOSIS — C259 Malignant neoplasm of pancreas, unspecified: Secondary | ICD-10-CM

## 2023-04-11 DIAGNOSIS — C251 Malignant neoplasm of body of pancreas: Secondary | ICD-10-CM | POA: Insufficient documentation

## 2023-04-11 DIAGNOSIS — Z803 Family history of malignant neoplasm of breast: Secondary | ICD-10-CM | POA: Insufficient documentation

## 2023-04-11 DIAGNOSIS — Z8673 Personal history of transient ischemic attack (TIA), and cerebral infarction without residual deficits: Secondary | ICD-10-CM | POA: Diagnosis not present

## 2023-04-11 DIAGNOSIS — F17210 Nicotine dependence, cigarettes, uncomplicated: Secondary | ICD-10-CM | POA: Insufficient documentation

## 2023-04-11 DIAGNOSIS — Z800 Family history of malignant neoplasm of digestive organs: Secondary | ICD-10-CM | POA: Insufficient documentation

## 2023-04-11 DIAGNOSIS — I10 Essential (primary) hypertension: Secondary | ICD-10-CM | POA: Insufficient documentation

## 2023-04-11 DIAGNOSIS — M1990 Unspecified osteoarthritis, unspecified site: Secondary | ICD-10-CM | POA: Insufficient documentation

## 2023-04-11 DIAGNOSIS — G8929 Other chronic pain: Secondary | ICD-10-CM | POA: Insufficient documentation

## 2023-04-11 DIAGNOSIS — E785 Hyperlipidemia, unspecified: Secondary | ICD-10-CM | POA: Insufficient documentation

## 2023-04-11 DIAGNOSIS — Z79899 Other long term (current) drug therapy: Secondary | ICD-10-CM | POA: Insufficient documentation

## 2023-04-11 DIAGNOSIS — C719 Malignant neoplasm of brain, unspecified: Secondary | ICD-10-CM

## 2023-04-11 DIAGNOSIS — Z86010 Personal history of colonic polyps: Secondary | ICD-10-CM | POA: Diagnosis not present

## 2023-04-12 ENCOUNTER — Other Ambulatory Visit: Payer: Self-pay

## 2023-04-12 ENCOUNTER — Telehealth: Payer: Self-pay

## 2023-04-12 DIAGNOSIS — C251 Malignant neoplasm of body of pancreas: Secondary | ICD-10-CM

## 2023-04-12 NOTE — Telephone Encounter (Signed)
EUS scheduled, pt instructed and medications reviewed.  Patient instructions mailed to home.  Patient to call with any questions or concerns.  

## 2023-04-12 NOTE — Telephone Encounter (Signed)
The pt has been added to 04/17/23 at [facility] with GM at 11:45 am  Dr Meridee Score per Misty Stanley this case can not run over due to anesthesia.

## 2023-04-12 NOTE — Telephone Encounter (Signed)
Call placed to [facility] endo to add case to 7/31 at 1145 am.

## 2023-04-12 NOTE — Telephone Encounter (Signed)
-----   Message from [facility] sent at 04/11/2023  5:01 PM EDT ----- ACP, Sounds good, happy to be available.  Tuleen Mandelbaum, Please offer this patient my next available EUS slot. I see that we have an opening on the 31st next week so why do not we try to add her on if she is available. If that does not work, then please reply back so that Dr. Alcide Evener can decide if he needs to give another cycle of chemo before the placement occurs. Thanks. GM ----- Message ----- From: Ronny Bacon, PA-C Sent: 04/11/2023   2:02 PM EDT To: Loretha Stapler, RN; Ladene Artist, MD; #  Hi all-  Could we get this pt in for fiducial marker placement? If it's more than a few weeks out, Dr. Truett Perna she would be willing to have another cycle of chemo.   Thanks, Jill Side

## 2023-04-15 ENCOUNTER — Encounter (HOSPITAL_COMMUNITY): Payer: Self-pay | Admitting: Gastroenterology

## 2023-04-15 NOTE — Progress Notes (Signed)
Attempted to obtain medical history via telephone, unable to reach at this time. HIPAA compliant voicemail message left requesting return call to pre surgical testing department. 

## 2023-04-16 ENCOUNTER — Encounter: Payer: Self-pay | Admitting: *Deleted

## 2023-04-16 NOTE — Progress Notes (Signed)
Dr. Truett Perna made aware that fiducial markers are planned for 7/31 w/radiation oncology hoping to start SIM/treat week of 04/29/23. Per Dr. Truett Perna: This will be OK. No need to give additional cycle of chemo at this time.

## 2023-04-17 ENCOUNTER — Encounter (HOSPITAL_COMMUNITY)
Admission: RE | Disposition: A | Discharge status: Home / Self Care | Payer: Self-pay | Source: Ambulatory Visit | Attending: Gastroenterology

## 2023-04-17 ENCOUNTER — Encounter (HOSPITAL_COMMUNITY): Payer: Self-pay | Admitting: Gastroenterology

## 2023-04-17 ENCOUNTER — Ambulatory Visit (HOSPITAL_COMMUNITY): Payer: Commercial Managed Care - HMO | Admitting: Anesthesiology

## 2023-04-17 ENCOUNTER — Ambulatory Visit (HOSPITAL_BASED_OUTPATIENT_CLINIC_OR_DEPARTMENT_OTHER): Payer: Commercial Managed Care - HMO | Admitting: Anesthesiology

## 2023-04-17 ENCOUNTER — Ambulatory Visit (HOSPITAL_COMMUNITY)
Admission: RE | Admit: 2023-04-17 | Discharge: 2023-04-17 | Disposition: A | Discharge status: Home / Self Care | Payer: Commercial Managed Care - HMO | Source: Ambulatory Visit | Attending: Gastroenterology | Admitting: Gastroenterology

## 2023-04-17 ENCOUNTER — Other Ambulatory Visit: Payer: Self-pay

## 2023-04-17 DIAGNOSIS — K449 Diaphragmatic hernia without obstruction or gangrene: Secondary | ICD-10-CM

## 2023-04-17 DIAGNOSIS — F17210 Nicotine dependence, cigarettes, uncomplicated: Secondary | ICD-10-CM

## 2023-04-17 DIAGNOSIS — C259 Malignant neoplasm of pancreas, unspecified: Secondary | ICD-10-CM | POA: Diagnosis not present

## 2023-04-17 DIAGNOSIS — C251 Malignant neoplasm of body of pancreas: Secondary | ICD-10-CM

## 2023-04-17 DIAGNOSIS — C257 Malignant neoplasm of other parts of pancreas: Secondary | ICD-10-CM | POA: Diagnosis not present

## 2023-04-17 DIAGNOSIS — I10 Essential (primary) hypertension: Secondary | ICD-10-CM | POA: Insufficient documentation

## 2023-04-17 DIAGNOSIS — Z8673 Personal history of transient ischemic attack (TIA), and cerebral infarction without residual deficits: Secondary | ICD-10-CM | POA: Diagnosis not present

## 2023-04-17 DIAGNOSIS — D132 Benign neoplasm of duodenum: Secondary | ICD-10-CM | POA: Diagnosis not present

## 2023-04-17 DIAGNOSIS — K3189 Other diseases of stomach and duodenum: Secondary | ICD-10-CM | POA: Diagnosis not present

## 2023-04-17 DIAGNOSIS — K2289 Other specified disease of esophagus: Secondary | ICD-10-CM | POA: Diagnosis not present

## 2023-04-17 HISTORY — PX: BIOPSY: SHX5522

## 2023-04-17 HISTORY — PX: ESOPHAGOGASTRODUODENOSCOPY (EGD) WITH PROPOFOL: SHX5813

## 2023-04-17 HISTORY — PX: FIDUCIAL MARKER PLACEMENT: SHX6858

## 2023-04-17 HISTORY — PX: EUS: SHX5427

## 2023-04-17 SURGERY — INSERTION, FIDUCIAL MARKERS
Anesthesia: Monitor Anesthesia Care

## 2023-04-17 MED ORDER — ONDANSETRON HCL 4 MG/2ML IJ SOLN
INTRAMUSCULAR | Status: DC | PRN
  Administered 2023-04-17: 4 mg via INTRAVENOUS

## 2023-04-17 MED ORDER — PROPOFOL 500 MG/50ML IV EMUL
INTRAVENOUS | Status: DC | PRN
  Administered 2023-04-17: 60 mg via INTRAVENOUS

## 2023-04-17 MED ORDER — MIDAZOLAM HCL 5 MG/5ML IJ SOLN
INTRAMUSCULAR | Status: DC | PRN
  Administered 2023-04-17: 2 mg via INTRAVENOUS

## 2023-04-17 MED ORDER — SODIUM CHLORIDE 0.9 % IV SOLN: INTRAVENOUS | Status: DC

## 2023-04-17 MED ORDER — MIDAZOLAM HCL 2 MG/2ML IJ SOLN
INTRAMUSCULAR | Status: AC
  Filled 2023-04-17: qty 2

## 2023-04-17 MED ORDER — LIDOCAINE HCL (PF) 2 % IJ SOLN
INTRAMUSCULAR | Status: DC | PRN
Start: 2023-04-17 — End: 2023-04-17
  Administered 2023-04-17: 100 mg via INTRADERMAL

## 2023-04-17 MED ORDER — DEXAMETHASONE SODIUM PHOSPHATE 10 MG/ML IJ SOLN
INTRAMUSCULAR | Status: DC | PRN
  Administered 2023-04-17: 4 mg via INTRAVENOUS

## 2023-04-17 MED ORDER — PHENYLEPHRINE HCL (PRESSORS) 10 MG/ML IV SOLN
INTRAVENOUS | Status: DC | PRN
  Administered 2023-04-17: 100 ug via INTRAVENOUS

## 2023-04-17 MED ORDER — LACTATED RINGERS IV SOLN: INTRAVENOUS | Status: DC

## 2023-04-17 MED ORDER — PROPOFOL 10 MG/ML IV BOLUS
INTRAVENOUS | Status: DC | PRN
  Administered 2023-04-17: 110 ug/kg/min via INTRAVENOUS

## 2023-04-17 NOTE — Anesthesia Preprocedure Evaluation (Addendum)
Anesthesia Evaluation  Patient identified by MRN, date of birth, ID band Patient awake    Reviewed: Allergy & Precautions, NPO status , Patient's Chart, lab work & pertinent test results  History of Anesthesia Complications (+) PONV and history of anesthetic complications  Airway Mallampati: II  TM Distance: >3 FB Neck ROM: Full    Dental no notable dental hx.    Pulmonary Current Smoker   Pulmonary exam normal        Cardiovascular hypertension,  Rhythm:Regular Rate:Normal     Neuro/Psych  Headaches  Anxiety Depression    CVA, Residual Symptoms    GI/Hepatic negative GI ROS, Neg liver ROS,,,Pancreatic cancer   Endo/Other    Renal/GU negative Renal ROS  negative genitourinary   Musculoskeletal  (+) Arthritis , Osteoarthritis,    Abdominal Normal abdominal exam  (+)   Peds  Hematology negative hematology ROS (+)   Anesthesia Other Findings   Reproductive/Obstetrics                             Anesthesia Physical Anesthesia Plan  ASA: 3  Anesthesia Plan: MAC   Post-op Pain Management:    Induction: Intravenous  PONV Risk Score and Plan: 2 and Propofol infusion and Treatment may vary due to age or medical condition  Airway Management Planned: Simple Face Mask and Nasal Cannula  Additional Equipment: None  Intra-op Plan:   Post-operative Plan:   Informed Consent: I have reviewed the patients History and Physical, chart, labs and discussed the procedure including the risks, benefits and alternatives for the proposed anesthesia with the patient or authorized representative who has indicated his/her understanding and acceptance.     Dental advisory given  Plan Discussed with:   Anesthesia Plan Comments:        Anesthesia Quick Evaluation

## 2023-04-17 NOTE — Op Note (Signed)
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 Domenic Moras, Katie Masters Medicines:                Monitored Anesthesia Care Complications:            No immediate complications. Estimated Blood Loss:     Estimated blood loss was minimal. Procedure:                Pre-Anesthesia Assessment:                           - Prior to the procedure, a History and Physical                            was performed, and patient medications and                            allergies were reviewed. The patient's tolerance of                            previous anesthesia was also reviewed. The risks                            and benefits of the procedure and the sedation                            options and risks were discussed with the patient.                            All questions were answered, and informed consent                            was obtained. Prior Anticoagulants: The patient has                            taken no anticoagulant or antiplatelet agents. ASA                            Grade Assessment: II - A patient with mild systemic  disease. After reviewing the risks and benefits,                            the patient was deemed in satisfactory condition to                            undergo the procedure.                           After obtaining informed consent, the endoscope was                            passed under direct vision.  Throughout the                            procedure, the patient's blood pressure, pulse, and                            oxygen saturations were monitored continuously. The                            [id number]-[id number] ([id number]) Olympus endoscope was introduced                            through the mouth, and advanced to the second part                            of duodenum. The [id number] ([id number]) Olympus linear                            ultrasound scope was introduced through the mouth,                            and advanced to the stomach for ultrasound                            examination. The upper EUS was accomplished without                            difficulty. The patient tolerated the procedure. Scope In: Scope Out: Findings:      ENDOSCOPIC FINDING: :      No gross lesions were noted in the entire esophagus.      The Z-line was regular and was found 37 cm from the incisors.      A 2 cm hiatal hernia was present.      No gross lesions were noted in the entire examined stomach.      Diffuse moderate mucosal changes characterized by congestion, scalloping       and altered texture were found in the duodenal bulb, in the first       portion of the duodenum and in the second portion of the duodenum.       Biopsies were taken with a cold forceps for histology.      ENDOSONOGRAPHIC FINDING: :      An irregular mass was identified in the genu  of the pancreas and       pancreatic body. The mass was hypoechoic. The endosonographic borders       were well-defined. There was sonographic evidence suggesting invasion       into the superior mesenteric artery (manifested by abutment) and the       celiac trunk (manifested by encasement). The remainder of the pancreas       was examined. The endosonographic appearance of parenchyma and the       upstream pancreatic duct indicated duct dilation and parenchymal       atrophy. Fiducial marker placement was performed. Once the target lesion        In the pancreas was identified a preloaded marker in a 22 gauge needle       was then deployed in the lesion. This was repeated for a total of four       markers.      Endosonographic imaging in the visualized portion of the liver showed no       mass. Impression:               EGD impression:                           - No gross lesions in the entire esophagus. Z-line                            regular, 37 cm from the incisors.                           - 2 cm hiatal hernia.                           - No gross lesions in the entire stomach.                           - Mucosal changes in the duodenum. Biopsied.                           EUS impression:                           - A mass was identified in the genu of the pancreas                            and pancreatic body. A tissue diagnosis was                            obtained prior to this exam. This is consistent                            with adenocarcinoma. Fiducial markers were deployed. Moderate Sedation:      Not Applicable - Patient had care per Anesthesia. Recommendation:           - The patient will be observed post-procedure,                            until all discharge criteria are met.                           -  Discharge patient to home.                           - Patient has a contact number available for                            emergencies. The signs and symptoms of potential                            delayed complications were discussed with the                            patient. Return to normal activities tomorrow.                            Written discharge instructions were provided to the                            patient.                           - Low fat diet.                           - Await path results.                           - Patient may move forward with radiation oncology                            simulation in the next steps of moving forward with                             SBRT.                           - Monitor for signs/symptoms of bleeding,                            perforation, and infection. If issues please call                            our number to get further assistance as needed.                           - The findings and recommendations were discussed                            with the patient.                           - The findings and recommendations were discussed                            with the patient's family. Procedure Code(s):        --- Professional ---  82956, Esophagogastroduodenoscopy, flexible,                            transoral; with transendoscopic ultrasound-guided                            transmural injection of diagnostic or therapeutic                            substance(s) (eg, anesthetic, neurolytic agent) or                            fiducial marker(s) (includes endoscopic ultrasound                            examination of the esophagus, stomach, and either                            the duodenum or a surgically altered stomach where                            the jejunum is examined distal to the anastomosis)                           43239, 59, Esophagogastroduodenoscopy, flexible,                            transoral; with biopsy, single or multiple Diagnosis Code(s):        --- Professional ---                           K44.9, Diaphragmatic hernia without obstruction or                            gangrene                           K31.89, Other diseases of stomach and duodenum                           K86.89, Other specified diseases of pancreas                           C25.9, Malignant neoplasm of pancreas, unspecified CPT copyright 2022 American [employer] Association. All rights reserved. The codes documented in this report are preliminary and upon coder review may  be revised to meet current compliance requirements. Corliss Parish, MD 04/17/2023 12:44:30 PM Number of  Addenda: 0

## 2023-04-17 NOTE — Transfer of Care (Signed)
Immediate Anesthesia Transfer of Care Note  Patient: TULSA BRYE  Procedure(s) Performed: FIDUCIAL MARKER PLACEMENT UPPER ENDOSCOPIC ULTRASOUND (EUS) RADIAL ESOPHAGOGASTRODUODENOSCOPY (EGD) WITH PROPOFOL BIOPSY  Patient Location: PACU and Endoscopy Unit  Anesthesia Type:MAC  Level of Consciousness: awake, alert , oriented, and patient cooperative  Airway & Oxygen Therapy: Patient Spontanous Breathing and Patient connected to face mask oxygen  Post-op Assessment: Report given to RN, Post -op Vital signs reviewed and unstable, Anesthesiologist notified, and Patient moving all extremities  Post vital signs: Reviewed and stable  Last Vitals:  Vitals Value Taken Time  BP    Temp    Pulse 56 04/17/23 1240  Resp 16 04/17/23 1240  SpO2 100 % 04/17/23 1240  Vitals shown include unfiled device data.  Last Pain:  Vitals:   04/17/23 1021  TempSrc: Temporal  PainSc: 0-No pain         Complications: No notable events documented.

## 2023-04-17 NOTE — Discharge Instructions (Signed)

## 2023-04-17 NOTE — Anesthesia Postprocedure Evaluation (Signed)
Anesthesia Post Note  Patient: Kathleen Stone  Procedure(s) Performed: FIDUCIAL MARKER PLACEMENT UPPER ENDOSCOPIC ULTRASOUND (EUS) RADIAL ESOPHAGOGASTRODUODENOSCOPY (EGD) WITH PROPOFOL BIOPSY     Patient location during evaluation: PACU Anesthesia Type: MAC Level of consciousness: awake and alert Pain management: pain level controlled Vital Signs Assessment: post-procedure vital signs reviewed and stable Respiratory status: spontaneous breathing, nonlabored ventilation, respiratory function stable and patient connected to nasal cannula oxygen Cardiovascular status: stable and blood pressure returned to baseline Postop Assessment: no apparent nausea or vomiting Anesthetic complications: no   No notable events documented.  Last Vitals:  Vitals:   04/17/23 1250 04/17/23 1300  BP: (!) 110/47 (!) 126/55  Pulse: 63 60  Resp: 17 15  Temp:    SpO2: 98% 96%    Last Pain:  Vitals:   04/17/23 1300  TempSrc:   PainSc: 0-No pain                 Kathleen Stone

## 2023-04-17 NOTE — H&P (Signed)
GASTROENTEROLOGY PROCEDURE H&P NOTE   Primary Care Physician: Rudene Christians, DO  HPI: Kathleen Stone is a 57 y.o. female who presents for EGD/EUS to evaluate for fiducial marker placement in setting of unresectable pancreas cancer.  Past Medical History:  Diagnosis Date   Brain tumor (benign) (HCC)    Chronic lower back pain    Negative MRI of hips bilaterally, neg CT scan of A and P; MRI of T and L spine-> very mild deg changes from T4-5 and T7-8, cervical spondylotic changes at C5-6, very mild disc bulge at L2-3 without spinal stenosis, nl ESR, ANA, RF, HIV, TSH, and B12   DJD (degenerative joint disease) of cervical spine    Family history of adverse reaction to anesthesia    "sisters PONV"   Galactorrhea of both breasts 2009   R. sided w/ benign papilloma excised in 4/09. L sided in 5/09   Gallstones    Hyperlipidemia    Hypertension    Migraine    "monthly" (11/15/2016)   Neuromuscular disorder (HCC)    left arm and side of face    Neuropathy    Thought to be 2/2 nerve compression in the C-spine   Obesity    Personal history of adenomatous colonic polyp    09/2007 - diminutive adenoma ([facility])   PONV (postoperative nausea and vomiting)    Stroke (HCC)    2021- brain tumor bx caused ? stroke-    Tobacco abuse    Past Surgical History:  Procedure Laterality Date   APPENDECTOMY     BIOPSY  08/02/2022   Procedure: BIOPSY;  Surgeon: Lemar Lofty., MD;  Location: [facility];  Service: Gastroenterology;;   BRAIN BIOPSY     [id number]   BREAST SURGERY Right 12/2007   central duct excision   CHOLECYSTECTOMY N/A 12/08/2019   Procedure: LAPAROSCOPIC CHOLECYSTECTOMY;  Surgeon: Axel Filler, MD;  Location: [facility] OR;  Service: General;  Laterality: N/A;   COLONOSCOPY  2009   COLONOSCOPY  09/14/2020   COLONOSCOPY W/ BIOPSIES AND POLYPECTOMY  09/2007   Dr. Leone Payor. 2 small adenomatous polyps, internal hemorrhoids-Grade 1. Rec  routine colonoscopy at age 41.    COLONOSCOPY W/ BIOPSIES AND POLYPECTOMY     ESOPHAGOGASTRODUODENOSCOPY (EGD) WITH PROPOFOL N/A 08/02/2022   Procedure: ESOPHAGOGASTRODUODENOSCOPY (EGD) WITH PROPOFOL;  Surgeon: Meridee Score Netty Starring., MD;  Location: [facility];  Service: Gastroenterology;  Laterality: N/A;   EUS N/A 08/02/2022   Procedure: FULL UPPER ENDOSCOPIC ULTRASOUND (EUS) RADIAL;  Surgeon: Meridee Score Netty Starring., MD;  Location: [facility];  Service: Gastroenterology;  Laterality: N/A;   FINE NEEDLE ASPIRATION N/A 08/02/2022   Procedure: FINE NEEDLE ASPIRATION (FNA) LINEAR;  Surgeon: Lemar Lofty., MD;  Location: [facility];  Service: Gastroenterology;  Laterality: N/A;   IR IMAGING GUIDED PORT INSERTION  08/03/2022   NASAL SEPTOPLASTY W/ TURBINOPLASTY Bilateral 04/2008   Septoplasty with bilateral inferior turbinate reduction; Dr. Ezzard Standing   POLYPECTOMY  08/02/2022   Procedure: POLYPECTOMY;  Surgeon: Lemar Lofty., MD;  Location: [facility];  Service: Gastroenterology;;   SHOULDER OPEN ROTATOR CUFF REPAIR Right     after hurting it by moving heavy objecs   Current Facility-Administered Medications  Medication Dose Route Frequency Provider Last Rate Last Admin   0.9 %  sodium chloride infusion   Intravenous Continuous Mansouraty, Netty Starring., MD       lactated ringers infusion   Intravenous Continuous Mansouraty, Netty Starring., MD   Stopped at 04/17/23 1250   Current Outpatient  Medications  Medication Sig Dispense Refill   albuterol (VENTOLIN HFA) 108 (90 Base) MCG/ACT inhaler Inhale 2 puffs into the lungs every 6 (six) hours as needed for wheezing or shortness of breath. 6.7 g 1   lidocaine-prilocaine (EMLA) cream Apply 1 Application topically as needed. 30 g 0   nitroGLYCERIN (NITROSTAT) 0.4 MG SL tablet Place 1 tablet (0.4 mg total) under the tongue every 5 (five) minutes as needed for chest pain. 25 tablet 1   pantoprazole (PROTONIX) 40 MG tablet Take 1 tablet (40 mg total) by mouth daily.  (Patient not taking: Reported on 11/09/2022) 30 tablet 1    Current Facility-Administered Medications:    0.9 %  sodium chloride infusion, , Intravenous, Continuous, Mansouraty, Netty Starring., MD   lactated ringers infusion, , Intravenous, Continuous, Mansouraty, Netty Starring., MD, Stopped at 04/17/23 1250  Current Outpatient Medications:    albuterol (VENTOLIN HFA) 108 (90 Base) MCG/ACT inhaler, Inhale 2 puffs into the lungs every 6 (six) hours as needed for wheezing or shortness of breath., Disp: 6.7 g, Rfl: 1   lidocaine-prilocaine (EMLA) cream, Apply 1 Application topically as needed., Disp: 30 g, Rfl: 0   nitroGLYCERIN (NITROSTAT) 0.4 MG SL tablet, Place 1 tablet (0.4 mg total) under the tongue every 5 (five) minutes as needed for chest pain., Disp: 25 tablet, Rfl: 1   pantoprazole (PROTONIX) 40 MG tablet, Take 1 tablet (40 mg total) by mouth daily. (Patient not taking: Reported on 11/09/2022), Disp: 30 tablet, Rfl: 1 Allergies  Allergen Reactions   Iohexol Hives, Itching and Other (See Comments)    Benadryl 50mg  PO one hour before iodinated contrast studies.  Jkl, per pt she only has itching when she urinates, not with injection 08/01/22 [facility]   Family History  Problem Relation Age of Onset   Heart failure Mother    Diabetes Mother    Lung cancer Father        dx 54s-80s   Breast cancer Maternal Aunt        d. 51   Pancreatic cancer Maternal Aunt        d. 59   Prostate cancer Maternal Uncle        d. 39   Pancreatic cancer Maternal Grandfather        d. 96s   Breast cancer Cousin        dx 60s; maternal female cousin   Stomach cancer Cousin        d. 72; maternal female cousin   Pancreatic cancer Cousin        d. 60s; maternal female cousin   Colon cancer Neg Hx    Colon polyps Neg Hx    Esophageal cancer Neg Hx    Rectal cancer Neg Hx    Social History   Socioeconomic History   Marital status: Married    Spouse name: Not on file   Number of children: Not on file   Years of  education: Not on file   Highest education level: Not on file  Occupational History   Occupation: unemployed - applied for disability  Tobacco Use   Smoking status: Every Day    Current packs/day: 1.50    Average packs/day: 1.5 packs/day for 36.0 years (54.0 ttl pk-yrs)    Types: Cigarettes   Smokeless tobacco: Never  Vaping Use   Vaping status: Former  Substance and Sexual Activity   Alcohol use: No    Alcohol/week: 0.0 standard drinks of alcohol   Drug use: No  Sexual activity: Yes    Partners: Female  Other Topics Concern   Not on file  Social History Narrative   Domestic Partner: female has emphysema   Smoked 2 packs/day for 20+ years, down to 1/2ppd now, willing to try to quit    Alcohol use-no   Drug use-no   Regular exercise-no   Social Determinants of Health   Financial Resource Strain: Medium Risk (08/21/2022)   Overall Financial Resource Strain (CARDIA)    Difficulty of Paying Living Expenses: Somewhat hard  Food Insecurity: No Food Insecurity (07/31/2022)   Hunger Vital Sign    Worried About Running Out of Food in the Last Year: Never true    Ran Out of Food in the Last Year: Never true  Transportation Needs: No Transportation Needs (07/31/2022)   PRAPARE - Administrator, Civil Service (Medical): No    Lack of Transportation (Non-Medical): No  Physical Activity: Not on file  Stress: Not on file  Social Connections: Not on file  Intimate Partner Violence: Not At Risk (07/31/2022)   Humiliation, Afraid, Rape, and Kick questionnaire    Fear of Current or Ex-Partner: No    Emotionally Abused: No    Physically Abused: No    Sexually Abused: No    Physical Exam: Today's Vitals   04/17/23 1021 04/17/23 1240 04/17/23 1250 04/17/23 1300  BP: 132/63 (!) 144/63 (!) 110/47 (!) 126/55  Pulse: 72 64 63 60  Resp: 15 18 17 15   Temp: (!) 97.4 F (36.3 C) (!) 97.3 F (36.3 C)    TempSrc: Temporal Temporal    SpO2: 99% 100% 98% 96%  Weight: 70.3 kg      Height: 5\' 6"  (1.676 m)     PainSc: 0-No pain 0-No pain 0-No pain 0-No pain   Body mass index is 25.02 kg/m. GEN: NAD EYE: Sclerae anicteric ENT: MMM CV: Non-tachycardic GI: Soft, NT/ND NEURO:  Alert & Oriented x 3  Lab Results: No results for input(s): "WBC", "HGB", "HCT", "PLT" in the last 72 hours. BMET No results for input(s): "NA", "K", "CL", "CO2", "GLUCOSE", "BUN", "CREATININE", "CALCIUM" in the last 72 hours. LFT No results for input(s): "PROT", "ALBUMIN", "AST", "ALT", "ALKPHOS", "BILITOT", "BILIDIR", "IBILI" in the last 72 hours. PT/INR No results for input(s): "LABPROT", "INR" in the last 72 hours.   Impression / Plan: This is a 57 y.o.female who presents for EGD/EUS to evaluate for fiducial marker placement in setting of unresectable pancreas cancer.  The risks of an EUS including intestinal perforation, bleeding, infection, aspiration, and medication effects were discussed as was the possibility it may not give a definitive diagnosis if a biopsy is performed.  When a biopsy of the pancreas is done as part of the EUS, there is an additional risk of pancreatitis at the rate of about 1-2%.  It was explained that procedure related pancreatitis is typically mild, although it can be severe and even life threatening, which is why we do not perform random pancreatic biopsies and only biopsy a lesion/area we feel is concerning enough to warrant the risk.   The risks and benefits of endoscopic evaluation/treatment were discussed with the patient and/or family; these include but are not limited to the risk of perforation, infection, bleeding, missed lesions, lack of diagnosis, severe illness requiring hospitalization, as well as anesthesia and sedation related illnesses.  The patient's history has been reviewed, patient examined, no change in status, and deemed stable for procedure.  The patient and/or family is agreeable  to proceed.    Corliss Parish, MD [place]  [facility] Advanced Endoscopy Office # [phone]

## 2023-04-18 ENCOUNTER — Encounter (HOSPITAL_COMMUNITY): Payer: Self-pay | Admitting: Gastroenterology

## 2023-04-19 ENCOUNTER — Encounter: Payer: Self-pay | Admitting: Gastroenterology

## 2023-04-24 ENCOUNTER — Other Ambulatory Visit: Payer: Self-pay

## 2023-04-25 NOTE — Progress Notes (Signed)
Has armband been applied?    Does patient have an allergy to IV contrast dye?:  Yes patient has allergy to lohexol   Has patient ever received premedication for IV contrast dye?:  Yes, Benadryl 50mg  PO one hour before iodinated contrast studies   Does patient take metformin?: No  If patient does take metformin when was the last dose: NA  Date of lab work: 03/29/2023 BUN: 16 CR: 0.77 eGfr: >60  IV site: Chest Port  Has IV site been added to flowsheet?  Yes  BP 128/69 (BP Location: Left Arm, Patient Position: Sitting, Cuff Size: Normal)   Pulse 61   Temp 97.9 F (36.6 C)   Resp 20   Ht 5\' 6"  (1.676 m)   Wt 159 lb 12.8 oz (72.5 kg)   LMP 02/07/2016 (Exact Date)   SpO2 100%   BMI 25.79 kg/m

## 2023-04-26 ENCOUNTER — Ambulatory Visit
Admission: RE | Admit: 2023-04-26 | Payer: Commercial Managed Care - HMO | Source: Ambulatory Visit | Admitting: Radiation Oncology

## 2023-04-26 ENCOUNTER — Ambulatory Visit
Admission: RE | Admit: 2023-04-26 | Discharge: 2023-04-26 | Disposition: A | Discharge status: Home / Self Care | Payer: Commercial Managed Care - HMO | Source: Ambulatory Visit | Attending: Radiation Oncology | Admitting: Radiation Oncology

## 2023-04-26 ENCOUNTER — Other Ambulatory Visit: Payer: Self-pay

## 2023-04-26 VITALS — BP 128/69 | HR 61 | Temp 97.9°F | Resp 20 | Ht 66.0 in | Wt 159.8 lb

## 2023-04-26 DIAGNOSIS — C251 Malignant neoplasm of body of pancreas: Secondary | ICD-10-CM | POA: Insufficient documentation

## 2023-04-26 DIAGNOSIS — K8689 Other specified diseases of pancreas: Secondary | ICD-10-CM

## 2023-04-26 MED ORDER — SODIUM CHLORIDE 0.9% FLUSH
10.0000 mL | Freq: Once | INTRAVENOUS | Status: AC
  Administered 2023-04-26: 10 mL via INTRAVENOUS

## 2023-04-26 MED ORDER — DIPHENHYDRAMINE HCL 25 MG PO CAPS
50.0000 mg | ORAL_CAPSULE | Freq: Once | ORAL | Status: AC
  Administered 2023-04-26: 50 mg via ORAL
  Filled 2023-04-26: qty 2

## 2023-04-26 MED ORDER — HEPARIN SOD (PORK) LOCK FLUSH 100 UNIT/ML IV SOLN
500.0000 [IU] | Freq: Once | INTRAVENOUS | Status: AC
  Administered 2023-04-26: 500 [IU] via INTRAVENOUS

## 2023-05-02 ENCOUNTER — Inpatient Hospital Stay: Payer: Commercial Managed Care - HMO

## 2023-05-02 ENCOUNTER — Inpatient Hospital Stay (HOSPITAL_BASED_OUTPATIENT_CLINIC_OR_DEPARTMENT_OTHER): Payer: Commercial Managed Care - HMO | Admitting: Oncology

## 2023-05-02 ENCOUNTER — Inpatient Hospital Stay: Payer: Commercial Managed Care - HMO | Attending: Oncology

## 2023-05-02 VITALS — BP 130/68 | HR 65 | Temp 98.2°F | Resp 18 | Ht 66.0 in | Wt 159.0 lb

## 2023-05-02 DIAGNOSIS — Z801 Family history of malignant neoplasm of trachea, bronchus and lung: Secondary | ICD-10-CM | POA: Diagnosis not present

## 2023-05-02 DIAGNOSIS — C251 Malignant neoplasm of body of pancreas: Secondary | ICD-10-CM | POA: Diagnosis present

## 2023-05-02 DIAGNOSIS — Z452 Encounter for adjustment and management of vascular access device: Secondary | ICD-10-CM | POA: Insufficient documentation

## 2023-05-02 DIAGNOSIS — Z9221 Personal history of antineoplastic chemotherapy: Secondary | ICD-10-CM | POA: Insufficient documentation

## 2023-05-02 DIAGNOSIS — Z95828 Presence of other vascular implants and grafts: Secondary | ICD-10-CM

## 2023-05-02 DIAGNOSIS — Z803 Family history of malignant neoplasm of breast: Secondary | ICD-10-CM | POA: Insufficient documentation

## 2023-05-02 DIAGNOSIS — Z800 Family history of malignant neoplasm of digestive organs: Secondary | ICD-10-CM | POA: Insufficient documentation

## 2023-05-02 DIAGNOSIS — Z8042 Family history of malignant neoplasm of prostate: Secondary | ICD-10-CM | POA: Insufficient documentation

## 2023-05-02 LAB — CMP (CANCER CENTER ONLY)
ALT: 11 U/L (ref 0–44)
AST: 18 U/L (ref 15–41)
Albumin: 3.2 g/dL — ABNORMAL LOW (ref 3.5–5.0)
Alkaline Phosphatase: 79 U/L (ref 38–126)
Anion gap: 6 (ref 5–15)
BUN: 12 mg/dL (ref 6–20)
CO2: 28 mmol/L (ref 22–32)
Calcium: 9 mg/dL (ref 8.9–10.3)
Chloride: 107 mmol/L (ref 98–111)
Creatinine: 0.76 mg/dL (ref 0.44–1.00)
GFR, Estimated: >60 mL/min (ref >60)
Glucose, Bld: 88 mg/dL (ref 70–99)
Potassium: 3.6 mmol/L (ref 3.5–5.1)
Sodium: 141 mmol/L (ref 135–145)
Total Bilirubin: 0.4 mg/dL (ref 0.3–1.2)
Total Protein: 6.3 g/dL — ABNORMAL LOW (ref 6.5–8.1)

## 2023-05-02 LAB — CBC WITH DIFFERENTIAL (CANCER CENTER ONLY)
Abs Immature Granulocytes: 0.04 10*3/uL (ref 0.00–0.07)
Basophils Absolute: 0 10*3/uL (ref 0.0–0.1)
Basophils Relative: 0 %
Eosinophils Absolute: 0.1 10*3/uL (ref 0.0–0.5)
Eosinophils Relative: 1 %
HCT: 36.2 % (ref 36.0–46.0)
Hemoglobin: 12.2 g/dL (ref 12.0–15.0)
Immature Granulocytes: 0 %
Lymphocytes Relative: 20 %
Lymphs Abs: 1.9 10*3/uL (ref 0.7–4.0)
MCH: 32.8 pg (ref 26.0–34.0)
MCHC: 33.7 g/dL (ref 30.0–36.0)
MCV: 97.3 fL (ref 80.0–100.0)
Monocytes Absolute: 0.7 10*3/uL (ref 0.1–1.0)
Monocytes Relative: 7 %
Neutro Abs: 6.8 10*3/uL (ref 1.7–7.7)
Neutrophils Relative %: 72 %
Platelet Count: 168 10*3/uL (ref 150–400)
RBC: 3.72 MIL/uL — ABNORMAL LOW (ref 3.87–5.11)
RDW: 15.3 % (ref 11.5–15.5)
WBC Count: 9.6 10*3/uL (ref 4.0–10.5)
nRBC: 0 % (ref 0.0–0.2)

## 2023-05-02 MED ORDER — HEPARIN SOD (PORK) LOCK FLUSH 100 UNIT/ML IV SOLN
500.0000 [IU] | Freq: Once | INTRAVENOUS | Status: AC
  Administered 2023-05-02: 500 [IU] via INTRAVENOUS

## 2023-05-02 MED ORDER — SODIUM CHLORIDE 0.9% FLUSH
10.0000 mL | Freq: Once | INTRAVENOUS | Status: AC
  Administered 2023-05-02: 10 mL via INTRAVENOUS

## 2023-05-02 NOTE — Patient Instructions (Signed)

## 2023-05-02 NOTE — Progress Notes (Signed)
[facility] OFFICE PROGRESS NOTE   Diagnosis: Pancreas cancer  INTERVAL HISTORY:   Kathleen Stone was last treated with gemcitabine/Abraxane on 03/29/2023.  She underwent fiducial marker placement on 04/17/2023.  She underwent SBRT CT simulation on 04/26/2023.  She is scheduled to begin SBRT on 05/07/2023.  She reports improvement in her appetite.  She is now eating a regular diet in addition to Ensure.  No new complaint.   Objective:  Vital signs in last 24 hours:  Blood pressure 130/68, pulse 65, temperature 98.2 F (36.8 C), temperature source Oral, resp. rate 18, height 5\' 6"  (1.676 m), weight 159 lb (72.1 kg), last menstrual period 02/07/2016, SpO2 100%.     Resp: Lungs clear bilaterally Cardio: Regular rate and rhythm GI: Mild tenderness in the left subcostal region, no mass, no hepatosplenomegaly Vascular: No leg edema   Lab Results:  Lab Results  Component Value Date   WBC 9.6 05/02/2023   HGB 12.2 05/02/2023   HCT 36.2 05/02/2023   MCV 97.3 05/02/2023   PLT 168 05/02/2023   NEUTROABS 6.8 05/02/2023    CMP  Lab Results  Component Value Date   NA 141 05/02/2023   K 3.6 05/02/2023   CL 107 05/02/2023   CO2 28 05/02/2023   GLUCOSE 88 05/02/2023   BUN 12 05/02/2023   CREATININE 0.76 05/02/2023   CALCIUM 9.0 05/02/2023   PROT 6.3 (L) 05/02/2023   ALBUMIN 3.2 (L) 05/02/2023   AST 18 05/02/2023   ALT 11 05/02/2023   ALKPHOS 79 05/02/2023   BILITOT 0.4 05/02/2023   GFRNONAA >60 05/02/2023   GFRAA >60 02/29/2020    Lab Results  Component Value Date   CEA1 27.4 (H) 07/31/2022   XLK440 14 03/29/2023     Medications: I have reviewed the patient's current medications.   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 CT abdomen/pelvis 07/30/2022-body/neck pancreas mass with peripheral pancreatic atrophy and duct dilation, suspected encasement of the celiac axis, uterine fibroids MRI abdomen 07/30/2022-3.7 x 4.7 cm pancreas body/tail mass with encasement  of the proximal celiac artery, occlusion with collateralization at the confluence of the SMV and portal vein, no evidence of metastatic disease CT chest 08/01/2022-no intrathoracic metastases Elevated CA 19-9 EUS 08/02/2022-gastric polyp, gastric erosions, 32 x 33 mm pancreas body mass, invasion of the celiac trunk, invasion of the splenoportal confluence, no SMA invasion, 2 malignant appearing nodes in the porta hepatis,T4N1 by endoscopic criteria, FNA biopsy-adenocarcinoma 08/17/2022 Guardant360-tumor mutation burden 0.96; MSI high not detected; variant of uncertain clinical significance NTRK3 T93M Cycle 1 gemcitabine/Abraxane 08/17/2022 Cycle 2 gemcitabine/Abraxane 08/31/2022 Cycle 3 gemcitabine/Abraxane 09/14/2022 Cycle 4 gemcitabine/Abraxane 09/28/2022 Cycle 5 gemcitabine/Abraxane 10/12/2022 Cycle 6 gemcitabine/Abraxane 10/26/2022 CT abdomen/pelvis 10/27/2022-stable pancreas body mass with encasement of the celiac axis and portal vein with occlusion of the splenic vein and varices.  Stable porta hepatis lymph nodes. Cycle 7 gemcitabine/Abraxane 11/09/2022 Cycle 8 gemcitabine/Abraxane 11/23/2022 Cycle 9 gemcitabine/Abraxane 12/07/2022 Cycle 10 gemcitabine/Abraxane 12/21/2022 Cycle 11 gemcitabine/Abraxane 01/04/2023 Cycle 12 gemcitabine/Abraxane 01/18/2023 Cycle 13 gemcitabine/Abraxane 02/01/2023 Cycle 14 gemcitabine/Abraxane 02/15/2023 CT abdomen/pelvis 02/26/2023-stable appearance of pancreas body mass with vascular encasement and chronic occlusion of the splenic vein, no evidence of metastatic disease Cycle 15 gemcitabine/Abraxane 03/01/2023 Cycle 16 gemcitabine/Abraxane 03/15/2023 Cycle 17 Gemcitabine/Abraxane 03/29/2023 EUS pancreas fiducial marker placement 04/17/2023    2.  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  Anorexia/weight loss secondary to #1 4.  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  Abdominal pain 6.  History  of colon polyps 7.  Family history of multiple cancers Pancreas cancer maternal aunt, maternal cousin, maternal grandfather Breast cancer-maternal aunt, maternal cousin Prostate cancer-maternal uncle Lung cancer-father 8.  Hypertension 9.  Postmenopausal vaginal bleeding 10.  H. pylori 08/02/2022-amoxicillin and Flagyl discontinued 08/21/2022 due to GI symptoms 11.  Syncope event 03/07/2023    Disposition: Kathleen Stone has locally advanced pancreas cancer.  She was last treated with gemcitabine/Abraxane 03/29/2023.  Her performance status appears improved.  She is scheduled to begin SBRT to the pancreas next week.  A Port-A-Cath remains in place.  She will return for an office visit and Port-A-Cath flush in approximately 8 weeks.  We will check the CA 19-9 with the following Port-A-Cath flush.  Thornton Papas, MD  05/02/2023  10:11 AM

## 2023-05-03 DIAGNOSIS — C251 Malignant neoplasm of body of pancreas: Secondary | ICD-10-CM | POA: Diagnosis not present

## 2023-05-03 LAB — CANCER ANTIGEN 19-9: CA 19-9: 14 U/mL (ref 0–35)

## 2023-05-04 ENCOUNTER — Other Ambulatory Visit: Payer: Self-pay

## 2023-05-06 ENCOUNTER — Ambulatory Visit: Payer: Self-pay

## 2023-05-06 NOTE — Patient Instructions (Signed)
Visit Information  Thank you for taking time to visit with me today. Please don't hesitate to contact me if I can be of assistance to you.   Following are the goals we discussed today:   Goals Addressed             This Visit's Progress    I want to eat more and and gain weight       Patient Goals/self care activities: Maintain a healthy weight Eat mall frequent meals through out the day if possible Stay hydrated Be observant of changes in bowel habits Choose foods that are easy to digest Eat protein rich food Continue with smoothies Use rubs to message her legs to help with pain Drink meal supplements        Our next appointment is by telephone on 07/06/23 at 1030 am  Please call the care guide team at [phone] if you need to cancel or reschedule your appointment.   If you are experiencing a Mental Health or Behavioral Health Crisis or need someone to talk to, please call 1-800-273-TALK (toll free, 24 hour hotline)  Patient verbalizes understanding of instructions and care plan provided today and agrees to view in MyChart. Active MyChart status and patient understanding of how to access instructions and care plan via MyChart confirmed with patient.     Juanell Fairly RN, BSN, [facility] Care Coordinator [facility]   Phone: [phone]

## 2023-05-06 NOTE — Patient Outreach (Signed)
  Care Coordination   Follow Up Visit Note   05/06/2023 Name: Kathleen Stone MRN: [id number] DOB: 11-27-1965  Kathleen Stone is a 57 y.o. year old female who sees Masters, Florentina Addison, DO for primary care. I spoke with  Kathleen Stone by phone today.  What matters to the patients health and wellness today?  Ms. Hipkins is making progress in her treatment journey. She will start radiation tomorrow and has been able to improve her appetite, although she still experiences a lack of taste. She has gained 3 to 4 pounds, which has increased her energy levels. She has decided to stop taking medications due to their side effects.    Goals Addressed             This Visit's Progress    I want to eat more and and gain weight       Patient Goals/self care activities: Maintain a healthy weight Eat mall frequent meals through out the day if possible Stay hydrated Be observant of changes in bowel habits Choose foods that are easy to digest Eat protein rich food Continue with smoothies Use rubs to message her legs to help with pain Drink meal supplements        SDOH assessments and interventions completed:  No     Care Coordination Interventions:  Yes, provided   Interventions Today    Flowsheet Row Most Recent Value  Chronic Disease   Chronic disease during today's visit Other  [mass]  General Interventions   General Interventions Discussed/Reviewed General Interventions Discussed  Pharmacy Interventions   Pharmacy Dicussed/Reviewed Pharmacy Topics Discussed  Safety Interventions   Safety Discussed/Reviewed Safety Reviewed        Follow up plan: Follow up call scheduled for 07/06/23  1030 am    Encounter Outcome:  Pt. Visit Completed   Juanell Fairly RN, BSN, [facility] Care Coordinator [facility]   Phone: [phone]

## 2023-05-07 ENCOUNTER — Ambulatory Visit
Admission: RE | Admit: 2023-05-07 | Discharge: 2023-05-07 | Disposition: A | Discharge status: Home / Self Care | Payer: Commercial Managed Care - HMO | Source: Ambulatory Visit | Attending: Radiation Oncology | Admitting: Radiation Oncology

## 2023-05-07 ENCOUNTER — Other Ambulatory Visit: Payer: Self-pay

## 2023-05-07 DIAGNOSIS — C251 Malignant neoplasm of body of pancreas: Secondary | ICD-10-CM | POA: Diagnosis not present

## 2023-05-07 LAB — RAD ONC ARIA SESSION SUMMARY
Course Elapsed Days: 0
Plan Fractions Treated to Date: 1
Plan Prescribed Dose Per Fraction: 6.6 Gy
Plan Total Fractions Prescribed: 5
Plan Total Prescribed Dose: 33 Gy
Reference Point Dosage Given to Date: 6.6 Gy
Reference Point Session Dosage Given: 6.6 Gy
Session Number: 1

## 2023-05-08 ENCOUNTER — Ambulatory Visit: Payer: Commercial Managed Care - HMO | Admitting: Radiation Oncology

## 2023-05-09 ENCOUNTER — Other Ambulatory Visit: Payer: Self-pay

## 2023-05-09 ENCOUNTER — Ambulatory Visit
Admission: RE | Admit: 2023-05-09 | Discharge: 2023-05-09 | Disposition: A | Discharge status: Home / Self Care | Payer: Commercial Managed Care - HMO | Source: Ambulatory Visit | Attending: Radiation Oncology | Admitting: Radiation Oncology

## 2023-05-09 DIAGNOSIS — C251 Malignant neoplasm of body of pancreas: Secondary | ICD-10-CM | POA: Diagnosis not present

## 2023-05-09 LAB — RAD ONC ARIA SESSION SUMMARY
Course Elapsed Days: 2
Plan Fractions Treated to Date: 2
Plan Prescribed Dose Per Fraction: 6.6 Gy
Plan Total Fractions Prescribed: 5
Plan Total Prescribed Dose: 33 Gy
Reference Point Dosage Given to Date: 13.2 Gy
Reference Point Session Dosage Given: 6.6 Gy
Session Number: 2

## 2023-05-10 ENCOUNTER — Ambulatory Visit: Payer: Commercial Managed Care - HMO | Admitting: Radiation Oncology

## 2023-05-13 ENCOUNTER — Ambulatory Visit
Admission: RE | Admit: 2023-05-13 | Payer: Commercial Managed Care - HMO | Source: Ambulatory Visit | Admitting: Radiation Oncology

## 2023-05-13 ENCOUNTER — Other Ambulatory Visit: Payer: Self-pay

## 2023-05-13 DIAGNOSIS — C251 Malignant neoplasm of body of pancreas: Secondary | ICD-10-CM | POA: Diagnosis not present

## 2023-05-13 LAB — RAD ONC ARIA SESSION SUMMARY
Course Elapsed Days: 6
Plan Fractions Treated to Date: 3
Plan Prescribed Dose Per Fraction: 6.6 Gy
Plan Total Fractions Prescribed: 5
Plan Total Prescribed Dose: 33 Gy
Reference Point Dosage Given to Date: 19.8 Gy
Reference Point Session Dosage Given: 6.6 Gy
Session Number: 3

## 2023-05-15 ENCOUNTER — Ambulatory Visit
Admission: RE | Admit: 2023-05-15 | Discharge: 2023-05-15 | Disposition: A | Discharge status: Home / Self Care | Payer: Commercial Managed Care - HMO | Source: Ambulatory Visit | Attending: Radiation Oncology | Admitting: Radiation Oncology

## 2023-05-15 ENCOUNTER — Other Ambulatory Visit: Payer: Self-pay

## 2023-05-15 DIAGNOSIS — C251 Malignant neoplasm of body of pancreas: Secondary | ICD-10-CM | POA: Diagnosis not present

## 2023-05-15 LAB — RAD ONC ARIA SESSION SUMMARY
Course Elapsed Days: 8
Plan Fractions Treated to Date: 4
Plan Prescribed Dose Per Fraction: 6.6 Gy
Plan Total Fractions Prescribed: 5
Plan Total Prescribed Dose: 33 Gy
Reference Point Dosage Given to Date: 26.4 Gy
Reference Point Session Dosage Given: 6.6 Gy
Session Number: 4

## 2023-05-17 ENCOUNTER — Ambulatory Visit
Admission: RE | Admit: 2023-05-17 | Discharge: 2023-05-17 | Disposition: A | Discharge status: Home / Self Care | Payer: Commercial Managed Care - HMO | Source: Ambulatory Visit | Attending: Radiation Oncology

## 2023-05-17 ENCOUNTER — Other Ambulatory Visit: Payer: Self-pay

## 2023-05-17 ENCOUNTER — Ambulatory Visit
Admission: RE | Admit: 2023-05-17 | Discharge: 2023-05-17 | Disposition: A | Discharge status: Home / Self Care | Payer: Commercial Managed Care - HMO | Source: Ambulatory Visit | Attending: Radiation Oncology | Admitting: Radiation Oncology

## 2023-05-17 DIAGNOSIS — C251 Malignant neoplasm of body of pancreas: Secondary | ICD-10-CM | POA: Diagnosis not present

## 2023-05-17 LAB — RAD ONC ARIA SESSION SUMMARY
Course Elapsed Days: 10
Plan Fractions Treated to Date: 5
Plan Prescribed Dose Per Fraction: 6.6 Gy
Plan Total Fractions Prescribed: 5
Plan Total Prescribed Dose: 33 Gy
Reference Point Dosage Given to Date: 33 Gy
Reference Point Session Dosage Given: 6.6 Gy
Session Number: 5

## 2023-05-21 NOTE — Radiation Completion Notes (Addendum)
  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 The patient tolerated radiation. She developed fatigue and some loose stool during treatment.  The patient will receive a call in about one month from the radiation oncology department. She will continue follow up with Dr. Truett Perna as well.      Osker Mason, PAC

## 2023-06-26 ENCOUNTER — Encounter: Payer: Self-pay | Admitting: Oncology

## 2023-06-27 ENCOUNTER — Inpatient Hospital Stay: Payer: Medicaid Other | Admitting: Oncology

## 2023-06-27 ENCOUNTER — Inpatient Hospital Stay: Payer: Medicaid Other

## 2023-07-01 ENCOUNTER — Inpatient Hospital Stay: Payer: Medicaid Other | Attending: Oncology

## 2023-07-01 ENCOUNTER — Inpatient Hospital Stay: Payer: Medicaid Other

## 2023-07-01 ENCOUNTER — Encounter: Payer: Self-pay | Admitting: Oncology

## 2023-07-01 ENCOUNTER — Inpatient Hospital Stay (HOSPITAL_BASED_OUTPATIENT_CLINIC_OR_DEPARTMENT_OTHER): Payer: Medicaid Other | Admitting: Oncology

## 2023-07-01 VITALS — BP 140/70 | HR 63 | Temp 98.2°F | Resp 18 | Ht 66.0 in | Wt 160.0 lb

## 2023-07-01 DIAGNOSIS — Z23 Encounter for immunization: Secondary | ICD-10-CM

## 2023-07-01 DIAGNOSIS — C251 Malignant neoplasm of body of pancreas: Secondary | ICD-10-CM

## 2023-07-01 DIAGNOSIS — Z95828 Presence of other vascular implants and grafts: Secondary | ICD-10-CM

## 2023-07-01 MED ORDER — HEPARIN SOD (PORK) LOCK FLUSH 100 UNIT/ML IV SOLN
500.0000 [IU] | Freq: Once | INTRAVENOUS | Status: AC
  Administered 2023-07-01: 500 [IU] via INTRAVENOUS

## 2023-07-01 MED ORDER — INFLUENZA VIRUS VACC SPLIT PF (FLUZONE) 0.5 ML IM SUSY
0.5000 mL | PREFILLED_SYRINGE | Freq: Once | INTRAMUSCULAR | Status: AC
  Administered 2023-07-01: 0.5 mL via INTRAMUSCULAR
  Filled 2023-07-01: qty 0.5

## 2023-07-01 MED ORDER — SODIUM CHLORIDE 0.9% FLUSH
10.0000 mL | Freq: Once | INTRAVENOUS | Status: AC
  Administered 2023-07-01: 10 mL via INTRAVENOUS

## 2023-07-01 NOTE — Progress Notes (Signed)
[facility] OFFICE PROGRESS NOTE   Diagnosis: Pancreas cancer  INTERVAL HISTORY:   Kathleen Stone returns as scheduled.  She completed SBRT to the pancreas 05/17/2023.  She reports tolerating the radiation well.  No nausea or malaise.  She feels well.  No abdominal pain.  Good appetite.  Objective:  Vital signs in last 24 hours:  Blood pressure (!) 140/70, pulse 63, temperature 98.2 F (36.8 C), temperature source Temporal, resp. rate 18, height 5\' 6"  (1.676 m), weight 160 lb (72.6 kg), last menstrual period 02/07/2016, SpO2 100%. Lymphatics: No cervical or supraclavicular nodes Resp: Lungs clear bilaterally Cardio: Regular rate and rhythm GI: No mass, no hepatosplenomegaly, nontender Vascular: No leg edema    Portacath/PICC-without erythema  Lab Results:  Lab Results  Component Value Date   WBC 9.6 05/02/2023   HGB 12.2 05/02/2023   HCT 36.2 05/02/2023   MCV 97.3 05/02/2023   PLT 168 05/02/2023   NEUTROABS 6.8 05/02/2023    CMP  Lab Results  Component Value Date   NA 141 05/02/2023   K 3.6 05/02/2023   CL 107 05/02/2023   CO2 28 05/02/2023   GLUCOSE 88 05/02/2023   BUN 12 05/02/2023   CREATININE 0.76 05/02/2023   CALCIUM 9.0 05/02/2023   PROT 6.3 (L) 05/02/2023   ALBUMIN 3.2 (L) 05/02/2023   AST 18 05/02/2023   ALT 11 05/02/2023   ALKPHOS 79 05/02/2023   BILITOT 0.4 05/02/2023   GFRNONAA >60 05/02/2023   GFRAA >60 02/29/2020    Lab Results  Component Value Date   CEA1 27.4 (H) 07/31/2022   WUJ811 14 05/02/2023    Medications: I have reviewed the patient's current medications.   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 CT abdomen/pelvis 07/30/2022-body/neck pancreas mass with peripheral pancreatic atrophy and duct dilation, suspected encasement of the celiac axis, uterine fibroids MRI abdomen 07/30/2022-3.7 x 4.7 cm pancreas body/tail mass with encasement of the proximal celiac artery, occlusion with collateralization at the confluence of  the SMV and portal vein, no evidence of metastatic disease CT chest 08/01/2022-no intrathoracic metastases Elevated CA 19-9 EUS 08/02/2022-gastric polyp, gastric erosions, 32 x 33 mm pancreas body mass, invasion of the celiac trunk, invasion of the splenoportal confluence, no SMA invasion, 2 malignant appearing nodes in the porta hepatis,T4N1 by endoscopic criteria, FNA biopsy-adenocarcinoma 08/17/2022 Guardant360-tumor mutation burden 0.96; MSI high not detected; variant of uncertain clinical significance NTRK3 T93M Cycle 1 gemcitabine/Abraxane 08/17/2022 Cycle 2 gemcitabine/Abraxane 08/31/2022 Cycle 3 gemcitabine/Abraxane 09/14/2022 Cycle 4 gemcitabine/Abraxane 09/28/2022 Cycle 5 gemcitabine/Abraxane 10/12/2022 Cycle 6 gemcitabine/Abraxane 10/26/2022 CT abdomen/pelvis 10/27/2022-stable pancreas body mass with encasement of the celiac axis and portal vein with occlusion of the splenic vein and varices.  Stable porta hepatis lymph nodes. Cycle 7 gemcitabine/Abraxane 11/09/2022 Cycle 8 gemcitabine/Abraxane 11/23/2022 Cycle 9 gemcitabine/Abraxane 12/07/2022 Cycle 10 gemcitabine/Abraxane 12/21/2022 Cycle 11 gemcitabine/Abraxane 01/04/2023 Cycle 12 gemcitabine/Abraxane 01/18/2023 Cycle 13 gemcitabine/Abraxane 02/01/2023 Cycle 14 gemcitabine/Abraxane 02/15/2023 CT abdomen/pelvis 02/26/2023-stable appearance of pancreas body mass with vascular encasement and chronic occlusion of the splenic vein, no evidence of metastatic disease Cycle 15 gemcitabine/Abraxane 03/01/2023 Cycle 16 gemcitabine/Abraxane 03/15/2023 Cycle 17 Gemcitabine/Abraxane 03/29/2023 EUS pancreas fiducial marker placement 04/17/2023 Pancreas SBRT 05/07/2023 - 05/17/2023, 33 Gray in 5 fractions    2.  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  Anorexia/weight loss secondary to #1 4.  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  Abdominal pain 6.  History of colon polyps 7.   Family history of multiple cancers Pancreas  cancer maternal aunt, maternal cousin, maternal grandfather Breast cancer-maternal aunt, maternal cousin Prostate cancer-maternal uncle Lung cancer-father 8.  Hypertension 9.  Postmenopausal vaginal bleeding 10.  H. pylori 08/02/2022-amoxicillin and Flagyl discontinued 08/21/2022 due to GI symptoms 11.  Syncope event 03/07/2023      Disposition: [name] appears well.  She completed SBRT to the pancreas in August.  There is no clinical evidence for progression of the pancreas cancer.  She will return for an office visit and CA 19-9 in 6-7 weeks.  We will plan for a restaging CT evaluation approximately 6 months after completion of radiation.  [name] received an influenza vaccine today.  Thornton Papas, MD  07/01/2023  11:53 AM

## 2023-07-02 ENCOUNTER — Other Ambulatory Visit: Payer: Self-pay

## 2023-07-08 ENCOUNTER — Other Ambulatory Visit: Payer: Self-pay | Admitting: Internal Medicine

## 2023-07-08 DIAGNOSIS — F22 Delusional disorders: Secondary | ICD-10-CM

## 2023-07-08 NOTE — Progress Notes (Signed)
Patient's sister reports that patient has been telling her that she feels like someone is coming into her bedroom at night. Family notes that she remains focused on finding soulmate.  Kathleen Stone would be open to talking with behavioral health.

## 2023-07-09 ENCOUNTER — Ambulatory Visit: Payer: Self-pay

## 2023-07-09 NOTE — Patient Instructions (Signed)
Visit Information  Thank you for taking time to visit with me today. Please don't hesitate to contact me if I can be of assistance to you.   Following are the goals we discussed today:   Goals Addressed             This Visit's Progress    I want to eat more and and gain weight       Patient Goals/self care activities: -Patient/Caregiver will take medications as prescribed   -Patient/Caregiver will attend all scheduled provider appointments -Patient/Caregiver will call pharmacy for medication refills 3-7 days in advance of running out of medications -Patient/Caregiver will call provider office for new concerns or questions  -Patient/Caregiver will focus on medication adherence by taking medications as prescribed   Maintain a healthy weight Eat mall frequent meals through out the day if possible Stay hydrated Be observant of changes in bowel habits Choose foods that are easy to digest Eat protein rich food Continue with smoothies Use rubs to message her legs to help with pain Drink meal supplements Do not to exert to much energy to fast        Our next appointment is by telephone on 09/13/23 at 11 am  Please call the care guide team at [phone] if you need to cancel or reschedule your appointment.   If you are experiencing a Mental Health or Behavioral Health Crisis or need someone to talk to, please call 1-800-273-TALK (toll free, 24 hour hotline)  Patient verbalizes understanding of instructions and care plan provided today and agrees to view in MyChart. Active MyChart status and patient understanding of how to access instructions and care plan via MyChart confirmed with patient.     Juanell Fairly RN, BSN, [facility] Phone: [phone]

## 2023-07-09 NOTE — Patient Outreach (Signed)
Care Coordination   Follow Up Visit Note   07/09/2023 Name: Kathleen Stone MRN: [id number] DOB: April 27, 1966  Kathleen Stone is a 57 y.o. year old female who sees Masters, Florentina Addison, DO for primary care. I spoke with  Kathleen Stone by phone today.  What matters to the patients health and wellness today?  I spoke with Kathleen Stone, who seemed to be in a great mood. She mentioned that she has more energy and is able to eat more without experiencing nausea or vomiting. She is staying hydrated, but she still struggles with sleep. The physician has decided to wait until December 2nd for an MRI to check if her mass has shrunk. She also shared that she has booked a trip to [place] for November 7th, and her family plans to go to the beach during spring break for the kids.      Goals Addressed             This Visit's Progress    I want to eat more and and gain weight       Patient Goals/self care activities: -Patient/Caregiver will take medications as prescribed   -Patient/Caregiver will attend all scheduled provider appointments -Patient/Caregiver will call pharmacy for medication refills 3-7 days in advance of running out of medications -Patient/Caregiver will call provider office for new concerns or questions  -Patient/Caregiver will focus on medication adherence by taking medications as prescribed   Maintain a healthy weight Eat mall frequent meals through out the day if possible Stay hydrated Be observant of changes in bowel habits Choose foods that are easy to digest Eat protein rich food Continue with smoothies Use rubs to message her legs to help with pain Drink meal supplements Do not to exert to much energy to fast        SDOH assessments and interventions completed:  No     Care Coordination Interventions:  Yes, provided   Interventions Today    Flowsheet Row Most Recent Value  Chronic Disease   Chronic disease during today's visit Other  [Mass]  General  Interventions   General Interventions Discussed/Reviewed General Interventions Discussed  Nutrition Interventions   Nutrition Discussed/Reviewed Nutrition Discussed, Supplemental nutrition, Fluid intake  Pharmacy Interventions   Pharmacy Dicussed/Reviewed Pharmacy Topics Discussed  Safety Interventions   Safety Discussed/Reviewed Safety Discussed        Follow up plan: Follow up call scheduled for 09/13/23  11 am    Encounter Outcome:  Patient Visit Completed   Juanell Fairly RN, BSN, [facility]   Care Coordinator Phone: [phone]

## 2023-07-10 ENCOUNTER — Other Ambulatory Visit: Payer: Self-pay

## 2023-07-15 ENCOUNTER — Encounter: Payer: Self-pay | Admitting: Oncology

## 2023-07-22 ENCOUNTER — Encounter: Payer: Self-pay | Admitting: Oncology

## 2023-07-22 ENCOUNTER — Ambulatory Visit: Payer: Medicaid Other | Admitting: Licensed Clinical Social Worker

## 2023-07-22 DIAGNOSIS — F22 Delusional disorders: Secondary | ICD-10-CM

## 2023-07-22 NOTE — BH Specialist Note (Signed)
Integrated Behavioral Health Initial In-Person Visit  MRN: [id number] Name: Kathleen Stone  Number of Integrated Behavioral Health Clinician visits: 1- Initial Visit  Session Start time: 1330    Session End time: 1500  Total time in minutes: 90   Types of Service: Introduction only  Interpretor:No. Interpretor Name and Language: N/A   Warm Hand Off Completed.        Subjective: Kathleen Stone is a 57 y.o. female  Patient was referred by PCP . Patient reports the following symptoms/concerns: 57 year old [place] American female, married but separated for 1.5 years, identifies as lesbian, and receives Medicaid. Patient has a history of cancer and presents with paranoid ideations and possible tactile hallucinations, particularly at night. She reports feeling abdominal tenderness and sensing entities trying to crawl into bed with her or enter her body. Patient expresses delusional beliefs about spiritual impregnation and religious preoccupations. She has discontinued all medications over a year ago and denies auditory hallucinations. Recent behaviors include nightly tampon insertion to prevent perceived spiritual impregnation and episodes of yelling and screaming. Patient reports occasional wine cooler consumption but denies drug use. Given the severity of symptoms and potential risks, it is recommended that the patient voluntarily admit herself to [facility] for inpatient psychiatric evaluation and care. Patient reports getting on an upcoming flight to [facility] with a friend 11/07-11/15. Patient stated she would check herself into behavioral health on her return. [facility] to note that patient is no interested in any medications. Patient denies suicidal ideations or homicidal ideations.   Duration of problem: 3 months; Severity of problem: severe  Objective: Mood: Anxious and Affect: Appropriate Risk of harm to self or others: No plan to harm self or  others  Life Context: Family and Social: Patient has sisters and connected with family and friends School/Work: N/A Self-Care: N/A Life Changes: Cancer diagnoses and discontinue of treatment   Patient and/or Family's Strengths/Protective Factors: Social connections and Sense of purpose  Goals Addressed: Patient will: Reduce symptoms of:  Paranoia   Progress towards Goals: Ongoing  Interventions: Interventions utilized: Link to [employer]  Standardized Assessments completed: Patient declined screening  Patient and/or Family Response: Patient agreed to seeing [facility] and to continue services with [facility]  Assessment: Patient currently experiencing paranoia.   Patient may benefit from Inpatient/ Outpatient psychiatric services  and individual therapy.  Plan: Follow up with behavioral health clinician on : 11/20  Christen Butter, MSW, LCSW-A She/Her Behavioral Health Clinician [facility]  Internal Medicine Center Direct Dial:[phone]  Fax [phone] Main Office Phone: [phone] [address] [place] [place], [place] [address] Website: [facility] Internal Medicine [facility]  [place], [place]  [place]

## 2023-07-29 ENCOUNTER — Encounter: Payer: Self-pay | Admitting: Oncology

## 2023-08-05 ENCOUNTER — Ambulatory Visit
Admission: RE | Admit: 2023-08-05 | Discharge: 2023-08-05 | Disposition: A | Discharge status: Home / Self Care | Payer: Commercial Managed Care - HMO | Source: Ambulatory Visit | Attending: Radiation Oncology | Admitting: Radiation Oncology

## 2023-08-05 NOTE — Progress Notes (Signed)
.   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   Symptoms of fatigue have improved since completing therapy.  Symptoms of skin changes have improved since completing therapy.  Symptoms of nausea or vomiting have improved since completing therapy.   The patient has scheduled follow up with her medical oncologist Dr. Truett Perna for ongoing surveillance, and was encouraged to call if she develops concerns or questions regarding radiation.  This concludes the interaction.  Ruel Favors, LPN

## 2023-08-08 ENCOUNTER — Ambulatory Visit (INDEPENDENT_AMBULATORY_CARE_PROVIDER_SITE_OTHER): Payer: Medicaid Other | Admitting: Licensed Clinical Social Worker

## 2023-08-08 ENCOUNTER — Encounter: Payer: Self-pay | Admitting: Oncology

## 2023-08-08 DIAGNOSIS — F22 Delusional disorders: Secondary | ICD-10-CM

## 2023-08-08 NOTE — Addendum Note (Signed)
Addended by: Lucille Passy on: 08/08/2023 05:05 PM   Modules accepted: Orders
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
## 2023-08-15 ENCOUNTER — Other Ambulatory Visit: Payer: Self-pay

## 2023-08-21 ENCOUNTER — Encounter: Payer: Self-pay | Admitting: Oncology

## 2023-08-23 ENCOUNTER — Inpatient Hospital Stay: Payer: Medicaid Other

## 2023-08-23 ENCOUNTER — Inpatient Hospital Stay (HOSPITAL_BASED_OUTPATIENT_CLINIC_OR_DEPARTMENT_OTHER): Payer: Commercial Managed Care - HMO | Admitting: Oncology

## 2023-08-23 ENCOUNTER — Inpatient Hospital Stay: Payer: Commercial Managed Care - HMO | Attending: Oncology

## 2023-08-23 VITALS — BP 153/72 | HR 60 | Temp 98.2°F | Resp 18 | Ht 66.0 in | Wt 172.2 lb

## 2023-08-23 DIAGNOSIS — C251 Malignant neoplasm of body of pancreas: Secondary | ICD-10-CM

## 2023-08-23 DIAGNOSIS — Z9221 Personal history of antineoplastic chemotherapy: Secondary | ICD-10-CM | POA: Diagnosis not present

## 2023-08-23 DIAGNOSIS — Z452 Encounter for adjustment and management of vascular access device: Secondary | ICD-10-CM | POA: Insufficient documentation

## 2023-08-23 DIAGNOSIS — Z801 Family history of malignant neoplasm of trachea, bronchus and lung: Secondary | ICD-10-CM | POA: Diagnosis not present

## 2023-08-23 DIAGNOSIS — Z8042 Family history of malignant neoplasm of prostate: Secondary | ICD-10-CM | POA: Diagnosis not present

## 2023-08-23 DIAGNOSIS — Z803 Family history of malignant neoplasm of breast: Secondary | ICD-10-CM | POA: Insufficient documentation

## 2023-08-23 DIAGNOSIS — Z95828 Presence of other vascular implants and grafts: Secondary | ICD-10-CM

## 2023-08-23 DIAGNOSIS — Z800 Family history of malignant neoplasm of digestive organs: Secondary | ICD-10-CM | POA: Diagnosis not present

## 2023-08-23 MED ORDER — SODIUM CHLORIDE 0.9% FLUSH
10.0000 mL | INTRAVENOUS | Status: DC | PRN
  Administered 2023-08-23: 10 mL via INTRAVENOUS

## 2023-08-23 MED ORDER — HEPARIN SOD (PORK) LOCK FLUSH 100 UNIT/ML IV SOLN
500.0000 [IU] | Freq: Once | INTRAVENOUS | Status: AC
  Administered 2023-08-23: 500 [IU] via INTRAVENOUS

## 2023-08-23 NOTE — Progress Notes (Signed)
[facility] OFFICE PROGRESS NOTE   Diagnosis: Pancreas cancer  INTERVAL HISTORY:   Kathleen Stone returns as scheduled.  She feels well.  Good appetite.  No pain.  She has intermittent diarrhea.  Objective:  Vital signs in last 24 hours:  Blood pressure (!) 153/72, pulse 60, temperature 98.2 F (36.8 C), temperature source Temporal, resp. rate 18, height 5\' 6"  (1.676 m), weight 172 lb 3.2 oz (78.1 kg), last menstrual period 02/07/2016, SpO2 100%.   Resp: Lungs clear bilaterally Cardio: Regular rate and rhythm GI: Nontender, no mass, no hepatosplenomegaly Vascular: No leg edema    Portacath/PICC-without erythema  Lab Results:  Lab Results  Component Value Date   WBC 9.6 05/02/2023   HGB 12.2 05/02/2023   HCT 36.2 05/02/2023   MCV 97.3 05/02/2023   PLT 168 05/02/2023   NEUTROABS 6.8 05/02/2023    CMP  Lab Results  Component Value Date   NA 141 05/02/2023   K 3.6 05/02/2023   CL 107 05/02/2023   CO2 28 05/02/2023   GLUCOSE 88 05/02/2023   BUN 12 05/02/2023   CREATININE 0.76 05/02/2023   CALCIUM 9.0 05/02/2023   PROT 6.3 (L) 05/02/2023   ALBUMIN 3.2 (L) 05/02/2023   AST 18 05/02/2023   ALT 11 05/02/2023   ALKPHOS 79 05/02/2023   BILITOT 0.4 05/02/2023   GFRNONAA >60 05/02/2023   GFRAA >60 02/29/2020    Lab Results  Component Value Date   CEA1 27.4 (H) 07/31/2022   KYH062 14 05/02/2023    Medications: I have reviewed the patient's current medications.   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 CT abdomen/pelvis 07/30/2022-body/neck pancreas mass with peripheral pancreatic atrophy and duct dilation, suspected encasement of the celiac axis, uterine fibroids MRI abdomen 07/30/2022-3.7 x 4.7 cm pancreas body/tail mass with encasement of the proximal celiac artery, occlusion with collateralization at the confluence of the SMV and portal vein, no evidence of metastatic disease CT chest 08/01/2022-no intrathoracic metastases Elevated CA 19-9 EUS  08/02/2022-gastric polyp, gastric erosions, 32 x 33 mm pancreas body mass, invasion of the celiac trunk, invasion of the splenoportal confluence, no SMA invasion, 2 malignant appearing nodes in the porta hepatis,T4N1 by endoscopic criteria, FNA biopsy-adenocarcinoma 08/17/2022 Guardant360-tumor mutation burden 0.96; MSI high not detected; variant of uncertain clinical significance NTRK3 T93M Cycle 1 gemcitabine/Abraxane 08/17/2022 Cycle 2 gemcitabine/Abraxane 08/31/2022 Cycle 3 gemcitabine/Abraxane 09/14/2022 Cycle 4 gemcitabine/Abraxane 09/28/2022 Cycle 5 gemcitabine/Abraxane 10/12/2022 Cycle 6 gemcitabine/Abraxane 10/26/2022 CT abdomen/pelvis 10/27/2022-stable pancreas body mass with encasement of the celiac axis and portal vein with occlusion of the splenic vein and varices.  Stable porta hepatis lymph nodes. Cycle 7 gemcitabine/Abraxane 11/09/2022 Cycle 8 gemcitabine/Abraxane 11/23/2022 Cycle 9 gemcitabine/Abraxane 12/07/2022 Cycle 10 gemcitabine/Abraxane 12/21/2022 Cycle 11 gemcitabine/Abraxane 01/04/2023 Cycle 12 gemcitabine/Abraxane 01/18/2023 Cycle 13 gemcitabine/Abraxane 02/01/2023 Cycle 14 gemcitabine/Abraxane 02/15/2023 CT abdomen/pelvis 02/26/2023-stable appearance of pancreas body mass with vascular encasement and chronic occlusion of the splenic vein, no evidence of metastatic disease Cycle 15 gemcitabine/Abraxane 03/01/2023 Cycle 16 gemcitabine/Abraxane 03/15/2023 Cycle 17 Gemcitabine/Abraxane 03/29/2023 EUS pancreas fiducial marker placement 04/17/2023 Pancreas SBRT 05/07/2023 - 05/17/2023, 33 Gray in 5 fractions    2.  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  Anorexia/weight loss secondary to #1 4.  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  Abdominal pain 6.  History of colon polyps 7.  Family history of multiple cancers Pancreas cancer maternal aunt, maternal cousin, maternal grandfather Breast cancer-maternal  aunt, maternal cousin Prostate cancer-maternal uncle Lung cancer-father 8.  Hypertension 9.  Postmenopausal vaginal bleeding 10.  H. pylori 08/02/2022-amoxicillin and Flagyl discontinued 08/21/2022 due to GI symptoms 11.  Syncope event 03/07/2023   Disposition: [name] has a history of locally advanced pancreas cancer.  She completed SBRT to the pancreas 05/17/2023.  There is no clinical evidence of disease progression.  We will follow-up on the CA 19-9 from today.  A Port-A-Cath remains in place.  She will return for an office visit and Port-A-Cath flush 10/10/2022.  We will plan for a restaging CT evaluation after the next office visit.  Thornton Papas, MD  08/23/2023  10:33 AM

## 2023-08-25 LAB — CANCER ANTIGEN 19-9: CA 19-9: 33 U/mL (ref 0–35)

## 2023-08-26 ENCOUNTER — Telehealth: Payer: Self-pay

## 2023-08-26 NOTE — Telephone Encounter (Signed)
Patient gave verbal understanding and had no further questions or concerns  

## 2023-08-26 NOTE — Telephone Encounter (Signed)
-----   Message from Thornton Papas sent at 08/25/2023  2:47 PM EST ----- Please call patient, ca19-9 is normal, f/u as scheduled

## 2023-09-05 ENCOUNTER — Ambulatory Visit (INDEPENDENT_AMBULATORY_CARE_PROVIDER_SITE_OTHER): Payer: Commercial Managed Care - HMO | Admitting: Licensed Clinical Social Worker

## 2023-09-05 DIAGNOSIS — F22 Delusional disorders: Secondary | ICD-10-CM | POA: Diagnosis present

## 2023-09-05 NOTE — BH Specialist Note (Signed)
Integrated Behavioral Health via Telemedicine Visit  09/05/2023 Kathleen Stone [id number]  Number of Integrated Behavioral Health Clinician visits: Additional Visit  Session Start time: 1430   Session End time: 1530  Total time in minutes: 60   Referring Provider: PCP Patient/Family location: [facility] [employer] Provider location: Office All persons participating in visit: Patient and [facility] Types of Service: Individual psychotherapy  I connected with [name]  via  Telephone oand verified that I am speaking with the correct person using two identifiers. Discussed confidentiality: Yes   I discussed the limitations of telemedicine and the availability of in person appointments.  Discussed there is a possibility of technology failure and discussed alternative modes of communication if that failure occurs.  I discussed that engaging in this telemedicine visit, they consent to the provision of behavioral healthcare and the services will be billed under their insurance.  Patient and/or legal guardian expressed understanding and consented to Telemedicine visit: Yes   Presenting Concerns: Patient and/or family reports the following symptoms/concerns: On September 05, 2023, a telephone session was conducted by [place], [facility] with the patient discussing her Thanksgiving holiday experience. The patient reported having a great holiday and noted that spirits were not troubling her. She expressed concerns about potentially being pregnant, describing feelings of "eating for three" and experiencing weight gain or potential medical issues. The [facility] offered to contact nurse triage for a medical appointment, which the patient declined. Subsequently, the [facility] followed up regarding assistance with [facility], and the patient confirmed she would independently pursue follow-up. During the session, the [facility] engaged in a comprehensive discussion about paranoia, helping the patient understand symptoms  and differentiate between perceived threats and reality. They explored coping mechanisms, focusing on strategies to manage intrusive thoughts, maintain emotional balance, and develop healthy mental health practices. The conversation included techniques for reality testing, managing suspicious thoughts, and building resilience. The [facility] provided supportive guidance, encouraging the patient to seek additional medical consultation if her concerns about potential pregnancy or weight changes persist, while maintaining a compassionate and non-judgmental approach to her expressed experiences.   Patient and/or Family's Strengths/Protective Factors: Social connections  Goals Addressed: Patient will:  Reduce symptoms of:  Paranoia    Progress towards Goals: Ongoing  Interventions: Interventions utilized:  CBT Cognitive Behavioral Therapy Standardized Assessments completed: Not Needed  Patient and/or Family Response: Patient agrees to services  Assessment: Patient currently experiencing Paranoia.   Patient may benefit from [facility] .  Plan: Follow up with behavioral health clinician on : within the next 30days   I discussed the assessment and treatment plan with the patient and/or parent/guardian. They were provided an opportunity to ask questions and all were answered. They agreed with the plan and demonstrated an understanding of the instructions.   They were advised to call back or seek an in-person evaluation if the symptoms worsen or if the condition fails to improve as anticipated. Christen Butter, MSW, LCSW-A She/Her Behavioral Health Clinician [facility]  Internal Medicine Center Direct Dial:[phone]  Fax [phone] Main Office Phone: [phone] [address] [place] [place], [place] [address] Website: [facility] Internal Medicine [facility]  [place], [place]  [employer]

## 2023-09-09 ENCOUNTER — Encounter (HOSPITAL_BASED_OUTPATIENT_CLINIC_OR_DEPARTMENT_OTHER): Payer: Self-pay

## 2023-09-09 ENCOUNTER — Encounter: Payer: Self-pay | Admitting: Oncology

## 2023-09-09 ENCOUNTER — Ambulatory Visit (HOSPITAL_BASED_OUTPATIENT_CLINIC_OR_DEPARTMENT_OTHER)
Admission: RE | Admit: 2023-09-09 | Discharge: 2023-09-09 | Disposition: A | Discharge status: Home / Self Care | Payer: Medicaid Other | Source: Ambulatory Visit | Attending: Student in an Organized Health Care Education/Training Program | Admitting: Student in an Organized Health Care Education/Training Program

## 2023-09-09 ENCOUNTER — Encounter: Payer: Self-pay | Admitting: Internal Medicine

## 2023-09-09 ENCOUNTER — Ambulatory Visit (INDEPENDENT_AMBULATORY_CARE_PROVIDER_SITE_OTHER): Payer: Commercial Managed Care - HMO | Admitting: Internal Medicine

## 2023-09-09 VITALS — BP 160/73 | HR 57 | Temp 98.6°F | Ht 66.0 in | Wt 171.6 lb

## 2023-09-09 DIAGNOSIS — C259 Malignant neoplasm of pancreas, unspecified: Secondary | ICD-10-CM

## 2023-09-09 DIAGNOSIS — R1010 Upper abdominal pain, unspecified: Secondary | ICD-10-CM

## 2023-09-09 DIAGNOSIS — F17210 Nicotine dependence, cigarettes, uncomplicated: Secondary | ICD-10-CM

## 2023-09-09 DIAGNOSIS — R7303 Prediabetes: Secondary | ICD-10-CM

## 2023-09-09 DIAGNOSIS — Z720 Tobacco use: Secondary | ICD-10-CM

## 2023-09-09 DIAGNOSIS — I10 Essential (primary) hypertension: Secondary | ICD-10-CM

## 2023-09-09 LAB — CBC WITH DIFFERENTIAL/PLATELET
Abs Immature Granulocytes: 0.04 10*3/uL (ref 0.00–0.07)
Basophils Absolute: 0 10*3/uL (ref 0.0–0.1)
Basophils Relative: 0 %
Eosinophils Absolute: 0.1 10*3/uL (ref 0.0–0.5)
Eosinophils Relative: 1 %
HCT: 42.5 % (ref 36.0–46.0)
Hemoglobin: 13.6 g/dL (ref 12.0–15.0)
Immature Granulocytes: 0 %
Lymphocytes Relative: 18 %
Lymphs Abs: 2 10*3/uL (ref 0.7–4.0)
MCH: 29 pg (ref 26.0–34.0)
MCHC: 32 g/dL (ref 30.0–36.0)
MCV: 90.6 fL (ref 80.0–100.0)
Monocytes Absolute: 0.6 10*3/uL (ref 0.1–1.0)
Monocytes Relative: 6 %
Neutro Abs: 8.1 10*3/uL — ABNORMAL HIGH (ref 1.7–7.7)
Neutrophils Relative %: 75 %
Platelets: 152 10*3/uL (ref 150–400)
RBC: 4.69 MIL/uL (ref 3.87–5.11)
RDW: 15.2 % (ref 11.5–15.5)
WBC: 10.8 10*3/uL — ABNORMAL HIGH (ref 4.0–10.5)
nRBC: 0 % (ref 0.0–0.2)

## 2023-09-09 LAB — COMPREHENSIVE METABOLIC PANEL
ALT: 12 U/L (ref 0–44)
AST: 18 U/L (ref 15–41)
Albumin: 3.5 g/dL (ref 3.5–5.0)
Alkaline Phosphatase: 109 U/L (ref 38–126)
Anion gap: 7 (ref 5–15)
BUN: 11 mg/dL (ref 6–20)
CO2: 29 mmol/L (ref 22–32)
Calcium: 9.4 mg/dL (ref 8.9–10.3)
Chloride: 101 mmol/L (ref 98–111)
Creatinine, Ser: 0.81 mg/dL (ref 0.44–1.00)
GFR, Estimated: >60 mL/min (ref >60)
Glucose, Bld: 99 mg/dL (ref 70–99)
Potassium: 2.7 mmol/L — CL (ref 3.5–5.1)
Sodium: 137 mmol/L (ref 135–145)
Total Bilirubin: 0.8 mg/dL (ref <1.2)
Total Protein: 6.8 g/dL (ref 6.5–8.1)

## 2023-09-09 LAB — HEMOGLOBIN A1C
Hgb A1c MFr Bld: 4.9 % (ref 4.8–5.6)
Mean Plasma Glucose: 93.93 mg/dL

## 2023-09-09 NOTE — Progress Notes (Unsigned)
CC: stomach pain  HPI:  Kathleen Stone is a 57 y.o. with medical history of HTN, low grade pilocystic astrocytoma, pancreatic carcinoma, tobacco use disorder presenting to [facility] for complaint of stomach pain.   Please see problem-based list for further details, assessments, and plans.  Past Medical History:  Diagnosis Date   Adhesive capsulitis of right shoulder 01/27/2020   Brain tumor (benign) (HCC)    Cholelithiasis 10/30/2019   Chronic lower back pain    Negative MRI of hips bilaterally, neg CT scan of A and P; MRI of T and L spine-> very mild deg changes from T4-5 and T7-8, cervical spondylotic changes at C5-6, very mild disc bulge at L2-3 without spinal stenosis, nl ESR, ANA, RF, HIV, TSH, and B12   DJD (degenerative joint disease) of cervical spine    Family history of adverse reaction to anesthesia    "sisters PONV"   Galactorrhea of both breasts 2009   R. sided w/ benign papilloma excised in 4/09. L sided in 5/09   Gallstones    H. pylori infection 08/07/2022   Hyperlipidemia    Hypertension    Hypogeusia 05/02/2022   Migraine    "monthly" (11/15/2016)   Mononeuritis 07/28/2010   Qualifier: Diagnosis of   By: Candice Camp         Near syncope 07/24/2012   Neuromuscular disorder (HCC)    left arm and side of face    Neuropathy    Thought to be 2/2 nerve compression in the C-spine   Obesity    Personal history of adenomatous colonic polyp    09/2007 - diminutive adenoma ([facility])   PONV (postoperative nausea and vomiting)    Stroke (HCC)    2021- brain tumor bx caused ? stroke-    Tobacco abuse      Current Outpatient Medications (Cardiovascular):    nitroGLYCERIN (NITROSTAT) 0.4 MG SL tablet, Place 1 tablet (0.4 mg total) under the tongue every 5 (five) minutes as needed for chest pain. (Patient not taking: Reported on 05/02/2023)  Current Outpatient Medications (Respiratory):    albuterol (VENTOLIN HFA) 108 (90 Base) MCG/ACT inhaler, Inhale 2 puffs  into the lungs every 6 (six) hours as needed for wheezing or shortness of breath. (Patient not taking: Reported on 08/23/2023)    Current Outpatient Medications (Other):    lidocaine-prilocaine (EMLA) cream, Apply 1 Application topically as needed.  Review of Systems:  Review of system negative unless stated in the problem list or HPI.    Physical Exam:  Vitals:   09/09/23 1503  BP: (!) 180/74  Pulse: (!) 59  Temp: 98.6 F (37 C)  TempSrc: Oral  SpO2: 100%  Weight: 171 lb 9.6 oz (77.8 kg)  Height: 5\' 6"  (1.676 m)   Physical Exam General: NAD HENT: NCAT Lungs: CTAB, no wheeze, rhonchi or rales.  Cardiovascular: Normal heart sounds, no r/m/g, 2+ pulses in all extremities. No LE edema Abdomen: No TTP, normal bowel sounds MSK: No asymmetry or muscle atrophy.  Skin: no lesions noted on exposed skin Neuro: Alert and oriented x4. CN grossly intact Psych: Normal mood and normal affect   Assessment & Plan:   No problem-specific Assessment & Plan notes found for this encounter.   See Encounters Tab for problem based charting.  Patient Discussed with Dr. {NAMES:[id number]::"Guilloud","Hoffman","Mullen","Narendra","Vincent","Machen","Lau","Hatcher","Williams"} [name], MD Eligha Bridegroom. [facility] Internal Medicine Residency, PGY-3   Repeat A1c, prediabetes last one in 11/2021.   HTN Normal renal function in 03/2023.  Stomach  Pain Cramping pain comes and goes. 2-3 watery loose stools Diarrhea. No fevers, chills. No blood in stools. Eating makes it worse. Some nausea.  GI panel.  In the epigastric region.  S/p cholecystectomy.  Repeat CBC, CMP.   Has hx of pancreatic cancer adenocarcinoma, status post 17 cycles of chemotherapy, SBRT and pancreas fiducial marker placement on 04/17/23. Next CT planned for January 2024.  Weight increase from 160 lbs to 172 today in 2 months.   Care Gaps Cervical cancer screening   Smoking cessation

## 2023-09-09 NOTE — Patient Instructions (Addendum)
Kathleen Stone, it was a pleasure seeing you today! You endorsed feeling well today. Below are some of the things we talked about this visit. We look forward to seeing you in the follow up appointment!  Today we discussed: For your abdominal pain, we will get imaging to look at your cancer and belly pain.   I will send a referral to palliative care team.   I have ordered the following labs today:  Lab Orders  No laboratory test(s) ordered today      Referrals ordered today:   Referral Orders  No referral(s) requested today     I have ordered the following medication/changed the following medications:   Stop the following medications: There are no discontinued medications.   Start the following medications: No orders of the defined types were placed in this encounter.    Follow-up: 2-3 week follow up with Dr. Sloan Leiter.   Please make sure to arrive 15 minutes prior to your next appointment. If you arrive late, you may be asked to reschedule.   We look forward to seeing you next time. Please call our clinic at [phone] if you have any questions or concerns. The best time to call is Monday-Friday from 9am-4pm, but there is someone available 24/7. If after hours or the weekend, call the main hospital number and ask for the Internal Medicine Resident On-Call. If you need medication refills, please notify your pharmacy one week in advance and they will send [place] a request.  Thank you for letting us take part in your care. Wishing you the best!  Thank you, [name], MD

## 2023-09-10 ENCOUNTER — Telehealth: Payer: Self-pay | Admitting: Student

## 2023-09-10 ENCOUNTER — Other Ambulatory Visit: Payer: Self-pay

## 2023-09-10 ENCOUNTER — Emergency Department (HOSPITAL_BASED_OUTPATIENT_CLINIC_OR_DEPARTMENT_OTHER)
Admission: EM | Admit: 2023-09-10 | Discharge: 2023-09-10 | Disposition: A | Discharge status: Home / Self Care | Payer: Medicaid Other | Attending: Emergency Medicine | Admitting: Emergency Medicine

## 2023-09-10 ENCOUNTER — Encounter (HOSPITAL_BASED_OUTPATIENT_CLINIC_OR_DEPARTMENT_OTHER): Payer: Self-pay | Admitting: Emergency Medicine

## 2023-09-10 DIAGNOSIS — I10 Essential (primary) hypertension: Secondary | ICD-10-CM | POA: Diagnosis not present

## 2023-09-10 DIAGNOSIS — Z8507 Personal history of malignant neoplasm of pancreas: Secondary | ICD-10-CM | POA: Insufficient documentation

## 2023-09-10 DIAGNOSIS — Z91041 Radiographic dye allergy status: Secondary | ICD-10-CM

## 2023-09-10 DIAGNOSIS — R7989 Other specified abnormal findings of blood chemistry: Secondary | ICD-10-CM | POA: Diagnosis present

## 2023-09-10 DIAGNOSIS — E876 Hypokalemia: Secondary | ICD-10-CM | POA: Insufficient documentation

## 2023-09-10 LAB — POTASSIUM: Potassium: 3.2 mmol/L — ABNORMAL LOW (ref 3.5–5.1)

## 2023-09-10 MED ORDER — POTASSIUM CHLORIDE CRYS ER 20 MEQ PO TBCR: 20.0000 meq | EXTENDED_RELEASE_TABLET | Freq: Two times a day (BID) | ORAL | Status: DC

## 2023-09-10 MED ORDER — POTASSIUM CHLORIDE CRYS ER 20 MEQ PO TBCR
40.0000 meq | EXTENDED_RELEASE_TABLET | Freq: Once | ORAL | Status: AC
  Administered 2023-09-10: 40 meq via ORAL
  Filled 2023-09-10: qty 2

## 2023-09-10 MED ORDER — PREDNISONE 50 MG PO TABS: ORAL_TABLET | ORAL | 0 refills | Status: DC

## 2023-09-10 MED ORDER — DIPHENHYDRAMINE HCL 50 MG PO TABS: ORAL_TABLET | ORAL | 0 refills | Status: DC

## 2023-09-10 NOTE — Discharge Instructions (Addendum)
Take your potassium tablets twice a day for the next 10 days.  Look on the bottles.  If it is a 10 mill equivalent tablet, you need to take 2 at a time twice a day.  If it is a 20 mill equivalent tablet, take 1 tablet twice a day.

## 2023-09-10 NOTE — ED Triage Notes (Addendum)
Pt reports being sent by her PCP for low potassium. Potassium 2.7 at PCP. Denies s/s

## 2023-09-10 NOTE — Telephone Encounter (Signed)
Received after-hours page from [facility] laboratory about this patient with a critical lab result, serum potassium of 2.7.  Attempted calling patient on her listed phone and was unable to reach her.  However, was able to speak with Ms. Angelina Ok, patient's sister and [facility] RN.   This patient was seen earlier today at [facility] for abdominal pain associated with loose stools.  Was sent to [facility] for CT imaging; however, patient has contrast dye allergy and needs premedication.  Per Ms. Venita Sheffield, Ms Rochele Raring did not have premedications and was told her she will called back to reschedule the test.   I communicated the urgency of getting potassium supplementation.  She does not have potassium as one of her home medications.  Offered to send p.o. supplementation but patient reported ongoing losses with loose stools. She will go to [facility] or her closes ED for IV and PO supplementation.  I will send the doses of PO prednisone 1x dose of 50 mg Benadryl so patient has them when she is called for CT abdomen rescheduling. Precautions reviewed with RN [place], who know how to get in touch with [facility] after-hours pager.  Morene Crocker, MD

## 2023-09-10 NOTE — ED Provider Notes (Signed)
[place] EMERGENCY DEPARTMENT AT [facility] Provider Note   CSN: [id number] Arrival date & time: 09/10/23  0041     History  Chief Complaint  Patient presents with   Abnormal labs    Kathleen Stone is a 57 y.o. female.  The history is provided by the patient.  She has history of hypertension, hyperlipidemia, pancreatic cancer and was told to come in because potassium level was low at routine blood drawn yesterday.  Patient states that she feels fine.   Home Medications Prior to Admission medications   Medication Sig Start Date End Date Taking? Authorizing Provider  albuterol (VENTOLIN HFA) 108 (90 Base) MCG/ACT inhaler Inhale 2 puffs into the lungs every 6 (six) hours as needed for wheezing or shortness of breath. Patient not taking: Reported on 08/23/2023 02/15/23   Ladene Artist, MD  diphenhydrAMINE (BENADRYL) 50 MG tablet Take 50 mg 1 hour before CT with contrast 09/10/23   Morene Crocker, MD  lidocaine-prilocaine (EMLA) cream Apply 1 Application topically as needed. 08/08/22   Ladene Artist, MD  nitroGLYCERIN (NITROSTAT) 0.4 MG SL tablet Place 1 tablet (0.4 mg total) under the tongue every 5 (five) minutes as needed for chest pain. Patient not taking: Reported on 05/02/2023 11/01/21 04/11/25  Chauncey Mann, DO  predniSONE (DELTASONE) 50 MG tablet Take one table 13hrs, 7hrs, and 1hr before CT w contrast 09/10/23   Morene Crocker, MD      Allergies    Iohexol    Review of Systems   Review of Systems  All other systems reviewed and are negative.   Physical Exam Updated Vital Signs BP (!) 146/94   Pulse 62   Temp 98.4 F (36.9 C) (Oral)   Resp 16   LMP 02/07/2016 (Exact Date)   SpO2 100%  Physical Exam Vitals and nursing note reviewed.   57 year old female, resting comfortably and in no acute distress. Vital signs are significant for elevated blood pressure. Oxygen saturation is 100%, which is normal. Head is normocephalic and  atraumatic. PERRLA, EOMI. Lungs are clear without rales, wheezes, or rhonchi. Chest is nontender. Heart has regular rate and rhythm without murmur. Abdomen is soft, flat, nontender. Neurologic: Mental status is normal, moves all extremities equally.  ED Results / Procedures / Treatments   Labs (all labs ordered are listed, but only abnormal results are displayed) Labs Reviewed  POTASSIUM - Abnormal; Notable for the following components:      Result Value   Potassium 3.2 (*)    All other components within normal limits   Procedures Procedures    Medications Ordered in ED Medications  potassium chloride SA (KLOR-CON M) CR tablet 40 mEq (has no administration in time range)    ED Course/ Medical Decision Making/ A&P                                 Medical Decision Making Amount and/or Complexity of Data Reviewed Labs: ordered.  Risk Prescription drug management.   Hypokalemia.  I have reviewed her past records, and potassium on comprehensive metabolic panel yesterday was 2.7.  Previous potassium levels have been in the normal range but the lower end of the normal range.  I have reviewed her laboratory test results here and my interpretation is mild hypokalemia with potassium 3.2.  She is not on any diuretics, because of hypokalemia is not clear.  I have ordered a dose of  potassium chloride orally.  Patient states that she has tablets of potassium chloride at home and does not want a new prescription.  Have advised her to take 20 mEq twice a day for the next 10 days.  Final Clinical Impression(s) / ED Diagnoses Final diagnoses:  Hypokalemia    Rx / DC Orders ED Discharge Orders          Ordered    potassium chloride SA (KLOR-CON M) 20 MEQ tablet  2 times daily        09/10/23 0615              Dione Booze, MD 09/10/23 [phone]

## 2023-09-11 DIAGNOSIS — R7303 Prediabetes: Secondary | ICD-10-CM | POA: Insufficient documentation

## 2023-09-11 DIAGNOSIS — R109 Unspecified abdominal pain: Secondary | ICD-10-CM | POA: Insufficient documentation

## 2023-09-11 NOTE — Assessment & Plan Note (Signed)
Previous A1c 5.8. Repeat A1c added this visit with lab work to minimize blood draws. Repeat A1c was 4.9.

## 2023-09-11 NOTE — Assessment & Plan Note (Signed)
Pt with HTN. BP quite elevated here at 180/74. Given abdominal pain and report of diarrhea, will hold of starting antihypertensive to decrease risk of hypotension. Discussed with patient this needs to be addressed at follow up. Pt to schedule follow up in 2-3 weeks.

## 2023-09-11 NOTE — Assessment & Plan Note (Signed)
Given's patient's worsening abdominal pain and hx of Stage IV pancreatic cancer discussed getting palliative care team involved in patient's care. Pt agreeable. Referral to palliative care sent.

## 2023-09-11 NOTE — Assessment & Plan Note (Addendum)
Pt with worsening abdominal pain for 2 weeks. It is cramping nature and located in mid left quadrant and epigastric area. Severity 6/10. She has 2-3 bowel movements that are loose daily. No fevers, or chills. No hematochezia or melena. Does report nausea but no vomiting. No other sick contacts. Pt with hx of locally advanced pancreatic cancer that she received 17 cycles of chemotherapy and radiation therapy for. Ddx for her abdominal pain could be worsening of her pancreatic cancer with involvement of nearby vasculature. This is the most concerning and most likely given her hx so will get stat CBC/CMP and stat CTAP. Reassured pt is tolerating PO intake and has gained weighed over the last 2 months. Other ddx included gastroenteritis which is typically self resolving. Pt's worsening pain makes this less likely. Last infusion was 12/06 which makes chemotherapy related side effect less likely. Patient continues to smoke 1.5 ppd. Advised cessation. Return and ED precautions given.   Addendum: CMP shows normal liver function. It showed very low K at 2.7 which patient got repeated and was 3.2. Pt given oral K repletion. Imaging pending.

## 2023-09-12 ENCOUNTER — Encounter: Payer: Self-pay | Admitting: Oncology

## 2023-09-12 NOTE — Progress Notes (Signed)
 Internal Medicine Clinic Attending  Case discussed with the resident physician at the time of the visit.  We reviewed the patient's history, exam, and pertinent patient test results.  I agree with the assessment, diagnosis, and plan of care documented in the resident's note.

## 2023-09-13 ENCOUNTER — Ambulatory Visit: Payer: Self-pay

## 2023-09-13 NOTE — Patient Outreach (Signed)
  Care Coordination   Follow Up Visit Note   09/13/2023 Name: Kathleen Stone MRN: [id number] DOB: 03-16-1966  Kathleen Stone is a 57 y.o. year old female who sees Masters, Florentina Addison, DO for primary care. I spoke with  Kathleen Stone by phone today.  What matters to the patients health and wellness today?  Kathleen Stone reported that she is currently in good health. She indicated that she had experienced some stomach cramping previously, but she has not faced this issue today. On December 24th, she sought treatment in the emergency room due to low potassium levels. The medical staff advised her to take 20 meq of potassium twice daily. Kathleen Stone noted an improvement in her appetite, resulting in a gain of 15 pounds. Her sleep patterns remain inconsistent. She described her energy level as acceptable and mentioned that she is awaiting have a CT scan scheduled. I recommended that she continue to take her medication as prescribed and maintain her current health regimen.    Goals Addressed             This Visit's Progress    I want to eat more and and gain weight       Patient Goals/self care activities: -Patient/Caregiver will take medications as prescribed   -Patient/Caregiver will attend all scheduled provider appointments -Patient/Caregiver will call pharmacy for medication refills 3-7 days in advance of running out of medications -Patient/Caregiver will call provider office for new concerns or questions  -Patient/Caregiver will focus on medication adherence by taking medications as prescribed   Maintain a healthy weight Eat mall frequent meals through out the day if possible Stay hydrated Be observant of changes in bowel habits Choose foods that are easy to digest Eat protein rich food Continue with smoothies Use rubs to message her legs to help with pain Drink meal supplements Take potassium twice daily as prescribed         SDOH assessments and interventions completed:  No      Care Coordination Interventions:  Yes, provided   Interventions Today    Flowsheet Row Most Recent Value  Chronic Disease   Chronic disease during today's visit Other  [Mass Potssium]  General Interventions   General Interventions Discussed/Reviewed General Interventions Discussed  Nutrition Interventions   Nutrition Discussed/Reviewed Nutrition Discussed  Pharmacy Interventions   Pharmacy Dicussed/Reviewed Pharmacy Topics Discussed  Safety Interventions   Safety Discussed/Reviewed Safety Discussed        Follow up plan: Follow up call scheduled for 11/07/23  10 am    Encounter Outcome:  Patient Visit Completed   Juanell Fairly RN, BSN, [facility]  [facility], [facility]  Care Coordinator Phone: [phone]

## 2023-09-13 NOTE — Patient Instructions (Signed)
Visit Information  Thank you for taking time to visit with me today. Please don't hesitate to contact me if I can be of assistance to you.   Following are the goals we discussed today:   Goals Addressed             This Visit's Progress    I want to eat more and and gain weight       Patient Goals/self care activities: -Patient/Caregiver will take medications as prescribed   -Patient/Caregiver will attend all scheduled provider appointments -Patient/Caregiver will call pharmacy for medication refills 3-7 days in advance of running out of medications -Patient/Caregiver will call provider office for new concerns or questions  -Patient/Caregiver will focus on medication adherence by taking medications as prescribed   Maintain a healthy weight Eat mall frequent meals through out the day if possible Stay hydrated Be observant of changes in bowel habits Choose foods that are easy to digest Eat protein rich food Continue with smoothies Use rubs to message her legs to help with pain Drink meal supplements Take potassium twice daily as prescribed         Our next appointment is by telephone on 11/07/23 at 10 am  Please call the care guide team at [phone] if you need to cancel or reschedule your appointment.   If you are experiencing a Mental Health or Behavioral Health Crisis or need someone to talk to, please call 1-800-273-TALK (toll free, 24 hour hotline)  Patient verbalizes understanding of instructions and care plan provided today and agrees to view in MyChart. Active MyChart status and patient understanding of how to access instructions and care plan via MyChart confirmed with patient.     Juanell Fairly RN, BSN, [facility]  [facility], [facility]  Care Coordinator Phone: [phone]

## 2023-09-14 ENCOUNTER — Ambulatory Visit (HOSPITAL_BASED_OUTPATIENT_CLINIC_OR_DEPARTMENT_OTHER)
Admission: RE | Admit: 2023-09-14 | Discharge: 2023-09-14 | Disposition: A | Discharge status: Home / Self Care | Payer: Commercial Managed Care - HMO | Source: Ambulatory Visit | Attending: Student in an Organized Health Care Education/Training Program | Admitting: Student in an Organized Health Care Education/Training Program

## 2023-09-14 DIAGNOSIS — C259 Malignant neoplasm of pancreas, unspecified: Secondary | ICD-10-CM | POA: Insufficient documentation

## 2023-09-14 MED ORDER — IOHEXOL 300 MG/ML  SOLN
80.0000 mL | Freq: Once | INTRAMUSCULAR | Status: AC | PRN
  Administered 2023-09-14: 80 mL via INTRAVENOUS

## 2023-09-15 ENCOUNTER — Other Ambulatory Visit: Payer: Self-pay

## 2023-09-25 ENCOUNTER — Telehealth: Payer: Self-pay | Admitting: *Deleted

## 2023-09-25 NOTE — Telephone Encounter (Signed)
 Call from [facility] with [facility] who stated they received the referral and they will follow pt.

## 2023-10-01 ENCOUNTER — Ambulatory Visit: Payer: Medicaid Other | Admitting: Internal Medicine

## 2023-10-01 ENCOUNTER — Encounter: Payer: Self-pay | Admitting: Oncology

## 2023-10-01 ENCOUNTER — Encounter: Payer: Self-pay | Admitting: Internal Medicine

## 2023-10-01 VITALS — BP 151/68 | HR 61 | Temp 98.0°F | Ht 66.0 in | Wt 182.3 lb

## 2023-10-01 DIAGNOSIS — E876 Hypokalemia: Secondary | ICD-10-CM

## 2023-10-01 DIAGNOSIS — C251 Malignant neoplasm of body of pancreas: Secondary | ICD-10-CM | POA: Diagnosis not present

## 2023-10-01 DIAGNOSIS — R140 Abdominal distension (gaseous): Secondary | ICD-10-CM | POA: Diagnosis not present

## 2023-10-01 DIAGNOSIS — F22 Delusional disorders: Secondary | ICD-10-CM

## 2023-10-01 LAB — POCT URINE PREGNANCY: Preg Test, Ur: NEGATIVE

## 2023-10-01 NOTE — Progress Notes (Signed)
 Internal Medicine Clinic Attending  Case discussed with the resident at the time of the visit.  We reviewed the resident's history and exam and pertinent patient test results.  I agree with the assessment, diagnosis, and plan of care documented in the resident's note.

## 2023-10-01 NOTE — Progress Notes (Addendum)
 Subjective:  CC: electrolyte follow-up  HPI:  [name] is a 58 y.o. female with a past medical history locally advanced pancreatic cancer and pilocystic astrocytoma of midbrain who presents for follow-up on hypokalemia.  She presented to internal medicine clinic on December 23 with abdominal pain.  She was found to have hypokalemia at 2.7.  Stat abdominal CT was ordered with concerns that pain was secondary to known pancreatic cancer.  Abdominal CT showed a slight decrease in the size of the pancreatic body mass with persistent encasement of vasculature.  He was discharged from ED with potassium tablets 20 mEq twice daily for 10 days.  She does not recall if she took these medications.  She is concerned that she may be pregnant.  For the last several months she has had discerns about someone being in her bedroom and touching her.  She currently follows with Renda Overman and has also shared these concerns.  She denies suicidal ideation.  She also denies auditory or visual hallucinations.  Please see problem based assessment and plan for additional details.  Past Medical History:  Diagnosis Date   Brain tumor (benign) (HCC)    DJD (degenerative joint disease) of cervical spine    Galactorrhea of both breasts 2009   R. sided w/ benign papilloma excised in 4/09. L sided in 5/09   Hyperlipidemia    Hypertension    Hypokalemia    Pancreatic cancer (HCC)    Personal history of adenomatous colonic polyp    09/2007 - diminutive adenoma [facility])   Stroke [facility])    2021- brain tumor bx caused ? stroke-    Tobacco abuse    Unintentional weight loss 05/26/2022    Current Outpatient Medications on File Prior to Visit  Medication Sig Dispense Refill   albuterol  (VENTOLIN  HFA) 108 (90 Base) MCG/ACT inhaler Inhale 2 puffs into the lungs every 6 (six) hours as needed for wheezing or shortness of breath. (Patient not taking: Reported on 08/23/2023) 6.7 g 1   diphenhydrAMINE  (BENADRYL ) 50 MG  tablet Take 50 mg 1 hour before CT with contrast 1 tablet 0   lidocaine -prilocaine  (EMLA ) cream Apply 1 Application topically as needed. 30 g 0   nitroGLYCERIN  (NITROSTAT ) 0.4 MG SL tablet Place 1 tablet (0.4 mg total) under the tongue every 5 (five) minutes as needed for chest pain. (Patient not taking: Reported on 05/02/2023) 25 tablet 1   potassium chloride  SA (KLOR-CON  M) 20 MEQ tablet Take 1 tablet (20 mEq total) by mouth 2 (two) times daily.     No current facility-administered medications on file prior to visit.    Family History  Problem Relation Age of Onset   Heart failure Mother    Diabetes Mother    Lung cancer Father        dx 32s-80s   Breast cancer Maternal Aunt        d. 3   Pancreatic cancer Maternal Aunt        d. 69   Prostate cancer Maternal Uncle        d. 75   Pancreatic cancer Maternal Grandfather        d. 62s   Breast cancer Cousin        dx 50s; maternal female cousin   Stomach cancer Cousin        d. 53; maternal female cousin   Pancreatic cancer Cousin        d. 56s; maternal female cousin   Colon cancer  Neg Hx    Colon polyps Neg Hx    Esophageal cancer Neg Hx    Rectal cancer Neg Hx     Social History   Socioeconomic History   Marital status: Married    Spouse name: Not on file   Number of children: Not on file   Years of education: Not on file   Highest education level: Not on file  Occupational History   Occupation: unemployed - applied for disability  Tobacco Use   Smoking status: Every Day    Current packs/day: 1.50    Average packs/day: 1.5 packs/day for 36.0 years (54.0 ttl pk-yrs)    Types: Cigarettes   Smokeless tobacco: Never  Vaping Use   Vaping status: Former  Substance and Sexual Activity   Alcohol use: No    Alcohol/week: 0.0 standard drinks of alcohol   Drug use: No   Sexual activity: Yes    Partners: Female  Other Topics Concern   Not on file  Social History Narrative   Domestic Partner: female has emphysema    Smoked 2 packs/day for 20+ years, down to 1/2ppd now, willing to try to quit    Alcohol use-no   Drug use-no   Regular exercise-no   Social Drivers of Health   Financial Resource Strain: Medium Risk (08/21/2022)   Overall Financial Resource Strain (CARDIA)    Difficulty of Paying Living Expenses: Somewhat hard  Food Insecurity: No Food Insecurity (07/31/2022)   Hunger Vital Sign    Worried About Running Out of Food in the Last Year: Never true    Ran Out of Food in the Last Year: Never true  Transportation Needs: No Transportation Needs (07/31/2022)   PRAPARE - Administrator, Civil Service (Medical): No    Lack of Transportation (Non-Medical): No  Physical Activity: Not on file  Stress: Not on file  Social Connections: Not on file  Intimate Partner Violence: Not At Risk (07/31/2022)   Humiliation, Afraid, Rape, and Kick questionnaire    Fear of Current or Ex-Partner: No    Emotionally Abused: No    Physically Abused: No    Sexually Abused: No    Review of Systems: ROS negative except for what is noted on the assessment and plan.  Objective:   Vitals:   10/01/23 0846 10/01/23 0905  BP: (!) 155/66 (!) 151/68  Pulse: 62 61  Temp: 98 F (36.7 C)   TempSrc: Oral   SpO2: 100%   Weight: 182 lb 4.8 oz (82.7 kg)   Height: 5' 6 (1.676 m)     Physical Exam: Constitutional: well-appearing, in no acute distress Cardiovascular: regular rate and rhythm, no m/r/g Pulmonary/Chest: normal work of breathing on room air, lungs clear to auscultation bilaterally Abdominal: soft, non-tender, non-distended MSK: normal bulk and tone Neurological: alert & oriented x 3, normal gait Skin: warm and dry Psych: anxious   Assessment & Plan:  Pancreas cancer (HCC) History of locally advanced pancreatic cancer with encasement of vasculature.  She has undergone 17 cycles of chemotherapy and radiation.  Stat CT abdomen pelvis was completed in December with abdominal pain.  This  showed decrease size of mass with continued encasement of vasculature.  Port remains in place.  She has follow-up with her oncologist at the end of this month.    She continues to have daily abdominal pain.  This is not associated with eating and she has not noticed a pattern for when it occurs.  She describes pain as  a sharp stabbing pain to her mid abdomen.  She does not take anything for the pain and just deals with it.  She was referred to palliative care but has not establish care with them yet.  I shared concern that abdominal pain could be secondary to cancer.  She does have oxycodone  5 mg at home but has not tried them for her pain.  I encouraged her to try 1 dose next time and to call if this was helpful.  Her appetite remains improved.  She has gained about 12 pounds since office visit in December. P: Follow-up with oncology and establish care with palliative  [facility]) Patient expressing concerns about being pregnant today.  She has followed with Renda Overman since September for concerns including being pregnant and if someone was touching her at night.  She has not been sexually active in the last 2 years.  She was previously wearing tampons to prevent pregnancy but is no longer doing this.  She denies auditory and visual hallucinations.  She also denies suicidal ideation.  I talked with her about low likelihood of her being pregnant without recent sexual activity.  She expresses concern that spirits have impregnated her and she would like to have a pregnancy test done today.  She is not interested in a psychiatry referral at this time. P: Point-of-care urine pregnancy test Encouraged her to consider psychiatry referral Continue to follow with Bianca  Hypokalemia Potassium of 2.7 at recent office visit in December.  This has been an ongoing problem for her since July 2023.SABRA  She was given 10 days of potassium 20 mill equivalents twice daily.  She is unsure if she took those  medications.  She denies diarrhea and is concerned that supplement actually made her have diarrhea.  She is open to rechecking her electrolytes and would take supplementation if needed. P: Recheck BMP and magnesium     Patient discussed with Dr. Forest Pagan Gentri Guardado, D.O. [facility] Internal Medicine  PGY-3 Pager: [phone]  Phone: [phone] Date 10/01/2023  Time 9:48 AM

## 2023-10-01 NOTE — Assessment & Plan Note (Signed)
 Potassium of 2.7 at recent office visit in December.  This has been an ongoing problem for her since July 2023.Kathleen Stone  She was given 10 days of potassium 20 mill equivalents twice daily.  She is unsure if she took those medications.  She denies diarrhea and is concerned that supplement actually made her have diarrhea.  She is open to rechecking her electrolytes and would take supplementation if needed. P: Recheck BMP and magnesium 

## 2023-10-01 NOTE — Patient Instructions (Signed)
 Thank you, Kathleen Stone for allowing us  to provide your care today.   Electrolytes: I am rechecking your electrolytes today. If potassium is low, I will plan to send in packets rather than pills.  I ordered a pregnancy test today. Please continuing following up with Renda Pontes. I do think that you could benefit from talking with psychiatry as well. Please let me know if you would be open to that referral.  I have ordered the following labs for you:   Lab Orders         BMP8+Anion Gap         Magnesium          POCT Urine Pregnancy      I have ordered the following medication/changed the following medications:   Stop the following medications: Medications Discontinued During This Encounter  Medication Reason   predniSONE  (DELTASONE ) 50 MG tablet      Start the following medications: No orders of the defined types were placed in this encounter.    Follow up: 3 months   We look forward to seeing you next time. Please call our clinic at [phone] if you have any questions or concerns. The best time to call is Monday-Friday from 9am-4pm, but there is someone available 24/7. If after hours or the weekend, call the main hospital number and ask for the Internal Medicine Resident On-Call. If you need medication refills, please notify your pharmacy one week in advance and they will send us  a request.   Thank you for trusting me with your care. Wishing you the best!   Izetta Medley, DO [facility] Center

## 2023-10-01 NOTE — Assessment & Plan Note (Signed)
 Patient expressing concerns about being pregnant today.  She has followed with Renda Overman since September for concerns including being pregnant and if someone was touching her at night.  She has not been sexually active in the last 2 years.  She was previously wearing tampons to prevent pregnancy but is no longer doing this.  She denies auditory and visual hallucinations.  She also denies suicidal ideation.  I talked with her about low likelihood of her being pregnant without recent sexual activity.  She expresses concern that spirits have impregnated her and she would like to have a pregnancy test done today.  She is not interested in a psychiatry referral at this time. P: Point-of-care urine pregnancy test Encouraged her to consider psychiatry referral Continue to follow with Bianca

## 2023-10-01 NOTE — Assessment & Plan Note (Addendum)
 History of locally advanced pancreatic cancer with encasement of vasculature.  She has undergone 17 cycles of chemotherapy and radiation.  Stat CT abdomen pelvis was completed in December with abdominal pain.  This showed decrease size of mass with continued encasement of vasculature.  Port remains in place.  She has follow-up with her oncologist at the end of this month.    She continues to have daily abdominal pain.  This is not associated with eating and she has not noticed a pattern for when it occurs.  She describes pain as a sharp stabbing pain to her mid abdomen.  She does not take anything for the pain and just deals with it.  She was referred to palliative care but has not establish care with them yet.  I shared concern that abdominal pain could be secondary to cancer.  She does have oxycodone  5 mg at home but has not tried them for her pain.  I encouraged her to try 1 dose next time and to call if this was helpful.  Her appetite remains improved.  She has gained about 12 pounds since office visit in December. P: Follow-up with oncology and establish care with palliative

## 2023-10-02 ENCOUNTER — Encounter: Payer: Self-pay | Admitting: Oncology

## 2023-10-02 ENCOUNTER — Ambulatory Visit (INDEPENDENT_AMBULATORY_CARE_PROVIDER_SITE_OTHER): Payer: Medicaid Other | Admitting: Licensed Clinical Social Worker

## 2023-10-02 DIAGNOSIS — F22 Delusional disorders: Secondary | ICD-10-CM

## 2023-10-02 LAB — BMP8+ANION GAP
Anion Gap: 14 mmol/L (ref 10.0–18.0)
BUN/Creatinine Ratio: 14 (ref 9–23)
BUN: 10 mg/dL (ref 6–24)
CO2: 25 mmol/L (ref 20–29)
Calcium: 9.6 mg/dL (ref 8.7–10.2)
Chloride: 104 mmol/L (ref 96–106)
Creatinine, Ser: 0.73 mg/dL (ref 0.57–1.00)
Glucose: 85 mg/dL (ref 70–99)
Potassium: 3.6 mmol/L (ref 3.5–5.2)
Sodium: 143 mmol/L (ref 134–144)
eGFR: 95 mL/min/{1.73_m2} (ref >59)

## 2023-10-02 LAB — MAGNESIUM: Magnesium: 2 mg/dL (ref 1.6–2.3)

## 2023-10-02 NOTE — BH Specialist Note (Signed)
 Integrated Behavioral Health Follow Up In-Person Visit  MRN: [id number] Name: Kathleen Stone  Number of Integrated Behavioral Health Clinician visits: Additional Visit  Session Start time: 1430   Session End time: 1530  Total time in minutes: 60   Types of Service: Individual psychotherapy  Interpretor:No. Interpretor Name and Language: N/A  Subjective: Kathleen Stone is a 58 y.o. female  Patient was referred by PCP for Paranoia. Patient reports the following symptoms/concerns: Patient and [facility] discussed patient being discharged. Patient has upcoming appointment at [facility] on February 10th at 1pm. Patient agrees with plan and to continue services with [facility].  Plan: Follow up with behavioral health clinician on : Patient will follow up with [facility] on 02/10 and reach out to [facility] if she has any issues or concerns.  Amie Bald, MSW, LCSW-A She/Her Behavioral Health Clinician [facility]  Internal Medicine Center Direct [phone]  Fax [phone] Main Office Phone: [phone] [address] [place] [place], [place] [address] Website: [facility] Internal Medicine [facility] [place]  [place], [place]  [place]

## 2023-10-08 ENCOUNTER — Encounter: Payer: Self-pay | Admitting: Oncology

## 2023-10-11 ENCOUNTER — Inpatient Hospital Stay: Payer: Medicaid Other

## 2023-10-11 ENCOUNTER — Inpatient Hospital Stay (HOSPITAL_BASED_OUTPATIENT_CLINIC_OR_DEPARTMENT_OTHER): Payer: Medicaid Other | Admitting: Oncology

## 2023-10-11 ENCOUNTER — Inpatient Hospital Stay: Payer: Medicaid Other | Attending: Oncology

## 2023-10-11 VITALS — BP 150/79 | HR 61 | Temp 98.1°F | Resp 18 | Ht 66.0 in | Wt 186.3 lb

## 2023-10-11 DIAGNOSIS — Z8042 Family history of malignant neoplasm of prostate: Secondary | ICD-10-CM | POA: Diagnosis not present

## 2023-10-11 DIAGNOSIS — R109 Unspecified abdominal pain: Secondary | ICD-10-CM | POA: Diagnosis not present

## 2023-10-11 DIAGNOSIS — Z860100 Personal history of colon polyps, unspecified: Secondary | ICD-10-CM | POA: Diagnosis not present

## 2023-10-11 DIAGNOSIS — R197 Diarrhea, unspecified: Secondary | ICD-10-CM | POA: Diagnosis not present

## 2023-10-11 DIAGNOSIS — Z800 Family history of malignant neoplasm of digestive organs: Secondary | ICD-10-CM | POA: Insufficient documentation

## 2023-10-11 DIAGNOSIS — R630 Anorexia: Secondary | ICD-10-CM | POA: Insufficient documentation

## 2023-10-11 DIAGNOSIS — C251 Malignant neoplasm of body of pancreas: Secondary | ICD-10-CM

## 2023-10-11 DIAGNOSIS — Z803 Family history of malignant neoplasm of breast: Secondary | ICD-10-CM | POA: Diagnosis not present

## 2023-10-11 DIAGNOSIS — Z801 Family history of malignant neoplasm of trachea, bronchus and lung: Secondary | ICD-10-CM | POA: Diagnosis not present

## 2023-10-11 DIAGNOSIS — R634 Abnormal weight loss: Secondary | ICD-10-CM | POA: Insufficient documentation

## 2023-10-11 NOTE — Progress Notes (Signed)
[facility] OFFICE PROGRESS NOTE   Diagnosis: Pancreas cancer  INTERVAL HISTORY:   Ms. Kawashima returns as scheduled.  She was seen by her primary provider for abdominal pain in December.  She reports intermittent postprandial cramping discomfort.  She had associated loose stool.  She was treated for hypokalemia.  A CT abdomen/pelvis revealed no acute finding and a decrease in the pancreas mass.  She reports a good appetite.  She has gained weight.  She went to the beach last week.  Objective:  Vital signs in last 24 hours:  Blood pressure (!) 150/79, pulse 61, temperature 98.1 F (36.7 C), temperature source Temporal, resp. rate 18, height 5\' 6"  (1.676 m), weight 186 lb 4.8 oz (84.5 kg), last menstrual period 02/07/2016, SpO2 100%.  Lymphatics: No cervical, supraclavicular, axillary, or inguinal nodes Resp: Lungs clear bilaterally Cardio: Regular rate and rhythm GI: No hepatosplenomegaly, no mass, no apparent ascites Vascular: No leg edema   Portacath/PICC-without erythema  Lab Results:  Lab Results  Component Value Date   WBC 10.8 (H) 09/09/2023   HGB 13.6 09/09/2023   HCT 42.5 09/09/2023   MCV 90.6 09/09/2023   PLT 152 09/09/2023   NEUTROABS 8.1 (H) 09/09/2023    CMP  Lab Results  Component Value Date   NA 143 10/01/2023   K 3.6 10/01/2023   CL 104 10/01/2023   CO2 25 10/01/2023   GLUCOSE 85 10/01/2023   BUN 10 10/01/2023   CREATININE 0.73 10/01/2023   CALCIUM 9.6 10/01/2023   PROT 6.8 09/09/2023   ALBUMIN 3.5 09/09/2023   AST 18 09/09/2023   ALT 12 09/09/2023   ALKPHOS 109 09/09/2023   BILITOT 0.8 09/09/2023   GFRNONAA >60 09/09/2023   GFRAA >60 02/29/2020    Lab Results  Component Value Date   CEA1 27.4 (H) 07/31/2022   ZOX096 33 08/23/2023    Lab Results  Component Value Date   INR 1.0 07/31/2022   LABPROT 13.1 07/31/2022    Imaging:  No results found.  Medications: I have reviewed the patient's current  medications.   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 CT abdomen/pelvis 07/30/2022-body/neck pancreas mass with peripheral pancreatic atrophy and duct dilation, suspected encasement of the celiac axis, uterine fibroids MRI abdomen 07/30/2022-3.7 x 4.7 cm pancreas body/tail mass with encasement of the proximal celiac artery, occlusion with collateralization at the confluence of the SMV and portal vein, no evidence of metastatic disease CT chest 08/01/2022-no intrathoracic metastases Elevated CA 19-9 EUS 08/02/2022-gastric polyp, gastric erosions, 32 x 33 mm pancreas body mass, invasion of the celiac trunk, invasion of the splenoportal confluence, no SMA invasion, 2 malignant appearing nodes in the porta hepatis,T4N1 by endoscopic criteria, FNA biopsy-adenocarcinoma 08/17/2022 Guardant360-tumor mutation burden 0.96; MSI high not detected; variant of uncertain clinical significance NTRK3 T93M Cycle 1 gemcitabine/Abraxane 08/17/2022 Cycle 2 gemcitabine/Abraxane 08/31/2022 Cycle 3 gemcitabine/Abraxane 09/14/2022 Cycle 4 gemcitabine/Abraxane 09/28/2022 Cycle 5 gemcitabine/Abraxane 10/12/2022 Cycle 6 gemcitabine/Abraxane 10/26/2022 CT abdomen/pelvis 10/27/2022-stable pancreas body mass with encasement of the celiac axis and portal vein with occlusion of the splenic vein and varices.  Stable porta hepatis lymph nodes. Cycle 7 gemcitabine/Abraxane 11/09/2022 Cycle 8 gemcitabine/Abraxane 11/23/2022 Cycle 9 gemcitabine/Abraxane 12/07/2022 Cycle 10 gemcitabine/Abraxane 12/21/2022 Cycle 11 gemcitabine/Abraxane 01/04/2023 Cycle 12 gemcitabine/Abraxane 01/18/2023 Cycle 13 gemcitabine/Abraxane 02/01/2023 Cycle 14 gemcitabine/Abraxane 02/15/2023 CT abdomen/pelvis 02/26/2023-stable appearance of pancreas body mass with vascular encasement and chronic occlusion of the splenic vein, no evidence of metastatic disease Cycle 15 gemcitabine/Abraxane 03/01/2023 Cycle 16 gemcitabine/Abraxane 03/15/2023 Cycle 17 Gemcitabine/Abraxane  03/29/2023 EUS  pancreas fiducial marker placement 04/17/2023 Pancreas SBRT 05/07/2023 - 05/17/2023, 33 Gray in 5 fractions CT abdomen/pelvis 09/14/2023: Slight decrease in pancreas body mass with persistent vascular encasement, no evidence of metastatic disease    2.  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  Anorexia/weight loss secondary to #1 4.  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  Abdominal pain 6.  History of colon polyps 7.  Family history of multiple cancers Pancreas cancer maternal aunt, maternal cousin, maternal grandfather Breast cancer-maternal aunt, maternal cousin Prostate cancer-maternal uncle Lung cancer-father 8.  Hypertension 9.  Postmenopausal vaginal bleeding 10.  H. pylori 08/02/2022-amoxicillin and Flagyl discontinued 08/21/2022 due to GI symptoms 11.  Syncope event 03/07/2023    Disposition: Ms. Helser appears unchanged.  There is no clinical evidence for progression of the pancreas cancer.  The etiology of the intermittent postprandial cramping abdominal discomfort is unclear.  A restaging CT last month revealed no evidence of progressive pancreas cancer.  She will call for consistent or worsening abdominal pain.  The plan is to continue observation.  A Port-A-Cath remains in place.  She will return for a Port-A-Cath flush in 8 weeks and an office visit in 16 weeks.  We will follow-up on the CA 19-9 from today.    Thornton Papas, MD  10/11/2023  10:46 AM

## 2023-10-12 ENCOUNTER — Other Ambulatory Visit: Payer: Self-pay

## 2023-10-12 LAB — CANCER ANTIGEN 19-9: CA 19-9: 139 U/mL — ABNORMAL HIGH (ref 0–35)

## 2023-10-17 ENCOUNTER — Other Ambulatory Visit: Payer: Self-pay

## 2023-10-17 ENCOUNTER — Telehealth: Payer: Self-pay

## 2023-10-17 ENCOUNTER — Other Ambulatory Visit: Payer: Self-pay | Admitting: Internal Medicine

## 2023-10-17 DIAGNOSIS — C251 Malignant neoplasm of body of pancreas: Secondary | ICD-10-CM

## 2023-10-17 NOTE — Telephone Encounter (Signed)
The patient has been informed, and I have sent a message to the scheduler to arrange a lab and office visit with [facility] for the patient.

## 2023-10-17 NOTE — Telephone Encounter (Signed)
-----   Message from Thornton Papas sent at 10/13/2023  8:13 AM EST ----- Please call patient, ca19-9 is mildly elevated, schedule repeat with office visit 4-6 weeks, call for increased abdominal pain

## 2023-10-28 ENCOUNTER — Ambulatory Visit (INDEPENDENT_AMBULATORY_CARE_PROVIDER_SITE_OTHER): Payer: Commercial Managed Care - HMO | Admitting: Clinical

## 2023-10-28 ENCOUNTER — Encounter (HOSPITAL_COMMUNITY): Payer: Self-pay

## 2023-10-28 DIAGNOSIS — F29 Unspecified psychosis not due to a substance or known physiological condition: Secondary | ICD-10-CM | POA: Diagnosis not present

## 2023-10-30 NOTE — Progress Notes (Signed)
 Comprehensive Clinical Assessment (CCA) Note  10/28/2023 Kathleen Stone [id number]  Chief Complaint:  Chief Complaint  Patient presents with   Anxiety   Delusional   Visit Diagnosis:   Psychosis, unspecified psychosis type  Interpretive Summary:  Client is a 58 year old female presenting to the [facility] health center for outpatient services. Client reported her counselor wanted her to have another level of therapy care. Client reported she told her therapist at night she felt like someone was trying to get her pregnant. Client reported at night or daytime when laying on the couch she knows someone is not there but feels someone is moving around. Client reported some irritability, as she gets mad at God. Client reported these symptoms have been going on for a year. Client reported before she got separated, she would feel something in the sheets, but no one was there. Client reported she had her doctor to administer a pregnancy test which came back negative. Client reported she is diagnosed with pancreatic cancer. Client reported in 2017 they found a brain tumor but did not remove it since it was benign. Client reported having complications after the biopsy and spent a week in [facility]. Client reported she finished chemo treatments and the cancer has not spread. Client reported she had a therapist in 2022 when she and her wife separated. Client reported no other history of illicit substance use. Client presented oriented times five, appropriately dressed and friendly. Client reported AVH. Client denied suicidal and homicidal ideations. Client was screened for pain, nutrition, [place] suicide severity and the following SDOH:    10/28/2023    1:25 PM 11/01/2021    9:46 AM  GAD 7 : Generalized Anxiety Score  Nervous, Anxious, on Edge 3 3  Control/stop worrying 3 2  Worry too much - different things 3 3  Trouble relaxing 3 3  Restless 3 2  Easily annoyed or  irritable 3 3  Afraid - awful might happen 3 3  Total GAD 7 Score 21 19  Anxiety Difficulty Very difficult      Tx Recommendations: individual therapy. Client denied wanting to be seen by a psychiatrist for medication management   CCA Biopsychosocial Intake/Chief Complaint:  client reported her previous counselor via  internal medicine wanted her to have another level of therapy care.  Current Symptoms/Problems: client reported anxiety, some irritability  Patient Reported Schizophrenia/Schizoaffective Diagnosis in Past: No  Strengths: voluntarily engaging in services  Preferences: counseling  Abilities: describe problems and resources to help  Type of Services Patient Feels are Needed: individual therapy  Initial Clinical Notes/Concerns: No data recorded  Mental Health Symptoms Depression:  None   Duration of Depressive symptoms: No data recorded  Mania:  None   Anxiety:   Tension; Worrying   Psychosis:  None   Duration of Psychotic symptoms: No data recorded  Trauma:  None   Obsessions:  None   Compulsions:  None   Inattention:  None   Hyperactivity/Impulsivity:  None   Oppositional/Defiant Behaviors:  None   Emotional Irregularity:  None   Other Mood/Personality Symptoms:  No data recorded   Mental Status Exam Appearance and self-care  Stature:  Average   Weight:  Average weight   Clothing:  Casual   Grooming:  Normal   Cosmetic use:  Age appropriate   Posture/gait:  Normal   Motor activity:  Not Remarkable   Sensorium  Attention:  Normal   Concentration:  Normal   Orientation:  X5   Recall/memory:  Normal   Affect and Mood  Affect:  Appropriate   Mood:  Euthymic   Relating  Eye contact:  Normal   Facial expression:  Responsive   Attitude toward examiner:  Cooperative   Thought and Language  Speech flow: Clear and Coherent   Thought content:  Appropriate to Mood and Circumstances   Preoccupation:  None    Hallucinations:  Tactile   Organization:  No data recorded  [employer] of Knowledge:  Fair   Intelligence:  Average   Abstraction:  Normal   Judgement:  Good   Reality Testing:  Adequate   Insight:  Fair   Decision Making:  Normal   Social Functioning  Social Maturity:  Responsible   Social Judgement:  Normal   Stress  Stressors:  Family conflict   Coping Ability:  Resilient   Skill Deficits:  Activities of daily living   Supports:  Family     Religion: Religion/Spirituality Are You A Religious Person?: No  Leisure/Recreation: Leisure / Recreation Do You Have Hobbies?: No  Exercise/Diet: Exercise/Diet Do You Exercise?: No Have You Gained or Lost A Significant Amount of Weight in the Past Six Months?: No Do You Follow a Special Diet?: No Do You Have Any Trouble Sleeping?: Yes   CCA Employment/Education Employment/Work Situation: Employment / Work Situation Employment Situation: On disability Why is Patient on Disability: health related reasons  Education: Education Did Garment/textile technologist From [employer]?: Yes Did Theme park manager?: Yes What Type of College Degree Do you Have?: client reported 3 years of college via [employer] ,online schooling.   CCA Family/Childhood History Family and Relationship History: Family history Marital status: Single Does patient have children?: Yes How many children?: 1 How is patient's relationship with their children?: client reported she has a 36 year old daughter. client reported they do not have a close relationship. client reported she is about to graduate college.  Childhood History:  Childhood History By whom was/is the patient raised?: Other (Comment) Additional childhood history information: client reported she is from [place]. client reported she and her siblings were raised by extended family cousins and her older sister. Does patient have siblings?: Yes Number of Siblings: 7 Description of  patient's current relationship with siblings: client reported they all keep in contact. Did patient suffer any verbal/emotional/physical/sexual abuse as a child?: No Did patient suffer from severe childhood neglect?: No Has patient ever been sexually abused/assaulted/raped as an adolescent or adult?: No Was the patient ever a victim of a crime or a disaster?: No Witnessed domestic violence?: No Has patient been affected by domestic violence as an adult?: No  Child/Adolescent Assessment:     CCA Substance Use Alcohol/Drug Use: Alcohol / Drug Use History of alcohol / drug use?: No history of alcohol / drug abuse                         ASAM's:  Six Dimensions of Multidimensional Assessment  Dimension 1:  Acute Intoxication and/or Withdrawal Potential:      Dimension 2:  Biomedical Conditions and Complications:      Dimension 3:  Emotional, Behavioral, or Cognitive Conditions and Complications:     Dimension 4:  Readiness to Change:     Dimension 5:  Relapse, Continued use, or Continued Problem Potential:     Dimension 6:  Recovery/Living Environment:     ASAM Severity Score:    ASAM Recommended Level of Treatment:  Substance use Disorder (SUD)    Recommendations for Services/Supports/Treatments: Recommendations for Services/Supports/Treatments Recommendations For Services/Supports/Treatments: Individual Therapy  DSM5 Diagnoses: Patient Active Problem List   Diagnosis Date Noted   Paranoia (HCC) 10/01/2023   Prediabetes 09/11/2023   Healthcare maintenance 03/13/2023   Genetic testing 08/27/2022   Pancreas cancer (HCC) 08/03/2022   Nausea and vomiting 08/01/2022   Protein-calorie malnutrition, severe 08/01/2022   Fatigue 05/02/2022   Hypokalemia 04/07/2022   Post-menopausal bleeding 04/28/2021   Pilocytic astrocytoma (HCC) 02/26/2020   Headache 02/24/2020   Hematuria, microscopic 02/24/2020   Hyperlipidemia 05/21/2019   Abnormal stress test 01/16/2017    Shortness of breath 11/26/2016   Tingling of left upper extremity 11/15/2016   Essential hypertension 06/13/2016   Right knee pain 02/16/2016   Lumbar radiculopathy, chronic 01/04/2016   History of colonic polyps 08/17/2012   Preventative health care 07/24/2012   Anxiety and depression 08/23/2011   DEGENERATIVE DISC DISEASE, 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   Disturbance in sleep behavior 07/28/2010   LOW BACK PAIN SYNDROME 07/10/2007   Tobacco abuse 10/07/2006    Patient Centered Plan: Patient is on the following Treatment Plan(s):  Anxiety   Referrals to Alternative Service(s): Referred to Alternative Service(s):   Place:   Date:   Time:    Referred to Alternative Service(s):   Place:   Date:   Time:    Referred to Alternative Service(s):   Place:   Date:   Time:    Referred to Alternative Service(s):   Place:   Date:   Time:      Collaboration of Care: Referral or follow-up with counselor/therapist [facility]  Patient/Guardian was advised Release of Information must be obtained prior to any record release in order to collaborate their care with an outside provider. Patient/Guardian was advised if they have not already done so to contact the registration department to sign all necessary forms in order for [place] to release information regarding their care.   Consent: Patient/Guardian gives verbal consent for treatment and assignment of benefits for services provided during this visit. Patient/Guardian expressed understanding and agreed to proceed.   Neena Rhymes Nakai Pollio, LCSW

## 2023-10-31 ENCOUNTER — Other Ambulatory Visit: Payer: Self-pay

## 2023-11-07 ENCOUNTER — Ambulatory Visit: Payer: Self-pay

## 2023-11-07 NOTE — Patient Instructions (Signed)
Visit Information  Thank you for taking time to visit with me today. Please don't hesitate to contact me if I can be of assistance to you.   Following are the goals we discussed today:   Goals Addressed             This Visit's Progress    I want to eat more and and gain weight       Patient Goals/self care activities: -Patient/Caregiver will take medications as prescribed   -Patient/Caregiver will attend all scheduled provider appointments -Patient/Caregiver will call pharmacy for medication refills 3-7 days in advance of running out of medications -Patient/Caregiver will call provider office for new concerns or questions  -Patient/Caregiver will focus on medication adherence by taking medications as prescribed   Maintain a healthy weight Eat mall frequent meals through out the day if possible Stay hydrated Be observant of changes in bowel habits Eat protein rich food Continue with smoothies Use rubs to message her legs to help with pain Continue with your current regimen          Our next appointment is by telephone on 12/05/23 at 1030 am  Please call the care guide team at [phone] if you need to cancel or reschedule your appointment.   If you are experiencing a Mental Health or Behavioral Health Crisis or need someone to talk to, please call 1-800-273-TALK (toll free, 24 hour hotline)  Patient verbalizes understanding of instructions and care plan provided today and agrees to view in MyChart. Active MyChart status and patient understanding of how to access instructions and care plan via MyChart confirmed with patient.     Juanell Fairly RN, BSN, [facility]  [facility], [facility]  Care Coordinator Phone: [phone]

## 2023-11-07 NOTE — Patient Outreach (Signed)
Care Coordination   Follow Up Visit Note   11/07/2023 Name: Kathleen Stone MRN: [id number] DOB: 12/26/1965  Kathleen Stone is a 58 y.o. year old female who sees Masters, Florentina Addison, DO for primary care. I spoke with  Kathleen Stone by phone today.  What matters to the patients health and wellness today?  Kathleen Stone indicated that she has been maintaining a satisfactory state of health. Her blood pressure readings have been approximately 150/79, and her weight currently stands at 186 pounds. She has an appointment scheduled for tomorrow with Dr. Truett Perna to review the results of her abdominal CT scan, and she is looking forward to receiving this information.  She reported that her dietary intake has been acceptable, she has not experienced any pain, her sleep quality has improved, she retains her strength, and she is able to consume fluids without difficulty. Kathleen Stone expressed her intention to communicate the outcomes of the CT scan or, alternatively, she would appreciate my assistance in reviewing the details from her doctor's visit tomorrow. I will follow up with her in one month.     Goals Addressed             This Visit's Progress    I want to eat more and and gain weight       Patient Goals/self care activities: -Patient/Caregiver will take medications as prescribed   -Patient/Caregiver will attend all scheduled provider appointments -Patient/Caregiver will call pharmacy for medication refills 3-7 days in advance of running out of medications -Patient/Caregiver will call provider office for new concerns or questions  -Patient/Caregiver will focus on medication adherence by taking medications as prescribed   Maintain a healthy weight Eat mall frequent meals through out the day if possible Stay hydrated Be observant of changes in bowel habits Eat protein rich food Continue with smoothies Use rubs to message her legs to help with pain Continue with your current regimen           SDOH assessments and interventions completed:  No     Care Coordination Interventions:  Yes, provided   Interventions Today    Flowsheet Row Most Recent Value  Chronic Disease   Chronic disease during today's visit Other  [Mass]  General Interventions   General Interventions Discussed/Reviewed General Interventions Discussed, General Interventions Reviewed  Nutrition Interventions   Nutrition Discussed/Reviewed Nutrition Discussed  Pharmacy Interventions   Pharmacy Dicussed/Reviewed Pharmacy Topics Discussed  Safety Interventions   Safety Discussed/Reviewed Safety Discussed           Follow up plan: Follow up call scheduled for 12/05/23  1030 am    Encounter Outcome:  Patient Visit Completed   Juanell Fairly RN, BSN, [facility]  [facility], [facility]  Care Coordinator Phone: [phone]

## 2023-11-18 ENCOUNTER — Telehealth (HOSPITAL_COMMUNITY): Payer: Self-pay

## 2023-11-18 NOTE — Telephone Encounter (Signed)
 Hello,   Pt's sister called saying that she wants to speak with provider regarding Scheduled appointment tomorrow. Sister states the Pt is nervous and concerned and that they feel the sister should be evaluated, states she isn't doing her usual things throughout daily activities . Please reach out when free !

## 2023-11-19 ENCOUNTER — Other Ambulatory Visit: Payer: Self-pay

## 2023-11-19 ENCOUNTER — Ambulatory Visit (INDEPENDENT_AMBULATORY_CARE_PROVIDER_SITE_OTHER): Payer: Commercial Managed Care - HMO | Admitting: Clinical

## 2023-11-19 DIAGNOSIS — F22 Delusional disorders: Secondary | ICD-10-CM

## 2023-11-19 NOTE — Progress Notes (Signed)
   THERAPIST PROGRESS NOTE  Session Time: 16 minutes  Participation Level: Active  Behavioral Response: CasualAlertEuthymic  Type of Therapy: Individual Therapy  Treatment Goals addressed: client will engage in at least 80% of scheduled individual psychotherapy sessions  ProgressTowards Goals: Not Progressing  Interventions: CBT  Summary:  HANNY ELSBERRY is a 58 y.o. female who presents for the scheduled appointment oriented times five, appropriately dressed and friendly. Client reported AVH. Client reported she has been feeling some anxiety and uneasiness. Client reported when she has tried to lay down and rest it feels like something is moving on the bed. Client reported it feels like something the size of a horse. Client reported she does not talk to her sister about it because they think she is crazy already. Client reported she is able to get a few hours of sleep however. Client reported she has noticed her appetite is lacking. Client reported even her favorite meal she has not been able to eat more than a few bites. Client reported she does not know if it is from the cancer. Client reported she meets with her endocrinologist later this month. Client reported she does not taken medications for the cancer and decided to stop that long ago. Client reported she met a woman friend on facebook that she wants to hang out with. Client reported the woman is rich and lives in [place]. Client reported her sister expressed reservations for her going to see the friend unless she met who the people are. Client reported she does not like that because she is grown and will do it anyway. Evidence of progress towards goal:  client reported 1 negative of continuing to experience AVH.  Suicidal/Homicidal: Nowithout intent/plan  Therapist Response:  Therapist began the appointment asking the client how she has been doing. Therapist used cbt to engage with active listening and positive emotional  support. Therapist used cbt to engage and ask the client to describe her mood and triggers. Therapist used cbt to engage and teach healthy coping skills. Therapist used CBT ask the client to identify her progress with frequency of use with coping skills with continued practice in her daily activity.    Therapist assigned the client homework to practice self care.   Plan: Return again in 4 weeks.  Diagnosis: paranoia   Collaboration of Care: Patient refused AEB none requested by the client.  Patient/Guardian was advised Release of Information must be obtained prior to any record release in order to collaborate their care with an outside provider. Patient/Guardian was advised if they have not already done so to contact the registration department to sign all necessary forms in order for [place] to release information regarding their care.   Consent: Patient/Guardian gives verbal consent for treatment and assignment of benefits for services provided during this visit. Patient/Guardian expressed understanding and agreed to proceed.   Neena Rhymes Shawntrice Salle, LCSW 11/19/2023

## 2023-11-27 ENCOUNTER — Other Ambulatory Visit: Payer: Self-pay | Admitting: Internal Medicine

## 2023-11-27 DIAGNOSIS — C719 Malignant neoplasm of brain, unspecified: Secondary | ICD-10-CM

## 2023-11-27 NOTE — Progress Notes (Signed)
 Ms. Diehl is a 58 year old with history of pilocytic astrocytoma diagnosed in 2021.  She has been experiencing personality changes and was evaluated psychiatry who requested repeat MRI.  She was to be following with [facility] neurology for yearly MRIs which she has not had since 2021. P: MRI brain with without contrast Referral sent to neurosurgery at [facility], Dr. Lynwood Dawley

## 2023-12-05 ENCOUNTER — Ambulatory Visit: Payer: Self-pay

## 2023-12-05 NOTE — Patient Instructions (Signed)
 Visit Information  Thank you for taking time to visit with me today. Please don't hesitate to contact me if I can be of assistance to you.   Following are the goals we discussed today:   Goals Addressed             This Visit's Progress    I want to eat more and and gain weight       Patient Goals/self care activities: -Patient/Caregiver will take medications as prescribed   -Patient/Caregiver will attend all scheduled provider appointments -Patient/Caregiver will call pharmacy for medication refills 3-7 days in advance of running out of medications -Patient/Caregiver will call provider office for new concerns or questions  -Patient/Caregiver will focus on medication adherence by taking medications as prescribed   Maintain a healthy weight Eat mall frequent meals through out the day if possible Stay hydrated Be observant of changes in bowel habits Eat protein rich food Continue with smoothies Continue with your current regimen Continue to get rest          Our next appointment is by telephone on 01/09/24 at 10 am  Please call the care guide team at [phone] if you need to cancel or reschedule your appointment.   If you are experiencing a Mental Health or Behavioral Health Crisis or need someone to talk to, please call 1-800-273-TALK (toll free, 24 hour hotline)  Patient verbalizes understanding of instructions and care plan provided today and agrees to view in MyChart. Active MyChart status and patient understanding of how to access instructions and care plan via MyChart confirmed with patient.     Juanell Fairly RN, BSN, [facility]  [facility], [facility]  Care Coordinator Phone: [phone]

## 2023-12-05 NOTE — Patient Outreach (Signed)
 Care Coordination   Follow Up Visit Note   12/05/2023 Name: Kathleen Stone MRN: [id number] DOB: 02/18/1966  Kathleen Stone is a 58 y.o. year old female who sees Masters, Florentina Addison, DO for primary care. I spoke with  Kathleen Stone by phone today.  What matters to the patients health and wellness today?  Kathleen Stone reports that her energy level is satisfactory. She is maintaining her nutritional intake; however, she is experiencing intermittent diarrhea and is uncertain about the gradual return of her taste sensations. It is noteworthy that she continues to be unable to taste certain foods. She does not report any pain, although she does experience occasional cramping. Her sleep duration is approximately four hours per night, yet she continues to feel fatigued upon awakening. She makes a conscious effort to avoid daytime napping. Overall, Kathleen Stone has indicated that she feels acceptable. A follow-up appointment with her oncologist is scheduled for Friday, March 21st.    Goals Addressed             This Visit's Progress    I want to eat more and and gain weight       Patient Goals/self care activities: -Patient/Caregiver will take medications as prescribed   -Patient/Caregiver will attend all scheduled provider appointments -Patient/Caregiver will call pharmacy for medication refills 3-7 days in advance of running out of medications -Patient/Caregiver will call provider office for new concerns or questions  -Patient/Caregiver will focus on medication adherence by taking medications as prescribed   Maintain a healthy weight Eat mall frequent meals through out the day if possible Stay hydrated Be observant of changes in bowel habits Eat protein rich food Continue with smoothies Continue with your current regimen Continue to get rest          SDOH assessments and interventions completed:  No     Care Coordination Interventions:  Yes, provided   Interventions Today     Flowsheet Row Most Recent Value  Chronic Disease   Chronic disease during today's visit Other  [Mass]  General Interventions   General Interventions Discussed/Reviewed General Interventions Discussed, General Interventions Reviewed  Nutrition Interventions   Nutrition Discussed/Reviewed Nutrition Discussed  Pharmacy Interventions   Pharmacy Dicussed/Reviewed Pharmacy Topics Discussed  Safety Interventions   Safety Discussed/Reviewed Safety Discussed        Follow up plan: Follow up call scheduled for 01/09/24 10 am    Encounter Outcome:  Patient Visit Completed   Juanell Fairly RN, BSN, [facility]  [facility], [facility]  Care Coordinator Phone: [phone]

## 2023-12-06 ENCOUNTER — Inpatient Hospital Stay: Payer: Commercial Managed Care - HMO

## 2023-12-06 ENCOUNTER — Inpatient Hospital Stay: Payer: Commercial Managed Care - HMO | Attending: Oncology

## 2023-12-06 ENCOUNTER — Inpatient Hospital Stay: Payer: Commercial Managed Care - HMO | Admitting: Oncology

## 2023-12-06 ENCOUNTER — Other Ambulatory Visit: Payer: Self-pay | Admitting: *Deleted

## 2023-12-06 ENCOUNTER — Telehealth: Payer: Self-pay | Admitting: *Deleted

## 2023-12-06 VITALS — BP 142/78 | HR 63 | Temp 98.1°F | Resp 18 | Ht 66.0 in | Wt 174.5 lb

## 2023-12-06 DIAGNOSIS — Z860100 Personal history of colon polyps, unspecified: Secondary | ICD-10-CM | POA: Insufficient documentation

## 2023-12-06 DIAGNOSIS — C251 Malignant neoplasm of body of pancreas: Secondary | ICD-10-CM | POA: Insufficient documentation

## 2023-12-06 DIAGNOSIS — Z95828 Presence of other vascular implants and grafts: Secondary | ICD-10-CM | POA: Insufficient documentation

## 2023-12-06 DIAGNOSIS — R109 Unspecified abdominal pain: Secondary | ICD-10-CM | POA: Insufficient documentation

## 2023-12-06 DIAGNOSIS — Z803 Family history of malignant neoplasm of breast: Secondary | ICD-10-CM | POA: Diagnosis not present

## 2023-12-06 DIAGNOSIS — R630 Anorexia: Secondary | ICD-10-CM | POA: Diagnosis not present

## 2023-12-06 DIAGNOSIS — R197 Diarrhea, unspecified: Secondary | ICD-10-CM | POA: Diagnosis not present

## 2023-12-06 DIAGNOSIS — R634 Abnormal weight loss: Secondary | ICD-10-CM | POA: Diagnosis not present

## 2023-12-06 DIAGNOSIS — Z801 Family history of malignant neoplasm of trachea, bronchus and lung: Secondary | ICD-10-CM | POA: Diagnosis not present

## 2023-12-06 DIAGNOSIS — Z800 Family history of malignant neoplasm of digestive organs: Secondary | ICD-10-CM | POA: Insufficient documentation

## 2023-12-06 LAB — CMP (CANCER CENTER ONLY)
ALT: 10 U/L (ref 0–44)
AST: 14 U/L — ABNORMAL LOW (ref 15–41)
Albumin: 3.8 g/dL (ref 3.5–5.0)
Alkaline Phosphatase: 110 U/L (ref 38–126)
Anion gap: 7 (ref 5–15)
BUN: 12 mg/dL (ref 6–20)
CO2: 34 mmol/L — ABNORMAL HIGH (ref 22–32)
Calcium: 9.3 mg/dL (ref 8.9–10.3)
Chloride: 103 mmol/L (ref 98–111)
Creatinine: 0.85 mg/dL (ref 0.44–1.00)
GFR, Estimated: >60 mL/min (ref >60)
Glucose, Bld: 86 mg/dL (ref 70–99)
Potassium: 3.3 mmol/L — ABNORMAL LOW (ref 3.5–5.1)
Sodium: 144 mmol/L (ref 135–145)
Total Bilirubin: 0.7 mg/dL (ref 0.0–1.2)
Total Protein: 6.6 g/dL (ref 6.5–8.1)

## 2023-12-06 LAB — MAGNESIUM: Magnesium: 1.9 mg/dL (ref 1.7–2.4)

## 2023-12-06 MED ORDER — HEPARIN SOD (PORK) LOCK FLUSH 100 UNIT/ML IV SOLN
500.0000 [IU] | Freq: Once | INTRAVENOUS | Status: AC
  Administered 2023-12-06: 500 [IU]

## 2023-12-06 MED ORDER — SODIUM CHLORIDE 0.9% FLUSH
10.0000 mL | Freq: Once | INTRAVENOUS | Status: AC
  Administered 2023-12-06: 10 mL

## 2023-12-06 NOTE — Progress Notes (Signed)
 [facility] OFFICE PROGRESS NOTE   Diagnosis: Pancreas cancer  INTERVAL HISTORY:   Kathleen Stone returns for a scheduled visit.  She generally feels well.  Good appetite.  No pain.  She reports diarrhea for the past 2 weeks.  She has 4-5 loose bowel movements per day.  The bowel movements are not always associated with meals.  She has not tried Imodium.  Objective:  Vital signs in last 24 hours:  Blood pressure (!) 142/78, pulse 63, temperature 98.1 F (36.7 C), temperature source Temporal, resp. rate 18, height 5\' 6"  (1.676 m), weight 174 lb 8 oz (79.2 kg), last menstrual period 02/07/2016, SpO2 100%.    Resp: Lungs clear bilaterally Cardio: Regular rate and rhythm with premature beats GI: No hepatosplenomegaly, no mass, no apparent ascites, mild tenderness in the left upper abdomen Vascular: No leg edema  Portacath/PICC-without erythema  Lab Results:  Lab Results  Component Value Date   WBC 10.8 (H) 09/09/2023   HGB 13.6 09/09/2023   HCT 42.5 09/09/2023   MCV 90.6 09/09/2023   PLT 152 09/09/2023   NEUTROABS 8.1 (H) 09/09/2023    CMP  Lab Results  Component Value Date   NA 143 10/01/2023   K 3.6 10/01/2023   CL 104 10/01/2023   CO2 25 10/01/2023   GLUCOSE 85 10/01/2023   BUN 10 10/01/2023   CREATININE 0.73 10/01/2023   CALCIUM 9.6 10/01/2023   PROT 6.8 09/09/2023   ALBUMIN 3.5 09/09/2023   AST 18 09/09/2023   ALT 12 09/09/2023   ALKPHOS 109 09/09/2023   BILITOT 0.8 09/09/2023   GFRNONAA >60 09/09/2023   GFRAA >60 02/29/2020    Lab Results  Component Value Date   CEA1 27.4 (H) 07/31/2022   ZHY865 139 (H) 10/11/2023      Medications: I have reviewed the patient's current medications.   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 CT abdomen/pelvis 07/30/2022-body/neck pancreas mass with peripheral pancreatic atrophy and duct dilation, suspected encasement of the celiac axis, uterine fibroids MRI abdomen 07/30/2022-3.7 x 4.7 cm pancreas  body/tail mass with encasement of the proximal celiac artery, occlusion with collateralization at the confluence of the SMV and portal vein, no evidence of metastatic disease CT chest 08/01/2022-no intrathoracic metastases Elevated CA 19-9 EUS 08/02/2022-gastric polyp, gastric erosions, 32 x 33 mm pancreas body mass, invasion of the celiac trunk, invasion of the splenoportal confluence, no SMA invasion, 2 malignant appearing nodes in the porta hepatis,T4N1 by endoscopic criteria, FNA biopsy-adenocarcinoma 08/17/2022 Guardant360-tumor mutation burden 0.96; MSI high not detected; variant of uncertain clinical significance NTRK3 T93M Cycle 1 gemcitabine/Abraxane 08/17/2022 Cycle 2 gemcitabine/Abraxane 08/31/2022 Cycle 3 gemcitabine/Abraxane 09/14/2022 Cycle 4 gemcitabine/Abraxane 09/28/2022 Cycle 5 gemcitabine/Abraxane 10/12/2022 Cycle 6 gemcitabine/Abraxane 10/26/2022 CT abdomen/pelvis 10/27/2022-stable pancreas body mass with encasement of the celiac axis and portal vein with occlusion of the splenic vein and varices.  Stable porta hepatis lymph nodes. Cycle 7 gemcitabine/Abraxane 11/09/2022 Cycle 8 gemcitabine/Abraxane 11/23/2022 Cycle 9 gemcitabine/Abraxane 12/07/2022 Cycle 10 gemcitabine/Abraxane 12/21/2022 Cycle 11 gemcitabine/Abraxane 01/04/2023 Cycle 12 gemcitabine/Abraxane 01/18/2023 Cycle 13 gemcitabine/Abraxane 02/01/2023 Cycle 14 gemcitabine/Abraxane 02/15/2023 CT abdomen/pelvis 02/26/2023-stable appearance of pancreas body mass with vascular encasement and chronic occlusion of the splenic vein, no evidence of metastatic disease Cycle 15 gemcitabine/Abraxane 03/01/2023 Cycle 16 gemcitabine/Abraxane 03/15/2023 Cycle 17 Gemcitabine/Abraxane 03/29/2023 EUS pancreas fiducial marker placement 04/17/2023 Pancreas SBRT 05/07/2023 - 05/17/2023, 33 Gray in 5 fractions CT abdomen/pelvis 09/14/2023: Slight decrease in pancreas body mass with persistent vascular encasement, no evidence of metastatic disease    2.  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  Anorexia/weight loss secondary to #1 4.  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  Abdominal pain 6.  History of colon polyps 7.  Family history of multiple cancers Pancreas cancer maternal aunt, maternal cousin, maternal grandfather Breast cancer-maternal aunt, maternal cousin Prostate cancer-maternal uncle Lung cancer-father 8.  Hypertension 9.  Postmenopausal vaginal bleeding 10.  H. pylori 08/02/2022-amoxicillin and Flagyl discontinued 08/21/2022 due to GI symptoms 11.  Syncope event 03/07/2023     Disposition: [name] appears stable.  The CA 19-9 was higher in January.  We will follow-up on the CA 19-9 from today.  She will be referred for restaging CTs if the CA 19-9 is higher today.  She has diarrhea.  This may be related to pancreas insufficiency.  We will check the potassium and magnesium levels today.  She will try Imodium.  She will contact us if the Imodium does not help.  [name] will return for an office visit in 2 weeks.  Thornton Papas, MD  12/06/2023  10:54 AM

## 2023-12-06 NOTE — Telephone Encounter (Signed)
-----   Message from Thornton Papas sent at 12/06/2023 12:50 PM EDT ----- Please call patient the potassium is mildly decreased, resume potassium therapy, try Imodium for diarrhea, contact us if the Imodium does not help, follow-up as scheduled

## 2023-12-06 NOTE — Telephone Encounter (Signed)
 Notified that K+ is low and Dr. Truett Perna wants her to resume 20 meq bid and she agrees. She will try the Imodium as suggested and call if not helpful.

## 2023-12-07 LAB — CANCER ANTIGEN 19-9: CA 19-9: 372 U/mL — ABNORMAL HIGH (ref 0–35)

## 2023-12-09 ENCOUNTER — Encounter: Payer: Self-pay | Admitting: Oncology

## 2023-12-11 ENCOUNTER — Encounter: Payer: Self-pay | Admitting: Oncology

## 2023-12-11 ENCOUNTER — Other Ambulatory Visit: Payer: Self-pay

## 2023-12-11 DIAGNOSIS — C251 Malignant neoplasm of body of pancreas: Secondary | ICD-10-CM

## 2023-12-11 MED ORDER — PREDNISONE 50 MG PO TABS: ORAL_TABLET | ORAL | 0 refills | Status: DC

## 2023-12-11 MED ORDER — DIPHENHYDRAMINE HCL 50 MG PO TABS: ORAL_TABLET | ORAL | 0 refills | Status: DC

## 2023-12-12 ENCOUNTER — Ambulatory Visit: Admitting: Student

## 2023-12-12 ENCOUNTER — Ambulatory Visit (HOSPITAL_COMMUNITY)

## 2023-12-12 ENCOUNTER — Telehealth: Payer: Self-pay

## 2023-12-12 VITALS — BP 154/72 | HR 89 | Temp 98.2°F | Ht 66.0 in | Wt 185.9 lb

## 2023-12-12 DIAGNOSIS — C719 Malignant neoplasm of brain, unspecified: Secondary | ICD-10-CM

## 2023-12-12 DIAGNOSIS — F22 Delusional disorders: Secondary | ICD-10-CM

## 2023-12-12 NOTE — Telephone Encounter (Signed)
 Patient gave verbal understanding and had no further questions or concerns

## 2023-12-12 NOTE — Patient Instructions (Signed)
 Thank you, Kathleen Stone for allowing [place] to provide your care today. Today we discussed behavior changes and the brain mass.  Please note that an MRI has been ordered and once that results we can have you follow back up with [facility].     I have ordered the following medication/changed the following medications:   Stop the following medications: There are no discontinued medications.   Start the following medications: No orders of the defined types were placed in this encounter.    Follow up: 3 months     Should you have any questions or concerns please call the internal medicine clinic at [phone].     Please note that our late policy has changed.  If you are more than 15 minutes late to your appointment, you may be asked to reschedule your appointment.  Dr. Hessie Diener, D.O. [facility]

## 2023-12-12 NOTE — Telephone Encounter (Signed)
-----   Message from Thornton Papas sent at 12/07/2023  2:08 PM EDT ----- Please call patient, the ca19-9 is higher,schedule CT CAP with contrast prior to next office, use port for CT

## 2023-12-12 NOTE — Progress Notes (Signed)
 Established Patient Office Visit  Subjective   Patient ID: Kathleen Stone, female    DOB: 1966-06-13  Age: 58 y.o. MRN: [id number]  Chief Complaint  Patient presents with   Follow-up    Routine office visit / no concerns    Kathleen Stone is a 58 y.o. who presents to the clinic for a follow up of paranoia and a known pilocystic astrocytoma. Please see problem based assessment and plan for additional details.    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   Prediabetes 09/11/2023   Healthcare maintenance 03/13/2023   Genetic testing 08/27/2022   Pancreas cancer (HCC) 08/03/2022   Nausea and vomiting 08/01/2022   Protein-calorie malnutrition, severe 08/01/2022   Fatigue 05/02/2022   Hypokalemia 04/07/2022   Post-menopausal bleeding 04/28/2021   Pilocytic astrocytoma (HCC) 02/26/2020   Headache 02/24/2020   Hematuria, microscopic 02/24/2020   Hyperlipidemia 05/21/2019   Abnormal stress test 01/16/2017   Shortness of breath 11/26/2016   Tingling of left upper extremity 11/15/2016   Essential hypertension 06/13/2016   Right knee pain 02/16/2016   Lumbar radiculopathy, chronic 01/04/2016   History of colonic polyps 08/17/2012   Preventative health care 07/24/2012   Anxiety and depression 08/23/2011   DEGENERATIVE DISC DISEASE, 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   Disturbance in sleep behavior 07/28/2010   LOW BACK PAIN SYNDROME 07/10/2007   Tobacco abuse 10/07/2006      Objective:     BP (!) 154/72 (BP Location: Right Arm, Patient Position: Sitting, Cuff Size: Normal)   Pulse 89   Temp 98.2 F (36.8 C) (Oral)   Ht 5\' 6"  (1.676 m)   Wt 185 lb 14.4 oz (84.3 kg)   LMP 02/07/2016 (Exact Date)   SpO2 100%   BMI 30.01 kg/m  BP Readings from Last 3 Encounters:  12/12/23 (!) 154/72  12/06/23 (!) 142/78  10/11/23 (!) 150/79   Wt Readings from Last 3 Encounters:  12/12/23 185 lb 14.4 oz (84.3 kg)   12/06/23 174 lb 8 oz (79.2 kg)  10/11/23 186 lb 4.8 oz (84.5 kg)      Physical Exam Vitals reviewed.  Constitutional:      General: She is not in acute distress.    Appearance: She is not ill-appearing, toxic-appearing or diaphoretic.  Cardiovascular:     Rate and Rhythm: Normal rate and regular rhythm.  Pulmonary:     Effort: Pulmonary effort is normal.     Breath sounds: Normal breath sounds.  Skin:    General: Skin is warm and dry.     Coloration: Skin is not jaundiced.  Neurological:     Mental Status: She is alert.     Cranial Nerves: No dysarthria or facial asymmetry.     Motor: No weakness.     Comments: CN 3-12 intact and symmetrical bilaterally        No results found for any visits on 12/12/23.  Last CBC Lab Results  Component Value Date   WBC 10.8 (H) 09/09/2023   HGB 13.6 09/09/2023   HCT 42.5 09/09/2023   MCV 90.6 09/09/2023   MCH 29.0 09/09/2023   RDW 15.2 09/09/2023   PLT 152 09/09/2023   Last metabolic panel Lab Results  Component Value Date   GLUCOSE 86 12/06/2023   NA 144 12/06/2023   K 3.3 (L) 12/06/2023   CL 103 12/06/2023   CO2 34 (H) 12/06/2023   BUN 12 12/06/2023  CREATININE 0.85 12/06/2023   GFRNONAA >60 12/06/2023   CALCIUM 9.3 12/06/2023   PROT 6.6 12/06/2023   ALBUMIN 3.8 12/06/2023   LABGLOB 2.7 05/02/2022   AGRATIO 1.6 05/02/2022   BILITOT 0.7 12/06/2023   ALKPHOS 110 12/06/2023   AST 14 (L) 12/06/2023   ALT 10 12/06/2023   ANIONGAP 7 12/06/2023      The ASCVD Risk score (Arnett DK, et al., 2019) failed to calculate for the following reasons:   Risk score cannot be calculated because patient has a medical history suggesting prior/existing ASCVD    Assessment & Plan:   Pilocytic Astrocytoma: Patient reports symptoms of auditory and visual hallucinations that have been present for a few months at this point.  She stated that she feels like she hears music whenever others are not hearing music around and she also  will see people "in the sky" every day.  She also reports other hallucinations including "feeling like something is trying to get in bed with her and that is trying to get on her".  She feels like the "spirits" of the ones that are trying to get in bed with her.  She stated that the hallucinations happen on a daily basis.  She also reports other neurological symptoms including left eye vision changes, she reports that her left vision has been blurry for the past few months as well and that she sees an eye doctor for this but changing glasses has not helped.  She denies headaches, it was hard to assess whether her nausea is due to the brain mass versus the known pancreatic cancer.  Her neurological exam was rather unremarkable.  Per chart review her last brain MRI was 2023 where they reported a 1.5 x 1.5 x 1.2 mass. Plan: -MRI of brain ordered to evaluate for enlargement of her pilocytic astrocytoma, this is needed to evaluate if her hallucinations/paranoia is due to the brain mass -Referral for [facility] already ordered to follow-up with her neurosurgeon.  Paranoia:  Patient continues to endorse paranoia that has been happening on a daily basis for the past few months.  She endorses daily episodes of feeling like there are "spirits trying to get in bed with  her".  She currently follows with behavioral therapy but does not see a psychiatrist.  Patient declined psychiatry referral today and also declined any medications today. Plan -Continue with follow-up with behavioral therapy -Brain MRI ordered to evaluate changes with the known brain mass, this is important to obtain to ensure that her hallucinations and paranoia are not caused from an enlargement or changes of her Pilocytic astrocytoma  Return in about 3 months (around 03/13/2024) for Parnoia, brain mass .    Faith Rogue, DO

## 2023-12-13 ENCOUNTER — Other Ambulatory Visit: Payer: Self-pay

## 2023-12-13 NOTE — Assessment & Plan Note (Signed)
 Patient continues to endorse paranoia that has been happening on a daily basis for the past few months.  She endorses daily episodes of feeling like there are "spirits trying to get in bed with  her".  She currently follows with behavioral therapy but does not see a psychiatrist.  Patient declined psychiatry referral today and also declined any medications today. Plan -Continue with follow-up with behavioral therapy -Brain MRI ordered to evaluate changes with the known brain mass, this is important to obtain to ensure that her hallucinations and paranoia are not caused from an enlargement or changes of her Pilocytic astrocytoma

## 2023-12-13 NOTE — Assessment & Plan Note (Signed)
 Patient reports symptoms of auditory and visual hallucinations that have been present for a few months at this point.  She stated that she feels like she hears music whenever others are not hearing music around and she also will see people "in the sky" every day.  She also reports other hallucinations including "feeling like something is trying to get in bed with her and that is trying to get on her".  She feels like the "spirits" of the ones that are trying to get in bed with her.  She stated that the hallucinations happen on a daily basis.  She also reports other neurological symptoms including left eye vision changes, she reports that her left vision has been blurry for the past few months as well and that she sees an eye doctor for this but changing glasses has not helped.  She denies headaches, it was hard to assess whether her nausea is due to the brain mass versus the known pancreatic cancer.  Her neurological exam was rather unremarkable.  Per chart review her last brain MRI was 2023 where they reported a 1.5 x 1.5 x 1.2 mass. Plan: -MRI of brain ordered to evaluate for enlargement of her pilocytic astrocytoma, this is needed to evaluate if her hallucinations/paranoia is due to the brain mass -Referral for [facility] already ordered to follow-up with her neurosurgeon.

## 2023-12-13 NOTE — Progress Notes (Signed)
 Internal Medicine Clinic Attending  Case discussed with the resident at the time of the visit.  We reviewed the resident's history and exam and pertinent patient test results.  I agree with the assessment, diagnosis, and plan of care documented in the resident's note.

## 2023-12-17 ENCOUNTER — Encounter: Payer: Self-pay | Admitting: Oncology

## 2023-12-18 ENCOUNTER — Encounter: Payer: Self-pay | Admitting: Oncology

## 2023-12-23 ENCOUNTER — Telehealth: Payer: Self-pay | Admitting: *Deleted

## 2023-12-23 NOTE — Telephone Encounter (Signed)
 Radiology had left message reminding staff that patient has CT on 4/10 w/contrast allergy.Marland Kitchen LVM to confirm w/patient that she has her prednisone premeds for her scan on 4/10 and to also take Benadryl 50 mg 1 hour prior to scan. Call back with any other needs.

## 2023-12-25 ENCOUNTER — Encounter: Payer: Self-pay | Admitting: Oncology

## 2023-12-26 ENCOUNTER — Ambulatory Visit (HOSPITAL_COMMUNITY)

## 2023-12-26 ENCOUNTER — Inpatient Hospital Stay

## 2023-12-26 ENCOUNTER — Encounter: Payer: Self-pay | Admitting: *Deleted

## 2023-12-26 ENCOUNTER — Encounter (HOSPITAL_COMMUNITY): Payer: Self-pay

## 2023-12-30 ENCOUNTER — Ambulatory Visit (HOSPITAL_COMMUNITY)

## 2023-12-30 ENCOUNTER — Telehealth: Payer: Self-pay | Admitting: *Deleted

## 2023-12-30 ENCOUNTER — Ambulatory Visit (HOSPITAL_COMMUNITY): Admitting: Clinical

## 2023-12-30 MED ORDER — PREDNISONE 50 MG PO TABS: ORAL_TABLET | ORAL | 0 refills | Status: DC

## 2023-12-30 NOTE — Telephone Encounter (Signed)
 Called her with CT appointment for 4/16, but she is not in town. Will return on 4/21. Informed her that the refill on prednisone was sent to her pharmacy. Rescheduled CT for 4/22 at 1:45/2:00 with lab/port access prior. Will move her OV to 4/24. High priority scheduling message sent.

## 2023-12-31 ENCOUNTER — Telehealth: Payer: Self-pay | Admitting: *Deleted

## 2023-12-31 MED ORDER — POTASSIUM CHLORIDE CRYS ER 20 MEQ PO TBCR
20.0000 meq | EXTENDED_RELEASE_TABLET | Freq: Two times a day (BID) | ORAL | 1 refills | Status: DC
Start: 2023-12-31 — End: 2024-01-07

## 2023-12-31 NOTE — Telephone Encounter (Signed)
 Is out of her K+. [place]

## 2024-01-01 ENCOUNTER — Encounter: Payer: Self-pay | Admitting: Oncology

## 2024-01-01 ENCOUNTER — Ambulatory Visit (HOSPITAL_BASED_OUTPATIENT_CLINIC_OR_DEPARTMENT_OTHER)

## 2024-01-03 ENCOUNTER — Other Ambulatory Visit: Payer: Self-pay | Admitting: Student

## 2024-01-03 MED ORDER — APIXABAN (ELIQUIS) VTE STARTER PACK (10MG AND 5MG): ORAL_TABLET | ORAL | Status: DC

## 2024-01-03 NOTE — Progress Notes (Signed)
 Received after-hours call from this patient and her sister, Kenneth, requesting help sorting out a new Eliquis  prescription after being hospitalized at [facility] in [facility] .  She had some sort of clot near her liver and was started on an Eliquis  starter pack however due to her insurance preferring [place]  providers this medication was quite expensive.  Luckily they did work this out and she will pick up the 5 mg tablets and take 2x twice a day for 1 week and then start taking 1 tablet twice a day.  This means she will run out of the prescription earlier than expected.  I have added the prescription to her medication list and recommended that she follow-up in the clinic for hospital follow-up in the next 2 weeks and we will clear her up her prescription at that visit.  Fairy Pool, DO Internal Medicine Resident, PGY-2 Please contact the on call pager at [phone] for any urgent or emergent needs. 6:36 PM 01/03/2024

## 2024-01-06 ENCOUNTER — Other Ambulatory Visit: Payer: Self-pay

## 2024-01-06 ENCOUNTER — Ambulatory Visit (HOSPITAL_COMMUNITY): Admission: RE | Admit: 2024-01-06 | Source: Ambulatory Visit

## 2024-01-06 ENCOUNTER — Encounter (HOSPITAL_COMMUNITY): Payer: Self-pay

## 2024-01-06 ENCOUNTER — Encounter (HOSPITAL_COMMUNITY): Payer: Self-pay | Admitting: Emergency Medicine

## 2024-01-06 ENCOUNTER — Emergency Department (HOSPITAL_COMMUNITY)

## 2024-01-06 ENCOUNTER — Emergency Department (HOSPITAL_COMMUNITY)
Admission: EM | Admit: 2024-01-06 | Discharge: 2024-01-06 | Disposition: A | Discharge status: Home / Self Care | Attending: Emergency Medicine | Admitting: Emergency Medicine

## 2024-01-06 DIAGNOSIS — R0789 Other chest pain: Secondary | ICD-10-CM | POA: Insufficient documentation

## 2024-01-06 DIAGNOSIS — Z7901 Long term (current) use of anticoagulants: Secondary | ICD-10-CM | POA: Insufficient documentation

## 2024-01-06 DIAGNOSIS — Z8507 Personal history of malignant neoplasm of pancreas: Secondary | ICD-10-CM | POA: Insufficient documentation

## 2024-01-06 DIAGNOSIS — Z85841 Personal history of malignant neoplasm of brain: Secondary | ICD-10-CM | POA: Diagnosis not present

## 2024-01-06 DIAGNOSIS — R002 Palpitations: Secondary | ICD-10-CM | POA: Diagnosis present

## 2024-01-06 DIAGNOSIS — R0602 Shortness of breath: Secondary | ICD-10-CM | POA: Diagnosis not present

## 2024-01-06 DIAGNOSIS — R109 Unspecified abdominal pain: Secondary | ICD-10-CM | POA: Insufficient documentation

## 2024-01-06 LAB — CBC
HCT: 43.5 % (ref 36.0–46.0)
Hemoglobin: 14.3 g/dL (ref 12.0–15.0)
MCH: 28.8 pg (ref 26.0–34.0)
MCHC: 32.9 g/dL (ref 30.0–36.0)
MCV: 87.7 fL (ref 80.0–100.0)
Platelets: 170 10*3/uL (ref 150–400)
RBC: 4.96 MIL/uL (ref 3.87–5.11)
RDW: 15.2 % (ref 11.5–15.5)
WBC: 8.4 10*3/uL (ref 4.0–10.5)
nRBC: 0 % (ref 0.0–0.2)

## 2024-01-06 LAB — COMPREHENSIVE METABOLIC PANEL WITH GFR
ALT: 11 U/L (ref 0–44)
AST: 15 U/L (ref 15–41)
Albumin: 3.2 g/dL — ABNORMAL LOW (ref 3.5–5.0)
Alkaline Phosphatase: 79 U/L (ref 38–126)
Anion gap: 9 (ref 5–15)
BUN: 10 mg/dL (ref 6–20)
CO2: 29 mmol/L (ref 22–32)
Calcium: 8.9 mg/dL (ref 8.9–10.3)
Chloride: 102 mmol/L (ref 98–111)
Creatinine, Ser: 0.82 mg/dL (ref 0.44–1.00)
GFR, Estimated: >60 mL/min (ref >60)
Glucose, Bld: 87 mg/dL (ref 70–99)
Potassium: 3.1 mmol/L — ABNORMAL LOW (ref 3.5–5.1)
Sodium: 140 mmol/L (ref 135–145)
Total Bilirubin: 1.1 mg/dL (ref 0.0–1.2)
Total Protein: 5.9 g/dL — ABNORMAL LOW (ref 6.5–8.1)

## 2024-01-06 LAB — TROPONIN I (HIGH SENSITIVITY)
Troponin I (High Sensitivity): 17 ng/L (ref <18)
Troponin I (High Sensitivity): 21 ng/L — ABNORMAL HIGH (ref <18)

## 2024-01-06 LAB — MAGNESIUM: Magnesium: 2.2 mg/dL (ref 1.7–2.4)

## 2024-01-06 MED ORDER — HEPARIN SOD (PORK) LOCK FLUSH 100 UNIT/ML IV SOLN
500.0000 [IU] | Freq: Once | INTRAVENOUS | Status: AC
Start: 2024-01-06 — End: 2024-01-06
  Administered 2024-01-06: 500 [IU]
  Filled 2024-01-06: qty 5

## 2024-01-06 MED ORDER — POTASSIUM CHLORIDE CRYS ER 20 MEQ PO TBCR
60.0000 meq | EXTENDED_RELEASE_TABLET | Freq: Once | ORAL | Status: AC
  Administered 2024-01-06: 60 meq via ORAL
  Filled 2024-01-06: qty 3

## 2024-01-06 MED ORDER — MORPHINE SULFATE (PF) 4 MG/ML IV SOLN
4.0000 mg | Freq: Once | INTRAVENOUS | Status: AC
  Administered 2024-01-06: 4 mg via INTRAVENOUS
  Filled 2024-01-06: qty 1

## 2024-01-06 MED ORDER — IOHEXOL 350 MG/ML SOLN
75.0000 mL | Freq: Once | INTRAVENOUS | Status: AC | PRN
  Administered 2024-01-06: 75 mL via INTRAVENOUS

## 2024-01-06 MED ORDER — DIPHENHYDRAMINE HCL 50 MG/ML IJ SOLN
50.0000 mg | Freq: Once | INTRAMUSCULAR | Status: AC
  Administered 2024-01-06: 50 mg via INTRAVENOUS
  Filled 2024-01-06: qty 1

## 2024-01-06 MED ORDER — DIPHENHYDRAMINE HCL 25 MG PO CAPS: 50.0000 mg | ORAL_CAPSULE | Freq: Once | ORAL | Status: AC

## 2024-01-06 MED ORDER — METHYLPREDNISOLONE SODIUM SUCC 40 MG IJ SOLR
40.0000 mg | Freq: Once | INTRAMUSCULAR | Status: AC
  Administered 2024-01-06: 40 mg via INTRAVENOUS
  Filled 2024-01-06: qty 1

## 2024-01-06 NOTE — ED Notes (Addendum)
 Pt and daughter report that have paperwork from a hospital in [facility] stating she needs "a stent."  Pt reports the paperwork is at home.  According to a chart review, it looks like the patient was recently started on Eliquis  d/t "a clot near her liver."

## 2024-01-06 NOTE — ED Provider Notes (Signed)
 [place] EMERGENCY DEPARTMENT AT [facility] Provider Note   CSN: [id number] Arrival date & time: 01/06/24  4098     History  Chief Complaint  Patient presents with   Palpitations    Kathleen Stone is a 58 y.o. female with a history of stroke, brain tumor, pancreatic cancer who presents the ED today for palpitations.  Patient reports intermittent palpitations with associated chest discomfort and shortness of breath aggravating or alleviating factors.  No palpitations or discomfort at this time.  Denies any cardiac history.  States that she was recently at her beach and [place] home.  While there, she was started Eliquis  after going to the hospital there and being diagnosed with some sort of blood clot in her liver.  States she started Eliquis  yesterday but symptoms started prior to that.  Denies pain or swelling in the legs.  No additional complaints or concerns at this time.    Home Medications Prior to Admission medications   Medication Sig Start Date End Date Taking? Authorizing Provider  albuterol  (VENTOLIN  HFA) 108 (90 Base) MCG/ACT inhaler Inhale 2 puffs into the lungs every 6 (six) hours as needed for wheezing or shortness of breath. Patient not taking: Reported on 08/23/2023 02/15/23   Sumner Ends, MD  APIXABAN  (ELIQUIS ) VTE STARTER PACK (10MG  AND 5MG ) Take as directed on package: start with two-5mg  tablets twice daily for 7 days. On day 8, switch to one-5mg  tablet twice daily. 01/03/24   Cleven Dallas, DO  diphenhydrAMINE  (BENADRYL ) 50 MG tablet Take 50 mg 1 hour before CT with contrast Patient not taking: Reported on 12/06/2023 09/10/23   Gomez-Caraballo, Maria, MD  diphenhydrAMINE  (BENADRYL ) 50 MG tablet Take 50 mg 1 hour before CT with contrast 12/11/23   Sumner Ends, MD  lidocaine -prilocaine  (EMLA ) cream Apply 1 Application topically as needed. 08/08/22   Sumner Ends, MD  loperamide (IMODIUM) 2 MG capsule Take 2-4 mg by mouth as needed for diarrhea or  loose stools. Maximum 8/day    [provider]  nitroGLYCERIN  (NITROSTAT ) 0.4 MG SL tablet Place 1 tablet (0.4 mg total) under the tongue every 5 (five) minutes as needed for chest pain. Patient not taking: Reported on 05/02/2023 11/01/21 04/11/25  Atway, Rayann N, DO  potassium chloride  SA (KLOR-CON  M) 20 MEQ tablet Take 1 tablet (20 mEq total) by mouth 2 (two) times daily. 12/31/23   Sumner Ends, MD  predniSONE  (DELTASONE ) 50 MG tablet Take 50 mg 13 hours, 7 hours, and 1 hour before contrast injection. Also take Benadryl  50 mg 1 hour prior to CT 12/30/23   Sumner Ends, MD      Allergies    Iohexol     Review of Systems   Review of Systems  Cardiovascular:  Positive for palpitations.  All other systems reviewed and are negative.   Physical Exam Updated Vital Signs BP (!) 155/71   Pulse (!) 56   Temp 98.3 F (36.8 C) (Oral)   Resp 15   Ht 5\' 6"  (1.676 m)   Wt 84.3 kg   LMP 02/07/2016 (Exact Date)   SpO2 100%   BMI 30.00 kg/m  Physical Exam Vitals and nursing note reviewed.  Constitutional:      General: She is not in acute distress.    Appearance: Normal appearance.  HENT:     Head: Normocephalic and atraumatic.     Mouth/Throat:     Mouth: Mucous membranes are moist.  Eyes:     Conjunctiva/sclera:  Conjunctivae normal.     Pupils: Pupils are equal, round, and reactive to light.  Cardiovascular:     Rate and Rhythm: Normal rate and regular rhythm.     Pulses: Normal pulses.  Pulmonary:     Effort: Pulmonary effort is normal.     Breath sounds: Normal breath sounds.  Musculoskeletal:        General: Normal range of motion.     Cervical back: Normal range of motion.  Skin:    General: Skin is warm and dry.     Findings: No rash.  Neurological:     General: No focal deficit present.     Mental Status: She is alert.     Sensory: No sensory deficit.     Motor: No weakness.  Psychiatric:        Mood and Affect: Mood normal.        Behavior: Behavior  normal.    ED Results / Procedures / Treatments   Labs (all labs ordered are listed, but only abnormal results are displayed) Labs Reviewed  COMPREHENSIVE METABOLIC PANEL WITH GFR - Abnormal; Notable for the following components:      Result Value   Potassium 3.1 (*)    Total Protein 5.9 (*)    Albumin 3.2 (*)    All other components within normal limits  CBC  MAGNESIUM   TROPONIN I (HIGH SENSITIVITY)  TROPONIN I (HIGH SENSITIVITY)    EKG EKG Interpretation Date/Time:  Monday January 06 2024 09:42:52 EDT Ventricular Rate:  66 PR Interval:  154 QRS Duration:  90 QT Interval:  404 QTC Calculation: 423 R Axis:   0  Text Interpretation: Normal sinus rhythm Right atrial enlargement Left ventricular hypertrophy with repolarization abnormality ( R in aVL , Cornell product , Romhilt-Estes ) Abnormal ECG When compared with ECG of 07-Mar-2023 09:46, PREVIOUS ECG IS PRESENT similar prior  6/23 Confirmed by Russella Courts ([id number]) on 01/06/2024 9:47:12 AM  Radiology DG Chest 2 View Result Date: 01/06/2024 CLINICAL DATA:  Chest pain EXAM: CHEST - 2 VIEW COMPARISON:  March 17, 2023 FINDINGS: No focal airspace consolidation, pleural effusion, or pneumothorax. No cardiomegaly. No acute fracture or destructive lesion. Right chest port terminates in the mid to lower SVC. Multilevel degenerative disc disease of the spine. IMPRESSION: No acute cardiopulmonary abnormality. Electronically Signed   By: Rance Burrows M.D.   On: 01/06/2024 12:37    Procedures Procedures    Medications Ordered in ED Medications  diphenhydrAMINE  (BENADRYL ) capsule 50 mg (has no administration in time range)    Or  diphenhydrAMINE  (BENADRYL ) injection 50 mg (has no administration in time range)  potassium chloride  SA (KLOR-CON  M) CR tablet 60 mEq (has no administration in time range)  morphine  (PF) 4 MG/ML injection 4 mg (has no administration in time range)  methylPREDNISolone  sodium succinate (SOLU-MEDROL ) 40 mg/mL  injection 40 mg (40 mg Intravenous Given 01/06/24 1209)    ED Course/ Medical Decision Making/ A&P                                 Medical Decision Making Amount and/or Complexity of Data Reviewed Labs: ordered. Radiology: ordered.  Risk Prescription drug management.   This patient presents to the ED for concern of palpitations, this involves an extensive number of treatment options, and is a complaint that carries with it a high risk of complications and morbidity.   Differential diagnosis includes: ACS, pulmonary embolism,  cardiac arrhythmia, electrolyte derangement, dehydration, etc.   Comorbidities  See HPI above   Additional History  Additional history obtained from prior records   Cardiac Monitoring / EKG  The patient was maintained on a cardiac monitor.  I personally viewed and interpreted the cardiac monitored which showed: NSR with right atrial enlargement and left ventricular hypertrophy with repolarization abnormality, a heart rate of 66 bpm.   Lab Tests  I ordered and personally interpreted labs.  The pertinent results include:   CBC is unremarkable Initial troponin of 17, delta troponin pending at shift change. Potassium of 3.1 on CMP Magnesium  is reassuring   Imaging Studies  I ordered imaging studies including CXR, CTA PE study, and CT abdomen pelvis with contrast I independently visualized and interpreted imaging which showed:  CXR shows no acute cardiopulmonary disease. CT's pending at shift change. I agree with the radiologist interpretation   Problem List / ED Course / Critical Interventions / Medication Management  Patient reports intermittent episodes of palpitations with chest discomfort and shortness of breath that been happening for the past week or so.  Denies any aggravating or alleviating factors.  Denies any pains at the time of evaluation.  Patient's has a history of pancreatic cancer but is not currently undergoing  chemotherapy. She started Eliquis  recently due to a blood clot at/around her liver. Reevaluation, patient reports diffuse abdominal pain, which was not there on my initial evaluation. I ordered medications including: Methylprednisolone  and Benadryl  for CT contrast dye allergy  Oral potassium for hypokalemia Morphine  for pain I have reviewed the patients home medicines and have made adjustments as needed   Social Determinants of Health  Tobacco use   Test / Admission - Considered  Patient care signed out to oncoming provider at shift change.       Final Clinical Impression(s) / ED Diagnoses Final diagnoses:  Palpitations    Rx / DC Orders ED Discharge Orders     None         Sonnie Dusky, PA-C 01/06/24 1518    Teddi Favors, DO 01/07/24 0725

## 2024-01-06 NOTE — ED Triage Notes (Signed)
 Pt BIB by EMS for sudden onset of palpitations. Reports intermittent mid-sternal chest pain discomfort. Hx of pancreatic caner and DVT. Started on Eliquis  yesterday. Denies SHOB.  EMS VS: BP 140/70 HR 80 98% RA

## 2024-01-06 NOTE — ED Notes (Signed)
 Pt states she is eating and she "doesn't care if the doctor says no" provider made aware/

## 2024-01-06 NOTE — ED Provider Notes (Signed)
 Assume Care Note  Vitals  BP (!) 155/71   Pulse (!) 56   Temp 98.2 F (36.8 C) (Oral)   Resp 15   Ht 5\' 6"  (1.676 m)   Wt 84.3 kg   LMP 02/07/2016 (Exact Date)   SpO2 100%   BMI 30.00 kg/m   ED Course / MDM   Clinical Course as of 01/06/24 1554  Mon Jan 06, 2024  1535 Hx panc cancer, pw palp and intermittent ShoB and abd pain here, on Eliquis  yest [ ]  pend CT images [AO]    Clinical Course User Index [AO] Lorain Robson, MD   Medical Decision Making Amount and/or Complexity of Data Reviewed Labs: ordered. Radiology: ordered.  Risk Prescription drug management.   Shortly after handoff, patient went to CT scanner for imaging.  She did report itching to her lower extremities and heart racing after receiving contrast.  On examination, she is tachycardic to 100 with systolic of 160.  Lungs are clear to auscultation with no wheezing, abdomen is soft, and patient is without any vomiting or GI symptoms.  No urticaria noted on her skin.  Will continue to observe though her symptoms are not consistent with anaphylaxis at this time.  In the interim, patient was given fluids.  I reviewed patient's labs with first troponin 17, second 21.  I reviewed EKG, which appears nonischemic with T wave inversions noted in lateral leads, present on prior EKG.  Low concern for ACS at this time.  CMP with hypokalemia, received repletion.  No transaminitis.  Patient's itching and tachycardia resolved with no other acute intervention.  CT PE study as well as CT abdomen pelvis resulted with no PE, nonobstructive bilateral kidney stones.  On my exam, patient has no abdominal pain on palpation, no rebound tenderness or guarding.  She is saturating well on room air.  She is suitable for discharge at this time with close outpatient follow-up.  We discussed this at bedside, and patient is agreeable.  She is also tolerating p.o. at this time at bedside.  Strict return precautions were given, patient was discharged  in stable condition.  Patient seen in conjunction with Dr. Val Garin, who agreed with the above work-up and plan of care.       Lorain Robson, MD 01/06/24 8657    Mozell Arias, MD 01/07/24 [phone]

## 2024-01-06 NOTE — ED Notes (Signed)
 Port de-accessed

## 2024-01-06 NOTE — Discharge Instructions (Addendum)
 You were seen in the ED today for chest pain, shortness of breath, palpitations and abdominal pain.  Your workup here did not show any emergency causes to your symptoms today.  Please follow-up with your primary care doctor in the next week for reevaluation.  Please return the ED for any emergency medical symptoms.

## 2024-01-07 ENCOUNTER — Inpatient Hospital Stay

## 2024-01-07 ENCOUNTER — Ambulatory Visit (HOSPITAL_COMMUNITY)
Admission: RE | Admit: 2024-01-07 | Discharge: 2024-01-07 | Disposition: A | Discharge status: Home / Self Care | Source: Ambulatory Visit | Attending: Oncology | Admitting: Oncology

## 2024-01-07 ENCOUNTER — Other Ambulatory Visit: Payer: Self-pay

## 2024-01-07 ENCOUNTER — Encounter (HOSPITAL_BASED_OUTPATIENT_CLINIC_OR_DEPARTMENT_OTHER): Payer: Self-pay

## 2024-01-07 ENCOUNTER — Other Ambulatory Visit: Payer: Self-pay | Admitting: Nurse Practitioner

## 2024-01-07 ENCOUNTER — Other Ambulatory Visit

## 2024-01-07 ENCOUNTER — Inpatient Hospital Stay: Attending: Oncology

## 2024-01-07 ENCOUNTER — Ambulatory Visit: Admitting: Oncology

## 2024-01-07 ENCOUNTER — Telehealth: Payer: Self-pay

## 2024-01-07 VITALS — BP 158/70 | HR 76 | Temp 98.3°F | Resp 16

## 2024-01-07 DIAGNOSIS — R630 Anorexia: Secondary | ICD-10-CM | POA: Diagnosis not present

## 2024-01-07 DIAGNOSIS — Z95828 Presence of other vascular implants and grafts: Secondary | ICD-10-CM

## 2024-01-07 DIAGNOSIS — R634 Abnormal weight loss: Secondary | ICD-10-CM | POA: Diagnosis not present

## 2024-01-07 DIAGNOSIS — C251 Malignant neoplasm of body of pancreas: Secondary | ICD-10-CM | POA: Diagnosis present

## 2024-01-07 DIAGNOSIS — Z803 Family history of malignant neoplasm of breast: Secondary | ICD-10-CM | POA: Diagnosis not present

## 2024-01-07 DIAGNOSIS — R197 Diarrhea, unspecified: Secondary | ICD-10-CM | POA: Diagnosis not present

## 2024-01-07 DIAGNOSIS — R5383 Other fatigue: Secondary | ICD-10-CM | POA: Insufficient documentation

## 2024-01-07 DIAGNOSIS — Z801 Family history of malignant neoplasm of trachea, bronchus and lung: Secondary | ICD-10-CM | POA: Insufficient documentation

## 2024-01-07 DIAGNOSIS — Z800 Family history of malignant neoplasm of digestive organs: Secondary | ICD-10-CM | POA: Diagnosis not present

## 2024-01-07 DIAGNOSIS — Z860100 Personal history of colon polyps, unspecified: Secondary | ICD-10-CM | POA: Insufficient documentation

## 2024-01-07 DIAGNOSIS — R109 Unspecified abdominal pain: Secondary | ICD-10-CM | POA: Insufficient documentation

## 2024-01-07 DIAGNOSIS — I81 Portal vein thrombosis: Secondary | ICD-10-CM | POA: Insufficient documentation

## 2024-01-07 DIAGNOSIS — Z7901 Long term (current) use of anticoagulants: Secondary | ICD-10-CM | POA: Diagnosis not present

## 2024-01-07 LAB — CMP (CANCER CENTER ONLY)
ALT: 11 U/L (ref 0–44)
AST: 19 U/L (ref 15–41)
Albumin: 4.1 g/dL (ref 3.5–5.0)
Alkaline Phosphatase: 110 U/L (ref 38–126)
Anion gap: 10 (ref 5–15)
BUN: 14 mg/dL (ref 6–20)
CO2: 29 mmol/L (ref 22–32)
Calcium: 9.8 mg/dL (ref 8.9–10.3)
Chloride: 102 mmol/L (ref 98–111)
Creatinine: 0.88 mg/dL (ref 0.44–1.00)
GFR, Estimated: >60 mL/min (ref >60)
Glucose, Bld: 109 mg/dL — ABNORMAL HIGH (ref 70–99)
Potassium: 3.1 mmol/L — ABNORMAL LOW (ref 3.5–5.1)
Sodium: 141 mmol/L (ref 135–145)
Total Bilirubin: 0.9 mg/dL (ref 0.0–1.2)
Total Protein: 6.7 g/dL (ref 6.5–8.1)

## 2024-01-07 MED ORDER — SODIUM CHLORIDE 0.9% FLUSH
10.0000 mL | Freq: Once | INTRAVENOUS | Status: AC
  Administered 2024-01-07: 10 mL

## 2024-01-07 MED ORDER — POTASSIUM CHLORIDE CRYS ER 20 MEQ PO TBCR
20.0000 meq | EXTENDED_RELEASE_TABLET | Freq: Two times a day (BID) | ORAL | 1 refills | Status: DC
Start: 2024-01-07 — End: 2024-02-21

## 2024-01-07 MED ORDER — TRAMADOL HCL 50 MG PO TABS: 50.0000 mg | ORAL_TABLET | Freq: Three times a day (TID) | ORAL | 0 refills | Status: DC | PRN

## 2024-01-07 MED ORDER — HEPARIN SOD (PORK) LOCK FLUSH 100 UNIT/ML IV SOLN
500.0000 [IU] | Freq: Once | INTRAVENOUS | Status: AC
  Administered 2024-01-07: 500 [IU]

## 2024-01-07 NOTE — Patient Instructions (Signed)
 [facility] - A DEPT OF [facility]  Discharge Instructions: Thank you for choosing [facility] to provide your oncology and hematology care.   If you have a lab appointment with the Cancer Center, please go directly to the Cancer Center and check in at the registration area.   Wear comfortable clothing and clothing appropriate for easy access to any Portacath or PICC line.   We strive to give you quality time with your provider. You may need to reschedule your appointment if you arrive late (15 or more minutes).  Arriving late affects you and other patients whose appointments are after yours.  Also, if you miss three or more appointments without notifying the office, you may be dismissed from the clinic at the provider's discretion.      For prescription refill requests, have your pharmacy contact our office and allow 72 hours for refills to be completed.    Today you received the following chemotherapy and/or immunotherapy agents Port Flush with labs.   To help prevent nausea and vomiting after your treatment, we encourage you to take your nausea medication as directed.  BELOW ARE SYMPTOMS THAT SHOULD BE REPORTED IMMEDIATELY: *FEVER GREATER THAN 100.4 F (38 C) OR HIGHER *CHILLS OR SWEATING *NAUSEA AND VOMITING THAT IS NOT CONTROLLED WITH YOUR NAUSEA MEDICATION *UNUSUAL SHORTNESS OF BREATH *UNUSUAL BRUISING OR BLEEDING *URINARY PROBLEMS (pain or burning when urinating, or frequent urination) *BOWEL PROBLEMS (unusual diarrhea, constipation, pain near the anus) TENDERNESS IN MOUTH AND THROAT WITH OR WITHOUT PRESENCE OF ULCERS (sore throat, sores in mouth, or a toothache) UNUSUAL RASH, SWELLING OR PAIN  UNUSUAL VAGINAL DISCHARGE OR ITCHING   Items with * indicate a potential emergency and should be followed up as soon as possible or go to the Emergency Department if any problems should occur.  Please show the CHEMOTHERAPY ALERT CARD or  IMMUNOTHERAPY ALERT CARD at check-in to the Emergency Department and triage nurse.  Should you have questions after your visit or need to cancel or reschedule your appointment, please contact [facility] - A DEPT OF [facility]  Dept: [phone]  and follow the prompts.  Office hours are 8:00 a.m. to 4:30 p.m. Monday - Friday. Please note that voicemails left after 4:00 p.m. may not be returned until the following business day.  We are closed weekends and major holidays. You have access to a nurse at all times for urgent questions. Please call the main number to the clinic Dept: [phone] and follow the prompts.   For any non-urgent questions, you may also contact your provider using MyChart. We now offer e-Visits for anyone 21 and older to request care online for non-urgent symptoms. For details visit [email or web address].   Also download the MyChart app! Go to the app store, search "MyChart", open the app, select [facility], and log in with your MyChart username and password.

## 2024-01-07 NOTE — Telephone Encounter (Signed)
 I request medical record from [facility] to be fax over.

## 2024-01-07 NOTE — Progress Notes (Signed)
 Patient her for port-flush labs with port access for CT. Patient reports being seen at [facility] ER last night and having lab work and abd CT. Dr Scherrie Curt and Diana Forster NP made aware. Patint's CT to be canceled. Patient still getting port flushed and lab work. Patient also reports being seen at [facility] and getting placed on Eliquis  for "clots in liver" Patient reports constipation since Sunday and intermittent abd pain (5/10 throbbing). Patient requesting something for pain. Diana Forster NP aware and is going to get medical records from [facility]. Patient signing consent and Diana Forster will address.   Patients port flushed without difficulty.  Good blood return noted with no bruising or swelling noted at site. Gauze dressing applied.  VSS with discharge and left in stable condition and is to follow-up with Diana Forster NP on Thursday.

## 2024-01-07 NOTE — Telephone Encounter (Signed)
 Patient gave verbal understanding and she is not asking her potassium. She request a refill of potassium

## 2024-01-07 NOTE — Telephone Encounter (Signed)
-----   Message from Diana Forster sent at 01/07/2024  2:40 PM EDT ----- Please let her know potassium level is low.  Is she taking potassium twice daily as noted on her medication list?

## 2024-01-08 ENCOUNTER — Ambulatory Visit: Admitting: Student

## 2024-01-08 ENCOUNTER — Encounter: Payer: Self-pay | Admitting: Oncology

## 2024-01-08 VITALS — BP 173/75 | HR 88 | Temp 98.2°F | Ht 66.0 in | Wt 182.0 lb

## 2024-01-08 DIAGNOSIS — R002 Palpitations: Secondary | ICD-10-CM | POA: Insufficient documentation

## 2024-01-08 DIAGNOSIS — I81 Portal vein thrombosis: Secondary | ICD-10-CM

## 2024-01-08 DIAGNOSIS — I10 Essential (primary) hypertension: Secondary | ICD-10-CM | POA: Diagnosis not present

## 2024-01-08 DIAGNOSIS — C719 Malignant neoplasm of brain, unspecified: Secondary | ICD-10-CM | POA: Diagnosis present

## 2024-01-08 DIAGNOSIS — E876 Hypokalemia: Secondary | ICD-10-CM

## 2024-01-08 LAB — CANCER ANTIGEN 19-9: CA 19-9: 639 U/mL — ABNORMAL HIGH (ref 0–35)

## 2024-01-08 MED ORDER — APIXABAN 5 MG PO TABS: 5.0000 mg | ORAL_TABLET | Freq: Two times a day (BID) | ORAL | 3 refills | Status: DC

## 2024-01-08 MED ORDER — AMLODIPINE BESYLATE 5 MG PO TABS: 5.0000 mg | ORAL_TABLET | Freq: Every day | ORAL | 11 refills | Status: DC

## 2024-01-08 NOTE — Assessment & Plan Note (Signed)
 [place], shortness of breath and fatigue, hospital there, placed patient on blood thinner for blood clots in the liver. 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 No SOB, CP, or swelling legs. Provoked prothrombotic event, will need to continue Eliquis  until the inciting factor is gone (pancreatic cancer).  - Continue Eliquis  5 mg BID

## 2024-01-08 NOTE — Assessment & Plan Note (Signed)
 Lab Results  Component Value Date   K 3.1 (L) 01/07/2024   Potassium K 3.1 noted in the ED on 04/21, rechecked at the oncology office that noted no change K 3.1, she was sent home with Kcl 20 mEq BID, states that she started it today. Advised to continue taking it for three days, will f/u with oncology for recheck.

## 2024-01-08 NOTE — Assessment & Plan Note (Signed)
 BP Readings from Last 3 Encounters:  01/08/24 (!) 173/75  01/07/24 (!) 158/70  01/06/24 (!) 161/63   Has a long history of hypertension and has been prescribed metoprolol , which she has stopped taking back in 2023. She was also once on olmesartan -amlodipine -hctz combo. Her BP has been elevated over the past 3 days at three different locations. I will restart amlodipine  and slowly titrate it to optimize BP. - Start amlodipine  5 mg today  - check BP at home

## 2024-01-08 NOTE — Patient Instructions (Signed)
 Thank you, Ms.Namine L Wheless for allowing us  to provide your care today. Today we discussed:   Gerold Kos, MD [facility] Department of Neurosurgery Physicians Office Building [address], [address] [place], [place] [address] Clinics: [phone]  [facility] will call you about MRI brain  Continue Eliquis  5 mg, two times a day   I have ordered the following labs for you:  Lab Orders  No laboratory test(s) ordered today     Tests ordered today:  MRI brain STAT   Referrals ordered today:   Referral Orders  No referral(s) requested today     I have ordered the following medication/changed the following medications:   Stop the following medications: There are no discontinued medications.   Start the following medications: Meds ordered this encounter  Medications   apixaban  (ELIQUIS ) 5 MG TABS tablet    Sig: Take 1 tablet (5 mg total) by mouth 2 (two) times daily.    Dispense:  60 tablet    Refill:  3   amLODipine  (NORVASC ) 5 MG tablet    Sig: Take 1 tablet (5 mg total) by mouth daily.    Dispense:  30 tablet    Refill:  11     Follow up:  3 months      Remember:   Should you have any questions or concerns please call the internal medicine clinic at [phone].     Lanney Pitts, DO [facility]

## 2024-01-08 NOTE — Assessment & Plan Note (Signed)
 Seen in ED 01/06/2024 for palpitations, workup r/o ACS. CTA PE and CT a/p with findings negative PE, 3 X 2.2 cm  poorly defined pancreatic body mass with possible extension to the neck and ill defined density of the porta hepatis. Slight increase of the extrahepatic biliary and common duct dilatation compared to prior. Non obstructing bilateral kidney stones. Small volume free fluid in the pelvis. Her symptoms resolved on the ED and she was discharged home. On exam today, denies any CP, SOB, palpitations. Vital signs are stable.

## 2024-01-08 NOTE — Progress Notes (Signed)
 Established Patient Office Visit  Subjective   Patient ID: Kathleen Stone, female    DOB: [birth year]  Age: 58 y.o. MRN: [id number]  Chief Complaint  Patient presents with   Follow-up    HPI This is a 58 year old female living with a history stated below and presents today for ED follow up, last OV 12/12/2023. Please see problem based assessment and plan for additional details.  PMHX of pilocytic astrocytoma, malignant neoplasm of body of pancreas.     Past Medical History:  Diagnosis Date   Brain tumor (benign) (HCC)    DJD (degenerative joint disease) of cervical spine    Galactorrhea of both breasts 2009   R. sided w/ benign papilloma excised in 4/09. L sided in 5/09   Hyperlipidemia    Hypertension    Hypokalemia    Pancreatic cancer (HCC)    Personal history of adenomatous colonic polyp    09/2007 - diminutive adenoma [facility])   Stroke [facility])    2021- brain tumor bx caused ? stroke-    Tobacco abuse    Unintentional weight loss 05/26/2022   ROS   As per assessment and plan  Objective:     BP (!) 173/75 (BP Location: Right Arm, Patient Position: Sitting, Cuff Size: Small)   Pulse 88   Temp 98.2 F (36.8 C) (Oral)   Ht 5\' 6"  (1.676 m)   Wt 182 lb (82.6 kg)   LMP 02/07/2016 (Exact Date)   SpO2 98%   BMI 29.38 kg/m  BP Readings from Last 3 Encounters:  01/08/24 (!) 173/75  01/07/24 (!) 158/70  01/06/24 (!) 161/63   Wt Readings from Last 3 Encounters:  01/08/24 182 lb (82.6 kg)  01/06/24 185 lb 13.6 oz (84.3 kg)  12/12/23 185 lb 14.4 oz (84.3 kg)      Physical Exam  General: Sitting in chair, no acute distress Cardiovascular: Regular rate, no murmurs appreciated Pulmonary: Breathing comfortably, no wheezing or crackles Abdomen: Soft, nontender, nondistended, bowel sounds present MSK: Range of motion intact, no lower extremity edema No results found for any visits on 01/08/24.  Last CBC Lab Results  Component Value Date   WBC 8.4 01/06/2024    HGB 14.3 01/06/2024   HCT 43.5 01/06/2024   MCV 87.7 01/06/2024   MCH 28.8 01/06/2024   RDW 15.2 01/06/2024   PLT 170 01/06/2024   Last metabolic panel Lab Results  Component Value Date   GLUCOSE 109 (H) 01/07/2024   NA 141 01/07/2024   K 3.1 (L) 01/07/2024   CL 102 01/07/2024   CO2 29 01/07/2024   BUN 14 01/07/2024   CREATININE 0.88 01/07/2024   GFRNONAA >60 01/07/2024   CALCIUM  9.8 01/07/2024   PROT 6.7 01/07/2024   ALBUMIN 4.1 01/07/2024   LABGLOB 2.7 05/02/2022   AGRATIO 1.6 05/02/2022   BILITOT 0.9 01/07/2024   ALKPHOS 110 01/07/2024   AST 19 01/07/2024   ALT 11 01/07/2024   ANIONGAP 10 01/07/2024   Last lipids Lab Results  Component Value Date   CHOL 174 11/16/2021   HDL 38 (L) 11/16/2021   LDLCALC 106 (H) 11/16/2021   TRIG 149 11/16/2021   CHOLHDL 4.6 11/16/2021      The ASCVD Risk score (Arnett DK, et al., 2019) failed to calculate for the following reasons:   Risk score cannot be calculated because patient has a medical history suggesting prior/existing ASCVD    Assessment & Plan:    Patient is discussed with Dr Adriane Albe  Problem List Items Addressed This Visit       Cardiovascular and Mediastinum   Essential hypertension (Chronic)   BP Readings from Last 3 Encounters:  01/08/24 (!) 173/75  01/07/24 (!) 158/70  01/06/24 (!) 161/63   Has a long history of hypertension and has been prescribed metoprolol , which she has stopped taking back in 2023. She was also once on olmesartan -amlodipine -hctz combo. Her BP has been elevated over the past 3 days at three different locations. I will restart amlodipine  and slowly titrate it to optimize BP. - Start amlodipine  5 mg today  - check BP at home       Relevant Medications   apixaban  (ELIQUIS ) 5 MG TABS tablet   amLODipine  (NORVASC ) 5 MG tablet   Portal vein thrombosis   [place], shortness of breath and fatigue, hospital there, placed patient on blood thinner for blood clots in the  liver. 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 No SOB, CP, or swelling legs. Provoked prothrombotic event, will need to continue Eliquis  until the inciting factor is gone (pancreatic cancer).  - Continue Eliquis  5 mg BID       Relevant Medications   apixaban  (ELIQUIS ) 5 MG TABS tablet   amLODipine  (NORVASC ) 5 MG tablet     Nervous and Auditory   Pilocytic astrocytoma (HCC) - Primary (Chronic)   Needs to follow up with [facility] department of Neurosurgery. MRI brain was ordered per the last OV, however she was unable to complete. Re ordered STAT MRI brain scheduled for tomorrow at 4:30 PM.  - Follow up with [facility]  - Ordered STAT MRI brain       Relevant Orders   MR Brain W Wo Contrast     Other   Hypokalemia   Lab Results  Component Value Date   K 3.1 (L) 01/07/2024   Potassium K 3.1 noted in the ED on 04/21, rechecked at the oncology office that noted no change K 3.1, she was sent home with Kcl 20 mEq BID, states that she started it today. Advised to continue taking it for three days, will f/u with oncology for recheck.       Palpitations   Seen in ED 01/06/2024 for palpitations, workup r/o ACS. CTA PE and CT a/p with findings negative PE, 3 X 2.2 cm  poorly defined pancreatic body mass with possible extension to the neck and ill defined density of the porta hepatis. Slight increase of the extrahepatic biliary and common duct dilatation compared to prior. Non obstructing bilateral kidney stones. Small volume free fluid in the pelvis. Her symptoms resolved on the ED and she was discharged home. On exam today, denies any CP, SOB, palpitations. Vital signs are stable.        Return in about 3 months (around 04/08/2024) for Routine follow up .    Lanney Pitts, DO

## 2024-01-08 NOTE — Assessment & Plan Note (Signed)
 Needs to follow up with [facility] department of Neurosurgery. MRI brain was ordered per the last OV, however she was unable to complete. Re ordered STAT MRI brain scheduled for tomorrow at 4:30 PM.  - Follow up with [facility]  - Ordered STAT MRI brain

## 2024-01-09 ENCOUNTER — Encounter: Payer: Self-pay | Admitting: Nurse Practitioner

## 2024-01-09 ENCOUNTER — Inpatient Hospital Stay (HOSPITAL_BASED_OUTPATIENT_CLINIC_OR_DEPARTMENT_OTHER): Admitting: Nurse Practitioner

## 2024-01-09 ENCOUNTER — Ambulatory Visit: Payer: Self-pay

## 2024-01-09 ENCOUNTER — Ambulatory Visit (HOSPITAL_COMMUNITY)
Admission: RE | Admit: 2024-01-09 | Discharge: 2024-01-09 | Disposition: A | Discharge status: Home / Self Care | Source: Ambulatory Visit | Attending: Internal Medicine | Admitting: Internal Medicine

## 2024-01-09 VITALS — BP 145/83 | HR 98 | Temp 98.2°F | Resp 18 | Ht 66.0 in | Wt 181.7 lb

## 2024-01-09 DIAGNOSIS — C251 Malignant neoplasm of body of pancreas: Secondary | ICD-10-CM | POA: Diagnosis not present

## 2024-01-09 DIAGNOSIS — C719 Malignant neoplasm of brain, unspecified: Secondary | ICD-10-CM | POA: Diagnosis present

## 2024-01-09 MED ORDER — PANCRELIPASE (LIP-PROT-AMYL) 36000-114000 UNITS PO CPEP: 72000.0000 [IU] | ORAL_CAPSULE | Freq: Three times a day (TID) | ORAL | 4 refills | Status: DC

## 2024-01-09 MED ORDER — GADOBUTROL 1 MMOL/ML IV SOLN
8.0000 mL | Freq: Once | INTRAVENOUS | Status: AC | PRN
  Administered 2024-01-09: 8 mL via INTRAVENOUS

## 2024-01-09 NOTE — Patient Outreach (Signed)
 Complex Care Management   Visit Note  01/09/2024  Name:  Kathleen Stone MRN: [id number] DOB: 09/09/1966  Situation: Referral received for Complex Care Management related to  Pancreatic Mass  I obtained verbal consent from Patient.  Visit completed with patient  on the phone  Background:   Past Medical History:  Diagnosis Date   Brain tumor (benign) (HCC)    DJD (degenerative joint disease) of cervical spine    Galactorrhea of both breasts 2009   R. sided w/ benign papilloma excised in 4/09. L sided in 5/09   Hyperlipidemia    Hypertension    Hypokalemia    Pancreatic cancer [facility])    Personal history of adenomatous colonic polyp    09/2007 - diminutive adenoma [facility])   Stroke [facility])    2021- brain tumor bx caused ? stroke-    Tobacco abuse    Unintentional weight loss 05/26/2022    Assessment: Patient Reported Symptoms:  Cognitive Cognitive Status: Able to follow simple commands, Alert and oriented to person, place, and time, Insightful and able to interpret abstract concepts      Neurological Neurological Review of Symptoms: No symptoms reported    HEENT HEENT Symptoms Reported: No symptoms reported      Cardiovascular      Respiratory Respiratory Symptoms Reported: No symptoms reported    Endocrine Patient reports the following symptoms related to hypoglycemia or hyperglycemia : No symptoms reported    Gastrointestinal Gastrointestinal Symptoms Reported: Abdominal pain or discomfort Additional Gastrointestinal Details: Cancer  mass      Genitourinary Genitourinary Symptoms Reported: No symptoms reported    Integumentary Integumentary Symptoms Reported: No symptoms reported    Musculoskeletal Musculoskelatal Symptoms Reviewed: No symptoms reported   Falls in the past year?: No    Psychosocial Psychosocial Symptoms Reported: No symptoms reported            01/09/2024   10:28 AM  Depression screen PHQ 2/9  Decreased Interest 0  Down, Depressed,  Hopeless 1  PHQ - 2 Score 1    There were no vitals filed for this visit.  Medications Reviewed Today     Reviewed by Augustin Leber, RN (Registered Nurse) on 01/09/24 at 1039  Med List Status: <None>   Medication Order Taking? Sig Documenting Provider Last Dose Status Informant  albuterol  (VENTOLIN  HFA) 108 (90 Base) MCG/ACT inhaler [id number] No Inhale 2 puffs into the lungs every 6 (six) hours as needed for wheezing or shortness of breath.  Patient not taking: Reported on 01/09/2024   Sumner Ends, MD Not Taking Active   amLODipine  (NORVASC ) 5 MG tablet [id number] Yes Take 1 tablet (5 mg total) by mouth daily. Lanney Pitts, DO Taking Active   apixaban  (ELIQUIS ) 5 MG TABS tablet [id number] No Take 1 tablet (5 mg total) by mouth 2 (two) times daily.  Patient not taking: Reported on 01/09/2024   Lanney Pitts, DO Not Taking Active            Med Note Arleta Lade, Laymon Priest   Thu Jan 09, 2024 10:34 AM) Still using the starter pack   APIXABAN  (ELIQUIS ) VTE STARTER PACK (10MG  AND 5MG ) [id number] Yes Take as directed on package: start with two-5mg  tablets twice daily for 7 days. On day 8, switch to one-5mg  tablet twice daily. Cleven Dallas, DO Taking Active   diphenhydrAMINE  (BENADRYL ) 50 MG tablet [id number] No Take 50 mg 1 hour before CT with contrast  Patient not taking: Reported on 10/11/2023   Gomez-Caraballo, Maria,  MD Not Taking Active   diphenhydrAMINE  (BENADRYL ) 50 MG tablet [id number] Yes Take 50 mg 1 hour before CT with contrast Sherrill, Gary B, MD Taking Active   ELIQUIS  5 MG TABS tablet [id number] No Take 5 mg by mouth 2 (two) times daily. [provider] Unknown Active   lidocaine -prilocaine  (EMLA ) cream [id number] Yes Apply 1 Application topically as needed. Sumner Ends, MD Taking Active   loperamide (IMODIUM) 2 MG capsule [id number] Yes Take 2-4 mg by mouth as needed for diarrhea or loose stools. Maximum 8/day [provider] Taking Active   nitroGLYCERIN   (NITROSTAT ) 0.4 MG SL tablet [id number] Yes Place 1 tablet (0.4 mg total) under the tongue every 5 (five) minutes as needed for chest pain. Atway, Rayann N, DO Taking Active Self  potassium chloride  SA (KLOR-CON  M) 20 MEQ tablet [id number] Yes Take 1 tablet (20 mEq total) by mouth 2 (two) times daily. Roseline Conine, NP Taking Active   predniSONE  (DELTASONE ) 50 MG tablet [id number] Yes Take 50 mg 13 hours, 7 hours, and 1 hour before contrast injection. Also take Benadryl  50 mg 1 hour prior to CT Sumner Ends, MD Taking Active   traMADol  (ULTRAM ) 50 MG tablet [id number] Yes Take 1 tablet (50 mg total) by mouth every 8 (eight) hours as needed. Do not drive while taking Roseline Conine, NP Taking Active             Recommendation:   Continue follow up with your oncologist  Follow Up Plan:  Telephone follow up appointment with care management team member scheduled for: 02/11/24 1030 am   Augustin Leber RN, BSN, [facility] [place]  [facility], [facility]  Care Coordinator Phone: [phone]

## 2024-01-09 NOTE — Patient Instructions (Signed)
 Visit Information  Thank you for taking time to visit with me today. Please don't hesitate to contact me if I can be of assistance to you before our next scheduled appointment.  Your next care management appointment is by telephone on  02/11/24 at 1030 am     Please call the care guide team at [phone] if you need to cancel, schedule, or reschedule an appointment.   Please call [phone]-TALK (toll free, 24 hour hotline) if you are experiencing a Mental Health or Behavioral Health Crisis or need someone to talk to.   Augustin Leber RN, BSN, [facility] [place]  [facility], [facility]  Care Coordinator Phone: [phone]

## 2024-01-09 NOTE — Progress Notes (Unsigned)
 [facility] OFFICE PROGRESS NOTE   Diagnosis: Pancreas cancer  INTERVAL HISTORY:   Kathleen Stone returns as scheduled.  She reports being fatigued and experiencing abdominal pain while she was out of town recently.  She was evaluated at [facility] on 01/02/2024.  She underwent CT scans.  She was started on anticoagulation for portal vein thrombosis.  She was seen in the emergency department at [facility] 01/06/2024 with complaints of palpitations, chest discomfort, shortness of breath.  Chest CT returned negative for PE.  She reports periodic abdominal "cramps".  She took tramadol  yesterday with good relief.  No nausea or vomiting.  No fever, sweats, chills.  Appetite is decreased.  She continues to have loose stools twice a day, mainly after eating.  Weight is stable.  Objective:  Vital signs in last 24 hours:  Blood pressure (!) 145/83, pulse 98, temperature 98.2 F (36.8 C), temperature source Temporal, resp. rate 18, height 5\' 6"  (1.676 m), weight 181 lb 11.2 oz (82.4 kg), last menstrual period 02/07/2016, SpO2 100%.    Resp: Lungs clear bilaterally. Cardio: Regular rate and rhythm. GI: Tender right upper abdomen.  No mass.  No hepatosplenomegaly. Vascular: No leg edema. Neuro: Alert and oriented. Skin: No rash. Port-A-Cath without erythema.  Lab Results:  Lab Results  Component Value Date   WBC 8.4 01/06/2024   HGB 14.3 01/06/2024   HCT 43.5 01/06/2024   MCV 87.7 01/06/2024   PLT 170 01/06/2024   NEUTROABS 8.1 (H) 09/09/2023    Imaging:  No results found.  Medications: I have reviewed the patient's current medications.  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 CT abdomen/pelvis 07/30/2022-body/neck pancreas mass with peripheral pancreatic atrophy and duct dilation, suspected encasement of the celiac axis, uterine fibroids MRI abdomen 07/30/2022-3.7 x 4.7 cm pancreas body/tail mass with encasement of the proximal celiac artery,  occlusion with collateralization at the confluence of the SMV and portal vein, no evidence of metastatic disease CT chest 08/01/2022-no intrathoracic metastases Elevated CA 19-9 EUS 08/02/2022-gastric polyp, gastric erosions, 32 x 33 mm pancreas body mass, invasion of the celiac trunk, invasion of the splenoportal confluence, no SMA invasion, 2 malignant appearing nodes in the porta hepatis,T4N1 by endoscopic criteria, FNA biopsy-adenocarcinoma 08/17/2022 [employer]-tumor mutation burden 0.96; MSI high not detected; 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  Stable porta hepatis lymph nodes. Cycle 7 gemcitabine /Abraxane  11/09/2022 Cycle 8 gemcitabine /Abraxane  11/23/2022 Cycle 9 gemcitabine /Abraxane  12/07/2022 Cycle 10 gemcitabine /Abraxane  12/21/2022 Cycle 11 gemcitabine /Abraxane  01/04/2023 Cycle 12 gemcitabine /Abraxane  01/18/2023 Cycle 13 gemcitabine /Abraxane  02/01/2023 Cycle 14 gemcitabine /Abraxane  02/15/2023 CT abdomen/pelvis 02/26/2023-stable appearance of pancreas body mass with vascular encasement and chronic occlusion of the splenic vein, no evidence of metastatic disease Cycle 15 gemcitabine /Abraxane  03/01/2023 Cycle 16 gemcitabine /Abraxane  03/15/2023 Cycle 17 Gemcitabine /Abraxane  03/29/2023 EUS pancreas fiducial marker placement 04/17/2023 Pancreas SBRT 05/07/2023 - 05/17/2023, 33 Gray in 5 fractions CT abdomen/pelvis 09/14/2023: Slight decrease in pancreas body mass with persistent vascular encasement, no evidence of metastatic disease CTs [facility] 01/02/2024-ill-defined hypoattenuating  mass in the mid pancreatic body surrounding surgical clips.  Pancreatic duct within the distal pancreatic body and tail is dilated.  Mass encases the celiac trunk.  Common bile duct is dilated with suggestion of focal stenosis in  the mid bile duct.  High-grade stenosis/occlusion of the portal vein.  Prominent vessels in the upper abdomen.  Heterogeneous appearance of the splenic parenchyma with suggestion of an infiltrating hypoattenuating mass/masses versus sequela of splenic infarcts.  Multiple pulmonary nodules in the lung bases concerning for metastatic disease. CTs chest/abdomen/pelvis 01/06/2024-no PE.  Cardiomegaly with small pericardial effusion.  Small irregular focus of density medial right lung base.  Poorly defined pancreatic body mass measuring 3 x 2.2 cm, previously 3 x 2.5 cm, possible extension of infiltrative soft tissue towards the pancreatic neck region with ill-defined density at the porta hepatis compared to prior.  Slight increased extrahepatic biliary and common duct dilatation compared to prior.  No focal splenic abnormality.     2.  Pilocystic astrocytoma of the dorsal midbrain-biopsy 03/17/2020, followed at [facility] with observation   3.  Anorexia/weight loss secondary to #1 4.  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  Abdominal pain 6.  History of colon polyps 7.  Family history of multiple cancers Pancreas cancer maternal aunt, maternal cousin, maternal grandfather Breast cancer-maternal aunt, maternal cousin Prostate cancer-maternal uncle Lung cancer-father 8.  Hypertension 9.  Postmenopausal vaginal bleeding 10.  H. pylori 08/02/2022-amoxicillin  and Flagyl  discontinued 08/21/2022 due to GI symptoms 11.  Syncope event 03/07/2023 12.  Eliquis  initiated 01/02/2024 for "high-grade stenosis/occlusion of the portal vein"    Disposition: Kathleen Stone appears stable.  Recent CA 19-9 tumor marker was higher.  She has had 2 visits in different emergency departments over  the past week or so.  We reviewed the CT report from [facility] 01/02/2024, images not available.  We reviewed the CT report/images completed locally 01/06/2024.  On the CT scan 01/06/2024 there does not appear to be definite evidence of disease progression.  Her case will be presented at the upcoming GI tumor conference.    She was started on anticoagulation for portal vein thrombosis based on CT 01/02/2024.  She will continue anticoagulation for now.  The diarrhea may be due to pancreatic insufficiency.  She will begin a trial of pancreatic enzyme replacement.  She will return for follow-up as scheduled 01/31/2024.  Patient seen with Dr. Scherrie Curt.  Diana Forster ANP/GNP-BC   01/09/2024  1:20 PM  This was a shared visit with Diana Forster.  We reviewed the CT findings and images with Kathleen Stone.  The CA 19-9 is higher.  On my review of the 01/06/2024 CT there is no clear evidence of disease progression.  We will present her case at the GI tumor conference for further review of the imaging, specifically to look for evidence of disease progression and venous thrombosis.  She will continue anticoagulation therapy for now.  She has been maintained off of systemic therapy since July 2024.  We will recommend resuming gemcitabine /Abraxane  when there is clear evidence of disease progression.  I was present for greater than 50% of today's visit.  I performed medical decision making.  Anise Kerns, MD

## 2024-01-10 ENCOUNTER — Encounter: Payer: Self-pay | Admitting: Oncology

## 2024-01-15 ENCOUNTER — Encounter: Admitting: Student

## 2024-01-15 ENCOUNTER — Encounter: Payer: Self-pay | Admitting: Oncology

## 2024-01-15 ENCOUNTER — Other Ambulatory Visit: Payer: Self-pay

## 2024-01-17 ENCOUNTER — Encounter: Payer: Self-pay | Admitting: Oncology

## 2024-01-17 NOTE — Progress Notes (Signed)
 The proposed treatment discussed in conference is for discussion purpose only and is not a binding recommendation.  The patients have not been physically examined, or presented with their treatment options.  Therefore, final treatment plans cannot be decided.

## 2024-01-17 NOTE — Progress Notes (Signed)
 Internal Medicine Clinic Attending  Case discussed with the resident at the time of the visit.  We reviewed the resident's history and exam and pertinent patient test results.  I agree with the assessment, diagnosis, and plan of care documented in the resident's note.

## 2024-01-21 ENCOUNTER — Ambulatory Visit (HOSPITAL_COMMUNITY): Admitting: Clinical

## 2024-01-21 DIAGNOSIS — F22 Delusional disorders: Secondary | ICD-10-CM

## 2024-01-21 NOTE — Progress Notes (Signed)
   THERAPIST PROGRESS NOTE  Session Time: 16 minutes  Participation Level: Active  Behavioral Response: CasualAlertEuthymic  Type of Therapy: Individual Therapy  Treatment Goals addressed: client will attend at least 80% of scheduled individual psychotherapy sessions  ProgressTowards Goals: Progressing  Interventions: CBT and Supportive  Summary:  Kathleen Stone is a 58 y.o. female who presents for the schedueled appointment oriented x 5, appropriately dressed, and friendly.  Client denied hallucinations and delusions. Client reported on today she is doing pretty well.  Client reported she has gone on a beach vacation with her family that went well. Client reported she was also in the hospital back in April due to having difficulty with breathing.  Client reported she was put on blood thinners. Client reported she is okay with having to take the medication and it does not make her sad. Client reported she went to the specialist regarding her previously noted brain tumor.  Client reported the doctor's findings concluded that there is a small spot on her brain that should not be correlated to her symptoms of paranoia.  Client reported her symptoms of paranoia are still present believing that there is something moving on the couch or her bed sheets. Client reported she is a spiritual person and states that it is something spiritually going on.  Client engaged in discussion with the therapist about medications.  Client reported she thinks that medications could make it worse but was agreeable to speaking with the psychiatrist about some nighttime medications to help alleviate anxiety and help her to get rest.  Client reported when she went on a beach trip with her family the spiritual entities followed her there. Client reported there was no recommended treatment for it at this time but she will need to follow-up in a year.  Client reported alternatively she has been seen by the oncologist  regarding her pancreatic cancer.  Client reported she is not doing chemotherapy as she states the cancer is wrapped around a vein.  Client reported she follows up with her doctor soon about a course of treatment. Evidence of progress towards goal:  client reported 1 positive of agreeing to meet with a psychiatrist for medication management.  Suicidal/Homicidal: Nowithout intent/plan  Therapist Response:  Therapist began the appointment asking the client how she has been doing. Therapist engaged with active listening and positive emotional support. Therapist used cbt to engage and ask her about changes with her health and how she is dealing with the updates. Therapist used cbt to engage and positively reinforce her engagement with appropriate health appointments. Therapist used cbt to engage with her in discussion about medication management to help with anxiety and improved sleep. Therapist used CBT ask the client to identify her progress with frequency of use with coping skills with continued practice in her daily activity.      Plan: Return again in 4 weeks.  Diagnosis: paranoia  Collaboration of Care: Patient refused AEB none requested.  Patient/Guardian was advised Release of Information must be obtained prior to any record release in order to collaborate their care with an outside provider. Patient/Guardian was advised if they have not already done so to contact the registration department to sign all necessary forms in order for us  to release information regarding their care.   Consent: Patient/Guardian gives verbal consent for treatment and assignment of benefits for services provided during this visit. Patient/Guardian expressed understanding and agreed to proceed.   Mehki Klumpp Y Mariadelosang Wynns, LCSW 01/21/2024

## 2024-01-22 ENCOUNTER — Other Ambulatory Visit: Payer: Self-pay

## 2024-01-23 ENCOUNTER — Ambulatory Visit: Admitting: Internal Medicine

## 2024-01-23 ENCOUNTER — Encounter: Payer: Self-pay | Admitting: *Deleted

## 2024-01-23 ENCOUNTER — Encounter: Payer: Self-pay | Admitting: Oncology

## 2024-01-23 ENCOUNTER — Encounter: Payer: Self-pay | Admitting: Internal Medicine

## 2024-01-23 VITALS — BP 139/65 | HR 78 | Temp 98.2°F | Ht 66.0 in | Wt 178.0 lb

## 2024-01-23 DIAGNOSIS — E876 Hypokalemia: Secondary | ICD-10-CM

## 2024-01-23 DIAGNOSIS — I10 Essential (primary) hypertension: Secondary | ICD-10-CM

## 2024-01-23 DIAGNOSIS — Z23 Encounter for immunization: Secondary | ICD-10-CM

## 2024-01-23 DIAGNOSIS — F22 Delusional disorders: Secondary | ICD-10-CM

## 2024-01-23 DIAGNOSIS — R1084 Generalized abdominal pain: Secondary | ICD-10-CM | POA: Diagnosis present

## 2024-01-23 DIAGNOSIS — Z720 Tobacco use: Secondary | ICD-10-CM

## 2024-01-23 NOTE — Patient Instructions (Addendum)
 [name], it was a pleasure seeing you today! You endorsed feeling well today. Below are some of the things we talked about this visit. We look forward to seeing you in the follow up appointment!  Today we discussed: Start taking creon  regularly before meals. I believe this will resolve your abdominal pain and diarrhea.   I am going to check some lab work today.   Please follow up with Dr. Sharalyn Dasen as scheduled.   Please stop smoking as it can increase the growth of your cancer.   I have ordered the following labs today:   Lab Orders         CBC with Diff         CMP14 + Anion Gap         Lipase       Referrals ordered today:   Referral Orders  No referral(s) requested today     I have ordered the following medication/changed the following medications:   Stop the following medications: Medications Discontinued During This Encounter  Medication Reason   diphenhydrAMINE  (BENADRYL ) 50 MG tablet Patient has not taken in last 30 days   diphenhydrAMINE  (BENADRYL ) 50 MG tablet Patient has not taken in last 30 days   ELIQUIS  5 MG TABS tablet Patient has not taken in last 30 days   traMADol  (ULTRAM ) 50 MG tablet Patient has not taken in last 30 days   predniSONE  (DELTASONE ) 50 MG tablet Patient has not taken in last 30 days     Start the following medications: No orders of the defined types were placed in this encounter.    Follow-up: 3-4 week follow up with Dr. Meredeth Stallion  Please make sure to arrive 15 minutes prior to your next appointment. If you arrive late, you may be asked to reschedule.   We look forward to seeing you next time. Please call our clinic at [phone] if you have any questions or concerns. The best time to call is Monday-Friday from 9am-4pm, but there is someone available 24/7. If after hours or the weekend, call the main hospital number and ask for the Internal Medicine Resident On-Call. If you need medication refills, please notify your pharmacy one  week in advance and they will send us  a request.  Thank you for letting us  take part in your care. Wishing you the best!  Thank you, [name], MD

## 2024-01-23 NOTE — Progress Notes (Signed)
 CC: abdominal pain  HPI:  Kathleen Stone is a 58 y.o. with medical history of HTN, low grade pilocystic astrocytoma, pancreatic carcinoma, tobacco use disorder presenting to [facility] for concern of abdominal pain.   Please see problem-based list for further details, assessments, and plans.  Past Medical History:  Diagnosis Date   Abnormal stress test 01/16/2017   Brain tumor (benign) (HCC)    DJD (degenerative joint disease) of cervical spine    Galactorrhea of both breasts 2009   R. sided w/ benign papilloma excised in 4/09. L sided in 5/09   Hematuria, microscopic 02/24/2020   Hyperlipidemia    Hypertension    Hypokalemia    LOW BACK PAIN SYNDROME 07/10/2007   Qualifier: Diagnosis of   By: Latrelle Pole MD, Josefina Nian         Nausea and vomiting 08/01/2022   Pancreatic cancer (HCC)    Personal history of adenomatous colonic polyp    09/2007 - diminutive adenoma [facility])   Stroke [facility])    2021- brain tumor bx caused ? stroke-    Tobacco abuse    Unintentional weight loss 05/26/2022     Current Outpatient Medications (Cardiovascular):    amLODipine  (NORVASC ) 5 MG tablet, Take 1 tablet (5 mg total) by mouth daily.   nitroGLYCERIN  (NITROSTAT ) 0.4 MG SL tablet, Place 1 tablet (0.4 mg total) under the tongue every 5 (five) minutes as needed for chest pain.  Current Outpatient Medications (Respiratory):    albuterol  (VENTOLIN  HFA) 108 (90 Base) MCG/ACT inhaler, Inhale 2 puffs into the lungs every 6 (six) hours as needed for wheezing or shortness of breath. (Patient not taking: Reported on 08/23/2023)   Current Outpatient Medications (Hematological):    apixaban  (ELIQUIS ) 5 MG TABS tablet, Take 1 tablet (5 mg total) by mouth 2 (two) times daily. (Patient not taking: Reported on 01/09/2024)   APIXABAN  (ELIQUIS ) VTE STARTER PACK (10MG  AND 5MG ), Take as directed on package: start with two-5mg  tablets twice daily for 7 days. On day 8, switch to one-5mg  tablet twice daily.  Current Outpatient  Medications (Other):    lidocaine -prilocaine  (EMLA ) cream, Apply 1 Application topically as needed.   lipase/protease/amylase (CREON ) 36000 UNITS CPEP capsule, Take 2 capsules (72,000 Units total) by mouth 3 (three) times daily with meals.   loperamide (IMODIUM) 2 MG capsule, Take 2-4 mg by mouth as needed for diarrhea or loose stools. Maximum 8/day   potassium chloride  SA (KLOR-CON  M) 20 MEQ tablet, Take 1 tablet (20 mEq total) by mouth 2 (two) times daily.  Review of Systems:  Review of system negative unless stated in the problem list or HPI.    Physical Exam:  Vitals:   01/23/24 1009  BP: 139/65  Pulse: 78  Temp: 98.2 F (36.8 C)  TempSrc: Oral  SpO2: 100%  Weight: 178 lb (80.7 kg)  Height: 5\' 6"  (1.676 m)   Physical Exam General: NAD HENT: NCAT Lungs: CTAB, no wheeze, rhonchi or rales.  Cardiovascular: Normal heart sounds, no r/m/g, 2+ pulses in all extremities. No LE edema Abdomen: TTP diffusely, normal bowel sounds MSK: No asymmetry or muscle atrophy.  Skin: no lesions noted on exposed skin Neuro: Alert and oriented x4. CN grossly intact Psych: Normal mood and normal affect   Assessment & Plan:   Essential hypertension Better controlled with amlodipine  5 mg every day.   Paranoia (HCC) States she discussed her symptoms with neurosurgeon and he does not believe they are coming from the astrocytoma. She has seen a psychiatrist  and they recommend initiating a medication for her. She is unsure what medicine it is. Advised continued follow up with them.    Abdominal pain Pt reports abdominal pain for the last 2-3 days. She reports this as an acute concern but appears this is a chronic concern for her as she had urgent care evaluation for this as well as clinic evaluation for this. Reports the pain as waxing and waning with severe at 6/10. No fevers, or chills. Has diarrhea. Pain gets worse with eating. Improves with bowel movement. Loose bowel movement with 3-4 BM daily  depending on how much she eats. She reports steatorrhea as well. Exam shows diffuse tenderness without any one specific area of focus, she has normal bowel sounds. Suspect etiology of her pain secondary to her pancreatic insufficiency and chronic diarrhea secondary to that. She is on chronic potassium supplementation. Less concern for acute causes given exam and recent imaging. Lab work showed overall normal CMP but low lipase which is consistent with pancreatic insufficiency. She was started Creon  previously but hasn't taken it. Advised her to take this regularly to note an improvement. Advised to follow up with oncology as scheduled.   Hypokalemia Secondary to chronic diarrhea 2/2 to pancreatic insufficiency. Should resolve with Creon  supplementation. K was 3.9 this visit so will decrease her K to 20 mEq daily. Recheck at follow up. Discussed eating well balanced diet.   Tobacco abuse Discussed tobacco cessation with her extensively, given 2 cancers including astrocytoma and pancreatic adenocarcinoma. Pt states she needs this for her coping. Offered NRT and therapy but pt declining at this time.    See Encounters Tab for problem based charting.  Patient Discussed with Dr. Blythe Fruitland, MD Tommas Fragmin. [facility] Internal Medicine Residency, PGY-3

## 2024-01-24 ENCOUNTER — Encounter: Payer: Self-pay | Admitting: Oncology

## 2024-01-24 LAB — CMP14 + ANION GAP
ALT: 10 IU/L (ref 0–32)
AST: 17 IU/L (ref 0–40)
Albumin: 4.1 g/dL (ref 3.8–4.9)
Alkaline Phosphatase: 129 IU/L — ABNORMAL HIGH (ref 44–121)
Anion Gap: 16 mmol/L (ref 10.0–18.0)
BUN/Creatinine Ratio: 13 (ref 9–23)
BUN: 10 mg/dL (ref 6–24)
Bilirubin Total: 0.9 mg/dL (ref 0.0–1.2)
CO2: 24 mmol/L (ref 20–29)
Calcium: 9.6 mg/dL (ref 8.7–10.2)
Chloride: 102 mmol/L (ref 96–106)
Creatinine, Ser: 0.8 mg/dL (ref 0.57–1.00)
Globulin, Total: 2.4 g/dL (ref 1.5–4.5)
Glucose: 75 mg/dL (ref 70–99)
Potassium: 3.9 mmol/L (ref 3.5–5.2)
Sodium: 142 mmol/L (ref 134–144)
Total Protein: 6.5 g/dL (ref 6.0–8.5)
eGFR: 85 mL/min/{1.73_m2} (ref >59)

## 2024-01-24 LAB — CBC WITH DIFFERENTIAL/PLATELET
Basophils Absolute: 0 10*3/uL (ref 0.0–0.2)
Basos: 0 %
EOS (ABSOLUTE): 0.1 10*3/uL (ref 0.0–0.4)
Eos: 1 %
Hematocrit: 43.7 % (ref 34.0–46.6)
Hemoglobin: 14.8 g/dL (ref 11.1–15.9)
Immature Grans (Abs): 0 10*3/uL (ref 0.0–0.1)
Immature Granulocytes: 0 %
Lymphocytes Absolute: 2 10*3/uL (ref 0.7–3.1)
Lymphs: 23 %
MCH: 29.5 pg (ref 26.6–33.0)
MCHC: 33.9 g/dL (ref 31.5–35.7)
MCV: 87 fL (ref 79–97)
Monocytes Absolute: 0.5 10*3/uL (ref 0.1–0.9)
Monocytes: 6 %
Neutrophils Absolute: 6.3 10*3/uL (ref 1.4–7.0)
Neutrophils: 70 %
Platelets: 194 10*3/uL (ref 150–450)
RBC: 5.02 x10E6/uL (ref 3.77–5.28)
RDW: 15.2 % (ref 11.7–15.4)
WBC: 8.9 10*3/uL (ref 3.4–10.8)

## 2024-01-24 LAB — LIPASE: Lipase: 9 U/L — ABNORMAL LOW (ref 14–72)

## 2024-01-24 NOTE — Assessment & Plan Note (Addendum)
 Secondary to chronic diarrhea 2/2 to pancreatic insufficiency. Should resolve with Creon  supplementation. K was 3.9 this visit so will decrease her K to 20 mEq daily. Recheck at follow up. Discussed eating well balanced diet.

## 2024-01-24 NOTE — Assessment & Plan Note (Signed)
 States she discussed her symptoms with neurosurgeon and he does not believe they are coming from the astrocytoma. She has seen a psychiatrist and they recommend initiating a medication for her. She is unsure what medicine it is. Advised continued follow up with them.

## 2024-01-24 NOTE — Progress Notes (Signed)
 Internal Medicine Clinic Attending  Case discussed with the resident at the time of the visit.  We reviewed the resident's history and exam and pertinent patient test results.  I agree with the assessment, diagnosis, and plan of care documented in the resident's note.

## 2024-01-24 NOTE — Assessment & Plan Note (Signed)
 Better controlled with amlodipine  5 mg every day.

## 2024-01-24 NOTE — Assessment & Plan Note (Signed)
 Discussed tobacco cessation with her extensively, given 2 cancers including astrocytoma and pancreatic adenocarcinoma. Pt states she needs this for her coping. Offered NRT and therapy but pt declining at this time.

## 2024-01-24 NOTE — Assessment & Plan Note (Signed)
 Pt reports abdominal pain for the last 2-3 days. She reports this as an acute concern but appears this is a chronic concern for her as she had urgent care evaluation for this as well as clinic evaluation for this. Reports the pain as waxing and waning with severe at 6/10. No fevers, or chills. Has diarrhea. Pain gets worse with eating. Improves with bowel movement. Loose bowel movement with 3-4 BM daily depending on how much she eats. She reports steatorrhea as well. Exam shows diffuse tenderness without any one specific area of focus, she has normal bowel sounds. Suspect etiology of her pain secondary to her pancreatic insufficiency and chronic diarrhea secondary to that. She is on chronic potassium supplementation. Less concern for acute causes given exam and recent imaging. Lab work showed overall normal CMP but low lipase which is consistent with pancreatic insufficiency. She was started Creon  previously but hasn't taken it. Advised her to take this regularly to note an improvement. Advised to follow up with oncology as scheduled.

## 2024-01-29 ENCOUNTER — Telehealth: Payer: Self-pay

## 2024-01-29 ENCOUNTER — Other Ambulatory Visit: Payer: Self-pay

## 2024-01-29 ENCOUNTER — Inpatient Hospital Stay
Admission: RE | Admit: 2024-01-29 | Discharge: 2024-01-29 | Disposition: A | Discharge status: Home / Self Care | Payer: Self-pay | Source: Ambulatory Visit | Attending: Nurse Practitioner | Admitting: Nurse Practitioner

## 2024-01-29 DIAGNOSIS — C251 Malignant neoplasm of body of pancreas: Secondary | ICD-10-CM

## 2024-01-29 MED ORDER — PANTOPRAZOLE SODIUM 40 MG PO TBEC: 40.0000 mg | DELAYED_RELEASE_TABLET | Freq: Every day | ORAL | 1 refills | Status: DC

## 2024-01-29 NOTE — Addendum Note (Signed)
 Addended by: Lanasia Porras S on: 01/29/2024 02:19 PM   Modules accepted: Orders

## 2024-01-29 NOTE — Telephone Encounter (Signed)
 The patient called expressing concerns about a burning sensation in her stomach, which began approximately two weeks ago. She reports taking Pepto-Bismol, which provided some relief. She has experienced decreased appetite, along with nausea and vomiting. The patient inquired about the possibility of a prescription to help alleviate the stomach burning. She is scheduled to return for a follow-up appointment on Friday.

## 2024-01-30 ENCOUNTER — Encounter: Payer: Self-pay | Admitting: Oncology

## 2024-01-31 ENCOUNTER — Inpatient Hospital Stay (HOSPITAL_BASED_OUTPATIENT_CLINIC_OR_DEPARTMENT_OTHER): Payer: Commercial Managed Care - HMO | Admitting: Oncology

## 2024-01-31 ENCOUNTER — Inpatient Hospital Stay: Payer: Commercial Managed Care - HMO | Attending: Oncology

## 2024-01-31 ENCOUNTER — Inpatient Hospital Stay: Payer: Commercial Managed Care - HMO

## 2024-01-31 VITALS — BP 153/73 | HR 81 | Temp 98.1°F | Resp 18 | Ht 66.0 in | Wt 175.0 lb

## 2024-01-31 DIAGNOSIS — Z452 Encounter for adjustment and management of vascular access device: Secondary | ICD-10-CM | POA: Insufficient documentation

## 2024-01-31 DIAGNOSIS — Z800 Family history of malignant neoplasm of digestive organs: Secondary | ICD-10-CM | POA: Diagnosis not present

## 2024-01-31 DIAGNOSIS — R110 Nausea: Secondary | ICD-10-CM | POA: Diagnosis not present

## 2024-01-31 DIAGNOSIS — R109 Unspecified abdominal pain: Secondary | ICD-10-CM | POA: Diagnosis not present

## 2024-01-31 DIAGNOSIS — I81 Portal vein thrombosis: Secondary | ICD-10-CM | POA: Diagnosis not present

## 2024-01-31 DIAGNOSIS — Z860100 Personal history of colon polyps, unspecified: Secondary | ICD-10-CM | POA: Insufficient documentation

## 2024-01-31 DIAGNOSIS — Z801 Family history of malignant neoplasm of trachea, bronchus and lung: Secondary | ICD-10-CM | POA: Diagnosis not present

## 2024-01-31 DIAGNOSIS — Z95828 Presence of other vascular implants and grafts: Secondary | ICD-10-CM

## 2024-01-31 DIAGNOSIS — Z5111 Encounter for antineoplastic chemotherapy: Secondary | ICD-10-CM | POA: Insufficient documentation

## 2024-01-31 DIAGNOSIS — C251 Malignant neoplasm of body of pancreas: Secondary | ICD-10-CM | POA: Insufficient documentation

## 2024-01-31 DIAGNOSIS — Z803 Family history of malignant neoplasm of breast: Secondary | ICD-10-CM | POA: Insufficient documentation

## 2024-01-31 DIAGNOSIS — Z7901 Long term (current) use of anticoagulants: Secondary | ICD-10-CM | POA: Diagnosis not present

## 2024-01-31 DIAGNOSIS — R630 Anorexia: Secondary | ICD-10-CM | POA: Diagnosis not present

## 2024-01-31 DIAGNOSIS — R197 Diarrhea, unspecified: Secondary | ICD-10-CM | POA: Insufficient documentation

## 2024-01-31 MED ORDER — HEPARIN SOD (PORK) LOCK FLUSH 100 UNIT/ML IV SOLN
500.0000 [IU] | Freq: Once | INTRAVENOUS | Status: AC
  Administered 2024-01-31: 500 [IU] via INTRAVENOUS

## 2024-01-31 MED ORDER — ONDANSETRON HCL 8 MG PO TABS: 8.0000 mg | ORAL_TABLET | Freq: Three times a day (TID) | ORAL | 1 refills | Status: DC | PRN

## 2024-01-31 MED ORDER — SODIUM CHLORIDE 0.9% FLUSH
10.0000 mL | INTRAVENOUS | Status: DC | PRN
  Administered 2024-01-31: 10 mL via INTRAVENOUS

## 2024-01-31 NOTE — Progress Notes (Signed)
 [facility] OFFICE PROGRESS NOTE   Diagnosis: Pancreas cancer  INTERVAL HISTORY:   Kathleen Stone returns as scheduled.  She reports intermittent nausea and abdominal pain.  She has a poor appetite.  She is taking pancreatic enzyme replacement and has intermittent diarrhea.  Her sister is present by telephone for today's visit.  No neuropathy symptoms.  She has burning discomfort in the abdomen  Objective:  Vital signs in last 24 hours:  Blood pressure (!) 153/73, pulse 81, temperature 98.1 F (36.7 C), temperature source Temporal, resp. rate 18, height 5\' 6"  (1.676 m), weight 175 lb (79.4 kg), last menstrual period 02/07/2016, SpO2 100%.    Lymphatics: No cervical or supraclavicular nodes Resp: Lungs clear bilaterally Cardio: Regular rate and rhythm GI: No mass, no hepatosplenomegaly, soft, tender in the mid and right upper abdomen Vascular: No leg edema    Portacath/PICC-without erythema  Lab Results:  Lab Results  Component Value Date   WBC 8.9 01/23/2024   HGB 14.8 01/23/2024   HCT 43.7 01/23/2024   MCV 87 01/23/2024   PLT 194 01/23/2024   NEUTROABS 6.3 01/23/2024    CMP  Lab Results  Component Value Date   NA 142 01/23/2024   K 3.9 01/23/2024   CL 102 01/23/2024   CO2 24 01/23/2024   GLUCOSE 75 01/23/2024   BUN 10 01/23/2024   CREATININE 0.80 01/23/2024   CALCIUM  9.6 01/23/2024   PROT 6.5 01/23/2024   ALBUMIN 4.1 01/23/2024   AST 17 01/23/2024   ALT 10 01/23/2024   ALKPHOS 129 (H) 01/23/2024   BILITOT 0.9 01/23/2024   GFRNONAA >60 01/07/2024   GFRAA >60 02/29/2020    Lab Results  Component Value Date   CEA1 27.4 (H) 07/31/2022   ZOX096 639 (H) 01/07/2024     Medications: I have reviewed the patient's current medications.   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 CT abdomen/pelvis 07/30/2022-body/neck pancreas mass with peripheral pancreatic atrophy and duct dilation, suspected encasement of the celiac axis, uterine fibroids MRI  abdomen 07/30/2022-3.7 x 4.7 cm pancreas body/tail mass with encasement of the proximal celiac artery, occlusion with collateralization at the confluence of the SMV and portal vein, no evidence of metastatic disease CT chest 08/01/2022-no intrathoracic metastases Elevated CA 19-9 EUS 08/02/2022-gastric polyp, gastric erosions, 32 x 33 mm pancreas body mass, invasion of the celiac trunk, invasion of the splenoportal confluence, no SMA invasion, 2 malignant appearing nodes in the porta hepatis,T4N1 by endoscopic criteria, FNA biopsy-adenocarcinoma 08/17/2022 [employer]-tumor mutation burden 0.96; MSI high not detected; 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  Stable porta hepatis lymph nodes. Cycle 7 gemcitabine /Abraxane  11/09/2022 Cycle 8 gemcitabine /Abraxane  11/23/2022 Cycle 9 gemcitabine /Abraxane  12/07/2022 Cycle 10 gemcitabine /Abraxane  12/21/2022 Cycle 11 gemcitabine /Abraxane  01/04/2023 Cycle 12 gemcitabine /Abraxane  01/18/2023 Cycle 13 gemcitabine /Abraxane  02/01/2023 Cycle 14 gemcitabine /Abraxane  02/15/2023 CT abdomen/pelvis 02/26/2023-stable appearance of pancreas body mass with vascular encasement and chronic occlusion of the splenic vein, no evidence of metastatic disease Cycle 15 gemcitabine /Abraxane  03/01/2023 Cycle 16 gemcitabine /Abraxane  03/15/2023 Cycle 17 Gemcitabine /Abraxane  03/29/2023 EUS pancreas fiducial marker placement 04/17/2023 Pancreas SBRT 05/07/2023 - 05/17/2023, 33 Gray in 5 fractions CT abdomen/pelvis 09/14/2023: Slight decrease in pancreas body mass with persistent vascular encasement, no  evidence of metastatic disease CTs [facility]  01/02/2024-ill-defined hypoattenuating mass in the mid pancreatic body surrounding surgical clips.  Pancreatic duct within the distal pancreatic body and tail is dilated.  Mass encases the celiac trunk.  Common bile duct is dilated with suggestion of focal stenosis in the mid bile duct.  High-grade stenosis/occlusion of the portal vein.  Prominent vessels in the upper abdomen.  Heterogeneous appearance of the splenic parenchyma with suggestion of an infiltrating hypoattenuating mass/masses versus sequela of splenic infarcts.  Multiple pulmonary nodules in the lung bases concerning for metastatic disease. CTs chest/abdomen/pelvis 01/06/2024-no PE.  Cardiomegaly with small pericardial effusion.  Small irregular focus of density medial right lung base.  Poorly defined pancreatic body mass measuring 3 x 2.2 cm, previously 3 x 2.5 cm, possible extension of infiltrative soft tissue towards the pancreatic neck region with ill-defined density at the porta hepatis compared to prior.  Slight increased extrahepatic biliary and common duct dilatation compared to prior.  No focal splenic abnormality.     2.  Pilocystic astrocytoma of the dorsal midbrain-biopsy 03/17/2020, followed at [facility] with observation   3.  Anorexia/weight loss secondary to #1 4.  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  Abdominal pain 6.  History of colon polyps 7.  Family history of multiple cancers Pancreas cancer maternal aunt, maternal cousin, maternal grandfather Breast cancer-maternal aunt, maternal cousin Prostate cancer-maternal uncle Lung cancer-father 8.  Hypertension 9.  Postmenopausal vaginal bleeding 10.  H. pylori 08/02/2022-amoxicillin  and Flagyl  discontinued 08/21/2022 due to GI symptoms 11.  Syncope event 03/07/2023 12.  Eliquis  initiated 01/02/2024 for "high-grade stenosis/occlusion of the portal vein"     Disposition: Kathleen Stone has a history  of locally advanced pancreas cancer.  She has been maintained off of systemic therapy since July 2024.  She completed SBRT to the pancreas mass in August 2024. Her performance status has declined over the past month.  She has increased abdominal pain, nausea, and anorexia.  The CA 19-9 is higher.  CTs on 01/06/2024 revealed a persistent pancreas mass.  The clinical presentation is consistent with progression of pancreas cancer.  We discussed treatment options.  I recommend resuming gemcitabine /Abraxane .  She is in agreement.  We discussed potential toxicities associated with this regimen.  She agrees to proceed.  She will be scheduled for gemcitabine /Abraxane  on 02/07/2023.  She was prescribed pantoprazole  for the abdominal discomfort and nausea.  We will prescribe narcotic analgesic as needed.  No thrombosis was seen on the recent CT.  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  Her case was presented at the GI tumor conference on 01/15/2024.  The conference recommendation is to proceed with salvage chemotherapy.  Chemotherapy orders were entered today.  Coni Deep, MD  01/31/2024  10:08 AM

## 2024-02-01 LAB — CANCER ANTIGEN 19-9: CA 19-9: 725 U/mL — ABNORMAL HIGH (ref 0–35)

## 2024-02-03 ENCOUNTER — Emergency Department (HOSPITAL_COMMUNITY): Admission: EM | Admit: 2024-02-03 | Discharge: 2024-02-04 | Disposition: A | Discharge status: Home / Self Care

## 2024-02-03 ENCOUNTER — Emergency Department (HOSPITAL_COMMUNITY)

## 2024-02-03 ENCOUNTER — Other Ambulatory Visit: Payer: Self-pay

## 2024-02-03 ENCOUNTER — Telehealth: Payer: Self-pay

## 2024-02-03 ENCOUNTER — Encounter (HOSPITAL_COMMUNITY): Payer: Self-pay | Admitting: *Deleted

## 2024-02-03 DIAGNOSIS — Z8507 Personal history of malignant neoplasm of pancreas: Secondary | ICD-10-CM | POA: Diagnosis not present

## 2024-02-03 DIAGNOSIS — E860 Dehydration: Secondary | ICD-10-CM

## 2024-02-03 DIAGNOSIS — Z85841 Personal history of malignant neoplasm of brain: Secondary | ICD-10-CM | POA: Diagnosis not present

## 2024-02-03 DIAGNOSIS — R410 Disorientation, unspecified: Secondary | ICD-10-CM | POA: Diagnosis present

## 2024-02-03 DIAGNOSIS — R4182 Altered mental status, unspecified: Secondary | ICD-10-CM

## 2024-02-03 DIAGNOSIS — Z7901 Long term (current) use of anticoagulants: Secondary | ICD-10-CM | POA: Insufficient documentation

## 2024-02-03 LAB — COMPREHENSIVE METABOLIC PANEL WITH GFR
ALT: 17 U/L (ref 0–44)
AST: 23 U/L (ref 15–41)
Albumin: 3.6 g/dL (ref 3.5–5.0)
Alkaline Phosphatase: 96 U/L (ref 38–126)
Anion gap: 9 (ref 5–15)
BUN: 10 mg/dL (ref 6–20)
CO2: 26 mmol/L (ref 22–32)
Calcium: 9.4 mg/dL (ref 8.9–10.3)
Chloride: 106 mmol/L (ref 98–111)
Creatinine, Ser: 0.82 mg/dL (ref 0.44–1.00)
GFR, Estimated: >60 mL/min (ref >60)
Glucose, Bld: 116 mg/dL — ABNORMAL HIGH (ref 70–99)
Potassium: 3 mmol/L — ABNORMAL LOW (ref 3.5–5.1)
Sodium: 141 mmol/L (ref 135–145)
Total Bilirubin: 1 mg/dL (ref 0.0–1.2)
Total Protein: 7.3 g/dL (ref 6.5–8.1)

## 2024-02-03 LAB — CBG MONITORING, ED
Glucose-Capillary: 120 mg/dL — ABNORMAL HIGH (ref 70–99)
Glucose-Capillary: 103 mg/dL — ABNORMAL HIGH (ref 70–99)

## 2024-02-03 LAB — CBC WITH DIFFERENTIAL/PLATELET
Abs Immature Granulocytes: 0.03 10*3/uL (ref 0.00–0.07)
Basophils Absolute: 0.1 10*3/uL (ref 0.0–0.1)
Basophils Relative: 1 %
Eosinophils Absolute: 0.1 10*3/uL (ref 0.0–0.5)
Eosinophils Relative: 1 %
HCT: 43.1 % (ref 36.0–46.0)
Hemoglobin: 14 g/dL (ref 12.0–15.0)
Immature Granulocytes: 0 %
Lymphocytes Relative: 26 %
Lymphs Abs: 2.6 10*3/uL (ref 0.7–4.0)
MCH: 28.8 pg (ref 26.0–34.0)
MCHC: 32.5 g/dL (ref 30.0–36.0)
MCV: 88.7 fL (ref 80.0–100.0)
Monocytes Absolute: 0.6 10*3/uL (ref 0.1–1.0)
Monocytes Relative: 6 %
Neutro Abs: 6.8 10*3/uL (ref 1.7–7.7)
Neutrophils Relative %: 66 %
Platelets: 220 10*3/uL (ref 150–400)
RBC: 4.86 MIL/uL (ref 3.87–5.11)
RDW: 15.1 % (ref 11.5–15.5)
WBC: 10.2 10*3/uL (ref 4.0–10.5)
nRBC: 0 % (ref 0.0–0.2)

## 2024-02-03 LAB — AMMONIA: Ammonia: 16 umol/L (ref 9–35)

## 2024-02-03 MED ORDER — POTASSIUM CHLORIDE CRYS ER 20 MEQ PO TBCR
40.0000 meq | EXTENDED_RELEASE_TABLET | Freq: Once | ORAL | Status: AC
  Administered 2024-02-03: 40 meq via ORAL
  Filled 2024-02-03: qty 2

## 2024-02-03 MED ORDER — SODIUM CHLORIDE 0.9 % IV BOLUS
1000.0000 mL | Freq: Once | INTRAVENOUS | Status: AC
  Administered 2024-02-03: 1000 mL via INTRAVENOUS

## 2024-02-03 NOTE — ED Triage Notes (Signed)
 BIB niece to ED registration, [facility] is [facility] pt, here for reported confusion. Alert, NAD, calm, interactive. A&Ox3, some confusion on time. Denies pain, sob, nausea or dizziness. Denies NVD, fever, or bleeding. Endorses tired, and fatigue.

## 2024-02-03 NOTE — ED Provider Triage Note (Signed)
 Emergency Medicine Provider Triage Evaluation Note  Kathleen Stone , a 58 y.o. female  was evaluated in triage.  Pt complains of altered mental status.  Patient reports a history of pancreatic cancer currently being treated and had been on chemotherapy but had a temporary pause and will be restarting here soon.  The last few days, his had increasing confusion disorientation.  Patient is alert and oriented to self, place, equal for basic arithmetic but is unable to orient to time.  Patient's history at bedside does report the patient has recent decreased oral intake with hydration as well as food.  Review of Systems  Positive: As above Negative: As above  Physical Exam  BP (!) 146/101 (BP Location: Left Arm)   Pulse 82   Temp 99.1 F (37.3 C) (Oral)   Resp 16   Wt 79.4 kg   LMP 02/07/2016 (Exact Date)   SpO2 100%   BMI 28.25 kg/m  Gen:   Awake, no distress Resp:  Normal effort  MSK:   Moves extremities without difficulty  Other:  Alert and oriented x 3  Medical Decision Making  Medically screening exam initiated at 6:09 PM.  Appropriate orders placed.  Kathleen Stone was informed that the remainder of the evaluation will be completed by another provider, this initial triage assessment does not replace that evaluation, and the importance of remaining in the ED until their evaluation is complete.     Psalm Arman A, PA-C 02/03/24 1810

## 2024-02-03 NOTE — Telephone Encounter (Signed)
 The patient's sister called to report that the patient has not been eating or drinking adequately and has exhibited confusion. The changes in her condition began yesterday, and her confusion has worsened today. She has only consumed  [facility] and has had only one meal in the past few days. She appears very fatigued and is using her walker to assist with mobility. She is afebrile and not experiencing any reported pain. The sister requests that the patient be evaluated in the clinic for IV fluids, laboratory testing, or a provider assessment.

## 2024-02-04 ENCOUNTER — Telehealth: Payer: Self-pay | Admitting: Oncology

## 2024-02-04 DIAGNOSIS — R410 Disorientation, unspecified: Secondary | ICD-10-CM | POA: Diagnosis not present

## 2024-02-04 LAB — URINALYSIS, ROUTINE W REFLEX MICROSCOPIC
Bacteria, UA: NONE SEEN
Bilirubin Urine: NEGATIVE
Glucose, UA: NEGATIVE mg/dL
Hgb urine dipstick: NEGATIVE
Ketones, ur: NEGATIVE mg/dL
Nitrite: NEGATIVE
Protein, ur: 30 mg/dL — AB
Specific Gravity, Urine: 1.023 (ref 1.005–1.030)
pH: 5 (ref 5.0–8.0)

## 2024-02-04 MED ORDER — HEPARIN SOD (PORK) LOCK FLUSH 100 UNIT/ML IV SOLN
INTRAVENOUS | Status: AC
  Filled 2024-02-04: qty 5

## 2024-02-04 NOTE — ED Provider Notes (Signed)
 Care of patient received from prior provider at 12:01 AM, please see their note for complete H/P and care plan.  Received handoff per ED course.  Clinical Course as of 02/04/24 0001  Mon Feb 03, 2024  2251 CT Head Wo Contrast IMPRESSION: 1. Redemonstrated right mid brain mass, appears slightly increased in size compared to prior. Interval increase in size of the bilateral lateral and third ventricles concerning for interval obstruction. Increased periventricular hypodensity compared to prior head CT, suspicious for transependymal CSF extravasation. 2. Large area of low-density in the right frontal lobe, corresponding to gliosis on MRI but appears somewhat progressive in size or distribution compared to the MRI and definitely larger than the CT from 2024. Follow-up MRI with and without contrast could be helpful for further evaluation. 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 The lesion has not been directly treated. It has grown a small amount over 4 years based on radiographic dimensions. Fortunately, the patient is demonstrating no symptoms or neurological signs referable to this mass.  Unfortunately, she has had a recent development with a diagnosis of pancreatic cancer. I spoke with her oncologist, Dr. Coni Deep [phone]) at [facility] in [place]. They are continuing her treatment but he believes that the pancreatic cancer will be a life limiting diagnosis. With this in mind, I plan no neurosurgical intervention for now. I will plan to see her back in about 1 year with repeat brain MRI.   [TY]  2308 Spoke with Dr. Ellery Guthrie with neurosurgery regarding CT scan findings.  Does not seem emergent reason for hospitalization or acute neurosurgical intervention and reports that patient's and follow-up with her neurosurgeon as soon as possible at [facility]. [TY]  2359 Stable HO TJY Known panc cancer/IC cancer Soon  to start cancer Here with fatigue/decreased PO AMS Nonfocal Aox3 Workup at baseline Pending UA for possible UTI. Tolerated PO KCl [CC]    Clinical Course User Index [CC] Onetha Bile, MD [TY] Rolinda Climes, DO    Reassessment: On serial reassessment, patient has had complete symptomatic resolution.  She feels comfortable with outpatient care management follow-up with PCP.     Onetha Bile, MD 02/04/24 [id number]

## 2024-02-04 NOTE — Telephone Encounter (Signed)
 Spoke with patient.

## 2024-02-04 NOTE — ED Provider Notes (Signed)
 [place] EMERGENCY DEPARTMENT AT [facility] Provider Note   CSN: [id number] Arrival date & time: 02/03/24  1657     History  Chief Complaint  Patient presents with   Altered Mental Status    Kathleen Stone is a 58 y.o. female.  58 year old female presenting to the emergency department for confusion.  History of pancreatic cancer as well as brain cancer.  Family notes for the past week she has had decreased appetite and has been sleeping more.  Some intermittent confusion.  Patient is alert and oriented x 3.  Has no specific complaints and is asking to go home.  Denies chest pain, shortness of breath, abdominal pain, no nausea or vomiting.  Reports he just has no appetite.  Denies urinary symptoms.   Altered Mental Status      Home Medications Prior to Admission medications   Medication Sig Start Date End Date Taking? Authorizing Provider  albuterol  (VENTOLIN  HFA) 108 (90 Base) MCG/ACT inhaler Inhale 2 puffs into the lungs every 6 (six) hours as needed for wheezing or shortness of breath. Patient not taking: Reported on 01/31/2024 02/15/23   Sumner Ends, MD  amLODipine  (NORVASC ) 5 MG tablet Take 1 tablet (5 mg total) by mouth daily. 01/08/24 01/07/25  Tawkaliyar, Roya, DO  apixaban  (ELIQUIS ) 5 MG TABS tablet Take 1 tablet (5 mg total) by mouth 2 (two) times daily. 01/08/24   Tawkaliyar, Roya, DO  lidocaine -prilocaine  (EMLA ) cream Apply 1 Application topically as needed. 08/08/22   Sumner Ends, MD  lipase/protease/amylase (CREON ) 36000 UNITS CPEP capsule Take 2 capsules (72,000 Units total) by mouth 3 (three) times daily with meals. 01/09/24   Thomas, Lisa K, NP  loperamide (IMODIUM) 2 MG capsule Take 2-4 mg by mouth as needed for diarrhea or loose stools. Maximum 8/day Patient not taking: Reported on 01/31/2024    [provider]  nitroGLYCERIN  (NITROSTAT ) 0.4 MG SL tablet Place 1 tablet (0.4 mg total) under the tongue every 5 (five) minutes as needed  for chest pain. Patient not taking: Reported on 01/31/2024 11/01/21 04/11/25  Atway, Rayann N, DO  ondansetron  (ZOFRAN ) 8 MG tablet Take 1 tablet (8 mg total) by mouth every 8 (eight) hours as needed. 01/31/24   Sumner Ends, MD  pantoprazole  (PROTONIX ) 40 MG tablet Take 1 tablet (40 mg total) by mouth daily. 01/29/24   Sumner Ends, MD  potassium chloride  SA (KLOR-CON  M) 20 MEQ tablet Take 1 tablet (20 mEq total) by mouth 2 (two) times daily. 01/07/24   Roseline Conine, NP      Allergies    Iohexol     Review of Systems   Review of Systems  Physical Exam Updated Vital Signs BP (!) 124/52   Pulse 67   Temp 97.8 F (36.6 C) (Oral)   Resp 18   Wt 79.4 kg   LMP 02/07/2016 (Exact Date)   SpO2 97%   BMI 28.25 kg/m  Physical Exam Vitals and nursing note reviewed.  Constitutional:      General: She is not in acute distress.    Appearance: She is not ill-appearing.     Comments: Sleeping  HENT:     Head: Normocephalic.     Nose: Nose normal.     Mouth/Throat:     Mouth: Mucous membranes are moist.  Eyes:     Conjunctiva/sclera: Conjunctivae normal.     Pupils: Pupils are equal, round, and reactive to light.  Cardiovascular:     Rate and  Rhythm: Normal rate and regular rhythm.     Pulses: Normal pulses.  Pulmonary:     Effort: Pulmonary effort is normal.     Breath sounds: Normal breath sounds.  Abdominal:     General: Abdomen is flat. There is no distension.     Palpations: Abdomen is soft.     Tenderness: There is no abdominal tenderness. There is no guarding or rebound.  Musculoskeletal:        General: Normal range of motion.  Skin:    General: Skin is warm and dry.     Capillary Refill: Capillary refill takes less than 2 seconds.  Neurological:     General: No focal deficit present.     Mental Status: She is oriented to person, place, and time.  Psychiatric:        Mood and Affect: Mood normal.        Behavior: Behavior normal.     ED Results / Procedures /  Treatments   Labs (all labs ordered are listed, but only abnormal results are displayed) Labs Reviewed  COMPREHENSIVE METABOLIC PANEL WITH GFR - Abnormal; Notable for the following components:      Result Value   Potassium 3.0 (*)    Glucose, Bld 116 (*)    All other components within normal limits  CBG MONITORING, ED - Abnormal; Notable for the following components:   Glucose-Capillary 120 (*)    All other components within normal limits  CBG MONITORING, ED - Abnormal; Notable for the following components:   Glucose-Capillary 103 (*)    All other components within normal limits  CBC WITH DIFFERENTIAL/PLATELET  AMMONIA  URINALYSIS, ROUTINE W REFLEX MICROSCOPIC    EKG None  Radiology CT Head Wo Contrast Result Date: 02/03/2024 CLINICAL DATA:  Mental status change EXAM: CT HEAD WITHOUT CONTRAST TECHNIQUE: Contiguous axial images were obtained from the base of the skull through the vertex without intravenous contrast. RADIATION DOSE REDUCTION: This exam was performed according to the departmental dose-optimization program which includes automated exposure control, adjustment of the mA and/or kV according to patient size and/or use of iterative reconstruction technique. COMPARISON:  MRI 01/09/2024, CT brain 03/07/2023 FINDINGS: Brain: Right mid brain mass measuring 17 x 15 mm, previously 15 x 13 mm and 13 x 13 mm. Interval increase in size of the bilateral lateral and third ventricles concerning for interval obstruction. Increased periventricular hypodensity compared to prior head CT, suspicious for transependymal CSF extravasation. Large area of low-density in the right frontal lobe, corresponding to gliosis on MRI but appears somewhat progressive in size or distribution compared to the MRI and definitely larger than the CT from 2024. Other nonspecific white matter hypodensities, some of which felt secondary to chronic small vessel ischemic changes. Vascular: No hyperdense vessels.  Carotid  vascular calcification. Skull: Right anterior craniotomy.  No fracture Sinuses/Orbits: No acute finding. Other: None IMPRESSION: 1. Redemonstrated right mid brain mass, appears slightly increased in size compared to prior. Interval increase in size of the bilateral lateral and third ventricles concerning for interval obstruction. Increased periventricular hypodensity compared to prior head CT, suspicious for transependymal CSF extravasation. 2. Large area of low-density in the right frontal lobe, corresponding to gliosis on MRI but appears somewhat progressive in size or distribution compared to the MRI and definitely larger than the CT from 2024. Follow-up MRI with and without contrast could be helpful for further evaluation. Electronically Signed   By: Esmeralda Hedge M.D.   On: 02/03/2024 22:09   DG  Chest Portable 1 View Result Date: 02/03/2024 CLINICAL DATA:  AMS EXAM: PORTABLE CHEST - 1 VIEW COMPARISON:  Multiple, most recently January 06, 2024 FINDINGS: Right chest port terminates in the mid SVC. No focal airspace consolidation, pleural effusion, or pneumothorax. No cardiomegaly. No acute fracture or destructive lesion. Multilevel thoracic osteophytosis. IMPRESSION: No acute cardiopulmonary abnormality. Electronically Signed   By: Rance Burrows M.D.   On: 02/03/2024 19:20    Procedures Procedures    Medications Ordered in ED Medications  potassium chloride  SA (KLOR-CON  M) CR tablet 40 mEq (40 mEq Oral Given 02/03/24 2230)  sodium chloride  0.9 % bolus 1,000 mL (1,000 mLs Intravenous New Bag/Given 02/03/24 2226)    ED Course/ Medical Decision Making/ A&P Clinical Course as of 02/04/24 0022  Mon Feb 03, 2024  2251 CT Head Wo Contrast IMPRESSION: 1. Redemonstrated right mid brain mass, appears slightly increased in size compared to prior. Interval increase in size of the bilateral lateral and third ventricles concerning for interval obstruction. Increased periventricular hypodensity compared to  prior head CT, suspicious for transependymal CSF extravasation. 2. Large area of low-density in the right frontal lobe, corresponding to gliosis on MRI but appears somewhat progressive in size or distribution compared to the MRI and definitely larger than the CT from 2024. Follow-up MRI with and without contrast could be helpful for further evaluation. 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 The lesion has not been directly treated. It has grown a small amount over 4 years based on radiographic dimensions. Fortunately, the patient is demonstrating no symptoms or neurological signs referable to this mass.  Unfortunately, she has had a recent development with a diagnosis of pancreatic cancer. I spoke with her oncologist, Dr. Coni Deep [phone]) at [facility] in [place]. They are continuing her treatment but he believes that the pancreatic cancer will be a life limiting diagnosis. With this in mind, I plan no neurosurgical intervention for now. I will plan to see her back in about 1 year with repeat brain MRI.   [TY]  2308 Spoke with Dr. Ellery Guthrie with neurosurgery regarding CT scan findings.  Does not seem emergent reason for hospitalization or acute neurosurgical intervention and reports that patient's and follow-up with her neurosurgeon as soon as possible at [facility]. [TY]  2359 Stable HO TJY Known panc cancer/IC cancer Soon to start cancer Here with fatigue/decreased PO AMS Nonfocal Aox3 Workup at baseline Pending UA for possible UTI. Tolerated PO KCl [CC]    Clinical Course User Index [CC] Onetha Bile, MD [TY] Rolinda Climes, DO                                 Medical Decision Making This is a 58 year old female presenting emergency department for confusion.  She is afebrile nontachycardic, slightly hypertensive, maintaining oxygen saturation on room air.  She is alert and oriented x 3, is  somewhat sleepy, but answering questions appropriately.  Nonlocalizing neuroexam.  Chart reviewed; see ED course.  CT head with worsening mass.  Discussed case with neurosurgery.  Again see ED course.  She has no fever leukocytosis tachycardia to suggest systemic infection.  Potassium slightly low, otherwise electrolytes reassuring.  No transaminitis to suggest to suggest hepatic encephalopathy.  Ammonia is normal.  UA pending.  Care signed out to Dr. Urban Garden.  Dispo pending UA.Aaron Aas  Amount and/or  Complexity of Data Reviewed Independent Historian:     Details: Family member at bedside reports waxing and waning confusion.  External Data Reviewed:     Details: See ED course Labs: ordered. Decision-making details documented in ED Course. Radiology: independent interpretation performed. Decision-making details documented in ED Course.    Details: Chest x-ray without obvious pneumonia, and better interpretation.  Risk Prescription drug management.         Final Clinical Impression(s) / ED Diagnoses Final diagnoses:  None    Rx / DC Orders ED Discharge Orders     None         Rolinda Climes, DO 02/04/24 0022

## 2024-02-05 ENCOUNTER — Other Ambulatory Visit: Payer: Self-pay | Admitting: Nurse Practitioner

## 2024-02-05 ENCOUNTER — Other Ambulatory Visit: Payer: Self-pay | Admitting: Oncology

## 2024-02-05 DIAGNOSIS — C251 Malignant neoplasm of body of pancreas: Secondary | ICD-10-CM

## 2024-02-07 ENCOUNTER — Other Ambulatory Visit: Payer: Self-pay | Admitting: Oncology

## 2024-02-07 ENCOUNTER — Inpatient Hospital Stay

## 2024-02-07 ENCOUNTER — Other Ambulatory Visit: Payer: Self-pay | Admitting: Nurse Practitioner

## 2024-02-07 VITALS — BP 145/82 | HR 80 | Temp 98.1°F | Resp 18 | Wt 171.5 lb

## 2024-02-07 DIAGNOSIS — Z5111 Encounter for antineoplastic chemotherapy: Secondary | ICD-10-CM | POA: Diagnosis not present

## 2024-02-07 DIAGNOSIS — C251 Malignant neoplasm of body of pancreas: Secondary | ICD-10-CM

## 2024-02-07 DIAGNOSIS — Z95828 Presence of other vascular implants and grafts: Secondary | ICD-10-CM

## 2024-02-07 DIAGNOSIS — G9389 Other specified disorders of brain: Secondary | ICD-10-CM

## 2024-02-07 LAB — CBC WITH DIFFERENTIAL (CANCER CENTER ONLY)
Abs Immature Granulocytes: 0.04 10*3/uL (ref 0.00–0.07)
Basophils Absolute: 0 10*3/uL (ref 0.0–0.1)
Basophils Relative: 0 %
Eosinophils Absolute: 0 10*3/uL (ref 0.0–0.5)
Eosinophils Relative: 0 %
HCT: 41.5 % (ref 36.0–46.0)
Hemoglobin: 13.9 g/dL (ref 12.0–15.0)
Immature Granulocytes: 0 %
Lymphocytes Relative: 17 %
Lymphs Abs: 1.6 10*3/uL (ref 0.7–4.0)
MCH: 28.9 pg (ref 26.0–34.0)
MCHC: 33.5 g/dL (ref 30.0–36.0)
MCV: 86.3 fL (ref 80.0–100.0)
Monocytes Absolute: 0.5 10*3/uL (ref 0.1–1.0)
Monocytes Relative: 6 %
Neutro Abs: 6.9 10*3/uL (ref 1.7–7.7)
Neutrophils Relative %: 77 %
Platelet Count: 171 10*3/uL (ref 150–400)
RBC: 4.81 MIL/uL (ref 3.87–5.11)
RDW: 14.6 % (ref 11.5–15.5)
WBC Count: 9.2 10*3/uL (ref 4.0–10.5)
nRBC: 0 % (ref 0.0–0.2)

## 2024-02-07 LAB — CMP (CANCER CENTER ONLY)
ALT: 14 U/L (ref 0–44)
AST: 25 U/L (ref 15–41)
Albumin: 4 g/dL (ref 3.5–5.0)
Alkaline Phosphatase: 103 U/L (ref 38–126)
Anion gap: 14 (ref 5–15)
BUN: 11 mg/dL (ref 6–20)
CO2: 26 mmol/L (ref 22–32)
Calcium: 10 mg/dL (ref 8.9–10.3)
Chloride: 103 mmol/L (ref 98–111)
Creatinine: 0.74 mg/dL (ref 0.44–1.00)
GFR, Estimated: >60 mL/min (ref >60)
Glucose, Bld: 85 mg/dL (ref 70–99)
Potassium: 3.2 mmol/L — ABNORMAL LOW (ref 3.5–5.1)
Sodium: 143 mmol/L (ref 135–145)
Total Bilirubin: 1.1 mg/dL (ref 0.0–1.2)
Total Protein: 7.3 g/dL (ref 6.5–8.1)

## 2024-02-07 MED ORDER — SODIUM CHLORIDE 0.9 % IV SOLN: Freq: Once | INTRAVENOUS | Status: AC

## 2024-02-07 MED ORDER — PROCHLORPERAZINE MALEATE 10 MG PO TABS
10.0000 mg | ORAL_TABLET | Freq: Once | ORAL | Status: AC
  Administered 2024-02-07: 10 mg via ORAL
  Filled 2024-02-07: qty 1

## 2024-02-07 MED ORDER — SODIUM CHLORIDE 0.9 % IV SOLN
2000.0000 mg | Freq: Once | INTRAVENOUS | Status: AC
  Administered 2024-02-07: 2000 mg via INTRAVENOUS
  Filled 2024-02-07: qty 52.6

## 2024-02-07 MED ORDER — SODIUM CHLORIDE 0.9% FLUSH
10.0000 mL | INTRAVENOUS | Status: DC | PRN
Start: 2024-02-07 — End: 2024-02-07
  Administered 2024-02-07: 10 mL via INTRAVENOUS

## 2024-02-07 MED ORDER — PACLITAXEL PROTEIN-BOUND CHEMO INJECTION 100 MG
100.0000 mg/m2 | Freq: Once | INTRAVENOUS | Status: AC
  Administered 2024-02-07: 200 mg via INTRAVENOUS
  Filled 2024-02-07: qty 40

## 2024-02-07 MED ORDER — SODIUM CHLORIDE 0.9% FLUSH
10.0000 mL | INTRAVENOUS | Status: DC | PRN
  Administered 2024-02-07: 10 mL

## 2024-02-07 MED ORDER — HEPARIN SOD (PORK) LOCK FLUSH 100 UNIT/ML IV SOLN
500.0000 [IU] | Freq: Once | INTRAVENOUS | Status: AC | PRN
  Administered 2024-02-07: 500 [IU]

## 2024-02-07 NOTE — Patient Instructions (Signed)
 [facility] - A DEPT OF [facility]  Discharge Instructions: Thank you for choosing [facility] to provide your oncology and hematology care.   If you have a lab appointment with the Cancer Center, please go directly to the Cancer Center and check in at the registration area.   Wear comfortable clothing and clothing appropriate for easy access to any Portacath or PICC line.   We strive to give you quality time with your provider. You may need to reschedule your appointment if you arrive late (15 or more minutes).  Arriving late affects you and other patients whose appointments are after yours.  Also, if you miss three or more appointments without notifying the office, you may be dismissed from the clinic at the provider's discretion.      For prescription refill requests, have your pharmacy contact our office and allow 72 hours for refills to be completed.    Today you received the following chemotherapy and/or immunotherapy agents Gemzar  and Abraxane .   To help prevent nausea and vomiting after your treatment, we encourage you to take your nausea medication as directed.  BELOW ARE SYMPTOMS THAT SHOULD BE REPORTED IMMEDIATELY: *FEVER GREATER THAN 100.4 F (38 C) OR HIGHER *CHILLS OR SWEATING *NAUSEA AND VOMITING THAT IS NOT CONTROLLED WITH YOUR NAUSEA MEDICATION *UNUSUAL SHORTNESS OF BREATH *UNUSUAL BRUISING OR BLEEDING *URINARY PROBLEMS (pain or burning when urinating, or frequent urination) *BOWEL PROBLEMS (unusual diarrhea, constipation, pain near the anus) TENDERNESS IN MOUTH AND THROAT WITH OR WITHOUT PRESENCE OF ULCERS (sore throat, sores in mouth, or a toothache) UNUSUAL RASH, SWELLING OR PAIN  UNUSUAL VAGINAL DISCHARGE OR ITCHING   Items with * indicate a potential emergency and should be followed up as soon as possible or go to the Emergency Department if any problems should occur.  Please show the CHEMOTHERAPY ALERT CARD or  IMMUNOTHERAPY ALERT CARD at check-in to the Emergency Department and triage nurse.  Should you have questions after your visit or need to cancel or reschedule your appointment, please contact [facility] - A DEPT OF [facility]  Dept: [phone]  and follow the prompts.  Office hours are 8:00 a.m. to 4:30 p.m. Monday - Friday. Please note that voicemails left after 4:00 p.m. may not be returned until the following business day.  We are closed weekends and major holidays. You have access to a nurse at all times for urgent questions. Please call the main number to the clinic Dept: [phone] and follow the prompts.   For any non-urgent questions, you may also contact your provider using MyChart. We now offer e-Visits for anyone 63 and older to request care online for non-urgent symptoms. For details visit [email or web address].   Also download the MyChart app! Go to the app store, search "MyChart", open the app, select [facility], and log in with your MyChart username and password.

## 2024-02-07 NOTE — Progress Notes (Signed)
 Patients port flushed without difficulty.  Good blood return noted with no bruising or swelling noted at site.  Patient remains accessed for treatment.

## 2024-02-07 NOTE — Progress Notes (Signed)
 OK to adj Gemzar  dose to 2g today using today's BSA (rounded for vial size) per Dr. Scherrie Curt.  Serenity Batley, Pharm.D., CPP 02/07/2024@12 :33 PM

## 2024-02-08 ENCOUNTER — Emergency Department (HOSPITAL_COMMUNITY)
Admission: EM | Admit: 2024-02-08 | Discharge: 2024-02-09 | Disposition: A | Discharge status: Other Acute Inpatient Hospital | Attending: Student | Admitting: Student

## 2024-02-08 ENCOUNTER — Emergency Department (HOSPITAL_COMMUNITY)

## 2024-02-08 ENCOUNTER — Encounter (HOSPITAL_COMMUNITY): Payer: Self-pay

## 2024-02-08 ENCOUNTER — Other Ambulatory Visit: Payer: Self-pay

## 2024-02-08 DIAGNOSIS — Z8507 Personal history of malignant neoplasm of pancreas: Secondary | ICD-10-CM | POA: Insufficient documentation

## 2024-02-08 DIAGNOSIS — Z79899 Other long term (current) drug therapy: Secondary | ICD-10-CM | POA: Insufficient documentation

## 2024-02-08 DIAGNOSIS — F17210 Nicotine dependence, cigarettes, uncomplicated: Secondary | ICD-10-CM | POA: Insufficient documentation

## 2024-02-08 DIAGNOSIS — R112 Nausea with vomiting, unspecified: Secondary | ICD-10-CM | POA: Insufficient documentation

## 2024-02-08 DIAGNOSIS — G911 Obstructive hydrocephalus: Secondary | ICD-10-CM

## 2024-02-08 DIAGNOSIS — R197 Diarrhea, unspecified: Secondary | ICD-10-CM | POA: Insufficient documentation

## 2024-02-08 DIAGNOSIS — R400 Somnolence: Secondary | ICD-10-CM | POA: Insufficient documentation

## 2024-02-08 DIAGNOSIS — Z7901 Long term (current) use of anticoagulants: Secondary | ICD-10-CM | POA: Insufficient documentation

## 2024-02-08 DIAGNOSIS — Z85841 Personal history of malignant neoplasm of brain: Secondary | ICD-10-CM | POA: Insufficient documentation

## 2024-02-08 DIAGNOSIS — R410 Disorientation, unspecified: Secondary | ICD-10-CM | POA: Diagnosis not present

## 2024-02-08 DIAGNOSIS — I10 Essential (primary) hypertension: Secondary | ICD-10-CM | POA: Diagnosis not present

## 2024-02-08 LAB — CBC WITH DIFFERENTIAL/PLATELET
Abs Immature Granulocytes: 0.03 10*3/uL (ref 0.00–0.07)
Basophils Absolute: 0 10*3/uL (ref 0.0–0.1)
Basophils Relative: 0 %
Eosinophils Absolute: 0 10*3/uL (ref 0.0–0.5)
Eosinophils Relative: 0 %
HCT: 42.8 % (ref 36.0–46.0)
Hemoglobin: 14.1 g/dL (ref 12.0–15.0)
Immature Granulocytes: 0 %
Lymphocytes Relative: 11 %
Lymphs Abs: 0.9 10*3/uL (ref 0.7–4.0)
MCH: 29.1 pg (ref 26.0–34.0)
MCHC: 32.9 g/dL (ref 30.0–36.0)
MCV: 88.2 fL (ref 80.0–100.0)
Monocytes Absolute: 0.3 10*3/uL (ref 0.1–1.0)
Monocytes Relative: 4 %
Neutro Abs: 6.8 10*3/uL (ref 1.7–7.7)
Neutrophils Relative %: 85 %
Platelets: 176 10*3/uL (ref 150–400)
RBC: 4.85 MIL/uL (ref 3.87–5.11)
RDW: 14.6 % (ref 11.5–15.5)
WBC: 8.1 10*3/uL (ref 4.0–10.5)
nRBC: 0 % (ref 0.0–0.2)

## 2024-02-08 LAB — COMPREHENSIVE METABOLIC PANEL WITH GFR
ALT: 16 U/L (ref 0–44)
AST: 29 U/L (ref 15–41)
Albumin: 3.7 g/dL (ref 3.5–5.0)
Alkaline Phosphatase: 76 U/L (ref 38–126)
Anion gap: 13 (ref 5–15)
BUN: 11 mg/dL (ref 6–20)
CO2: 25 mmol/L (ref 22–32)
Calcium: 9 mg/dL (ref 8.9–10.3)
Chloride: 106 mmol/L (ref 98–111)
Creatinine, Ser: 0.98 mg/dL (ref 0.44–1.00)
GFR, Estimated: >60 mL/min (ref >60)
Glucose, Bld: 107 mg/dL — ABNORMAL HIGH (ref 70–99)
Potassium: 3.3 mmol/L — ABNORMAL LOW (ref 3.5–5.1)
Sodium: 144 mmol/L (ref 135–145)
Total Bilirubin: 2.3 mg/dL — ABNORMAL HIGH (ref 0.0–1.2)
Total Protein: 7.1 g/dL (ref 6.5–8.1)

## 2024-02-08 LAB — I-STAT CG4 LACTIC ACID, ED: Lactic Acid, Venous: 1 mmol/L (ref 0.5–1.9)

## 2024-02-08 LAB — LIPASE, BLOOD: Lipase: 20 U/L (ref 11–51)

## 2024-02-08 MED ORDER — POTASSIUM CHLORIDE 10 MEQ/100ML IV SOLN
10.0000 meq | Freq: Once | INTRAVENOUS | Status: AC
  Administered 2024-02-08: 10 meq via INTRAVENOUS
  Filled 2024-02-08: qty 100

## 2024-02-08 MED ORDER — ONDANSETRON HCL 4 MG/2ML IJ SOLN
4.0000 mg | Freq: Once | INTRAMUSCULAR | Status: AC
  Administered 2024-02-08: 4 mg via INTRAVENOUS
  Filled 2024-02-08: qty 2

## 2024-02-08 MED ORDER — LACTATED RINGERS IV BOLUS
1000.0000 mL | Freq: Once | INTRAVENOUS | Status: AC
Start: 2024-02-08 — End: 2024-02-08
  Administered 2024-02-08: 150 mL via INTRAVENOUS

## 2024-02-08 NOTE — ED Notes (Signed)
 Awaiting IV team for [facility].

## 2024-02-08 NOTE — ED Notes (Signed)
 IV team at bedside

## 2024-02-08 NOTE — ED Triage Notes (Signed)
 Pt came in via POV d/t the past couple of days feeling very lethargic & weak, confused, decreased appetite with n/v/d & family reporting that she is sleeping a lit more than normal.

## 2024-02-08 NOTE — ED Provider Triage Note (Signed)
 Emergency Medicine Provider Triage Evaluation Note  Kathleen Stone , a 58 y.o. female  was evaluated in triage.  Pt complains of nausea vomiting, disorientation, abdominal pain for 2 days.  Patient did go to chemo yesterday per family however family states that patient was calling people the wrong names at home and is acting off her baseline and is more confused.  Family states that patient had nausea vomiting diarrhea has not been to keep anything down.  Family states that patient has history of pancreatic cancer with mets to the brain and they have an MRI scheduled in the outpatient setting.  No sick contacts or fevers.  No chest pain or shortness of breath.  Review of Systems  Positive:  Negative:   Physical Exam  BP 107/64 (BP Location: Left Arm)   Pulse (!) 103   Temp 98.3 F (36.8 C) (Oral)   Resp 18   LMP 02/07/2016 (Exact Date)   SpO2 100%  Gen:   Awake, no distress   Resp:  Normal effort  MSK:   Moves extremities without difficulty  Other:  Right-sided abdominal pain that is upper and lower without peritoneal signs, alert and oriented x 3 to person place and time, pupils PERRL bilaterally, giving appropriate responses  Medical Decision Making  Medically screening exam initiated at 6:46 PM.  Appropriate orders placed.  [name] was informed that the remainder of the evaluation will be completed by another provider, this initial triage assessment does not replace that evaluation, and the importance of remaining in the ED until their evaluation is complete.  Workup initiated, patient stable at this time.   [name], PA-C 02/08/24 [phone]

## 2024-02-08 NOTE — ED Provider Notes (Signed)
  EMERGENCY DEPARTMENT AT [facility] Provider Note  CSN: [id number] Arrival date & time: 02/08/24 1818  Chief Complaint(s) Confused, Nausea, Emesis, and Diarrhea  HPI Kathleen Stone is a 58 y.o. female with PMH pancreatic cancer, brain cancer, previous CVA, HTN, HLD who presents emergency room for evaluation of confusion, somnolence, nausea, vomiting and diarrhea.  On active chemo with last infusion yesterday.  Patient was seen on 02/03/2024 in the department with similar complaints where she received a head CT showing an interval increase in bilateral and lateral third ventricle size concerning for obstruction and slightly increased right midbrain mass as well as a progressive area in the right frontal lobe.  Providers at that time spoke with neurosurgery and she did not meet inpatient criteria at that time and was encouraged to follow-up outpatient with [facility].  An MRI was ordered by her outpatient providers but is not been performed yet.  Currently denying chest pain, shortness of breath, fever or other systemic symptoms.  Alert and answering questions here in the Emergency Department today.   Past Medical History Past Medical History:  Diagnosis Date   Abnormal stress test 01/16/2017   Brain tumor (benign) (HCC)    DJD (degenerative joint disease) of cervical spine    Galactorrhea of both breasts 2009   R. sided w/ benign papilloma excised in 4/09. L sided in 5/09   Hematuria, microscopic 02/24/2020   Hyperlipidemia    Hypertension    Hypokalemia    LOW BACK PAIN SYNDROME 07/10/2007   Qualifier: Diagnosis of   By: Latrelle Pole MD, Josefina Nian         Nausea and vomiting 08/01/2022   Pancreatic cancer (HCC)    Personal history of adenomatous colonic polyp    09/2007 - diminutive adenoma Willy Harvest)   Stroke [facility])    2021- brain tumor bx caused ? 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 Authorizing Provider  albuterol  (VENTOLIN  HFA) 108 (90 Base) MCG/ACT inhaler Inhale 2 puffs into the lungs every 6 (six) hours as needed for wheezing or shortness of breath. Patient not taking: Reported on 01/31/2024 02/15/23   Sumner Ends, MD  amLODipine  (NORVASC ) 5 MG tablet Take 1 tablet (5 mg total) by mouth daily. 01/08/24 01/07/25  Tawkaliyar, Roya, DO  apixaban  (ELIQUIS ) 5 MG TABS tablet Take 1 tablet (5 mg total) by mouth 2 (two) times daily. 01/08/24   Tawkaliyar, Roya, DO  lidocaine -prilocaine  (EMLA ) cream Apply 1 Application topically  as needed. 08/08/22   Sumner Ends, MD  lipase/protease/amylase (CREON ) 36000 UNITS CPEP capsule Take 2 capsules (72,000 Units total) by mouth 3 (three) times daily with meals. 01/09/24   Thomas, Lisa K, NP  loperamide (IMODIUM) 2 MG capsule Take 2-4 mg by mouth as needed for diarrhea or loose stools. Maximum 8/day Patient not taking: Reported on 01/31/2024     [provider]  nitroGLYCERIN  (NITROSTAT ) 0.4 MG SL tablet Place 1 tablet (0.4 mg total) under the tongue every 5 (five) minutes as needed for chest pain. Patient not taking: Reported on 01/31/2024 11/01/21 04/11/25  Atway, Rayann N, DO  ondansetron  (ZOFRAN ) 8 MG tablet Take 1 tablet (8 mg total) by mouth every 8 (eight) hours as needed. 01/31/24   Sumner Ends, MD  pantoprazole  (PROTONIX ) 40 MG tablet Take 1 tablet (40 mg total) by mouth daily. 01/29/24   Sumner Ends, MD  potassium chloride  SA (KLOR-CON  M) 20 MEQ tablet Take 1 tablet (20 mEq total) by mouth 2 (two) times daily. 01/07/24   Roseline Conine, NP                                                                                                                                    Past Surgical History Past Surgical History:  Procedure Laterality Date   APPENDECTOMY     BIOPSY  08/02/2022   Procedure: BIOPSY;  Surgeon: Brice Campi Albino Alu., MD;  Location: [facility];  Service: Gastroenterology;;   BIOPSY  04/17/2023   Procedure: BIOPSY;  Surgeon: Normie Becton., MD;  Location: [facility];  Service: Gastroenterology;;   BRAIN BIOPSY     [id number]   BREAST SURGERY Right 12/2007   central duct excision   CHOLECYSTECTOMY N/A 12/08/2019   Procedure: LAPAROSCOPIC CHOLECYSTECTOMY;  Surgeon: Shela Derby, MD;  Location[facility] OR;  Service: General;  Laterality: N/A;   COLONOSCOPY  2009   COLONOSCOPY  09/14/2020   COLONOSCOPY W/ BIOPSIES AND POLYPECTOMY  09/2007   Dr. Willy Harvest. 2 small adenomatous polyps, internal hemorrhoids-Grade 1. Rec  routine colonoscopy at age 44.   COLONOSCOPY W/ BIOPSIES AND POLYPECTOMY     ESOPHAGOGASTRODUODENOSCOPY (EGD) WITH PROPOFOL  N/A 08/02/2022   Procedure: ESOPHAGOGASTRODUODENOSCOPY (EGD) WITH PROPOFOL ;  Surgeon: Brice Campi Albino Alu., MD;  Location: [facility];  Service: Gastroenterology;  Laterality: N/A;   ESOPHAGOGASTRODUODENOSCOPY (EGD) WITH PROPOFOL  N/A 04/17/2023    Procedure: ESOPHAGOGASTRODUODENOSCOPY (EGD) WITH PROPOFOL ;  Surgeon: Brice Campi Albino Alu., MD;  Location: [facility];  Service: Gastroenterology;  Laterality: N/A;   EUS N/A 08/02/2022   Procedure: FULL UPPER ENDOSCOPIC ULTRASOUND (EUS) RADIAL;  Surgeon: Brice Campi Albino Alu., MD;  Location: [facility];  Service: Gastroenterology;  Laterality: N/A;   EUS N/A 04/17/2023   Procedure: UPPER ENDOSCOPIC ULTRASOUND (EUS) RADIAL;  Surgeon: Normie Becton., MD;  Location: [facility];  Service: Gastroenterology;  Laterality: N/A;   FIDUCIAL MARKER PLACEMENT N/A 04/17/2023   Procedure: FIDUCIAL  MARKER PLACEMENT;  Surgeon: Mansouraty, Albino Alu., MD;  Location: [facility];  Service: Gastroenterology;  Laterality: N/A;   FINE NEEDLE ASPIRATION N/A 08/02/2022   Procedure: FINE NEEDLE ASPIRATION (FNA) LINEAR;  Surgeon: Normie Becton., MD;  Location: [facility];  Service: Gastroenterology;  Laterality: N/A;   IR IMAGING GUIDED PORT INSERTION  08/03/2022   NASAL SEPTOPLASTY W/ TURBINOPLASTY Bilateral 04/2008   Septoplasty with bilateral inferior turbinate reduction; Dr. Odean Bend   POLYPECTOMY  08/02/2022   Procedure: POLYPECTOMY;  Surgeon: Normie Becton., MD;  Location: [facility];  Service: Gastroenterology;;   SHOULDER OPEN ROTATOR CUFF REPAIR Right     after hurting it by moving heavy objecs   Family History Family History  Problem Relation Age of Onset   Heart failure Mother    Diabetes Mother    Lung cancer Father        dx 55s-80s   Breast cancer Maternal Aunt        d. 70   Pancreatic cancer Maternal Aunt        d. 64   Prostate cancer Maternal Uncle        d. 68   Pancreatic cancer Maternal Grandfather        d. 39s   Breast cancer Cousin        dx 78s; maternal female cousin   Stomach cancer Cousin        d. 7; maternal female cousin   Pancreatic cancer Cousin        d. 35s; maternal female cousin   Colon cancer Neg Hx    Colon polyps Neg Hx     Esophageal cancer Neg Hx    Rectal cancer Neg Hx     Social History Social History   Tobacco Use   Smoking status: Every Day    Current packs/day: 1.50    Average packs/day: 1.5 packs/day for 36.0 years (54.0 ttl pk-yrs)    Types: Cigarettes   Smokeless tobacco: Never  Vaping Use   Vaping status: Former  Substance Use Topics   Alcohol use: No    Alcohol/week: 0.0 standard drinks of alcohol   Drug use: No   Allergies Iohexol   Review of Systems Review of Systems  Gastrointestinal:  Positive for diarrhea, nausea and vomiting.  Psychiatric/Behavioral:  Positive for confusion.     Physical Exam Vital Signs  I have reviewed the triage vital signs BP 107/64 (BP Location: Left Arm)   Pulse (!) 103   Temp 98.3 F (36.8 C) (Oral)   Resp 18   Ht 5\' 6"  (1.676 m)   Wt 78.9 kg   LMP 02/07/2016 (Exact Date)   SpO2 100%   BMI 28.08 kg/m   Physical Exam Vitals and nursing note reviewed.  Constitutional:      General: She is not in acute distress.    Appearance: She is well-developed.  HENT:     Head: Normocephalic and atraumatic.  Eyes:     Conjunctiva/sclera: Conjunctivae normal.  Pulmonary:     Effort: Pulmonary effort is normal. No respiratory distress.  Musculoskeletal:        General: No swelling.     Cervical back: Neck supple.  Skin:    General: Skin is warm and dry.     Capillary Refill: Capillary refill takes less than 2 seconds.  Neurological:     Mental Status: She is alert.  Psychiatric:        Mood and Affect: Mood normal.     ED  Results and Treatments Labs (all labs ordered are listed, but only abnormal results are displayed) Labs Reviewed  COMPREHENSIVE METABOLIC PANEL WITH GFR - Abnormal; Notable for the following components:      Result Value   Potassium 3.3 (*)    Glucose, Bld 107 (*)    Total Bilirubin 2.3 (*)    All other components within normal limits  CBC WITH DIFFERENTIAL/PLATELET  LIPASE, BLOOD  URINALYSIS, ROUTINE W REFLEX  MICROSCOPIC  I-STAT CG4 LACTIC ACID, ED                                                                                                                          Radiology No results found.  Pertinent labs & imaging results that were available during my care of the patient were reviewed by me and considered in my medical decision making (see MDM for details).  Medications Ordered in ED Medications - No data to display                                                                                                                                   Procedures Procedures  (including critical care time)  Medical Decision Making / ED Course   This patient presents to the ED for concern of somnolence, nausea, vomiting, this involves an extensive number of treatment options, and is a complaint that carries with it a high risk of complications and morbidity.  The differential diagnosis includes infection, metabolic/toxic encephalopathy, hypoglycemia, malperfusion, hypoxia, trauma or other intracranial process  MDM: Patient seen emergency room for evaluation of excessive somnolence, nausea, vomiting.  Physical exam is largely unremarkable and patient does not excessively somnolent here in the emergency department.  On laboratory evaluation, potassium 3.3 which was repleted, total bili 2.3 but is otherwise unremarkable.  CT head showing stable findings from Monday in regards to her brain tumor in and CT abdomen and pelvis again showing her pancreatic malignancy but is otherwise unremarkable.  At time of signout, patient pending MRI brain with and without contrast.  Please see provider signout of continuation of workup.   Additional history obtained: -Additional history obtained from family member -External records from outside source obtained and reviewed including: Chart review including previous notes, labs, imaging, consultation notes   Lab Tests: -I ordered, reviewed, and interpreted labs.    The pertinent results include:   Labs Reviewed  COMPREHENSIVE METABOLIC PANEL WITH  GFR - Abnormal; Notable for the following components:      Result Value   Potassium 3.3 (*)    Glucose, Bld 107 (*)    Total Bilirubin 2.3 (*)    All other components within normal limits  CBC WITH DIFFERENTIAL/PLATELET  LIPASE, BLOOD  URINALYSIS, ROUTINE W REFLEX MICROSCOPIC  I-STAT CG4 LACTIC ACID, ED       Imaging Studies ordered: I ordered imaging studies including CT head, CT abdomen pelvis I independently visualized and interpreted imaging. I agree with the radiologist interpretation  MRI brain pending   Medicines ordered and prescription drug management: No orders of the defined types were placed in this encounter.   -I have reviewed the patients home medicines and have made adjustments as needed  Critical interventions none   Cardiac Monitoring: The patient was maintained on a cardiac monitor.  I personally viewed and interpreted the cardiac monitored which showed an underlying rhythm of: NSR  Social Determinants of Health:  Factors impacting patients care include: none   Reevaluation: After the interventions noted above, I reevaluated the patient and found that they have :improved  Co morbidities that complicate the patient evaluation  Past Medical History:  Diagnosis Date   Abnormal stress test 01/16/2017   Brain tumor (benign) (HCC)    DJD (degenerative joint disease) of cervical spine    Galactorrhea of both breasts 2009   R. sided w/ benign papilloma excised in 4/09. L sided in 5/09   Hematuria, microscopic 02/24/2020   Hyperlipidemia    Hypertension    Hypokalemia    LOW BACK PAIN SYNDROME 07/10/2007   Qualifier: Diagnosis of   By: Latrelle Pole MD, Josefina Nian         Nausea and vomiting 08/01/2022   Pancreatic cancer [facility])    Personal history of adenomatous colonic polyp    09/2007 - diminutive adenoma Wil[facility] Harvest)   Stroke [facility])    2021- brain tumor bx caused ? stroke-     Tobacco abuse    Unintentional weight loss 05/26/2022      Dispostion: I considered admission for this patient, and disposition pending completion of MRI.  Please see provider signout note for continuation of workup.     Final Clinical Impression(s) / ED Diagnoses Final diagnoses:  None     @PCDICTATION @    Karlyn Overman, MD 02/09/24 [phone]

## 2024-02-09 ENCOUNTER — Other Ambulatory Visit: Payer: Self-pay

## 2024-02-09 ENCOUNTER — Emergency Department (HOSPITAL_COMMUNITY)

## 2024-02-09 DIAGNOSIS — R112 Nausea with vomiting, unspecified: Secondary | ICD-10-CM | POA: Diagnosis not present

## 2024-02-09 LAB — URINALYSIS, ROUTINE W REFLEX MICROSCOPIC
Bilirubin Urine: NEGATIVE
Glucose, UA: NEGATIVE mg/dL
Ketones, ur: 20 mg/dL — AB
Leukocytes,Ua: NEGATIVE
Nitrite: NEGATIVE
Protein, ur: 30 mg/dL — AB
Specific Gravity, Urine: 1.025 (ref 1.005–1.030)
pH: 5 (ref 5.0–8.0)

## 2024-02-09 MED ORDER — GADOBUTROL 1 MMOL/ML IV SOLN
8.0000 mL | Freq: Once | INTRAVENOUS | Status: AC | PRN
  Administered 2024-02-09: 8 mL via INTRAVENOUS

## 2024-02-09 MED ORDER — DEXAMETHASONE SODIUM PHOSPHATE 4 MG/ML IJ SOLN
4.0000 mg | Freq: Four times a day (QID) | INTRAMUSCULAR | Status: DC
  Administered 2024-02-09: 4 mg via INTRAVENOUS
  Filled 2024-02-09: qty 1

## 2024-02-09 MED ORDER — ONDANSETRON HCL 4 MG/2ML IJ SOLN
4.0000 mg | Freq: Four times a day (QID) | INTRAMUSCULAR | Status: DC | PRN
  Administered 2024-02-09 (×2): 4 mg via INTRAVENOUS
  Filled 2024-02-09 (×2): qty 2

## 2024-02-09 MED ORDER — NICOTINE 21 MG/24HR TD PT24: 21.0000 mg | MEDICATED_PATCH | Freq: Every day | TRANSDERMAL | Status: DC

## 2024-02-09 NOTE — ED Provider Notes (Signed)
 Care assumed from Dr. Jeannie Milo.  Patient with a history of pancreatic cancer, brain cancer, previous CVA here with confusion, somnolence, vomiting and diarrhea.  Recently evaluated for the same on May 19. CT findings are stable.  Awaiting MRI.  IMPRESSION: 1. 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 2. 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    Discussed with Darryl Endow PA-C for neurosurgery.  No emergent intervention recommended for this hydrocephalus.  Would plan to involve her [facility] neurosurgeon before any intervention.  Will give steroids overnight and monitor.  No need for emergent transfer tonight or other emergent intervention.  Discussed with internal medicine residents.  Darryl Endow PA-C now states patient should be transferred to [facility] versus being admitted here.  D/w [facility] Dr. Sherman Divers of neuro surgery.  He accepts patient for further evaluation.  Agrees with steroids but no other intervention necessary at this time.  Patient remained stable.  Discussed with patient and family who are agreeable to transfer.  Updated internal medicine residents Dr. Meredeth Stallion.  .Critical Care  Performed by: Earma Gloss, MD Authorized by: Earma Gloss, MD   Critical care provider statement:    Critical care time (minutes):  45   Critical care time was exclusive of:  Separately billable procedures and treating other patients   Critical care was time spent personally by me on the following activities:  Development of treatment plan with patient or surrogate, discussions with consultants, evaluation of patient's response to treatment, examination of patient, ordering and review of laboratory studies, ordering and review of radiographic studies, ordering and performing treatments and interventions, pulse oximetry, re-evaluation of patient's condition, review of old charts, blood draw for  specimens and obtaining history from patient or surrogate   I assumed direction of critical care for this patient from another provider in my specialty: no     Care discussed with: admitting provider       Earma Gloss, MD 02/09/24 [phone]

## 2024-02-09 NOTE — ED Notes (Signed)
 Patient transported to MRI

## 2024-02-09 NOTE — ED Notes (Signed)
 ED Provider at bedside.

## 2024-02-11 ENCOUNTER — Telehealth: Payer: Self-pay | Admitting: *Deleted

## 2024-02-11 ENCOUNTER — Telehealth: Payer: Self-pay

## 2024-02-11 NOTE — Progress Notes (Signed)
 Upon chart review it was determined patient is currently admitted to [facility] since 5/25. Canceled upcoming call with Augustin Leber RNCM on 5/27.  Barnie Bora  [facility]  [facility], [facility] Guide  Direct Dial: [phone]  Fax [phone]

## 2024-02-14 ENCOUNTER — Other Ambulatory Visit: Payer: Self-pay | Admitting: Oncology

## 2024-02-14 ENCOUNTER — Telehealth: Payer: Self-pay

## 2024-02-14 NOTE — Transitions of Care (Post Inpatient/ED Visit) (Signed)
 02/14/2024  Name: Kathleen Stone MRN: [id number] DOB: 05/02/1966  Today's TOC FU Call Status: Today's TOC FU Call Status:: Successful TOC FU Call Completed TOC FU Call Complete Date: 02/14/24 Patient's Name and Date of Birth confirmed.  Transition Care Management Follow-up Telephone Call Date of Discharge: 02/13/24 Discharge Facility: Other [facility]) Name of Other ([facility]) Discharge Facility: [facility] Type of Discharge: Inpatient Admission Primary Inpatient Discharge Diagnosis:: neoplasm of brain How have you been since you were released from the hospital?: Better Any questions or concerns?: No  Items Reviewed: Did you receive and understand the discharge instructions provided?: Yes Medications obtained,verified, and reconciled?: Yes (Medications Reviewed) Any new allergies since your discharge?: No Dietary orders reviewed?: Yes Do you have support at home?: Yes People in Home [RPT]: other relative(s)  Medications Reviewed Today: Medications Reviewed Today     Reviewed by Darrall Ellison, LPN (Licensed Practical Nurse) on 02/14/24 at [phone]  Med List Status: <None>   Medication Order Taking? Sig Documenting Provider Last Dose Status Informant  acetaminophen  (TYLENOL ) 325 MG tablet [id number] Yes Take by mouth every 6 (six) hours as needed. [provider] Taking Active   albuterol  (VENTOLIN  HFA) 108 (90 Base) MCG/ACT inhaler [id number] Yes Inhale 2 puffs into the lungs every 6 (six) hours as needed for wheezing or shortness of breath. Sumner Ends, MD Taking Active   amLODipine  (NORVASC ) 5 MG tablet [id number] Yes Take 1 tablet (5 mg total) by mouth daily. Lanney Pitts, DO Taking Active   apixaban  (ELIQUIS ) 5 MG TABS tablet [id number] No Take 1 tablet (5 mg total) by mouth 2 (two) times daily.  Patient not taking: Reported on 02/14/2024   Lanney Pitts, DO Not Taking Active            Med Note Acie Acosta, Raynald Calkins   Fri Jan 31, 2024  9:41 AM)     lidocaine -prilocaine  (EMLA ) cream [id number] Yes Apply 1 Application topically as needed. Sumner Ends, MD Taking Active   lipase/protease/amylase (CREON ) 36000 UNITS CPEP capsule [id number] Yes Take 2 capsules (72,000 Units total) by mouth 3 (three) times daily with meals. Roseline Conine, NP Taking Active   loperamide (IMODIUM) 2 MG capsule [id number] Yes Take 2-4 mg by mouth as needed for diarrhea or loose stools. Maximum 8/day [provider] Taking Active   nitroGLYCERIN  (NITROSTAT ) 0.4 MG SL tablet [id number] Yes Place 1 tablet (0.4 mg total) under the tongue every 5 (five) minutes as needed for chest pain. Atway, Rayann N, DO Taking Active Self  ondansetron  (ZOFRAN ) 8 MG tablet [id number] Yes Take 1 tablet (8 mg total) by mouth every 8 (eight) hours as needed. Sumner Ends, MD Taking Active   oxyCODONE  (OXY IR/ROXICODONE ) 5 MG immediate release tablet [id number] Yes Take 5 mg by mouth every 4 (four) hours as needed. [provider] Taking Active   pantoprazole  (PROTONIX ) 40 MG tablet [id number] Yes Take 1 tablet (40 mg total) by mouth daily. Sumner Ends, MD Taking Active   polyethylene glycol (MIRALAX / GLYCOLAX) 17 g packet [id number] Yes Take by mouth daily. [provider] Taking Active   potassium chloride  SA (KLOR-CON  M) 20 MEQ tablet [id number] Yes Take 1 tablet (20 mEq total) by mouth 2 (two) times daily. Roseline Conine, NP Taking Active   senna (SENOKOT) 8.6 MG tablet [id number] Yes Take 1 tablet by mouth daily. [provider] Taking Active             Home Care and Equipment/Supplies:  Were Home Health Services Ordered?: NA Any new equipment or medical supplies ordered?: NA  Functional Questionnaire: Do you need assistance with bathing/showering or dressing?: No Do you need assistance with meal preparation?: No Do you need assistance with eating?: No Do you have difficulty maintaining continence: No Do you need assistance with getting  out of bed/getting out of a chair/moving?: No Do you have difficulty managing or taking your medications?: No  Follow up appointments reviewed: PCP Follow-up appointment confirmed?: No (sent message to staff to schedule) MD Provider Line Number:[phone] Given: No Specialist Hospital Follow-up appointment confirmed?: No Reason Specialist Follow-Up Not Confirmed: Patient has Specialist Provider Number and will Call for Appointment Do you need transportation to your follow-up appointment?: No Do you understand care options if your condition(s) worsen?: Yes-patient verbalized understanding    SIGNATURE Darrall Ellison, LPN [facility] Nurse Health Advisor Direct Dial [phone]

## 2024-02-19 ENCOUNTER — Other Ambulatory Visit: Payer: Self-pay

## 2024-02-21 ENCOUNTER — Inpatient Hospital Stay: Attending: Oncology

## 2024-02-21 ENCOUNTER — Inpatient Hospital Stay (HOSPITAL_BASED_OUTPATIENT_CLINIC_OR_DEPARTMENT_OTHER): Admitting: Nurse Practitioner

## 2024-02-21 ENCOUNTER — Inpatient Hospital Stay

## 2024-02-21 ENCOUNTER — Encounter: Payer: Self-pay | Admitting: Nurse Practitioner

## 2024-02-21 ENCOUNTER — Other Ambulatory Visit: Payer: Self-pay | Admitting: *Deleted

## 2024-02-21 VITALS — BP 156/98 | HR 65 | Temp 97.4°F | Resp 18

## 2024-02-21 VITALS — BP 143/68 | HR 78 | Temp 98.1°F | Resp 18 | Ht 66.0 in | Wt 177.2 lb

## 2024-02-21 DIAGNOSIS — E876 Hypokalemia: Secondary | ICD-10-CM | POA: Insufficient documentation

## 2024-02-21 DIAGNOSIS — C251 Malignant neoplasm of body of pancreas: Secondary | ICD-10-CM

## 2024-02-21 DIAGNOSIS — Z85841 Personal history of malignant neoplasm of brain: Secondary | ICD-10-CM | POA: Diagnosis not present

## 2024-02-21 DIAGNOSIS — Z95828 Presence of other vascular implants and grafts: Secondary | ICD-10-CM

## 2024-02-21 LAB — CBC WITH DIFFERENTIAL (CANCER CENTER ONLY)
Abs Immature Granulocytes: 0.15 10*3/uL — ABNORMAL HIGH (ref 0.00–0.07)
Basophils Absolute: 0 10*3/uL (ref 0.0–0.1)
Basophils Relative: 0 %
Eosinophils Absolute: 0 10*3/uL (ref 0.0–0.5)
Eosinophils Relative: 1 %
HCT: 34.6 % — ABNORMAL LOW (ref 36.0–46.0)
Hemoglobin: 11.4 g/dL — ABNORMAL LOW (ref 12.0–15.0)
Immature Granulocytes: 2 %
Lymphocytes Relative: 24 %
Lymphs Abs: 1.6 10*3/uL (ref 0.7–4.0)
MCH: 29.4 pg (ref 26.0–34.0)
MCHC: 32.9 g/dL (ref 30.0–36.0)
MCV: 89.2 fL (ref 80.0–100.0)
Monocytes Absolute: 0.6 10*3/uL (ref 0.1–1.0)
Monocytes Relative: 9 %
Neutro Abs: 4.3 10*3/uL (ref 1.7–7.7)
Neutrophils Relative %: 64 %
Platelet Count: 178 10*3/uL (ref 150–400)
RBC: 3.88 MIL/uL (ref 3.87–5.11)
RDW: 15.7 % — ABNORMAL HIGH (ref 11.5–15.5)
WBC Count: 6.8 10*3/uL (ref 4.0–10.5)
nRBC: 0 % (ref 0.0–0.2)

## 2024-02-21 LAB — CMP (CANCER CENTER ONLY)
ALT: 16 U/L (ref 0–44)
AST: 22 U/L (ref 15–41)
Albumin: 3.4 g/dL — ABNORMAL LOW (ref 3.5–5.0)
Alkaline Phosphatase: 102 U/L (ref 38–126)
Anion gap: 10 (ref 5–15)
BUN: 5 mg/dL — ABNORMAL LOW (ref 6–20)
CO2: 27 mmol/L (ref 22–32)
Calcium: 8.9 mg/dL (ref 8.9–10.3)
Chloride: 108 mmol/L (ref 98–111)
Creatinine: 0.68 mg/dL (ref 0.44–1.00)
GFR, Estimated: >60 mL/min (ref >60)
Glucose, Bld: 118 mg/dL — ABNORMAL HIGH (ref 70–99)
Potassium: 2.7 mmol/L — CL (ref 3.5–5.1)
Sodium: 145 mmol/L (ref 135–145)
Total Bilirubin: 0.3 mg/dL (ref 0.0–1.2)
Total Protein: 6.1 g/dL — ABNORMAL LOW (ref 6.5–8.1)

## 2024-02-21 MED ORDER — POTASSIUM CHLORIDE CRYS ER 20 MEQ PO TBCR
20.0000 meq | EXTENDED_RELEASE_TABLET | ORAL | 2 refills | Status: DC
Start: 2024-02-21 — End: 2024-04-29

## 2024-02-21 MED ORDER — POTASSIUM CHLORIDE 10 MEQ/100ML IV SOLN
10.0000 meq | INTRAVENOUS | Status: AC
  Administered 2024-02-21 (×3): 10 meq via INTRAVENOUS
  Filled 2024-02-21 (×3): qty 100

## 2024-02-21 MED ORDER — SODIUM CHLORIDE 0.9 % IV SOLN: Freq: Once | INTRAVENOUS | Status: AC

## 2024-02-21 MED ORDER — SODIUM CHLORIDE 0.9% FLUSH
10.0000 mL | Freq: Once | INTRAVENOUS | Status: AC
  Administered 2024-02-21: 10 mL

## 2024-02-21 MED ORDER — HEPARIN SOD (PORK) LOCK FLUSH 100 UNIT/ML IV SOLN
500.0000 [IU] | Freq: Once | INTRAVENOUS | Status: AC
  Administered 2024-02-21: 500 [IU]

## 2024-02-21 MED ORDER — POTASSIUM CHLORIDE 10 MEQ/100ML IV SOLN
10.0000 meq | INTRAVENOUS | Status: DC
  Filled 2024-02-21 (×3): qty 100

## 2024-02-21 NOTE — Patient Instructions (Signed)
 Your potassium level is very low. Please resume your oral potassium chloride  at home: 6/6: Take 20 meq tonight 6/7: Take 20 meq twice daily x 3 days 6/10: Take 20 meq daily

## 2024-02-21 NOTE — Progress Notes (Signed)
 [facility] OFFICE PROGRESS NOTE   Diagnosis: Pancreas cancer  INTERVAL HISTORY:   Kathleen Stone returns as scheduled.  She completed cycle 1 gemcitabine /Abraxane  02/07/2024.  She presented to the emergency department 02/08/2024 with confusion, somnolence, nausea, vomiting and diarrhea.  Brain MRI showed interval development of hydrocephalus with transependymal flow of CSF.  Similar size of of enhancing mass in the dorsal right midbrain effacing the cerebral aqueduct.  No new enhancing lesions identified.  She was transferred to [facility].  She underwent an endoscopic third ventriculostomy on 02/12/2024.  She is feeling much better.  She no longer feels confused.  No balance issues.  She denies nausea/vomiting.  No mouth sores.  No diarrhea.  No fever or rash.  Objective:  Vital signs in last 24 hours:  Blood pressure (!) 143/68, pulse 78, temperature 98.1 F (36.7 C), temperature source Temporal, resp. rate 18, height 5\' 6"  (1.676 m), weight 177 lb 3.2 oz (80.4 kg), last menstrual period 02/07/2016, SpO2 100%.    HEENT: No thrush or ulcers. Resp: Lungs clear bilaterally. Cardio: Regular rate and rhythm. GI: No hepatosplenomegaly. Vascular: No leg edema. Neuro: Alert and oriented. Port-A-Cath without erythema.  Lab Results:  Lab Results  Component Value Date   WBC 6.8 02/21/2024   HGB 11.4 (L) 02/21/2024   HCT 34.6 (L) 02/21/2024   MCV 89.2 02/21/2024   PLT 178 02/21/2024   NEUTROABS 4.3 02/21/2024    Imaging:  No results found.  Medications: I have reviewed the patient's current medications.  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 CT abdomen/pelvis 07/30/2022-body/neck pancreas mass with peripheral pancreatic atrophy and duct dilation, suspected encasement of the celiac axis, uterine fibroids MRI abdomen 07/30/2022-3.7 x 4.7 cm pancreas body/tail mass with encasement of the proximal celiac artery, occlusion with collateralization at the confluence of the SMV and  portal vein, no evidence of metastatic disease CT chest 08/01/2022-no intrathoracic metastases Elevated CA 19-9 EUS 08/02/2022-gastric polyp, gastric erosions, 32 x 33 mm pancreas body mass, invasion of the celiac trunk, invasion of the splenoportal confluence, no SMA invasion, 2 malignant appearing nodes in the porta hepatis,T4N1 by endoscopic criteria, FNA biopsy-adenocarcinoma 08/17/2022 [employer]-tumor mutation burden 0.96; MSI high not detected; 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  Stable porta hepatis lymph nodes. Cycle 7 gemcitabine /Abraxane  11/09/2022 Cycle 8 gemcitabine /Abraxane  11/23/2022 Cycle 9 gemcitabine /Abraxane  12/07/2022 Cycle 10 gemcitabine /Abraxane  12/21/2022 Cycle 11 gemcitabine /Abraxane  01/04/2023 Cycle 12 gemcitabine /Abraxane  01/18/2023 Cycle 13 gemcitabine /Abraxane  02/01/2023 Cycle 14 gemcitabine /Abraxane  02/15/2023 CT abdomen/pelvis 02/26/2023-stable appearance of pancreas body mass with vascular encasement and chronic occlusion of the splenic vein, no evidence of metastatic disease Cycle 15 gemcitabine /Abraxane  03/01/2023 Cycle 16 gemcitabine /Abraxane  03/15/2023 Cycle 17 Gemcitabine /Abraxane  03/29/2023 EUS pancreas fiducial marker placement 04/17/2023 Pancreas SBRT 05/07/2023 - 05/17/2023, 33 Gray in 5 fractions CT abdomen/pelvis 09/14/2023: Slight decrease in pancreas body mass with persistent vascular encasement, no evidence of metastatic disease CTs [facility] 01/02/2024-ill-defined hypoattenuating mass in the mid pancreatic body surrounding surgical clips.   Pancreatic duct within the distal pancreatic body and tail is dilated.  Mass encases the celiac trunk.  Common bile duct is dilated with suggestion of focal stenosis in the mid bile duct.  High-grade stenosis/occlusion of the portal vein.  Prominent vessels in the upper  abdomen.  Heterogeneous appearance of the splenic parenchyma with suggestion of an infiltrating hypoattenuating mass/masses versus sequela of splenic infarcts.  Multiple pulmonary nodules in the lung bases concerning for metastatic disease. CTs chest/abdomen/pelvis 01/06/2024-no PE.  Cardiomegaly with small pericardial effusion.  Small irregular focus of density medial right lung base.  Poorly defined pancreatic body mass measuring 3 x 2.2 cm, previously 3 x 2.5 cm, possible extension of infiltrative soft tissue towards the pancreatic neck region with ill-defined density at the porta hepatis compared to prior.  Slight increased extrahepatic biliary and common duct dilatation compared to prior.  No focal splenic abnormality. Cycle 1 gemcitabine /carboplatin 02/07/2024     2.  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  Similar size of of enhancing mass in the dorsal right midbrain effacing the cerebral aqueduct.  No new enhancing lesions identified.  She was transferred to [facility].  She underwent an endoscopic third ventriculostomy on 02/12/2024.   3.  Anorexia/weight loss secondary to #1 4.  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  Abdominal pain 6.  History of colon polyps 7.  Family history of multiple cancers Pancreas cancer maternal aunt, maternal cousin, maternal grandfather Breast cancer-maternal aunt, maternal cousin Prostate cancer-maternal uncle Lung cancer-father 8.  Hypertension 9.  Postmenopausal vaginal bleeding 10.  H. pylori 08/02/2022-amoxicillin  and Flagyl   discontinued 08/21/2022 due to GI symptoms 11.  Syncope event 03/07/2023 12.  Eliquis  initiated 01/02/2024 for "high-grade stenosis/occlusion of the portal vein"    Disposition: Kathleen Stone appears stable.  She completed cycle 1 gemcitabine /Abraxane  02/07/2024.  She was seen in the emergency department on 02/08/2024 with altered mental status, nausea, vomiting, diarrhea.  Brain MRI showed hydrocephalus.  She was transferred to [facility] and underwent endoscopic third ventriculostomy on 02/12/2024.  She was discharged home 02/13/2024.  She appears well today.  We decided to hold cycle 2 until she is seen in follow-up by neurosurgery next week.  Plan to resume chemotherapy once cleared by neurosurgery.  CBC and chemistry panel reviewed.  She has persistent hypokalemia.  She confirms she is not taking oral potassium as noted on her medication list.  She will receive IV potassium today and resume oral potassium.  Plan for repeat labs when she is here on 02/26/2024.  She will return for an office visit and cycle 2 gemcitabine /Abraxane  on 02/26/2024.  We are available to see her sooner if needed.    Diana Forster ANP/GNP-BC   02/21/2024  9:28 AM

## 2024-02-21 NOTE — Patient Instructions (Signed)
 [facility] - A DEPT OF [facility]  Discharge Instructions: Thank you for choosing [facility] to provide your oncology and hematology care.   If you have a lab appointment with the Cancer Center, please go directly to the Cancer Center and check in at the registration area.   Wear comfortable clothing and clothing appropriate for easy access to any Portacath or PICC line.   We strive to give you quality time with your provider. You may need to reschedule your appointment if you arrive late (15 or more minutes).  Arriving late affects you and other patients whose appointments are after yours.  Also, if you miss three or more appointments without notifying the office, you may be dismissed from the clinic at the provider's discretion.      For prescription refill requests, have your pharmacy contact our office and allow 72 hours for refills to be completed.    Today you received the following Potassium Chloride .  Potassium Chloride  Injection What is this medication? POTASSIUM CHLORIDE  (poe TASS i um KLOOR ide) prevents and treats low levels of potassium in your body. Potassium plays an important role in maintaining the health of your kidneys, heart, muscles, and nervous system. This medicine may be used for other purposes; ask your health care provider or pharmacist if you have questions. COMMON BRAND NAME(S): PROAMP What should I tell my care team before I take this medication? They need to know if you have any of these conditions: Addison disease Dehydration Diabetes (high blood sugar) Heart disease High levels of potassium in the blood Irregular heartbeat or rhythm Kidney disease Large areas of burned skin An unusual or allergic reaction to potassium, other medications, foods, dyes, or preservatives Pregnant or trying to get pregnant Breast-feeding How should I use this medication? This medication is injected into a vein. It is given in  a hospital or clinic setting. Talk to your care team about the use of this medication in children. Special care may be needed. Overdosage: If you think you have taken too much of this medicine contact a poison control center or emergency room at once. NOTE: This medicine is only for you. Do not share this medicine with others. What if I miss a dose? This does not apply. This medication is not for regular use. What may interact with this medication? Do not take this medication with any of the following: Certain diuretics, such as spironolactone, triamterene Eplerenone Sodium polystyrene sulfonate This medication may also interact with the following: Certain medications for blood pressure or heart disease, such as lisinopril, losartan , quinapril, valsartan Medications that lower your chance of fighting infection, such as cyclosporine, tacrolimus NSAIDs, medications for pain and inflammation, such as ibuprofen or naproxen  Other potassium supplements Salt substitutes This list may not describe all possible interactions. Give your health care provider a list of all the medicines, herbs, non-prescription drugs, or dietary supplements you use. Also tell them if you smoke, drink alcohol, or use illegal drugs. Some items may interact with your medicine. What should I watch for while using this medication? Visit your care team for regular checks on your progress. Tell your care team if your symptoms do not start to get better or if they get worse. You may need blood work while you are taking this medication. Avoid salt substitutes unless you are told otherwise by your care team. What side effects may I notice from receiving this medication? Side effects that you should report  to your care team as soon as possible: Allergic reactions--skin rash, itching, hives, swelling of the face, lips, tongue, or throat High potassium level--muscle weakness, fast or irregular heartbeat Side effects that usually do  not require medical attention (report to your care team if they continue or are bothersome): Diarrhea Nausea Stomach pain Vomiting This list may not describe all possible side effects. Call your doctor for medical advice about side effects. You may report side effects to FDA at 1-800-FDA-1088. Where should I keep my medication? This medication is given in a hospital or clinic. It will not be stored at home. NOTE: This sheet is a summary. It may not cover all possible information. If you have questions about this medicine, talk to your doctor, pharmacist, or health care provider.  2024 Elsevier/Gold Standard (2022-03-16 00:00:00)   To help prevent nausea and vomiting after your treatment, we encourage you to take your nausea medication as directed.  BELOW ARE SYMPTOMS THAT SHOULD BE REPORTED IMMEDIATELY: *FEVER GREATER THAN 100.4 F (38 C) OR HIGHER *CHILLS OR SWEATING *NAUSEA AND VOMITING THAT IS NOT CONTROLLED WITH YOUR NAUSEA MEDICATION *UNUSUAL SHORTNESS OF BREATH *UNUSUAL BRUISING OR BLEEDING *URINARY PROBLEMS (pain or burning when urinating, or frequent urination) *BOWEL PROBLEMS (unusual diarrhea, constipation, pain near the anus) TENDERNESS IN MOUTH AND THROAT WITH OR WITHOUT PRESENCE OF ULCERS (sore throat, sores in mouth, or a toothache) UNUSUAL RASH, SWELLING OR PAIN  UNUSUAL VAGINAL DISCHARGE OR ITCHING   Items with * indicate a potential emergency and should be followed up as soon as possible or go to the Emergency Department if any problems should occur.  Please show the CHEMOTHERAPY ALERT CARD or IMMUNOTHERAPY ALERT CARD at check-in to the Emergency Department and triage nurse.  Should you have questions after your visit or need to cancel or reschedule your appointment, please contact [facility] - A DEPT OF [facility]  Dept: [phone]  and follow the prompts.  Office hours are 8:00 a.m. to 4:30 p.m. Monday - Friday. Please note that  voicemails left after 4:00 p.m. may not be returned until the following business day.  We are closed weekends and major holidays. You have access to a nurse at all times for urgent questions. Please call the main number to the clinic Dept: [phone] and follow the prompts.   For any non-urgent questions, you may also contact your provider using MyChart. We now offer e-Visits for anyone 26 and older to request care online for non-urgent symptoms. For details visit [email or web address].   Also download the MyChart app! Go to the app store, search "MyChart", open the app, select [facility], and log in with your MyChart username and password.

## 2024-02-21 NOTE — Progress Notes (Signed)
 Patient presents today for chemotherapy infusion of Abraxane  and Gemzar . Patient is in satisfactory condition with no new complaints voiced.  Vital signs are stable.  Labs reviewed byLisa Andy Bannister NP during the office visit and all labs are within treatment parameters with exception of potassium 2.7.  Per Diana Forster Np patient will not receive treatment today but will get x3 runs of 10 mEq potassium.

## 2024-02-21 NOTE — Progress Notes (Signed)
 Patient seen by Diana Forster NP today  Vitals are within treatment parameters:Yes   Labs are within treatment parameters: No (Please specify and give further instructions.) K+ 2.7   Treatment plan has been signed: Yes   Per physician team, Patient will not be receiving treatment today. Instead will receive KCL 30 meq IV and start oral K+ at home again

## 2024-02-21 NOTE — Progress Notes (Signed)
 CRITICAL VALUE STICKER  CRITICAL VALUE: K+ 2.7  RECEIVER (on-site recipient of call):Jasraj Lappe,RN  DATE & TIME NOTIFIED: 02/21/24 @ 0945  MESSENGER (representative from lab):Mylinda Asa  MD NOTIFIED: Diana Forster, NP  TIME OF NOTIFICATION:0946  RESPONSE: Give IV K+ 30 meq today and resume oral K+ at home.

## 2024-02-24 ENCOUNTER — Ambulatory Visit: Admitting: Internal Medicine

## 2024-02-24 ENCOUNTER — Other Ambulatory Visit: Payer: Self-pay

## 2024-02-24 ENCOUNTER — Encounter: Payer: Self-pay | Admitting: Internal Medicine

## 2024-02-24 VITALS — BP 142/81 | HR 66 | Temp 98.6°F | Ht 66.0 in | Wt 178.6 lb

## 2024-02-24 DIAGNOSIS — G911 Obstructive hydrocephalus: Secondary | ICD-10-CM

## 2024-02-24 DIAGNOSIS — I81 Portal vein thrombosis: Secondary | ICD-10-CM

## 2024-02-24 NOTE — Progress Notes (Signed)
 CC: HFU  HPI:  [name] is a 58 y.o. female living with a history stated below and presents today for a hospital follow up after being hospitalized at [facility] with confusion. Please see problem based assessment and plan for additional details.  Past Medical History:  Diagnosis Date   Abnormal stress test 01/16/2017   Brain tumor (benign) (HCC)    DJD (degenerative joint disease) of cervical spine    Galactorrhea of both breasts 2009   R. sided w/ benign papilloma excised in 4/09. L sided in 5/09   Hematuria, microscopic 02/24/2020   Hyperlipidemia    Hypertension    Hypokalemia    LOW BACK PAIN SYNDROME 07/10/2007   Qualifier: Diagnosis of   By: Kathleen Pole MD, Kathleen Stone         Nausea and vomiting 08/01/2022   Pancreatic cancer [facility])    Personal history of adenomatous colonic polyp    09/2007 - diminutive adenoma Kathleen Stone)   Stroke [facility])    2021- brain tumor bx caused ? stroke-    Tobacco abuse    Unintentional weight loss 05/26/2022    Current Outpatient Medications on File Prior to Visit  Medication Sig Dispense Refill   acetaminophen  (TYLENOL ) 325 MG tablet Take by mouth every 6 (six) hours as needed. (Patient not taking: Reported on 02/21/2024)     albuterol  (VENTOLIN  HFA) 108 (90 Base) MCG/ACT inhaler Inhale 2 puffs into the lungs every 6 (six) hours as needed for wheezing or shortness of breath. (Patient not taking: Reported on 02/21/2024) 6.7 g 1   amLODipine  (NORVASC ) 5 MG tablet Take 1 tablet (5 mg total) by mouth daily. 30 tablet 11   apixaban  (ELIQUIS ) 5 MG TABS tablet Take 1 tablet (5 mg total) by mouth 2 (two) times daily. (Patient not taking: Reported on 02/21/2024) 60 tablet 3   lidocaine -prilocaine  (EMLA ) cream Apply 1 Application topically as needed. 30 g 0   lipase/protease/amylase (CREON ) 36000 UNITS CPEP capsule Take 2 capsules (72,000 Units total) by mouth 3 (three) times daily with meals. 180 capsule 4   loperamide (IMODIUM) 2 MG capsule Take 2-4 mg by mouth as  needed for diarrhea or loose stools. Maximum 8/day     nitroGLYCERIN  (NITROSTAT ) 0.4 MG SL tablet Place 1 tablet (0.4 mg total) under the tongue every 5 (five) minutes as needed for chest pain. (Patient not taking: Reported on 02/21/2024) 25 tablet 1   ondansetron  (ZOFRAN ) 8 MG tablet Take 1 tablet (8 mg total) by mouth every 8 (eight) hours as needed. (Patient not taking: Reported on 02/21/2024) 30 tablet 1   pantoprazole  (PROTONIX ) 40 MG tablet Take 1 tablet (40 mg total) by mouth daily. 30 tablet 1   polyethylene glycol (MIRALAX / GLYCOLAX) 17 g packet Take by mouth daily. (Patient not taking: Reported on 02/21/2024)     potassium chloride  SA (KLOR-CON  M) 20 MEQ tablet Take 1 tablet (20 mEq total) by mouth as directed. Take 1 tablet (20 meq) night of 6/6; then 1 tablet (20 meq) twice daily x 3 days; then 1 tablet (20 meq) daily 33 tablet 2   senna (SENOKOT) 8.6 MG tablet Take 1 tablet by mouth daily.     No current facility-administered medications on file prior to visit.    Family History  Problem Relation Age of Onset   Heart failure Mother    Diabetes Mother    Lung cancer Father        dx 35s-80s   Breast cancer Maternal Aunt  d. 51   Pancreatic cancer Maternal Aunt        d. 64   Prostate cancer Maternal Uncle        d. 71   Pancreatic cancer Maternal Grandfather        d. 49s   Breast cancer Cousin        dx 66s; maternal female cousin   Stomach cancer Cousin        d. 33; maternal female cousin   Pancreatic cancer Cousin        d. 12s; maternal female cousin   Colon cancer Neg Hx    Colon polyps Neg Hx    Esophageal cancer Neg Hx    Rectal cancer Neg Hx     Social History   Socioeconomic History   Marital status: Legally Separated    Spouse name: Not on file   Number of children: Not on file   Years of education: Not on file   Highest education level: Not on file  Occupational History   Occupation: unemployed - applied for disability  Tobacco Use   Smoking  status: Every Day    Current packs/day: 1.50    Average packs/day: 1.5 packs/day for 36.0 years (54.0 ttl pk-yrs)    Types: Cigarettes   Smokeless tobacco: Never  Vaping Use   Vaping status: Former  Substance and Sexual Activity   Alcohol use: No    Alcohol/week: 0.0 standard drinks of alcohol   Drug use: No   Sexual activity: Yes    Partners: Female  Other Topics Concern   Not on file  Social History Narrative   Domestic Partner: female has emphysema   Smoked 2 packs/day for 20+ years, down to 1/2ppd now, willing to try to quit    Alcohol use-no   Drug use-no   Regular exercise-no   Social Drivers of Health   Financial Resource Strain: Low Risk  (02/11/2024)   Received from [facility]   Overall Financial Resource Strain (CARDIA)    Difficulty of Paying Living Expenses: Not very hard  Food Insecurity: No Food Insecurity (02/11/2024)   Received from [facility]   Hunger Vital Sign    Worried About Running Out of Food in the Last Year: Never true    Ran Out of Food in the Last Year: Never true  Transportation Needs: No Transportation Needs (02/11/2024)   Received from [facility]   PRAPARE - Transportation    Lack of Transportation (Medical): No    Lack of Transportation (Non-Medical): No  Physical Activity: Not on file  Stress: Not on file  Social Connections: Not on file  Intimate Partner Violence: Not At Risk (07/31/2022)   Humiliation, Afraid, Rape, and Kick questionnaire    Fear of Current or Ex-Partner: No    Emotionally Abused: No    Physically Abused: No    Sexually Abused: No    Review of Systems: ROS negative except for what is noted on the assessment and plan.  Vitals:   02/24/24 0907 02/24/24 0917  BP: (!) 157/72 (!) 142/81  Pulse: 66 66  Temp: 98.6 F (37 C)   TempSrc: Oral   SpO2: 97%   Weight: 178 lb 9.6 oz (81 kg)   Height: 5\' 6"  (1.676 m)     Physical Exam: Constitutional: appears well, NAD HENT: tenderness around R mastoid  region, no palpable masses/fluctuance and no erythema or warmth in region Pulmonary/Chest: normal work of breathing on room air MSK: normal  bulk and tone Neurological: alert & oriented x 4, CN II-XII intact, 5/5 strength bilateral upper and lower extremities, normal gait Skin: warm and dry Psych: normal mood and behavior   Assessment & Plan:    Patient discussed with Dr. Jarvis Mesa  Obstructive hydrocephalus [facility]) The patient is a 58 year old female with a past medical history of a known pilocytic astrocytoma, pancreatic cancer on chemotherapy, s/p SBRT 2024 and prior right stereotactic brain biopsy and right EVD in 2021, hypertension, and hyperlipidemia who presented to [facility] on 5/25 with new confusion and memory difficulty with concern for obstructive hydrocephalus.  The patient was taken to the OR on 5/28 for endoscopic third ventriculostomy.  She tolerated the procedure well and was extubated in the OR.  She was observed in the hospital and was able to be discharged the next day, after having an improvement in her symptoms, stable neuro exam, and evaluation by physical therapy with no needs identified.  She was discharged with dexamethasone , which has been tapered off, and oxycodone  PRN for pain.   Today, the patient states that she has been doing well since her discharge.  She denies any new confusion, memory difficulties, or new neurologic signs or symptoms.  She has been independent of her ADLs and was able to drive to this clinic appointment on her own.  She does endorse pain in her right mastoid region, which she has had since her surgery, but she has been taking her post-op oxycodone , with an improvement in her symptoms.  Today, the patient's neurologic exam is unremarkable -she has 5 out of 5 strength in her bilateral upper and lower extremities and CN II-XII intact. She is oriented to person, place, time, and situation.  She does have some pain in her right mastoid region, but I am  not able to appreciate any masses/fluctuance.   Plan: - Follow-up with neurosurgery tomorrow - Given phone number for neurosurgery clinic/on-call neurosurgeon today - Hold off on CT/MRI, as the patient is not having any new neurologic symptoms -she will discuss this pain she is having with her neurosurgeon tomorrow - Discuss if she is able to restart her Eliquis  with neurosurgery tomorrow (Eliquis  was intiiated 01/02/24 for high grade stenosis/occlusion of portal vein, but has been held since surgery)  Portal vein thrombosis Eliquis  therapy was initiated on 01/02/2024 for high-grade stenosis/occlusion of portal vein.  Thought to be a provoked prothrombotic event in the setting of her known malignancy and recent travel.  Her Eliquis  5 mg twice daily has been held since her surgery on 5/28.  She will discuss reinitiating Eliquis  with her neurosurgeon tomorrow at her follow up appt.    Omega Slager, D.O. [facility] Internal Medicine, PGY-3 Phone: [phone] Date 02/24/2024 Time 10:04 AM

## 2024-02-24 NOTE — Assessment & Plan Note (Addendum)
 The patient is a 58 year old female with a past medical history of a known pilocytic astrocytoma, pancreatic cancer on chemotherapy, s/p SBRT 2024 and prior right stereotactic brain biopsy and right EVD in 2021, hypertension, and hyperlipidemia who presented to [facility] on 5/25 with new confusion and memory difficulty with concern for obstructive hydrocephalus.  The patient was taken to the OR on 5/28 for endoscopic third ventriculostomy.  She tolerated the procedure well and was extubated in the OR.  She was observed in the hospital and was able to be discharged the next day, after having an improvement in her symptoms, stable neuro exam, and evaluation by physical therapy with no needs identified.  She was discharged with dexamethasone , which has been tapered off, and oxycodone  PRN for pain.   Today, the patient states that she has been doing well since her discharge.  She denies any new confusion, memory difficulties, or new neurologic signs or symptoms.  She has been independent of her ADLs and was able to drive to this clinic appointment on her own.  She does endorse pain in her right mastoid region, which she has had since her surgery, but she has been taking her post-op oxycodone , with an improvement in her symptoms.  Today, the patient's neurologic exam is unremarkable -she has 5 out of 5 strength in her bilateral upper and lower extremities and CN II-XII intact. She is oriented to person, place, time, and situation.  She does have some pain in her right mastoid region, but I am not able to appreciate any masses/fluctuance.   Plan: - Follow-up with neurosurgery tomorrow - Given phone number for neurosurgery clinic/on-call neurosurgeon today - Hold off on CT/MRI, as the patient is not having any new neurologic symptoms -she will discuss this pain she is having with her neurosurgeon tomorrow - Discuss if she is able to restart her Eliquis  with neurosurgery tomorrow (Eliquis  was intiiated  01/02/24 for high grade stenosis/occlusion of portal vein, but has been held since surgery)

## 2024-02-24 NOTE — Assessment & Plan Note (Signed)
 Eliquis  therapy was initiated on 01/02/2024 for high-grade stenosis/occlusion of portal vein.  Thought to be a provoked prothrombotic event in the setting of her known malignancy and recent travel.  Her Eliquis  5 mg twice daily has been held since her surgery on 5/28.  She will discuss reinitiating Eliquis  with her neurosurgeon tomorrow at her follow up appt.

## 2024-02-24 NOTE — Patient Instructions (Signed)
 Thank you, [name] for allowing us  to provide your care today. Today we discussed:  Hospitalization See neurosurgeon for follow up tomorrow - you are doing well now and I am glad you have not had any more confusion, or any new neurologic symptoms Make sure you discuss your right sided pain with the surgeon tomorrow. They will determine if you need to get imaging of your head (like a CT scan or MRI)  Here is the contact information for neurosurgery. If your pain worsens, or does not respond to the oxycodone , you should call them.   Neurosurgery resident on-call 24/7: Call [phone]) for the [facility] operator. Ask to speak with the "Neurosurgery resident on-call."  Neurosurgery clinic: [phone]   I have ordered the following labs for you:  Lab Orders  No laboratory test(s) ordered today       Referrals ordered today:   Referral Orders  No referral(s) requested today     I have ordered the following medication/changed the following medications:   Stop the following medications: There are no discontinued medications.   Start the following medications: No orders of the defined types were placed in this encounter.    Follow up: 3 months     Should you have any questions or concerns please call the internal medicine clinic at [phone].     Kathleen Stone, D.O. [facility]

## 2024-02-25 ENCOUNTER — Ambulatory Visit (INDEPENDENT_AMBULATORY_CARE_PROVIDER_SITE_OTHER): Admitting: Student

## 2024-02-25 ENCOUNTER — Encounter (HOSPITAL_COMMUNITY): Payer: Self-pay | Admitting: Student

## 2024-02-25 VITALS — BP 134/71 | HR 64 | Ht 66.0 in | Wt 177.2 lb

## 2024-02-25 DIAGNOSIS — F29 Unspecified psychosis not due to a substance or known physiological condition: Secondary | ICD-10-CM

## 2024-02-25 MED ORDER — MIRTAZAPINE 7.5 MG PO TABS
7.5000 mg | ORAL_TABLET | Freq: Every day | ORAL | 1 refills | Status: DC
Start: 2024-02-25 — End: 2024-04-09

## 2024-02-25 NOTE — Progress Notes (Signed)
 Psychiatric Initial Adult Assessment  Patient Identification: Kathleen Stone MRN:  [id number] Date of Evaluation:  02/25/2024 Referral Source: Therapist, Kathleen Cozart, LCSW  Assessment:  Kathleen Stone is a 58 y.o. female without prior psychiatric history but with paranoia and hallucinations, mostly tactile, over the past 1 to 2 years as well as a medical history of malignant neoplasm of the body of the pancreas and pilocytic astrocytoma who was recently hospitalized at [facility] for confusion in the setting of obstructive hydrocephalus who presents in person to [facility] for initial evaluation of paranoia.  Patient reported to therapist concerns of feeling a presence in her bed as she lies down.  She reports this to be her soulmate, Kathleen Stone.  She denies distress from this sensation.  She also endorses visual hallucinations, seeing faces in the sky, which she also finds to be comforting.  While the symptoms could be psychotic in nature, they are not causing her distress.  As well, patient has a history of pilocytic astrocytoma and has undergone brain biopsies, EVD, and EVT.  Do wonder if her pathology and procedures may have contributed to her symptoms.  As no distress is caused, do not believe that an antipsychotic is indicated at this time.  However, in light of recurrence of cancer, patient has noted difficulty sleeping as well as some suppressed appetite.  Believe that she would benefit from a trial of Remeron  at this time.  Risks, benefits, and adverse effects discussed with her, and she is agreeable to a trial.  Patient has had a trial of Remeron  in the past when she was going through chemotherapy, which she did note to stimulate her appetite.  Patient poses no safety concerns toward herself nor others at this time.  Risk Assessment: A suicide and violence risk assessment was performed as part of this evaluation. There patient is deemed to be at chronic elevated risk  for self-harm/suicide given the following factors: chronic severe medical condition. These risk factors are mitigated by the following factors: lack of active SI/HI, no known access to weapons or firearms, no history of previous suicide attempts, no history of violence, supportive family, sense of responsibility to family and social supports, presence of an available support system, expresses purpose for living, current treatment compliance, effective problem solving skills, safe housing, support system in agreement with treatment recommendations, and presence of a safety plan with follow-up care. The patient is deemed to be at chronic elevated risk for violence given the following factors: active symptoms of psychosis. These risk factors are mitigated by the following factors: no known history of violence towards others, no known history of threats of harm towards others, no command hallucinations to harm others in the last 6 months, no active symptoms of mania, low impulsivity, intolerant attitude toward deviance, high intellectual functioning, positive social orientation, religiosity, and connectedness to family. There is no acute risk for suicide or violence at this time. The patient was educated about relevant modifiable risk factors including following recommendations for treatment of psychiatric illness and abstaining from substance abuse.  While future psychiatric events cannot be accurately predicted, the patient does not currently require  acute inpatient psychiatric care and does not currently meet [place]  involuntary commitment criteria.    Plan:  # Psychosis, unspecified #Insomnia #Appetite suppression Past medication trials:  Status of problem: New to this Clinical research associate; nondistressing Interventions: -- Start Remeron  7.5 mg nightly for insomnia and appetite suppression.  - No need to address psychotic features at  this time, as it is not distressing.  May consider trial of antipsychotic in  the future if indicated.  - Management of pilocytic astrocytoma and obstructive hydrocephalus per neurosurgery and oncology   Return to care in approximately 6 to 8 weeks with Kathleen Stone, as this writer will be leaving the practice in late June.  Patient was given contact information for behavioral health clinic and was instructed to call 911 for emergencies.    Patient and plan of care will be discussed with the Attending MD ,Kathleen Stone, who agrees with the above statement and plan.   Subjective:  Chief Complaint:  Chief Complaint  Patient presents with   Establish Care   Hallucinations    History of Present Illness:  Patient reports presenting due to feeling as though something is trying to get into bed with her. Sleep is up and down. Believes it to be spiritual. She is a person of faith. She does not believe this entity to be hurtful. Denies AVH. Calls the name Kathleen Stone, and it goes away. She says her soulmate's mate is Kathleen Stone. She does not know a Kathleen Stone. Feels like a warm energy; lasts most of the night. Denies other tactile hallucinations. Still present while asleep; does not bother.   Seeing and feeling Kathleen Stone on TV and in bed, brings her comfort. Went to [facility] to look for her, but did not find her. This was last year.   Denies nightmares. Gets 3-4 hours of sleep. Feels tired throughout the day. Used to snore. Had a sleep study 4-5 years ago. Denies OSA.   Going through brain tumor, benign 3 years ago. Went to [facility] for management of hydrocephalus. Has pancreatic cancer, for the past. Has good support system, 5 siblings, daughter, and best friend.   Takes care of self in terms of ADLs. Appetite is starting to return some. Has been through chemo and radiation; plans to restart soon.   Bipolar: Denies  Psychosis: Denies AVH. Endorses paranoia,  Sees people's faces in the sky (2 years). Denies familiar faces; denies feeling scared. Believes to see God. Sometimes has TV  change to speak directly to her. Kathleen Stone is seen in commercials; as though Kathleen Stone is watching her. [employer] commercials; play a lot. Denies billboards speaking to her.   PTSD: Denies  Depression: Denies SI,   Anxiety: Worries about health. Denies trouble relaxing, difficulty falling asleep.    Tobacco- Cigarettes, 5-6 per day. Vapes a couple puffs daily Alcohol: Denies Illicit: Denies  Past Psychiatric History:  Diagnoses: Denies Medication trials: Cymbalta  Previous psychiatrist/therapist: [name], LCSW. Previously saw Amie Bald for 2-3 months.  Hospitalizations: Denies Suicide attempts: Denies SIB: Denies Hx of violence towards others: Denies Current access to guns: Yes, locked.  Hx of trauma/abuse: Denies Head trauma/Seizures: Denies  Substance Abuse History in the last 12 months:  No.  Past Medical History:  Past Medical History:  Diagnosis Date   Abnormal stress test 01/16/2017   Brain tumor (benign) (HCC)    DJD (degenerative joint disease) of cervical spine    Galactorrhea of both breasts 2009   R. sided w/ benign papilloma excised in 4/09. L sided in 5/09   Hematuria, microscopic 02/24/2020   Hyperlipidemia    Hypertension    Hypokalemia    LOW BACK PAIN SYNDROME 07/10/2007   Qualifier: Diagnosis of   By: Latrelle Pole MD, Josefina Nian         Nausea and vomiting 08/01/2022   Pancreatic cancer [facility])    Personal history  of adenomatous colonic polyp    09/2007 - diminutive adenoma Willy Harvest)   Stroke [facility])    2021- brain tumor bx caused ? stroke-    Tobacco abuse    Unintentional weight loss 05/26/2022    Past Surgical History:  Procedure Laterality Date   APPENDECTOMY     BIOPSY  08/02/2022   Procedure: BIOPSY;  Surgeon: Normie Becton., MD;  Location: [facility];  Service: Gastroenterology;;   BIOPSY  04/17/2023   Procedure: BIOPSY;  Surgeon: Normie Becton., MD;  Location: [facility];  Service: Gastroenterology;;   BRAIN BIOPSY     [phone]    BREAST SURGERY Right 12/2007   central duct excision   CHOLECYSTECTOMY N/A 12/08/2019   Procedure: LAPAROSCOPIC CHOLECYSTECTOMY;  Surgeon: Shela Derby, MD;  Location: [facility] OR;  Service: General;  Laterality: N/A;   COLONOSCOPY  2009   COLONOSCOPY  09/14/2020   COLONOSCOPY W/ BIOPSIES AND POLYPECTOMY  09/2007   Dr. Willy Harvest. 2 small adenomatous polyps, internal hemorrhoids-Grade 1. Rec  routine colonoscopy at age 76.   COLONOSCOPY W/ BIOPSIES AND POLYPECTOMY     ESOPHAGOGASTRODUODENOSCOPY (EGD) WITH PROPOFOL  N/A 08/02/2022   Procedure: ESOPHAGOGASTRODUODENOSCOPY (EGD) WITH PROPOFOL ;  Surgeon: Brice Campi Albino Alu., MD;  Location: [facility];  Service: Gastroenterology;  Laterality: N/A;   ESOPHAGOGASTRODUODENOSCOPY (EGD) WITH PROPOFOL  N/A 04/17/2023   Procedure: ESOPHAGOGASTRODUODENOSCOPY (EGD) WITH PROPOFOL ;  Surgeon: Brice Campi Albino Alu., MD;  Location: [facility];  Service: Gastroenterology;  Laterality: N/A;   EUS N/A 08/02/2022   Procedure: FULL UPPER ENDOSCOPIC ULTRASOUND (EUS) RADIAL;  Surgeon: Brice Campi Albino Alu., MD;  Location: [facility];  Service: Gastroenterology;  Laterality: N/A;   EUS N/A 04/17/2023   Procedure: UPPER ENDOSCOPIC ULTRASOUND (EUS) RADIAL;  Surgeon: Normie Becton., MD;  Location: [facility];  Service: Gastroenterology;  Laterality: N/A;   FIDUCIAL MARKER PLACEMENT N/A 04/17/2023   Procedure: FIDUCIAL MARKER PLACEMENT;  Surgeon: Normie Becton., MD;  Location: [facility];  Service: Gastroenterology;  Laterality: N/A;   FINE NEEDLE ASPIRATION N/A 08/02/2022   Procedure: FINE NEEDLE ASPIRATION (FNA) LINEAR;  Surgeon: Normie Becton., MD;  Location: [facility];  Service: Gastroenterology;  Laterality: N/A;   IR IMAGING GUIDED PORT INSERTION  08/03/2022   NASAL SEPTOPLASTY W/ TURBINOPLASTY Bilateral 04/2008   Septoplasty with bilateral inferior turbinate reduction; Dr. Odean Bend   POLYPECTOMY  08/02/2022   Procedure:  POLYPECTOMY;  Surgeon: Normie Becton., MD;  Location: [facility];  Service: Gastroenterology;;   SHOULDER OPEN ROTATOR CUFF REPAIR Right     after hurting it by moving heavy objecs    Family Psychiatric History: Denies  Denies SA.[place]  Denies substance use  Family History:  Family History  Problem Relation Age of Onset   Heart failure Mother    Diabetes Mother    Lung cancer Father        dx 68s-80s   Breast cancer Maternal Aunt        d. 97   Pancreatic cancer Maternal Aunt        d. 64   Prostate cancer Maternal Uncle        d. 31   Pancreatic cancer Maternal Grandfather        d. 37s   Breast cancer Cousin        dx 31s; maternal female cousin   Stomach cancer Cousin        d. 45; maternal female cousin   Pancreatic cancer Cousin        d. 41s; maternal  female cousin   Colon cancer Neg Hx    Colon polyps Neg Hx    Esophageal cancer Neg Hx    Rectal cancer Neg Hx     Social History:   Academic/Vocational: Split from wife 2 years ago; divorce not finalized, and she is unsure as to the reason.  Social History   Socioeconomic History   Marital status: Legally Separated    Spouse name: Not on file   Number of children: Not on file   Years of education: Not on file   Highest education level: Not on file  Occupational History   Occupation: unemployed - applied for disability  Tobacco Use   Smoking status: Every Day    Current packs/day: 1.50    Average packs/day: 1.5 packs/day for 36.0 years (54.0 ttl pk-yrs)    Types: Cigarettes   Smokeless tobacco: Never  Vaping Use   Vaping status: Former  Substance and Sexual Activity   Alcohol use: No    Alcohol/week: 0.0 standard drinks of alcohol   Drug use: No   Sexual activity: Yes    Partners: Female  Other Topics Concern   Not on file  Social History Narrative   Domestic Partner: female has emphysema   Smoked 2 packs/day for 20+ years, down to 1/2ppd now, willing to try to quit    Alcohol use-no   Drug  use-no   Regular exercise-no   Social Drivers of Health   Financial Resource Strain: Low Risk  (02/11/2024)   Received from [facility]   Overall Financial Resource Strain (CARDIA)    Difficulty of Paying Living Expenses: Not very hard  Food Insecurity: No Food Insecurity (02/11/2024)   Received from [facility]   Hunger Vital Sign    Within the past 12 months, you worried that your food would run out before you got the money to buy more.: Never true    Within the past 12 months, the food you bought just didn't last and you didn't have money to get more.: Never true  Transportation Needs: No Transportation Needs (02/11/2024)   [facility] from [facility]   PRAPARE - Transportation    Lack of Transportation (Medical): No    Lack of Transportation (Non-Medical): No  Physical Activity: Not on file  Stress: Not on file  Social Connections: Not on file    Additional Social History: updated  Allergies:   Allergies  Allergen Reactions   Iohexol  Hives, Itching and Other (See Comments)    Benadryl  50mg  PO one hour before iodinated contrast studies.  Jkl, per pt she only has itching when she urinates, not with injection 08/01/22 [facility]    Current Medications: Current Outpatient Medications  Medication Sig Dispense Refill   amLODipine  (NORVASC ) 5 MG tablet Take 1 tablet (5 mg total) by mouth daily. 30 tablet 11   lidocaine -prilocaine  (EMLA ) cream Apply 1 Application topically as needed. 30 g 0   lipase/protease/amylase (CREON ) 36000 UNITS CPEP capsule Take 2 capsules (72,000 Units total) by mouth 3 (three) times daily with meals. 180 capsule 4   loperamide (IMODIUM) 2 MG capsule Take 2-4 mg by mouth as needed for diarrhea or loose stools. Maximum 8/day     mirtazapine  (REMERON ) 7.5 MG tablet Take 1 tablet (7.5 mg total) by mouth at bedtime. 30 tablet 1   pantoprazole  (PROTONIX ) 40 MG tablet Take 1 tablet (40 mg total) by mouth daily. 30 tablet 1   potassium chloride  SA (KLOR-CON  M)  20 MEQ tablet  Take 1 tablet (20 mEq total) by mouth as directed. Take 1 tablet (20 meq) night of 6/6; then 1 tablet (20 meq) twice daily x 3 days; then 1 tablet (20 meq) daily 33 tablet 2   senna (SENOKOT) 8.6 MG tablet Take 1 tablet by mouth daily.     acetaminophen  (TYLENOL ) 325 MG tablet Take by mouth every 6 (six) hours as needed. (Patient not taking: Reported on 02/21/2024)     albuterol  (VENTOLIN  HFA) 108 (90 Base) MCG/ACT inhaler Inhale 2 puffs into the lungs every 6 (six) hours as needed for wheezing or shortness of breath. (Patient not taking: Reported on 02/21/2024) 6.7 g 1   apixaban  (ELIQUIS ) 5 MG TABS tablet Take 1 tablet (5 mg total) by mouth 2 (two) times daily. (Patient not taking: Reported on 02/21/2024) 60 tablet 3   nitroGLYCERIN  (NITROSTAT ) 0.4 MG SL tablet Place 1 tablet (0.4 mg total) under the tongue every 5 (five) minutes as needed for chest pain. (Patient not taking: Reported on 02/21/2024) 25 tablet 1   ondansetron  (ZOFRAN ) 8 MG tablet Take 1 tablet (8 mg total) by mouth every 8 (eight) hours as needed. (Patient not taking: Reported on 02/21/2024) 30 tablet 1   polyethylene glycol (MIRALAX / GLYCOLAX) 17 g packet Take by mouth daily. (Patient not taking: Reported on 02/21/2024)     No current facility-administered medications for this visit.    ROS: Review of Systems  Objective:  Psychiatric Specialty Exam: Blood pressure 134/71, pulse 64, height 5' 6 (1.676 m), weight 177 lb 3.2 oz (80.4 kg), last menstrual period 02/07/2016, SpO2 100%.Body mass index is 28.6 kg/m.  General Appearance: Casual and Well Groomed  Eye Contact:  Good  Speech:  Clear and Coherent and Normal Rate  Volume:  Normal  Mood:  Euthymic  Affect:  Appropriate and Congruent  Thought Content: Delusions, Hallucinations: Tactile Visual, and Ideas of Reference:   Paranoia Delusions   Suicidal Thoughts:  No  Homicidal Thoughts:  No  Thought Process:  Coherent and Goal Directed  Orientation:  Full (Time,  Place, and Person)    Memory: Immediate;   Good Recent;   Good Remote;   Good  Judgment:  Fair  Insight:  Fair and Shallow  Concentration:  Concentration: Good and Attention Span: Good  Recall:  not formally assessed  Fund of Knowledge: Good  Language: Good  Psychomotor Activity:  Normal  Akathisia:  No  AIMS (if indicated): not done  Assets:  Architect Housing Leisure Time Resilience Social Support Transportation  ADL's:  Intact  Cognition: WNL  Sleep:  Fair   PE: General: well-appearing; no acute distress Pulm: no increased work of breathing on room air  Strength & Muscle Tone: within normal limits Neuro: no focal neurological deficits observed Gait & Station: normal  Metabolic Disorder Labs: Lab Results  Component Value Date   HGBA1C 4.9 09/09/2023   MPG 93.93 09/09/2023   MPG 119.76 11/16/2021   Lab Results  Component Value Date   PROLACTIN 7.7 10/21/2007   Lab Results  Component Value Date   CHOL 174 11/16/2021   TRIG 149 11/16/2021   HDL 38 (L) 11/16/2021   CHOLHDL 4.6 11/16/2021   VLDL 30 11/16/2021   LDLCALC 106 (H) 11/16/2021   LDLCALC 124 (H) 06/13/2020   Lab Results  Component Value Date   TSH 0.552 05/02/2022    Therapeutic Level Labs: No results found for: LITHIUM No results found for: CBMZ No results found for: VALPROATE  Screenings:  GAD-7    Advertising copywriter from 10/28/2023 in [facility] Office Visit from 11/01/2021 in [facility] - A Dept Of [place]. [facility]  Total GAD-7 Score 21 19   PHQ2-9    Flowsheet Row Office Visit from 02/25/2024 in [facility] Office Visit from 02/24/2024 in [facility] - A Dept Of [place]. [facility] Office Visit from 01/23/2024 in [facility] - A Dept Of [place]. [facility] Care Coordination from 01/09/2024 in  [place] [facility]PARTMENT Office Visit from 12/12/2023 in [facility] - A Dept Of [place]. [facility]  PHQ-2 Total Score 1 0 0 1 0   Flowsheet Row ED from 02/08/2024 in [facility] Emergency Department at [facility] ED from 02/03/2024 in [facility] Emergency Department at [facility] ED from 01/06/2024 in [facility] Emergency Department at [facility]  C-SSRS RISK CATEGORY No Risk No Risk No Risk    Collaboration of Care: Collaboration of Care: Kathleen Stone  Patient/Guardian was advised Release of Information must be obtained prior to any record release in order to collaborate their care with an outside provider. Patient/Guardian was advised if they have not already done so to contact the registration department to sign all necessary forms in order for us  to release information regarding their care.   Consent: Patient/Guardian gives verbal consent for treatment and assignment of benefits for services provided during this visit. Patient/Guardian expressed understanding and agreed to proceed.   Shery Done, MD 02/25/2024 8:10 AM

## 2024-02-25 NOTE — Progress Notes (Signed)
 Internal Medicine Clinic Attending  Case discussed with the resident at the time of the visit.  We reviewed the resident's history and exam and pertinent patient test results.  I agree with the assessment, diagnosis, and plan of care documented in the resident's note.

## 2024-02-26 ENCOUNTER — Ambulatory Visit

## 2024-02-26 ENCOUNTER — Other Ambulatory Visit: Payer: Self-pay

## 2024-02-26 ENCOUNTER — Other Ambulatory Visit

## 2024-02-26 ENCOUNTER — Ambulatory Visit: Admitting: Nurse Practitioner

## 2024-02-28 DIAGNOSIS — F29 Unspecified psychosis not due to a substance or known physiological condition: Secondary | ICD-10-CM | POA: Insufficient documentation

## 2024-03-02 ENCOUNTER — Ambulatory Visit (HOSPITAL_COMMUNITY): Admitting: Clinical

## 2024-03-02 DIAGNOSIS — F29 Unspecified psychosis not due to a substance or known physiological condition: Secondary | ICD-10-CM | POA: Diagnosis not present

## 2024-03-02 NOTE — Progress Notes (Signed)
   THERAPIST PROGRESS NOTE  Session Time: 25 minutes  Participation Level: Active  Behavioral Response: CasualAlertEuthymic  Type of Therapy: Individual Therapy  Treatment Goals addressed:  Gracilyn will cooperate with a psychiatric evaluation on 02/25/24 and during all follow-up visits   ProgressTowards Goals: Progressing  Interventions: CBT and Supportive  Summary:  ZANNA HAWN is a 58 y.o. female who presents for the scheduled appointment oriented times five, appropriately dressed and friendly. Client denied hallucination and delusions. Client reported today she is doing fairly okay.  Client reported since she was last seen she had a biopsy done with the doctor on her brain.  Client reported her doctors do not want her to start chemotherapy for the cancer in her pancreas until they get the results back from her biopsy.  Client reported she is still awaiting a follow-up phone call.  Client reported her energy is picking back up somewhat.  Client reported her friend was discussing making a trip but they do not want to take her too far away from home in case she has something happen regarding her health.  Client reported she will go to the beach with her best friend and her grandson soon.  Client reported she started the Remeron  medication from the psychiatrist last week so she is not yet sure what all effective will have.  Client reported overall she does not have any anxiety or depressive symptoms.  Client reported she does see the face about when she looks up in the sky and does still have the occurrence of feeling like someone is in the bed with her.  Client reported she does not find the hallucinations to be distressing at all.  Client stated I just do not care in a way of excepting how things are and is taking each day at a time.  Client reported her sister will probably cookout for the Juneteenth holiday. Evidence of progress towards goal: Client reported 0 anxiety and/or depressive  symptoms.  Client reported she is medication compliant 7 days a week.  Suicidal/Homicidal: Nowithout intent/plan  Therapist Response:  Therapist began the appointment asking the client how she has been doing since last seen. Therapist engaged with active listening and positive emotional support. Therapist used CBT to ask the client how she is coping with her health appointments. Therapist used CBT to ask client open-ended questions about her mindfulness of AVH symptoms as well as her tolerance. Therapist used CBT to positively reinforce the clients use of acceptance and optimistic mindset. Therapist used CBT to continue to reinforce self-care and engaging in positive activities. Therapist used CBT ask the client to identify her progress with frequency of use with coping skills with continued practice in her daily activity.    Therapist assigned client homework to practice self-care.  Plan: Return again in 4 weeks.  Diagnosis: Psychosis, unspecified psychosis type  Collaboration of Care: Patient refused AEB none requested by the client.  Patient/Guardian was advised Release of Information must be obtained prior to any record release in order to collaborate their care with an outside provider. Patient/Guardian was advised if they have not already done so to contact the registration department to sign all necessary forms in order for us  to release information regarding their care.   Consent: Patient/Guardian gives verbal consent for treatment and assignment of benefits for services provided during this visit. Patient/Guardian expressed understanding and agreed to proceed.   Key Cen Y Zaden Sako, LCSW 03/02/2024

## 2024-03-03 ENCOUNTER — Other Ambulatory Visit: Payer: Self-pay

## 2024-03-04 ENCOUNTER — Encounter: Payer: Self-pay | Admitting: *Deleted

## 2024-03-04 ENCOUNTER — Other Ambulatory Visit: Payer: Self-pay

## 2024-03-04 NOTE — Patient Outreach (Signed)
 Complex Care Management   Visit Note  03/04/2024  Name:  Kathleen Stone MRN: [id number] DOB: [birth year]  Situation: Referral received for Complex Care Management related to Pancreatic Mass I obtained verbal consent from Patient.  Visit completed with patient  on the phone  Background:   Past Medical History:  Diagnosis Date   Abnormal stress test 01/16/2017   Brain tumor (benign) (HCC)    DJD (degenerative joint disease) of cervical spine    Galactorrhea of both breasts 2009   R. sided w/ benign papilloma excised in 4/09. L sided in 5/09   Hematuria, microscopic 02/24/2020   Hyperlipidemia    Hypertension    Hypokalemia    LOW BACK PAIN SYNDROME 07/10/2007   Qualifier: Diagnosis of   By: Latrelle Pole MD, Josefina Nian         Nausea and vomiting 08/01/2022   Pancreatic cancer [facility])    Personal history of adenomatous colonic polyp    09/2007 - diminutive adenoma [facility])   Stroke [facility])    2021- brain tumor bx caused ? stroke-    Tobacco abuse    Unintentional weight loss 05/26/2022    Assessment: Patient Reported Symptoms:  Cognitive Cognitive Status: Able to follow simple commands, Alert and oriented to person, place, and time, Normal speech and language skills      Neurological Neurological Review of Symptoms: No symptoms reported    HEENT HEENT Symptoms Reported: No symptoms reported      Cardiovascular Cardiovascular Symptoms Reported: No symptoms reported    Respiratory Respiratory Symptoms Reported: No symptoms reported    Endocrine Patient reports the following symptoms related to hypoglycemia or hyperglycemia : No symptoms reported Is patient diabetic?: No    Gastrointestinal Gastrointestinal Symptoms Reported: Abdominal pain or discomfort Additional Gastrointestinal Details: She only has abdominal pain once in a blue moon per the patient if she goes to the bathrrom it will stop Gastrointestinal Conditions: Abdominal pain Gastrointestinal Management Strategies:  Medication therapy, Diet modification    Genitourinary Genitourinary Symptoms Reported: No symptoms reported    Integumentary Integumentary Symptoms Reported: Incision Additional Integumentary Details: incision from head surgery and it is healing well. Skin Management Strategies: Medication therapy  Musculoskeletal Musculoskelatal Symptoms Reviewed: No symptoms reported        Psychosocial       Quality of Family Relationships: helpful, involved, supportive Do you feel physically threatened by others?: No      03/04/2024   12:22 PM  Depression screen PHQ 2/9  Decreased Interest 0  Down, Depressed, Hopeless 0  PHQ - 2 Score 0    There were no vitals filed for this visit.  Medications Reviewed Today   Medications were not reviewed in this encounter     Recommendation:   PCP Follow-up  Follow Up Plan:   Telephone follow up appointment with care management team member scheduled for:  04/02/24  9 am  Augustin Leber RN, BSN, [facility]  [facility], [facility]   Care Coordinator Phone: [phone]

## 2024-03-04 NOTE — Patient Instructions (Signed)
 Visit Information  Thank you for taking time to visit with me today. Please don't hesitate to contact me if I can be of assistance to you before our next scheduled appointment.  Your next care management appointment is a Telephone follow up appointment with care management team member scheduled for:  04/02/24  9 am  Please call the care guide team at [phone] if you need to cancel, schedule, or reschedule an appointment.   Please call 1-800-273-TALK (toll free, 24 hour hotline) call 911 if you are experiencing a Mental Health or Behavioral Health Crisis or need someone to talk to.  Augustin Leber RN, BSN, [facility]  [facility], [facility]   Care Coordinator Phone: [phone]

## 2024-03-13 ENCOUNTER — Other Ambulatory Visit (HOSPITAL_COMMUNITY): Payer: Self-pay | Admitting: Neurosurgery

## 2024-03-13 ENCOUNTER — Ambulatory Visit
Admission: RE | Admit: 2024-03-13 | Discharge: 2024-03-13 | Disposition: A | Discharge status: Home / Self Care | Source: Ambulatory Visit | Attending: Neurosurgery | Admitting: Neurosurgery

## 2024-03-13 DIAGNOSIS — G911 Obstructive hydrocephalus: Secondary | ICD-10-CM

## 2024-03-16 ENCOUNTER — Telehealth: Payer: Self-pay | Admitting: *Deleted

## 2024-03-16 ENCOUNTER — Other Ambulatory Visit: Payer: Self-pay | Admitting: Nurse Practitioner

## 2024-03-16 NOTE — Telephone Encounter (Addendum)
 Called to inquire when she will be seen again by Dr. Cloretta to go over the CT brain she had on 03/13/24. Also, will she be due a chemotherapy treatment as well?  Per Olam Ned, will see her on 7/2 at 0815 with lab/flush prior followed by chemotherapy. High priority scheduling message sent and patient notified she will be called tomorrow.

## 2024-03-17 ENCOUNTER — Encounter: Payer: Self-pay | Admitting: Oncology

## 2024-03-17 ENCOUNTER — Telehealth: Payer: Self-pay | Admitting: Nurse Practitioner

## 2024-03-17 NOTE — Telephone Encounter (Signed)
 Per secure chat, PT is scheduled and appt confirmed.

## 2024-03-18 ENCOUNTER — Inpatient Hospital Stay (HOSPITAL_BASED_OUTPATIENT_CLINIC_OR_DEPARTMENT_OTHER): Admitting: Nurse Practitioner

## 2024-03-18 ENCOUNTER — Inpatient Hospital Stay

## 2024-03-18 ENCOUNTER — Inpatient Hospital Stay: Attending: Oncology

## 2024-03-18 VITALS — BP 126/79 | HR 85 | Temp 98.1°F | Resp 18

## 2024-03-18 VITALS — BP 142/80 | HR 85 | Temp 97.8°F | Resp 18 | Ht 66.0 in | Wt 170.8 lb

## 2024-03-18 DIAGNOSIS — Z860100 Personal history of colon polyps, unspecified: Secondary | ICD-10-CM | POA: Insufficient documentation

## 2024-03-18 DIAGNOSIS — R112 Nausea with vomiting, unspecified: Secondary | ICD-10-CM | POA: Insufficient documentation

## 2024-03-18 DIAGNOSIS — E876 Hypokalemia: Secondary | ICD-10-CM | POA: Insufficient documentation

## 2024-03-18 DIAGNOSIS — I10 Essential (primary) hypertension: Secondary | ICD-10-CM | POA: Diagnosis not present

## 2024-03-18 DIAGNOSIS — Z7901 Long term (current) use of anticoagulants: Secondary | ICD-10-CM | POA: Insufficient documentation

## 2024-03-18 DIAGNOSIS — Z95828 Presence of other vascular implants and grafts: Secondary | ICD-10-CM

## 2024-03-18 DIAGNOSIS — Z801 Family history of malignant neoplasm of trachea, bronchus and lung: Secondary | ICD-10-CM | POA: Diagnosis not present

## 2024-03-18 DIAGNOSIS — Z85841 Personal history of malignant neoplasm of brain: Secondary | ICD-10-CM | POA: Diagnosis not present

## 2024-03-18 DIAGNOSIS — R109 Unspecified abdominal pain: Secondary | ICD-10-CM | POA: Insufficient documentation

## 2024-03-18 DIAGNOSIS — I81 Portal vein thrombosis: Secondary | ICD-10-CM | POA: Diagnosis not present

## 2024-03-18 DIAGNOSIS — R634 Abnormal weight loss: Secondary | ICD-10-CM | POA: Insufficient documentation

## 2024-03-18 DIAGNOSIS — N950 Postmenopausal bleeding: Secondary | ICD-10-CM | POA: Insufficient documentation

## 2024-03-18 DIAGNOSIS — R197 Diarrhea, unspecified: Secondary | ICD-10-CM | POA: Insufficient documentation

## 2024-03-18 DIAGNOSIS — C251 Malignant neoplasm of body of pancreas: Secondary | ICD-10-CM | POA: Insufficient documentation

## 2024-03-18 DIAGNOSIS — Z5111 Encounter for antineoplastic chemotherapy: Secondary | ICD-10-CM | POA: Insufficient documentation

## 2024-03-18 DIAGNOSIS — R630 Anorexia: Secondary | ICD-10-CM | POA: Insufficient documentation

## 2024-03-18 DIAGNOSIS — Z803 Family history of malignant neoplasm of breast: Secondary | ICD-10-CM | POA: Insufficient documentation

## 2024-03-18 LAB — CBC WITH DIFFERENTIAL (CANCER CENTER ONLY)
Abs Immature Granulocytes: 0.04 10*3/uL (ref 0.00–0.07)
Basophils Absolute: 0 10*3/uL (ref 0.0–0.1)
Basophils Relative: 0 %
Eosinophils Absolute: 0.1 10*3/uL (ref 0.0–0.5)
Eosinophils Relative: 1 %
HCT: 41.3 % (ref 36.0–46.0)
Hemoglobin: 13.5 g/dL (ref 12.0–15.0)
Immature Granulocytes: 0 %
Lymphocytes Relative: 22 %
Lymphs Abs: 2.2 10*3/uL (ref 0.7–4.0)
MCH: 29.5 pg (ref 26.0–34.0)
MCHC: 32.7 g/dL (ref 30.0–36.0)
MCV: 90.2 fL (ref 80.0–100.0)
Monocytes Absolute: 0.7 10*3/uL (ref 0.1–1.0)
Monocytes Relative: 7 %
Neutro Abs: 7 10*3/uL (ref 1.7–7.7)
Neutrophils Relative %: 70 %
Platelet Count: 155 10*3/uL (ref 150–400)
RBC: 4.58 MIL/uL (ref 3.87–5.11)
RDW: 16.1 % — ABNORMAL HIGH (ref 11.5–15.5)
WBC Count: 10 10*3/uL (ref 4.0–10.5)
nRBC: 0 % (ref 0.0–0.2)

## 2024-03-18 LAB — CMP (CANCER CENTER ONLY)
ALT: 8 U/L (ref 0–44)
AST: 19 U/L (ref 15–41)
Albumin: 3.8 g/dL (ref 3.5–5.0)
Alkaline Phosphatase: 111 U/L (ref 38–126)
Anion gap: 10 (ref 5–15)
BUN: 9 mg/dL (ref 6–20)
CO2: 27 mmol/L (ref 22–32)
Calcium: 9.9 mg/dL (ref 8.9–10.3)
Chloride: 107 mmol/L (ref 98–111)
Creatinine: 0.75 mg/dL (ref 0.44–1.00)
GFR, Estimated: >60 mL/min (ref >60)
Glucose, Bld: 109 mg/dL — ABNORMAL HIGH (ref 70–99)
Potassium: 3.2 mmol/L — ABNORMAL LOW (ref 3.5–5.1)
Sodium: 143 mmol/L (ref 135–145)
Total Bilirubin: 0.8 mg/dL (ref 0.0–1.2)
Total Protein: 7.1 g/dL (ref 6.5–8.1)

## 2024-03-18 MED ORDER — HEPARIN SOD (PORK) LOCK FLUSH 100 UNIT/ML IV SOLN
500.0000 [IU] | Freq: Once | INTRAVENOUS | Status: AC | PRN
Start: 2024-03-18 — End: 2024-03-18
  Administered 2024-03-18: 500 [IU]

## 2024-03-18 MED ORDER — PACLITAXEL PROTEIN-BOUND CHEMO INJECTION 100 MG
100.0000 mg/m2 | Freq: Once | INTRAVENOUS | Status: AC
  Administered 2024-03-18: 200 mg via INTRAVENOUS
  Filled 2024-03-18: qty 40

## 2024-03-18 MED ORDER — SODIUM CHLORIDE 0.9% FLUSH
10.0000 mL | INTRAVENOUS | Status: DC | PRN
  Administered 2024-03-18: 10 mL

## 2024-03-18 MED ORDER — SODIUM CHLORIDE 0.9 % IV SOLN
1000.0000 mg/m2 | Freq: Once | INTRAVENOUS | Status: AC
  Administered 2024-03-18: 1900 mg via INTRAVENOUS
  Filled 2024-03-18: qty 49.97

## 2024-03-18 MED ORDER — PROCHLORPERAZINE MALEATE 10 MG PO TABS
10.0000 mg | ORAL_TABLET | Freq: Once | ORAL | Status: AC
  Administered 2024-03-18: 10 mg via ORAL
  Filled 2024-03-18: qty 1

## 2024-03-18 MED ORDER — SODIUM CHLORIDE 0.9 % IV SOLN: Freq: Once | INTRAVENOUS | Status: AC

## 2024-03-18 MED ORDER — SODIUM CHLORIDE 0.9% FLUSH
10.0000 mL | Freq: Once | INTRAVENOUS | Status: AC
  Administered 2024-03-18: 10 mL

## 2024-03-18 NOTE — Progress Notes (Signed)
 [facility] OFFICE PROGRESS NOTE   Diagnosis: Pancreas cancer  INTERVAL HISTORY:   Kathleen Stone returns for follow-up.  She completed a cycle of gemcitabine /Abraxane  02/07/2024.  Treatment was held 02/21/2024 pending clearance from neurosurgery following an endoscopic third ventriculostomy done on 02/12/2024.  She did not return for scheduled follow-up.  Brain CT 03/13/2024 showed interval resolution of obstructive hydrocephalus and transependymal edema and mild interval decrease in size of a mass within the right midbrain.  She report blurry vision over the past 2 weeks, getting worse.  No nausea or vomiting.  No diarrhea.  No balance issues.  She had a single episode of dizziness last week.  Appetite is poor.  She is losing weight.  She continues to have abdominal pain.  Objective:  Vital signs in last 24 hours:  Blood pressure (!) 142/80, pulse 85, temperature 97.8 F (36.6 C), temperature source Temporal, resp. rate 18, height 5' 6 (1.676 m), weight 170 lb 12.8 oz (77.5 kg), last menstrual period 02/07/2016, SpO2 100%.    HEENT: No thrush or ulcers. Resp: Lungs clear bilaterally. Cardio: Regular rate and rhythm. GI: Abdomen is soft, tender at the left upper abdomen.  No hepatosplenomegaly.  No mass. Vascular: No leg edema. Neuro: Alert and oriented. Skin: No rash. Port-A-Cath without erythema.  Lab Results:  Lab Results  Component Value Date   WBC 10.0 03/18/2024   HGB 13.5 03/18/2024   HCT 41.3 03/18/2024   MCV 90.2 03/18/2024   PLT 155 03/18/2024   NEUTROABS 7.0 03/18/2024    Imaging:  No results found.  Medications: I have reviewed the patient's current medications.  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 CT abdomen/pelvis 07/30/2022-body/neck pancreas mass with peripheral pancreatic atrophy and duct dilation, suspected encasement of the celiac axis, uterine fibroids MRI abdomen 07/30/2022-3.7 x 4.7 cm pancreas body/tail mass with encasement of the  proximal celiac artery, occlusion with collateralization at the confluence of the SMV and portal vein, no evidence of metastatic disease CT chest 08/01/2022-no intrathoracic metastases Elevated CA 19-9 EUS 08/02/2022-gastric polyp, gastric erosions, 32 x 33 mm pancreas body mass, invasion of the celiac trunk, invasion of the splenoportal confluence, no SMA invasion, 2 malignant appearing nodes in the porta hepatis,T4N1 by endoscopic criteria, FNA biopsy-adenocarcinoma 08/17/2022 [employer]-tumor mutation burden 0.96; MSI high not detected; 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  Stable porta hepatis lymph nodes. Cycle 7 gemcitabine /Abraxane  11/09/2022 Cycle 8 gemcitabine /Abraxane  11/23/2022 Cycle 9 gemcitabine /Abraxane  12/07/2022 Cycle 10 gemcitabine /Abraxane  12/21/2022 Cycle 11 gemcitabine /Abraxane  01/04/2023 Cycle 12 gemcitabine /Abraxane  01/18/2023 Cycle 13 gemcitabine /Abraxane  02/01/2023 Cycle 14 gemcitabine /Abraxane  02/15/2023 CT abdomen/pelvis 02/26/2023-stable appearance of pancreas body mass with vascular encasement and chronic occlusion of the splenic vein, no evidence of metastatic disease Cycle 15 gemcitabine /Abraxane  03/01/2023 Cycle 16 gemcitabine /Abraxane  03/15/2023 Cycle 17 Gemcitabine /Abraxane  03/29/2023 EUS pancreas fiducial marker placement 04/17/2023 Pancreas SBRT 05/07/2023 - 05/17/2023, 33 Gray in 5 fractions CT abdomen/pelvis 09/14/2023: Slight decrease in pancreas body mass with persistent vascular encasement, no evidence of metastatic disease CTs [facility]  01/02/2024-ill-defined hypoattenuating mass in the mid pancreatic body surrounding surgical clips.  Pancreatic duct within the distal pancreatic body and tail is dilated.  Mass encases the celiac trunk.  Common bile duct is dilated with suggestion of focal  stenosis in the mid bile duct.  High-grade stenosis/occlusion of the portal vein.  Prominent vessels in the upper abdomen.  Heterogeneous appearance of the splenic parenchyma with suggestion of an infiltrating hypoattenuating mass/masses versus sequela of splenic infarcts.  Multiple pulmonary nodules in the lung bases concerning for metastatic disease. CTs chest/abdomen/pelvis 01/06/2024-no PE.  Cardiomegaly with small pericardial effusion.  Small irregular focus of density medial right lung base.  Poorly defined pancreatic body mass measuring 3 x 2.2 cm, previously 3 x 2.5 cm, possible extension of infiltrative soft tissue towards the pancreatic neck region with ill-defined density at the porta hepatis compared to prior.  Slight increased extrahepatic biliary and common duct dilatation compared to prior.  No focal splenic abnormality. Cycle 1 gemcitabine /abraxane  02/07/2024 Cycle 2 gemcitabine /Abraxane  03/18/2024     2.  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  Similar size of of enhancing mass in the dorsal right midbrain effacing the cerebral aqueduct.  No new enhancing lesions identified.  She was transferred to [facility].  She underwent an endoscopic third ventriculostomy on 02/12/2024.   Brain CT 03/13/2024-interval resolution of obstructive hydrocephalus and transependymal edema.  Mild interval decrease in size of a mass within the right midbrain, compatible with glioma.  Chronic encephalomalacia changes in the right frontal lobe. 02/12/2024: [facility] at [facility]   3.  Anorexia/weight loss secondary to  #1 4.  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  Abdominal pain 6.  History of colon polyps 7.  Family history of multiple cancers Pancreas cancer maternal aunt, maternal cousin, maternal grandfather Breast cancer-maternal aunt, maternal cousin Prostate cancer-maternal uncle Lung cancer-father 8.  Hypertension 9.  Postmenopausal vaginal bleeding 10.  H. pylori 08/02/2022-amoxicillin  and Flagyl  discontinued 08/21/2022 due to GI symptoms 11.  Syncope event 03/07/2023 12.  Eliquis  initiated 01/02/2024 for high-grade stenosis/occlusion of the portal vein    Disposition: Kathleen Stone appears stable.  She completed 1 cycle of gemcitabine /Abraxane  02/07/2024.  She was subsequently hospitalized at [facility] for treatment of hydrocephalus and underwent endoscopic third ventriculostomy on 02/12/2024.  She appears stable to resume treatment.  Plan to proceed with cycle 2 gemcitabine /Abraxane  today as scheduled.  CBC and chemistry panel reviewed.  Labs adequate for treatment.  She has mild hypokalemia.  She currently takes Kdur 20 meq twice daily.  She missed a dose yesterday.  She will take an extra dose tonight.  She will return for follow-up and treatment in 2 weeks.  We are available to see her sooner if needed.  Patient seen with Dr. Cloretta.   Olam Ned ANP/GNP-BC   03/18/2024  1:34 PM This was a shared visit with Olam Ned.  Kathleen Stone was interviewed and examined.  She developed symptomatic hydrocephalus and underwent a ventriculostomy 02/12/2024.  Her symptoms have improved.  She has blurred vision of unclear etiology.  A CT 03/13/2024 revealed resolution of hydrocephalus.  The plan is to proceed with gemcitabine /Abraxane  today.  I was present for greater than 50% of today's visit.  I performed Medical Decision Making.  Arvella Cloretta, MD

## 2024-03-18 NOTE — Patient Instructions (Signed)
 [facility] - A DEPT OF [place]  Discharge Instructions: Thank you for choosing [facility] to provide your oncology and hematology care.   If you have a lab appointment with the Cancer Center, please go directly to the Cancer Center and check in at the registration area.   Wear comfortable clothing and clothing appropriate for easy access to any Portacath or PICC line.   We strive to give you quality time with your provider. You may need to reschedule your appointment if you arrive late (15 or more minutes).  Arriving late affects you and other patients whose appointments are after yours.  Also, if you miss three or more appointments without notifying the office, you may be dismissed from the clinic at the provider's discretion.      For prescription refill requests, have your pharmacy contact our office and allow 72 hours for refills to be completed.    Today you received the following chemotherapy and/or immunotherapy agents Abraxane  and Gemzar       To help prevent nausea and vomiting after your treatment, we encourage you to take your nausea medication as directed.  BELOW ARE SYMPTOMS THAT SHOULD BE REPORTED IMMEDIATELY: *FEVER GREATER THAN 100.4 F (38 C) OR HIGHER *CHILLS OR SWEATING *NAUSEA AND VOMITING THAT IS NOT CONTROLLED WITH YOUR NAUSEA MEDICATION *UNUSUAL SHORTNESS OF BREATH *UNUSUAL BRUISING OR BLEEDING *URINARY PROBLEMS (pain or burning when urinating, or frequent urination) *BOWEL PROBLEMS (unusual diarrhea, constipation, pain near the anus) TENDERNESS IN MOUTH AND THROAT WITH OR WITHOUT PRESENCE OF ULCERS (sore throat, sores in mouth, or a toothache) UNUSUAL RASH, SWELLING OR PAIN  UNUSUAL VAGINAL DISCHARGE OR ITCHING   Items with * indicate a potential emergency and should be followed up as soon as possible or go to the Emergency Department if any problems should occur.  Please show the CHEMOTHERAPY ALERT CARD or  IMMUNOTHERAPY ALERT CARD at check-in to the Emergency Department and triage nurse.  Should you have questions after your visit or need to cancel or reschedule your appointment, please contact [facility] - A DEPT OF [facility]  Dept: [phone]  and follow the prompts.  Office hours are 8:00 a.m. to 4:30 p.m. Monday - Friday. Please note that voicemails left after 4:00 p.m. may not be returned until the following business day.  We are closed weekends and major holidays. You have access to a nurse at all times for urgent questions. Please call the main number to the clinic Dept: [phone] and follow the prompts.   For any non-urgent questions, you may also contact your provider using MyChart. We now offer e-Visits for anyone 31 and older to request care online for non-urgent symptoms. For details visit [email or web address].   Also download the MyChart app! Go to the app store, search "MyChart", open the app, select , and log in with your MyChart username and password.

## 2024-03-18 NOTE — Progress Notes (Signed)
 Patient seen by [name] NP today  Vitals are within treatment parameters:Yes   Labs are within treatment parameters: Yes   Treatment plan has been signed: Yes   Per physician team, Patient is ready for treatment and there are NO modifications to the treatment plan.

## 2024-03-18 NOTE — Progress Notes (Signed)
 Pt c/o pf pain on left side at time of discharge stating that pain was a 9 on 0-10 scale. All providers have already left office for today. Pt informed to go to ED downstairs or take OTC tylenol  at home. PT verbalizes understanding and agrees with plan of care. Pt verbalizes understanding to call [facility] tomorrow during office hours as needed.

## 2024-03-19 ENCOUNTER — Telehealth: Payer: Self-pay | Admitting: *Deleted

## 2024-03-19 LAB — CANCER ANTIGEN 19-9: CA 19-9: 799 U/mL — ABNORMAL HIGH (ref 0–35)

## 2024-03-19 NOTE — Telephone Encounter (Signed)
 Called patient to f/u on her acute pain episode of 7/02 pm. She reports is has resolved.

## 2024-03-29 ENCOUNTER — Other Ambulatory Visit: Payer: Self-pay | Admitting: Oncology

## 2024-04-01 ENCOUNTER — Inpatient Hospital Stay (HOSPITAL_BASED_OUTPATIENT_CLINIC_OR_DEPARTMENT_OTHER): Admitting: Nurse Practitioner

## 2024-04-01 ENCOUNTER — Encounter: Payer: Self-pay | Admitting: Nurse Practitioner

## 2024-04-01 ENCOUNTER — Inpatient Hospital Stay

## 2024-04-01 VITALS — BP 135/67 | HR 74 | Temp 98.3°F | Resp 17 | Ht 66.0 in | Wt 176.6 lb

## 2024-04-01 DIAGNOSIS — Z5111 Encounter for antineoplastic chemotherapy: Secondary | ICD-10-CM | POA: Diagnosis not present

## 2024-04-01 DIAGNOSIS — Z95828 Presence of other vascular implants and grafts: Secondary | ICD-10-CM

## 2024-04-01 DIAGNOSIS — C251 Malignant neoplasm of body of pancreas: Secondary | ICD-10-CM

## 2024-04-01 DIAGNOSIS — E876 Hypokalemia: Secondary | ICD-10-CM

## 2024-04-01 LAB — CMP (CANCER CENTER ONLY)
ALT: 21 U/L (ref 0–44)
AST: 24 U/L (ref 15–41)
Albumin: 3.8 g/dL (ref 3.5–5.0)
Alkaline Phosphatase: 103 U/L (ref 38–126)
Anion gap: 12 (ref 5–15)
BUN: 11 mg/dL (ref 6–20)
CO2: 25 mmol/L (ref 22–32)
Calcium: 9.5 mg/dL (ref 8.9–10.3)
Chloride: 108 mmol/L (ref 98–111)
Creatinine: 0.77 mg/dL (ref 0.44–1.00)
GFR, Estimated: >60 mL/min (ref >60)
Glucose, Bld: 193 mg/dL — ABNORMAL HIGH (ref 70–99)
Potassium: 3.1 mmol/L — ABNORMAL LOW (ref 3.5–5.1)
Sodium: 144 mmol/L (ref 135–145)
Total Bilirubin: 0.3 mg/dL (ref 0.0–1.2)
Total Protein: 6.4 g/dL — ABNORMAL LOW (ref 6.5–8.1)

## 2024-04-01 LAB — CBC WITH DIFFERENTIAL (CANCER CENTER ONLY)
Abs Immature Granulocytes: 0.05 K/uL (ref 0.00–0.07)
Basophils Absolute: 0 K/uL (ref 0.0–0.1)
Basophils Relative: 0 %
Eosinophils Absolute: 0.1 K/uL (ref 0.0–0.5)
Eosinophils Relative: 1 %
HCT: 35.8 % — ABNORMAL LOW (ref 36.0–46.0)
Hemoglobin: 11.7 g/dL — ABNORMAL LOW (ref 12.0–15.0)
Immature Granulocytes: 1 %
Lymphocytes Relative: 24 %
Lymphs Abs: 1.6 K/uL (ref 0.7–4.0)
MCH: 30.2 pg (ref 26.0–34.0)
MCHC: 32.7 g/dL (ref 30.0–36.0)
MCV: 92.3 fL (ref 80.0–100.0)
Monocytes Absolute: 0.5 K/uL (ref 0.1–1.0)
Monocytes Relative: 8 %
Neutro Abs: 4.6 K/uL (ref 1.7–7.7)
Neutrophils Relative %: 66 %
Platelet Count: 186 K/uL (ref 150–400)
RBC: 3.88 MIL/uL (ref 3.87–5.11)
RDW: 17.2 % — ABNORMAL HIGH (ref 11.5–15.5)
WBC Count: 6.9 K/uL (ref 4.0–10.5)
nRBC: 0 % (ref 0.0–0.2)

## 2024-04-01 MED ORDER — SODIUM CHLORIDE 0.9% FLUSH
10.0000 mL | INTRAVENOUS | Status: DC | PRN
  Administered 2024-04-01: 10 mL

## 2024-04-01 MED ORDER — PACLITAXEL PROTEIN-BOUND CHEMO INJECTION 100 MG
100.0000 mg/m2 | Freq: Once | INTRAVENOUS | Status: AC
  Administered 2024-04-01: 200 mg via INTRAVENOUS
  Filled 2024-04-01: qty 40

## 2024-04-01 MED ORDER — HEPARIN SOD (PORK) LOCK FLUSH 100 UNIT/ML IV SOLN
500.0000 [IU] | Freq: Once | INTRAVENOUS | Status: AC | PRN
  Administered 2024-04-01: 500 [IU]

## 2024-04-01 MED ORDER — SODIUM CHLORIDE 0.9 % IV SOLN
1000.0000 mg/m2 | Freq: Once | INTRAVENOUS | Status: AC
  Administered 2024-04-01: 1900 mg via INTRAVENOUS
  Filled 2024-04-01: qty 49.97

## 2024-04-01 MED ORDER — ONDANSETRON HCL 8 MG PO TABS
8.0000 mg | ORAL_TABLET | Freq: Once | ORAL | Status: AC
  Administered 2024-04-01: 8 mg via ORAL
  Filled 2024-04-01: qty 1

## 2024-04-01 MED ORDER — SODIUM CHLORIDE 0.9% FLUSH
10.0000 mL | Freq: Once | INTRAVENOUS | Status: AC
  Administered 2024-04-01: 10 mL via INTRAVENOUS

## 2024-04-01 MED ORDER — SODIUM CHLORIDE 0.9 % IV SOLN: Freq: Once | INTRAVENOUS | Status: AC

## 2024-04-01 NOTE — Patient Instructions (Signed)
 [facility] - A DEPT OF [place]  Discharge Instructions: Thank you for choosing [facility] to provide your oncology and hematology care.   If you have a lab appointment with the Cancer Center, please go directly to the Cancer Center and check in at the registration area.   Wear comfortable clothing and clothing appropriate for easy access to any Portacath or PICC line.   We strive to give you quality time with your provider. You may need to reschedule your appointment if you arrive late (15 or more minutes).  Arriving late affects you and other patients whose appointments are after yours.  Also, if you miss three or more appointments without notifying the office, you may be dismissed from the clinic at the provider's discretion.      For prescription refill requests, have your pharmacy contact our office and allow 72 hours for refills to be completed.    Today you received the following chemotherapy and/or immunotherapy agents abraxane  and gemzar   Gemcitabine  Injection What is this medication? GEMCITABINE  (jem SYE ta been) treats some types of cancer. It works by slowing down the growth of cancer cells. This medicine may be used for other purposes; ask your health care provider or pharmacist if you have questions. COMMON BRAND NAME(S): Gemzar , Infugem  What should I tell my care team before I take this medication? They need to know if you have any of these conditions: Blood disorders Infection Kidney disease Liver disease Lung or breathing disease, such as asthma or COPD Recent or ongoing radiation therapy An unusual or allergic reaction to gemcitabine , other medications, foods, dyes, or preservatives If you or your partner are pregnant or trying to get pregnant Breast-feeding How should I use this medication? This medication is injected into a vein. It is given by your care team in a hospital or clinic setting. Talk to your care team  about the use of this medication in children. Special care may be needed. Overdosage: If you think you have taken too much of this medicine contact a poison control center or emergency room at once. NOTE: This medicine is only for you. Do not share this medicine with others. What if I miss a dose? Keep appointments for follow-up doses. It is important not to miss your dose. Call your care team if you are unable to keep an appointment. What may interact with this medication? Interactions have not been studied. This list may not describe all possible interactions. Give your health care provider a list of all the medicines, herbs, non-prescription drugs, or dietary supplements you use. Also tell them if you smoke, drink alcohol, or use illegal drugs. Some items may interact with your medicine. What should I watch for while using this medication? Your condition will be monitored carefully while you are receiving this medication. This medication may make you feel generally unwell. This is not uncommon, as chemotherapy can affect healthy cells as well as cancer cells. Report any side effects. Continue your course of treatment even though you feel ill unless your care team tells you to stop. In some cases, you may be given additional medications to help with side effects. Follow all directions for their use. This medication may increase your risk of getting an infection. Call your care team for advice if you get a fever, chills, sore throat, or other symptoms of a cold or flu. Do not treat yourself. Try to avoid being around people who are sick. This medication  may increase your risk to bruise or bleed. Call your care team if you notice any unusual bleeding. Be careful brushing or flossing your teeth or using a toothpick because you may get an infection or bleed more easily. If you have any dental work done, tell your dentist you are receiving this medication. Avoid taking medications that contain aspirin ,  acetaminophen , ibuprofen, naproxen , or ketoprofen unless instructed by your care team. These medications may hide a fever. Talk to your care team if you or your partner wish to become pregnant or think you might be pregnant. This medication can cause serious birth defects if taken during pregnancy and for 6 months after the last dose. A negative pregnancy test is required before starting this medication. A reliable form of contraception is recommended while taking this medication and for 6 months after the last dose. Talk to your care team about effective forms of contraception. Do not father a child while taking this medication and for 3 months after the last dose. Use a condom while having sex during this time period. Do not breastfeed while taking this medication and for at least 1 week after the last dose. This medication may cause infertility. Talk to your care team if you are concerned about your fertility. What side effects may I notice from receiving this medication? Side effects that you should report to your care team as soon as possible: Allergic reactions--skin rash, itching, hives, swelling of the face, lips, tongue, or throat Capillary leak syndrome--stomach or muscle pain, unusual weakness or fatigue, feeling faint or lightheaded, decrease in the amount of urine, swelling of the ankles, hands, or feet, trouble breathing Infection--fever, chills, cough, sore throat, wounds that don't heal, pain or trouble when passing urine, general feeling of discomfort or being unwell Liver injury--right upper belly pain, loss of appetite, nausea, light-colored stool, dark yellow or brown urine, yellowing skin or eyes, unusual weakness or fatigue Low red blood cell level--unusual weakness or fatigue, dizziness, headache, trouble breathing Lung injury--shortness of breath or trouble breathing, cough, spitting up blood, chest pain, fever Stomach pain, bloody diarrhea, pale skin, unusual weakness or fatigue,  decrease in the amount of urine, which may be signs of hemolytic uremic syndrome Sudden and severe headache, confusion, change in vision, seizures, which may be signs of posterior reversible encephalopathy syndrome (PRES) Unusual bruising or bleeding Side effects that usually do not require medical attention (report to your care team if they continue or are bothersome): Diarrhea Drowsiness Hair loss Nausea Pain, redness, or swelling with sores inside the mouth or throat Vomiting This list may not describe all possible side effects. Call your doctor for medical advice about side effects. You may report side effects to FDA at 1-800-FDA-1088. Where should I keep my medication? This medication is given in a hospital or clinic. It will not be stored at home. NOTE: This sheet is a summary. It may not cover all possible information. If you have questions about this medicine, talk to your doctor, pharmacist, or health care provider.  2024 Elsevier/Gold Standard (2022-01-09 00:00:00)  Paclitaxel  Nanoparticle Albumin-Bound Injection What is this medication? NANOPARTICLE ALBUMIN-BOUND PACLITAXEL  (Na no PAHR ti kuhl al BYOO muhn-bound PAK li TAX el) treats some types of cancer. It works by slowing down the growth of cancer cells. This medicine may be used for other purposes; ask your health care provider or pharmacist if you have questions. COMMON BRAND NAME(S): Abraxane  What should I tell my care team before I take this medication? They need  to know if you have any of these conditions: Liver disease Low white blood cell levels An unusual or allergic reaction to paclitaxel , albumin, other medications, foods, dyes, or preservatives If you or your partner are pregnant or trying to get pregnant Breast-feeding How should I use this medication? This medication is injected into a vein. It is given by your care team in a hospital or clinic setting. Talk to your care team about the use of this medication  in children. Special care may be needed. Overdosage: If you think you have taken too much of this medicine contact a poison control center or emergency room at once. NOTE: This medicine is only for you. Do not share this medicine with others. What if I miss a dose? Keep appointments for follow-up doses. It is important not to miss your dose. Call your care team if you are unable to keep an appointment. What may interact with this medication? Other medications may affect the way this medication works. Talk with your care team about all of the medications you take. They may suggest changes to your treatment plan to lower the risk of side effects and to make sure your medications work as intended. This list may not describe all possible interactions. Give your health care provider a list of all the medicines, herbs, non-prescription drugs, or dietary supplements you use. Also tell them if you smoke, drink alcohol, or use illegal drugs. Some items may interact with your medicine. What should I watch for while using this medication? Your condition will be monitored carefully while you are receiving this medication. You may need blood work while taking this medication. This medication may make you feel generally unwell. This is not uncommon as chemotherapy can affect healthy cells as well as cancer cells. Report any side effects. Continue your course of treatment even though you feel ill unless your care team tells you to stop. This medication can cause serious allergic reactions. To reduce the risk, your care team may give you other medications to take before receiving this one. Be sure to follow the directions from your care team. This medication may increase your risk of getting an infection. Call your care team for advice if you get a fever, chills, sore throat, or other symptoms of a cold or flu. Do not treat yourself. Try to avoid being around people who are sick. This medication may increase your risk  to bruise or bleed. Call your care team if you notice any unusual bleeding. Be careful brushing or flossing your teeth or using a toothpick because you may get an infection or bleed more easily. If you have any dental work done, tell your dentist you are receiving this medication. Talk to your care team if you or your partner may be pregnant. Serious birth defects can occur if you take this medication during pregnancy and for 6 months after the last dose. You will need a negative pregnancy test before starting this medication. Contraception is recommended while taking this medication and for 6 months after the last dose. Your care team can help you find the option that works for you. If your partner can get pregnant, use a condom during sex while taking this medication and for 3 months after the last dose. Do not breastfeed while taking this medication and for 2 weeks after the last dose. This medication may cause infertility. Talk to your care team if you are concerned about your fertility. What side effects may I notice from receiving this  medication? Side effects that you should report to your care team as soon as possible: Allergic reactions--skin rash, itching, hives, swelling of the face, lips, tongue, or throat Dry cough, shortness of breath or trouble breathing Infection--fever, chills, cough, sore throat, wounds that don't heal, pain or trouble when passing urine, general feeling of discomfort or being unwell Low red blood cell level--unusual weakness or fatigue, dizziness, headache, trouble breathing Pain, tingling, or numbness in the hands or feet Stomach pain, unusual weakness or fatigue, nausea, vomiting, diarrhea, or fever that lasts longer than expected Unusual bruising or bleeding Side effects that usually do not require medical attention (report to your care team if they continue or are bothersome): Diarrhea Fatigue Hair loss Loss of appetite Nausea Vomiting This list may not  describe all possible side effects. Call your doctor for medical advice about side effects. You may report side effects to FDA at 1-800-FDA-1088. Where should I keep my medication? This medication is given in a hospital or clinic. It will not be stored at home. NOTE: This sheet is a summary. It may not cover all possible information. If you have questions about this medicine, talk to your doctor, pharmacist, or health care provider.  2024 Elsevier/Gold Standard (2022-01-18 00:00:00)      To help prevent nausea and vomiting after your treatment, we encourage you to take your nausea medication as directed.  BELOW ARE SYMPTOMS THAT SHOULD BE REPORTED IMMEDIATELY: *FEVER GREATER THAN 100.4 F (38 C) OR HIGHER *CHILLS OR SWEATING *NAUSEA AND VOMITING THAT IS NOT CONTROLLED WITH YOUR NAUSEA MEDICATION *UNUSUAL SHORTNESS OF BREATH *UNUSUAL BRUISING OR BLEEDING *URINARY PROBLEMS (pain or burning when urinating, or frequent urination) *BOWEL PROBLEMS (unusual diarrhea, constipation, pain near the anus) TENDERNESS IN MOUTH AND THROAT WITH OR WITHOUT PRESENCE OF ULCERS (sore throat, sores in mouth, or a toothache) UNUSUAL RASH, SWELLING OR PAIN  UNUSUAL VAGINAL DISCHARGE OR ITCHING   Items with * indicate a potential emergency and should be followed up as soon as possible or go to the Emergency Department if any problems should occur.  Please show the CHEMOTHERAPY ALERT CARD or IMMUNOTHERAPY ALERT CARD at check-in to the Emergency Department and triage nurse.  Should you have questions after your visit or need to cancel or reschedule your appointment, please contact [facility] - A DEPT OF [facility]  Dept: [phone]  and follow the prompts.  Office hours are 8:00 a.m. to 4:30 p.m. Monday - Friday. Please note that voicemails left after 4:00 p.m. may not be returned until the following business day.  We are closed weekends and major holidays. You have access to a  nurse at all times for urgent questions. Please call the main number to the clinic Dept: [phone] and follow the prompts.   For any non-urgent questions, you may also contact your provider using MyChart. We now offer e-Visits for anyone 56 and older to request care online for non-urgent symptoms. For details visit [email or web address].   Also download the MyChart app! Go to the app store, search MyChart, open the app, select [place], and log in with your MyChart username and password.

## 2024-04-01 NOTE — Patient Instructions (Signed)

## 2024-04-01 NOTE — Progress Notes (Addendum)
 Patient seen by Olam Ned NP today  Vitals are within treatment parameters:Yes  Increased 6 pounds Labs are within treatment parameters: Yes  Potassium-3.1 okay to proceed Treatment plan has been signed: Yes   Per physician team, Patient is ready for treatment. Please note the following modifications:  Per provider pre-Medications changed from Compazine  to Zofran  8mg 

## 2024-04-01 NOTE — Progress Notes (Signed)
 [facility] OFFICE PROGRESS NOTE   Diagnosis: Pancreas cancer  INTERVAL HISTORY:   [name] returns as scheduled.  She completed cycle 2 gemcitabine /Abraxane  03/18/2024.  She reports developing nausea/vomiting on day 2.  This lasted for about 4 days.  She took one of her 2 home nausea medications with some relief.  No mouth sores.  She is having loose bowel movements after eating.  She discontinued pancreatic enzyme replacement many months ago.  No rash or fever after treatment.  She is no longer having abdominal pain.  She denies headaches.  No dizziness.  No balance issues.  Stable blurred vision.  Objective:  Vital signs in last 24 hours:  Blood pressure 135/67, pulse 74, temperature 98.3 F (36.8 C), temperature source Temporal, resp. rate 17, height 5' 6 (1.676 m), weight 176 lb 9.6 oz (80.1 kg), last menstrual period 02/07/2016, SpO2 100%.    HEENT: No thrush or ulcers. Resp: Lungs clear bilaterally. Cardio: Regular rate and rhythm. GI: No hepatosplenomegaly.  No mass. Vascular: No leg edema. Neuro: Alert and oriented. Port-A-Cath without erythema.  Lab Results:  Lab Results  Component Value Date   WBC 6.9 04/01/2024   HGB 11.7 (L) 04/01/2024   HCT 35.8 (L) 04/01/2024   MCV 92.3 04/01/2024   PLT 186 04/01/2024   NEUTROABS 4.6 04/01/2024    Imaging:  No results found.  Medications: I have reviewed the patient's current medications.  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 CT abdomen/pelvis 07/30/2022-body/neck pancreas mass with peripheral pancreatic atrophy and duct dilation, suspected encasement of the celiac axis, uterine fibroids MRI abdomen 07/30/2022-3.7 x 4.7 cm pancreas body/tail mass with encasement of the proximal celiac artery, occlusion with collateralization at the confluence of the SMV and portal vein, no evidence of metastatic disease CT chest 08/01/2022-no intrathoracic metastases Elevated CA 19-9 EUS 08/02/2022-gastric polyp,  gastric erosions, 32 x 33 mm pancreas body mass, invasion of the celiac trunk, invasion of the splenoportal confluence, no SMA invasion, 2 malignant appearing nodes in the porta hepatis,T4N1 by endoscopic criteria, FNA biopsy-adenocarcinoma 08/17/2022 [facility]-tumor mutation burden 0.96; MSI high not detected; 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  Stable porta hepatis lymph nodes. Cycle 7 gemcitabine /Abraxane  11/09/2022 Cycle 8 gemcitabine /Abraxane  11/23/2022 Cycle 9 gemcitabine /Abraxane  12/07/2022 Cycle 10 gemcitabine /Abraxane  12/21/2022 Cycle 11 gemcitabine /Abraxane  01/04/2023 Cycle 12 gemcitabine /Abraxane  01/18/2023 Cycle 13 gemcitabine /Abraxane  02/01/2023 Cycle 14 gemcitabine /Abraxane  02/15/2023 CT abdomen/pelvis 02/26/2023-stable appearance of pancreas body mass with vascular encasement and chronic occlusion of the splenic vein, no evidence of metastatic disease Cycle 15 gemcitabine /Abraxane  03/01/2023 Cycle 16 gemcitabine /Abraxane  03/15/2023 Cycle 17 Gemcitabine /Abraxane  03/29/2023 EUS pancreas fiducial marker placement 04/17/2023 Pancreas SBRT 05/07/2023 - 05/17/2023, 33 Gray in 5 fractions CT abdomen/pelvis 09/14/2023: Slight decrease in pancreas body mass with persistent vascular encasement, no evidence of metastatic disease CTs [facility] 01/02/2024-ill-defined hypoattenuating mass in the mid pancreatic body surrounding surgical clips.  Pancreatic duct within the distal pancreatic body and tail is dilated.  Mass encases the celiac trunk.  Common bile duct is dilated with suggestion of  focal stenosis in the mid bile duct.  High-grade stenosis/occlusion of the portal vein.  Prominent vessels in the upper abdomen.  Heterogeneous appearance of the splenic parenchyma with suggestion of an infiltrating hypoattenuating mass/masses  versus sequela of splenic infarcts.  Multiple pulmonary nodules in the lung bases concerning for metastatic disease. CTs chest/abdomen/pelvis 01/06/2024-no PE.  Cardiomegaly with small pericardial effusion.  Small irregular focus of density medial right lung base.  Poorly defined pancreatic body mass measuring 3 x 2.2 cm, previously 3 x 2.5 cm, possible extension of infiltrative soft tissue towards the pancreatic neck region with ill-defined density at the porta hepatis compared to prior.  Slight increased extrahepatic biliary and common duct dilatation compared to prior.  No focal splenic abnormality. Cycle 1 gemcitabine /abraxane  02/07/2024 Cycle 2 gemcitabine /Abraxane  03/18/2024 Cycle 3 gemcitabine /Abraxane  04/01/2024, premed changed from Compazine  to Zofran      2.  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  Similar size of of enhancing mass in the dorsal right midbrain effacing the cerebral aqueduct.  No new enhancing lesions identified.  She was transferred to [facility].  She underwent an endoscopic third ventriculostomy on 02/12/2024.   Brain CT 03/13/2024-interval resolution of obstructive hydrocephalus and transependymal edema.  Mild interval decrease in size of a mass within the right midbrain, compatible with glioma.  Chronic encephalomalacia changes in the right frontal lobe. 02/12/2024: Endoscopic ventriculostomy at [facility]   3.  Anorexia/weight loss secondary to #1 4.  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  Abdominal pain 6.  History of colon polyps 7.  Family history of multiple cancers Pancreas cancer  maternal aunt, maternal cousin, maternal grandfather Breast cancer-maternal aunt, maternal cousin Prostate cancer-maternal uncle Lung cancer-father 8.  Hypertension 9.  Postmenopausal vaginal bleeding 10.  H. pylori 08/02/2022-amoxicillin  and Flagyl  discontinued 08/21/2022 due to GI symptoms 11.  Syncope event 03/07/2023 12.  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 Demartini appears stable.  She has completed 2 cycles of gemcitabine /Abraxane .  Abdominal pain is better.  She had nausea beginning day 2.  Premed adjusted.  Plan to proceed with treatment as scheduled.  She will contact the office with poorly controlled nausea.  She has 2 nausea medications at home but is unsure of the names.  She will check when she gets home and let us  know.  CBC and chemistry panel reviewed.  Labs adequate for treatment.  She has persistent hypokalemia.  Kdur adjusted to 3 tabs per day for the next 3 days then resume 2 tablets daily.    The loose stools after eating may be due to pancreatic insufficiency.  She will resume pancreatic enzyme replacement.  She will return for follow-up and treatment in 2 weeks.  We are available to see her sooner if needed.    Olam Ned ANP/GNP-BC   04/01/2024  11:04 AM

## 2024-04-02 ENCOUNTER — Other Ambulatory Visit: Payer: Self-pay

## 2024-04-02 LAB — CANCER ANTIGEN 19-9: CA 19-9: 816 U/mL — ABNORMAL HIGH (ref 0–35)

## 2024-04-02 NOTE — Patient Outreach (Signed)
 Complex Care Management   Visit Note  04/02/2024  Name:  Kathleen Stone MRN: [id number] DOB: 03-28-1966  Situation: Referral received for Complex Care Management related to Pancreatic Mass I obtained verbal consent from Patient.  Visit completed with patient   on the phone  Background:   Past Medical History:  Diagnosis Date   Abnormal stress test 01/16/2017   Brain tumor (benign) (HCC)    DJD (degenerative joint disease) of cervical spine    Galactorrhea of both breasts 2009   R. sided w/ benign papilloma excised in 4/09. L sided in 5/09   Hematuria, microscopic 02/24/2020   Hyperlipidemia    Hypertension    Hypokalemia    LOW BACK PAIN SYNDROME 07/10/2007   Qualifier: Diagnosis of   By: Lanis MD, Christopher         Nausea and vomiting 08/01/2022   Pancreatic cancer [facility])    Personal history of adenomatous colonic polyp    09/2007 - diminutive adenoma [facility])   Stroke [facility])    2021- brain tumor bx caused ? stroke-    Tobacco abuse    Unintentional weight loss 05/26/2022    Assessment: Patient Reported Symptoms:  Cognitive Cognitive Status: Able to follow simple commands, Alert and oriented to person, place, and time, Normal speech and language skills      Neurological Neurological Review of Symptoms: No symptoms reported    HEENT HEENT Symptoms Reported: No symptoms reported      Cardiovascular Cardiovascular Symptoms Reported: No symptoms reported    Respiratory Respiratory Symptoms Reported: No symptoms reported    Endocrine Endocrine Symptoms Reported: No symptoms reported    Gastrointestinal Gastrointestinal Symptoms Reported: Flatulence, Diarrhea Gastrointestinal Management Strategies: Medication therapy    Genitourinary Genitourinary Symptoms Reported: No symptoms reported    Integumentary Integumentary Symptoms Reported: No symptoms reported    Musculoskeletal Musculoskelatal Symptoms Reviewed: No symptoms reported   Falls in the past year?: No     Psychosocial       Quality of Family Relationships: involved, supportive Do you feel physically threatened by others?: No      04/02/2024    9:27 AM  Depression screen PHQ 2/9  Decreased Interest 1  Down, Depressed, Hopeless 0  PHQ - 2 Score 1    There were no vitals filed for this visit.  Medications Reviewed Today     Reviewed by Weyman Corning, RN (Registered Nurse) on 04/02/24 at [id number]  Med List Status: <None>   Medication Order Taking? Sig Documenting Provider Last Dose Status Informant  acetaminophen  (TYLENOL ) 325 MG tablet [id number] Yes Take by mouth every 6 (six) hours as needed. [provider]  Active   albuterol  (VENTOLIN  HFA) 108 (90 Base) MCG/ACT inhaler [id number] Yes Inhale 2 puffs into the lungs every 6 (six) hours as needed for wheezing or shortness of breath. Cloretta Arley NOVAK, MD  Active   amLODipine  (NORVASC ) 5 MG tablet [id number] Yes Take 1 tablet (5 mg total) by mouth daily. Heddy Barren, DO  Active   apixaban  (ELIQUIS ) 5 MG TABS tablet [id number] Yes Take 1 tablet (5 mg total) by mouth 2 (two) times daily. Heddy Barren, DO  Active            Med Note MERNA, DEVERE LITTIE   Fri Feb 21, 2024  9:00 AM) On Hold x 2 weeks  lidocaine -prilocaine  (EMLA ) cream [id number] Yes Apply 1 Application topically as needed. Cloretta Arley NOVAK, MD  Active   lipase/protease/amylase (CREON ) 36000 UNITS CPEP capsule [id number]  Yes Take 2 capsules (72,000 Units total) by mouth 3 (three) times daily with meals. Debby Olam POUR, NP  Active   loperamide (IMODIUM) 2 MG capsule [id number] Yes Take 2-4 mg by mouth as needed for diarrhea or loose stools. Maximum 8/day [provider]  Active   mirtazapine  (REMERON ) 7.5 MG tablet [id number] Yes Take 1 tablet (7.5 mg total) by mouth at bedtime. Rainelle Pfeiffer, MD  Active   nitroGLYCERIN  (NITROSTAT ) 0.4 MG SL tablet [id number] Yes Place 1 tablet (0.4 mg total) under the tongue every 5 (five) minutes as needed for chest pain. Atway,  Rayann N, DO  Active Self  ondansetron  (ZOFRAN ) 8 MG tablet [id number] Yes Take 1 tablet (8 mg total) by mouth every 8 (eight) hours as needed. Cloretta Arley NOVAK, MD  Active   pantoprazole  (PROTONIX ) 40 MG tablet [id number] Yes Take 1 tablet (40 mg total) by mouth daily. Cloretta Arley NOVAK, MD  Active   potassium chloride  SA (KLOR-CON  M) 20 MEQ tablet [id number] Yes Take 1 tablet (20 mEq total) by mouth as directed. Take 1 tablet (20 meq) night of 6/6; then 1 tablet (20 meq) twice daily x 3 days; then 1 tablet (20 meq) daily Debby Olam POUR, NP  Active             Recommendation:   Specialty provider follow-up Oncology for any issues  Follow Up Plan:   Telephone follow up appointment with care management team member scheduled for:  05/04/24  915 am with Olam Hoots RNCM  Wilbert Diver RN, BSN, [facility]  [facility], [facility]    Care Coordinator Phone: [phone]

## 2024-04-02 NOTE — Patient Instructions (Signed)
 Visit Information  [name] was given information about Medicaid Managed Care team care coordination services as a part of their [employer] Medicaid benefit. Kathleen Stone verbally consentedto engagement with the [facility] Managed Care team.   If you are experiencing a medical emergency, please call 911 or report to your local emergency department or urgent care.   If you have a non-emergency medical problem during routine business hours, please contact your provider's office and ask to speak with a nurse.   For questions related to your [employer] [facility] health plan, please call: [phone]  OR visit the member homepage at: [email or web address]  If you would like to schedule transportation through your [facility] plan, please call the following number at least 2 days in advance of your appointment: [phone]  If you are experiencing a behavioral health crisis, call the [employer] [place]  Behavioral Health Crisis Line at [phone]). The line is available 24 hours a day, seven days a week.  If you would like help to quit smoking, call 1-800-QUIT-NOW (463-370-2732) OR Espaol: 1-855-Djelo-Ya (8-144-664-6430) o para ms informacin haga clic aqu or Text READY to 799-599 to register via text   Kathleen Stone - following are the goals we discussed in your visit today:    Goals Addressed             This Visit's Progress    VBCI RN Care Plan- Mass       Problems:  Chronic Disease Management support and education needs related to Pancreatic Mass  Goal: Over the next 60 days the Patient will attend all scheduled medical appointments: with PCP and specialist as evidenced by keeping al scheduled appointments        demonstrate Ongoing adherence to prescribed treatment plan for Pancreatic Mass as evidenced by no admissions to the hospital verbalize basic understanding of  Pancreatic Mass disease process and self health management plan as evidenced by verbal explanation lifestyle changes and consistent medication compliance   Interventions:   Evaluation of current treatment plan related to Pancreatic Mass, self-management and patient's adherence to plan as established by provider. Discussed plans with patient for ongoing care management follow up and provided patient with direct contact information for care management team Evaluation of current treatment plan related to  patient's adherence to plan as established by provider Reviewed medications with patient and discussed - take potassium as prescribed by oncology clinic- due to low potassium Discussed plans with patient for ongoing care management follow up and provided patient with direct contact information for care management team Screening for signs and symptoms of depression related to chronic disease state  Get rest Continue to eat well balance meals - small frequent meals low fat diet and take pain creatic enzymes Wear a mask when out in public to prevent getting any infection due to low immune system Make sure to keep your pain under control when necessary- use Tylenol  or warm compresses Stay hydrated with water , broths, and electrolytes. Always find support with your family and friends  Patient Self-Care Activities:  Attend all scheduled provider appointments Call pharmacy for medication refills 3-7 days in advance of running out of medications Call provider office for new concerns or questions  Perform all self care activities independently  Perform IADL's (shopping, preparing meals, housekeeping, managing finances) independently Take medications as prescribed    Plan:  Telephone follow up appointment with care management team member scheduled for:  05/04/24  915 am with Olam Hoots [facility]  Please see education materials related to Pancreatic Mass provided by MyChart  link.  Patient verbalizes understanding of instructions and care plan provided today and agrees to view in MyChart. Active MyChart status and patient understanding of how to access instructions and care plan via MyChart confirmed with patient.     Telephone follow up appointment with care management team member scheduled for:  05/04/24  915 am with Olam Hoots RNCM  Kathleen Diver RN, BSN, [facility]  [facility], [facility]    Care Coordinator Phone: [phone]      Following is a copy of your plan of care:  There are no care plans that you recently modified to display for this patient.

## 2024-04-04 ENCOUNTER — Other Ambulatory Visit: Payer: Self-pay

## 2024-04-08 NOTE — Progress Notes (Unsigned)
 BH MD Outpatient Progress Note  04/09/2024 11:44 AM Kathleen Stone  MRN:  [id number]  Assessment:  Kathleen Stone presents for follow-up evaluation in-person on 04/09/24 .   The patient continues to tolerate Remeron  well without notable side effects. On today's evaluation, she denies acute mood symptoms and appears accepting of her medical status, expressing a desire to improve quality of life through social engagement and considering reentering the dating scene. On today's evaluation, she denies acute mood symptoms and demonstrates an accepting attitude toward her medical condition, appearing to be in an acceptance stage of change regarding her diagnosis. She expresses interest in improving quality of life, expanding her social circle, and reentering the dating scene.  Persistent perceptual disturbances remain but are chronic and stable. The patient continues to decline antipsychotic treatment. These symptoms are likely related to underlying neurological pathology or medical factors rather than a primary psychotic disorder.  Plan is to maintain current Remeron  dose for mood and appetite support, provide supportive therapy, and encourage psychosocial engagement. No acute safety concerns identified at this time.   Identifying Information: Kathleen Stone is a 58 y.o. female with a history of psychosis, insomnia and appetite issues who is an established patient with [facility] for management of poor appetite  and insomnia. She was formerly a patient of Dr. Rainelle.  For a comprehensive history and detailed assessment, please refer to the initial adult assessment.  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  Plan:  # Psychosis, unspecified #Insomnia #Appetite  suppression Past medication trials:  Status of problem: New to this Clinical research associate; nondistressing Interventions: -- Continue Remeron  7.5 mg nightly for insomnia and appetite suppression.             - No need to address psychotic features at this time, as it is not distressing.  May consider trial of antipsychotic in the future if indicated.             - Management of pilocytic astrocytoma and obstructive hydrocephalus per neurosurgery and oncology   Patient was given contact information for behavioral health clinic and was instructed to call 911 for emergencies.   Subjective:  Chief Complaint:  Chief Complaint  Patient presents with   Medication Refill   Follow-up    Interval History:  Patient reports being in good spirits and denies significant symptoms of anxiety or depression during today's visit. She reports tolerating Remeron  overall but notes occasionally missing doses on chemotherapy days due to nausea.  She describes her sleep as adequate and appetite as fair. She acknowledges ongoing challenges with eating consistently but states she makes an effort to supplement with protein shakes.  Regarding her cancer diagnosis, the patient reports no significant emotional distress and expresses feeling at peace with her condition. Her focus is on expanding her social circle and engaging in more activities. Time was taken during the visit to review available support groups and community events that align with her interests; she was amenable to these resources and plans to attend in approximately one month.  Psychotic Symptoms: Patient notes a history of hallucinations, which she states may remain chronic. She denies any command hallucinations. She reports these experiences mostly occur in the evenings after turning off the television. Additionally, she describes perceiving a presence or spirit behind her.  Sleep: Adequate. Appetite: Fair; supplemented with  protein shakes. SI/HI:  Denies.    Visit Diagnosis:    ICD-10-CM   1. Insomnia, unspecified type  G47.00 mirtazapine  (REMERON ) 7.5 MG tablet    2. Psychosis, unspecified psychosis type (HCC)  F29       Past Psychiatric History:  Diagnoses: Denies Medication trials: Cymbalta  Previous psychiatrist/therapist: [name], LCSW. Previously saw Kathleen Stone for 2-3 months.  Hospitalizations: Denies Suicide attempts: Denies SIB: Denies Hx of violence towards others: Denies Current access to guns: Yes, locked.  Hx of trauma/abuse: Denies Head trauma/Seizures: Denies  Social History   Socioeconomic History   Marital status: Legally Separated    Spouse name: Not on file   Number of children: Not on file   Years of education: Not on file   Highest education level: Not on file  Occupational History   Occupation: unemployed - applied for disability  Tobacco Use   Smoking status: Every Day    Current packs/day: 1.50    Average packs/day: 1.5 packs/day for 36.0 years (54.0 ttl pk-yrs)    Types: Cigarettes   Smokeless tobacco: Never  Vaping Use   Vaping status: Former  Substance and Sexual Activity   Alcohol use: No    Alcohol/week: 0.0 standard drinks of alcohol   Drug use: No   Sexual activity: Yes    Partners: Female  Other Topics Concern   Not on file  Social History Narrative   Domestic Partner: female has emphysema   Smoked 2 packs/day for 20+ years, down to 1/2ppd now, willing to try to quit    Alcohol use-no   Drug use-no   Regular exercise-no   Social Drivers of Health   Financial Resource Strain: Low Risk  (02/11/2024)   [facility]   Overall Financial Resource Strain (CARDIA)    Difficulty of Paying Living Expenses: Not very hard  Food Insecurity: No Food Insecurity (04/02/2024)   Hunger Vital Sign    Worried About Running Out of Food in the Last Year: Never true    Ran Out of Food in the Last Year: Never true  Transportation Needs: No Transportation Needs  (04/02/2024)   PRAPARE - Administrator, Civil Service (Medical): No    Lack of Transportation (Non-Medical): No  Physical Activity: Not on file  Stress: Not on file  Social Connections: Not on file    Allergies:  Allergies  Allergen Reactions   Iohexol  Hives, Itching and Other (See Comments)    Benadryl  50mg  PO one hour before iodinated contrast studies.  Jkl, per pt she only has itching when she urinates, not with injection 08/01/22 [facility]    Current Medications: Current Outpatient Medications  Medication Sig Dispense Refill   albuterol  (VENTOLIN  HFA) 108 (90 Base) MCG/ACT inhaler Inhale 2 puffs into the lungs every 6 (six) hours as needed for wheezing or shortness of breath. 6.7 g 1   amLODipine  (NORVASC ) 5 MG tablet Take 1 tablet (5 mg total) by mouth daily. 30 tablet 11   lidocaine -prilocaine  (EMLA ) cream Apply 1 Application topically as needed. 30 g 0   nitroGLYCERIN  (NITROSTAT ) 0.4 MG SL tablet Place 1 tablet (0.4 mg total) under the tongue every 5 (five) minutes as needed for chest pain. 25 tablet 1   ondansetron  (ZOFRAN ) 8 MG tablet Take 1 tablet (8 mg total) by mouth every 8 (eight) hours as needed. 30 tablet 1   pantoprazole  (PROTONIX ) 40 MG tablet Take 1 tablet (40 mg total) by mouth daily. 30 tablet 1  potassium chloride  SA (KLOR-CON  M) 20 MEQ tablet Take 1 tablet (20 mEq total) by mouth as directed. Take 1 tablet (20 meq) night of 6/6; then 1 tablet (20 meq) twice daily x 3 days; then 1 tablet (20 meq) daily 33 tablet 2   acetaminophen  (TYLENOL ) 325 MG tablet Take by mouth every 6 (six) hours as needed. (Patient not taking: Reported on 04/09/2024)     apixaban  (ELIQUIS ) 5 MG TABS tablet Take 1 tablet (5 mg total) by mouth 2 (two) times daily. 60 tablet 3   lipase/protease/amylase (CREON ) 36000 UNITS CPEP capsule Take 2 capsules (72,000 Units total) by mouth 3 (three) times daily with meals. (Patient not taking: Reported on 04/09/2024) 180 capsule 4   loperamide  (IMODIUM) 2 MG capsule Take 2-4 mg by mouth as needed for diarrhea or loose stools. Maximum 8/day (Patient not taking: Reported on 04/09/2024)     [START ON 04/20/2024] mirtazapine  (REMERON ) 7.5 MG tablet Take 1 tablet (7.5 mg total) by mouth at bedtime. 30 tablet 1   No current facility-administered medications for this visit.    ROS: Review of Systems  All other systems reviewed and are negative.   Objective:  Objective: Psychiatric Specialty Exam: General Appearance: Casual, fairly groomed  Eye Contact:  Good    Speech:  Clear, coherent, normal rate, spontaneous  Volume:  Normal   Mood:  see above  Affect:  Appropriate, congruent, full range  Thought Content: Logical, r  Suicidal Thoughts: see subjective  Thought Process:  Coherent, goal-directed,   Orientation:  A&Ox4   Memory:  Immediate good  Judgment:  Good  Insight:  Good  Concentration:  Attention and concentration good   Recall:  Good  Fund of Knowledge: Good  Language: Good, fluent  Psychomotor Activity: Normal  Akathisia:  NA   AIMS (if indicated): NA   Assets:   Communication Skills Desire for Improvement Housing Resilience  ADL's:  Intact  Cognition: WNL  Sleep: see above  Appetite: see above    Physical Exam Vitals reviewed.  Constitutional:      General: She is not in acute distress.    Appearance: She is not ill-appearing.  HENT:     Head: Normocephalic and atraumatic.  Eyes:     Extraocular Movements: Extraocular movements intact.  Pulmonary:     Effort: Pulmonary effort is normal. No respiratory distress.  Skin:    General: Skin is warm and dry.      Metabolic Disorder Labs: Lab Results  Component Value Date   HGBA1C 4.9 09/09/2023   MPG 93.93 09/09/2023   MPG 119.76 11/16/2021   Lab Results  Component Value Date   PROLACTIN 7.7 10/21/2007   Lab Results  Component Value Date   CHOL 174 11/16/2021   TRIG 149 11/16/2021   HDL 38 (L) 11/16/2021   CHOLHDL 4.6 11/16/2021   VLDL  30 11/16/2021   LDLCALC 106 (H) 11/16/2021   LDLCALC 124 (H) 06/13/2020   Lab Results  Component Value Date   TSH 0.552 05/02/2022   TSH 2.164 11/16/2021    Therapeutic Level Labs: No results found for: LITHIUM No results found for: VALPROATE No results found for: CBMZ  Screenings:  GAD-7    Flowsheet Row Clinical Support from 04/09/2024 in [facility] Counselor from 10/28/2023 in [place] Office Visit from 11/01/2021 in [facility] - A Dept Of [place]. [facility]  Total GAD-7 Score 3 21 19  PHQ2-9    Flowsheet Row Clinical Support from 04/09/2024 in [facility] Patient Outreach Telephone from 04/02/2024 in [place] POPULATION HEALTH DEPARTMENT Office Visit from 03/18/2024 in [facility] - A Dept Of [place]. [facility] Patient Outreach Telephone from 03/04/2024 in  POPULATION HEALTH DEPARTMENT Office Visit from 02/25/2024 in [facility]  PHQ-2 Total Score 0 1 0 0 1   Flowsheet Row Office Visit from 04/01/2024 in [facility] - A Dept Of [place]. [facility] Office Visit from 03/18/2024 in [facility] - A Dept Of [place]. [facility] ED from 02/08/2024 in [facility] Emergency Department at [facility]  C-SSRS RISK CATEGORY No Risk No Risk No Risk    Collaboration of Care:   Patient/Guardian was advised Release of Information must be obtained prior to any record release in order to collaborate their care with an outside provider. Patient/Guardian was advised if they have not already done so to contact the registration department to sign all necessary forms in order for us  to release information regarding their care.   Consent: Patient/Guardian gives verbal consent for treatment and assignment of benefits for services provided during this visit.  Patient/Guardian expressed understanding and agreed to proceed.    Marlo Masson, MD 04/09/2024, 11:44 AM

## 2024-04-09 ENCOUNTER — Ambulatory Visit (INDEPENDENT_AMBULATORY_CARE_PROVIDER_SITE_OTHER): Admitting: Student in an Organized Health Care Education/Training Program

## 2024-04-09 VITALS — BP 155/77 | HR 66 | Ht 66.0 in | Wt 170.0 lb

## 2024-04-09 DIAGNOSIS — G4700 Insomnia, unspecified: Secondary | ICD-10-CM | POA: Diagnosis not present

## 2024-04-09 DIAGNOSIS — F29 Unspecified psychosis not due to a substance or known physiological condition: Secondary | ICD-10-CM | POA: Diagnosis not present

## 2024-04-09 MED ORDER — MIRTAZAPINE 7.5 MG PO TABS: 7.5000 mg | ORAL_TABLET | Freq: Every day | ORAL | 1 refills | Status: DC

## 2024-04-09 NOTE — Patient Instructions (Addendum)
 Cancer Patient Support Program (CPSP) CPSP offers in-person, telephone and video visits for cancer survivors and loved ones impacted by cancer. For help with setting up an appointment, call [phone]   What are the benefits of a support group? Some patients and caregivers may prefer to meet with others who are walking a similar journey rather than meet individually with a mental health provider. We offer virtual support groups for both patients and caregivers. This is a unique opportunity to join with others across [place]  to discuss similar issues.  Being a part of a support group has many benefits! They include increased empowerment and hope; a reduction in anxiety, distress, and feelings of loneliness; new coping skills; improved communications with loved ones; and tips on navigating cancer care.  LGBTQ+ Cancer Support Group Join a safe and inclusive space where individuals with cancer can connect, share experiences, and build community.  Whether you are newly diagnosed or navigating treatment, our group is here to provide emotional support, resources, and a sense of community.  We recognize the unique challenges that LGBTQ+ individuals may encounter during their journey, and our hope is to offer a safe and empowering environment where you can express yourself openly.  To register or ask questions, contact Manuelita Leech, [facility] at [email or web address] or [phone].  ? ?

## 2024-04-10 ENCOUNTER — Other Ambulatory Visit: Payer: Self-pay

## 2024-04-10 ENCOUNTER — Other Ambulatory Visit: Payer: Self-pay | Admitting: Oncology

## 2024-04-13 ENCOUNTER — Ambulatory Visit (HOSPITAL_COMMUNITY): Admitting: Clinical

## 2024-04-14 ENCOUNTER — Other Ambulatory Visit: Payer: Self-pay | Admitting: *Deleted

## 2024-04-14 ENCOUNTER — Inpatient Hospital Stay (HOSPITAL_BASED_OUTPATIENT_CLINIC_OR_DEPARTMENT_OTHER): Admitting: Oncology

## 2024-04-14 ENCOUNTER — Inpatient Hospital Stay

## 2024-04-14 VITALS — BP 152/84 | HR 84 | Temp 98.1°F | Resp 18 | Ht 66.0 in | Wt 173.0 lb

## 2024-04-14 DIAGNOSIS — C251 Malignant neoplasm of body of pancreas: Secondary | ICD-10-CM

## 2024-04-14 DIAGNOSIS — Z95828 Presence of other vascular implants and grafts: Secondary | ICD-10-CM

## 2024-04-14 DIAGNOSIS — Z5111 Encounter for antineoplastic chemotherapy: Secondary | ICD-10-CM | POA: Diagnosis not present

## 2024-04-14 LAB — CMP (CANCER CENTER ONLY)
ALT: 15 U/L (ref 0–44)
AST: 21 U/L (ref 15–41)
Albumin: 3.7 g/dL (ref 3.5–5.0)
Alkaline Phosphatase: 94 U/L (ref 38–126)
Anion gap: 11 (ref 5–15)
BUN: 7 mg/dL (ref 6–20)
CO2: 24 mmol/L (ref 22–32)
Calcium: 9.5 mg/dL (ref 8.9–10.3)
Chloride: 107 mmol/L (ref 98–111)
Creatinine: 0.72 mg/dL (ref 0.44–1.00)
GFR, Estimated: >60 mL/min (ref >60)
Glucose, Bld: 182 mg/dL — ABNORMAL HIGH (ref 70–99)
Potassium: 3.7 mmol/L (ref 3.5–5.1)
Sodium: 143 mmol/L (ref 135–145)
Total Bilirubin: 0.4 mg/dL (ref 0.0–1.2)
Total Protein: 7 g/dL (ref 6.5–8.1)

## 2024-04-14 LAB — CBC WITH DIFFERENTIAL (CANCER CENTER ONLY)
Abs Immature Granulocytes: 0.05 K/uL (ref 0.00–0.07)
Basophils Absolute: 0 K/uL (ref 0.0–0.1)
Basophils Relative: 0 %
Eosinophils Absolute: 0.1 K/uL (ref 0.0–0.5)
Eosinophils Relative: 1 %
HCT: 35.2 % — ABNORMAL LOW (ref 36.0–46.0)
Hemoglobin: 11.3 g/dL — ABNORMAL LOW (ref 12.0–15.0)
Immature Granulocytes: 1 %
Lymphocytes Relative: 24 %
Lymphs Abs: 1.9 K/uL (ref 0.7–4.0)
MCH: 30 pg (ref 26.0–34.0)
MCHC: 32.1 g/dL (ref 30.0–36.0)
MCV: 93.4 fL (ref 80.0–100.0)
Monocytes Absolute: 0.6 K/uL (ref 0.1–1.0)
Monocytes Relative: 8 %
Neutro Abs: 5.1 K/uL (ref 1.7–7.7)
Neutrophils Relative %: 66 %
Platelet Count: 190 K/uL (ref 150–400)
RBC: 3.77 MIL/uL — ABNORMAL LOW (ref 3.87–5.11)
RDW: 18.1 % — ABNORMAL HIGH (ref 11.5–15.5)
WBC Count: 7.7 K/uL (ref 4.0–10.5)
nRBC: 0 % (ref 0.0–0.2)

## 2024-04-14 MED ORDER — HEPARIN SOD (PORK) LOCK FLUSH 100 UNIT/ML IV SOLN
500.0000 [IU] | Freq: Once | INTRAVENOUS | Status: AC
Start: 2024-04-14 — End: 2024-04-14
  Administered 2024-04-14: 500 [IU]

## 2024-04-14 MED ORDER — DEXAMETHASONE 4 MG PO TABS
4.0000 mg | ORAL_TABLET | Freq: Two times a day (BID) | ORAL | 0 refills | Status: DC
Start: 2024-04-14 — End: 2024-06-24

## 2024-04-14 MED ORDER — SODIUM CHLORIDE 0.9 % IV SOLN
1000.0000 mg/m2 | Freq: Once | INTRAVENOUS | Status: AC
  Administered 2024-04-14: 1900 mg via INTRAVENOUS
  Filled 2024-04-14: qty 49.97

## 2024-04-14 MED ORDER — PACLITAXEL PROTEIN-BOUND CHEMO INJECTION 100 MG
100.0000 mg/m2 | Freq: Once | INTRAVENOUS | Status: AC
  Administered 2024-04-14: 200 mg via INTRAVENOUS
  Filled 2024-04-14: qty 40

## 2024-04-14 MED ORDER — PROCHLORPERAZINE MALEATE 10 MG PO TABS: 10.0000 mg | ORAL_TABLET | Freq: Four times a day (QID) | ORAL | 1 refills | Status: DC | PRN

## 2024-04-14 MED ORDER — SODIUM CHLORIDE 0.9 % IV SOLN
Freq: Once | INTRAVENOUS | Status: AC
Start: 2024-04-14 — End: 2024-04-14

## 2024-04-14 MED ORDER — SODIUM CHLORIDE 0.9% FLUSH
10.0000 mL | Freq: Once | INTRAVENOUS | Status: AC
Start: 2024-04-14 — End: 2024-04-14
  Administered 2024-04-14: 10 mL

## 2024-04-14 MED ORDER — PALONOSETRON HCL INJECTION 0.25 MG/5ML
0.2500 mg | Freq: Once | INTRAVENOUS | Status: AC
  Administered 2024-04-14: 0.25 mg via INTRAVENOUS
  Filled 2024-04-14: qty 5

## 2024-04-14 MED ORDER — SODIUM CHLORIDE 0.9% FLUSH
10.0000 mL | Freq: Once | INTRAVENOUS | Status: AC
  Administered 2024-04-14: 10 mL via INTRAVENOUS

## 2024-04-14 MED ORDER — DEXAMETHASONE SODIUM PHOSPHATE 10 MG/ML IJ SOLN
10.0000 mg | Freq: Once | INTRAMUSCULAR | Status: AC
  Administered 2024-04-14: 10 mg via INTRAVENOUS
  Filled 2024-04-14: qty 1

## 2024-04-14 NOTE — Patient Instructions (Signed)
 [facility] - A DEPT OF [place]  Discharge Instructions: Thank you for choosing [facility] to provide your oncology and hematology care.   If you have a lab appointment with the Cancer Center, please go directly to the Cancer Center and check in at the registration area.   Wear comfortable clothing and clothing appropriate for easy access to any Portacath or PICC line.   We strive to give you quality time with your provider. You may need to reschedule your appointment if you arrive late (15 or more minutes).  Arriving late affects you and other patients whose appointments are after yours.  Also, if you miss three or more appointments without notifying the office, you may be dismissed from the clinic at the provider's discretion.      For prescription refill requests, have your pharmacy contact our office and allow 72 hours for refills to be completed.    Today you received the following chemotherapy and/or immunotherapy agents abraxane  and gemzar   Gemcitabine  Injection What is this medication? GEMCITABINE  (jem SYE ta been) treats some types of cancer. It works by slowing down the growth of cancer cells. This medicine may be used for other purposes; ask your health care provider or pharmacist if you have questions. COMMON BRAND NAME(S): Gemzar , Infugem  What should I tell my care team before I take this medication? They need to know if you have any of these conditions: Blood disorders Infection Kidney disease Liver disease Lung or breathing disease, such as asthma or COPD Recent or ongoing radiation therapy An unusual or allergic reaction to gemcitabine , other medications, foods, dyes, or preservatives If you or your partner are pregnant or trying to get pregnant Breast-feeding How should I use this medication? This medication is injected into a vein. It is given by your care team in a hospital or clinic setting. Talk to your care team  about the use of this medication in children. Special care may be needed. Overdosage: If you think you have taken too much of this medicine contact a poison control center or emergency room at once. NOTE: This medicine is only for you. Do not share this medicine with others. What if I miss a dose? Keep appointments for follow-up doses. It is important not to miss your dose. Call your care team if you are unable to keep an appointment. What may interact with this medication? Interactions have not been studied. This list may not describe all possible interactions. Give your health care provider a list of all the medicines, herbs, non-prescription drugs, or dietary supplements you use. Also tell them if you smoke, drink alcohol, or use illegal drugs. Some items may interact with your medicine. What should I watch for while using this medication? Your condition will be monitored carefully while you are receiving this medication. This medication may make you feel generally unwell. This is not uncommon, as chemotherapy can affect healthy cells as well as cancer cells. Report any side effects. Continue your course of treatment even though you feel ill unless your care team tells you to stop. In some cases, you may be given additional medications to help with side effects. Follow all directions for their use. This medication may increase your risk of getting an infection. Call your care team for advice if you get a fever, chills, sore throat, or other symptoms of a cold or flu. Do not treat yourself. Try to avoid being around people who are sick. This medication  may increase your risk to bruise or bleed. Call your care team if you notice any unusual bleeding. Be careful brushing or flossing your teeth or using a toothpick because you may get an infection or bleed more easily. If you have any dental work done, tell your dentist you are receiving this medication. Avoid taking medications that contain aspirin ,  acetaminophen , ibuprofen, naproxen , or ketoprofen unless instructed by your care team. These medications may hide a fever. Talk to your care team if you or your partner wish to become pregnant or think you might be pregnant. This medication can cause serious birth defects if taken during pregnancy and for 6 months after the last dose. A negative pregnancy test is required before starting this medication. A reliable form of contraception is recommended while taking this medication and for 6 months after the last dose. Talk to your care team about effective forms of contraception. Do not father a child while taking this medication and for 3 months after the last dose. Use a condom while having sex during this time period. Do not breastfeed while taking this medication and for at least 1 week after the last dose. This medication may cause infertility. Talk to your care team if you are concerned about your fertility. What side effects may I notice from receiving this medication? Side effects that you should report to your care team as soon as possible: Allergic reactions--skin rash, itching, hives, swelling of the face, lips, tongue, or throat Capillary leak syndrome--stomach or muscle pain, unusual weakness or fatigue, feeling faint or lightheaded, decrease in the amount of urine, swelling of the ankles, hands, or feet, trouble breathing Infection--fever, chills, cough, sore throat, wounds that don't heal, pain or trouble when passing urine, general feeling of discomfort or being unwell Liver injury--right upper belly pain, loss of appetite, nausea, light-colored stool, dark yellow or brown urine, yellowing skin or eyes, unusual weakness or fatigue Low red blood cell level--unusual weakness or fatigue, dizziness, headache, trouble breathing Lung injury--shortness of breath or trouble breathing, cough, spitting up blood, chest pain, fever Stomach pain, bloody diarrhea, pale skin, unusual weakness or fatigue,  decrease in the amount of urine, which may be signs of hemolytic uremic syndrome Sudden and severe headache, confusion, change in vision, seizures, which may be signs of posterior reversible encephalopathy syndrome (PRES) Unusual bruising or bleeding Side effects that usually do not require medical attention (report to your care team if they continue or are bothersome): Diarrhea Drowsiness Hair loss Nausea Pain, redness, or swelling with sores inside the mouth or throat Vomiting This list may not describe all possible side effects. Call your doctor for medical advice about side effects. You may report side effects to FDA at 1-800-FDA-1088. Where should I keep my medication? This medication is given in a hospital or clinic. It will not be stored at home. NOTE: This sheet is a summary. It may not cover all possible information. If you have questions about this medicine, talk to your doctor, pharmacist, or health care provider.  2024 Elsevier/Gold Standard (2022-01-09 00:00:00)  Paclitaxel  Nanoparticle Albumin-Bound Injection What is this medication? NANOPARTICLE ALBUMIN-BOUND PACLITAXEL  (Na no PAHR ti kuhl al BYOO muhn-bound PAK li TAX el) treats some types of cancer. It works by slowing down the growth of cancer cells. This medicine may be used for other purposes; ask your health care provider or pharmacist if you have questions. COMMON BRAND NAME(S): Abraxane  What should I tell my care team before I take this medication? They need  to know if you have any of these conditions: Liver disease Low white blood cell levels An unusual or allergic reaction to paclitaxel , albumin, other medications, foods, dyes, or preservatives If you or your partner are pregnant or trying to get pregnant Breast-feeding How should I use this medication? This medication is injected into a vein. It is given by your care team in a hospital or clinic setting. Talk to your care team about the use of this medication  in children. Special care may be needed. Overdosage: If you think you have taken too much of this medicine contact a poison control center or emergency room at once. NOTE: This medicine is only for you. Do not share this medicine with others. What if I miss a dose? Keep appointments for follow-up doses. It is important not to miss your dose. Call your care team if you are unable to keep an appointment. What may interact with this medication? Other medications may affect the way this medication works. Talk with your care team about all of the medications you take. They may suggest changes to your treatment plan to lower the risk of side effects and to make sure your medications work as intended. This list may not describe all possible interactions. Give your health care provider a list of all the medicines, herbs, non-prescription drugs, or dietary supplements you use. Also tell them if you smoke, drink alcohol, or use illegal drugs. Some items may interact with your medicine. What should I watch for while using this medication? Your condition will be monitored carefully while you are receiving this medication. You may need blood work while taking this medication. This medication may make you feel generally unwell. This is not uncommon as chemotherapy can affect healthy cells as well as cancer cells. Report any side effects. Continue your course of treatment even though you feel ill unless your care team tells you to stop. This medication can cause serious allergic reactions. To reduce the risk, your care team may give you other medications to take before receiving this one. Be sure to follow the directions from your care team. This medication may increase your risk of getting an infection. Call your care team for advice if you get a fever, chills, sore throat, or other symptoms of a cold or flu. Do not treat yourself. Try to avoid being around people who are sick. This medication may increase your risk  to bruise or bleed. Call your care team if you notice any unusual bleeding. Be careful brushing or flossing your teeth or using a toothpick because you may get an infection or bleed more easily. If you have any dental work done, tell your dentist you are receiving this medication. Talk to your care team if you or your partner may be pregnant. Serious birth defects can occur if you take this medication during pregnancy and for 6 months after the last dose. You will need a negative pregnancy test before starting this medication. Contraception is recommended while taking this medication and for 6 months after the last dose. Your care team can help you find the option that works for you. If your partner can get pregnant, use a condom during sex while taking this medication and for 3 months after the last dose. Do not breastfeed while taking this medication and for 2 weeks after the last dose. This medication may cause infertility. Talk to your care team if you are concerned about your fertility. What side effects may I notice from receiving this  medication? Side effects that you should report to your care team as soon as possible: Allergic reactions--skin rash, itching, hives, swelling of the face, lips, tongue, or throat Dry cough, shortness of breath or trouble breathing Infection--fever, chills, cough, sore throat, wounds that don't heal, pain or trouble when passing urine, general feeling of discomfort or being unwell Low red blood cell level--unusual weakness or fatigue, dizziness, headache, trouble breathing Pain, tingling, or numbness in the hands or feet Stomach pain, unusual weakness or fatigue, nausea, vomiting, diarrhea, or fever that lasts longer than expected Unusual bruising or bleeding Side effects that usually do not require medical attention (report to your care team if they continue or are bothersome): Diarrhea Fatigue Hair loss Loss of appetite Nausea Vomiting This list may not  describe all possible side effects. Call your doctor for medical advice about side effects. You may report side effects to FDA at 1-800-FDA-1088. Where should I keep my medication? This medication is given in a hospital or clinic. It will not be stored at home. NOTE: This sheet is a summary. It may not cover all possible information. If you have questions about this medicine, talk to your doctor, pharmacist, or health care provider.  2024 Elsevier/Gold Standard (2022-01-18 00:00:00)      To help prevent nausea and vomiting after your treatment, we encourage you to take your nausea medication as directed.  BELOW ARE SYMPTOMS THAT SHOULD BE REPORTED IMMEDIATELY: *FEVER GREATER THAN 100.4 F (38 C) OR HIGHER *CHILLS OR SWEATING *NAUSEA AND VOMITING THAT IS NOT CONTROLLED WITH YOUR NAUSEA MEDICATION *UNUSUAL SHORTNESS OF BREATH *UNUSUAL BRUISING OR BLEEDING *URINARY PROBLEMS (pain or burning when urinating, or frequent urination) *BOWEL PROBLEMS (unusual diarrhea, constipation, pain near the anus) TENDERNESS IN MOUTH AND THROAT WITH OR WITHOUT PRESENCE OF ULCERS (sore throat, sores in mouth, or a toothache) UNUSUAL RASH, SWELLING OR PAIN  UNUSUAL VAGINAL DISCHARGE OR ITCHING   Items with * indicate a potential emergency and should be followed up as soon as possible or go to the Emergency Department if any problems should occur.  Please show the CHEMOTHERAPY ALERT CARD or IMMUNOTHERAPY ALERT CARD at check-in to the Emergency Department and triage nurse.  Should you have questions after your visit or need to cancel or reschedule your appointment, please contact [facility] - A DEPT OF [facility]  Dept: [phone]  and follow the prompts.  Office hours are 8:00 a.m. to 4:30 p.m. Monday - Friday. Please note that voicemails left after 4:00 p.m. may not be returned until the following business day.  We are closed weekends and major holidays. You have access to a  nurse at all times for urgent questions. Please call the main number to the clinic Dept: [phone] and follow the prompts.   For any non-urgent questions, you may also contact your provider using MyChart. We now offer e-Visits for anyone 56 and older to request care online for non-urgent symptoms. For details visit [email or web address].   Also download the MyChart app! Go to the app store, search MyChart, open the app, select [place], and log in with your MyChart username and password.

## 2024-04-14 NOTE — Progress Notes (Signed)
 [facility] OFFICE PROGRESS NOTE   Diagnosis: Pancreas cancer  INTERVAL HISTORY:   Kathleen Stone completed another cycle of gemcitabine /Abraxane  on 04/01/2024.  No fever, rash, or neuropathy symptoms.  She complains of nausea and vomiting for 1 week following chemotherapy.  The nausea began on day 2.  Zofran  and Compazine  helped.  She has diarrhea after eating.  She is not taking Creon .  No abdominal pain.  Objective:  Vital signs in last 24 hours:  Blood pressure (!) 152/84, pulse 84, temperature 98.1 F (36.7 C), temperature source Temporal, resp. rate 18, height 5' 6 (1.676 m), weight 173 lb (78.5 kg), last menstrual period 02/07/2016, SpO2 100%.    HEENT: No thrush or ulcers Resp: Lungs clear bilaterally Cardio: Regular rate and rhythm GI: No hepatosplenomegaly, nontender, no mass Vascular: No leg edema   Portacath/PICC-without erythema  Lab Results:  Lab Results  Component Value Date   WBC 7.7 04/14/2024   HGB 11.3 (L) 04/14/2024   HCT 35.2 (L) 04/14/2024   MCV 93.4 04/14/2024   PLT 190 04/14/2024   NEUTROABS 5.1 04/14/2024    CMP  Lab Results  Component Value Date   NA 143 04/14/2024   K 3.7 04/14/2024   CL 107 04/14/2024   CO2 24 04/14/2024   GLUCOSE 182 (H) 04/14/2024   BUN 7 04/14/2024   CREATININE 0.72 04/14/2024   CALCIUM  9.5 04/14/2024   PROT 7.0 04/14/2024   ALBUMIN 3.7 04/14/2024   AST 21 04/14/2024   ALT 15 04/14/2024   ALKPHOS 94 04/14/2024   BILITOT 0.4 04/14/2024   GFRNONAA >60 04/14/2024   GFRAA >60 02/29/2020    Lab Results  Component Value Date   CEA1 27.4 (H) 07/31/2022   RJW800 816 (H) 04/01/2024    Lab Results  Component Value Date   INR 1.0 07/31/2022   LABPROT 13.1 07/31/2022    Imaging:  No results found.  Medications: I have reviewed the patient's current medications.   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 CT abdomen/pelvis 07/30/2022-body/neck pancreas mass with peripheral pancreatic atrophy  and duct dilation, suspected encasement of the celiac axis, uterine fibroids MRI abdomen 07/30/2022-3.7 x 4.7 cm pancreas body/tail mass with encasement of the proximal celiac artery, occlusion with collateralization at the confluence of the SMV and portal vein, no evidence of metastatic disease CT chest 08/01/2022-no intrathoracic metastases Elevated CA 19-9 EUS 08/02/2022-gastric polyp, gastric erosions, 32 x 33 mm pancreas body mass, invasion of the celiac trunk, invasion of the splenoportal confluence, no SMA invasion, 2 malignant appearing nodes in the porta hepatis,T4N1 by endoscopic criteria, FNA biopsy-adenocarcinoma 08/17/2022 Guardant360-tumor mutation burden 0.96; MSI high not detected; 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  Stable porta hepatis lymph nodes. Cycle 7 gemcitabine /Abraxane  11/09/2022 Cycle 8 gemcitabine /Abraxane  11/23/2022 Cycle 9 gemcitabine /Abraxane  12/07/2022 Cycle 10 gemcitabine /Abraxane  12/21/2022 Cycle 11 gemcitabine /Abraxane  01/04/2023 Cycle 12 gemcitabine /Abraxane  01/18/2023 Cycle 13 gemcitabine /Abraxane  02/01/2023 Cycle 14 gemcitabine /Abraxane  02/15/2023 CT abdomen/pelvis 02/26/2023-stable appearance of pancreas body mass with vascular encasement and chronic occlusion of the splenic vein, no evidence of metastatic disease Cycle 15 gemcitabine /Abraxane  03/01/2023 Cycle 16 gemcitabine /Abraxane  03/15/2023 Cycle 17 Gemcitabine /Abraxane  03/29/2023 EUS pancreas fiducial marker placement 04/17/2023 Pancreas SBRT 05/07/2023 - 05/17/2023, 33 Gray in 5 fractions CT abdomen/pelvis  09/14/2023:  Slight decrease in pancreas body mass with persistent vascular encasement, no evidence of metastatic disease CTs [facility] 01/02/2024-ill-defined hypoattenuating mass in the mid pancreatic body surrounding surgical clips.  Pancreatic duct within the distal pancreatic body and tail is dilated.  Mass encases the celiac trunk.  Common bile duct is dilated with suggestion of focal stenosis in the mid bile duct.  High-grade stenosis/occlusion of the portal vein.  Prominent vessels in the upper abdomen.  Heterogeneous appearance of the splenic parenchyma with suggestion of an infiltrating hypoattenuating mass/masses versus sequela of splenic infarcts.  Multiple pulmonary nodules in the lung bases concerning for metastatic disease. CTs chest/abdomen/pelvis 01/06/2024-no PE.  Cardiomegaly with small pericardial effusion.  Small irregular focus of density medial right lung base.  Poorly defined pancreatic body mass measuring 3 x 2.2 cm, previously 3 x 2.5 cm, possible extension of infiltrative soft tissue towards the pancreatic neck region with ill-defined density at the porta hepatis compared to prior.  Slight increased extrahepatic biliary and common duct dilatation compared to prior.  No focal splenic abnormality. Cycle 1 gemcitabine /abraxane  02/07/2024 Cycle 2 gemcitabine /Abraxane  03/18/2024 Cycle 3 gemcitabine /Abraxane  04/01/2024, premed changed from Compazine  to Zofran  Cycle 4 gemcitabine /Abraxane  04/14/2024, premedication changed to Aloxi /Decadron      2.  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  Similar size of of enhancing mass in the dorsal right midbrain effacing the cerebral aqueduct.  No new enhancing lesions identified.  She was transferred to [facility].  She underwent an endoscopic third ventriculostomy on 02/12/2024.   Brain CT  03/13/2024-interval resolution of obstructive hydrocephalus and transependymal edema.  Mild interval decrease in size of a mass within the right midbrain, compatible with glioma.  Chronic encephalomalacia changes in the right frontal lobe. 02/12/2024: Endoscopic ventriculostomy at [facility]   3.  Anorexia/weight loss secondary to #1 4.  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  Abdominal pain 6.  History of colon polyps 7.  Family history of multiple cancers Pancreas cancer maternal aunt, maternal cousin, maternal grandfather Breast cancer-maternal aunt, maternal cousin Prostate cancer-maternal uncle Lung cancer-father 8.  Hypertension 9.  Postmenopausal vaginal bleeding 10.  H. pylori 08/02/2022-amoxicillin  and Flagyl  discontinued 08/21/2022 due to GI symptoms 11.  Syncope event 03/07/2023 12.  Eliquis  initiated 01/02/2024 for high-grade stenosis/occlusion of the portal vein      Disposition: Kathleen Stone has pancreas cancer.  There was clinical evidence of disease progression earlier this year.  She has completed 3 cycles of gemcitabine /Abraxane .  The CA 19-9 was stable 2 weeks ago.  We will follow-up on the CA 19-9 from today.  She has delayed nausea following chemotherapy.  We adjusted the antiemetic premedication today.  She will contact us  with nausea following this cycle of chemotherapy.  I recommended she resume Creon .  Kathleen Stone will return for an office visit and chemotherapy in 2 weeks.  We will plan for a restaging CT evaluation after 5 or 6 cycles of gemcitabine /Abraxane .  Arley Hof, MD  04/14/2024  12:14 PM

## 2024-04-14 NOTE — Progress Notes (Signed)
 Patient seen by Dr. Arley Hof today  Vitals are within treatment parameters:Yes OK to proceed w/BP 152/84  Labs are within treatment parameters: Yes   Treatment plan has been signed: Yes   Per physician team, patient is ready for treatment. Premeds changed to decadron  and aloxi  due to delayed nausea.

## 2024-04-14 NOTE — Patient Instructions (Signed)

## 2024-04-15 LAB — CANCER ANTIGEN 19-9: CA 19-9: 687 U/mL — ABNORMAL HIGH (ref 0–35)

## 2024-04-23 ENCOUNTER — Other Ambulatory Visit: Payer: Self-pay | Admitting: Oncology

## 2024-04-29 ENCOUNTER — Encounter: Payer: Self-pay | Admitting: Nurse Practitioner

## 2024-04-29 ENCOUNTER — Inpatient Hospital Stay

## 2024-04-29 ENCOUNTER — Inpatient Hospital Stay (HOSPITAL_BASED_OUTPATIENT_CLINIC_OR_DEPARTMENT_OTHER): Admitting: Nurse Practitioner

## 2024-04-29 ENCOUNTER — Inpatient Hospital Stay: Attending: Oncology

## 2024-04-29 VITALS — BP 139/73 | HR 96 | Temp 97.8°F | Resp 18 | Ht 66.0 in | Wt 176.6 lb

## 2024-04-29 DIAGNOSIS — Z860100 Personal history of colon polyps, unspecified: Secondary | ICD-10-CM | POA: Insufficient documentation

## 2024-04-29 DIAGNOSIS — C251 Malignant neoplasm of body of pancreas: Secondary | ICD-10-CM

## 2024-04-29 DIAGNOSIS — Z85841 Personal history of malignant neoplasm of brain: Secondary | ICD-10-CM | POA: Insufficient documentation

## 2024-04-29 DIAGNOSIS — I81 Portal vein thrombosis: Secondary | ICD-10-CM | POA: Diagnosis not present

## 2024-04-29 DIAGNOSIS — Z7901 Long term (current) use of anticoagulants: Secondary | ICD-10-CM | POA: Diagnosis not present

## 2024-04-29 DIAGNOSIS — Z803 Family history of malignant neoplasm of breast: Secondary | ICD-10-CM | POA: Insufficient documentation

## 2024-04-29 DIAGNOSIS — Z5111 Encounter for antineoplastic chemotherapy: Secondary | ICD-10-CM | POA: Diagnosis present

## 2024-04-29 DIAGNOSIS — Z801 Family history of malignant neoplasm of trachea, bronchus and lung: Secondary | ICD-10-CM | POA: Diagnosis not present

## 2024-04-29 DIAGNOSIS — I10 Essential (primary) hypertension: Secondary | ICD-10-CM | POA: Insufficient documentation

## 2024-04-29 LAB — CMP (CANCER CENTER ONLY)
ALT: 15 U/L (ref 0–44)
AST: 22 U/L (ref 15–41)
Albumin: 3.6 g/dL (ref 3.5–5.0)
Alkaline Phosphatase: 94 U/L (ref 38–126)
Anion gap: 14 (ref 5–15)
BUN: 11 mg/dL (ref 6–20)
CO2: 23 mmol/L (ref 22–32)
Calcium: 9.7 mg/dL (ref 8.9–10.3)
Chloride: 105 mmol/L (ref 98–111)
Creatinine: 0.74 mg/dL (ref 0.44–1.00)
GFR, Estimated: >60 mL/min (ref >60)
Glucose, Bld: 199 mg/dL — ABNORMAL HIGH (ref 70–99)
Potassium: 3.8 mmol/L (ref 3.5–5.1)
Sodium: 141 mmol/L (ref 135–145)
Total Bilirubin: 0.5 mg/dL (ref 0.0–1.2)
Total Protein: 6.7 g/dL (ref 6.5–8.1)

## 2024-04-29 LAB — CBC WITH DIFFERENTIAL (CANCER CENTER ONLY)
Abs Immature Granulocytes: 0.05 K/uL (ref 0.00–0.07)
Basophils Absolute: 0 K/uL (ref 0.0–0.1)
Basophils Relative: 0 %
Eosinophils Absolute: 0.1 K/uL (ref 0.0–0.5)
Eosinophils Relative: 1 %
HCT: 34.9 % — ABNORMAL LOW (ref 36.0–46.0)
Hemoglobin: 11.5 g/dL — ABNORMAL LOW (ref 12.0–15.0)
Immature Granulocytes: 1 %
Lymphocytes Relative: 20 %
Lymphs Abs: 1.5 K/uL (ref 0.7–4.0)
MCH: 30.7 pg (ref 26.0–34.0)
MCHC: 33 g/dL (ref 30.0–36.0)
MCV: 93.1 fL (ref 80.0–100.0)
Monocytes Absolute: 0.6 K/uL (ref 0.1–1.0)
Monocytes Relative: 7 %
Neutro Abs: 5.6 K/uL (ref 1.7–7.7)
Neutrophils Relative %: 71 %
Platelet Count: 150 K/uL (ref 150–400)
RBC: 3.75 MIL/uL — ABNORMAL LOW (ref 3.87–5.11)
RDW: 19.7 % — ABNORMAL HIGH (ref 11.5–15.5)
WBC Count: 7.8 K/uL (ref 4.0–10.5)
nRBC: 0 % (ref 0.0–0.2)

## 2024-04-29 MED ORDER — POTASSIUM CHLORIDE CRYS ER 20 MEQ PO TBCR: 20.0000 meq | EXTENDED_RELEASE_TABLET | Freq: Every day | ORAL | 2 refills | Status: DC

## 2024-04-29 MED ORDER — PALONOSETRON HCL INJECTION 0.25 MG/5ML
0.2500 mg | Freq: Once | INTRAVENOUS | Status: AC
  Administered 2024-04-29 (×2): 0.25 mg via INTRAVENOUS
  Filled 2024-04-29: qty 5

## 2024-04-29 MED ORDER — PACLITAXEL PROTEIN-BOUND CHEMO INJECTION 100 MG
100.0000 mg/m2 | Freq: Once | INTRAVENOUS | Status: AC
  Administered 2024-04-29 (×2): 200 mg via INTRAVENOUS
  Filled 2024-04-29: qty 40

## 2024-04-29 MED ORDER — SODIUM CHLORIDE 0.9 % IV SOLN: Freq: Once | INTRAVENOUS | Status: AC

## 2024-04-29 MED ORDER — SODIUM CHLORIDE 0.9 % IV SOLN
1000.0000 mg/m2 | Freq: Once | INTRAVENOUS | Status: AC
  Administered 2024-04-29 (×2): 1900 mg via INTRAVENOUS
  Filled 2024-04-29: qty 49.97

## 2024-04-29 MED ORDER — DEXAMETHASONE SODIUM PHOSPHATE 10 MG/ML IJ SOLN
10.0000 mg | Freq: Once | INTRAMUSCULAR | Status: AC
  Administered 2024-04-29 (×2): 10 mg via INTRAVENOUS
  Filled 2024-04-29: qty 1

## 2024-04-29 NOTE — Progress Notes (Signed)
 Patient seen by Olam Ned NP today  Vitals are within treatment parameters:Yes   Labs are within treatment parameters: Yes   Treatment plan has been signed: Yes   Per physician team, Patient is ready for treatment and there are NO modifications to the treatment plan.

## 2024-04-29 NOTE — Progress Notes (Signed)
 [facility] OFFICE PROGRESS NOTE   Diagnosis: Pancreas cancer  INTERVAL HISTORY:   Kathleen Stone returns as scheduled.  She completed another cycle of gemcitabine /Abraxane  04/14/2024.  She had no nausea or vomiting.  No mouth sores.  No change in baseline bowel habits.  She tends to have a bowel movement after drinking a shake.  She has not resumed Creon .  No fever or rash after treatment.  No shortness of breath.  She denies abdominal pain.  She has mild intermittent tingling in the feet.  Objective:  Vital signs in last 24 hours:  Blood pressure 139/73, pulse 96, temperature 97.8 F (36.6 C), temperature source Temporal, resp. rate 18, height 5' 6 (1.676 m), weight 176 lb 9.6 oz (80.1 kg), last menstrual period 02/07/2016, SpO2 100%.    HEENT: No thrush or ulcers. Resp: Lungs clear bilaterally Cardio: Regular rate and rhythm. GI: Tender.  No hepatosplenomegaly.  No mass.  Nontender. Vascular: No leg edema. Skin: No rash. Port-A-Cath without erythema.  Lab Results:  Lab Results  Component Value Date   WBC 7.8 04/29/2024   HGB 11.5 (L) 04/29/2024   HCT 34.9 (L) 04/29/2024   MCV 93.1 04/29/2024   PLT 150 04/29/2024   NEUTROABS 5.6 04/29/2024    Imaging:  No results found.  Medications: I have reviewed the patient's current medications.  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 CT abdomen/pelvis 07/30/2022-body/neck pancreas mass with peripheral pancreatic atrophy and duct dilation, suspected encasement of the celiac axis, uterine fibroids MRI abdomen 07/30/2022-3.7 x 4.7 cm pancreas body/tail mass with encasement of the proximal celiac artery, occlusion with collateralization at the confluence of the SMV and portal vein, no evidence of metastatic disease CT chest 08/01/2022-no intrathoracic metastases Elevated CA 19-9 EUS 08/02/2022-gastric polyp, gastric erosions, 32 x 33 mm pancreas body mass, invasion of the celiac trunk, invasion of the splenoportal  confluence, no SMA invasion, 2 malignant appearing nodes in the porta hepatis,T4N1 by endoscopic criteria, FNA biopsy-adenocarcinoma 08/17/2022 Guardant360-tumor mutation burden 0.96; MSI high not detected; 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  Stable porta hepatis lymph nodes. Cycle 7 gemcitabine /Abraxane  11/09/2022 Cycle 8 gemcitabine /Abraxane  11/23/2022 Cycle 9 gemcitabine /Abraxane  12/07/2022 Cycle 10 gemcitabine /Abraxane  12/21/2022 Cycle 11 gemcitabine /Abraxane  01/04/2023 Cycle 12 gemcitabine /Abraxane  01/18/2023 Cycle 13 gemcitabine /Abraxane  02/01/2023 Cycle 14 gemcitabine /Abraxane  02/15/2023 CT abdomen/pelvis 02/26/2023-stable appearance of pancreas body mass with vascular encasement and chronic occlusion of the splenic vein, no evidence of metastatic disease Cycle 15 gemcitabine /Abraxane  03/01/2023 Cycle 16 gemcitabine /Abraxane  03/15/2023 Cycle 17 Gemcitabine /Abraxane  03/29/2023 EUS pancreas fiducial marker placement 04/17/2023 Pancreas SBRT 05/07/2023 - 05/17/2023, 33 Gray in 5 fractions CT abdomen/pelvis 09/14/2023: Slight decrease in pancreas body mass with persistent vascular encasement, no evidence of metastatic disease CTs [facility] 01/02/2024-ill-defined hypoattenuating mass in the mid pancreatic body surrounding surgical clips.  Pancreatic duct within the distal pancreatic body and tail is dilated.  Mass encases the celiac trunk.  Common bile duct is dilated with suggestion of focal stenosis in the mid bile duct.  High-grade stenosis/occlusion of the portal vein.  Prominent vessels  in the upper abdomen.  Heterogeneous appearance of the splenic parenchyma with suggestion of an infiltrating hypoattenuating mass/masses versus sequela of splenic infarcts.  Multiple pulmonary nodules in the lung bases concerning  for metastatic disease. CTs chest/abdomen/pelvis 01/06/2024-no PE.  Cardiomegaly with small pericardial effusion.  Small irregular focus of density medial right lung base.  Poorly defined pancreatic body mass measuring 3 x 2.2 cm, previously 3 x 2.5 cm, possible extension of infiltrative soft tissue towards the pancreatic neck region with ill-defined density at the porta hepatis compared to prior.  Slight increased extrahepatic biliary and common duct dilatation compared to prior.  No focal splenic abnormality. Cycle 1 gemcitabine /abraxane  02/07/2024 Cycle 2 gemcitabine /Abraxane  03/18/2024 Cycle 3 gemcitabine /Abraxane  04/01/2024, premed changed from Compazine  to Zofran  Cycle 4 gemcitabine /Abraxane  04/14/2024, premedication changed to Aloxi /Decadron  Cycle 5 gemcitabine /Abraxane  04/29/2024, premedication continued Aloxi /Decadron      2.  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  Similar size of of enhancing mass in the dorsal right midbrain effacing the cerebral aqueduct.  No new enhancing lesions identified.  She was transferred to [facility].  She underwent an endoscopic third ventriculostomy on 02/12/2024.   Brain CT 03/13/2024-interval resolution of obstructive hydrocephalus and transependymal edema.  Mild interval decrease in size of a mass within the right midbrain, compatible with glioma.  Chronic encephalomalacia changes in the right frontal lobe. 02/12/2024: [facility]oscopic [facility]ul[facility] at [facility]   3.  Anorexia/weight loss secondary to #1 4.  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  Abdominal pain 6.  History of colon polyps 7.   Family history of multiple cancers Pancreas cancer maternal aunt, maternal cousin, maternal grandfather Breast cancer-maternal aunt, maternal cousin Prostate cancer-maternal uncle Lung cancer-father 8.  Hypertension 9.  Postmenopausal vaginal bleeding 10.  H. pylori 08/02/2022-amoxicillin  and Flagyl  discontinued 08/21/2022 due to GI symptoms 11.  Syncope event 03/07/2023 12.  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  She has completed 4 cycles of gemcitabine /Abraxane .  She noted improvement in nausea with the premedication adjustment last treatment.  Plan to continue the same, cycle 5 today.  No clinical evidence of disease progression.  CA 19-9 was lower 2 weeks ago.  Restaging CTs after cycle 6.  CBC and chemistry panel reviewed.  Labs adequate for treatment.  She will return for follow-up and treatment in 2 weeks.  We are available to see her sooner if needed.    Olam Ned ANP/GNP-BC   04/29/2024  10:42 AM

## 2024-04-29 NOTE — Patient Instructions (Signed)
 [facility] - A DEPT OF [place]  Discharge Instructions: Thank you for choosing [facility] to provide your oncology and hematology care.   If you have a lab appointment with the Cancer Center, please go directly to the Cancer Center and check in at the registration area.   Wear comfortable clothing and clothing appropriate for easy access to any Portacath or PICC line.   We strive to give you quality time with your provider. You may need to reschedule your appointment if you arrive late (15 or more minutes).  Arriving late affects you and other patients whose appointments are after yours.  Also, if you miss three or more appointments without notifying the office, you may be dismissed from the clinic at the provider's discretion.      For prescription refill requests, have your pharmacy contact our office and allow 72 hours for refills to be completed.    Today you received the following chemotherapy and/or immunotherapy agents abraxane  and gemzar   Gemcitabine  Injection What is this medication? GEMCITABINE  (jem SYE ta been) treats some types of cancer. It works by slowing down the growth of cancer cells. This medicine may be used for other purposes; ask your health care provider or pharmacist if you have questions. COMMON BRAND NAME(S): Gemzar , Infugem  What should I tell my care team before I take this medication? They need to know if you have any of these conditions: Blood disorders Infection Kidney disease Liver disease Lung or breathing disease, such as asthma or COPD Recent or ongoing radiation therapy An unusual or allergic reaction to gemcitabine , other medications, foods, dyes, or preservatives If you or your partner are pregnant or trying to get pregnant Breast-feeding How should I use this medication? This medication is injected into a vein. It is given by your care team in a hospital or clinic setting. Talk to your care team  about the use of this medication in children. Special care may be needed. Overdosage: If you think you have taken too much of this medicine contact a poison control center or emergency room at once. NOTE: This medicine is only for you. Do not share this medicine with others. What if I miss a dose? Keep appointments for follow-up doses. It is important not to miss your dose. Call your care team if you are unable to keep an appointment. What may interact with this medication? Interactions have not been studied. This list may not describe all possible interactions. Give your health care provider a list of all the medicines, herbs, non-prescription drugs, or dietary supplements you use. Also tell them if you smoke, drink alcohol, or use illegal drugs. Some items may interact with your medicine. What should I watch for while using this medication? Your condition will be monitored carefully while you are receiving this medication. This medication may make you feel generally unwell. This is not uncommon, as chemotherapy can affect healthy cells as well as cancer cells. Report any side effects. Continue your course of treatment even though you feel ill unless your care team tells you to stop. In some cases, you may be given additional medications to help with side effects. Follow all directions for their use. This medication may increase your risk of getting an infection. Call your care team for advice if you get a fever, chills, sore throat, or other symptoms of a cold or flu. Do not treat yourself. Try to avoid being around people who are sick. This medication  may increase your risk to bruise or bleed. Call your care team if you notice any unusual bleeding. Be careful brushing or flossing your teeth or using a toothpick because you may get an infection or bleed more easily. If you have any dental work done, tell your dentist you are receiving this medication. Avoid taking medications that contain aspirin ,  acetaminophen , ibuprofen, naproxen , or ketoprofen unless instructed by your care team. These medications may hide a fever. Talk to your care team if you or your partner wish to become pregnant or think you might be pregnant. This medication can cause serious birth defects if taken during pregnancy and for 6 months after the last dose. A negative pregnancy test is required before starting this medication. A reliable form of contraception is recommended while taking this medication and for 6 months after the last dose. Talk to your care team about effective forms of contraception. Do not father a child while taking this medication and for 3 months after the last dose. Use a condom while having sex during this time period. Do not breastfeed while taking this medication and for at least 1 week after the last dose. This medication may cause infertility. Talk to your care team if you are concerned about your fertility. What side effects may I notice from receiving this medication? Side effects that you should report to your care team as soon as possible: Allergic reactions--skin rash, itching, hives, swelling of the face, lips, tongue, or throat Capillary leak syndrome--stomach or muscle pain, unusual weakness or fatigue, feeling faint or lightheaded, decrease in the amount of urine, swelling of the ankles, hands, or feet, trouble breathing Infection--fever, chills, cough, sore throat, wounds that don't heal, pain or trouble when passing urine, general feeling of discomfort or being unwell Liver injury--right upper belly pain, loss of appetite, nausea, light-colored stool, dark yellow or brown urine, yellowing skin or eyes, unusual weakness or fatigue Low red blood cell level--unusual weakness or fatigue, dizziness, headache, trouble breathing Lung injury--shortness of breath or trouble breathing, cough, spitting up blood, chest pain, fever Stomach pain, bloody diarrhea, pale skin, unusual weakness or fatigue,  decrease in the amount of urine, which may be signs of hemolytic uremic syndrome Sudden and severe headache, confusion, change in vision, seizures, which may be signs of posterior reversible encephalopathy syndrome (PRES) Unusual bruising or bleeding Side effects that usually do not require medical attention (report to your care team if they continue or are bothersome): Diarrhea Drowsiness Hair loss Nausea Pain, redness, or swelling with sores inside the mouth or throat Vomiting This list may not describe all possible side effects. Call your doctor for medical advice about side effects. You may report side effects to FDA at 1-800-FDA-1088. Where should I keep my medication? This medication is given in a hospital or clinic. It will not be stored at home. NOTE: This sheet is a summary. It may not cover all possible information. If you have questions about this medicine, talk to your doctor, pharmacist, or health care provider.  2024 Elsevier/Gold Standard (2022-01-09 00:00:00)  Paclitaxel  Nanoparticle Albumin-Bound Injection What is this medication? NANOPARTICLE ALBUMIN-BOUND PACLITAXEL  (Na no PAHR ti kuhl al BYOO muhn-bound PAK li TAX el) treats some types of cancer. It works by slowing down the growth of cancer cells. This medicine may be used for other purposes; ask your health care provider or pharmacist if you have questions. COMMON BRAND NAME(S): Abraxane  What should I tell my care team before I take this medication? They need  to know if you have any of these conditions: Liver disease Low white blood cell levels An unusual or allergic reaction to paclitaxel , albumin, other medications, foods, dyes, or preservatives If you or your partner are pregnant or trying to get pregnant Breast-feeding How should I use this medication? This medication is injected into a vein. It is given by your care team in a hospital or clinic setting. Talk to your care team about the use of this medication  in children. Special care may be needed. Overdosage: If you think you have taken too much of this medicine contact a poison control center or emergency room at once. NOTE: This medicine is only for you. Do not share this medicine with others. What if I miss a dose? Keep appointments for follow-up doses. It is important not to miss your dose. Call your care team if you are unable to keep an appointment. What may interact with this medication? Other medications may affect the way this medication works. Talk with your care team about all of the medications you take. They may suggest changes to your treatment plan to lower the risk of side effects and to make sure your medications work as intended. This list may not describe all possible interactions. Give your health care provider a list of all the medicines, herbs, non-prescription drugs, or dietary supplements you use. Also tell them if you smoke, drink alcohol, or use illegal drugs. Some items may interact with your medicine. What should I watch for while using this medication? Your condition will be monitored carefully while you are receiving this medication. You may need blood work while taking this medication. This medication may make you feel generally unwell. This is not uncommon as chemotherapy can affect healthy cells as well as cancer cells. Report any side effects. Continue your course of treatment even though you feel ill unless your care team tells you to stop. This medication can cause serious allergic reactions. To reduce the risk, your care team may give you other medications to take before receiving this one. Be sure to follow the directions from your care team. This medication may increase your risk of getting an infection. Call your care team for advice if you get a fever, chills, sore throat, or other symptoms of a cold or flu. Do not treat yourself. Try to avoid being around people who are sick. This medication may increase your risk  to bruise or bleed. Call your care team if you notice any unusual bleeding. Be careful brushing or flossing your teeth or using a toothpick because you may get an infection or bleed more easily. If you have any dental work done, tell your dentist you are receiving this medication. Talk to your care team if you or your partner may be pregnant. Serious birth defects can occur if you take this medication during pregnancy and for 6 months after the last dose. You will need a negative pregnancy test before starting this medication. Contraception is recommended while taking this medication and for 6 months after the last dose. Your care team can help you find the option that works for you. If your partner can get pregnant, use a condom during sex while taking this medication and for 3 months after the last dose. Do not breastfeed while taking this medication and for 2 weeks after the last dose. This medication may cause infertility. Talk to your care team if you are concerned about your fertility. What side effects may I notice from receiving this  medication? Side effects that you should report to your care team as soon as possible: Allergic reactions--skin rash, itching, hives, swelling of the face, lips, tongue, or throat Dry cough, shortness of breath or trouble breathing Infection--fever, chills, cough, sore throat, wounds that don't heal, pain or trouble when passing urine, general feeling of discomfort or being unwell Low red blood cell level--unusual weakness or fatigue, dizziness, headache, trouble breathing Pain, tingling, or numbness in the hands or feet Stomach pain, unusual weakness or fatigue, nausea, vomiting, diarrhea, or fever that lasts longer than expected Unusual bruising or bleeding Side effects that usually do not require medical attention (report to your care team if they continue or are bothersome): Diarrhea Fatigue Hair loss Loss of appetite Nausea Vomiting This list may not  describe all possible side effects. Call your doctor for medical advice about side effects. You may report side effects to FDA at 1-800-FDA-1088. Where should I keep my medication? This medication is given in a hospital or clinic. It will not be stored at home. NOTE: This sheet is a summary. It may not cover all possible information. If you have questions about this medicine, talk to your doctor, pharmacist, or health care provider.  2024 Elsevier/Gold Standard (2022-01-18 00:00:00)      To help prevent nausea and vomiting after your treatment, we encourage you to take your nausea medication as directed.  BELOW ARE SYMPTOMS THAT SHOULD BE REPORTED IMMEDIATELY: *FEVER GREATER THAN 100.4 F (38 C) OR HIGHER *CHILLS OR SWEATING *NAUSEA AND VOMITING THAT IS NOT CONTROLLED WITH YOUR NAUSEA MEDICATION *UNUSUAL SHORTNESS OF BREATH *UNUSUAL BRUISING OR BLEEDING *URINARY PROBLEMS (pain or burning when urinating, or frequent urination) *BOWEL PROBLEMS (unusual diarrhea, constipation, pain near the anus) TENDERNESS IN MOUTH AND THROAT WITH OR WITHOUT PRESENCE OF ULCERS (sore throat, sores in mouth, or a toothache) UNUSUAL RASH, SWELLING OR PAIN  UNUSUAL VAGINAL DISCHARGE OR ITCHING   Items with * indicate a potential emergency and should be followed up as soon as possible or go to the Emergency Department if any problems should occur.  Please show the CHEMOTHERAPY ALERT CARD or IMMUNOTHERAPY ALERT CARD at check-in to the Emergency Department and triage nurse.  Should you have questions after your visit or need to cancel or reschedule your appointment, please contact [facility] - A DEPT OF [facility]  Dept: [phone]  and follow the prompts.  Office hours are 8:00 a.m. to 4:30 p.m. Monday - Friday. Please note that voicemails left after 4:00 p.m. may not be returned until the following business day.  We are closed weekends and major holidays. You have access to a  nurse at all times for urgent questions. Please call the main number to the clinic Dept: [phone] and follow the prompts.   For any non-urgent questions, you may also contact your provider using MyChart. We now offer e-Visits for anyone 56 and older to request care online for non-urgent symptoms. For details visit [email or web address].   Also download the MyChart app! Go to the app store, search MyChart, open the app, select [place], and log in with your MyChart username and password.

## 2024-04-30 LAB — CANCER ANTIGEN 19-9: CA 19-9: 628 U/mL — ABNORMAL HIGH (ref 0–35)

## 2024-05-04 ENCOUNTER — Other Ambulatory Visit: Payer: Self-pay | Admitting: *Deleted

## 2024-05-04 ENCOUNTER — Ambulatory Visit (INDEPENDENT_AMBULATORY_CARE_PROVIDER_SITE_OTHER): Admitting: Clinical

## 2024-05-04 DIAGNOSIS — F22 Delusional disorders: Secondary | ICD-10-CM | POA: Diagnosis not present

## 2024-05-04 NOTE — Progress Notes (Signed)
 THERAPIST PROGRESS NOTE  Session Time: 25 min  Participation Level: Active  Behavioral Response: CasualAlertEuthymic  Type of Therapy: Individual Therapy  Treatment Goals addressed:  [name] will cooperate with a psychiatric evaluation on 02/25/24 and during all follow-up visits   ProgressTowards Goals: Progressing  Interventions: CBT and Supportive  Summary:  Kathleen Stone is a 58 y.o. female who presents with a scheduled appointment oriented x 5, appropriately dressed, and friendly.  Client reported AVH. Client reported on today she has been feeling tired.  Client reported going through the chemotherapy treatments it has been taking a lot of her energy.  Client reported she has been taking herself to and from her chemotherapy as she feels like she has been taking to it pretty well.  Client reported her doctors are going to follow-up soon for an MRI to see about the current progression of the cancer.  Client reported otherwise she has not been taking the medication prescribed by the psychiatrist as she should.  Client reported however she is going to start back taking it because the creatures are coming back.  Client reported it feels like something is moving underneath the couch for in her bed.  Client reported nobody else can feel it.  Client reported she feels like it is a spiritual connection as it is trying to tell her that something is going to happen but she does not know what.  Client reported feeling a sensation like her skin is tingling or burning when people are around her and exchanging certain types of energy.  Client reported although she continues to experience those symptoms it does not get her to a point of the stress of having suicidal ideations or thoughts of harming herself.  Client reported alternatively she has been thinking about finding some activities she can do or if someone she can go that is outside of the house.  Client reported her divorce was finalized in  late July of this year.  Client reported her ex-wife did not show up so she feels like she made the best decision of following through with the divorce. Evidence of progress towards goal: Client reported 1 positive of taking care of her medical needs.  Client reported 1 positive that she is brainstorming ways to keep herself from being isolated.  Suicidal/Homicidal: Nowithout intent/plan  Therapist Response:  Therapist began the appointment asking the client how she has been doing since last seen. Therapist engaged using active listening and positive emotional support. Therapist used CBT to engage and asked the client how she has been feeling physically in regards to her health diagnoses. Therapist used CBT to ask the client about current severity of AVH symptoms compared to any depressive or anxiety symptoms she may have. Therapist used CBT to positively reinforce medication compliance, self-care, and behavioral activation within her limitations. Therapist used CBT ask the client to identify her progress with frequency of use with coping skills with continued practice in her daily activity.    Therapist assigned client homework to practice self-care.   Plan: Return again in 4 weeks.  Diagnosis: paranoia  Collaboration of Care: Patient refused AEB none requested by the client.  Patient/Guardian was advised Release of Information must be obtained prior to any record release in order to collaborate their care with an outside provider. Patient/Guardian was advised if they have not already done so to contact the registration department to sign all necessary forms in order for us  to release information regarding their care.   Consent:  Patient/Guardian gives verbal consent for treatment and assignment of benefits for services provided during this visit. Patient/Guardian expressed understanding and agreed to proceed.   Mavrik Bynum Y Wynn Kernes, LCSW 05/04/2024

## 2024-05-04 NOTE — Patient Outreach (Addendum)
 Complex Care Management   Visit Note  05/04/2024  Name:  Kathleen Stone MRN: [id number] DOB: 07/20/1966  Situation: Referral received for Complex Care Management related to Pancreatic Mass I obtained verbal consent from Patient.  Visit completed with patient  on the phone  Background:   Past Medical History:  Diagnosis Date   Abnormal stress test 01/16/2017   Brain tumor (benign) (HCC)    DJD (degenerative joint disease) of cervical spine    Galactorrhea of both breasts 2009   R. sided w/ benign papilloma excised in 4/09. L sided in 5/09   Hematuria, microscopic 02/24/2020   Hyperlipidemia    Hypertension    Hypokalemia    LOW BACK PAIN SYNDROME 07/10/2007   Qualifier: Diagnosis of   By: Lanis MD, Christopher         Nausea and vomiting 08/01/2022   Pancreatic cancer [facility])    Personal history of adenomatous colonic polyp    09/2007 - diminutive adenoma [facility])   Stroke [facility])    2021- brain tumor bx caused ? stroke-    Tobacco abuse    Unintentional weight loss 05/26/2022    Assessment:  Patient Reported Symptoms:  Cognitive Cognitive Status: Alert and oriented to person, place, and time, Able to follow simple commands, Normal speech and language skills   Health Maintenance Behaviors: Annual physical exam  Neurological Neurological Review of Symptoms: No symptoms reported    HEENT HEENT Symptoms Reported: No symptoms reported      Cardiovascular Cardiovascular Symptoms Reported: No symptoms reported Weight: 176 lb (79.8 kg)  Respiratory      Endocrine Endocrine Symptoms Reported: No symptoms reported Is patient diabetic?: No    Gastrointestinal Gastrointestinal Symptoms Reported: Flatulence, Diarrhea (Providers are aware with ongoing medication pt can take.) Gastrointestinal Management Strategies: Medication therapy    Genitourinary Genitourinary Symptoms Reported: No symptoms reported    Integumentary Integumentary Symptoms Reported: No symptoms reported     Musculoskeletal Musculoskelatal Symptoms Reviewed: No symptoms reported        Psychosocial Psychosocial Symptoms Reported: No symptoms reported     Quality of Family Relationships: supportive, involved Do you feel physically threatened by others?: No      04/29/2024   10:00 AM  Depression screen PHQ 2/9  Decreased Interest 0  Down, Depressed, Hopeless 0  PHQ - 2 Score 0    There were no vitals filed for this visit.  Medications Reviewed Today     Reviewed by Alvia Olam BIRCH, RN (Registered Nurse) on 05/04/24 at [phone]  Med List Status: <None>   Medication Order Taking? Sig Documenting Provider Last Dose Status Informant  acetaminophen  (TYLENOL ) 325 MG tablet [id number]  Take by mouth every 6 (six) hours as needed.  Patient not taking: Reported on 04/29/2024   [provider]  Active   albuterol  (VENTOLIN  HFA) 108 (90 Base) MCG/ACT inhaler [id number] Yes Inhale 2 puffs into the lungs every 6 (six) hours as needed for wheezing or shortness of breath. Cloretta Arley NOVAK, MD  Active   amLODipine  (NORVASC ) 5 MG tablet [id number] Yes Take 1 tablet (5 mg total) by mouth daily. Heddy Barren, DO  Active   apixaban  (ELIQUIS ) 5 MG TABS tablet [id number]  Take 1 tablet (5 mg total) by mouth 2 (two) times daily.  Patient not taking: Reported on 04/29/2024   Heddy Barren, DO  Active            Med Note MERNA, DEVERE LITTIE   Fri Feb 21, 2024  9:00 AM) On Hold x 2 weeks  dexamethasone  (DECADRON ) 4 MG tablet [id number] Yes Take 1 tablet (4 mg total) by mouth 2 (two) times daily. Take one tablet twice daily x 3 days after chemo. Start on 04/15/2024 Cloretta Arley NOVAK, MD  Active   lidocaine -prilocaine  (EMLA ) cream [id number] Yes Apply 1 Application topically as needed. Cloretta Arley NOVAK, MD  Active   lipase/protease/amylase (CREON ) 36000 UNITS CPEP capsule [id number]  Take 2 capsules (72,000 Units total) by mouth 3 (three) times daily with meals.  Patient not taking: Reported on 04/29/2024    Debby Olam POUR, NP  Active   loperamide (IMODIUM) 2 MG capsule [id number]  Take 2-4 mg by mouth as needed for diarrhea or loose stools. Maximum 8/day  Patient not taking: Reported on 04/29/2024   [provider]  Active   mirtazapine  (REMERON ) 7.5 MG tablet [id number]  Take 1 tablet (7.5 mg total) by mouth at bedtime.  Patient not taking: Reported on 04/29/2024   Homer Shams, MD  Active   nitroGLYCERIN  (NITROSTAT ) 0.4 MG SL tablet [id number] Yes Place 1 tablet (0.4 mg total) under the tongue every 5 (five) minutes as needed for chest pain. Atway, Rayann N, DO  Active Self  ondansetron  (ZOFRAN ) 8 MG tablet [id number] Yes Take 1 tablet (8 mg total) by mouth every 8 (eight) hours as needed. Cloretta Arley NOVAK, MD  Active   pantoprazole  (PROTONIX ) 40 MG tablet [id number]  Take 1 tablet (40 mg total) by mouth daily.  Patient not taking: Reported on 04/29/2024   Cloretta Arley NOVAK, MD  Active   potassium chloride  SA (KLOR-CON  M) 20 MEQ tablet [id number] Yes Take 1 tablet (20 mEq total) by mouth daily. Debby Olam POUR, NP  Active   prochlorperazine  (COMPAZINE ) 10 MG tablet [id number] Yes Take 1 tablet (10 mg total) by mouth every 6 (six) hours as needed. Cloretta Arley NOVAK, MD  Active             Recommendation:   PCP Follow-up Continue Current Plan of Care  Follow Up Plan:   Telephone follow up appointment date/time:  06/05/2024 @ 10:00 am   Olam Ku, RN, BSN [place]  [facility], [facility] RN Care Manager Direct Dial: [phone]  Fax: [phone]

## 2024-05-04 NOTE — Patient Instructions (Signed)
 Visit Information  Thank you for taking time to visit with me today. Please don't hesitate to contact me if I can be of assistance to you before our next scheduled appointment.  Your next care management appointment is by telephone on 06/05/2024 at 10:00 am   Please call the care guide team at [phone] if you need to cancel, schedule, or reschedule an appointment.   Please call the Suicide and Crisis Lifeline: 988 call the [place]  National Suicide Prevention Lifeline: (865)016-1759 or TTY: 928-262-4459 TTY 432-661-5522) to talk to a trained counselor call 1-800-273-TALK (toll free, 24 hour hotline) if you are experiencing a Mental Health or Behavioral Health Crisis or need someone to talk to.   Olam Ku, RN, BSN [place]  [facility], [facility] RN Care Manager Direct Dial: [phone]  Fax: [phone]

## 2024-05-05 ENCOUNTER — Ambulatory Visit (HOSPITAL_COMMUNITY): Admitting: Clinical

## 2024-05-06 ENCOUNTER — Other Ambulatory Visit: Payer: Self-pay

## 2024-05-10 ENCOUNTER — Other Ambulatory Visit: Payer: Self-pay | Admitting: Oncology

## 2024-05-14 ENCOUNTER — Telehealth: Payer: Self-pay

## 2024-05-14 ENCOUNTER — Encounter: Payer: Self-pay | Admitting: Nurse Practitioner

## 2024-05-14 ENCOUNTER — Inpatient Hospital Stay

## 2024-05-14 ENCOUNTER — Inpatient Hospital Stay (HOSPITAL_BASED_OUTPATIENT_CLINIC_OR_DEPARTMENT_OTHER): Admitting: Nurse Practitioner

## 2024-05-14 VITALS — BP 137/65 | HR 68 | Temp 98.0°F | Resp 18 | Ht 66.0 in | Wt 174.9 lb

## 2024-05-14 VITALS — BP 161/73 | HR 58 | Temp 98.1°F | Resp 18

## 2024-05-14 DIAGNOSIS — C251 Malignant neoplasm of body of pancreas: Secondary | ICD-10-CM

## 2024-05-14 DIAGNOSIS — Z5111 Encounter for antineoplastic chemotherapy: Secondary | ICD-10-CM | POA: Diagnosis not present

## 2024-05-14 LAB — CBC WITH DIFFERENTIAL (CANCER CENTER ONLY)
Abs Immature Granulocytes: 0.02 K/uL (ref 0.00–0.07)
Basophils Absolute: 0 K/uL (ref 0.0–0.1)
Basophils Relative: 0 %
Eosinophils Absolute: 0.1 K/uL (ref 0.0–0.5)
Eosinophils Relative: 1 %
HCT: 34.3 % — ABNORMAL LOW (ref 36.0–46.0)
Hemoglobin: 11.3 g/dL — ABNORMAL LOW (ref 12.0–15.0)
Immature Granulocytes: 0 %
Lymphocytes Relative: 15 %
Lymphs Abs: 1.2 K/uL (ref 0.7–4.0)
MCH: 31 pg (ref 26.0–34.0)
MCHC: 32.9 g/dL (ref 30.0–36.0)
MCV: 94 fL (ref 80.0–100.0)
Monocytes Absolute: 0.6 K/uL (ref 0.1–1.0)
Monocytes Relative: 7 %
Neutro Abs: 6.2 K/uL (ref 1.7–7.7)
Neutrophils Relative %: 77 %
Platelet Count: 141 K/uL — ABNORMAL LOW (ref 150–400)
RBC: 3.65 MIL/uL — ABNORMAL LOW (ref 3.87–5.11)
RDW: 19.7 % — ABNORMAL HIGH (ref 11.5–15.5)
WBC Count: 8.1 K/uL (ref 4.0–10.5)
nRBC: 0 % (ref 0.0–0.2)

## 2024-05-14 LAB — CMP (CANCER CENTER ONLY)
ALT: 13 U/L (ref 0–44)
AST: 19 U/L (ref 15–41)
Albumin: 3.6 g/dL (ref 3.5–5.0)
Alkaline Phosphatase: 99 U/L (ref 38–126)
Anion gap: 9 (ref 5–15)
BUN: 7 mg/dL (ref 6–20)
CO2: 25 mmol/L (ref 22–32)
Calcium: 9.6 mg/dL (ref 8.9–10.3)
Chloride: 108 mmol/L (ref 98–111)
Creatinine: 0.77 mg/dL (ref 0.44–1.00)
GFR, Estimated: >60 mL/min (ref >60)
Glucose, Bld: 85 mg/dL (ref 70–99)
Potassium: 4 mmol/L (ref 3.5–5.1)
Sodium: 143 mmol/L (ref 135–145)
Total Bilirubin: 0.6 mg/dL (ref 0.0–1.2)
Total Protein: 6.4 g/dL — ABNORMAL LOW (ref 6.5–8.1)

## 2024-05-14 MED ORDER — HEPARIN SOD (PORK) LOCK FLUSH 100 UNIT/ML IV SOLN: 500.0000 [IU] | Freq: Once | INTRAVENOUS | Status: DC | PRN

## 2024-05-14 MED ORDER — SODIUM CHLORIDE 0.9 % IV SOLN
1000.0000 mg/m2 | Freq: Once | INTRAVENOUS | Status: AC
  Administered 2024-05-14: 1900 mg via INTRAVENOUS
  Filled 2024-05-14: qty 49.97

## 2024-05-14 MED ORDER — SODIUM CHLORIDE 0.9 % IV SOLN: Freq: Once | INTRAVENOUS | Status: AC

## 2024-05-14 MED ORDER — DEXAMETHASONE SODIUM PHOSPHATE 10 MG/ML IJ SOLN
10.0000 mg | Freq: Once | INTRAMUSCULAR | Status: AC
  Administered 2024-05-14: 10 mg via INTRAVENOUS
  Filled 2024-05-14: qty 1

## 2024-05-14 MED ORDER — PREDNISONE 50 MG PO TABS: ORAL_TABLET | ORAL | 0 refills | Status: DC

## 2024-05-14 MED ORDER — SODIUM CHLORIDE 0.9% FLUSH: 10.0000 mL | INTRAVENOUS | Status: DC | PRN

## 2024-05-14 MED ORDER — DOXYCYCLINE HYCLATE 100 MG PO TABS: 100.0000 mg | ORAL_TABLET | Freq: Two times a day (BID) | ORAL | 0 refills | Status: AC

## 2024-05-14 MED ORDER — PACLITAXEL PROTEIN-BOUND CHEMO INJECTION 100 MG
100.0000 mg/m2 | Freq: Once | INTRAVENOUS | Status: AC
  Administered 2024-05-14: 200 mg via INTRAVENOUS
  Filled 2024-05-14: qty 40

## 2024-05-14 MED ORDER — PALONOSETRON HCL INJECTION 0.25 MG/5ML
0.2500 mg | Freq: Once | INTRAVENOUS | Status: AC
  Administered 2024-05-14: 0.25 mg via INTRAVENOUS
  Filled 2024-05-14: qty 5

## 2024-05-14 NOTE — Progress Notes (Signed)
 Patient seen by Olam Ned NP today  Vitals are within treatment parameters:Yes  Decreased 2 pounds Labs are within treatment parameters: Yes   Treatment plan has been signed: Yes   Per physician team, Patient is ready for treatment and there are NO modifications to the treatment plan.

## 2024-05-14 NOTE — Progress Notes (Signed)
 [facility] OFFICE PROGRESS NOTE   Diagnosis: Pancreas cancer  INTERVAL HISTORY:   Kathleen Stone returns as scheduled.  She completed another cycle of gemcitabine /Abraxane  04/29/2024.  She denies nausea/vomiting.  No mouth sores.  No change in baseline bowel habits, loose stools.  No rash.  No fever after treatment.  No numbness or tingling in the hands or feet.  No abdominal pain.  She reports a skin lesion at the low abdomen/pelvis.  No fever.  Objective:  Vital signs in last 24 hours:  Blood pressure 137/65, pulse 68, temperature 98 F (36.7 C), temperature source Temporal, resp. rate 18, height 5' 6 (1.676 m), weight 174 lb 14.4 oz (79.3 kg), last menstrual period 02/07/2016, SpO2 100%.    HEENT: No thrush or ulcers. Resp: Lungs clear bilaterally. Cardio: Regular rate and rhythm. GI: No hepatosplenomegaly.  No mass.  Nontender. Vascular: No leg edema. Skin: No rash.  Small superficially open lesion at the low abdomen/pelvis with surrounding induration, associated tenderness.  No erythema. Port-A-Cath without erythema.  Lab Results:  Lab Results  Component Value Date   WBC 8.1 05/14/2024   HGB 11.3 (L) 05/14/2024   HCT 34.3 (L) 05/14/2024   MCV 94.0 05/14/2024   PLT 141 (L) 05/14/2024   NEUTROABS 6.2 05/14/2024    Imaging:  No results found.  Medications: I have reviewed the patient's current medications.  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 CT abdomen/pelvis 07/30/2022-body/neck pancreas mass with peripheral pancreatic atrophy and duct dilation, suspected encasement of the celiac axis, uterine fibroids MRI abdomen 07/30/2022-3.7 x 4.7 cm pancreas body/tail mass with encasement of the proximal celiac artery, occlusion with collateralization at the confluence of the SMV and portal vein, no evidence of metastatic disease CT chest 08/01/2022-no intrathoracic metastases Elevated CA 19-9 EUS 08/02/2022-gastric polyp, gastric erosions, 32 x 33 mm pancreas  body mass, invasion of the celiac trunk, invasion of the splenoportal confluence, no SMA invasion, 2 malignant appearing nodes in the porta hepatis,T4N1 by endoscopic criteria, FNA biopsy-adenocarcinoma 08/17/2022 [employer]-tumor mutation burden 0.96; MSI high not detected; 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  Stable porta hepatis lymph nodes. Cycle 7 gemcitabine /Abraxane  11/09/2022 Cycle 8 gemcitabine /Abraxane  11/23/2022 Cycle 9 gemcitabine /Abraxane  12/07/2022 Cycle 10 gemcitabine /Abraxane  12/21/2022 Cycle 11 gemcitabine /Abraxane  01/04/2023 Cycle 12 gemcitabine /Abraxane  01/18/2023 Cycle 13 gemcitabine /Abraxane  02/01/2023 Cycle 14 gemcitabine /Abraxane  02/15/2023 CT abdomen/pelvis 02/26/2023-stable appearance of pancreas body mass with vascular encasement and chronic occlusion of the splenic vein, no evidence of metastatic disease Cycle 15 gemcitabine /Abraxane  03/01/2023 Cycle 16 gemcitabine /Abraxane  03/15/2023 Cycle 17 Gemcitabine /Abraxane  03/29/2023 EUS pancreas fiducial marker placement 04/17/2023 Pancreas SBRT 05/07/2023 - 05/17/2023, 33 Gray in 5 fractions CT abdomen/pelvis 09/14/2023: Slight decrease in pancreas body mass with persistent vascular encasement, no evidence of metastatic disease CTs [facility] 01/02/2024-ill-defined hypoattenuating mass in the mid pancreatic body surrounding surgical clips.  Pancreatic duct within the distal pancreatic body and tail is dilated.  Mass encases the celiac trunk.  Common bile duct is dilated with suggestion of focal stenosis in the mid bile duct.   High-grade stenosis/occlusion of the portal vein.  Prominent vessels in the upper abdomen.  Heterogeneous appearance of the splenic parenchyma with suggestion of an infiltrating hypoattenuating mass/masses versus sequela of splenic infarcts.  Multiple pulmonary  nodules in the lung bases concerning for metastatic disease. CTs chest/abdomen/pelvis 01/06/2024-no PE.  Cardiomegaly with small pericardial effusion.  Small irregular focus of density medial right lung base.  Poorly defined pancreatic body mass measuring 3 x 2.2 cm, previously 3 x 2.5 cm, possible extension of infiltrative soft tissue towards the pancreatic neck region with ill-defined density at the porta hepatis compared to prior.  Slight increased extrahepatic biliary and common duct dilatation compared to prior.  No focal splenic abnormality. Cycle 1 gemcitabine /abraxane  02/07/2024 Cycle 2 gemcitabine /Abraxane  03/18/2024 Cycle 3 gemcitabine /Abraxane  04/01/2024, premed changed from Compazine  to Zofran  Cycle 4 gemcitabine /Abraxane  04/14/2024, premedication changed to Aloxi /Decadron  Cycle 5 gemcitabine /Abraxane  04/29/2024, premedication continued Aloxi /Decadron  Cycle 6 gemcitabine /Abraxane  05/14/2024, premedication continued Aloxi /Decadron      2.  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 with observation Brain MRI 02/09/2024-interval development of hydrocephalus with transependymal flow of CSF.  Similar size of of enhancing mass in the dorsal right midbrain effacing the cerebral aqueduct.  No new enhancing lesions identified.  She was transferred to [facility].  She underwent an endoscopic third ventriculostomy on 02/12/2024.   Brain CT 03/13/2024-interval resolution of obstructive hydrocephalus and transependymal edema.  Mild interval decrease in size of a mass within the right midbrain, compatible with glioma.  Chronic encephalomalacia changes in the right frontal lobe. 02/12/2024: Endoscopic ventriculostomy at [facility]   3.   Anorexia/weight loss secondary to #1 4.  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  Abdominal pain 6.  History of colon polyps 7.  Family history of multiple cancers Pancreas cancer maternal aunt, maternal cousin, maternal grandfather Breast cancer-maternal aunt, maternal cousin Prostate cancer-maternal uncle Lung cancer-father 8.  Hypertension 9.  Postmenopausal vaginal bleeding 10.  H. pylori 08/02/2022-amoxicillin  and Flagyl  discontinued 08/21/2022 due to GI symptoms 11.  Syncope event 03/07/2023 12.  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  She has completed 5 cycles of gemcitabine /Abraxane .  Now tolerating well, no further issues with nausea.  Plan to proceed with cycle 6 today as scheduled.  Restaging CTs prior to next office visit.  CT premeds sent to her pharmacy.  Of note, no signs of an allergic reaction following last CT contrast.  CBC and chemistry panel reviewed.  Labs adequate for treatment.  She has a small skin lesion at the low abdomen/pelvis with surrounding induration.  She will complete a 7-day course of doxycycline .  Potential side effects including sun sensitivity, nausea, esophagitis reviewed with her.  She will return for follow-up and treatment in 2 weeks.  She will contact the office in the interim with any problems.    Olam Ned ANP/GNP-BC   05/14/2024  8:52 AM

## 2024-05-14 NOTE — Telephone Encounter (Signed)
 Spoke with Jacques at [facility] health imaging [facility] in regards to scheduling a CT chest ABD pelvis for this patient, scheduled for 05/25/24 at 5:30 pm, stated a prep would need to be done ahead of time, when patient picks up medication prior pharmacy will go over. Notified patient in the infusion room of appointment, prep, understood. No further questions.

## 2024-05-14 NOTE — Patient Instructions (Signed)

## 2024-05-14 NOTE — Patient Instructions (Signed)
 [facility] - A DEPT OF [facility]  Discharge Instructions: Thank you for choosing [facility] to provide your oncology and hematology care.   If you have a lab appointment with the Cancer Center, please go directly to the Cancer Center and check in at the registration area.   Wear comfortable clothing and clothing appropriate for easy access to any Portacath or PICC line.   We strive to give you quality time with your provider. You may need to reschedule your appointment if you arrive late (15 or more minutes).  Arriving late affects you and other patients whose appointments are after yours.  Also, if you miss three or more appointments without notifying the office, you may be dismissed from the clinic at the provider's discretion.      For prescription refill requests, have your pharmacy contact our office and allow 72 hours for refills to be completed.    Today you received the following chemotherapy and/or immunotherapy agents: Paclitaxel  protein-bound (Abraxane ) and Gemcitabine  (Gemzar )      To help prevent nausea and vomiting after your treatment, we encourage you to take your nausea medication as directed.  BELOW ARE SYMPTOMS THAT SHOULD BE REPORTED IMMEDIATELY: *FEVER GREATER THAN 100.4 F (38 C) OR HIGHER *CHILLS OR SWEATING *NAUSEA AND VOMITING THAT IS NOT CONTROLLED WITH YOUR NAUSEA MEDICATION *UNUSUAL SHORTNESS OF BREATH *UNUSUAL BRUISING OR BLEEDING *URINARY PROBLEMS (pain or burning when urinating, or frequent urination) *BOWEL PROBLEMS (unusual diarrhea, constipation, pain near the anus) TENDERNESS IN MOUTH AND THROAT WITH OR WITHOUT PRESENCE OF ULCERS (sore throat, sores in mouth, or a toothache) UNUSUAL RASH, SWELLING OR PAIN  UNUSUAL VAGINAL DISCHARGE OR ITCHING   Items with * indicate a potential emergency and should be followed up as soon as possible or go to the Emergency Department if any problems should occur.  Please  show the CHEMOTHERAPY ALERT CARD or IMMUNOTHERAPY ALERT CARD at check-in to the Emergency Department and triage nurse.  Should you have questions after your visit or need to cancel or reschedule your appointment, please contact [facility] - A DEPT OF [facility]  Dept: [phone]  and follow the prompts.  Office hours are 8:00 a.m. to 4:30 p.m. Monday - Friday. Please note that voicemails left after 4:00 p.m. may not be returned until the following business day.  We are closed weekends and major holidays. You have access to a nurse at all times for urgent questions. Please call the main number to the clinic Dept: [phone] and follow the prompts.   For any non-urgent questions, you may also contact your provider using MyChart. We now offer e-Visits for anyone 31 and older to request care online for non-urgent symptoms. For details visit [email or web address].   Also download the MyChart app! Go to the app store, search MyChart, open the app, select [place], and log in with your MyChart username and password.

## 2024-05-15 LAB — CANCER ANTIGEN 19-9: CA 19-9: 519 U/mL — ABNORMAL HIGH (ref 0–35)

## 2024-05-19 ENCOUNTER — Encounter: Payer: Self-pay | Admitting: Oncology

## 2024-05-23 ENCOUNTER — Other Ambulatory Visit: Payer: Self-pay | Admitting: Oncology

## 2024-05-25 ENCOUNTER — Inpatient Hospital Stay: Attending: Oncology

## 2024-05-25 ENCOUNTER — Other Ambulatory Visit: Payer: Self-pay

## 2024-05-25 ENCOUNTER — Encounter (HOSPITAL_BASED_OUTPATIENT_CLINIC_OR_DEPARTMENT_OTHER): Payer: Self-pay

## 2024-05-25 ENCOUNTER — Ambulatory Visit (HOSPITAL_BASED_OUTPATIENT_CLINIC_OR_DEPARTMENT_OTHER)
Admission: RE | Admit: 2024-05-25 | Discharge: 2024-05-25 | Disposition: A | Discharge status: Home / Self Care | Source: Ambulatory Visit | Attending: Nurse Practitioner | Admitting: Nurse Practitioner

## 2024-05-25 ENCOUNTER — Emergency Department (HOSPITAL_BASED_OUTPATIENT_CLINIC_OR_DEPARTMENT_OTHER)
Admission: EM | Admit: 2024-05-25 | Discharge: 2024-05-25 | Disposition: A | Discharge status: Home / Self Care | Source: Ambulatory Visit | Attending: Emergency Medicine | Admitting: Emergency Medicine

## 2024-05-25 DIAGNOSIS — R978 Other abnormal tumor markers: Secondary | ICD-10-CM | POA: Insufficient documentation

## 2024-05-25 DIAGNOSIS — Z5111 Encounter for antineoplastic chemotherapy: Secondary | ICD-10-CM | POA: Insufficient documentation

## 2024-05-25 DIAGNOSIS — D6959 Other secondary thrombocytopenia: Secondary | ICD-10-CM | POA: Insufficient documentation

## 2024-05-25 DIAGNOSIS — T782XXA Anaphylactic shock, unspecified, initial encounter: Secondary | ICD-10-CM | POA: Diagnosis not present

## 2024-05-25 DIAGNOSIS — T7840XA Allergy, unspecified, initial encounter: Secondary | ICD-10-CM | POA: Diagnosis present

## 2024-05-25 DIAGNOSIS — Z7901 Long term (current) use of anticoagulants: Secondary | ICD-10-CM | POA: Insufficient documentation

## 2024-05-25 DIAGNOSIS — Z23 Encounter for immunization: Secondary | ICD-10-CM | POA: Insufficient documentation

## 2024-05-25 DIAGNOSIS — C251 Malignant neoplasm of body of pancreas: Secondary | ICD-10-CM | POA: Diagnosis present

## 2024-05-25 DIAGNOSIS — Z8507 Personal history of malignant neoplasm of pancreas: Secondary | ICD-10-CM | POA: Diagnosis not present

## 2024-05-25 DIAGNOSIS — I10 Essential (primary) hypertension: Secondary | ICD-10-CM | POA: Diagnosis not present

## 2024-05-25 LAB — URINALYSIS, ROUTINE W REFLEX MICROSCOPIC
Bilirubin Urine: NEGATIVE
Glucose, UA: NEGATIVE mg/dL
Hgb urine dipstick: NEGATIVE
Ketones, ur: NEGATIVE mg/dL
Leukocytes,Ua: NEGATIVE
Nitrite: NEGATIVE
Specific Gravity, Urine: >1.046 — ABNORMAL HIGH (ref 1.005–1.030)
pH: 5.5 (ref 5.0–8.0)

## 2024-05-25 LAB — CBG MONITORING, ED: Glucose-Capillary: 364 mg/dL — ABNORMAL HIGH (ref 70–99)

## 2024-05-25 LAB — CBC WITH DIFFERENTIAL/PLATELET
Abs Immature Granulocytes: 0.18 K/uL — ABNORMAL HIGH (ref 0.00–0.07)
Basophils Absolute: 0 K/uL (ref 0.0–0.1)
Basophils Relative: 0 %
Eosinophils Absolute: 0 K/uL (ref 0.0–0.5)
Eosinophils Relative: 0 %
HCT: 39.4 % (ref 36.0–46.0)
Hemoglobin: 13 g/dL (ref 12.0–15.0)
Immature Granulocytes: 3 %
Lymphocytes Relative: 31 %
Lymphs Abs: 2.1 K/uL (ref 0.7–4.0)
MCH: 30.7 pg (ref 26.0–34.0)
MCHC: 33 g/dL (ref 30.0–36.0)
MCV: 92.9 fL (ref 80.0–100.0)
Monocytes Absolute: 0.2 K/uL (ref 0.1–1.0)
Monocytes Relative: 3 %
Neutro Abs: 4.3 K/uL (ref 1.7–7.7)
Neutrophils Relative %: 63 %
Platelets: 219 K/uL (ref 150–400)
RBC: 4.24 MIL/uL (ref 3.87–5.11)
RDW: 19.8 % — ABNORMAL HIGH (ref 11.5–15.5)
WBC: 6.8 K/uL (ref 4.0–10.5)
nRBC: 2.2 % — ABNORMAL HIGH (ref 0.0–0.2)

## 2024-05-25 LAB — COMPREHENSIVE METABOLIC PANEL WITH GFR
ALT: 17 U/L (ref 0–44)
AST: 23 U/L (ref 15–41)
Albumin: 3.6 g/dL (ref 3.5–5.0)
Alkaline Phosphatase: 97 U/L (ref 38–126)
Anion gap: 18 — ABNORMAL HIGH (ref 5–15)
BUN: 11 mg/dL (ref 6–20)
CO2: 19 mmol/L — ABNORMAL LOW (ref 22–32)
Calcium: 9.7 mg/dL (ref 8.9–10.3)
Chloride: 103 mmol/L (ref 98–111)
Creatinine, Ser: 0.78 mg/dL (ref 0.44–1.00)
GFR, Estimated: >60 mL/min (ref >60)
Glucose, Bld: 227 mg/dL — ABNORMAL HIGH (ref 70–99)
Potassium: 3.2 mmol/L — ABNORMAL LOW (ref 3.5–5.1)
Sodium: 139 mmol/L (ref 135–145)
Total Bilirubin: 0.4 mg/dL (ref 0.0–1.2)
Total Protein: 6.6 g/dL (ref 6.5–8.1)

## 2024-05-25 MED ORDER — HEPARIN SOD (PORK) LOCK FLUSH 100 UNIT/ML IV SOLN
INTRAVENOUS | Status: AC
  Filled 2024-05-25: qty 5

## 2024-05-25 MED ORDER — DIPHENHYDRAMINE HCL 50 MG/ML IJ SOLN
25.0000 mg | Freq: Once | INTRAMUSCULAR | Status: AC
  Administered 2024-05-25: 25 mg via INTRAVENOUS

## 2024-05-25 MED ORDER — PREDNISONE 10 MG PO TABS: 30.0000 mg | ORAL_TABLET | Freq: Every day | ORAL | 0 refills | Status: AC

## 2024-05-25 MED ORDER — EPINEPHRINE 0.3 MG/0.3ML IJ SOAJ
0.3000 mg | Freq: Once | INTRAMUSCULAR | Status: AC
  Administered 2024-05-25: 0.3 mg via INTRAMUSCULAR

## 2024-05-25 MED ORDER — METHYLPREDNISOLONE SODIUM SUCC 125 MG IJ SOLR
125.0000 mg | Freq: Once | INTRAMUSCULAR | Status: AC
  Administered 2024-05-25: 125 mg via INTRAVENOUS

## 2024-05-25 MED ORDER — LACTATED RINGERS IV BOLUS
1000.0000 mL | Freq: Once | INTRAVENOUS | Status: AC
  Administered 2024-05-25: 1000 mL via INTRAVENOUS

## 2024-05-25 MED ORDER — EPINEPHRINE 0.3 MG/0.3ML IJ SOAJ: 0.3000 mg | INTRAMUSCULAR | 0 refills | Status: AC | PRN

## 2024-05-25 MED ORDER — IOHEXOL 300 MG/ML  SOLN
100.0000 mL | Freq: Once | INTRAMUSCULAR | Status: AC | PRN
  Administered 2024-05-25: 85 mL via INTRAVENOUS

## 2024-05-25 MED ORDER — FAMOTIDINE IN NACL 20-0.9 MG/50ML-% IV SOLN
20.0000 mg | Freq: Once | INTRAVENOUS | Status: AC
  Administered 2024-05-25: 20 mg via INTRAVENOUS
  Filled 2024-05-25: qty 50

## 2024-05-25 NOTE — ED Triage Notes (Signed)
 Allergic reaction to contrast dye in CT. She had pretreated for the scan. Began having SOB, lightheadedness, nausea, vomiting while in CT. Emergency staff called to CT.   Epipen  administered in CT per [facility] Dr. Dreama.

## 2024-05-25 NOTE — ED Notes (Signed)
 Per charge and Lang (NT) EKG was obtained but dr stated he didn't need it.  Sending to medical records

## 2024-05-25 NOTE — ED Provider Notes (Signed)
 [place] EMERGENCY DEPARTMENT AT [facility] Provider Note   CSN: [id number] Arrival date & time: 05/25/24  1623     Patient presents with: Allergic Reaction   Kathleen Stone is a 58 y.o. female.  {Add pertinent medical, surgical, social history, OB history to HPI:32947} HPI       58 year old female with a history of pancreatic cancer, polycystic astrocytoma of the dorsal midbrain followed at [facility]//endoscopic third ventriculostomy Feb 12, 2024, hypertension, portal vein stenosis/occlusion for which she was started on Eliquis  in April 2025 who presents with concern for allergic reaction to IV contrast in the CT scanner.  She had reported allergy to contrast, and took Benadryl  1 hour prior to her outpatient scan.  While she was receiving her CT, she developed diaphoresis, lightheadedness, nausea, vomiting, itching and dyspnea.  She was receiving her outpatient CT scan when the staff was called to the CT scanner due to her condition.  She was found to have a blood pressure of 81 systolic.  She reports she was in her normal state of health prior to the scan.  Denies any recent fever, cough, nausea, vomiting, or other concerns.  Past Medical History:  Diagnosis Date   Abnormal stress test 01/16/2017   Brain tumor (benign) (HCC)    DJD (degenerative joint disease) of cervical spine    Galactorrhea of both breasts 2009   R. sided w/ benign papilloma excised in 4/09. L sided in 5/09   Hematuria, microscopic 02/24/2020   Hyperlipidemia    Hypertension    Hypokalemia    LOW BACK PAIN SYNDROME 07/10/2007   Qualifier: Diagnosis of   By: Lanis MD, Christopher         Nausea and vomiting 08/01/2022   Pancreatic cancer [facility])    Personal history of adenomatous colonic polyp    09/2007 - diminutive adenoma [facility])   Stroke [facility])    2021- brain tumor bx caused ? stroke-    Tobacco abuse    Unintentional weight loss 05/26/2022     Prior to Admission medications   Medication Sig  Start Date End Date Taking? Authorizing Provider  acetaminophen  (TYLENOL ) 325 MG tablet Take by mouth every 6 (six) hours as needed. Patient not taking: Reported on 05/14/2024 02/13/24   [provider]  albuterol  (VENTOLIN  HFA) 108 (90 Base) MCG/ACT inhaler Inhale 2 puffs into the lungs every 6 (six) hours as needed for wheezing or shortness of breath. 02/15/23   Cloretta Arley NOVAK, MD  amLODipine  (NORVASC ) 5 MG tablet Take 1 tablet (5 mg total) by mouth daily. 01/08/24 01/07/25  Tawkaliyar, Roya, DO  apixaban  (ELIQUIS ) 5 MG TABS tablet Take 1 tablet (5 mg total) by mouth 2 (two) times daily. Patient not taking: Reported on 05/14/2024 01/08/24   Tawkaliyar, Roya, DO  dexamethasone  (DECADRON ) 4 MG tablet Take 1 tablet (4 mg total) by mouth 2 (two) times daily. Take one tablet twice daily x 3 days after chemo. Start on 04/15/2024 04/14/24   Cloretta Arley NOVAK, MD  lidocaine -prilocaine  (EMLA ) cream Apply 1 Application topically as needed. 08/08/22   Cloretta Arley NOVAK, MD  lipase/protease/amylase (CREON ) 36000 UNITS CPEP capsule Take 2 capsules (72,000 Units total) by mouth 3 (three) times daily with meals. Patient not taking: Reported on 05/14/2024 01/09/24   Debby Olam POUR, NP  loperamide (IMODIUM) 2 MG capsule Take 2-4 mg by mouth as needed for diarrhea or loose stools. Maximum 8/day Patient not taking: Reported on 05/14/2024    [provider]  mirtazapine  (REMERON ) 7.5 MG tablet Take 1 tablet (7.5 mg total) by mouth at bedtime. Patient not taking: Reported on 05/14/2024 04/20/24 06/19/24  Homer Shams, MD  nitroGLYCERIN  (NITROSTAT ) 0.4 MG SL tablet Place 1 tablet (0.4 mg total) under the tongue every 5 (five) minutes as needed for chest pain. 11/01/21 04/11/25  Atway, Rayann N, DO  ondansetron  (ZOFRAN ) 8 MG tablet Take 1 tablet (8 mg total) by mouth every 8 (eight) hours as needed. 01/31/24   Cloretta Arley NOVAK, MD  pantoprazole  (PROTONIX ) 40 MG tablet Take 1 tablet (40 mg total) by mouth  daily. Patient not taking: Reported on 05/14/2024 01/29/24   Cloretta Arley NOVAK, MD  potassium chloride  SA (KLOR-CON  M) 20 MEQ tablet Take 1 tablet (20 mEq total) by mouth daily. 04/29/24   Thomas, Lisa K, NP  predniSONE  (DELTASONE ) 50 MG tablet Take 50 mg 13 hours, 7 hours, and 1 hour before contrast injection. Also take Benadryl  50 mg 1 hour prior to CT 05/14/24   Debby Olam POUR, NP  prochlorperazine  (COMPAZINE ) 10 MG tablet Take 1 tablet (10 mg total) by mouth every 6 (six) hours as needed. 04/14/24   Cloretta Arley NOVAK, MD    Allergies: Iohexol     Review of Systems  Updated Vital Signs BP (!) 127/54   Pulse 97   Temp 97.9 F (36.6 C) (Oral)   Resp 18   Ht 5' 6 (1.676 m)   Wt 79.3 kg   LMP 02/07/2016 (Exact Date)   SpO2 100%   BMI 28.23 kg/m   Physical Exam Vitals and nursing note reviewed.  Constitutional:      General: She is not in acute distress.    Appearance: She is well-developed. She is ill-appearing and diaphoretic.     Comments: Vomiting   HENT:     Head: Normocephalic and atraumatic.  Eyes:     Conjunctiva/sclera: Conjunctivae normal.  Cardiovascular:     Rate and Rhythm: Regular rhythm. Tachycardia present.     Heart sounds: Normal heart sounds. No murmur heard.    No friction rub. No gallop.  Pulmonary:     Effort: Pulmonary effort is normal. No respiratory distress.     Breath sounds: Normal breath sounds. No wheezing or rales.  Abdominal:     General: There is no distension.     Palpations: Abdomen is soft.     Tenderness: There is no abdominal tenderness. There is no guarding.  Musculoskeletal:        General: No tenderness.     Cervical back: Normal range of motion.  Skin:    General: Skin is warm.     Findings: No erythema or rash.  Neurological:     Mental Status: She is alert and oriented to person, place, and time.     (all labs ordered are listed, but only abnormal results are displayed) Labs Reviewed  CBC WITH DIFFERENTIAL/PLATELET -  Abnormal; Notable for the following components:      Result Value   RDW 19.8 (*)    nRBC 2.2 (*)    Abs Immature Granulocytes 0.18 (*)    All other components within normal limits  COMPREHENSIVE METABOLIC PANEL WITH GFR - Abnormal; Notable for the following components:   Potassium 3.2 (*)    CO2 19 (*)    Glucose, Bld 227 (*)    Anion gap 18 (*)    All other components within normal limits  CBG MONITORING, ED - Abnormal; Notable for the following components:  Glucose-Capillary 364 (*)    All other components within normal limits  URINALYSIS, ROUTINE W REFLEX MICROSCOPIC    EKG: None  Radiology: No results found.  {Document cardiac monitor, telemetry assessment procedure when appropriate:32947} Procedures   Medications Ordered in the ED  EPINEPHrine  (EPI-PEN) injection 0.3 mg (0.3 mg Intramuscular Given 05/25/24 1632)  methylPREDNISolone  sodium succinate (SOLU-MEDROL ) 125 mg/2 mL injection 125 mg (125 mg Intravenous Given 05/25/24 1632)  diphenhydrAMINE  (BENADRYL ) injection 25 mg (25 mg Intravenous Given 05/25/24 1632)  famotidine  (PEPCID ) IVPB 20 mg premix (0 mg Intravenous Stopped 05/25/24 1807)  lactated ringers  bolus 1,000 mL (0 mLs Intravenous Stopped 05/25/24 1826)      {Click here for ABCD2, HEART and other calculators REFRESH Note before signing:1}                               58 year old female with a history of pancreatic cancer, polycystic astrocytoma of the dorsal midbrain followed at [facility]//endoscopic third ventriculostomy Feb 12, 2024, hypertension, portal vein stenosis/occlusion for which she was started on Eliquis  in April 2025 who presents with concern for allergic reaction to IV contrast in the CT scanner.  Denies having any symptoms prior to her CT.  Her presentation with known allergy to contrast with hypotension, itching, nausea, vomiting and dyspnea is consistent with anaphylaxis to IV contrast.  She was given an EpiPen  and taken immediately to the resuscitation  room.  She was given IV fluid, additional Benadryl , famotidine , and Solu-Medrol . She was also given zofran .  Due to her lightheadedness and diaphoresis, a glucose was also checked and found to be in the 300s.  Given this, labs were obtained to evaluate for other complications of diabetes.  Labs completed and personally by interpreted by me show no anemia, no leukocytosis, hyperglycemia of 227, mild hypokalemia, bicarb of 19, with anion gap of 18.  Overall do not feel these are consistent with DKA.  She is feeling improved following her EpiPen .  Have updated her contrast allergy to include anaphylaxis.  Observed in the emergency department for 4 hours.    {Document critical care time when appropriate  Document review of labs and clinical decision tools ie CHADS2VASC2, etc  Document your independent review of radiology images and any outside records  Document your discussion with family members, caretakers and with consultants  Document social determinants of health affecting pt's care  Document your decision making why or why not admission, treatments were needed:32947:::1}   Final diagnoses:  Anaphylaxis, initial encounter    ED Discharge Orders     None

## 2024-05-25 NOTE — ED Notes (Signed)
 Transport to ED resus room. Administered Benadryl  25 mg IV, Solumedrol 125 mg IV, and Zofran  4 mg IV RBVO Dr. Dreama at bedside.

## 2024-05-25 NOTE — ED Notes (Signed)
 Port de-accessed, flushed with Heparin  100units/59ml, covered with a gauze dressing

## 2024-05-25 NOTE — ED Notes (Signed)
 Patient with wet clothing. Wet clothes and bed linens removed. Assisted patient into dry gown and covered with warm, dry blankets. Call bell within reach.

## 2024-05-25 NOTE — Patient Instructions (Signed)

## 2024-05-26 ENCOUNTER — Telehealth: Payer: Self-pay

## 2024-05-26 NOTE — Telephone Encounter (Signed)
 She is doing well; no issues to report.

## 2024-05-26 NOTE — Telephone Encounter (Signed)
-----   Message from Olam Ned sent at 05/26/2024  1:13 PM EDT ----- Please call and check on her.  She had an allergic reaction to the CT contrast yesterday.

## 2024-05-27 ENCOUNTER — Inpatient Hospital Stay

## 2024-05-27 ENCOUNTER — Inpatient Hospital Stay: Attending: Oncology

## 2024-05-27 ENCOUNTER — Encounter: Payer: Self-pay | Admitting: Nurse Practitioner

## 2024-05-27 ENCOUNTER — Inpatient Hospital Stay (HOSPITAL_BASED_OUTPATIENT_CLINIC_OR_DEPARTMENT_OTHER): Admitting: Nurse Practitioner

## 2024-05-27 VITALS — BP 135/68 | HR 57 | Temp 97.8°F | Resp 16 | Wt 176.8 lb

## 2024-05-27 VITALS — BP 146/67 | HR 62 | Temp 97.9°F | Resp 18

## 2024-05-27 DIAGNOSIS — C251 Malignant neoplasm of body of pancreas: Secondary | ICD-10-CM

## 2024-05-27 DIAGNOSIS — D6959 Other secondary thrombocytopenia: Secondary | ICD-10-CM | POA: Diagnosis not present

## 2024-05-27 DIAGNOSIS — Z23 Encounter for immunization: Secondary | ICD-10-CM | POA: Diagnosis not present

## 2024-05-27 DIAGNOSIS — Z5111 Encounter for antineoplastic chemotherapy: Secondary | ICD-10-CM | POA: Diagnosis present

## 2024-05-27 DIAGNOSIS — R978 Other abnormal tumor markers: Secondary | ICD-10-CM | POA: Diagnosis not present

## 2024-05-27 LAB — CBC WITH DIFFERENTIAL (CANCER CENTER ONLY)
Abs Immature Granulocytes: 0.09 K/uL — ABNORMAL HIGH (ref 0.00–0.07)
Basophils Absolute: 0 K/uL (ref 0.0–0.1)
Basophils Relative: 0 %
Eosinophils Absolute: 0 K/uL (ref 0.0–0.5)
Eosinophils Relative: 0 %
HCT: 32.1 % — ABNORMAL LOW (ref 36.0–46.0)
Hemoglobin: 10.6 g/dL — ABNORMAL LOW (ref 12.0–15.0)
Immature Granulocytes: 1 %
Lymphocytes Relative: 21 %
Lymphs Abs: 2.2 K/uL (ref 0.7–4.0)
MCH: 31.2 pg (ref 26.0–34.0)
MCHC: 33 g/dL (ref 30.0–36.0)
MCV: 94.4 fL (ref 80.0–100.0)
Monocytes Absolute: 1 K/uL (ref 0.1–1.0)
Monocytes Relative: 9 %
Neutro Abs: 7.1 K/uL (ref 1.7–7.7)
Neutrophils Relative %: 69 %
Platelet Count: 102 K/uL — ABNORMAL LOW (ref 150–400)
RBC: 3.4 MIL/uL — ABNORMAL LOW (ref 3.87–5.11)
RDW: 20.1 % — ABNORMAL HIGH (ref 11.5–15.5)
WBC Count: 10.4 K/uL (ref 4.0–10.5)
nRBC: 0.2 % (ref 0.0–0.2)

## 2024-05-27 LAB — CMP (CANCER CENTER ONLY)
ALT: 17 U/L (ref 0–44)
AST: 22 U/L (ref 15–41)
Albumin: 3.6 g/dL (ref 3.5–5.0)
Alkaline Phosphatase: 80 U/L (ref 38–126)
Anion gap: 10 (ref 5–15)
BUN: 12 mg/dL (ref 6–20)
CO2: 27 mmol/L (ref 22–32)
Calcium: 9.4 mg/dL (ref 8.9–10.3)
Chloride: 107 mmol/L (ref 98–111)
Creatinine: 0.8 mg/dL (ref 0.44–1.00)
GFR, Estimated: >60 mL/min (ref >60)
Glucose, Bld: 87 mg/dL (ref 70–99)
Potassium: 3.4 mmol/L — ABNORMAL LOW (ref 3.5–5.1)
Sodium: 144 mmol/L (ref 135–145)
Total Bilirubin: 0.4 mg/dL (ref 0.0–1.2)
Total Protein: 6.3 g/dL — ABNORMAL LOW (ref 6.5–8.1)

## 2024-05-27 MED ORDER — PALONOSETRON HCL INJECTION 0.25 MG/5ML
0.2500 mg | Freq: Once | INTRAVENOUS | Status: AC
  Administered 2024-05-27: 0.25 mg via INTRAVENOUS
  Filled 2024-05-27: qty 5

## 2024-05-27 MED ORDER — PACLITAXEL PROTEIN-BOUND CHEMO INJECTION 100 MG
100.0000 mg/m2 | Freq: Once | INTRAVENOUS | Status: AC
  Administered 2024-05-27: 200 mg via INTRAVENOUS
  Filled 2024-05-27: qty 40

## 2024-05-27 MED ORDER — SODIUM CHLORIDE 0.9 % IV SOLN
800.0000 mg/m2 | Freq: Once | INTRAVENOUS | Status: AC
  Administered 2024-05-27: 1520 mg via INTRAVENOUS
  Filled 2024-05-27: qty 25.98

## 2024-05-27 MED ORDER — SODIUM CHLORIDE 0.9 % IV SOLN: Freq: Once | INTRAVENOUS | Status: AC

## 2024-05-27 MED ORDER — DEXAMETHASONE SODIUM PHOSPHATE 10 MG/ML IJ SOLN
10.0000 mg | Freq: Once | INTRAMUSCULAR | Status: AC
  Administered 2024-05-27: 10 mg via INTRAVENOUS
  Filled 2024-05-27: qty 1

## 2024-05-27 NOTE — Progress Notes (Signed)
 [facility] OFFICE PROGRESS NOTE   Diagnosis:  Pancreas cancer  INTERVAL HISTORY:   Ms. Casella returns as scheduled. She completed another cycle of Gemcitabine /abraxane  05/14/2024. She had restaging CTS on 05/25/2024. She had an anaphylactic reaction to the IV contrast in the scanner. She was evaluated/treated in the emergency department.   At present she feels well.  She reports tolerating most recent chemotherapy without difficulty.  No nausea/vomiting.  No mouth sores.  No change in intermittent loose stools.  No fever or rash after treatment.  No persistent neuropathy symptoms.  Objective:  Vital signs in last 24 hours:  Blood pressure 135/68, pulse (!) 57, temperature 97.8 F (36.6 C), temperature source Temporal, resp. rate 16, weight 176 lb 12.8 oz (80.2 kg), last menstrual period 02/07/2016, SpO2 97%.    HEENT: No thrush or ulcers. Resp: Lungs clear bilaterally. Cardio: Regular rate and rhythm. GI: No hepatosplenomegaly.  No mass.  Nontender. Vascular: No leg edema. Port-A-Cath without erythema.  Lab Results:  Lab Results  Component Value Date   WBC 10.4 05/27/2024   HGB 10.6 (L) 05/27/2024   HCT 32.1 (L) 05/27/2024   MCV 94.4 05/27/2024   PLT 102 (L) 05/27/2024   NEUTROABS 7.1 05/27/2024    Imaging:  CT CHEST ABDOMEN PELVIS W CONTRAST Result Date: 05/26/2024 CLINICAL DATA:  Pancreatic cancer. Assess treatment response. * Tracking Code: BO * EXAM: CT CHEST, ABDOMEN, AND PELVIS WITH CONTRAST TECHNIQUE: Multidetector CT imaging of the chest, abdomen and pelvis was performed following the standard protocol during bolus administration of intravenous contrast. RADIATION DOSE REDUCTION: This exam was performed according to the departmental dose-optimization program which includes automated exposure control, adjustment of the mA and/or kV according to patient size and/or use of iterative reconstruction technique. CONTRAST:  85mL OMNIPAQUE  IOHEXOL  300 MG/ML  SOLN  COMPARISON:  Prior CT scans from 01/06/2024 and 02/08/2024. FINDINGS: CT CHEST FINDINGS Cardiovascular: The heart is in size. No pericardial effusion. The aorta is normal in. No dissection. Stable scattered atherosclerotic calcifications. No obvious coronary artery calcifications. Mediastinum/Nodes: No mediastinal or hilar mass or lymphadenopathy. The esophagus is grossly. Lungs/Pleura: Stable right middle lobe and right lower lobe pulmonary lesions. No new pulmonary lesions. No pleural effusions or pleural lesions. No acute pulmonary process. Musculoskeletal: No breast masses supraclavicular or axillary. CT ABDOMEN PELVIS FINDINGS Hepatobiliary: No hepatic lesions to suggest metastatic disease. The gallbladder is surgically absent. No intrahepatic biliary dilatation. Pancreas: Vague low-attenuation lesion in the upper pancreatic head/body junction region measuring approximately 2.2 x 1.9 cm. This is smaller when compared to the prior contrast enhanced CT scan from April where it measured approximately 3.7 x 2.0 cm. The tumor extending down along and surrounding the celiac axis measures approximately 2.7 x 1.8 cm and previously measured 3.3 x 2.9 cm. Associated atrophy of the pancreatic body and tail and dilatation of the main pancreatic. Spleen: Normal in size without focal abnormality. Adrenals/Urinary Tract: Adrenal glands and kidneys and stable. Stomach/Bowel: The stomach, duodenum small and colon are grossly normal stable. No inflammatory or obstructive findings. Vascular/Lymphatic: Stable atherosclerotic calcifications involving the aorta and iliac arteries. No aneurysm dissection. No adenopathy. Reproductive: Stable fibroid uterus.  No adnexal mass. Other: No pelvic mass or adenopathy. No free pelvic fluid collections. No inguinal mass or adenopathy. No abdominal wall hernia or subcutaneous lesions. Musculoskeletal: No significant bony findings. IMPRESSION: 1. Stable right middle lobe and right lower lobe  pulmonary lesions. No new pulmonary lesions. 2. Decrease in size of the pancreatic head/body junction  lesion and the tumor extending down along the celiac axis. 3. No findings for hepatic metastatic disease. 4. Stable fibroid uterus. 5. Aortic atherosclerosis. Aortic Atherosclerosis (ICD10-I70.0). Electronically Signed   By: MYRTIS Stammer M.D.   On: 05/26/2024 14:26    Medications: I have reviewed the patient's current medications.  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 CT abdomen/pelvis 07/30/2022-body/neck pancreas mass with peripheral pancreatic atrophy and duct dilation, suspected encasement of the celiac axis, uterine fibroids MRI abdomen 07/30/2022-3.7 x 4.7 cm pancreas body/tail mass with encasement of the proximal celiac artery, occlusion with collateralization at the confluence of the SMV and portal vein, no evidence of metastatic disease CT chest 08/01/2022-no intrathoracic metastases Elevated CA 19-9 EUS 08/02/2022-gastric polyp, gastric erosions, 32 x 33 mm pancreas body mass, invasion of the celiac trunk, invasion of the splenoportal confluence, no SMA invasion, 2 malignant appearing nodes in the porta hepatis,T4N1 by endoscopic criteria, FNA biopsy-adenocarcinoma 08/17/2022 [employer]-tumor mutation burden 0.96; MSI high not detected; 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  Stable porta hepatis lymph nodes. Cycle 7 gemcitabine /Abraxane  11/09/2022 Cycle 8 gemcitabine /Abraxane  11/23/2022 Cycle 9 gemcitabine /Abraxane  12/07/2022 Cycle 10 gemcitabine /Abraxane  12/21/2022 Cycle 11  gemcitabine /Abraxane  01/04/2023 Cycle 12 gemcitabine /Abraxane  01/18/2023 Cycle 13 gemcitabine /Abraxane  02/01/2023 Cycle 14 gemcitabine /Abraxane  02/15/2023 CT abdomen/pelvis 02/26/2023-stable appearance of pancreas body mass with vascular encasement and chronic occlusion of the splenic vein, no evidence of metastatic disease Cycle 15 gemcitabine /Abraxane  03/01/2023 Cycle 16 gemcitabine /Abraxane  03/15/2023 Cycle 17 Gemcitabine /Abraxane  03/29/2023 EUS pancreas fiducial marker placement 04/17/2023 Pancreas SBRT 05/07/2023 - 05/17/2023, 33 Gray in 5 fractions CT abdomen/pelvis 09/14/2023: Slight decrease in pancreas body mass with persistent vascular encasement, no evidence of metastatic disease CTs [facility] 01/02/2024-ill-defined hypoattenuating mass in the mid pancreatic body surrounding surgical clips.  Pancreatic duct within the distal pancreatic body and tail is dilated.  Mass encases the celiac trunk.  Common bile duct is dilated with suggestion of focal stenosis in the mid bile duct.  High-grade stenosis/occlusion of the portal vein.  Prominent vessels in the upper abdomen.  Heterogeneous appearance of the splenic parenchyma with suggestion of an infiltrating hypoattenuating mass/masses versus sequela of splenic infarcts.  Multiple pulmonary nodules in the lung bases concerning for metastatic disease. CTs chest/abdomen/pelvis 01/06/2024-no PE.  Cardiomegaly with small pericardial effusion.  Small irregular focus of density medial right lung base.  Poorly defined pancreatic body mass measuring 3 x 2.2 cm, previously 3 x 2.5 cm, possible extension of infiltrative soft tissue towards the pancreatic neck region with ill-defined density at the porta hepatis compared to prior.  Slight increased extrahepatic biliary and common duct dilatation compared to prior.  No focal splenic abnormality. Cycle 1 gemcitabine /abraxane  02/07/2024 Cycle 2 gemcitabine /Abraxane  03/18/2024 Cycle 3 gemcitabine /Abraxane   04/01/2024, premed changed from Compazine  to Zofran  Cycle 4 gemcitabine /Abraxane  04/14/2024, premedication changed to Aloxi /Decadron  Cycle 5 gemcitabine /Abraxane  04/29/2024, premedication continued Aloxi /Decadron  Cycle 6 gemcitabine /Abraxane  05/14/2024, premedication continued Aloxi /Decadron  CTs 05/25/2024-stable right middle lobe and right lower lobe pulmonary lesions. No new pulmonary lesions. Decrease in size of pancreatic head/body junction lesion and tumor extending down along the celiac axis. No findings for hepatic metastatic disease.  Cycle 7 gemcitabine /Abraxane  05/27/2024, Gemcitabine  dose reduced due to thrombocytopenia     2.  Pilocystic astrocytoma of the dorsal midbrain-biopsy 03/17/2020, followed at  [facility] with observation Brain MRI 02/09/2024-interval development of hydrocephalus with transependymal flow of CSF.  Similar size of of enhancing mass in the dorsal right midbrain effacing the cerebral aqueduct.  No new enhancing lesions identified.  She was transferred to [facility].  She underwent an endoscopic third ventriculostomy on 02/12/2024.   Brain CT 03/13/2024-interval resolution of obstructive hydrocephalus and transependymal edema.  Mild interval decrease in size of a mass within the right midbrain, compatible with glioma.  Chronic encephalomalacia changes in the right frontal lobe. 02/12/2024: Endoscopic [facility]ulostomy at [facility]   3.  Anorexia/weight loss secondary to #1 4.  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  Abdominal pain 6.  History of colon polyps 7.  Family history of multiple cancers Pancreas cancer maternal aunt, maternal cousin, maternal grandfather Breast cancer-maternal aunt, maternal cousin Prostate cancer-maternal uncle Lung cancer-father 8.  Hypertension 9.  Postmenopausal vaginal bleeding 10.  H. pylori 08/02/2022-amoxicillin  and Flagyl  discontinued 08/21/2022 due to GI symptoms 11.  Syncope event 03/07/2023 12.  Eliquis  initiated 01/02/2024 for high-grade  stenosis/occlusion of the portal vein 13.  Anaphylaxis following CT contrast 05/25/2024    Disposition: Kathleen Stone appears stable.  She has completed 6 cycles of gemcitabine /Abraxane .  She continues to tolerate treatment well.  Recent restaging CTs stable to improved.  Results/images reviewed with her at today's visit.  She agrees with the recommendation to continue gemcitabine /Abraxane .  Proceed with cycle 7 today as scheduled.  Gemcitabine  will be dose reduced due to thrombocytopenia.  She understands to contact the office with bleeding.  She had an anaphylactic reaction to IV CT contrast.  This has been updated on her allergy list.  She will return for follow-up and treatment in 2 weeks.  We are available to see her sooner if needed.  Patient seen with Dr. Cloretta.  Olam Ned ANP/GNP-BC   05/27/2024  8:54 AM This was a shared visit with Olam Ned.  We reviewed the CT findings and images with Ms. Martos.  Her clinical status and the CA 19-9 are improved after resuming gemcitabine /Abraxane .  The restaging CT is consistent with a response to therapy.  I recommend continuing gemcitabine /Abraxane .  I was present for greater than 50% of today's visit.  I performed medical decision making.  Arvella Cloretta, MD

## 2024-05-27 NOTE — Progress Notes (Signed)
 Patient seen by Olam Ned NP today  Vitals are within treatment parameters:Yes   Labs are within treatment parameters: Yes   Treatment plan has been signed: Yes   Per physician team, Patient is ready for treatment. Please note the following modifications: Gemcitabine  will be dose reduced due to thrombocytopenia . Please do not release the order until the provider change the order.

## 2024-05-27 NOTE — Patient Instructions (Signed)

## 2024-05-27 NOTE — Patient Instructions (Signed)
 [facility] - A DEPT OF [facility]  Discharge Instructions: Thank you for choosing [facility] to provide your oncology and hematology care.   If you have a lab appointment with the Cancer Center, please go directly to the Cancer Center and check in at the registration area.   Wear comfortable clothing and clothing appropriate for easy access to any Portacath or PICC line.   We strive to give you quality time with your provider. You may need to reschedule your appointment if you arrive late (15 or more minutes).  Arriving late affects you and other patients whose appointments are after yours.  Also, if you miss three or more appointments without notifying the office, you may be dismissed from the clinic at the provider's discretion.      For prescription refill requests, have your pharmacy contact our office and allow 72 hours for refills to be completed.    Today you received the following chemotherapy and/or immunotherapy agents: Paclitaxel  protein-bound (Abraxane ) and Gemcitabine  (Gemzar )      To help prevent nausea and vomiting after your treatment, we encourage you to take your nausea medication as directed.  BELOW ARE SYMPTOMS THAT SHOULD BE REPORTED IMMEDIATELY: *FEVER GREATER THAN 100.4 F (38 C) OR HIGHER *CHILLS OR SWEATING *NAUSEA AND VOMITING THAT IS NOT CONTROLLED WITH YOUR NAUSEA MEDICATION *UNUSUAL SHORTNESS OF BREATH *UNUSUAL BRUISING OR BLEEDING *URINARY PROBLEMS (pain or burning when urinating, or frequent urination) *BOWEL PROBLEMS (unusual diarrhea, constipation, pain near the anus) TENDERNESS IN MOUTH AND THROAT WITH OR WITHOUT PRESENCE OF ULCERS (sore throat, sores in mouth, or a toothache) UNUSUAL RASH, SWELLING OR PAIN  UNUSUAL VAGINAL DISCHARGE OR ITCHING   Items with * indicate a potential emergency and should be followed up as soon as possible or go to the Emergency Department if any problems should occur.  Please  show the CHEMOTHERAPY ALERT CARD or IMMUNOTHERAPY ALERT CARD at check-in to the Emergency Department and triage nurse.  Should you have questions after your visit or need to cancel or reschedule your appointment, please contact [facility] - A DEPT OF [facility]  Dept: [phone]  and follow the prompts.  Office hours are 8:00 a.m. to 4:30 p.m. Monday - Friday. Please note that voicemails left after 4:00 p.m. may not be returned until the following business day.  We are closed weekends and major holidays. You have access to a nurse at all times for urgent questions. Please call the main number to the clinic Dept: [phone] and follow the prompts.   For any non-urgent questions, you may also contact your provider using MyChart. We now offer e-Visits for anyone 31 and older to request care online for non-urgent symptoms. For details visit [email or web address].   Also download the MyChart app! Go to the app store, search MyChart, open the app, select [place], and log in with your MyChart username and password.

## 2024-05-28 ENCOUNTER — Other Ambulatory Visit: Payer: Self-pay

## 2024-05-28 LAB — CANCER ANTIGEN 19-9: CA 19-9: 450 U/mL — ABNORMAL HIGH (ref 0–35)

## 2024-06-03 ENCOUNTER — Other Ambulatory Visit: Payer: Self-pay

## 2024-06-03 NOTE — Progress Notes (Signed)
 BH MD Outpatient Progress Note  06/04/2024 5:19 PM Kathleen Stone  MRN:  [id number]  Assessment:  [name], B, presents for follow-up evaluation on 06/04/24 .   The patient is reporting ongoing insomnia, reporting sleeping on average 3-4 hours nightly.  Based on the patient's subjective report, her poor sleep is likely exacerbated by several habits, including smoking and keeping lights on at bedtime. Before considering any titration or modification of Remeron , CBT-I strategies were discussed to improve her sleep quality. The patient is also amenable to starting melatonin, with a recommendation to begin at 5 mg nightly. Resources on third-party tested supplements were provided.  The patient describes auditory and visual hallucinations occurring primarily in the evening when falling asleep, most consistent with hypnagogic and hypnopompic phenomena. No overt psychotic features are observed on exam today. In the context of ongoing chemotherapy and medical complexity, the risks of antipsychotic initiation outweigh potential benefit at this time.    Identifying Information: Kathleen, Stone,  is a 58 y.o. female with a history of psychosis, insomnia and appetite issues who is an established patient with [facility] for management of poor appetite  and insomnia. She was formerly a patient of Dr. Rainelle.  For a comprehensive history and detailed assessment, please refer to the initial adult assessment.  The patient sees Cozart, Paige Y, LCSW  for therapy.  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  Plan:  # Hx of psychosis #Hypnagogic hallucinations #Insomnia #Appetite suppression Past medication trials:  Status of problem: unchanged Interventions: -- Continue Remeron  7.5 mg nightly for insomnia and appetite  suppression.  --consider titration at next visit -- Start melatonin 5 mg nightly -- Encourage CBT-i, resources provided -- Continue therapy with Paige  Patient was given contact information for behavioral health clinic and was instructed to call 911 for emergencies.   Subjective:  Chief Complaint:  Chief Complaint  Patient presents with   Follow-up   Medication Refill    Interval History:  The patient presented to the clinic and we inially discussed an episode of anaphylaxis she experienced earlier in the month. The patient reports she has been doing relatively well while continuing her chemotherapy regimen. She denies any adverse effects to her current medications and states she has remained compliant on Remeron  7.5 mg nightly.  Regarding sleep, she describes persistent difficulty despite the use of Remeron , typically sleeping only from 1 am to 4 am. She reports poor sleep hygiene, including smoking and keeping lights on at bedtime. Behavioral strategies for improving sleep were discussed during the encounter.  She notes that her appetite is generally adequate but can be intermittently affected by chemotherapy. She denies any suicidal ideation.  The patient describes auditory and visual hallucinations occurring exclusively at bedtime, specifically hearing horses and perceiving a presence in the sheets when she lies down. She reports attempting to communicate with this presence, but these experiences are limited to the period when she is trying to fall asleep.  Visit Diagnosis:    ICD-10-CM   1. Insomnia, unspecified type  G47.00 mirtazapine  (REMERON ) 7.5 MG tablet    2. Hypnagogic hallucinations  R44.2       Past Psychiatric History:  Diagnoses: Denies Medication trials: Cymbalta  Previous psychiatrist/therapist: [name], LCSW. Previously saw Renda Pontes for 2-3 months.  Hospitalizations: Denies Suicide attempts: Denies SIB: Denies Hx of violence towards others:  Denies Current access to guns: Yes,  locked.  Hx of trauma/abuse: Denies Head trauma/Seizures: Denies  Social History   Socioeconomic History   Marital status: Legally Separated    Spouse name: Not on file   Number of children: Not on file   Years of education: Not on file   Highest education level: Not on file  Occupational History   Occupation: unemployed - applied for disability  Tobacco Use   Smoking status: Every Day    Current packs/day: 1.50    Average packs/day: 1.5 packs/day for 36.0 years (54.0 ttl pk-yrs)    Types: Cigarettes   Smokeless tobacco: Never  Vaping Use   Vaping status: Former  Substance and Sexual Activity   Alcohol use: No    Alcohol/week: 0.0 standard drinks of alcohol   Drug use: No   Sexual activity: Yes    Partners: Female  Other Topics Concern   Not on file  Social History Narrative   Domestic Partner: female has emphysema   Smoked 2 packs/day for 20+ years, down to 1/2ppd now, willing to try to quit    Alcohol use-no   Drug use-no   Regular exercise-no   Social Drivers of Health   Financial Resource Strain: Low Risk  (02/11/2024)   [facility]   Overall Financial Resource Strain (CARDIA)    Difficulty of Paying Living Expenses: Not very hard  Food Insecurity: No Food Insecurity (04/02/2024)   Hunger Vital Sign    Worried About Running Out of Food in the Last Year: Never true    Ran Out of Food in the Last Year: Never true  Transportation Needs: No Transportation Needs (04/02/2024)   PRAPARE - Administrator, Civil Service (Medical): No    Lack of Transportation (Non-Medical): No  Physical Activity: Not on file  Stress: Not on file  Social Connections: Not on file    Allergies:  Allergies  Allergen Reactions   Iohexol  Anaphylaxis, Hives and Other (See Comments)    05/25/24 - Pt given contrast for CT after 13 hr prep, had reaction and needed care in ER per MD evaluation.     Current  Medications: Current Outpatient Medications  Medication Sig Dispense Refill   Melatonin 5 MG CAPS Take 1 capsule (5 mg total) by mouth at bedtime.     acetaminophen  (TYLENOL ) 325 MG tablet Take by mouth every 6 (six) hours as needed. (Patient not taking: Reported on 05/27/2024)     albuterol  (VENTOLIN  HFA) 108 (90 Base) MCG/ACT inhaler Inhale 2 puffs into the lungs every 6 (six) hours as needed for wheezing or shortness of breath. 6.7 g 1   amLODipine  (NORVASC ) 5 MG tablet Take 1 tablet (5 mg total) by mouth daily. 30 tablet 11   apixaban  (ELIQUIS ) 5 MG TABS tablet Take 1 tablet (5 mg total) by mouth 2 (two) times daily. (Patient not taking: Reported on 05/27/2024) 60 tablet 3   dexamethasone  (DECADRON ) 4 MG tablet Take 1 tablet (4 mg total) by mouth 2 (two) times daily. Take one tablet twice daily x 3 days after chemo. Start on 04/15/2024 6 tablet 0   EPINEPHrine  0.3 mg/0.3 mL IJ SOAJ injection Inject 0.3 mg into the muscle as needed for anaphylaxis. 1 each 0   lidocaine -prilocaine  (EMLA ) cream Apply 1 Application topically as needed. 30 g 0   lipase/protease/amylase (CREON ) 36000 UNITS CPEP capsule Take 2 capsules (72,000 Units total) by mouth 3 (three) times daily with meals. (Patient not taking: Reported on 05/27/2024) 180 capsule 4  loperamide (IMODIUM) 2 MG capsule Take 2-4 mg by mouth as needed for diarrhea or loose stools. Maximum 8/day (Patient not taking: Reported on 05/27/2024)     mirtazapine  (REMERON ) 7.5 MG tablet Take 1 tablet (7.5 mg total) by mouth at bedtime. 30 tablet 1   nitroGLYCERIN  (NITROSTAT ) 0.4 MG SL tablet Place 1 tablet (0.4 mg total) under the tongue every 5 (five) minutes as needed for chest pain. 25 tablet 1   ondansetron  (ZOFRAN ) 8 MG tablet Take 1 tablet (8 mg total) by mouth every 8 (eight) hours as needed. 30 tablet 1   pantoprazole  (PROTONIX ) 40 MG tablet Take 1 tablet (40 mg total) by mouth daily. (Patient not taking: Reported on 05/27/2024) 30 tablet 1   potassium  chloride SA (KLOR-CON  M) 20 MEQ tablet Take 1 tablet (20 mEq total) by mouth daily. 30 tablet 2   prochlorperazine  (COMPAZINE ) 10 MG tablet Take 1 tablet (10 mg total) by mouth every 6 (six) hours as needed. 30 tablet 1   No current facility-administered medications for this visit.    ROS: Review of Systems  All other systems reviewed and are negative.   Objective:  Objective: Psychiatric Specialty Exam: General Appearance: Casual, fairly groomed  Eye Contact:  Good    Speech:  Clear, coherent, normal rate, spontaneous  Volume:  Normal   Mood:  see above  Affect:  Appropriate, congruent, full range  Thought Content: Logical, goal-oriented  Suicidal Thoughts: see subjective  Thought Process:  Coherent, goal-directed  Orientation:  A&Ox4   Memory:  Immediate good  Judgment:  Good  Insight:  Good  Concentration:  Attention and concentration good   Recall:  Good  Fund of Knowledge: Good  Language: Good, fluent  Psychomotor Activity: Normal  Akathisia:  NA   AIMS (if indicated): NA   Assets:   Communication Skills Desire for Improvement Housing Resilience  ADL's:  Intact  Cognition: WNL  Sleep: see above  Appetite: see above    Physical Exam Vitals reviewed.  Constitutional:      General: She is not in acute distress.    Appearance: She is not ill-appearing.  HENT:     Head: Normocephalic and atraumatic.  Eyes:     Extraocular Movements: Extraocular movements intact.  Pulmonary:     Effort: Pulmonary effort is normal. No respiratory distress.  Skin:    General: Skin is warm and dry.      Metabolic Disorder Labs: Lab Results  Component Value Date   HGBA1C 4.9 09/09/2023   MPG 93.93 09/09/2023   MPG 119.76 11/16/2021   Lab Results  Component Value Date   PROLACTIN 7.7 10/21/2007   Lab Results  Component Value Date   CHOL 174 11/16/2021   TRIG 149 11/16/2021   HDL 38 (L) 11/16/2021   CHOLHDL 4.6 11/16/2021   VLDL 30 11/16/2021   LDLCALC 106 (H)  11/16/2021   LDLCALC 124 (H) 06/13/2020   Lab Results  Component Value Date   TSH 0.552 05/02/2022   TSH 2.164 11/16/2021    Therapeutic Level Labs: No results found for: LITHIUM No results found for: VALPROATE No results found for: CBMZ  Screenings:  GAD-7    Flowsheet Row Clinical Support from 06/04/2024 in [facility] Clinical Support from 04/09/2024 in [facility] Counselor from 10/28/2023 in [facility] Office Visit from 11/01/2021 in [facility] - A Dept Of [place]. [facility]  Total GAD-7  Score 1 3 21 19    PHQ2-9    Flowsheet Row Clinical Support from 06/04/2024 in [facility] Office Visit from 05/27/2024 in [facility] - A Dept Of [place]. [facility] Office Visit from 04/29/2024 in [facility] - A Dept Of [place]. [facility] Clinical Support from 04/09/2024 in [facility] Patient Outreach Telephone from 04/02/2024 in [place] POPULATION HEALTH DEPARTMENT  PHQ-2 Total Score 0 0 0 0 1   Flowsheet Row Office Visit from 05/27/2024 in [facility] - A Dept Of [place]. [facility] ED from 05/25/2024 in [facility] Emergency Department at [facility] Office Visit from 05/14/2024 in [facility] - A Dept Of [place]. [facility]  C-SSRS RISK CATEGORY No Risk No Risk No Risk    Collaboration of Care:   Patient/Guardian was advised Release of Information must be obtained prior to any record release in order to collaborate their care with an outside provider. Patient/Guardian was advised if they have not already done so to contact the registration department to sign all necessary forms in order for us  to release information regarding their care.   Consent: Patient/Guardian gives verbal consent for treatment and  assignment of benefits for services provided during this visit. Patient/Guardian expressed understanding and agreed to proceed.    Marlo Masson, MD 06/04/2024, 5:19 PM

## 2024-06-04 ENCOUNTER — Encounter (HOSPITAL_COMMUNITY): Payer: Self-pay | Admitting: Student in an Organized Health Care Education/Training Program

## 2024-06-04 ENCOUNTER — Ambulatory Visit (INDEPENDENT_AMBULATORY_CARE_PROVIDER_SITE_OTHER): Admitting: Student in an Organized Health Care Education/Training Program

## 2024-06-04 VITALS — BP 124/66 | HR 73 | Ht 66.0 in | Wt 175.0 lb

## 2024-06-04 DIAGNOSIS — G4700 Insomnia, unspecified: Secondary | ICD-10-CM | POA: Diagnosis not present

## 2024-06-04 DIAGNOSIS — R442 Other hallucinations: Secondary | ICD-10-CM | POA: Diagnosis not present

## 2024-06-04 MED ORDER — MIRTAZAPINE 7.5 MG PO TABS: 7.5000 mg | ORAL_TABLET | Freq: Every day | ORAL | 1 refills | Status: DC

## 2024-06-04 MED ORDER — MELATONIN 5 MG PO CAPS: 5.0000 mg | ORAL_CAPSULE | Freq: Every evening | ORAL | Status: DC

## 2024-06-04 NOTE — Patient Instructions (Signed)
 Dear Janina,  Most effective treatment for your mental health disease involves BOTH a psychiatrist AND a therapist Psychiatrist to manage medications Therapist to help identify personal goals, barriers from those goals, and plan to achieve those goals by understanding emotions Please make regular appointments with an outpatient psychiatrist and other doctors once you leave the hospital (if any, otherwise, please see below for resources to make an appointment).  For therapy outside the hospital, please ask for these specific types of therapy: DBT ________________________________________________________  SAFETY CRISIS  Dial 988 for National Suicide & Crisis Lifeline    Text [phone] for Crisis Text Line:     [facility] URGENT CARE:  [address] FIRST FLOOR.  [place], [place] [address].  [phone]  Mobile Crisis Response Teams Listed by counties in vicinity of [facility] providers [place] [facility] [phone] [facility] [phone] [facility] [phone] [facility] [phone] [place]                * [facility] [phone]                * [facility] [phone] [facility] [phone] [facility]  [phone] * [facility] [phone] ________________________________________________________  To see which pharmacy near you is the CHEAPEST for certain medications, please use GoodRx. It is free website and has a free phone app.    Also consider looking at [facility] $4.00 or [facility]'s $7.00 prescription list. Both are free to view if googled [facility]4 prescription and [facility]'s $7 prescription. These are set prices, no insurance required. [facility] low cost medications: $4-$15 for 30days  prescriptions or $10-$38 for 90days prescriptions  ________________________________________________________  Difficulties with sleep?   Can also use this free app for insomnia called CBT-I. Let your doctors and therapists know so they can help with extra tips and tricks or for guidance and accountability. NO ADDS on the app.     ________________________________________________________  Non-Emergent / Urgent  [facility] [address] SECOND FLOOR [place], [place] [address] [phone] OUTPATIENT Walk-in information: Please note, all walk-ins are first come & first serve, with limited number of availability.  Please note that to be eligible for services you must bring: ID or a piece of mail with your name [facility] address  Therapist for therapy:  Monday & Wednesdays: Please ARRIVE at 7:15 AM for registration Will START at 8:00 AM Every 1st & 2nd Friday of the month: Please ARRIVE at 10:15 AM for registration Will START at 1 PM - 5 PM  Psychiatrist for medication management: Monday - Friday:  Please ARRIVE at 7:15 AM for registration Will START at 8:00 AM  Regretfully, due to limited availability, please be aware that you may not been seen on the same day as walk-in. Please consider making an appoint or try again. Thank you for your patience and understanding.

## 2024-06-05 ENCOUNTER — Other Ambulatory Visit: Payer: Self-pay | Admitting: *Deleted

## 2024-06-05 ENCOUNTER — Other Ambulatory Visit: Payer: Self-pay

## 2024-06-05 NOTE — Patient Outreach (Signed)
 Complex Care Management   Visit Note  06/05/2024  Name:  Kathleen Stone MRN: [id number] DOB: 1966-03-03  Situation: Referral received for Complex Care Management related to Pancreatic mass. I obtained verbal consent from Patient.  Visit completed with Patient  on the phone  Background:   Past Medical History:  Diagnosis Date   Abnormal stress test 01/16/2017   Brain tumor (benign) (HCC)    DJD (degenerative joint disease) of cervical spine    Galactorrhea of both breasts 2009   R. sided w/ benign papilloma excised in 4/09. L sided in 5/09   Hematuria, microscopic 02/24/2020   Hyperlipidemia    Hypertension    Hypokalemia    LOW BACK PAIN SYNDROME 07/10/2007   Qualifier: Diagnosis of   By: Lanis MD, Christopher         Nausea and vomiting 08/01/2022   Pancreatic cancer [facility])    Personal history of adenomatous colonic polyp    09/2007 - diminutive adenoma [place])   Stroke [facility])    2021- brain tumor bx caused ? stroke-    Tobacco abuse    Unintentional weight loss 05/26/2022    Assessment: Patient continues visits to the oncologist for ongoing treatment and therapies related to her pancreatic mass with an available navigator from the cancer center if needed. Patient continue to manage her care with no additional assistance requested at this time. RNCM will follow up accordingly. Patient Reported Symptoms:  Cognitive Cognitive Status: Able to follow simple commands, Alert and oriented to person, place, and time, Normal speech and language skills   Health Maintenance Behaviors: Annual physical exam  Neurological Neurological Review of Symptoms: No symptoms reported    HEENT HEENT Symptoms Reported: No symptoms reported      Cardiovascular Cardiovascular Symptoms Reported: No symptoms reported Weight: 176 lb (79.8 kg)  Respiratory Respiratory Symptoms Reported: No symptoms reported    Endocrine Endocrine Symptoms Reported: No symptoms reported Is patient diabetic?: No     Gastrointestinal Gastrointestinal Symptoms Reported: No symptoms reported Additional Gastrointestinal Details: ED visit for anaphylaxic allergic reaction to contrast now listed al an allergy. No residual symptoms encountered post discharged. Pt aware and educated on the EpiPen  if needed. Gastrointestinal Management Strategies: Medication therapy    Genitourinary Genitourinary Symptoms Reported: No symptoms reported    Integumentary Integumentary Symptoms Reported: No symptoms reported Additional Integumentary Details: Site on head has healing with no additional symptoms from past surgery Skin Management Strategies: Coping strategies, Routine screening  Musculoskeletal Musculoskelatal Symptoms Reviewed: No symptoms reported        Psychosocial Psychosocial Symptoms Reported: No symptoms reported          There were no vitals filed for this visit.  Medications Reviewed Today     Reviewed by Alvia Olam BIRCH, RN (Registered Nurse) on 06/05/24 at 1017  Med List Status: <None>   Medication Order Taking? Sig Documenting Provider Last Dose Status Informant  acetaminophen  (TYLENOL ) 325 MG tablet [id number]  Take by mouth every 6 (six) hours as needed.  Patient not taking: Reported on 05/27/2024   [provider]  Active   albuterol  (VENTOLIN  HFA) 108 (90 Base) MCG/ACT inhaler [id number] Yes Inhale 2 puffs into the lungs every 6 (six) hours as needed for wheezing or shortness of breath. Cloretta Arley NOVAK, MD  Active   amLODipine  (NORVASC ) 5 MG tablet [id number] Yes Take 1 tablet (5 mg total) by mouth daily. Heddy Barren, DO  Active   apixaban  (ELIQUIS ) 5 MG TABS tablet [id number]  Take 1 tablet (5 mg total) by mouth 2 (two) times daily.  Patient not taking: Reported on 05/27/2024   Heddy Barren, DO  Active            Med Note MERNA, SUSAN L   Fri Feb 21, 2024  9:00 AM) On Hold x 2 weeks  dexamethasone  (DECADRON ) 4 MG tablet [id number] Yes Take 1 tablet (4 mg total) by mouth 2  (two) times daily. Take one tablet twice daily x 3 days after chemo. Start on 04/15/2024 Cloretta Arley NOVAK, MD  Active   EPINEPHrine  0.3 mg/0.3 mL IJ SOAJ injection [id number] Yes Inject 0.3 mg into the muscle as needed for anaphylaxis. Dreama Longs, MD  Active   lidocaine -prilocaine  (EMLA ) cream [id number] Yes Apply 1 Application topically as needed. Cloretta Arley NOVAK, MD  Active   lipase/protease/amylase (CREON ) 36000 UNITS CPEP capsule [id number]  Take 2 capsules (72,000 Units total) by mouth 3 (three) times daily with meals.  Patient not taking: Reported on 05/27/2024   Debby Olam POUR, NP  Active   loperamide (IMODIUM) 2 MG capsule [id number]  Take 2-4 mg by mouth as needed for diarrhea or loose stools. Maximum 8/day  Patient not taking: Reported on 05/27/2024   [provider]  Active   Melatonin 5 MG CAPS [id number] Yes Take 1 capsule (5 mg total) by mouth at bedtime. Carrion-Carrero, Marlo, MD  Active   mirtazapine  (REMERON ) 7.5 MG tablet [id number] Yes Take 1 tablet (7.5 mg total) by mouth at bedtime. Carrion-Carrero, Marlo, MD  Active   nitroGLYCERIN  (NITROSTAT ) 0.4 MG SL tablet [id number] Yes Place 1 tablet (0.4 mg total) under the tongue every 5 (five) minutes as needed for chest pain. Atway, Rayann N, DO  Active Self  ondansetron  (ZOFRAN ) 8 MG tablet [id number] Yes Take 1 tablet (8 mg total) by mouth every 8 (eight) hours as needed. Cloretta Arley NOVAK, MD  Active   pantoprazole  (PROTONIX ) 40 MG tablet [id number]  Take 1 tablet (40 mg total) by mouth daily.  Patient not taking: Reported on 05/27/2024   Cloretta Arley NOVAK, MD  Active   potassium chloride  SA (KLOR-CON  M) 20 MEQ tablet [id number] Yes Take 1 tablet (20 mEq total) by mouth daily. Debby Olam POUR, NP  Active   prochlorperazine  (COMPAZINE ) 10 MG tablet [id number] Yes Take 1 tablet (10 mg total) by mouth every 6 (six) hours as needed. Cloretta Arley NOVAK, MD  Active             Recommendation:   PCP Follow-up Specialty provider  follow-up Appointments recited to patient as verified Continue Current Plan of Care  Follow Up Plan:   Telephone follow up appointment date/time:  06/29/2024 @ 10:00 am   Olam Ku, RN, BSN [place]  [facility], [facility] RN Care Manager Direct Dial: [phone]  Fax: [phone]

## 2024-06-05 NOTE — Patient Instructions (Signed)
 Visit Information  Thank you for taking time to visit with me today. Please don't hesitate to contact me if I can be of assistance to you before our next scheduled appointment.  Your next care management appointment is by telephone on 06/29/2024 at 1000  Please call the care guide team at [phone] if you need to cancel, schedule, or reschedule an appointment.   Please call the Suicide and Crisis Lifeline: 988 call the [place]  National Suicide Prevention Lifeline: 930-450-2962 or TTY: [phone] TTY 734-186-0508) to talk to a trained counselor call 1-800-273-TALK (toll free, 24 hour hotline) if you are experiencing a Mental Health or Behavioral Health Crisis or need someone to talk to.   Olam Ku, RN, BSN [place]  [facility], [facility] RN Care Manager Direct Dial: [phone]  Fax: [phone]

## 2024-06-06 ENCOUNTER — Other Ambulatory Visit: Payer: Self-pay | Admitting: Oncology

## 2024-06-10 ENCOUNTER — Other Ambulatory Visit

## 2024-06-10 ENCOUNTER — Inpatient Hospital Stay (HOSPITAL_BASED_OUTPATIENT_CLINIC_OR_DEPARTMENT_OTHER): Admitting: Oncology

## 2024-06-10 ENCOUNTER — Ambulatory Visit

## 2024-06-10 VITALS — BP 133/75 | HR 70 | Temp 97.9°F | Resp 18 | Ht 66.0 in | Wt 179.0 lb

## 2024-06-10 DIAGNOSIS — Z23 Encounter for immunization: Secondary | ICD-10-CM

## 2024-06-10 DIAGNOSIS — C251 Malignant neoplasm of body of pancreas: Secondary | ICD-10-CM | POA: Diagnosis not present

## 2024-06-10 DIAGNOSIS — Z5111 Encounter for antineoplastic chemotherapy: Secondary | ICD-10-CM | POA: Diagnosis not present

## 2024-06-10 LAB — CBC WITH DIFFERENTIAL (CANCER CENTER ONLY)
Abs Immature Granulocytes: 0.06 K/uL (ref 0.00–0.07)
Basophils Absolute: 0 K/uL (ref 0.0–0.1)
Basophils Relative: 0 %
Eosinophils Absolute: 0.1 K/uL (ref 0.0–0.5)
Eosinophils Relative: 1 %
HCT: 31.5 % — ABNORMAL LOW (ref 36.0–46.0)
Hemoglobin: 10.4 g/dL — ABNORMAL LOW (ref 12.0–15.0)
Immature Granulocytes: 1 %
Lymphocytes Relative: 23 %
Lymphs Abs: 1.7 K/uL (ref 0.7–4.0)
MCH: 31.4 pg (ref 26.0–34.0)
MCHC: 33 g/dL (ref 30.0–36.0)
MCV: 95.2 fL (ref 80.0–100.0)
Monocytes Absolute: 0.7 K/uL (ref 0.1–1.0)
Monocytes Relative: 9 %
Neutro Abs: 4.9 K/uL (ref 1.7–7.7)
Neutrophils Relative %: 66 %
Platelet Count: 139 K/uL — ABNORMAL LOW (ref 150–400)
RBC: 3.31 MIL/uL — ABNORMAL LOW (ref 3.87–5.11)
RDW: 19.8 % — ABNORMAL HIGH (ref 11.5–15.5)
WBC Count: 7.4 K/uL (ref 4.0–10.5)
nRBC: 0 % (ref 0.0–0.2)

## 2024-06-10 LAB — CMP (CANCER CENTER ONLY)
ALT: 13 U/L (ref 0–44)
AST: 19 U/L (ref 15–41)
Albumin: 3.8 g/dL (ref 3.5–5.0)
Alkaline Phosphatase: 90 U/L (ref 38–126)
Anion gap: 9 (ref 5–15)
BUN: 9 mg/dL (ref 6–20)
CO2: 27 mmol/L (ref 22–32)
Calcium: 9.4 mg/dL (ref 8.9–10.3)
Chloride: 107 mmol/L (ref 98–111)
Creatinine: 0.75 mg/dL (ref 0.44–1.00)
GFR, Estimated: >60 mL/min (ref >60)
Glucose, Bld: 102 mg/dL — ABNORMAL HIGH (ref 70–99)
Potassium: 3.3 mmol/L — ABNORMAL LOW (ref 3.5–5.1)
Sodium: 143 mmol/L (ref 135–145)
Total Bilirubin: 0.5 mg/dL (ref 0.0–1.2)
Total Protein: 6.5 g/dL (ref 6.5–8.1)

## 2024-06-10 MED ORDER — PALONOSETRON HCL INJECTION 0.25 MG/5ML
0.2500 mg | Freq: Once | INTRAVENOUS | Status: AC
  Administered 2024-06-10: 0.25 mg via INTRAVENOUS
  Filled 2024-06-10: qty 5

## 2024-06-10 MED ORDER — INFLUENZA VIRUS VACC SPLIT PF (FLUZONE) 0.5 ML IM SUSY
0.5000 mL | PREFILLED_SYRINGE | Freq: Once | INTRAMUSCULAR | Status: AC
  Administered 2024-06-10: 0.5 mL via INTRAMUSCULAR

## 2024-06-10 MED ORDER — SODIUM CHLORIDE 0.9 % IV SOLN
800.0000 mg/m2 | Freq: Once | INTRAVENOUS | Status: AC
  Administered 2024-06-10: 1520 mg via INTRAVENOUS
  Filled 2024-06-10: qty 24.98

## 2024-06-10 MED ORDER — DEXAMETHASONE SODIUM PHOSPHATE 10 MG/ML IJ SOLN
10.0000 mg | Freq: Once | INTRAMUSCULAR | Status: AC
  Administered 2024-06-10: 10 mg via INTRAVENOUS
  Filled 2024-06-10: qty 1

## 2024-06-10 MED ORDER — PACLITAXEL PROTEIN-BOUND CHEMO INJECTION 100 MG
100.0000 mg/m2 | Freq: Once | INTRAVENOUS | Status: AC
  Administered 2024-06-10: 200 mg via INTRAVENOUS
  Filled 2024-06-10: qty 40

## 2024-06-10 MED ORDER — SODIUM CHLORIDE 0.9 % IV SOLN: Freq: Once | INTRAVENOUS | Status: AC

## 2024-06-10 NOTE — Progress Notes (Signed)
 [facility] OFFICE PROGRESS NOTE   Diagnosis: Pancreas cancer  INTERVAL HISTORY:   Kathleen Stone completed another cycle of gemcitabine /Abraxane  on 05/27/2024.  No nausea or neuropathy symptoms.  She reports right abdominal cramps for the past 2 days.  She has intermittent diarrhea.  She is not taking Creon .  She is not taking a potassium supplement.  Objective:  Vital signs in last 24 hours:  Blood pressure 133/75, pulse 70, temperature 97.9 F (36.6 C), temperature source Temporal, resp. rate 18, height 5' 6 (1.676 m), weight 179 lb (81.2 kg), last menstrual period 02/07/2016, SpO2 100%.    HEENT: No thrush or ulcers Resp: Lungs clear bilaterally Cardio: Regular rate and rhythm GI: No hepatosplenomegaly, no mass, mild tenderness in the right upper and mid abdomen Vascular: No leg edema    Portacath/PICC-without erythema  Lab Results:  Lab Results  Component Value Date   WBC 7.4 06/10/2024   HGB 10.4 (L) 06/10/2024   HCT 31.5 (L) 06/10/2024   MCV 95.2 06/10/2024   PLT 139 (L) 06/10/2024   NEUTROABS 4.9 06/10/2024    CMP  Lab Results  Component Value Date   NA 143 06/10/2024   K 3.3 (L) 06/10/2024   CL 107 06/10/2024   CO2 27 06/10/2024   GLUCOSE 102 (H) 06/10/2024   BUN 9 06/10/2024   CREATININE 0.75 06/10/2024   CALCIUM  9.4 06/10/2024   PROT 6.5 06/10/2024   ALBUMIN 3.8 06/10/2024   AST 19 06/10/2024   ALT 13 06/10/2024   ALKPHOS 90 06/10/2024   BILITOT 0.5 06/10/2024   GFRNONAA >60 06/10/2024   GFRAA >60 02/29/2020    Lab Results  Component Value Date   CEA1 27.4 (H) 07/31/2022   RJW800 450 (H) 05/27/2024      Medications: I have reviewed the patient's current medications.   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 CT abdomen/pelvis 07/30/2022-body/neck pancreas mass with peripheral pancreatic atrophy and duct dilation, suspected encasement of the celiac axis, uterine fibroids MRI abdomen 07/30/2022-3.7 x 4.7 cm pancreas  body/tail mass with encasement of the proximal celiac artery, occlusion with collateralization at the confluence of the SMV and portal vein, no evidence of metastatic disease CT chest 08/01/2022-no intrathoracic metastases Elevated CA 19-9 EUS 08/02/2022-gastric polyp, gastric erosions, 32 x 33 mm pancreas body mass, invasion of the celiac trunk, invasion of the splenoportal confluence, no SMA invasion, 2 malignant appearing nodes in the porta hepatis,T4N1 by endoscopic criteria, FNA biopsy-adenocarcinoma 08/17/2022 Guardant360-tumor mutation burden 0.96; MSI high not detected; 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  Stable porta hepatis lymph nodes. Cycle 7 gemcitabine /Abraxane  11/09/2022 Cycle 8 gemcitabine /Abraxane  11/23/2022 Cycle 9 gemcitabine /Abraxane  12/07/2022 Cycle 10 gemcitabine /Abraxane  12/21/2022 Cycle 11 gemcitabine /Abraxane  01/04/2023 Cycle 12 gemcitabine /Abraxane  01/18/2023 Cycle 13 gemcitabine /Abraxane  02/01/2023 Cycle 14 gemcitabine /Abraxane  02/15/2023 CT abdomen/pelvis 02/26/2023-stable appearance of pancreas body mass with vascular encasement and chronic occlusion of the splenic vein, no evidence of metastatic disease Cycle 15 gemcitabine /Abraxane  03/01/2023 Cycle 16 gemcitabine /Abraxane  03/15/2023 Cycle 17 Gemcitabine /Abraxane  03/29/2023 EUS pancreas fiducial marker placement 04/17/2023 Pancreas SBRT 05/07/2023 - 05/17/2023, 33 Gray in 5 fractions CT abdomen/pelvis 09/14/2023: Slight decrease in pancreas body mass with persistent vascular encasement, no evidence of metastatic disease CTs [facility] 01/02/2024-ill-defined hypoattenuating mass in  the mid pancreatic body surrounding surgical clips.  Pancreatic duct within the distal pancreatic body and tail is dilated.  Mass encases the celiac trunk.  Common bile duct is dilated with suggestion of focal stenosis in the mid bile duct.  High-grade stenosis/occlusion of the portal vein.  Prominent vessels in the upper abdomen.  Heterogeneous appearance of the splenic parenchyma with suggestion of an infiltrating hypoattenuating mass/masses versus sequela of splenic infarcts.  Multiple pulmonary nodules in the lung bases concerning for metastatic disease. CTs chest/abdomen/pelvis 01/06/2024-no PE.  Cardiomegaly with small pericardial effusion.  Small irregular focus of density medial right lung base.  Poorly defined pancreatic body mass measuring 3 x 2.2 cm, previously 3 x 2.5 cm, possible extension of infiltrative soft tissue towards the pancreatic neck region with ill-defined density at the porta hepatis compared to prior.  Slight increased extrahepatic biliary and common duct dilatation compared to prior.  No focal splenic abnormality. Cycle 1 gemcitabine /abraxane  02/07/2024 Cycle 2 gemcitabine /Abraxane  03/18/2024 Cycle 3 gemcitabine /Abraxane  04/01/2024, premed changed from Compazine  to Zofran  Cycle 4 gemcitabine /Abraxane  04/14/2024, premedication changed to Aloxi /Decadron  Cycle 5 gemcitabine /Abraxane  04/29/2024, premedication continued Aloxi /Decadron  Cycle 6 gemcitabine /Abraxane  05/14/2024, premedication continued Aloxi /Decadron  CTs 05/25/2024-stable right middle lobe and right lower lobe pulmonary lesions. No new pulmonary lesions. Decrease in size of pancreatic head/body junction lesion and tumor extending down along the celiac axis. No findings for hepatic metastatic disease.  Cycle 7 gemcitabine /Abraxane  05/27/2024, Gemcitabine  dose reduced due to thrombocytopenia Cycle 8 gemcitabine /Abraxane  06/10/2024     2.  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  Similar size of of enhancing mass in the dorsal right midbrain effacing the cerebral aqueduct.  No new enhancing lesions identified.  She was transferred to [facility].  She underwent an endoscopic third ventriculostomy on 02/12/2024.   Brain CT 03/13/2024-interval resolution of obstructive hydrocephalus and transependymal edema.  Mild interval decrease in size of a mass within the right midbrain, compatible with glioma.  Chronic encephalomalacia changes in the right frontal lobe. 02/12/2024: Endoscopic ventriculostomy at [facility]   3.  Anorexia/weight loss secondary to #1 4.  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  Abdominal pain 6.  History of colon polyps 7.  Family history of multiple cancers Pancreas cancer maternal aunt, maternal cousin, maternal grandfather Breast cancer-maternal aunt, maternal cousin Prostate cancer-maternal uncle Lung cancer-father 8.  Hypertension 9.  Postmenopausal vaginal bleeding 10.  H. pylori 08/02/2022-amoxicillin  and Flagyl  discontinued 08/21/2022 due to GI symptoms 11.  Syncope event 03/07/2023 12.  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  Anaphylaxis following CT contrast 05/25/2024     Disposition: Kathleen Stone appears stable.  She continues to tolerate the gemcitabine /Abraxane  well.  She will complete another cycle today.  I encouraged her to resume the potassium supplement.  She will call if the right abdominal pain persists. The CA 19-9 was lower on 05/27/2024.  We will follow-up on the CA 19-9 from today.  She will return for an office visit and chemotherapy in 2 weeks. Kathleen Stone received an influenza vaccine today. Arley Hof, MD  06/10/2024  12:06 PM

## 2024-06-10 NOTE — Patient Instructions (Signed)

## 2024-06-10 NOTE — Patient Instructions (Signed)
 [facility] - A DEPT OF [facility]  Discharge Instructions: Thank you for choosing [facility] to provide your oncology and hematology care.   If you have a lab appointment with the Cancer Center, please go directly to the Cancer Center and check in at the registration area.   Wear comfortable clothing and clothing appropriate for easy access to any Portacath or PICC line.   We strive to give you quality time with your provider. You may need to reschedule your appointment if you arrive late (15 or more minutes).  Arriving late affects you and other patients whose appointments are after yours.  Also, if you miss three or more appointments without notifying the office, you may be dismissed from the clinic at the provider's discretion.      For prescription refill requests, have your pharmacy contact our office and allow 72 hours for refills to be completed.    Today you received the following chemotherapy and/or immunotherapy agents: Paclitaxel  protein-bound (Abraxane ) and Gemcitabine  (Gemzar )      To help prevent nausea and vomiting after your treatment, we encourage you to take your nausea medication as directed.  BELOW ARE SYMPTOMS THAT SHOULD BE REPORTED IMMEDIATELY: *FEVER GREATER THAN 100.4 F (38 C) OR HIGHER *CHILLS OR SWEATING *NAUSEA AND VOMITING THAT IS NOT CONTROLLED WITH YOUR NAUSEA MEDICATION *UNUSUAL SHORTNESS OF BREATH *UNUSUAL BRUISING OR BLEEDING *URINARY PROBLEMS (pain or burning when urinating, or frequent urination) *BOWEL PROBLEMS (unusual diarrhea, constipation, pain near the anus) TENDERNESS IN MOUTH AND THROAT WITH OR WITHOUT PRESENCE OF ULCERS (sore throat, sores in mouth, or a toothache) UNUSUAL RASH, SWELLING OR PAIN  UNUSUAL VAGINAL DISCHARGE OR ITCHING   Items with * indicate a potential emergency and should be followed up as soon as possible or go to the Emergency Department if any problems should occur.  Please  show the CHEMOTHERAPY ALERT CARD or IMMUNOTHERAPY ALERT CARD at check-in to the Emergency Department and triage nurse.  Should you have questions after your visit or need to cancel or reschedule your appointment, please contact [facility] - A DEPT OF [facility]  Dept: [phone]  and follow the prompts.  Office hours are 8:00 a.m. to 4:30 p.m. Monday - Friday. Please note that voicemails left after 4:00 p.m. may not be returned until the following business day.  We are closed weekends and major holidays. You have access to a nurse at all times for urgent questions. Please call the main number to the clinic Dept: [phone] and follow the prompts.   For any non-urgent questions, you may also contact your provider using MyChart. We now offer e-Visits for anyone 31 and older to request care online for non-urgent symptoms. For details visit [email or web address].   Also download the MyChart app! Go to the app store, search MyChart, open the app, select [place], and log in with your MyChart username and password.

## 2024-06-10 NOTE — Progress Notes (Signed)
 Patient seen by Dr. Arley Hof today  Vitals are within treatment parameters:Yes   Labs are within treatment parameters: Yes   OK to proceed w/K+ 3.3  Treatment plan has been signed: Yes   Per physician team, Patient is ready for treatment and there are NO modifications to the treatment plan.  She would like a flu vaccine today. Orders are in

## 2024-06-11 LAB — CANCER ANTIGEN 19-9: CA 19-9: 417 U/mL — ABNORMAL HIGH (ref 0–35)

## 2024-06-16 ENCOUNTER — Ambulatory Visit (HOSPITAL_COMMUNITY): Admitting: Clinical

## 2024-06-16 DIAGNOSIS — F29 Unspecified psychosis not due to a substance or known physiological condition: Secondary | ICD-10-CM

## 2024-06-24 ENCOUNTER — Encounter: Payer: Self-pay | Admitting: Nurse Practitioner

## 2024-06-24 ENCOUNTER — Inpatient Hospital Stay: Admitting: Nurse Practitioner

## 2024-06-24 ENCOUNTER — Inpatient Hospital Stay

## 2024-06-24 ENCOUNTER — Inpatient Hospital Stay: Attending: Oncology

## 2024-06-24 VITALS — BP 143/71 | HR 60 | Temp 98.0°F | Resp 18

## 2024-06-24 VITALS — BP 143/69 | HR 87 | Temp 97.9°F | Resp 18 | Ht 66.0 in | Wt 180.1 lb

## 2024-06-24 DIAGNOSIS — C251 Malignant neoplasm of body of pancreas: Secondary | ICD-10-CM

## 2024-06-24 DIAGNOSIS — Z8042 Family history of malignant neoplasm of prostate: Secondary | ICD-10-CM | POA: Diagnosis not present

## 2024-06-24 DIAGNOSIS — R978 Other abnormal tumor markers: Secondary | ICD-10-CM | POA: Diagnosis not present

## 2024-06-24 DIAGNOSIS — Z803 Family history of malignant neoplasm of breast: Secondary | ICD-10-CM | POA: Insufficient documentation

## 2024-06-24 DIAGNOSIS — Z800 Family history of malignant neoplasm of digestive organs: Secondary | ICD-10-CM | POA: Insufficient documentation

## 2024-06-24 DIAGNOSIS — E876 Hypokalemia: Secondary | ICD-10-CM | POA: Insufficient documentation

## 2024-06-24 DIAGNOSIS — Z5111 Encounter for antineoplastic chemotherapy: Secondary | ICD-10-CM | POA: Insufficient documentation

## 2024-06-24 DIAGNOSIS — R5381 Other malaise: Secondary | ICD-10-CM | POA: Insufficient documentation

## 2024-06-24 DIAGNOSIS — Z801 Family history of malignant neoplasm of trachea, bronchus and lung: Secondary | ICD-10-CM | POA: Insufficient documentation

## 2024-06-24 LAB — CBC WITH DIFFERENTIAL (CANCER CENTER ONLY)
Abs Immature Granulocytes: 0.04 K/uL (ref 0.00–0.07)
Basophils Absolute: 0 K/uL (ref 0.0–0.1)
Basophils Relative: 0 %
Eosinophils Absolute: 0 K/uL (ref 0.0–0.5)
Eosinophils Relative: 1 %
HCT: 33.5 % — ABNORMAL LOW (ref 36.0–46.0)
Hemoglobin: 10.9 g/dL — ABNORMAL LOW (ref 12.0–15.0)
Immature Granulocytes: 1 %
Lymphocytes Relative: 22 %
Lymphs Abs: 1.7 K/uL (ref 0.7–4.0)
MCH: 31 pg (ref 26.0–34.0)
MCHC: 32.5 g/dL (ref 30.0–36.0)
MCV: 95.2 fL (ref 80.0–100.0)
Monocytes Absolute: 0.7 K/uL (ref 0.1–1.0)
Monocytes Relative: 8 %
Neutro Abs: 5.5 K/uL (ref 1.7–7.7)
Neutrophils Relative %: 68 %
Platelet Count: 159 K/uL (ref 150–400)
RBC: 3.52 MIL/uL — ABNORMAL LOW (ref 3.87–5.11)
RDW: 19.9 % — ABNORMAL HIGH (ref 11.5–15.5)
WBC Count: 8 K/uL (ref 4.0–10.5)
nRBC: 0 % (ref 0.0–0.2)

## 2024-06-24 LAB — CMP (CANCER CENTER ONLY)
ALT: 13 U/L (ref 0–44)
AST: 21 U/L (ref 15–41)
Albumin: 3.6 g/dL (ref 3.5–5.0)
Alkaline Phosphatase: 95 U/L (ref 38–126)
Anion gap: 14 (ref 5–15)
BUN: 9 mg/dL (ref 6–20)
CO2: 22 mmol/L (ref 22–32)
Calcium: 9.4 mg/dL (ref 8.9–10.3)
Chloride: 107 mmol/L (ref 98–111)
Creatinine: 0.78 mg/dL (ref 0.44–1.00)
GFR, Estimated: >60 mL/min (ref >60)
Glucose, Bld: 184 mg/dL — ABNORMAL HIGH (ref 70–99)
Potassium: 3.3 mmol/L — ABNORMAL LOW (ref 3.5–5.1)
Sodium: 143 mmol/L (ref 135–145)
Total Bilirubin: 0.5 mg/dL (ref 0.0–1.2)
Total Protein: 6.8 g/dL (ref 6.5–8.1)

## 2024-06-24 MED ORDER — DEXAMETHASONE 4 MG PO TABS: 4.0000 mg | ORAL_TABLET | Freq: Two times a day (BID) | ORAL | 0 refills | Status: DC

## 2024-06-24 MED ORDER — DEXAMETHASONE SODIUM PHOSPHATE 10 MG/ML IJ SOLN
10.0000 mg | Freq: Once | INTRAMUSCULAR | Status: AC
  Administered 2024-06-24: 10 mg via INTRAVENOUS
  Filled 2024-06-24: qty 1

## 2024-06-24 MED ORDER — PACLITAXEL PROTEIN-BOUND CHEMO INJECTION 100 MG
100.0000 mg/m2 | Freq: Once | INTRAVENOUS | Status: AC
  Administered 2024-06-24: 200 mg via INTRAVENOUS
  Filled 2024-06-24: qty 40

## 2024-06-24 MED ORDER — SODIUM CHLORIDE 0.9 % IV SOLN
800.0000 mg/m2 | Freq: Once | INTRAVENOUS | Status: AC
  Administered 2024-06-24: 1520 mg via INTRAVENOUS
  Filled 2024-06-24: qty 25.98

## 2024-06-24 MED ORDER — SODIUM CHLORIDE 0.9 % IV SOLN: Freq: Once | INTRAVENOUS | Status: AC

## 2024-06-24 MED ORDER — PALONOSETRON HCL INJECTION 0.25 MG/5ML
0.2500 mg | Freq: Once | INTRAVENOUS | Status: AC
  Administered 2024-06-24: 0.25 mg via INTRAVENOUS
  Filled 2024-06-24: qty 5

## 2024-06-24 NOTE — Progress Notes (Signed)
 Patient seen by Olam Ned NP today  Vitals are within treatment parameters:Yes   Labs are within treatment parameters: Yes   Treatment plan has been signed: Yes   Per physician team, Patient is ready for treatment and there are NO modifications to the treatment plan.

## 2024-06-24 NOTE — Patient Instructions (Signed)
 [facility] - A DEPT OF [facility]  Discharge Instructions: Thank you for choosing [facility] to provide your oncology and hematology care.   If you have a lab appointment with the Cancer Center, please go directly to the Cancer Center and check in at the registration area.   Wear comfortable clothing and clothing appropriate for easy access to any Portacath or PICC line.   We strive to give you quality time with your provider. You may need to reschedule your appointment if you arrive late (15 or more minutes).  Arriving late affects you and other patients whose appointments are after yours.  Also, if you miss three or more appointments without notifying the office, you may be dismissed from the clinic at the provider's discretion.      For prescription refill requests, have your pharmacy contact our office and allow 72 hours for refills to be completed.    Today you received the following chemotherapy and/or immunotherapy agents: Paclitaxel  protein-bound (Abraxane ) and Gemcitabine  (Gemzar )      To help prevent nausea and vomiting after your treatment, we encourage you to take your nausea medication as directed.  BELOW ARE SYMPTOMS THAT SHOULD BE REPORTED IMMEDIATELY: *FEVER GREATER THAN 100.4 F (38 C) OR HIGHER *CHILLS OR SWEATING *NAUSEA AND VOMITING THAT IS NOT CONTROLLED WITH YOUR NAUSEA MEDICATION *UNUSUAL SHORTNESS OF BREATH *UNUSUAL BRUISING OR BLEEDING *URINARY PROBLEMS (pain or burning when urinating, or frequent urination) *BOWEL PROBLEMS (unusual diarrhea, constipation, pain near the anus) TENDERNESS IN MOUTH AND THROAT WITH OR WITHOUT PRESENCE OF ULCERS (sore throat, sores in mouth, or a toothache) UNUSUAL RASH, SWELLING OR PAIN  UNUSUAL VAGINAL DISCHARGE OR ITCHING   Items with * indicate a potential emergency and should be followed up as soon as possible or go to the Emergency Department if any problems should occur.  Please  show the CHEMOTHERAPY ALERT CARD or IMMUNOTHERAPY ALERT CARD at check-in to the Emergency Department and triage nurse.  Should you have questions after your visit or need to cancel or reschedule your appointment, please contact [facility] - A DEPT OF [facility]  Dept: [phone]  and follow the prompts.  Office hours are 8:00 a.m. to 4:30 p.m. Monday - Friday. Please note that voicemails left after 4:00 p.m. may not be returned until the following business day.  We are closed weekends and major holidays. You have access to a nurse at all times for urgent questions. Please call the main number to the clinic Dept: [phone] and follow the prompts.   For any non-urgent questions, you may also contact your provider using MyChart. We now offer e-Visits for anyone 31 and older to request care online for non-urgent symptoms. For details visit [email or web address].   Also download the MyChart app! Go to the app store, search MyChart, open the app, select [place], and log in with your MyChart username and password.

## 2024-06-24 NOTE — Progress Notes (Signed)
 [facility] OFFICE PROGRESS NOTE   Diagnosis: Pancreas cancer  INTERVAL HISTORY:   [name] returns as scheduled.  She completed another cycle of gemcitabine /Abraxane  06/10/2024.  She denies nausea/vomiting.  No mouth sores.  No change in baseline bowel habits, up to 3 loose stools a day.  No fever or rash after treatment.  Intermittent numbness/tingling in the hands and feet.  Symptoms do not interfere with activity.  No bleeding.  Appetite varies.  No abdominal pain.  Objective:  Vital signs in last 24 hours:  Blood pressure (!) 143/69, pulse 87, temperature 97.9 F (36.6 C), temperature source Temporal, resp. rate 18, height 5' 6 (1.676 m), weight 180 lb 1.6 oz (81.7 kg), last menstrual period 02/07/2016, SpO2 100%.    HEENT: No thrush or ulcers. Resp: Lungs clear bilaterally. Cardio: Regular rate and rhythm. GI: No hepatosplenomegaly.  Nontender. Vascular: No leg edema. Skin: No rash. Port-A-Cath without erythema.  Lab Results:  Lab Results  Component Value Date   WBC 8.0 06/24/2024   HGB 10.9 (L) 06/24/2024   HCT 33.5 (L) 06/24/2024   MCV 95.2 06/24/2024   PLT 159 06/24/2024   NEUTROABS 5.5 06/24/2024    Imaging:  No results found.  Medications: I have reviewed the patient's current medications.  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 CT abdomen/pelvis 07/30/2022-body/neck pancreas mass with peripheral pancreatic atrophy and duct dilation, suspected encasement of the celiac axis, uterine fibroids MRI abdomen 07/30/2022-3.7 x 4.7 cm pancreas body/tail mass with encasement of the proximal celiac artery, occlusion with collateralization at the confluence of the SMV and portal vein, no evidence of metastatic disease CT chest 08/01/2022-no intrathoracic metastases Elevated CA 19-9 EUS 08/02/2022-gastric polyp, gastric erosions, 32 x 33 mm pancreas body mass, invasion of the celiac trunk, invasion of the splenoportal confluence, no SMA invasion, 2  malignant appearing nodes in the porta hepatis,T4N1 by endoscopic criteria, FNA biopsy-adenocarcinoma 08/17/2022 Guardant360-tumor mutation burden 0.96; MSI high not detected; 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  Stable porta hepatis lymph nodes. Cycle 7 gemcitabine /Abraxane  11/09/2022 Cycle 8 gemcitabine /Abraxane  11/23/2022 Cycle 9 gemcitabine /Abraxane  12/07/2022 Cycle 10 gemcitabine /Abraxane  12/21/2022 Cycle 11 gemcitabine /Abraxane  01/04/2023 Cycle 12 gemcitabine /Abraxane  01/18/2023 Cycle 13 gemcitabine /Abraxane  02/01/2023 Cycle 14 gemcitabine /Abraxane  02/15/2023 CT abdomen/pelvis 02/26/2023-stable appearance of pancreas body mass with vascular encasement and chronic occlusion of the splenic vein, no evidence of metastatic disease Cycle 15 gemcitabine /Abraxane  03/01/2023 Cycle 16 gemcitabine /Abraxane  03/15/2023 Cycle 17 Gemcitabine /Abraxane  03/29/2023 EUS pancreas fiducial marker placement 04/17/2023 Pancreas SBRT 05/07/2023 - 05/17/2023, 33 Gray in 5 fractions CT abdomen/pelvis 09/14/2023: Slight decrease in pancreas body mass with persistent vascular encasement, no evidence of metastatic disease CTs [facility] 01/02/2024-ill-defined hypoattenuating mass in the mid pancreatic body surrounding surgical clips.  Pancreatic duct within the distal pancreatic body and tail is dilated.  Mass encases the celiac trunk.  Common bile duct is dilated with suggestion of focal stenosis in the mid bile duct.  High-grade stenosis/occlusion of the portal vein.  Prominent vessels in the upper abdomen.   Heterogeneous appearance of the splenic parenchyma with suggestion of an infiltrating hypoattenuating mass/masses versus sequela of splenic infarcts.  Multiple pulmonary nodules in the lung bases concerning for metastatic disease. CTs chest/abdomen/pelvis 01/06/2024-no PE.  Cardiomegaly with small pericardial  effusion.  Small irregular focus of density medial right lung base.  Poorly defined pancreatic body mass measuring 3 x 2.2 cm, previously 3 x 2.5 cm, possible extension of infiltrative soft tissue towards the pancreatic neck region with ill-defined density at the porta hepatis compared to prior.  Slight increased extrahepatic biliary and common duct dilatation compared to prior.  No focal splenic abnormality. Cycle 1 gemcitabine /abraxane  02/07/2024 Cycle 2 gemcitabine /Abraxane  03/18/2024 Cycle 3 gemcitabine /Abraxane  04/01/2024, premed changed from Compazine  to Zofran  Cycle 4 gemcitabine /Abraxane  04/14/2024, premedication changed to Aloxi /Decadron  Cycle 5 gemcitabine /Abraxane  04/29/2024, premedication continued Aloxi /Decadron  Cycle 6 gemcitabine /Abraxane  05/14/2024, premedication continued Aloxi /Decadron  CTs 05/25/2024-stable right middle lobe and right lower lobe pulmonary lesions. No new pulmonary lesions. Decrease in size of pancreatic head/body junction lesion and tumor extending down along the celiac axis. No findings for hepatic metastatic disease.  Cycle 7 gemcitabine /Abraxane  05/27/2024, Gemcitabine  dose reduced due to thrombocytopenia Cycle 8 gemcitabine /Abraxane  06/10/2024 Cycle 9 gemcitabine /Abraxane  06/24/2024     2.  Pilocystic astrocytoma of the dorsal midbrain-biopsy 03/17/2020, followed at [facility] with observation Brain MRI 02/09/2024-interval development of hydrocephalus with transependymal flow of CSF.  Similar size of of enhancing mass in the dorsal right midbrain effacing the cerebral aqueduct.  No new enhancing lesions identified.  She was transferred to [facility].  She underwent an endoscopic third  ventriculostomy on 02/12/2024.   Brain CT 03/13/2024-interval resolution of obstructive hydrocephalus and transependymal edema.  Mild interval decrease in size of a mass within the right midbrain, compatible with glioma.  Chronic encephalomalacia changes in the right frontal lobe. 02/12/2024: Endoscopic ventriculostomy at [facility]   3.  Anorexia/weight loss secondary to #1 4.  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  Abdominal pain 6.  History of colon polyps 7.  Family history of multiple cancers Pancreas cancer maternal aunt, maternal cousin, maternal grandfather Breast cancer-maternal aunt, maternal cousin Prostate cancer-maternal uncle Lung cancer-father 8.  Hypertension 9.  Postmenopausal vaginal bleeding 10.  H. pylori 08/02/2022-amoxicillin  and Flagyl  discontinued 08/21/2022 due to GI symptoms 11.  Syncope event 03/07/2023 12.  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  Anaphylaxis following CT contrast 05/25/2024  Disposition: [name] appears stable.  She continues Gemcitabine /Abraxane  on a 2-week schedule.  She is tolerating treatment well.  No clinical evidence of disease progression.  Most recent CA 19-9 tumor marker was further improved.  Plan to proceed with gemcitabine /Abraxane  today as scheduled.  CBC and chemistry panel reviewed.  Labs adequate for treatment.  She has persistent mild hypokalemia.  She will try to take potassium 20 meq daily as prescribed.   She will return for follow-up and treatment in 2 weeks.  We are available to see her sooner if needed.    Olam Ned ANP/GNP-BC   06/24/2024  10:52 AM

## 2024-06-25 ENCOUNTER — Other Ambulatory Visit: Payer: Self-pay

## 2024-06-25 LAB — CANCER ANTIGEN 19-9: CA 19-9: 383 U/mL — ABNORMAL HIGH (ref 0–35)

## 2024-06-29 ENCOUNTER — Other Ambulatory Visit: Payer: Self-pay | Admitting: *Deleted

## 2024-06-29 NOTE — Patient Instructions (Signed)
 Visit Information  Thank you for taking time to visit with me today. Please don't hesitate to contact me if I can be of assistance to you before our next scheduled appointment.  Your next care management appointment is by telephone on 07/26/2024 at 0930  Please call the care guide team at [phone] if you need to cancel, schedule, or reschedule an appointment.   Please call the Suicide and Crisis Lifeline: 988 call the [place]  National Suicide Prevention Lifeline: 513-533-9093 or TTY: 631-437-2876 TTY 3081900778) to talk to a trained counselor call 1-800-273-TALK (toll free, 24 hour hotline) if you are experiencing a Mental Health or Behavioral Health Crisis or need someone to talk to.   Olam Ku, RN, BSN [place]  [facility], [facility] RN Care Manager Direct Dial: [phone]  Fax: [phone]

## 2024-06-29 NOTE — Progress Notes (Signed)
 Kathleen Stone is a 58 y.o. female patient presented to the scheduled appointment oriented times five, appropriately dressed and friendly. Client denied hallucinations and delusions. Client reported she is doing well today. Client reported the creatures are still present but she doe snot feel in distress or a threat to herself or others. Client reported she has her sister to double check and she tells her she does not feel anything. Client reported this time of year is hard because of her divorce but she is okay. Client reported no crisis needs  **therapist and client did not talk for a billable amount of time. Client will be scheduled for next appointment.**     Collaboration of Care: Patient refused AEB none requested.  Patient/Guardian was advised Release of Information must be obtained prior to any record release in order to collaborate their care with an outside provider. Patient/Guardian was advised if they have not already done so to contact the registration department to sign all necessary forms in order for us  to release information regarding their care.   Consent: Patient/Guardian gives verbal consent for treatment and assignment of benefits for services provided during this visit. Patient/Guardian expressed understanding and agreed to proceed.    Cybill Uriegas Y Khaleah Duer, LCSW

## 2024-06-29 NOTE — Patient Outreach (Signed)
 Complex Care Management   Visit Note  06/29/2024  Name:  Kathleen Stone MRN: [id number] DOB: January 08, 1966  Situation: Referral received for Complex Care Management related to Pancreatic Mass I obtained verbal consent from Patient.  Visit completed with Patient  on the phone  Background:   Past Medical History:  Diagnosis Date   Abnormal stress test 01/16/2017   Brain tumor (benign) (HCC)    DJD (degenerative joint disease) of cervical spine    Galactorrhea of both breasts 2009   R. sided w/ benign papilloma excised in 4/09. L sided in 5/09   Hematuria, microscopic 02/24/2020   Hyperlipidemia    Hypertension    Hypokalemia    LOW BACK PAIN SYNDROME 07/10/2007   Qualifier: Diagnosis of   By: Lanis MD, Christopher         Nausea and vomiting 08/01/2022   Pancreatic cancer [facility])    Personal history of adenomatous colonic polyp    09/2007 - diminutive adenoma [facility])   Stroke [facility])    2021- brain tumor bx caused ? stroke-    Tobacco abuse    Unintentional weight loss 05/26/2022    Assessment: Patient Reported Symptoms:  Cognitive Cognitive Status: Alert and oriented to person, place, and time, Able to follow simple commands, Normal speech and language skills   Health Maintenance Behaviors: Annual physical exam  Neurological Neurological Review of Symptoms: No symptoms reported Neurological Management Strategies: Routine screening  HEENT HEENT Symptoms Reported: No symptoms reported      Cardiovascular Cardiovascular Symptoms Reported: No symptoms reported Does patient have uncontrolled Hypertension?: Yes Is patient checking Blood Pressure at home?: Yes Patient's Recent BP reading at home: 143/71 on 06/24/2024 note in EPIC. Cardiovascular Management Strategies: Medication therapy, Routine screening Weight: 180 lb (81.6 kg)  Respiratory Respiratory Symptoms Reported: No symptoms reported    Endocrine Endocrine Symptoms Reported: No symptoms reported Is patient diabetic?: No     Gastrointestinal Gastrointestinal Symptoms Reported: No symptoms reported Gastrointestinal Management Strategies: Medication therapy    Genitourinary Genitourinary Symptoms Reported: No symptoms reported    Integumentary Integumentary Symptoms Reported: No symptoms reported Skin Management Strategies: Routine screening  Musculoskeletal Musculoskelatal Symptoms Reviewed: No symptoms reported Musculoskeletal Management Strategies: Routine screening      Psychosocial Psychosocial Symptoms Reported: No symptoms reported   Major Change/Loss/Stressor/Fears (CP): Denies       There were no vitals filed for this visit.  Medications Reviewed Today     Reviewed by Alvia Olam BIRCH, RN (Registered Nurse) on 06/29/24 at 1011  Med List Status: <None>   Medication Order Taking? Sig Documenting Provider Last Dose Status Informant  acetaminophen  (TYLENOL ) 325 MG tablet [id number]  Take by mouth every 6 (six) hours as needed.  Patient not taking: Reported on 06/24/2024   [provider]  Active   albuterol  (VENTOLIN  HFA) 108 (90 Base) MCG/ACT inhaler [id number] Yes Inhale 2 puffs into the lungs every 6 (six) hours as needed for wheezing or shortness of breath. Cloretta Arley NOVAK, MD  Active   amLODipine  (NORVASC ) 5 MG tablet [id number]  Take 1 tablet (5 mg total) by mouth daily.  Patient not taking: Reported on 06/24/2024   Heddy Barren, DO  Active   apixaban  (ELIQUIS ) 5 MG TABS tablet [id number]  Take 1 tablet (5 mg total) by mouth 2 (two) times daily.  Patient not taking: Reported on 06/24/2024   Tawkaliyar, Roya, DO  Active            Med Note (COWARD, SUSAN L  Fri Feb 21, 2024  9:00 AM) On Hold x 2 weeks  dexamethasone  (DECADRON ) 4 MG tablet [id number] Yes Take 1 tablet (4 mg total) by mouth 2 (two) times daily. Take one tablet twice daily x 3 days after chemo. Start on 06/25/2024 Debby Olam POUR, NP  Active   EPINEPHrine  0.3 mg/0.3 mL IJ SOAJ injection [id number]  Inject 0.3 mg into the  muscle as needed for anaphylaxis.  Patient not taking: Reported on 06/24/2024   Dreama Longs, MD  Active   lidocaine -prilocaine  (EMLA ) cream [id number] Yes Apply 1 Application topically as needed. Cloretta Arley NOVAK, MD  Active   lipase/protease/amylase (CREON ) 36000 UNITS CPEP capsule [id number]  Take 2 capsules (72,000 Units total) by mouth 3 (three) times daily with meals.  Patient not taking: Reported on 06/24/2024   Debby Olam POUR, NP  Active   loperamide (IMODIUM) 2 MG capsule [id number]  Take 2-4 mg by mouth as needed for diarrhea or loose stools. Maximum 8/day  Patient not taking: Reported on 06/24/2024   [provider]  Active   Melatonin 5 MG CAPS [id number] Yes Take 1 capsule (5 mg total) by mouth at bedtime. Carrion-Carrero, Marlo, MD  Active   mirtazapine  (REMERON ) 7.5 MG tablet [id number] Yes Take 1 tablet (7.5 mg total) by mouth at bedtime. Carrion-Carrero, Marlo, MD  Active   nitroGLYCERIN  (NITROSTAT ) 0.4 MG SL tablet [id number]  Place 1 tablet (0.4 mg total) under the tongue every 5 (five) minutes as needed for chest pain.  Patient not taking: Reported on 06/24/2024   Atway, Rayann N, DO  Active Self  ondansetron  (ZOFRAN ) 8 MG tablet [id number] Yes Take 1 tablet (8 mg total) by mouth every 8 (eight) hours as needed. Cloretta Arley NOVAK, MD  Active   oxyCODONE  (OXY IR/ROXICODONE ) 5 MG immediate release tablet [id number] Yes Take 5 mg by mouth every 6 (six) hours as needed for severe pain (pain score 7-10) (Reported by patient). [provider]  Active   pantoprazole  (PROTONIX ) 40 MG tablet [id number]  Take 1 tablet (40 mg total) by mouth daily.  Patient not taking: Reported on 06/24/2024   Cloretta Arley NOVAK, MD  Active   potassium chloride  SA (KLOR-CON  M) 20 MEQ tablet [id number] Yes Take 1 tablet (20 mEq total) by mouth daily. Debby Olam POUR, NP  Active   prochlorperazine  (COMPAZINE ) 10 MG tablet [id number]  Take 1 tablet (10 mg total) by mouth every 6 (six) hours as  needed.  Patient not taking: Reported on 06/24/2024   Cloretta Arley NOVAK, MD  Active             Recommendation:   PCP Follow-up Continue Current Plan of Care  Follow Up Plan:   Telephone follow up appointment date/time:  07/26/2024 @ 9:30 am   Olam Ku, RN, BSN [facility], [facility] RN Care Manager Direct Dial: [phone]  Fax: [phone]

## 2024-07-01 ENCOUNTER — Other Ambulatory Visit: Payer: Self-pay | Admitting: Oncology

## 2024-07-08 ENCOUNTER — Inpatient Hospital Stay

## 2024-07-08 ENCOUNTER — Inpatient Hospital Stay (HOSPITAL_BASED_OUTPATIENT_CLINIC_OR_DEPARTMENT_OTHER): Admitting: Oncology

## 2024-07-08 VITALS — BP 130/72 | HR 76 | Temp 97.8°F | Resp 18 | Ht 66.0 in | Wt 176.4 lb

## 2024-07-08 VITALS — BP 169/69 | HR 63 | Resp 18

## 2024-07-08 DIAGNOSIS — C251 Malignant neoplasm of body of pancreas: Secondary | ICD-10-CM

## 2024-07-08 DIAGNOSIS — Z5111 Encounter for antineoplastic chemotherapy: Secondary | ICD-10-CM | POA: Diagnosis not present

## 2024-07-08 LAB — CBC WITH DIFFERENTIAL (CANCER CENTER ONLY)
Abs Immature Granulocytes: 0.02 K/uL (ref 0.00–0.07)
Basophils Absolute: 0 K/uL (ref 0.0–0.1)
Basophils Relative: 0 %
Eosinophils Absolute: 0.1 K/uL (ref 0.0–0.5)
Eosinophils Relative: 1 %
HCT: 32.8 % — ABNORMAL LOW (ref 36.0–46.0)
Hemoglobin: 10.7 g/dL — ABNORMAL LOW (ref 12.0–15.0)
Immature Granulocytes: 0 %
Lymphocytes Relative: 20 %
Lymphs Abs: 1.4 K/uL (ref 0.7–4.0)
MCH: 31 pg (ref 26.0–34.0)
MCHC: 32.6 g/dL (ref 30.0–36.0)
MCV: 95.1 fL (ref 80.0–100.0)
Monocytes Absolute: 0.6 K/uL (ref 0.1–1.0)
Monocytes Relative: 9 %
Neutro Abs: 4.8 K/uL (ref 1.7–7.7)
Neutrophils Relative %: 70 %
Platelet Count: 137 K/uL — ABNORMAL LOW (ref 150–400)
RBC: 3.45 MIL/uL — ABNORMAL LOW (ref 3.87–5.11)
RDW: 17.4 % — ABNORMAL HIGH (ref 11.5–15.5)
WBC Count: 6.8 K/uL (ref 4.0–10.5)
nRBC: 0 % (ref 0.0–0.2)

## 2024-07-08 LAB — CMP (CANCER CENTER ONLY)
ALT: 12 U/L (ref 0–44)
AST: 19 U/L (ref 15–41)
Albumin: 3.5 g/dL (ref 3.5–5.0)
Alkaline Phosphatase: 90 U/L (ref 38–126)
Anion gap: 9 (ref 5–15)
BUN: 9 mg/dL (ref 6–20)
CO2: 28 mmol/L (ref 22–32)
Calcium: 9.4 mg/dL (ref 8.9–10.3)
Chloride: 108 mmol/L (ref 98–111)
Creatinine: 0.67 mg/dL (ref 0.44–1.00)
GFR, Estimated: >60 mL/min (ref >60)
Glucose, Bld: 119 mg/dL — ABNORMAL HIGH (ref 70–99)
Potassium: 3 mmol/L — ABNORMAL LOW (ref 3.5–5.1)
Sodium: 144 mmol/L (ref 135–145)
Total Bilirubin: 0.5 mg/dL (ref 0.0–1.2)
Total Protein: 6.4 g/dL — ABNORMAL LOW (ref 6.5–8.1)

## 2024-07-08 MED ORDER — LIDOCAINE-PRILOCAINE 2.5-2.5 % EX CREA: 1.0000 | TOPICAL_CREAM | CUTANEOUS | 1 refills | Status: DC | PRN

## 2024-07-08 MED ORDER — PALONOSETRON HCL INJECTION 0.25 MG/5ML
0.2500 mg | Freq: Once | INTRAVENOUS | Status: AC
  Administered 2024-07-08: 0.25 mg via INTRAVENOUS
  Filled 2024-07-08: qty 5

## 2024-07-08 MED ORDER — PACLITAXEL PROTEIN-BOUND CHEMO INJECTION 100 MG
100.0000 mg/m2 | Freq: Once | INTRAVENOUS | Status: AC
  Administered 2024-07-08: 200 mg via INTRAVENOUS
  Filled 2024-07-08: qty 40

## 2024-07-08 MED ORDER — DEXAMETHASONE SOD PHOSPHATE PF 10 MG/ML IJ SOLN
10.0000 mg | Freq: Once | INTRAMUSCULAR | Status: AC
  Administered 2024-07-08: 10 mg via INTRAVENOUS

## 2024-07-08 MED ORDER — SODIUM CHLORIDE 0.9 % IV SOLN: Freq: Once | INTRAVENOUS | Status: AC

## 2024-07-08 MED ORDER — SODIUM CHLORIDE 0.9 % IV SOLN
800.0000 mg/m2 | Freq: Once | INTRAVENOUS | Status: AC
  Administered 2024-07-08: 1520 mg via INTRAVENOUS
  Filled 2024-07-08: qty 39.98

## 2024-07-08 NOTE — Progress Notes (Signed)
 [facility] OFFICE PROGRESS NOTE   Diagnosis: Pancreas cancer  INTERVAL HISTORY:   Kathleen Stone completed another cycle of gemcitabine /Abraxane  06/24/2024.  She reports malaise following chemotherapy.  She has intermittent tingling in the feet.  This does not interfere with ambulation.  Good appetite.  No abdominal pain.  No fever or rash.  Objective:  Vital signs in last 24 hours:  Blood pressure 130/72, pulse 76, temperature 97.8 F (36.6 C), temperature source Temporal, resp. rate 18, height 5' 6 (1.676 m), weight 176 lb 6.4 oz (80 kg), last menstrual period 02/07/2016, SpO2 100%.    HEENT: No thrush Resp: Lungs clear bilaterally Cardio: Regular rate and rhythm GI: Nontender, no mass, no apparent ascites, no hepatosplenomegaly Vascular: No leg edema   Portacath/PICC-without erythema  Lab Results:  Lab Results  Component Value Date   WBC PENDING 07/08/2024   HGB 10.7 (L) 07/08/2024   HCT 32.8 (L) 07/08/2024   MCV 95.1 07/08/2024   PLT 137 (L) 07/08/2024   NEUTROABS PENDING 07/08/2024    CMP  Lab Results  Component Value Date   NA 143 06/24/2024   K 3.3 (L) 06/24/2024   CL 107 06/24/2024   CO2 22 06/24/2024   GLUCOSE 184 (H) 06/24/2024   BUN 9 06/24/2024   CREATININE 0.78 06/24/2024   CALCIUM  9.4 06/24/2024   PROT 6.8 06/24/2024   ALBUMIN 3.6 06/24/2024   AST 21 06/24/2024   ALT 13 06/24/2024   ALKPHOS 95 06/24/2024   BILITOT 0.5 06/24/2024   GFRNONAA >60 06/24/2024   GFRAA >60 02/29/2020    Lab Results  Component Value Date   CEA1 27.4 (H) 07/31/2022   RJW800 383 (H) 06/24/2024    Lab Results  Component Value Date   INR 1.0 07/31/2022   LABPROT 13.1 07/31/2022    Imaging:  No results found.  Medications: I have reviewed the patient's current medications.   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 CT abdomen/pelvis 07/30/2022-body/neck pancreas mass with peripheral pancreatic atrophy and duct dilation, suspected encasement  of the celiac axis, uterine fibroids MRI abdomen 07/30/2022-3.7 x 4.7 cm pancreas body/tail mass with encasement of the proximal celiac artery, occlusion with collateralization at the confluence of the SMV and portal vein, no evidence of metastatic disease CT chest 08/01/2022-no intrathoracic metastases Elevated CA 19-9 EUS 08/02/2022-gastric polyp, gastric erosions, 32 x 33 mm pancreas body mass, invasion of the celiac trunk, invasion of the splenoportal confluence, no SMA invasion, 2 malignant appearing nodes in the porta hepatis,T4N1 by endoscopic criteria, FNA biopsy-adenocarcinoma 08/17/2022 [employer]-tumor mutation burden 0.96; MSI high not detected; 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  Stable porta hepatis lymph nodes. Cycle 7 gemcitabine /Abraxane  11/09/2022 Cycle 8 gemcitabine /Abraxane  11/23/2022 Cycle 9 gemcitabine /Abraxane  12/07/2022 Cycle 10 gemcitabine /Abraxane  12/21/2022 Cycle 11 gemcitabine /Abraxane  01/04/2023 Cycle 12 gemcitabine /Abraxane  01/18/2023 Cycle 13 gemcitabine /Abraxane  02/01/2023 Cycle 14 gemcitabine /Abraxane  02/15/2023 CT abdomen/pelvis 02/26/2023-stable appearance of pancreas body mass with vascular encasement and chronic occlusion of the splenic vein, no evidence of metastatic disease Cycle 15 gemcitabine /Abraxane  03/01/2023 Cycle 16 gemcitabine /Abraxane  03/15/2023 Cycle 17 Gemcitabine /Abraxane  03/29/2023 EUS pancreas fiducial marker placement 04/17/2023 Pancreas SBRT 05/07/2023 - 05/17/2023, 33 Gray in 5 fractions CT abdomen/pelvis 09/14/2023: Slight decrease in pancreas body  mass with persistent vascular encasement, no  evidence of metastatic disease CTs [facility] 01/02/2024-ill-defined hypoattenuating mass in the mid pancreatic body surrounding surgical clips.  Pancreatic duct within the distal pancreatic body and tail is dilated.  Mass encases the celiac trunk.  Common bile duct is dilated with suggestion of focal stenosis in the mid bile duct.  High-grade stenosis/occlusion of the portal vein.  Prominent vessels in the upper abdomen.  Heterogeneous appearance of the splenic parenchyma with suggestion of an infiltrating hypoattenuating mass/masses versus sequela of splenic infarcts.  Multiple pulmonary nodules in the lung bases concerning for metastatic disease. CTs chest/abdomen/pelvis 01/06/2024-no PE.  Cardiomegaly with small pericardial effusion.  Small irregular focus of density medial right lung base.  Poorly defined pancreatic body mass measuring 3 x 2.2 cm, previously 3 x 2.5 cm, possible extension of infiltrative soft tissue towards the pancreatic neck region with ill-defined density at the porta hepatis compared to prior.  Slight increased extrahepatic biliary and common duct dilatation compared to prior.  No focal splenic abnormality. Cycle 1 gemcitabine /abraxane  02/07/2024 Cycle 2 gemcitabine /Abraxane  03/18/2024 Cycle 3 gemcitabine /Abraxane  04/01/2024, premed changed from Compazine  to Zofran  Cycle 4 gemcitabine /Abraxane  04/14/2024, premedication changed to Aloxi /Decadron  Cycle 5 gemcitabine /Abraxane  04/29/2024, premedication continued Aloxi /Decadron  Cycle 6 gemcitabine /Abraxane  05/14/2024, premedication continued Aloxi /Decadron  CTs 05/25/2024-stable right middle lobe and right lower lobe pulmonary lesions. No new pulmonary lesions. Decrease in size of pancreatic head/body junction lesion and tumor extending down along the celiac axis. No findings for hepatic metastatic disease.  Cycle 7 gemcitabine /Abraxane  05/27/2024, Gemcitabine  dose reduced due to  thrombocytopenia Cycle 8 gemcitabine /Abraxane  06/10/2024 Cycle 9 gemcitabine /Abraxane  06/24/2024 Cycle 10 gemcitabine /Abraxane  07/08/2024     2.  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  Similar size of of enhancing mass in the dorsal right midbrain effacing the cerebral aqueduct.  No new enhancing lesions identified.  She was [facility] to [facility]  She underwent an endoscopic third ventriculostomy on 02/12/2024.   Brain CT 03/13/2024-interval resolution of obstructive hydrocephalus and transependymal edema.  Mild interval decrease in size of a mass within the right midbrain, compatible with glioma.  Chronic encephalomalacia changes in the right frontal lobe. 02/12/2024: Endoscopic ventriculostomy at [facility]   3.  Anorexia/weight loss secondary to #1 4.  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  Abdominal pain 6.  History of colon polyps 7.  Family history of multiple cancers Pancreas cancer maternal aunt, maternal cousin, maternal grandfather Breast cancer-maternal aunt, maternal cousin Prostate cancer-maternal uncle Lung cancer-father 8.  Hypertension 9.  Postmenopausal vaginal bleeding 10.  H. pylori 08/02/2022-amoxicillin  and Flagyl  discontinued 08/21/2022 due to GI symptoms 11.  Syncope event 03/07/2023 12.  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  Anaphylaxis following CT contrast 05/25/2024    Disposition: Kathleen Stone appears stable.  She continues to tolerate the gemcitabine /Abraxane  well, though she may be developing early peripheral neuropathy.  The CA 19-9 was lower on 06/24/2024.  She will complete another cycle of gemcitabine /Abraxane  today.  Kathleen Stone will return for an office visit and chemotherapy in 2 weeks.  We will plan for a restaging CT after cycle 12.  Arley Hof,  MD  07/08/2024  10:53 AM

## 2024-07-08 NOTE — Progress Notes (Signed)
 Patient seen by Dr. Arley Hof today  Vitals are within treatment parameters:Yes   Labs are within treatment parameters: Yes K+ 3.0, but confirmed she is not taking it consistently. She will try   Treatment plan has been signed: Yes   Per physician team, Patient is ready for treatment and there are NO modifications to the treatment plan.

## 2024-07-08 NOTE — Patient Instructions (Signed)
 [facility] - A DEPT OF [facility]  Discharge Instructions: Thank you for choosing [facility] to provide your oncology and hematology care.   If you have a lab appointment with the Cancer Center, please go directly to the Cancer Center and check in at the registration area.   Wear comfortable clothing and clothing appropriate for easy access to any Portacath or PICC line.   We strive to give you quality time with your provider. You may need to reschedule your appointment if you arrive late (15 or more minutes).  Arriving late affects you and other patients whose appointments are after yours.  Also, if you miss three or more appointments without notifying the office, you may be dismissed from the clinic at the provider's discretion.      For prescription refill requests, have your pharmacy contact our office and allow 72 hours for refills to be completed.    Today you received the following chemotherapy and/or immunotherapy agents: Paclitaxel  protein-bound (Abraxane ) and Gemcitabine  (Gemzar )      To help prevent nausea and vomiting after your treatment, we encourage you to take your nausea medication as directed.  BELOW ARE SYMPTOMS THAT SHOULD BE REPORTED IMMEDIATELY: *FEVER GREATER THAN 100.4 F (38 C) OR HIGHER *CHILLS OR SWEATING *NAUSEA AND VOMITING THAT IS NOT CONTROLLED WITH YOUR NAUSEA MEDICATION *UNUSUAL SHORTNESS OF BREATH *UNUSUAL BRUISING OR BLEEDING *URINARY PROBLEMS (pain or burning when urinating, or frequent urination) *BOWEL PROBLEMS (unusual diarrhea, constipation, pain near the anus) TENDERNESS IN MOUTH AND THROAT WITH OR WITHOUT PRESENCE OF ULCERS (sore throat, sores in mouth, or a toothache) UNUSUAL RASH, SWELLING OR PAIN  UNUSUAL VAGINAL DISCHARGE OR ITCHING   Items with * indicate a potential emergency and should be followed up as soon as possible or go to the Emergency Department if any problems should occur.  Please  show the CHEMOTHERAPY ALERT CARD or IMMUNOTHERAPY ALERT CARD at check-in to the Emergency Department and triage nurse.  Should you have questions after your visit or need to cancel or reschedule your appointment, please contact [facility] - A DEPT OF [facility]  Dept: [phone]  and follow the prompts.  Office hours are 8:00 a.m. to 4:30 p.m. Monday - Friday. Please note that voicemails left after 4:00 p.m. may not be returned until the following business day.  We are closed weekends and major holidays. You have access to a nurse at all times for urgent questions. Please call the main number to the clinic Dept: [phone] and follow the prompts.   For any non-urgent questions, you may also contact your provider using MyChart. We now offer e-Visits for anyone 31 and older to request care online for non-urgent symptoms. For details visit [email or web address].   Also download the MyChart app! Go to the app store, search MyChart, open the app, select [place], and log in with your MyChart username and password.

## 2024-07-09 LAB — CANCER ANTIGEN 19-9: CA 19-9: 342 U/mL — ABNORMAL HIGH (ref 0–35)

## 2024-07-10 ENCOUNTER — Encounter (HOSPITAL_COMMUNITY): Payer: Self-pay | Admitting: Student in an Organized Health Care Education/Training Program

## 2024-07-10 ENCOUNTER — Ambulatory Visit (INDEPENDENT_AMBULATORY_CARE_PROVIDER_SITE_OTHER): Admitting: Student in an Organized Health Care Education/Training Program

## 2024-07-10 VITALS — BP 155/65 | HR 64 | Ht 66.0 in | Wt 177.0 lb

## 2024-07-10 DIAGNOSIS — F17200 Nicotine dependence, unspecified, uncomplicated: Secondary | ICD-10-CM | POA: Diagnosis not present

## 2024-07-10 DIAGNOSIS — G4700 Insomnia, unspecified: Secondary | ICD-10-CM

## 2024-07-10 MED ORDER — NICOTINE 14 MG/24HR TD PT24: 14.0000 mg | MEDICATED_PATCH | Freq: Every day | TRANSDERMAL | Status: DC

## 2024-07-10 MED ORDER — MIRTAZAPINE 7.5 MG PO TABS: 7.5000 mg | ORAL_TABLET | Freq: Every day | ORAL | 0 refills | Status: DC

## 2024-07-10 NOTE — Progress Notes (Signed)
 BH MD Outpatient Progress Note  07/10/2024 8:58 AM Kathleen Stone  MRN:  [id number]  Assessment:  Kathleen Stone, Kathleen Stone, presents for follow-up evaluation on 07/10/24 .   At today's follow-up, the patient reports continued stability of mood and anxiety symptoms, with no significant changes since the last visit. She remains on Remeron , which is appropriate to continue at the current dose given ongoing benefit for both mood and sleep. She has incorporated CBT-I strategies and melatonin, both of which have contributed to improved sleep quality. No new psychiatric symptoms or safety concerns are reported.  The patient recently initiated chemotherapy and anticipates increased nausea and emotional distress in the coming weeks. She demonstrates insight into these potential challenges and is receptive to ongoing monitoring and support. Plan is to check in within two weeks to assess for any emerging psychiatric or sleep-related concerns during chemotherapy.  Regarding tobacco use, the patient remains in the preparation stage of change and is motivated to reduce and ultimately quit. She prefers nicotine  patches and will initiate with 14 mg patches as discussed.   Identifying Information: Kathleen Stone, Kathleen Stone,  is a 58 y.o. female with a history of psychosis, insomnia and appetite issues who is an established patient with [facility] for management of poor appetite  and insomnia. She is a former pt of Dr. Rainelle.  For a comprehensive history and detailed assessment, please refer to the initial adult assessment.  The patient sees Cozart, Paige Y, LCSW  for therapy.  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  Plan:  # Hx of psychosis #Hypnagogic hallucinations #Insomnia #Appetite suppression Past medication trials:  Status of problem:  unchanged Interventions: -- Continue Remeron  7.5 mg nightly for sleep and appetite -- Continue melatonin 5 mg nightly -- Continue therapy with Paige   # Tobacco use disorder Past medication trials: Status of problem: Action stage of change Interventions: --Start nicotine  patch 14 mg daily  -- Instructed to remove in evenings prior to going to sleep  Patient was given contact information for behavioral health clinic and was instructed to call 911 for emergencies.   Subjective:  Chief Complaint:  No chief complaint on file.   Interval History:  Patient reports she has been tolerating chemotherapy and anticipates that nausea may return Kathleen, as she received her most recent treatment two days ago. She describes her appetite as currently good but expects it may decrease due to ongoing chemotherapy. She reports that melatonin has been helpful for sleep onset and denies experiencing nightmares. She states that Remeron  has been beneficial. She denies depression, anxiety, suicidal ideation, or homicidal ideation. She reports no adverse effects to medications and states she is taking them as directed. She describes continued support from her sisters during this period. She reports being bothered by a neighbor who has made comments she never said and has expressed affection toward her, which makes her feel uncomfortable. She notes the persistent sensation of a presence but states she ignores it. She denies recent substance use.   Visit Diagnosis:  No diagnosis found.   Past Psychiatric History:  Diagnoses: Denies Medication trials: Cymbalta  Previous psychiatrist/therapist: [name], LCSW. Previously saw Renda Pontes for 2-3 months.  Hospitalizations: Denies Suicide attempts: Denies SIB: Denies Hx of violence towards others: Denies Current access to guns: Yes, locked.  Hx of trauma/abuse: Denies Head trauma/Seizures: Denies  Social History   Socioeconomic History   Marital  status: Legally Separated    Spouse name: Not on file   Number of children: Not on file   Years of education: Not on file   Highest education level: Not on file  Occupational History   Occupation: unemployed - applied for disability  Tobacco Use   Smoking status: Every Day    Current packs/day: 1.50    Average packs/day: 1.5 packs/day for 36.0 years (54.0 ttl pk-yrs)    Types: Cigarettes   Smokeless tobacco: Never  Vaping Use   Vaping status: Former  Substance and Sexual Activity   Alcohol use: No    Alcohol/week: 0.0 standard drinks of alcohol   Drug use: No   Sexual activity: Yes    Partners: Female  Other Topics Concern   Not on file  Social History Narrative   Domestic Partner: female has emphysema   Smoked 2 packs/day for 20+ years, down to 1/2ppd now, willing to try to quit    Alcohol use-no   Drug use-no   Regular exercise-no   Social Drivers of Health   Financial Resource Strain: Low Risk  (02/11/2024)   [facility]   Overall Financial Resource Strain (CARDIA)    Difficulty of Paying Living Expenses: Not very hard  Food Insecurity: No Food Insecurity (04/02/2024)   Hunger Vital Sign    Worried About Running Out of Food in the Last Year: Never true    Ran Out of Food in the Last Year: Never true  Transportation Needs: No Transportation Needs (04/02/2024)   PRAPARE - Administrator, Civil Service (Medical): No    Lack of Transportation (Non-Medical): No  Physical Activity: Not on file  Stress: Not on file  Social Connections: Not on file    Allergies:  Allergies  Allergen Reactions   Iohexol  Anaphylaxis, Hives and Other (See Comments)    05/25/24 - Pt given contrast for CT after 13 hr prep, had reaction and needed care in ER per MD evaluation.     Current Medications: Current Outpatient Medications  Medication Sig Dispense Refill   acetaminophen  (TYLENOL ) 325 MG tablet Take by mouth every 6 (six) hours as needed. (Patient not  taking: Reported on 07/08/2024)     albuterol  (VENTOLIN  HFA) 108 (90 Base) MCG/ACT inhaler Inhale 2 puffs into the lungs every 6 (six) hours as needed for wheezing or shortness of breath. 6.7 g 1   amLODipine  (NORVASC ) 5 MG tablet Take 1 tablet (5 mg total) by mouth daily. 30 tablet 11   apixaban  (ELIQUIS ) 5 MG TABS tablet Take 1 tablet (5 mg total) by mouth 2 (two) times daily. (Patient not taking: Reported on 07/08/2024) 60 tablet 3   dexamethasone  (DECADRON ) 4 MG tablet Take 1 tablet (4 mg total) by mouth 2 (two) times daily. Take one tablet twice daily x 3 days after chemo. Start on 06/25/2024 6 tablet 0   EPINEPHrine  0.3 mg/0.3 mL IJ SOAJ injection Inject 0.3 mg into the muscle as needed for anaphylaxis. (Patient not taking: Reported on 07/08/2024) 1 each 0   lidocaine -prilocaine  (EMLA ) cream Apply 1 Application topically as needed. 30 g 1   lipase/protease/amylase (CREON ) 36000 UNITS CPEP capsule Take 2 capsules (72,000 Units total) by mouth 3 (three) times daily with meals. (Patient not taking: Reported on 07/08/2024) 180 capsule 4   loperamide (IMODIUM) 2 MG capsule Take 2-4 mg by mouth as needed for diarrhea or loose stools. Maximum 8/day     Melatonin 5 MG CAPS Take 1  capsule (5 mg total) by mouth at bedtime.     mirtazapine  (REMERON ) 7.5 MG tablet Take 1 tablet (7.5 mg total) by mouth at bedtime. 30 tablet 1   nitroGLYCERIN  (NITROSTAT ) 0.4 MG SL tablet Place 1 tablet (0.4 mg total) under the tongue every 5 (five) minutes as needed for chest pain. (Patient not taking: Reported on 07/08/2024) 25 tablet 1   ondansetron  (ZOFRAN ) 8 MG tablet Take 1 tablet (8 mg total) by mouth every 8 (eight) hours as needed. (Patient not taking: Reported on 07/08/2024) 30 tablet 1   oxyCODONE  (OXY IR/ROXICODONE ) 5 MG immediate release tablet Take 5 mg by mouth every 6 (six) hours as needed for severe pain (pain score 7-10) (Reported by patient).     pantoprazole  (PROTONIX ) 40 MG tablet Take 1 tablet (40 mg total)  by mouth daily. (Patient not taking: Reported on 07/08/2024) 30 tablet 1   potassium chloride  SA (KLOR-CON  M) 20 MEQ tablet Take 1 tablet (20 mEq total) by mouth daily. 30 tablet 2   prochlorperazine  (COMPAZINE ) 10 MG tablet Take 1 tablet (10 mg total) by mouth every 6 (six) hours as needed. 30 tablet 1   RESTASIS 0.05 % ophthalmic emulsion Place 1 drop into both eyes 2 (two) times daily.     No current facility-administered medications for this visit.    ROS: Review of Systems  All other systems reviewed and are negative.   Objective:  Objective: Psychiatric Specialty Exam: General Appearance: Casual, fairly groomed  Eye Contact:  Good    Speech:  Clear, coherent, normal rate, spontaneous  Volume:  Normal   Mood:  see above  Affect:  Appropriate, congruent, full range  Thought Content: Logical, goal-oriented  Suicidal Thoughts: see subjective  Thought Process:  Coherent, goal-directed  Orientation:  A&Ox4   Memory:  Immediate good  Judgment:  Good  Insight:  Good  Concentration:  Attention and concentration good   Recall:  Good  Fund of Knowledge: Good  Language: Good, fluent  Psychomotor Activity: Normal  Akathisia:  NA   AIMS (if indicated): NA   Assets:   Communication Skills Desire for Improvement Housing Resilience  ADL's:  Intact  Cognition: WNL  Sleep: see above  Appetite: see above    Physical Exam Vitals reviewed.  Constitutional:      General: She is not in acute distress.    Appearance: She is not ill-appearing.  HENT:     Head: Normocephalic and atraumatic.  Eyes:     Extraocular Movements: Extraocular movements intact.  Pulmonary:     Effort: Pulmonary effort is normal. No respiratory distress.  Skin:    General: Skin is warm and dry.      Metabolic Disorder Labs: Lab Results  Component Value Date   HGBA1C 4.9 09/09/2023   MPG 93.93 09/09/2023   MPG 119.76 11/16/2021   Lab Results  Component Value Date   PROLACTIN 7.7 10/21/2007    Lab Results  Component Value Date   CHOL 174 11/16/2021   TRIG 149 11/16/2021   HDL 38 (L) 11/16/2021   CHOLHDL 4.6 11/16/2021   VLDL 30 11/16/2021   LDLCALC 106 (H) 11/16/2021   LDLCALC 124 (H) 06/13/2020   Lab Results  Component Value Date   TSH 0.552 05/02/2022   TSH 2.164 11/16/2021    Therapeutic Level Labs: No results found for: LITHIUM No results found for: VALPROATE No results found for: CBMZ  Screenings:  GAD-7    Flowsheet Row Clinical Support from 06/04/2024 in  [facility] Clinical Support from 04/09/2024 in [facility] Counselor from 10/28/2023 in [facility] Office Visit from 11/01/2021 in [facility] - A Dept Of [place]. [facility]  Total GAD-7 Score 1 3 21 19    PHQ2-9    Flowsheet Row Office Visit from 07/08/2024 in [facility] - [facility] Dept Of [place]. [facility] Office Visit from 06/24/2024 in [facility] - A Dept Of [place]. [facility] Office Visit from 06/10/2024 in [facility] - A Dept Of [place]. [facility] Clinical Support from 06/04/2024 in [facility] Office Visit from 05/27/2024 in [facility] - A Dept Of [place]. [facility]  PHQ-2 Total Score 0 0 0 0 0   Flowsheet Row Office Visit from 07/08/2024 in [facility] - A Dept Of [place]. [facility] Office Visit from 06/24/2024 in [facility] - A Dept Of [place]. [facility] Office Visit from 06/10/2024 in [facility] - A Dept Of . [facility]  C-SSRS RISK CATEGORY No Risk No Risk No Risk    Collaboration of Care:   Patient/Guardian was advised Release of Information must be obtained prior to any record release in order to collaborate their care with an outside provider. Patient/Guardian was  advised if they have not already done so to contact the registration department to sign all necessary forms in order for us  to release information regarding their care.   Consent: Patient/Guardian gives verbal consent for treatment and assignment of benefits for services provided during this visit. Patient/Guardian expressed understanding and agreed to proceed.    Marlo Masson, MD 07/10/2024, 8:58 AM

## 2024-07-12 ENCOUNTER — Other Ambulatory Visit: Payer: Self-pay

## 2024-07-17 ENCOUNTER — Telehealth (HOSPITAL_COMMUNITY): Payer: Self-pay | Admitting: Student in an Organized Health Care Education/Training Program

## 2024-07-17 NOTE — Telephone Encounter (Signed)
 Contacted and spoke with patient at her mobile number recorded in chart.  She reports currently tolerating psychotropic regiment, but has experienced some worsening of her perceptual disturbances.  She describes feeling like there is something out to get her in the evenings and when she wakes up, she denies auditory or visual hallucinations. She reports that although this sensation is intensifying, it has not caused impairment in social functioning.  She is aware that her mind may be playing tricks on her and this may be related to her brain tumor.  She was advised and encouraged to contact her clinic if the symptoms worsen.  Otherwise she denies SI and HI.  There were no other safety concerns.  Nara Paternoster Carrin Carrero, MD PGY-3, [facility]

## 2024-07-19 NOTE — Progress Notes (Deleted)
 [facility]   [phone] [phone]    Patient Care Team: Harrie Bruckner, DO as PCP - General (Internal Medicine) Alvia Olam BIRCH, RN as VBCI Care Management   CHIEF COMPLAINT: Follow up pancreatic cancer   CURRENT THERAPY: Gemcitabine /Abraxane   INTERVAL HISTORY   ROS   Past Medical History:  Diagnosis Date   Abnormal stress test 01/16/2017   Brain tumor (benign) (HCC)    DJD (degenerative joint disease) of cervical spine    Galactorrhea of both breasts 2009   R. sided w/ benign papilloma excised in 4/09. L sided in 5/09   Hematuria, microscopic 02/24/2020   Hyperlipidemia    Hypertension    Hypokalemia    LOW BACK PAIN SYNDROME 07/10/2007   Qualifier: Diagnosis of   By: Lanis MD, Christopher         Nausea and vomiting 08/01/2022   Pancreatic cancer [facility])    Personal history of adenomatous colonic polyp    09/2007 - diminutive adenoma [facility])   Stroke [facility])    2021- brain tumor bx caused ? stroke-    Tobacco abuse    Unintentional weight loss 05/26/2022     Past Surgical History:  Procedure Laterality Date   APPENDECTOMY     BIOPSY  08/02/2022   Procedure: BIOPSY;  Surgeon: Wilhelmenia Aloha Raddle., MD;  Location: [facility];  Service: Gastroenterology;;   BIOPSY  04/17/2023   Procedure: BIOPSY;  Surgeon: Wilhelmenia Aloha Raddle., MD;  Location: [facility];  Service: Gastroenterology;;   BRAIN BIOPSY     [phone]   BREAST SURGERY Right 12/2007   central duct excision   CHOLECYSTECTOMY N/A 12/08/2019   Procedure: LAPAROSCOPIC CHOLECYSTECTOMY;  Surgeon: Rubin Calamity, MD;  Location: [facility] OR;  Service: General;  Laterality: N/A;   COLONOSCOPY  2009   COLONOSCOPY  09/14/2020   COLONOSCOPY W/ BIOPSIES AND POLYPECTOMY  09/2007   Dr. Avram. 2 small adenomatous polyps, internal hemorrhoids-Grade 1. Rec  routine colonoscopy at age 79.   COLONOSCOPY W/ BIOPSIES AND POLYPECTOMY     ESOPHAGOGASTRODUODENOSCOPY (EGD) WITH  PROPOFOL  N/A 08/02/2022   Procedure: ESOPHAGOGASTRODUODENOSCOPY (EGD) WITH PROPOFOL ;  Surgeon: Wilhelmenia Aloha Raddle., MD;  Location: [facility];  Service: Gastroenterology;  Laterality: N/A;   ESOPHAGOGASTRODUODENOSCOPY (EGD) WITH PROPOFOL  N/A 04/17/2023   Procedure: ESOPHAGOGASTRODUODENOSCOPY (EGD) WITH PROPOFOL ;  Surgeon: Wilhelmenia Aloha Raddle., MD;  Location: [facility];  Service: Gastroenterology;  Laterality: N/A;   EUS N/A 08/02/2022   Procedure: FULL UPPER ENDOSCOPIC ULTRASOUND (EUS) RADIAL;  Surgeon: Wilhelmenia Aloha Raddle., MD;  Location: [facility];  Service: Gastroenterology;  Laterality: N/A;   EUS N/A 04/17/2023   Procedure: UPPER ENDOSCOPIC ULTRASOUND (EUS) RADIAL;  Surgeon: Wilhelmenia Aloha Raddle., MD;  Location: [facility];  Service: Gastroenterology;  Laterality: N/A;   FIDUCIAL MARKER PLACEMENT N/A 04/17/2023   Procedure: FIDUCIAL MARKER PLACEMENT;  Surgeon: Wilhelmenia Aloha Raddle., MD;  Location: [facility];  Service: Gastroenterology;  Laterality: N/A;   FINE NEEDLE ASPIRATION N/A 08/02/2022   Procedure: FINE NEEDLE ASPIRATION (FNA) LINEAR;  Surgeon: Wilhelmenia Aloha Raddle., MD;  Location: [facility];  Service: Gastroenterology;  Laterality: N/A;   IR IMAGING GUIDED PORT INSERTION  08/03/2022   NASAL SEPTOPLASTY W/ TURBINOPLASTY Bilateral 04/2008   Septoplasty with bilateral inferior turbinate reduction; Dr. Ethyl   POLYPECTOMY  08/02/2022   Procedure: POLYPECTOMY;  Surgeon: Wilhelmenia Aloha Raddle., MD;  Location: [facility];  Service: Gastroenterology;;   SHOULDER OPEN ROTATOR CUFF REPAIR Right     after hurting  it by moving heavy objecs     Outpatient Encounter Medications as of 07/22/2024  Medication Sig Note   acetaminophen  (TYLENOL ) 325 MG tablet Take by mouth every 6 (six) hours as needed. (Patient not taking: Reported on 07/08/2024)    albuterol  (VENTOLIN  HFA) 108 (90 Base) MCG/ACT inhaler Inhale 2 puffs into the lungs every 6 (six) hours as needed for  wheezing or shortness of breath.    amLODipine  (NORVASC ) 5 MG tablet Take 1 tablet (5 mg total) by mouth daily.    apixaban  (ELIQUIS ) 5 MG TABS tablet Take 1 tablet (5 mg total) by mouth 2 (two) times daily. (Patient not taking: Reported on 07/08/2024) 02/21/2024: On Hold x 2 weeks   dexamethasone  (DECADRON ) 4 MG tablet Take 1 tablet (4 mg total) by mouth 2 (two) times daily. Take one tablet twice daily x 3 days after chemo. Start on 06/25/2024    EPINEPHrine  0.3 mg/0.3 mL IJ SOAJ injection Inject 0.3 mg into the muscle as needed for anaphylaxis. (Patient not taking: Reported on 07/08/2024)    lidocaine -prilocaine  (EMLA ) cream Apply 1 Application topically as needed.    lipase/protease/amylase (CREON ) 36000 UNITS CPEP capsule Take 2 capsules (72,000 Units total) by mouth 3 (three) times daily with meals. (Patient not taking: Reported on 07/08/2024)    loperamide (IMODIUM) 2 MG capsule Take 2-4 mg by mouth as needed for diarrhea or loose stools. Maximum 8/day    Melatonin 5 MG CAPS Take 1 capsule (5 mg total) by mouth at bedtime.    mirtazapine  (REMERON ) 7.5 MG tablet Take 1 tablet (7.5 mg total) by mouth at bedtime.    nicotine  (NICODERM CQ  - DOSED IN MG/24 HOURS) 14 mg/24hr patch Place 1 patch (14 mg total) onto the skin daily.    nitroGLYCERIN  (NITROSTAT ) 0.4 MG SL tablet Place 1 tablet (0.4 mg total) under the tongue every 5 (five) minutes as needed for chest pain. (Patient not taking: Reported on 07/08/2024)    ondansetron  (ZOFRAN ) 8 MG tablet Take 1 tablet (8 mg total) by mouth every 8 (eight) hours as needed. (Patient not taking: Reported on 07/08/2024)    oxyCODONE  (OXY IR/ROXICODONE ) 5 MG immediate release tablet Take 5 mg by mouth every 6 (six) hours as needed for severe pain (pain score 7-10) (Reported by patient).    pantoprazole  (PROTONIX ) 40 MG tablet Take 1 tablet (40 mg total) by mouth daily. (Patient not taking: Reported on 07/08/2024)    potassium chloride  SA (KLOR-CON  M) 20 MEQ tablet  Take 1 tablet (20 mEq total) by mouth daily.    prochlorperazine  (COMPAZINE ) 10 MG tablet Take 1 tablet (10 mg total) by mouth every 6 (six) hours as needed.    RESTASIS 0.05 % ophthalmic emulsion Place 1 drop into both eyes 2 (two) times daily. 07/08/2024: Uses prn   No facility-administered encounter medications on file as of 07/22/2024.     There were no vitals filed for this visit. There is no height or weight on file to calculate BMI.   ECOG PERFORMANCE STATUS: {CHL ONC ECOG PS:[phone]}  PHYSICAL EXAM GENERAL:alert, no distress and comfortable SKIN: no rash  EYES: sclera clear NECK: without mass LYMPH:  no palpable cervical or supraclavicular lymphadenopathy  LUNGS: clear with normal breathing effort HEART: regular rate & rhythm, no lower extremity edema ABDOMEN: abdomen soft, non-tender and normal bowel sounds NEURO: alert & oriented x 3 with fluent speech, no focal motor/sensory deficits Breast exam:  PAC without erythema    CBC    Latest  Ref Rng & Units 07/08/2024   10:23 AM 06/24/2024   10:18 AM 06/10/2024   10:55 AM  CBC  WBC 4.0 - 10.5 K/uL 6.8  8.0  7.4   Hemoglobin 12.0 - 15.0 g/dL 89.2  89.0  89.5   Hematocrit 36.0 - 46.0 % 32.8  33.5  31.5   Platelets 150 - 400 K/uL 137  159  139       CMP     Latest Ref Rng & Units 07/08/2024   10:23 AM 06/24/2024   10:18 AM 06/10/2024   10:55 AM  CMP  Glucose 70 - 99 mg/dL 880  815  897   BUN 6 - 20 mg/dL 9  9  9    Creatinine 0.44 - 1.00 mg/dL 9.32  9.21  9.24   Sodium 135 - 145 mmol/L 144  143  143   Potassium 3.5 - 5.1 mmol/L 3.0  3.3  3.3   Chloride 98 - 111 mmol/L 108  107  107   CO2 22 - 32 mmol/L 28  22  27    Calcium  8.9 - 10.3 mg/dL 9.4  9.4  9.4   Total Protein 6.5 - 8.1 g/dL 6.4  6.8  6.5   Total Bilirubin 0.0 - 1.2 mg/dL 0.5  0.5  0.5   Alkaline Phos 38 - 126 U/L 90  95  90   AST 15 - 41 U/L 19  21  19    ALT 0 - 44 U/L 12  13  13        ASSESSMENT & PLAN: Pancreas cancer-T4 N1 CT abdomen/pelvis  07/30/2022-body/neck pancreas mass with peripheral pancreatic atrophy and duct dilation, suspected encasement of the celiac axis, uterine fibroids MRI abdomen 07/30/2022-3.7 x 4.7 cm pancreas body/tail mass with encasement of the proximal celiac artery, occlusion with collateralization at the confluence of the SMV and portal vein, no evidence of metastatic disease CT chest 08/01/2022-no intrathoracic metastases Elevated CA 19-9 EUS 08/02/2022-gastric polyp, gastric erosions, 32 x 33 mm pancreas body mass, invasion of the celiac trunk, invasion of the splenoportal confluence, no SMA invasion, 2 malignant appearing nodes in the porta hepatis,T4N1 by endoscopic criteria, FNA biopsy-adenocarcinoma 08/17/2022 [employer]-tumor mutation burden 0.96; MSI high not detected; 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  Stable porta hepatis lymph nodes. Cycle 7 gemcitabine /Abraxane  11/09/2022 Cycle 8 gemcitabine /Abraxane  11/23/2022 Cycle 9 gemcitabine /Abraxane  12/07/2022 Cycle 10 gemcitabine /Abraxane  12/21/2022 Cycle 11 gemcitabine /Abraxane  01/04/2023 Cycle 12 gemcitabine /Abraxane  01/18/2023 Cycle 13 gemcitabine /Abraxane  02/01/2023 Cycle 14 gemcitabine /Abraxane  02/15/2023 CT abdomen/pelvis 02/26/2023-stable appearance of pancreas body mass with vascular encasement and chronic occlusion of the splenic vein, no evidence of metastatic disease Cycle 15 gemcitabine /Abraxane  03/01/2023 Cycle 16 gemcitabine /Abraxane  03/15/2023 Cycle 17 Gemcitabine /Abraxane  03/29/2023 EUS pancreas fiducial marker placement 04/17/2023 Pancreas SBRT  05/07/2023 - 05/17/2023, 33 Gray in 5 fractions CT abdomen/pelvis 09/14/2023: Slight decrease in pancreas body mass with persistent vascular encasement, no evidence of metastatic disease CTs [facility] 01/02/2024-ill-defined hypoattenuating mass in the mid pancreatic body surrounding surgical clips.  Pancreatic duct within the distal pancreatic body and tail is dilated.  Mass encases the celiac trunk.  Common bile duct is dilated with suggestion of focal stenosis in the mid bile duct.  High-grade stenosis/occlusion of the portal vein.  Prominent vessels in the upper abdomen.  Heterogeneous appearance of the splenic parenchyma with suggestion of an infiltrating hypoattenuating mass/masses versus sequela of splenic infarcts.  Multiple pulmonary nodules in the lung bases concerning for metastatic disease. CTs chest/abdomen/pelvis 01/06/2024-no PE.  Cardiomegaly with small pericardial effusion.  Small irregular focus of density medial right lung base.  Poorly defined pancreatic body mass measuring 3 x 2.2 cm, previously 3 x 2.5 cm, possible extension of infiltrative soft tissue towards the pancreatic neck region with ill-defined density at the porta hepatis compared to prior.  Slight increased extrahepatic biliary and common duct dilatation compared to prior.  No focal splenic abnormality. Cycle 1 gemcitabine /abraxane  02/07/2024 Cycle 2 gemcitabine /Abraxane  03/18/2024 Cycle 3 gemcitabine /Abraxane  04/01/2024, premed changed from Compazine  to Zofran  Cycle 4 gemcitabine /Abraxane  04/14/2024, premedication changed to Aloxi /Decadron  Cycle 5 gemcitabine /Abraxane  04/29/2024, premedication continued Aloxi /Decadron  Cycle 6 gemcitabine /Abraxane  05/14/2024, premedication continued Aloxi /Decadron  CTs 05/25/2024-stable right middle lobe and right lower lobe pulmonary lesions. No new pulmonary lesions. Decrease in size of pancreatic head/body junction lesion and tumor extending down along the celiac axis. No findings for  hepatic metastatic disease.  Cycle 7 gemcitabine /Abraxane  05/27/2024, Gemcitabine  dose reduced due to thrombocytopenia Cycle 8 gemcitabine /Abraxane  06/10/2024 Cycle 9 gemcitabine /Abraxane  06/24/2024 Cycle 10 gemcitabine /Abraxane  07/08/2024     2.  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  Similar size of of enhancing mass in the dorsal right midbrain effacing the cerebral aqueduct.  No new enhancing lesions identified.  She was [facility] to [facility].  She underwent an endoscopic third ventriculostomy on 02/12/2024.   Brain CT 03/13/2024-interval resolution of obstructive hydrocephalus and transependymal edema.  Mild interval decrease in size of a mass within the right midbrain, compatible with glioma.  Chronic encephalomalacia changes in the right frontal lobe. 02/12/2024: Endoscopic ventriculostomy at [facility]   3.  Anorexia/weight loss secondary to #1 4.  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  Abdominal pain 6.  History of colon polyps 7.  Family history of multiple cancers Pancreas cancer maternal aunt, maternal cousin, maternal grandfather Breast cancer-maternal aunt, maternal cousin Prostate cancer-maternal uncle Lung cancer-father 8.  Hypertension 9.  Postmenopausal vaginal bleeding 10.  H. pylori 08/02/2022-amoxicillin  and Flagyl  discontinued 08/21/2022 due to GI symptoms 11.  Syncope event 03/07/2023 12.  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  Anaphylaxis following CT contrast 05/25/2024  PLAN:  No orders of the defined types were placed in this encounter.     All questions were answered. The patient knows to call the clinic with any problems, questions or concerns. No barriers to learning were detected. I spent *** counseling the patient face to face. The total time spent in the  appointment was *** and more than 50% was on counseling, review of test results, and coordination of care.   Ife Vitelli K Anas Reister, NP 07/19/2024 2:19 PM

## 2024-07-22 ENCOUNTER — Inpatient Hospital Stay: Attending: Oncology

## 2024-07-22 ENCOUNTER — Inpatient Hospital Stay

## 2024-07-22 ENCOUNTER — Inpatient Hospital Stay: Admitting: Oncology

## 2024-07-22 ENCOUNTER — Inpatient Hospital Stay: Admitting: Nurse Practitioner

## 2024-07-22 VITALS — BP 167/69 | HR 63 | Temp 97.9°F | Resp 18

## 2024-07-22 VITALS — BP 138/68 | HR 85 | Temp 97.8°F | Resp 18 | Ht 66.0 in | Wt 178.0 lb

## 2024-07-22 DIAGNOSIS — R202 Paresthesia of skin: Secondary | ICD-10-CM | POA: Insufficient documentation

## 2024-07-22 DIAGNOSIS — Z8042 Family history of malignant neoplasm of prostate: Secondary | ICD-10-CM | POA: Diagnosis not present

## 2024-07-22 DIAGNOSIS — R978 Other abnormal tumor markers: Secondary | ICD-10-CM | POA: Diagnosis not present

## 2024-07-22 DIAGNOSIS — Z5111 Encounter for antineoplastic chemotherapy: Secondary | ICD-10-CM | POA: Diagnosis present

## 2024-07-22 DIAGNOSIS — R200 Anesthesia of skin: Secondary | ICD-10-CM | POA: Insufficient documentation

## 2024-07-22 DIAGNOSIS — Z800 Family history of malignant neoplasm of digestive organs: Secondary | ICD-10-CM | POA: Diagnosis not present

## 2024-07-22 DIAGNOSIS — C251 Malignant neoplasm of body of pancreas: Secondary | ICD-10-CM | POA: Diagnosis present

## 2024-07-22 DIAGNOSIS — Z801 Family history of malignant neoplasm of trachea, bronchus and lung: Secondary | ICD-10-CM | POA: Insufficient documentation

## 2024-07-22 DIAGNOSIS — E876 Hypokalemia: Secondary | ICD-10-CM | POA: Insufficient documentation

## 2024-07-22 DIAGNOSIS — R197 Diarrhea, unspecified: Secondary | ICD-10-CM | POA: Diagnosis not present

## 2024-07-22 DIAGNOSIS — Z803 Family history of malignant neoplasm of breast: Secondary | ICD-10-CM | POA: Diagnosis not present

## 2024-07-22 LAB — CBC WITH DIFFERENTIAL (CANCER CENTER ONLY)
Abs Immature Granulocytes: 0.04 K/uL (ref 0.00–0.07)
Basophils Absolute: 0 K/uL (ref 0.0–0.1)
Basophils Relative: 0 %
Eosinophils Absolute: 0.1 K/uL (ref 0.0–0.5)
Eosinophils Relative: 1 %
HCT: 32 % — ABNORMAL LOW (ref 36.0–46.0)
Hemoglobin: 10.6 g/dL — ABNORMAL LOW (ref 12.0–15.0)
Immature Granulocytes: 1 %
Lymphocytes Relative: 16 %
Lymphs Abs: 1.4 K/uL (ref 0.7–4.0)
MCH: 31.5 pg (ref 26.0–34.0)
MCHC: 33.1 g/dL (ref 30.0–36.0)
MCV: 95 fL (ref 80.0–100.0)
Monocytes Absolute: 0.7 K/uL (ref 0.1–1.0)
Monocytes Relative: 8 %
Neutro Abs: 6.1 K/uL (ref 1.7–7.7)
Neutrophils Relative %: 74 %
Platelet Count: 128 K/uL — ABNORMAL LOW (ref 150–400)
RBC: 3.37 MIL/uL — ABNORMAL LOW (ref 3.87–5.11)
RDW: 16.7 % — ABNORMAL HIGH (ref 11.5–15.5)
WBC Count: 8.3 K/uL (ref 4.0–10.5)
nRBC: 0 % (ref 0.0–0.2)

## 2024-07-22 LAB — CMP (CANCER CENTER ONLY)
ALT: 21 U/L (ref 0–44)
AST: 29 U/L (ref 15–41)
Albumin: 3.6 g/dL (ref 3.5–5.0)
Alkaline Phosphatase: 100 U/L (ref 38–126)
Anion gap: 11 (ref 5–15)
BUN: 8 mg/dL (ref 6–20)
CO2: 26 mmol/L (ref 22–32)
Calcium: 9.3 mg/dL (ref 8.9–10.3)
Chloride: 107 mmol/L (ref 98–111)
Creatinine: 0.74 mg/dL (ref 0.44–1.00)
GFR, Estimated: >60 mL/min (ref >60)
Glucose, Bld: 171 mg/dL — ABNORMAL HIGH (ref 70–99)
Potassium: 3.1 mmol/L — ABNORMAL LOW (ref 3.5–5.1)
Sodium: 144 mmol/L (ref 135–145)
Total Bilirubin: 0.5 mg/dL (ref 0.0–1.2)
Total Protein: 6.5 g/dL (ref 6.5–8.1)

## 2024-07-22 MED ORDER — DEXAMETHASONE SOD PHOSPHATE PF 10 MG/ML IJ SOLN
10.0000 mg | Freq: Once | INTRAMUSCULAR | Status: AC
  Administered 2024-07-22: 10 mg via INTRAVENOUS

## 2024-07-22 MED ORDER — SODIUM CHLORIDE 0.9 % IV SOLN: Freq: Once | INTRAVENOUS | Status: AC

## 2024-07-22 MED ORDER — PALONOSETRON HCL INJECTION 0.25 MG/5ML
0.2500 mg | Freq: Once | INTRAVENOUS | Status: AC
  Administered 2024-07-22: 0.25 mg via INTRAVENOUS
  Filled 2024-07-22: qty 5

## 2024-07-22 MED ORDER — SODIUM CHLORIDE 0.9 % IV SOLN
800.0000 mg/m2 | Freq: Once | INTRAVENOUS | Status: AC
  Administered 2024-07-22: 1520 mg via INTRAVENOUS
  Filled 2024-07-22: qty 25.98

## 2024-07-22 MED ORDER — PACLITAXEL PROTEIN-BOUND CHEMO INJECTION 100 MG
100.0000 mg/m2 | Freq: Once | INTRAVENOUS | Status: AC
  Administered 2024-07-22: 200 mg via INTRAVENOUS
  Filled 2024-07-22: qty 40

## 2024-07-22 NOTE — Patient Instructions (Signed)
 [facility] - A DEPT OF [facility]  Discharge Instructions: Thank you for choosing [facility] to provide your oncology and hematology care.   If you have a lab appointment with the Cancer Center, please go directly to the Cancer Center and check in at the registration area.   Wear comfortable clothing and clothing appropriate for easy access to any Portacath or PICC line.   We strive to give you quality time with your provider. You may need to reschedule your appointment if you arrive late (15 or more minutes).  Arriving late affects you and other patients whose appointments are after yours.  Also, if you miss three or more appointments without notifying the office, you may be dismissed from the clinic at the provider's discretion.      For prescription refill requests, have your pharmacy contact our office and allow 72 hours for refills to be completed.    Today you received the following chemotherapy and/or immunotherapy agents: abraxane  and gemzar       To help prevent nausea and vomiting after your treatment, we encourage you to take your nausea medication as directed.  BELOW ARE SYMPTOMS THAT SHOULD BE REPORTED IMMEDIATELY: *FEVER GREATER THAN 100.4 F (38 C) OR HIGHER *CHILLS OR SWEATING *NAUSEA AND VOMITING THAT IS NOT CONTROLLED WITH YOUR NAUSEA MEDICATION *UNUSUAL SHORTNESS OF BREATH *UNUSUAL BRUISING OR BLEEDING *URINARY PROBLEMS (pain or burning when urinating, or frequent urination) *BOWEL PROBLEMS (unusual diarrhea, constipation, pain near the anus) TENDERNESS IN MOUTH AND THROAT WITH OR WITHOUT PRESENCE OF ULCERS (sore throat, sores in mouth, or a toothache) UNUSUAL RASH, SWELLING OR PAIN  UNUSUAL VAGINAL DISCHARGE OR ITCHING   Items with * indicate a potential emergency and should be followed up as soon as possible or go to the Emergency Department if any problems should occur.  Please show the CHEMOTHERAPY ALERT CARD or  IMMUNOTHERAPY ALERT CARD at check-in to the Emergency Department and triage nurse.  Should you have questions after your visit or need to cancel or reschedule your appointment, please contact [facility] - A DEPT OF [facility]  Dept: [phone]  and follow the prompts.  Office hours are 8:00 a.m. to 4:30 p.m. Monday - Friday. Please note that voicemails left after 4:00 p.m. may not be returned until the following business day.  We are closed weekends and major holidays. You have access to a nurse at all times for urgent questions. Please call the main number to the clinic Dept: [phone] and follow the prompts.   For any non-urgent questions, you may also contact your provider using MyChart. We now offer e-Visits for anyone 77 and older to request care online for non-urgent symptoms. For details visit [email or web address].   Also download the MyChart app! Go to the app store, search MyChart, open the app, select [place], and log in with your MyChart username and password.

## 2024-07-22 NOTE — Progress Notes (Signed)
 [facility] OFFICE PROGRESS NOTE   Diagnosis: Pancreas cancer  INTERVAL HISTORY:   Ms. Colon completed another cycle of gemcitabine /Abraxane  07/08/2024.  She has diarrhea after meals.  The diarrhea is relieved with Imodium.  She is not taking Creon .  She has intermittent numbness in the extremities.  No new complaint.  Objective:  Vital signs in last 24 hours:  Blood pressure 138/68, pulse 85, temperature 97.8 F (36.6 C), temperature source Temporal, resp. rate 18, height 5' 6 (1.676 m), weight 178 lb (80.7 kg), last menstrual period 02/07/2016, SpO2 100%.    HEENT: No thrush Resp: Lungs clear bilaterally Cardio: Regular rate and rhythm GI: No hepatosplenomegaly, nontender Vascular: No leg edema  Portacath/PICC-without erythema  Lab Results:  Lab Results  Component Value Date   WBC 8.3 07/22/2024   HGB 10.6 (L) 07/22/2024   HCT 32.0 (L) 07/22/2024   MCV 95.0 07/22/2024   PLT 128 (L) 07/22/2024   NEUTROABS 6.1 07/22/2024    CMP  Lab Results  Component Value Date   NA 144 07/08/2024   K 3.0 (L) 07/08/2024   CL 108 07/08/2024   CO2 28 07/08/2024   GLUCOSE 119 (H) 07/08/2024   BUN 9 07/08/2024   CREATININE 0.67 07/08/2024   CALCIUM  9.4 07/08/2024   PROT 6.4 (L) 07/08/2024   ALBUMIN 3.5 07/08/2024   AST 19 07/08/2024   ALT 12 07/08/2024   ALKPHOS 90 07/08/2024   BILITOT 0.5 07/08/2024   GFRNONAA >60 07/08/2024   GFRAA >60 02/29/2020    Lab Results  Component Value Date   CEA1 27.4 (H) 07/31/2022   RJW800 342 (H) 07/08/2024     Medications: I have reviewed the patient's current medications.   Assessment/Plan:  Pancreas cancer-T4 N1 CT abdomen/pelvis 07/30/2022-body/neck pancreas mass with peripheral pancreatic atrophy and duct dilation, suspected encasement of the celiac axis, uterine fibroids MRI abdomen 07/30/2022-3.7 x 4.7 cm pancreas body/tail mass with encasement of the proximal celiac artery, occlusion with collateralization  at the confluence of the SMV and portal vein, no evidence of metastatic disease CT chest 08/01/2022-no intrathoracic metastases Elevated CA 19-9 EUS 08/02/2022-gastric polyp, gastric erosions, 32 x 33 mm pancreas body mass, invasion of the celiac trunk, invasion of the splenoportal confluence, no SMA invasion, 2 malignant appearing nodes in the porta hepatis,T4N1 by endoscopic criteria, FNA biopsy-adenocarcinoma 08/17/2022 [employer]360-tumor mutation burden 0.96; MSI high not detected; 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  Stable porta hepatis lymph nodes. Cycle 7 gemcitabine /Abraxane  11/09/2022 Cycle 8 gemcitabine /Abraxane  11/23/2022 Cycle 9 gemcitabine /Abraxane  12/07/2022 Cycle 10 gemcitabine /Abraxane  12/21/2022 Cycle 11 gemcitabine /Abraxane  01/04/2023 Cycle 12 gemcitabine /Abraxane  01/18/2023 Cycle 13 gemcitabine /Abraxane  02/01/2023 Cycle 14 gemcitabine /Abraxane  02/15/2023 CT abdomen/pelvis 02/26/2023-stable appearance of pancreas body mass with vascular encasement and chronic occlusion of the splenic vein, no evidence of metastatic disease Cycle 15 gemcitabine /Abraxane  03/01/2023 Cycle 16 gemcitabine /Abraxane  03/15/2023 Cycle 17 Gemcitabine /Abraxane  03/29/2023 EUS pancreas fiducial marker placement 04/17/2023 Pancreas SBRT 05/07/2023 - 05/17/2023, 33 Gray in 5 fractions CT abdomen/pelvis 09/14/2023: Slight decrease in pancreas body mass with persistent vascular encasement, no evidence of metastatic disease CTs [facility] 01/02/2024-ill-defined hypoattenuating mass in the mid pancreatic body  surrounding surgical clips.  Pancreatic duct within the distal pancreatic body and tail is dilated.  Mass encases the celiac trunk.  Common bile duct is dilated with suggestion of focal stenosis in the mid bile duct.  High-grade stenosis/occlusion of the portal vein.  Prominent vessels in the upper abdomen.  Heterogeneous appearance of the splenic parenchyma with suggestion of an infiltrating hypoattenuating mass/masses versus sequela of splenic infarcts.  Multiple pulmonary nodules in the lung bases concerning for metastatic disease. CTs chest/abdomen/pelvis 01/06/2024-no PE.  Cardiomegaly with small pericardial effusion.  Small irregular focus of density medial right lung base.  Poorly defined pancreatic body mass measuring 3 x 2.2 cm, previously 3 x 2.5 cm, possible extension of infiltrative soft tissue towards the pancreatic neck region with ill-defined density at the porta hepatis compared to prior.  Slight increased extrahepatic biliary and common duct dilatation compared to prior.  No focal splenic abnormality. Cycle 1 gemcitabine /abraxane  02/07/2024 Cycle 2 gemcitabine /Abraxane  03/18/2024 Cycle 3 gemcitabine /Abraxane  04/01/2024, premed changed from Compazine  to Zofran  Cycle 4 gemcitabine /Abraxane  04/14/2024, premedication changed to Aloxi /Decadron  Cycle 5 gemcitabine /Abraxane  04/29/2024, premedication continued Aloxi /Decadron  Cycle 6 gemcitabine /Abraxane  05/14/2024, premedication continued Aloxi /Decadron  CTs 05/25/2024-stable right middle lobe and right lower lobe pulmonary lesions. No new pulmonary lesions. Decrease in size of pancreatic head/body junction lesion and tumor extending down along the celiac axis. No findings for hepatic metastatic disease.  Cycle 7 gemcitabine /Abraxane  05/27/2024, Gemcitabine  dose reduced due to thrombocytopenia Cycle 8 gemcitabine /Abraxane  06/10/2024 Cycle 9 gemcitabine /Abraxane  06/24/2024 Cycle 10 gemcitabine /Abraxane  07/08/2024 Cycle 11 gemcitabine /Abraxane  07/22/2024      2.  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  Similar size of of enhancing mass in the dorsal right midbrain effacing the cerebral aqueduct.  No new enhancing lesions identified.  She was transferred to [facility].  She underwent an endoscopic third ventriculostomy on 02/12/2024.   Brain CT 03/13/2024-interval resolution of obstructive hydrocephalus and transependymal edema.  Mild interval decrease in size of a mass within the right midbrain, compatible with glioma.  Chronic encephalomalacia changes in the right frontal lobe. 02/12/2024: Endoscopic ventriculostomy at [facility]   3.  Anorexia/weight loss secondary to #1 4.  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  Abdominal pain 6.  History of colon polyps 7.  Family history of multiple cancers Pancreas cancer maternal aunt, maternal cousin, maternal grandfather Breast cancer-maternal aunt, maternal cousin Prostate cancer-maternal uncle Lung cancer-father 8.  Hypertension 9.  Postmenopausal vaginal bleeding 10.  H. pylori 08/02/2022-amoxicillin  and Flagyl  discontinued 08/21/2022 due to GI symptoms 11.  Syncope event 03/07/2023 12.  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  Anaphylaxis following CT contrast 05/25/2024    Disposition: Kathleen Stone appears stable.  She continues to tolerate the gemcitabine /Abraxane  well.  There is no clinical evidence for progression of the pancreas cancer.  The CA 19-9 was lower on 07/08/2024.  She will complete another cycle of gemcitabine /Abraxane  today.  I recommended she resume Creon  prior to meals.  Ms. Kozlowski will return for an office visit and chemotherapy in 2 weeks.  Arley Hof, MD  07/22/2024  10:30 AM

## 2024-07-22 NOTE — Patient Instructions (Signed)

## 2024-07-22 NOTE — Progress Notes (Signed)
 Patient seen by Dr. Arley Hof today  Vitals are within treatment parameters:Yes   Labs are within treatment parameters: No (Please specify and give further instructions.)  K+ 3.1--OK to proceed  Treatment plan has been signed: Yes   Per physician team, Patient is ready for treatment and there are NO modifications to the treatment plan.

## 2024-07-23 LAB — CANCER ANTIGEN 19-9: CA 19-9: 353 U/mL — ABNORMAL HIGH (ref 0–35)

## 2024-07-28 ENCOUNTER — Other Ambulatory Visit: Payer: Self-pay | Admitting: *Deleted

## 2024-07-28 NOTE — Patient Instructions (Signed)
 Visit Information  Thank you for taking time to visit with me today. Please don't hesitate to contact me if I can be of assistance to you before our next scheduled appointment.  Your next care management appointment is by telephone on 08/26/2024 at 10:30 am  Please call the care guide team at [phone] if you need to cancel, schedule, or reschedule an appointment.   Please call the Suicide and Crisis Lifeline: 988 call the [place]  National Suicide Prevention Lifeline: 539-046-8802 or TTY: (938)552-9199 TTY (618)046-5004) to talk to a trained counselor call 1-800-273-TALK (toll free, 24 hour hotline) if you are experiencing a Mental Health or Behavioral Health Crisis or need someone to talk to. Olam Ku, RN, BSN [place]  [facility], [facility] RN Care Manager Direct Dial: [phone]  Fax: [phone]

## 2024-07-28 NOTE — Patient Outreach (Signed)
 Complex Care Management   Visit Note  07/28/2024  Name:  Kathleen Stone MRN: [id number] DOB: 08-11-1966  Situation: Referral received for Complex Care Management related to Pancreatic Mass. I obtained verbal consent from Patient.  Visit completed with Patient  on the phone  Background:   Past Medical History:  Diagnosis Date   Abnormal stress test 01/16/2017   Brain tumor (benign) (HCC)    DJD (degenerative joint disease) of cervical spine    Galactorrhea of both breasts 2009   R. sided w/ benign papilloma excised in 4/09. L sided in 5/09   Hematuria, microscopic 02/24/2020   Hyperlipidemia    Hypertension    Hypokalemia    LOW BACK PAIN SYNDROME 07/10/2007   Qualifier: Diagnosis of   By: Lanis MD, Christopher         Nausea and vomiting 08/01/2022   Pancreatic cancer [facility])    Personal history of adenomatous colonic polyp    09/2007 - diminutive adenoma [facility])   Stroke [facility])    2021- brain tumor bx caused ? stroke-    Tobacco abuse    Unintentional weight loss 05/26/2022    Assessment:  Patient Reported Symptoms:  Cognitive Cognitive Status: Able to follow simple commands, Alert and oriented to person, place, and time, Normal speech and language skills   Health Maintenance Behaviors: Annual physical exam  Neurological Neurological Review of Symptoms: No symptoms reported Neurological Management Strategies: Routine screening  HEENT HEENT Symptoms Reported: No symptoms reported HEENT Management Strategies: Routine screening    Cardiovascular Cardiovascular Symptoms Reported: No symptoms reported Does patient have uncontrolled Hypertension?: Yes Patient's Recent BP reading at home: Last bp at the provider's office 167/69 as pt remains asymptomatic Cardiovascular Management Strategies: Medication therapy, Routine screening Weight: 178 lb (80.7 kg)  Respiratory Respiratory Symptoms Reported: No symptoms reported    Endocrine Endocrine Symptoms Reported: No symptoms  reported Is patient diabetic?: No    Gastrointestinal Gastrointestinal Symptoms Reported: Diarrhea Additional Gastrointestinal Details: Ongoing throughout her treatment. Takes OTC medication as needed to assist with her ongoing diarrhea. Gastrointestinal Management Strategies: Medication therapy    Genitourinary Genitourinary Symptoms Reported: No symptoms reported    Integumentary Integumentary Symptoms Reported: No symptoms reported Skin Management Strategies: Routine screening  Musculoskeletal Musculoskelatal Symptoms Reviewed: No symptoms reported Musculoskeletal Management Strategies: Routine screening      Psychosocial Psychosocial Symptoms Reported: Report of significant loss, deaths, abandonment, traumatic incidents Additional Psychological Details: Pt reports a recent los of her sister.RNCM offered emotional support and verified she has a good support system available during this time. No counseling needed at this time from LCSW with [facility] however aware this is always available. Behavioral Management Strategies: Support system        There were no vitals filed for this visit. Pain Scale: 0-10 Pain Score: 7  Pain Type: Chronic pain Pain Location: Leg Pain Orientation: Right Pain Onset: On-going Patients Stated Pain Goal: 0 Pain Intervention(s): Medication (See eMAR), Repositioned, Massage  Medications Reviewed Today     Reviewed by Alvia Olam BIRCH, RN (Registered Nurse) on 07/28/24 at [id number]  Med List Status: <None>   Medication Order Taking? Sig Documenting Provider Last Dose Status Informant  acetaminophen  (TYLENOL ) 325 MG tablet [id number]  Take by mouth every 6 (six) hours as needed.  Patient not taking: Reported on 07/22/2024   [provider]  Active   albuterol  (VENTOLIN  HFA) 108 (90 Base) MCG/ACT inhaler [id number]  Inhale 2 puffs into the lungs every 6 (six) hours as needed  for wheezing or shortness of breath.  Patient not taking: Reported on 07/22/2024    Cloretta Arley NOVAK, MD  Active   amLODipine  (NORVASC ) 5 MG tablet [id number]  Take 1 tablet (5 mg total) by mouth daily.  Patient not taking: Reported on 07/22/2024   Heddy Barren, DO  Active   apixaban  (ELIQUIS ) 5 MG TABS tablet [id number]  Take 1 tablet (5 mg total) by mouth 2 (two) times daily.  Patient not taking: Reported on 07/22/2024   Heddy Barren, DO  Active            Med Note MERNA, SUSAN L   Fri Feb 21, 2024  9:00 AM) On Hold x 2 weeks  dexamethasone  (DECADRON ) 4 MG tablet [id number] Yes Take 1 tablet (4 mg total) by mouth 2 (two) times daily. Take one tablet twice daily x 3 days after chemo. Start on 06/25/2024 Debby Olam POUR, NP  Active   EPINEPHrine  0.3 mg/0.3 mL IJ SOAJ injection [id number]  Inject 0.3 mg into the muscle as needed for anaphylaxis.  Patient not taking: Reported on 07/22/2024   Dreama Longs, MD  Active   lidocaine -prilocaine  (EMLA ) cream [id number] Yes Apply 1 Application topically as needed. Cloretta Arley NOVAK, MD  Active   lipase/protease/amylase (CREON ) 36000 UNITS CPEP capsule [id number]  Take 2 capsules (72,000 Units total) by mouth 3 (three) times daily with meals.  Patient not taking: Reported on 07/22/2024   Debby Olam POUR, NP  Active   loperamide (IMODIUM) 2 MG capsule [id number] Yes Take 2-4 mg by mouth as needed for diarrhea or loose stools. Maximum 8/day [provider]  Active   Melatonin 5 MG CAPS [id number]  Take 1 capsule (5 mg total) by mouth at bedtime.  Patient not taking: Reported on 07/22/2024   Homer Shams, MD  Active   mirtazapine  (REMERON ) 7.5 MG tablet [id number] Yes Take 1 tablet (7.5 mg total) by mouth at bedtime. Carrion-Carrero, Shams, MD  Active   nicotine  (NICODERM CQ  - DOSED IN MG/24 HOURS) 14 mg/24hr patch [id number]  Place 1 patch (14 mg total) onto the skin daily.  Patient not taking: Reported on 07/22/2024   Homer Shams, MD  Active   nitroGLYCERIN  (NITROSTAT ) 0.4 MG SL tablet [id number]  Place 1  tablet (0.4 mg total) under the tongue every 5 (five) minutes as needed for chest pain.  Patient not taking: Reported on 07/22/2024   Atway, Rayann N, DO  Active Self  ondansetron  (ZOFRAN ) 8 MG tablet [id number]  Take 1 tablet (8 mg total) by mouth every 8 (eight) hours as needed.  Patient not taking: Reported on 07/22/2024   Cloretta Arley NOVAK, MD  Active   oxyCODONE  (OXY IR/ROXICODONE ) 5 MG immediate release tablet [id number] Yes Take 5 mg by mouth every 6 (six) hours as needed for severe pain (pain score 7-10) (Reported by patient). [provider]  Active   pantoprazole  (PROTONIX ) 40 MG tablet [id number]  Take 1 tablet (40 mg total) by mouth daily.  Patient not taking: Reported on 07/22/2024   Cloretta Arley NOVAK, MD  Active   potassium chloride  SA (KLOR-CON  M) 20 MEQ tablet [id number] Yes Take 1 tablet (20 mEq total) by mouth daily. Debby Olam POUR, NP  Active   prochlorperazine  (COMPAZINE ) 10 MG tablet [id number] Yes Take 1 tablet (10 mg total) by mouth every 6 (six) hours as needed. Cloretta Arley NOVAK, MD  Active   RESTASIS 0.05 % ophthalmic emulsion [id number] Yes Place 1 drop into both eyes 2 (two)  times daily. [provider]  Active            Med Note (   Wed Jul 08, 2024 10:34 AM) Uses prn            Recommendation:   PCP Follow-up Continue Current Plan of Care  Follow Up Plan:   Telephone follow up appointment date/time:  08/26/2024 @ 10:30 am   Olam Ku, RN, BSN [employer]  [facility], [facility] RN Care Manager Direct Dial: [phone]  Fax: [phone]

## 2024-08-05 ENCOUNTER — Inpatient Hospital Stay

## 2024-08-05 ENCOUNTER — Inpatient Hospital Stay (HOSPITAL_BASED_OUTPATIENT_CLINIC_OR_DEPARTMENT_OTHER): Admitting: Nurse Practitioner

## 2024-08-05 ENCOUNTER — Encounter: Payer: Self-pay | Admitting: Nurse Practitioner

## 2024-08-05 VITALS — BP 146/80 | HR 83 | Temp 97.9°F | Resp 18 | Ht 66.0 in | Wt 177.7 lb

## 2024-08-05 VITALS — BP 168/73 | HR 70 | Temp 98.6°F | Resp 17

## 2024-08-05 DIAGNOSIS — C251 Malignant neoplasm of body of pancreas: Secondary | ICD-10-CM | POA: Diagnosis not present

## 2024-08-05 DIAGNOSIS — Z5111 Encounter for antineoplastic chemotherapy: Secondary | ICD-10-CM | POA: Diagnosis not present

## 2024-08-05 LAB — CBC WITH DIFFERENTIAL (CANCER CENTER ONLY)
Abs Immature Granulocytes: 0.02 K/uL (ref 0.00–0.07)
Basophils Absolute: 0 K/uL (ref 0.0–0.1)
Basophils Relative: 0 %
Eosinophils Absolute: 0.1 K/uL (ref 0.0–0.5)
Eosinophils Relative: 1 %
HCT: 33.4 % — ABNORMAL LOW (ref 36.0–46.0)
Hemoglobin: 10.9 g/dL — ABNORMAL LOW (ref 12.0–15.0)
Immature Granulocytes: 0 %
Lymphocytes Relative: 21 %
Lymphs Abs: 1.3 K/uL (ref 0.7–4.0)
MCH: 30.6 pg (ref 26.0–34.0)
MCHC: 32.6 g/dL (ref 30.0–36.0)
MCV: 93.8 fL (ref 80.0–100.0)
Monocytes Absolute: 0.6 K/uL (ref 0.1–1.0)
Monocytes Relative: 10 %
Neutro Abs: 4.1 K/uL (ref 1.7–7.7)
Neutrophils Relative %: 68 %
Platelet Count: 179 K/uL (ref 150–400)
RBC: 3.56 MIL/uL — ABNORMAL LOW (ref 3.87–5.11)
RDW: 16.4 % — ABNORMAL HIGH (ref 11.5–15.5)
WBC Count: 6 K/uL (ref 4.0–10.5)
nRBC: 0 % (ref 0.0–0.2)

## 2024-08-05 LAB — CMP (CANCER CENTER ONLY)
ALT: 112 U/L — ABNORMAL HIGH (ref 0–44)
AST: 118 U/L — ABNORMAL HIGH (ref 15–41)
Albumin: 3.6 g/dL (ref 3.5–5.0)
Alkaline Phosphatase: 191 U/L — ABNORMAL HIGH (ref 38–126)
Anion gap: 11 (ref 5–15)
BUN: 7 mg/dL (ref 6–20)
CO2: 28 mmol/L (ref 22–32)
Calcium: 9.9 mg/dL (ref 8.9–10.3)
Chloride: 105 mmol/L (ref 98–111)
Creatinine: 0.72 mg/dL (ref 0.44–1.00)
GFR, Estimated: >60 mL/min (ref >60)
Glucose, Bld: 136 mg/dL — ABNORMAL HIGH (ref 70–99)
Potassium: 3 mmol/L — ABNORMAL LOW (ref 3.5–5.1)
Sodium: 144 mmol/L (ref 135–145)
Total Bilirubin: 0.7 mg/dL (ref 0.0–1.2)
Total Protein: 6.8 g/dL (ref 6.5–8.1)

## 2024-08-05 LAB — MAGNESIUM: Magnesium: 2 mg/dL (ref 1.7–2.4)

## 2024-08-05 MED ORDER — SODIUM CHLORIDE 0.9 % IV SOLN
800.0000 mg/m2 | Freq: Once | INTRAVENOUS | Status: AC
  Administered 2024-08-05: 1520 mg via INTRAVENOUS
  Filled 2024-08-05: qty 26.28

## 2024-08-05 MED ORDER — PALONOSETRON HCL INJECTION 0.25 MG/5ML
0.2500 mg | Freq: Once | INTRAVENOUS | Status: AC
  Administered 2024-08-05: 0.25 mg via INTRAVENOUS
  Filled 2024-08-05: qty 5

## 2024-08-05 MED ORDER — PACLITAXEL PROTEIN-BOUND CHEMO INJECTION 100 MG
100.0000 mg/m2 | Freq: Once | INTRAVENOUS | Status: AC
  Administered 2024-08-05: 200 mg via INTRAVENOUS
  Filled 2024-08-05: qty 40

## 2024-08-05 MED ORDER — DEXAMETHASONE SOD PHOSPHATE PF 10 MG/ML IJ SOLN
10.0000 mg | Freq: Once | INTRAMUSCULAR | Status: AC
  Administered 2024-08-05: 10 mg via INTRAVENOUS

## 2024-08-05 MED ORDER — SODIUM CHLORIDE 0.9 % IV SOLN: Freq: Once | INTRAVENOUS | Status: AC

## 2024-08-05 NOTE — Progress Notes (Signed)
  [facility]cer Center OFFICE PROGRESS NOTE   Diagnosis: Pancreas cancer  INTERVAL HISTORY:   Kathleen Stone returns as scheduled.  She completed cycle 11 gemcitabine /Abraxane  07/22/2024.  She denies nausea/vomiting.  No mouth sores.  No diarrhea.  No fever or rash after treatment.  No fever in general.  No shaking chills.  She has a good appetite most of the time.  She notes a decrease in appetite around the time of chemotherapy.  She has intermittent numbness/tingling in the hands and feet, unchanged.  She is not taking oral potassium consistently.  Objective:  Vital signs in last 24 hours:  Blood pressure (!) 146/80, pulse 83, temperature 97.9 F (36.6 C), temperature source Temporal, resp. rate 18, height 5' 6 (1.676 m), weight 177 lb 11.2 oz (80.6 kg), last menstrual period 02/07/2016, SpO2 100%.    HEENT: No thrush or ulcers. Resp: Lungs clear bilaterally. Cardio: Regular rate and rhythm. GI: No hepatosplenomegaly. Vascular: No leg edema. Neuro: Alert and oriented. Skin: No rash. Port-A-Cath without erythema.  Lab Results:  Lab Results  Component Value Date   WBC 6.0 08/05/2024   HGB 10.9 (L) 08/05/2024   HCT 33.4 (L) 08/05/2024   MCV 93.8 08/05/2024   PLT 179 08/05/2024   NEUTROABS 4.1 08/05/2024    Imaging:  No results found.  Medications: I have reviewed the patient's current medications.  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 CT abdomen/pelvis 07/30/2022-body/neck pancreas mass with peripheral pancreatic atrophy and duct dilation, suspected encasement of the celiac axis, uterine fibroids MRI abdomen 07/30/2022-3.7 x 4.7 cm pancreas body/tail mass with encasement of the proximal celiac artery, occlusion with collateralization at the confluence of the SMV and portal vein, no evidence of metastatic disease CT chest 08/01/2022-no intrathoracic metastases Elevated CA 19-9 EUS 08/02/2022-gastric polyp, gastric erosions, 32 x 33 mm pancreas body mass, invasion  of the celiac trunk, invasion of the splenoportal confluence, no SMA invasion, 2 malignant appearing nodes in the porta hepatis,T4N1 by endoscopic criteria, FNA biopsy-adenocarcinoma 08/17/2022 [facility]-tumor mutation burden 0.96; MSI high not detected; 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  Stable porta hepatis lymph nodes. Cycle 7 gemcitabine /Abraxane  11/09/2022 Cycle 8 gemcitabine /Abraxane  11/23/2022 Cycle 9 gemcitabine /Abraxane  12/07/2022 Cycle 10 gemcitabine /Abraxane  12/21/2022 Cycle 11 gemcitabine /Abraxane  01/04/2023 Cycle 12 gemcitabine /Abraxane  01/18/2023 Cycle 13 gemcitabine /Abraxane  02/01/2023 Cycle 14 gemcitabine /Abraxane  02/15/2023 CT abdomen/pelvis 02/26/2023-stable appearance of pancreas body mass with vascular encasement and chronic occlusion of the splenic vein, no evidence of metastatic disease Cycle 15 gemcitabine /Abraxane  03/01/2023 Cycle 16 gemcitabine /Abraxane  03/15/2023 Cycle 17 Gemcitabine /Abraxane  03/29/2023 EUS pancreas fiducial marker placement 04/17/2023 Pancreas SBRT 05/07/2023 - 05/17/2023, 33 Gray in 5 fractions CT abdomen/pelvis 09/14/2023: Slight decrease in pancreas body mass with persistent vascular encasement, no evidence of metastatic disease CTs [facility] 01/02/2024-ill-defined hypoattenuating mass in the mid pancreatic body surrounding surgical clips.  Pancreatic duct within the distal pancreatic body and tail is dilated.  Mass encases the celiac trunk.  Common bile duct is dilated with suggestion of focal stenosis in the mid bile duct.  High-grade  stenosis/occlusion of the portal vein.  Prominent vessels in the upper abdomen.  Heterogeneous appearance of the splenic parenchyma with suggestion of an infiltrating hypoattenuating mass/masses versus sequela of splenic infarcts.  Multiple pulmonary nodules in the  lung bases concerning for metastatic disease. CTs chest/abdomen/pelvis 01/06/2024-no PE.  Cardiomegaly with small pericardial effusion.  Small irregular focus of density medial right lung base.  Poorly defined pancreatic body mass measuring 3 x 2.2 cm, previously 3 x 2.5 cm, possible extension of infiltrative soft tissue towards the pancreatic neck region with ill-defined density at the porta hepatis compared to prior.  Slight increased extrahepatic biliary and common duct dilatation compared to prior.  No focal splenic abnormality. Cycle 1 gemcitabine /abraxane  02/07/2024 Cycle 2 gemcitabine /Abraxane  03/18/2024 Cycle 3 gemcitabine /Abraxane  04/01/2024, premed changed from Compazine  to Zofran  Cycle 4 gemcitabine /Abraxane  04/14/2024, premedication changed to Aloxi /Decadron  Cycle 5 gemcitabine /Abraxane  04/29/2024, premedication continued Aloxi /Decadron  Cycle 6 gemcitabine /Abraxane  05/14/2024, premedication continued Aloxi /Decadron  CTs 05/25/2024-stable right middle lobe and right lower lobe pulmonary lesions. No new pulmonary lesions. Decrease in size of pancreatic head/body junction lesion and tumor extending down along the celiac axis. No findings for hepatic metastatic disease.  Cycle 7 gemcitabine /Abraxane  05/27/2024, Gemcitabine  dose reduced due to thrombocytopenia Cycle 8 gemcitabine /Abraxane  06/10/2024 Cycle 9 gemcitabine /Abraxane  06/24/2024 Cycle 10 gemcitabine /Abraxane  07/08/2024 Cycle 11 gemcitabine /Abraxane  07/22/2024 Cycle 12 gemcitabine /Abraxane  08/05/2024     2.  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  Similar size of of enhancing mass in the dorsal right midbrain effacing the cerebral aqueduct.  No new enhancing lesions identified.  She was transferred to [facility].  She underwent an endoscopic third ventriculostomy on 02/12/2024.   Brain CT 03/13/2024-interval resolution of obstructive hydrocephalus and transependymal edema.  Mild interval decrease in size of a mass within the right midbrain, compatible with glioma.  Chronic encephalomalacia changes in the right frontal lobe. 02/12/2024: Endoscopic ventriculostomy at [facility]   3.  Anorexia/weight loss secondary to #1 4.  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  Abdominal pain 6.  History of colon polyps 7.  Family history of multiple cancers Pancreas cancer maternal aunt, maternal cousin, maternal grandfather Breast cancer-maternal aunt, maternal cousin Prostate cancer-maternal uncle Lung cancer-father 8.  Hypertension 9.  Postmenopausal vaginal bleeding 10.  H. pylori 08/02/2022-amoxicillin  and Flagyl  discontinued 08/21/2022 due to GI symptoms 11.  Syncope event 03/07/2023 12.  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  Anaphylaxis following CT contrast 05/25/2024    Disposition: Ms. Deblasi appears stable.  She has completed 11 cycles of gemcitabine /Abraxane .  She is tolerating treatment well.  She continues to have a good performance status.  New mild elevation of AST/ALT/alkaline phosphatase.  Bilirubin is normal.  I reviewed today's labs with Dr. Cloretta and the [facility] pharmacist.  Plan to proceed with treatment today as scheduled.  CBC and chemistry panel reviewed.  Labs adequate for treatment.  Etiology of elevated liver enzymes unclear.  Continue to monitor.  She has persistent hypokalemia.  She is not taking oral potassium consistently.  We reviewed the instructions.  We will check a magnesium  level today.  She will return for follow-up and treatment in 2 weeks.  We are  available to see her sooner if needed.  Plan reviewed with Dr. Cloretta.    Olam Ned ANP/GNP-BC   08/05/2024  10:10 AM

## 2024-08-05 NOTE — Patient Instructions (Signed)

## 2024-08-05 NOTE — Patient Instructions (Signed)
 [facility] - A DEPT OF [facility]  Discharge Instructions: Thank you for choosing [facility] to provide your oncology and hematology care.   If you have a lab appointment with the Cancer Center, please go directly to the Cancer Center and check in at the registration area.   Wear comfortable clothing and clothing appropriate for easy access to any Portacath or PICC line.   We strive to give you quality time with your provider. You may need to reschedule your appointment if you arrive late (15 or more minutes).  Arriving late affects you and other patients whose appointments are after yours.  Also, if you miss three or more appointments without notifying the office, you may be dismissed from the clinic at the provider's discretion.      For prescription refill requests, have your pharmacy contact our office and allow 72 hours for refills to be completed.    Today you received the following chemotherapy and/or immunotherapy agents: abraxane , gemzar       To help prevent nausea and vomiting after your treatment, we encourage you to take your nausea medication as directed.  BELOW ARE SYMPTOMS THAT SHOULD BE REPORTED IMMEDIATELY: *FEVER GREATER THAN 100.4 F (38 C) OR HIGHER *CHILLS OR SWEATING *NAUSEA AND VOMITING THAT IS NOT CONTROLLED WITH YOUR NAUSEA MEDICATION *UNUSUAL SHORTNESS OF BREATH *UNUSUAL BRUISING OR BLEEDING *URINARY PROBLEMS (pain or burning when urinating, or frequent urination) *BOWEL PROBLEMS (unusual diarrhea, constipation, pain near the anus) TENDERNESS IN MOUTH AND THROAT WITH OR WITHOUT PRESENCE OF ULCERS (sore throat, sores in mouth, or a toothache) UNUSUAL RASH, SWELLING OR PAIN  UNUSUAL VAGINAL DISCHARGE OR ITCHING   Items with * indicate a potential emergency and should be followed up as soon as possible or go to the Emergency Department if any problems should occur.  Please show the CHEMOTHERAPY ALERT CARD or  IMMUNOTHERAPY ALERT CARD at check-in to the Emergency Department and triage nurse.  Should you have questions after your visit or need to cancel or reschedule your appointment, please contact [facility] - A DEPT OF [facility]  Dept: [phone]  and follow the prompts.  Office hours are 8:00 a.m. to 4:30 p.m. Monday - Friday. Please note that voicemails left after 4:00 p.m. may not be returned until the following business day.  We are closed weekends and major holidays. You have access to a nurse at all times for urgent questions. Please call the main number to the clinic Dept: [phone] and follow the prompts.   For any non-urgent questions, you may also contact your provider using MyChart. We now offer e-Visits for anyone 72 and older to request care online for non-urgent symptoms. For details visit [email or web address].   Also download the MyChart app! Go to the app store, search MyChart, open the app, select [facility], and log in with your MyChart username and password.

## 2024-08-05 NOTE — Progress Notes (Signed)
 Patient seen by Olam Ned NP today  Vitals are within treatment parameters:Yes   Labs are within treatment parameters: Yes K 3.0, AST 118 ALT 112 its okay to proceed  Treatment plan has been signed: Yes   Per physician team, Patient is ready for treatment and there are NO modifications to the treatment plan.

## 2024-08-06 LAB — CANCER ANTIGEN 19-9: CA 19-9: 381 U/mL — ABNORMAL HIGH (ref 0–35)

## 2024-08-15 ENCOUNTER — Other Ambulatory Visit: Payer: Self-pay | Admitting: Oncology

## 2024-08-18 NOTE — Progress Notes (Unsigned)
 [facility]   Telephone:(336) [phone] [phone]) [phone]    Patient Care Team: Harrie Bruckner, DO as PCP - General (Internal Medicine) Alvia Olam BIRCH, RN as VBCI Care Management   CHIEF COMPLAINT: Follow up pancreatic cancer   CURRENT THERAPY: Gemcitabine  and Abraxane   INTERVAL HISTORY Kathleen Stone returns for follow up as scheduled. Last seen 08/05/24.  She had worse leg cramps for 5 days, and more headaches.  She has called neurology for follow-up.  Denies headache currently.  She is eating and drinking sodas which is baseline.  Takes potassium when she wants to, took maybe 4 tabs last week.  Denies other medication changes.  She feels cold, no fever or chills.  Tingling in the extremities is stable.  Bowels moving, no nausea/vomiting or new pain.  ROS  All other systems reviewed and negative  Past Medical History:  Diagnosis Date   Abnormal stress test 01/16/2017   Brain tumor (benign) (HCC)    DJD (degenerative joint disease) of cervical spine    Galactorrhea of both breasts 2009   R. sided w/ benign papilloma excised in 4/09. L sided in 5/09   Hematuria, microscopic 02/24/2020   Hyperlipidemia    Hypertension    Hypokalemia    LOW BACK PAIN SYNDROME 07/10/2007   Qualifier: Diagnosis of   By: Lanis MD, Christopher         Nausea and vomiting 08/01/2022   Pancreatic cancer [facility])    Personal history of adenomatous colonic polyp    09/2007 - diminutive adenoma [facility])   Stroke [facility])    2021- brain tumor bx caused ? stroke-    Tobacco abuse    Unintentional weight loss 05/26/2022     Past Surgical History:  Procedure Laterality Date   APPENDECTOMY     BIOPSY  08/02/2022   Procedure: BIOPSY;  Surgeon: Wilhelmenia Aloha Raddle., MD;  Location: [facility];  Service: Gastroenterology;;   BIOPSY  04/17/2023   Procedure: BIOPSY;  Surgeon: Wilhelmenia Aloha Raddle., MD;  Location: [facility];  Service: Gastroenterology;;   BRAIN BIOPSY     [id number]    BREAST SURGERY Right 12/2007   central duct excision   CHOLECYSTECTOMY N/A 12/08/2019   Procedure: LAPAROSCOPIC CHOLECYSTECTOMY;  Surgeon: Rubin Calamity, MD;  Location: [facility] OR;  Service: General;  Laterality: N/A;   COLONOSCOPY  2009   COLONOSCOPY  09/14/2020   COLONOSCOPY W/ BIOPSIES AND POLYPECTOMY  09/2007   Dr. Avram. 2 small adenomatous polyps, internal hemorrhoids-Grade 1. Rec  routine colonoscopy at age 43.   COLONOSCOPY W/ BIOPSIES AND POLYPECTOMY     ESOPHAGOGASTRODUODENOSCOPY (EGD) WITH PROPOFOL  N/A 08/02/2022   Procedure: ESOPHAGOGASTRODUODENOSCOPY (EGD) WITH PROPOFOL ;  Surgeon: Wilhelmenia Aloha Raddle., MD;  Location: [facility];  Service: Gastroenterology;  Laterality: N/A;   ESOPHAGOGASTRODUODENOSCOPY (EGD) WITH PROPOFOL  N/A 04/17/2023   Procedure: ESOPHAGOGASTRODUODENOSCOPY (EGD) WITH PROPOFOL ;  Surgeon: Wilhelmenia Aloha Raddle., MD;  Location: [facility];  Service: Gastroenterology;  Laterality: N/A;   EUS N/A 08/02/2022   Procedure: FULL UPPER ENDOSCOPIC ULTRASOUND (EUS) RADIAL;  Surgeon: Wilhelmenia Aloha Raddle., MD;  Location: [facility];  Service: Gastroenterology;  Laterality: N/A;   EUS N/A 04/17/2023   Procedure: UPPER ENDOSCOPIC ULTRASOUND (EUS) RADIAL;  Surgeon: Wilhelmenia Aloha Raddle., MD;  Location: [facility];  Service: Gastroenterology;  Laterality: N/A;   FIDUCIAL MARKER PLACEMENT N/A 04/17/2023   Procedure: FIDUCIAL MARKER PLACEMENT;  Surgeon: Wilhelmenia Aloha Raddle., MD;  Location: [facility];  Service: Gastroenterology;  Laterality: N/A;  FINE NEEDLE ASPIRATION N/A 08/02/2022   Procedure: FINE NEEDLE ASPIRATION (FNA) LINEAR;  Surgeon: Wilhelmenia Aloha Raddle., MD;  Location: [facility];  Service: Gastroenterology;  Laterality: N/A;   IR IMAGING GUIDED PORT INSERTION  08/03/2022   NASAL SEPTOPLASTY W/ TURBINOPLASTY Bilateral 04/2008   Septoplasty with bilateral inferior turbinate reduction; Dr. Ethyl   POLYPECTOMY  08/02/2022   Procedure: POLYPECTOMY;   Surgeon: Wilhelmenia Aloha Raddle., MD;  Location: [facility];  Service: Gastroenterology;;   SHOULDER OPEN ROTATOR CUFF REPAIR Right     after hurting it by moving heavy objecs     Outpatient Encounter Medications as of 08/19/2024  Medication Sig Note   dexamethasone  (DECADRON ) 4 MG tablet Take 1 tablet (4 mg total) by mouth 2 (two) times daily. Take one tablet twice daily x 3 days after chemo. Start on 06/25/2024    lidocaine -prilocaine  (EMLA ) cream Apply 1 Application topically as needed.    loperamide (IMODIUM) 2 MG capsule Take 2-4 mg by mouth as needed for diarrhea or loose stools. Maximum 8/day    Melatonin 5 MG CAPS Take 1 capsule (5 mg total) by mouth at bedtime.    mirtazapine  (REMERON ) 7.5 MG tablet Take 1 tablet (7.5 mg total) by mouth at bedtime.    oxyCODONE  (OXY IR/ROXICODONE ) 5 MG immediate release tablet Take 5 mg by mouth every 6 (six) hours as needed for severe pain (pain score 7-10) (Reported by patient).    potassium chloride  SA (KLOR-CON  M) 20 MEQ tablet Take 1 tablet (20 mEq total) by mouth daily.    prochlorperazine  (COMPAZINE ) 10 MG tablet Take 1 tablet (10 mg total) by mouth every 6 (six) hours as needed.    RESTASIS 0.05 % ophthalmic emulsion Place 1 drop into both eyes 2 (two) times daily. 07/08/2024: Uses prn   acetaminophen  (TYLENOL ) 325 MG tablet Take by mouth every 6 (six) hours as needed. (Patient not taking: Reported on 08/19/2024)    albuterol  (VENTOLIN  HFA) 108 (90 Base) MCG/ACT inhaler Inhale 2 puffs into the lungs every 6 (six) hours as needed for wheezing or shortness of breath. (Patient not taking: Reported on 08/19/2024)    amLODipine  (NORVASC ) 5 MG tablet Take 1 tablet (5 mg total) by mouth daily. (Patient not taking: Reported on 08/19/2024)    apixaban  (ELIQUIS ) 5 MG TABS tablet Take 1 tablet (5 mg total) by mouth 2 (two) times daily. (Patient not taking: Reported on 08/19/2024) 02/21/2024: On Hold x 2 weeks   EPINEPHrine  0.3 mg/0.3 mL IJ SOAJ injection Inject  0.3 mg into the muscle as needed for anaphylaxis. (Patient not taking: Reported on 08/19/2024)    lipase/protease/amylase (CREON ) 36000 UNITS CPEP capsule Take 2 capsules (72,000 Units total) by mouth 3 (three) times daily with meals. (Patient not taking: Reported on 08/19/2024)    nicotine  (NICODERM CQ  - DOSED IN MG/24 HOURS) 14 mg/24hr patch Place 1 patch (14 mg total) onto the skin daily. (Patient not taking: Reported on 08/19/2024)    nitroGLYCERIN  (NITROSTAT ) 0.4 MG SL tablet Place 1 tablet (0.4 mg total) under the tongue every 5 (five) minutes as needed for chest pain. (Patient not taking: Reported on 08/19/2024)    ondansetron  (ZOFRAN ) 8 MG tablet Take 1 tablet (8 mg total) by mouth every 8 (eight) hours as needed. (Patient not taking: Reported on 08/19/2024)    pantoprazole  (PROTONIX ) 40 MG tablet Take 1 tablet (40 mg total) by mouth daily. (Patient not taking: Reported on 08/19/2024)    No facility-administered encounter medications on file as of  08/19/2024.     Today's Vitals   08/19/24 1000 08/19/24 1004 08/19/24 1005  BP:  (!) 162/72 (!) 155/64  Pulse:  91   Resp:  18   Temp:  97.8 F (36.6 C)   TempSrc:  Temporal   SpO2:  100%   Weight:  174 lb (78.9 kg)   Height:  5' 6 (1.676 m)   PainSc: 0-No pain     Body mass index is 28.08 kg/m.   ECOG PERFORMANCE STATUS: 1 - Symptomatic but completely ambulatory  PHYSICAL EXAM GENERAL:alert, no distress and comfortable SKIN: no rash  HEENT: No thrush or ulcers.  Sclera clear NECK: without mass LUNGS: clear with normal breathing effort HEART: regular rate & rhythm, no lower extremity edema ABDOMEN: abdomen soft, non-tender and normal bowel sounds NEURO: alert & oriented x 3 with fluent speech, no focal motor/sensory deficits PAC without erythema    CBC    Latest Ref Rng & Units 08/19/2024    9:46 AM 08/05/2024    9:24 AM 07/22/2024    9:25 AM  CBC  WBC 4.0 - 10.5 K/uL 6.1  6.0  8.3   Hemoglobin 12.0 - 15.0 g/dL 89.3  89.0   89.3   Hematocrit 36.0 - 46.0 % 31.5  33.4  32.0   Platelets 150 - 400 K/uL 158  179  128       CMP     Latest Ref Rng & Units 08/19/2024    9:46 AM 08/05/2024    9:24 AM 07/22/2024    9:25 AM  CMP  Glucose 70 - 99 mg/dL 831  863  828   BUN 6 - 20 mg/dL 7  7  8    Creatinine 0.44 - 1.00 mg/dL 9.38  9.27  9.25   Sodium 135 - 145 mmol/L 143  144  144   Potassium 3.5 - 5.1 mmol/L 2.9  3.0  3.1   Chloride 98 - 111 mmol/L 106  105  107   CO2 22 - 32 mmol/L 28  28  26    Calcium  8.9 - 10.3 mg/dL 9.6  9.9  9.3   Total Protein 6.5 - 8.1 g/dL 6.6  6.8  6.5   Total Bilirubin 0.0 - 1.2 mg/dL 0.5  0.7  0.5   Alkaline Phos 38 - 126 U/L 230  191  100   AST 15 - 41 U/L 88  118  29   ALT 0 - 44 U/L 74  112  21       ASSESSMENT & PLAN: Pancreas cancer-T4 N1 CT abdomen/pelvis 07/30/2022-body/neck pancreas mass with peripheral pancreatic atrophy and duct dilation, suspected encasement of the celiac axis, uterine fibroids MRI abdomen 07/30/2022-3.7 x 4.7 cm pancreas body/tail mass with encasement of the proximal celiac artery, occlusion with collateralization at the confluence of the SMV and portal vein, no evidence of metastatic disease CT chest 08/01/2022-no intrathoracic metastases Elevated CA 19-9 EUS 08/02/2022-gastric polyp, gastric erosions, 32 x 33 mm pancreas body mass, invasion of the celiac trunk, invasion of the splenoportal confluence, no SMA invasion, 2 malignant appearing nodes in the porta hepatis,T4N1 by endoscopic criteria, FNA biopsy-adenocarcinoma 08/17/2022 [employer]-tumor mutation burden 0.96; MSI high not detected; variant of uncertain clinical significance NTRK3 T93M Cycle 1 gemcitabine /Abraxane  08/17/2022 Cycle 2 gemcitabine /Abraxane  08/31/2022 Cycle 3 gemcitabine /Abraxane  09/14/2022 Cycle 4 gemcitabine /Abraxane  09/28/2022 Cycle 5 gemcitabine /Abraxane  10/12/2022 Cycle 6 gemcitabine /Abraxane  10/26/2022 CT abdomen/pelvis 10/27/2022-stable pancreas body mass with encasement of the  celiac axis and portal vein with occlusion of the splenic vein  and varices.  Stable porta hepatis lymph nodes. Cycle 7 gemcitabine /Abraxane  11/09/2022 Cycle 8 gemcitabine /Abraxane  11/23/2022 Cycle 9 gemcitabine /Abraxane  12/07/2022 Cycle 10 gemcitabine /Abraxane  12/21/2022 Cycle 11 gemcitabine /Abraxane  01/04/2023 Cycle 12 gemcitabine /Abraxane  01/18/2023 Cycle 13 gemcitabine /Abraxane  02/01/2023 Cycle 14 gemcitabine /Abraxane  02/15/2023 CT abdomen/pelvis 02/26/2023-stable appearance of pancreas body mass with vascular encasement and chronic occlusion of the splenic vein, no evidence of metastatic disease Cycle 15 gemcitabine /Abraxane  03/01/2023 Cycle 16 gemcitabine /Abraxane  03/15/2023 Cycle 17 Gemcitabine /Abraxane  03/29/2023 EUS pancreas fiducial marker placement 04/17/2023 Pancreas SBRT 05/07/2023 - 05/17/2023, 33 Gray in 5 fractions CT abdomen/pelvis 09/14/2023: Slight decrease in pancreas body mass with persistent vascular encasement, no evidence of metastatic disease CTs [facility] 01/02/2024-ill-defined hypoattenuating mass in the mid pancreatic body surrounding surgical clips.  Pancreatic duct within the distal pancreatic body and tail is dilated.  Mass encases the celiac trunk.  Common bile duct is dilated with suggestion of focal stenosis in the mid bile duct.  High-grade stenosis/occlusion of the portal vein.  Prominent vessels in the upper abdomen.  Heterogeneous appearance of the splenic parenchyma with suggestion of an infiltrating hypoattenuating mass/masses versus sequela of splenic infarcts.  Multiple pulmonary nodules in the lung bases concerning for metastatic disease. CTs chest/abdomen/pelvis 01/06/2024-no PE.  Cardiomegaly with small pericardial effusion.  Small irregular focus of density medial right lung base.  Poorly defined pancreatic body mass measuring 3 x 2.2 cm, previously 3 x 2.5 cm, possible extension of infiltrative soft tissue towards the pancreatic neck region with  ill-defined density at the porta hepatis compared to prior.  Slight increased extrahepatic biliary and common duct dilatation compared to prior.  No focal splenic abnormality. Cycle 1 gemcitabine /abraxane  02/07/2024 Cycle 2 gemcitabine /Abraxane  03/18/2024 Cycle 3 gemcitabine /Abraxane  04/01/2024, premed changed from Compazine  to Zofran  Cycle 4 gemcitabine /Abraxane  04/14/2024, premedication changed to Aloxi /Decadron  Cycle 5 gemcitabine /Abraxane  04/29/2024, premedication continued Aloxi /Decadron  Cycle 6 gemcitabine /Abraxane  05/14/2024, premedication continued Aloxi /Decadron  CTs 05/25/2024-stable right middle lobe and right lower lobe pulmonary lesions. No new pulmonary lesions. Decrease in size of pancreatic head/body junction lesion and tumor extending down along the celiac axis. No findings for hepatic metastatic disease.  Cycle 7 gemcitabine /Abraxane  05/27/2024, Gemcitabine  dose reduced due to thrombocytopenia Cycle 8 gemcitabine /Abraxane  06/10/2024 Cycle 9 gemcitabine /Abraxane  06/24/2024 Cycle 10 gemcitabine /Abraxane  07/08/2024 Cycle 11 gemcitabine /Abraxane  07/22/2024 Cycle 12 gemcitabine /Abraxane  08/05/2024 Cycle 13 gemcitabine /Abraxane  08/19/2024     2.  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  Similar size of of enhancing mass in the dorsal right midbrain effacing the cerebral aqueduct.  No new enhancing lesions identified.  She was transferred to [facility].  She underwent an endoscopic third ventriculostomy on 02/12/2024.   Brain CT 03/13/2024-interval resolution of obstructive hydrocephalus and transependymal edema.  Mild interval decrease in size of a mass within the right midbrain, compatible with glioma.  Chronic encephalomalacia changes in the right frontal lobe. 02/12/2024: Endoscopic ventriculostomy at [facility]   3.  Anorexia/weight loss secondary to #1 4.   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  Abdominal pain 6.  History of colon polyps 7.  Family history of multiple cancers Pancreas cancer maternal aunt, maternal cousin, maternal grandfather Breast cancer-maternal aunt, maternal cousin Prostate cancer-maternal uncle Lung cancer-father 8.  Hypertension 9.  Postmenopausal vaginal bleeding 10.  H. pylori 08/02/2022-amoxicillin  and Flagyl  discontinued 08/21/2022 due to GI symptoms 11.  Syncope event 03/07/2023 12.  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  Anaphylaxis following CT contrast 05/25/2024  Disposition:  Kathleen Stone appears stable, tolerating Gem/Abraxane  with stable neuropathy. I suspect the leg cramps are secondary to electrolyte abnormalities. I strongly recommend she take oral KCL BID and drink water . We will give 2 K runs today. Otherwise she is able to recover and function well with adequate PS. There is no clinical evidence of disease progression.   Labs reviewed, LFTs remain elevated. She does not take acetaminophen  or drink alcohol. It's possible this is chemo toxicity but will proceed with imaging to r/o early biliary obstruction or disease progression.   Proceed with Gem/Abraxane  today, no dose modifications. She will return for lab, office visit, and treatment in 2 weeks. We are available to see her sooner if needed.   The plan was reviewed with Dr. Cloretta.    Orders Placed This Encounter  Procedures   CT CHEST ABDOMEN PELVIS WO CONTRAST    Standing Status:   Future    Expected Date:   08/26/2024    Expiration Date:   08/19/2025    Is patient pregnant?:   No    Preferred imaging location?:   [facility]    If indicated for the ordered procedure, I authorize the administration of oral contrast media per Radiology protocol:   No    Reason for no oral contrast::   contrast allergy   CBC with Differential (Cancer Center Only)    Standing  Status:   Future    Expected Date:   10/08/2024    Expiration Date:   10/08/2025   CMP (Cancer Center only)    Standing Status:   Future    Expected Date:   10/08/2024    Expiration Date:   10/08/2025      All questions were answered. The patient knows to call the clinic with any problems, questions or concerns. No barriers to learning were detected.   Genene Kilman K Teal Bontrager, NP 08/19/2024

## 2024-08-19 ENCOUNTER — Inpatient Hospital Stay

## 2024-08-19 ENCOUNTER — Inpatient Hospital Stay: Attending: Oncology

## 2024-08-19 ENCOUNTER — Inpatient Hospital Stay: Attending: Oncology | Admitting: Nurse Practitioner

## 2024-08-19 ENCOUNTER — Encounter: Payer: Self-pay | Admitting: Nurse Practitioner

## 2024-08-19 VITALS — BP 167/75 | HR 79 | Temp 98.1°F | Resp 18

## 2024-08-19 VITALS — BP 155/64 | HR 91 | Temp 97.8°F | Resp 18 | Ht 66.0 in | Wt 174.0 lb

## 2024-08-19 DIAGNOSIS — Z452 Encounter for adjustment and management of vascular access device: Secondary | ICD-10-CM | POA: Insufficient documentation

## 2024-08-19 DIAGNOSIS — R200 Anesthesia of skin: Secondary | ICD-10-CM | POA: Diagnosis not present

## 2024-08-19 DIAGNOSIS — Z8042 Family history of malignant neoplasm of prostate: Secondary | ICD-10-CM | POA: Insufficient documentation

## 2024-08-19 DIAGNOSIS — E876 Hypokalemia: Secondary | ICD-10-CM

## 2024-08-19 DIAGNOSIS — Z801 Family history of malignant neoplasm of trachea, bronchus and lung: Secondary | ICD-10-CM | POA: Insufficient documentation

## 2024-08-19 DIAGNOSIS — R978 Other abnormal tumor markers: Secondary | ICD-10-CM | POA: Diagnosis not present

## 2024-08-19 DIAGNOSIS — Z803 Family history of malignant neoplasm of breast: Secondary | ICD-10-CM | POA: Insufficient documentation

## 2024-08-19 DIAGNOSIS — Z800 Family history of malignant neoplasm of digestive organs: Secondary | ICD-10-CM | POA: Diagnosis not present

## 2024-08-19 DIAGNOSIS — C251 Malignant neoplasm of body of pancreas: Secondary | ICD-10-CM

## 2024-08-19 DIAGNOSIS — Z5111 Encounter for antineoplastic chemotherapy: Secondary | ICD-10-CM | POA: Insufficient documentation

## 2024-08-19 DIAGNOSIS — R202 Paresthesia of skin: Secondary | ICD-10-CM | POA: Insufficient documentation

## 2024-08-19 LAB — CMP (CANCER CENTER ONLY)
ALT: 74 U/L — ABNORMAL HIGH (ref 0–44)
AST: 88 U/L — ABNORMAL HIGH (ref 15–41)
Albumin: 3.6 g/dL (ref 3.5–5.0)
Alkaline Phosphatase: 230 U/L — ABNORMAL HIGH (ref 38–126)
Anion gap: 9 (ref 5–15)
BUN: 7 mg/dL (ref 6–20)
CO2: 28 mmol/L (ref 22–32)
Calcium: 9.6 mg/dL (ref 8.9–10.3)
Chloride: 106 mmol/L (ref 98–111)
Creatinine: 0.61 mg/dL (ref 0.44–1.00)
GFR, Estimated: >60 mL/min (ref >60)
Glucose, Bld: 168 mg/dL — ABNORMAL HIGH (ref 70–99)
Potassium: 2.9 mmol/L — ABNORMAL LOW (ref 3.5–5.1)
Sodium: 143 mmol/L (ref 135–145)
Total Bilirubin: 0.5 mg/dL (ref 0.0–1.2)
Total Protein: 6.6 g/dL (ref 6.5–8.1)

## 2024-08-19 LAB — CBC WITH DIFFERENTIAL (CANCER CENTER ONLY)
Abs Immature Granulocytes: 0.02 K/uL (ref 0.00–0.07)
Basophils Absolute: 0 K/uL (ref 0.0–0.1)
Basophils Relative: 0 %
Eosinophils Absolute: 0.1 K/uL (ref 0.0–0.5)
Eosinophils Relative: 1 %
HCT: 31.5 % — ABNORMAL LOW (ref 36.0–46.0)
Hemoglobin: 10.6 g/dL — ABNORMAL LOW (ref 12.0–15.0)
Immature Granulocytes: 0 %
Lymphocytes Relative: 23 %
Lymphs Abs: 1.4 K/uL (ref 0.7–4.0)
MCH: 30.5 pg (ref 26.0–34.0)
MCHC: 33.7 g/dL (ref 30.0–36.0)
MCV: 90.5 fL (ref 80.0–100.0)
Monocytes Absolute: 0.6 K/uL (ref 0.1–1.0)
Monocytes Relative: 10 %
Neutro Abs: 4 K/uL (ref 1.7–7.7)
Neutrophils Relative %: 66 %
Platelet Count: 158 K/uL (ref 150–400)
RBC: 3.48 MIL/uL — ABNORMAL LOW (ref 3.87–5.11)
RDW: 16.3 % — ABNORMAL HIGH (ref 11.5–15.5)
WBC Count: 6.1 K/uL (ref 4.0–10.5)
nRBC: 0 % (ref 0.0–0.2)

## 2024-08-19 MED ORDER — POTASSIUM CHLORIDE 10 MEQ/100ML IV SOLN
10.0000 meq | INTRAVENOUS | Status: AC
  Administered 2024-08-19 (×2): 10 meq via INTRAVENOUS
  Filled 2024-08-19: qty 100

## 2024-08-19 MED ORDER — DEXAMETHASONE SOD PHOSPHATE PF 10 MG/ML IJ SOLN
10.0000 mg | Freq: Once | INTRAMUSCULAR | Status: AC
  Administered 2024-08-19: 10 mg via INTRAVENOUS

## 2024-08-19 MED ORDER — POTASSIUM CHLORIDE CRYS ER 20 MEQ PO TBCR: 20.0000 meq | EXTENDED_RELEASE_TABLET | Freq: Two times a day (BID) | ORAL | 1 refills | Status: DC

## 2024-08-19 MED ORDER — POTASSIUM CHLORIDE 10 MEQ/100ML IV SOLN: 10.0000 meq | INTRAVENOUS | Status: DC

## 2024-08-19 MED ORDER — SODIUM CHLORIDE 0.9 % IV SOLN
800.0000 mg/m2 | Freq: Once | INTRAVENOUS | Status: AC
  Administered 2024-08-19: 1520 mg via INTRAVENOUS
  Filled 2024-08-19: qty 26.28

## 2024-08-19 MED ORDER — SODIUM CHLORIDE 0.9% FLUSH: 10.0000 mL | INTRAVENOUS | Status: DC | PRN

## 2024-08-19 MED ORDER — PALONOSETRON HCL INJECTION 0.25 MG/5ML
0.2500 mg | Freq: Once | INTRAVENOUS | Status: AC
  Administered 2024-08-19: 0.25 mg via INTRAVENOUS
  Filled 2024-08-19: qty 5

## 2024-08-19 MED ORDER — SODIUM CHLORIDE 0.9 % IV SOLN: Freq: Once | INTRAVENOUS | Status: AC

## 2024-08-19 MED ORDER — PACLITAXEL PROTEIN-BOUND CHEMO INJECTION 100 MG
100.0000 mg/m2 | Freq: Once | INTRAVENOUS | Status: AC
  Administered 2024-08-19: 200 mg via INTRAVENOUS
  Filled 2024-08-19: qty 40

## 2024-08-19 NOTE — Patient Instructions (Signed)

## 2024-08-19 NOTE — Addendum Note (Signed)
 Addended by: Donyae Kohn S on: 08/19/2024 11:26 AM   Modules accepted: Orders

## 2024-08-19 NOTE — Patient Instructions (Signed)
 [facility] - A DEPT OF [facility]  Discharge Instructions: Thank you for choosing [facility] to provide your oncology and hematology care.   If you have a lab appointment with the Cancer Center, please go directly to the Cancer Center and check in at the registration area.   Wear comfortable clothing and clothing appropriate for easy access to any Portacath or PICC line.   We strive to give you quality time with your provider. You may need to reschedule your appointment if you arrive late (15 or more minutes).  Arriving late affects you and other patients whose appointments are after yours.  Also, if you miss three or more appointments without notifying the office, you may be dismissed from the clinic at the provider's discretion.      For prescription refill requests, have your pharmacy contact our office and allow 72 hours for refills to be completed.    Today you received the following chemotherapy and/or immunotherapy agents: abraxane , gemzar       To help prevent nausea and vomiting after your treatment, we encourage you to take your nausea medication as directed.  BELOW ARE SYMPTOMS THAT SHOULD BE REPORTED IMMEDIATELY: *FEVER GREATER THAN 100.4 F (38 C) OR HIGHER *CHILLS OR SWEATING *NAUSEA AND VOMITING THAT IS NOT CONTROLLED WITH YOUR NAUSEA MEDICATION *UNUSUAL SHORTNESS OF BREATH *UNUSUAL BRUISING OR BLEEDING *URINARY PROBLEMS (pain or burning when urinating, or frequent urination) *BOWEL PROBLEMS (unusual diarrhea, constipation, pain near the anus) TENDERNESS IN MOUTH AND THROAT WITH OR WITHOUT PRESENCE OF ULCERS (sore throat, sores in mouth, or a toothache) UNUSUAL RASH, SWELLING OR PAIN  UNUSUAL VAGINAL DISCHARGE OR ITCHING   Items with * indicate a potential emergency and should be followed up as soon as possible or go to the Emergency Department if any problems should occur.  Please show the CHEMOTHERAPY ALERT CARD or  IMMUNOTHERAPY ALERT CARD at check-in to the Emergency Department and triage nurse.  Should you have questions after your visit or need to cancel or reschedule your appointment, please contact [facility] - A DEPT OF [facility]  Dept: [phone]  and follow the prompts.  Office hours are 8:00 a.m. to 4:30 p.m. Monday - Friday. Please note that voicemails left after 4:00 p.m. may not be returned until the following business day.  We are closed weekends and major holidays. You have access to a nurse at all times for urgent questions. Please call the main number to the clinic Dept: [phone] and follow the prompts.   For any non-urgent questions, you may also contact your provider using MyChart. We now offer e-Visits for anyone 72 and older to request care online for non-urgent symptoms. For details visit [email or web address].   Also download the MyChart app! Go to the app store, search MyChart, open the app, select [facility], and log in with your MyChart username and password.

## 2024-08-19 NOTE — Progress Notes (Signed)
 Patient seen by Lacie Burton, NP today  Vitals are within treatment parameters:Yes   Labs are within treatment parameters: Yes k+ 2.9 AST 88  Treatment plan has been signed: Yes   Per physician team, Patient is ready for treatment and there are NO modifications to the treatment plan. 2 K runs (total 20 meq)

## 2024-08-19 NOTE — Addendum Note (Signed)
 Addended by: Brynne Doane K on: 08/19/2024 11:28 AM   Modules accepted: Orders

## 2024-08-20 LAB — CANCER ANTIGEN 19-9: CA 19-9: 444 U/mL — ABNORMAL HIGH (ref 0–35)

## 2024-08-24 ENCOUNTER — Other Ambulatory Visit: Payer: Self-pay

## 2024-08-24 ENCOUNTER — Ambulatory Visit

## 2024-08-24 VITALS — BP 141/68 | HR 88 | Temp 98.0°F | Ht 66.0 in | Wt 169.8 lb

## 2024-08-24 DIAGNOSIS — R519 Headache, unspecified: Secondary | ICD-10-CM

## 2024-08-24 DIAGNOSIS — E876 Hypokalemia: Secondary | ICD-10-CM

## 2024-08-24 NOTE — Progress Notes (Unsigned)
 Patient name: Kathleen Stone Date of birth: 04/08/1966 Date of visit: 08/25/24  Type of visit: Acute Office Visit   Subjective   Chief concern:  Chief Complaint  Patient presents with   Follow-up   Annual Exam   Headache    Pt  reports that her headaches are coming back and she is not sure  if it is due to her cancer treatment  or not     RIDHIMA GOLBERG is a 58 y.o. female with a history of pilocytic astrocytoma, pancreatic cancer on chemotherapy, s/p SBRT 2024 and prior right stereotactic brain biopsy and EVD most recently in 01/2024, hypertension, and hyperlipidemia who presents to [facility] clinic for evaluation of headaches.  She was last seen in the [facility] in 02/2024 for HFU, and last seen in 12/2023 in the [facility] regarding hypertension.  At that visit she was started on amlodipine  5 mg daily.  Today she presents to clinic due to 3 weeks of headaches that have been occurring on her chemotherapy days.  She reports that at worst the pain is a 6/10 and once she takes Advil the pain improves to 4/10 before resolving.  She notes that she does have left eye blurriness at baseline, and states that her vision is unchanged from her baseline when the headaches occur.  She states that her last chemotherapy was on 12/3 and she did have a headache at this time.  She also notes that she has been having some muscle spasms as well.  Potassium at chemo has been low and at her last visit she did receive IV potassium runs as her potassium was 2.9 at that time.  She was started on oral KCl 20 mEq twice daily, which she reports that she has been taking in the past week.  She states that in the past when she did have the obstructive hydrocephalus she was not feeling like her normal self, though unclear if these headaches are similar in nature they were around that time.  She does follow with [facility] Neurosurgery Dr. Zettie and Dr. [place].  She last saw Dr. Zettie in 02/2024 and is not scheduled to see her again until  02/2025.  ROS: Negative unless otherwise listed in the HPI.  Patient Active Problem List   Diagnosis Date Noted   Psychosis (HCC) 02/28/2024   Obstructive hydrocephalus (HCC) 02/24/2024   Palpitations 01/08/2024   Portal vein thrombosis 01/08/2024   Port-A-Cath in place 12/06/2023   Paranoia (HCC) 10/01/2023   Abdominal pain 09/11/2023   Prediabetes 09/11/2023   Healthcare maintenance 03/13/2023   Genetic testing 08/27/2022   Pancreas cancer (HCC) 08/03/2022   Protein-calorie malnutrition, severe 08/01/2022   Fatigue 05/02/2022   Hypokalemia 04/07/2022   Post-menopausal bleeding 04/28/2021   Pilocytic astrocytoma (HCC) 02/26/2020   Headache 02/24/2020   Hyperlipidemia 05/21/2019   Tingling of left upper extremity 11/15/2016   Essential hypertension 06/13/2016   Right knee pain 02/16/2016   Lumbar radiculopathy, chronic 01/04/2016   History of colonic polyps 08/17/2012   Preventative health care 07/24/2012   Anxiety and depression 08/23/2011   DEGENERATIVE DISC DISEASE, 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   Disturbance in sleep behavior 07/28/2010   Tobacco abuse 10/07/2006     Past Surgical History:  Procedure Laterality Date   APPENDECTOMY     BIOPSY  08/02/2022   Procedure: BIOPSY;  Surgeon: Wilhelmenia Aloha Raddle., MD;  Location: [facility];  Service: Gastroenterology;;   BIOPSY  04/17/2023   Procedure: BIOPSY;  Surgeon: Wilhelmenia Aloha Raddle.,  MD;  Location: [facility];  Service: Gastroenterology;;   BRAIN BIOPSY     [phone]   BREAST SURGERY Right 12/2007   central duct excision   CHOLECYSTECTOMY N/A 12/08/2019   Procedure: LAPAROSCOPIC CHOLECYSTECTOMY;  Surgeon: Rubin Calamity, MD;  Location: [facility] OR;  Service: General;  Laterality: N/A;   COLONOSCOPY  2009   COLONOSCOPY  09/14/2020   COLONOSCOPY W/ BIOPSIES AND POLYPECTOMY  09/2007   Dr. Avram. 2 small adenomatous polyps, internal hemorrhoids-Grade 1. Rec  routine colonoscopy at age 73.   COLONOSCOPY W/  BIOPSIES AND POLYPECTOMY     ESOPHAGOGASTRODUODENOSCOPY (EGD) WITH PROPOFOL  N/A 08/02/2022   Procedure: ESOPHAGOGASTRODUODENOSCOPY (EGD) WITH PROPOFOL ;  Surgeon: Wilhelmenia Aloha Raddle., MD;  Location: [facility];  Service: Gastroenterology;  Laterality: N/A;   ESOPHAGOGASTRODUODENOSCOPY (EGD) WITH PROPOFOL  N/A 04/17/2023   Procedure: ESOPHAGOGASTRODUODENOSCOPY (EGD) WITH PROPOFOL ;  Surgeon: Wilhelmenia Aloha Raddle., MD;  Location: [facility];  Service: Gastroenterology;  Laterality: N/A;   EUS N/A 08/02/2022   Procedure: FULL UPPER ENDOSCOPIC ULTRASOUND (EUS) RADIAL;  Surgeon: Wilhelmenia Aloha Raddle., MD;  Location: [facility];  Service: Gastroenterology;  Laterality: N/A;   EUS N/A 04/17/2023   Procedure: UPPER ENDOSCOPIC ULTRASOUND (EUS) RADIAL;  Surgeon: Wilhelmenia Aloha Raddle., MD;  Location: [facility];  Service: Gastroenterology;  Laterality: N/A;   FIDUCIAL MARKER PLACEMENT N/A 04/17/2023   Procedure: FIDUCIAL MARKER PLACEMENT;  Surgeon: Wilhelmenia Aloha Raddle., MD;  Location: [facility];  Service: Gastroenterology;  Laterality: N/A;   FINE NEEDLE ASPIRATION N/A 08/02/2022   Procedure: FINE NEEDLE ASPIRATION (FNA) LINEAR;  Surgeon: Wilhelmenia Aloha Raddle., MD;  Location: [facility];  Service: Gastroenterology;  Laterality: N/A;   IR IMAGING GUIDED PORT INSERTION  08/03/2022   NASAL SEPTOPLASTY W/ TURBINOPLASTY Bilateral 04/2008   Septoplasty with bilateral inferior turbinate reduction; Dr. Ethyl   POLYPECTOMY  08/02/2022   Procedure: POLYPECTOMY;  Surgeon: Wilhelmenia Aloha Raddle., MD;  Location: [facility];  Service: Gastroenterology;;   SHOULDER OPEN ROTATOR CUFF REPAIR Right     after hurting it by moving heavy objecs     Current Outpatient Medications  Medication Instructions   acetaminophen  (TYLENOL ) 325 MG tablet Every 6 hours PRN   albuterol  (VENTOLIN  HFA) 108 (90 Base) MCG/ACT inhaler 2 puffs, Inhalation, Every 6 hours PRN   amLODipine  (NORVASC ) 5 mg, Oral, Daily    apixaban  (ELIQUIS ) 5 mg, Oral, 2 times daily   dexamethasone  (DECADRON ) 4 mg, Oral, 2 times daily, Take one tablet twice daily x 3 days after chemo. Start on 06/25/2024   EPINEPHrine  (EPI-PEN) 0.3 mg, Intramuscular, As needed   lidocaine -prilocaine  (EMLA ) cream 1 Application, Topical, As needed   lipase/protease/amylase (CREON ) 72,000 Units, Oral, 3 times daily with meals   loperamide (IMODIUM) 2-4 mg, As needed   Melatonin 5 mg, Oral, Nightly   mirtazapine  (REMERON ) 7.5 mg, Oral, Daily at bedtime   nicotine  (NICODERM CQ  - DOSED IN MG/24 HOURS) 14 mg, Transdermal, Daily   nitroGLYCERIN  (NITROSTAT ) 0.4 mg, Sublingual, Every 5 min PRN   ondansetron  (ZOFRAN ) 8 mg, Oral, Every 8 hours PRN   oxyCODONE  (OXY IR/ROXICODONE ) 5 mg, Every 6 hours PRN   pantoprazole  (PROTONIX ) 40 mg, Oral, Daily   potassium chloride  SA (KLOR-CON  M) 20 MEQ tablet 20 mEq, Oral, 2 times daily   prochlorperazine  (COMPAZINE ) 10 mg, Oral, Every 6 hours PRN   RESTASIS 0.05 % ophthalmic emulsion 1 drop, 2 times daily    Social History   Tobacco Use   Smoking status: Every Day    Current packs/day:  1.50    Average packs/day: 1.5 packs/day for 36.0 years (54.0 ttl pk-yrs)    Types: Cigarettes   Smokeless tobacco: Never  Vaping Use   Vaping status: Former  Substance Use Topics   Alcohol use: No    Alcohol/week: 0.0 standard drinks of alcohol   Drug use: No      Objective  Today's Vitals   08/24/24 1312 08/24/24 1315  BP: (!) 142/63 (!) 141/68  Pulse: 93 88  Temp: 98 F (36.7 C)   TempSrc: Oral   SpO2: 99%   Weight: 169 lb 12.8 oz (77 kg)   Height: 5' 6 (1.676 m)   PainSc: 8    Body mass index is 27.41 kg/m.   Physical Exam:   Constitutional: well-appearing female sitting in exam chair, in no acute distress. Ambulates without use of assistance device HEENT: normocephalic atraumatic, mucous membranes moist Cardiovascular: regular rate and rhythm, bilateral radial pulses 2+ Pulmonary/Chest: normal  work of breathing on room air, lungs clear to auscultation bilaterally Abdominal: soft, non-tender, non-distended MSK: normal bulk and tone. Neurological: alert & oriented x 3 Skin: warm and dry Psych: mood calm, behavior normal, thought content normal, judgement normal      The ASCVD Risk score (Arnett DK, et al., 2019) failed to calculate for the following reasons:   Risk score cannot be calculated because patient has a medical history suggesting prior/existing ASCVD      Assessment & Plan  Problem List Items Addressed This Visit       Other   Headache   Patient has a history of brainstem pilocytic astrocytoma and is s/p biopsy and ventriculostomy.  Third and most recent EVD was performed 01/2024.  She follows with [facility]surgery Dr. Zettie whom she last saw in 02/2024, and is not scheduled to see her back until 02/2025.  Notably, her headaches occur only on her chemotherapy days and are a 6/10 in quality, and improve after taking Advil.  She reports that she has not had any headaches since her last chemotherapy day on 12/3.  She was also reporting some muscle spasms, and states that oncology felt muscle spasms and headaches may be in relation to her low potassium.  She was initiated on oral 20 mEq KCl twice daily, which she reports that she has been adhering to.  I discussed with her that I do feel is very likely that her muscle spasms are a result of her low potassium, though feel unlikely that her headaches are caused by this.  She has not been taking her amlodipine  5 mg daily, and her blood pressure is mildly elevated this morning at 141/68.  I would like for her to resume taking her amlodipine , and continue to check her blood pressure at home with a goal of SBP of 120s.  However I do not think that her blood pressure is causing her headaches at this level of an elevation.  My biggest concern would be any changes in regards to her tumor or obstructive hydrocephalus.  She did recently have  an anaphylactic contrast allergy to iohexol  during a CT scan.  We discussed that MRI contrast agent gadolinium is different and she has also undergone contrasted MRIs in the past, though she would like to avoid any contrast if possible, and I think this is reasonable.  She does carry an EpiPen  with her.  We will obtain MRI brain without contrast given her history of astrocytoma and obstructive hydrocephalus with multiple EVD's.  I have asked for  her to reach out to her neurosurgeon Dr. Zettie to discuss further and if they have any recommendations in the meantime.  I have also asked her to continue to take her potassium as prescribed and to discuss her headaches with oncology as it may be in relation to chemotherapy dosing.  I would also like for her to discuss with them if she is able to take any prophylactic medication on the day of her chemotherapy days since they seem to be in conjunction with chemo. She was given strict return precautions including development of worsening headaches not on chemo days, nausea, vomiting, headaches waking from sleep, and to present to ED if these occur.  - MRI Brain without contrast iso prior contrast allergy  - f/u with Neurosurgery Dr. Zettie - Continue KCl 20 mEq twice daily - Will obtain BMP and Mag level today       Relevant Orders   MR BRAIN WO CONTRAST   Hypokalemia - Primary   Relevant Orders   Basic metabolic panel with GFR (Completed)   Magnesium  (Completed)    Return if symptoms worsen or fail to improve.  Patient discussed with Dr. Lovie, who also saw and evaluated the patient.  Doyal Miyamoto, MD [facility]  PGY-1 08/25/2024, 4:04 PM

## 2024-08-24 NOTE — Patient Instructions (Signed)
 Thank you, Ms.Jagger L Mccreadie for allowing us  to provide your care today. Today we discussed the following:  - I am going to recheck your BMP and Magnesium . If your potassium is still low, we may need to adjust your potassium supplementation - Please call your Neurosurgeon Dr. Zettie to let her know: My doctor ordered a non-contrasted MRI Brain to recheck for hydrocephalus given new headaches, do you recommend this or does it need to be with contrast?    I have ordered the following labs for you:  Lab Orders         Basic metabolic panel with GFR         Magnesium       Tests ordered today:  MRI Brain without Contrast    Follow up: next week with Oncology (cancer doctor)    Remember: Please make sure to call your Neurosurgeon today regarding the headaches. If you develop worsening headaches not on your chemo days, nausea, vomiting, headaches waking you up from sleep, please go to the Emergency Department.   Should you have any questions or concerns please call the Internal Medicine Clinic at [phone].     Doyal Miyamoto, MD [facility]

## 2024-08-25 ENCOUNTER — Encounter: Payer: Self-pay | Admitting: Oncology

## 2024-08-25 ENCOUNTER — Ambulatory Visit: Payer: Self-pay

## 2024-08-25 LAB — BASIC METABOLIC PANEL WITH GFR
BUN/Creatinine Ratio: 17 (ref 9–23)
BUN: 13 mg/dL (ref 6–24)
CO2: 26 mmol/L (ref 20–29)
Calcium: 9.8 mg/dL (ref 8.7–10.2)
Chloride: 100 mmol/L (ref 96–106)
Creatinine, Ser: 0.77 mg/dL (ref 0.57–1.00)
Glucose: 104 mg/dL — ABNORMAL HIGH (ref 70–99)
Potassium: 3.2 mmol/L — ABNORMAL LOW (ref 3.5–5.2)
Sodium: 138 mmol/L (ref 134–144)
eGFR: 89 mL/min/1.73 (ref >59)

## 2024-08-25 LAB — MAGNESIUM: Magnesium: 2.1 mg/dL (ref 1.6–2.3)

## 2024-08-25 NOTE — Assessment & Plan Note (Addendum)
 Patient has a history of brainstem pilocytic astrocytoma and is s/p biopsy and ventriculostomy.  Third and most recent EVD was performed 01/2024.  She follows with [facility] neurosurgery Dr. Zettie whom she last saw in 02/2024, and is not scheduled to see her back until 02/2025.  Notably, her headaches occur only on her chemotherapy days and are a 6/10 in quality, and improve after taking Advil.  She reports that she has not had any headaches since her last chemotherapy day on 12/3.  She was also reporting some muscle spasms, and states that oncology felt muscle spasms and headaches may be in relation to her low potassium.  She was initiated on oral 20 mEq KCl twice daily, which she reports that she has been adhering to.  I discussed with her that I do feel is very likely that her muscle spasms are a result of her low potassium, though feel unlikely that her headaches are caused by this.  She has not been taking her amlodipine  5 mg daily, and her blood pressure is mildly elevated this morning at 141/68.  I would like for her to resume taking her amlodipine , and continue to check her blood pressure at home with a goal of SBP of 120s.  However I do not think that her blood pressure is causing her headaches at this level of an elevation.  My biggest concern would be any changes in regards to her tumor or obstructive hydrocephalus.  She did recently have an anaphylactic contrast allergy to iohexol  during a CT scan.  We discussed that MRI contrast agent gadolinium is different and she has also undergone contrasted MRIs in the past, though she would like to avoid any contrast if possible, and I think this is reasonable.  She does carry an EpiPen  with her.  We will obtain MRI brain without contrast given her history of astrocytoma and obstructive hydrocephalus with multiple EVD's.  I have asked for her to reach out to her neurosurgeon Dr. Zettie to discuss further and if they have any recommendations in the meantime.  I have also  asked her to continue to take her potassium as prescribed and to discuss her headaches with oncology as it may be in relation to chemotherapy dosing.  I would also like for her to discuss with them if she is able to take any prophylactic medication on the day of her chemotherapy days since they seem to be in conjunction with chemo. She was given strict return precautions including development of worsening headaches not on chemo days, nausea, vomiting, headaches waking from sleep, and to present to ED if these occur.  - MRI Brain without contrast iso prior contrast allergy  - f/u with Neurosurgery Dr. Zettie - Continue KCl 20 mEq twice daily - Will obtain BMP and Mag level today

## 2024-08-26 ENCOUNTER — Other Ambulatory Visit: Payer: Self-pay | Admitting: *Deleted

## 2024-08-26 NOTE — Patient Outreach (Signed)
 Complex Care Management   Visit Note  08/26/2024  Name:  Kathleen Stone MRN: [id number] DOB: 02-12-1966  Situation: Referral received for Complex Care Management related to Pancreatic Mass I obtained verbal consent from Patient.  Visit completed with Patient  on the phone  Background:   Past Medical History:  Diagnosis Date   Abnormal stress test 01/16/2017   Brain tumor (benign) (HCC)    DJD (degenerative joint disease) of cervical spine    Galactorrhea of both breasts 2009   R. sided w/ benign papilloma excised in 4/09. L sided in 5/09   Hematuria, microscopic 02/24/2020   Hyperlipidemia    Hypertension    Hypokalemia    LOW BACK PAIN SYNDROME 07/10/2007   Qualifier: Diagnosis of   By: Lanis MD, Christopher         Nausea and vomiting 08/01/2022   Pancreatic cancer [facility])    Personal history of adenomatous colonic polyp    09/2007 - diminutive adenoma [facility])   Stroke [facility])    2021- brain tumor bx caused ? stroke-    Tobacco abuse    Unintentional weight loss 05/26/2022    Assessment: Patient Reported Symptoms:  Cognitive Cognitive Status: Able to follow simple commands, Alert and oriented to person, place, and time, Normal speech and language skills   Health Maintenance Behaviors: Annual physical exam  Neurological Neurological Review of Symptoms: Headaches Neurological Management Strategies: Medication therapy, Routine screening Neurological Comment: Pt pending brain scandue to her ongoing HAs. Pt continue withthe recommended medications.  HEENT HEENT Symptoms Reported: No symptoms reported HEENT Management Strategies: Routine screening    Cardiovascular Cardiovascular Symptoms Reported: No symptoms reported Does patient have uncontrolled Hypertension?: Yes Is patient checking Blood Pressure at home?: Yes Patient's Recent BP reading at home: last bp read 120/60 and pt remains asymatomatic Cardiovascular Management Strategies: Medication therapy, Routine screening,  Coping strategies Weight: 169 lb (76.7 kg)  Respiratory Respiratory Symptoms Reported: No symptoms reported Respiratory Management Strategies: Routine screening  Endocrine Endocrine Symptoms Reported: No symptoms reported Is patient diabetic?: No    Gastrointestinal Gastrointestinal Symptoms Reported: No symptoms reported Gastrointestinal Management Strategies: Medication therapy    Genitourinary Genitourinary Symptoms Reported: No symptoms reported    Integumentary Integumentary Symptoms Reported: No symptoms reported Skin Management Strategies: Routine screening  Musculoskeletal Musculoskelatal Symptoms Reviewed: Muscle pain Additional Musculoskeletal Details: c/o leg cramps with her ongoing chemotherapy. Pt current taking K+ other education discussed for Aleve  X, Magnesium  and muscle relaxants that she can inquire with her provider prior to administration of any OTC medicine to make sure these options do not interfere with her ongoing therapies (pt verbalized an understanding). Musculoskeletal Management Strategies: Routine screening      Psychosocial Psychosocial Symptoms Reported: No symptoms reported Behavioral Management Strategies: Support system Major Change/Loss/Stressor/Fears (CP): Denies Quality of Family Relationships: supportive, involved, helpful Do you feel physically threatened by others?: No     Today's Vitals   08/26/24 1102  Weight: 169 lb (76.7 kg)   Pain Scale: 0-10 Pain Score: 8  Pain Location: Leg Pain Orientation: Right, Left Pain Descriptors / Indicators: Aching Pain Onset: On-going Patients Stated Pain Goal: 0 Pain Intervention(s): Medication (See eMAR), Hot/Cold interventions, Repositioned, Massage  Medications Reviewed Today     Reviewed by Alvia Olam BIRCH, RN (Registered Nurse) on 08/26/24 at 1054  Med List Status: <None>   Medication Order Taking? Sig Documenting Provider Last Dose Status Informant  acetaminophen  (TYLENOL ) 325 MG tablet  [id number]  Take by mouth every 6 (six)  hours as needed.  Patient not taking: Reported on 08/19/2024   [provider]  Active   albuterol  (VENTOLIN  HFA) 108 (90 Base) MCG/ACT inhaler [id number]  Inhale 2 puffs into the lungs every 6 (six) hours as needed for wheezing or shortness of breath.  Patient not taking: Reported on 08/19/2024   Cloretta Arley NOVAK, MD  Active   amLODipine  (NORVASC ) 5 MG tablet [id number]  Take 1 tablet (5 mg total) by mouth daily.  Patient not taking: Reported on 08/19/2024   Heddy Barren, DO  Active   apixaban  (ELIQUIS ) 5 MG TABS tablet [id number]  Take 1 tablet (5 mg total) by mouth 2 (two) times daily.  Patient not taking: Reported on 08/19/2024   Heddy Barren, DO  Active            Med Note MERNA, SUSAN L   Fri Feb 21, 2024  9:00 AM) On Hold x 2 weeks  dexamethasone  (DECADRON ) 4 MG tablet [id number] Yes Take 1 tablet (4 mg total) by mouth 2 (two) times daily. Take one tablet twice daily x 3 days after chemo. Start on 06/25/2024 Debby Olam POUR, NP  Active   EPINEPHrine  0.3 mg/0.3 mL IJ SOAJ injection [id number]  Inject 0.3 mg into the muscle as needed for anaphylaxis.  Patient not taking: Reported on 08/19/2024   Dreama Longs, MD  Active   lidocaine -prilocaine  (EMLA ) cream [id number] Yes Apply 1 Application topically as needed. Cloretta Arley NOVAK, MD  Active   lipase/protease/amylase (CREON ) 36000 UNITS CPEP capsule [id number]  Take 2 capsules (72,000 Units total) by mouth 3 (three) times daily with meals.  Patient not taking: Reported on 08/19/2024   Debby Olam POUR, NP  Active   loperamide (IMODIUM) 2 MG capsule [id number] Yes Take 2-4 mg by mouth as needed for diarrhea or loose stools. Maximum 8/day [provider]  Active   Melatonin 5 MG CAPS [id number] Yes Take 1 capsule (5 mg total) by mouth at bedtime. Carrion-Carrero, Marlo, MD  Active   mirtazapine  (REMERON ) 7.5 MG tablet [id number] Yes Take 1 tablet (7.5 mg total) by mouth at bedtime.  Carrion-Carrero, Marlo, MD  Active   nicotine  (NICODERM CQ  - DOSED IN MG/24 HOURS) 14 mg/24hr patch [id number]  Place 1 patch (14 mg total) onto the skin daily.  Patient not taking: Reported on 08/19/2024   Homer Marlo, MD  Active   nitroGLYCERIN  (NITROSTAT ) 0.4 MG SL tablet [id number]  Place 1 tablet (0.4 mg total) under the tongue every 5 (five) minutes as needed for chest pain.  Patient not taking: Reported on 08/19/2024   Atway, Rayann N, DO  Active Self  ondansetron  (ZOFRAN ) 8 MG tablet [id number]  Take 1 tablet (8 mg total) by mouth every 8 (eight) hours as needed.  Patient not taking: Reported on 08/19/2024   Cloretta Arley NOVAK, MD  Active   oxyCODONE  (OXY IR/ROXICODONE ) 5 MG immediate release tablet [id number] Yes Take 5 mg by mouth every 6 (six) hours as needed for severe pain (pain score 7-10) (Reported by patient). [provider]  Active   pantoprazole  (PROTONIX ) 40 MG tablet [id number]  Take 1 tablet (40 mg total) by mouth daily.  Patient not taking: Reported on 08/19/2024   Cloretta Arley NOVAK, MD  Active   potassium chloride  SA (KLOR-CON  M) 20 MEQ tablet [id number] Yes Take 1 tablet (20 mEq total) by mouth 2 (two) times daily. Burton, Lacie K, NP  Active   prochlorperazine  (COMPAZINE ) 10 MG tablet [id number] Yes Take 1 tablet (  10 mg total) by mouth every 6 (six) hours as needed. Cloretta Arley NOVAK, MD  Active   RESTASIS 0.05 % ophthalmic emulsion [id number] Yes Place 1 drop into both eyes 2 (two) times daily. [provider]  Active            Med Note (   Wed Jul 08, 2024 10:34 AM) Uses prn            Recommendation:   PCP Follow-up Continue Current Plan of Care  Follow Up Plan:   Telephone follow up appointment date/time:  09/21/2024 @ 1:30 pm   Olam Ku, RN, BSN [place]  [facility], [facility] RN Care Manager Direct Dial: [phone]  Fax: [phone]

## 2024-08-26 NOTE — Patient Instructions (Signed)
 Visit Information  Thank you for taking time to visit with me today. Please don't hesitate to contact me if I can be of assistance to you before our next scheduled appointment.  Your next care management appointment is by telephone on 09/21/2024 at 1:30 pm   Please call the care guide team at [phone] if you need to cancel, schedule, or reschedule an appointment.   Please call the Suicide and Crisis Lifeline: 988 call the [place]  National Suicide Prevention Lifeline: 581 244 3379 or TTY: 4325030041 TTY 4780826540) to talk to a trained counselor call 1-800-273-TALK (toll free, 24 hour hotline) if you are experiencing a Mental Health or Behavioral Health Crisis or need someone to talk to.   Olam Ku, RN, BSN [place]  [facility], [facility] RN Care Manager Direct Dial: [phone]  Fax: [phone]

## 2024-08-27 NOTE — Progress Notes (Signed)
 Internal Medicine Clinic Attending  I was physically present during the key portions of the resident provided service and participated in the medical decision making of patient's management care. I reviewed pertinent patient test results.  The assessment, diagnosis, and plan were formulated together and I agree with the documentation in the resident's note.  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 No altered mental status. No current headache. Headaches do not wake her from sleep and only happen on days when she's getting chemotherapy. However, they feel similar in quality to the headaches she had when she was first diagnosed with her astrocytoma. I have very low suspicion for increased intracranial pressure. However, since the quality of her headaches feels similar to past ones, I would like to non-emergently re-image her brain to ensure it is stable from prior scans. I would also like her to call her neurosurgeon, Dr. Zettie, to be evaluated further - she will call him tomorrow.

## 2024-08-29 ENCOUNTER — Other Ambulatory Visit: Payer: Self-pay | Admitting: Oncology

## 2024-09-02 ENCOUNTER — Encounter: Payer: Self-pay | Admitting: Nurse Practitioner

## 2024-09-02 ENCOUNTER — Inpatient Hospital Stay

## 2024-09-02 ENCOUNTER — Inpatient Hospital Stay: Admitting: Nurse Practitioner

## 2024-09-02 VITALS — BP 148/76 | HR 65 | Temp 98.1°F | Resp 16 | Wt 174.3 lb

## 2024-09-02 DIAGNOSIS — C251 Malignant neoplasm of body of pancreas: Secondary | ICD-10-CM | POA: Diagnosis not present

## 2024-09-02 DIAGNOSIS — Z5111 Encounter for antineoplastic chemotherapy: Secondary | ICD-10-CM | POA: Diagnosis not present

## 2024-09-02 LAB — CBC WITH DIFFERENTIAL (CANCER CENTER ONLY)
Abs Immature Granulocytes: 0.01 K/uL (ref 0.00–0.07)
Basophils Absolute: 0 K/uL (ref 0.0–0.1)
Basophils Relative: 1 %
Eosinophils Absolute: 0.1 K/uL (ref 0.0–0.5)
Eosinophils Relative: 1 %
HCT: 33.5 % — ABNORMAL LOW (ref 36.0–46.0)
Hemoglobin: 10.9 g/dL — ABNORMAL LOW (ref 12.0–15.0)
Immature Granulocytes: 0 %
Lymphocytes Relative: 24 %
Lymphs Abs: 1.4 K/uL (ref 0.7–4.0)
MCH: 29.8 pg (ref 26.0–34.0)
MCHC: 32.5 g/dL (ref 30.0–36.0)
MCV: 91.5 fL (ref 80.0–100.0)
Monocytes Absolute: 0.7 K/uL (ref 0.1–1.0)
Monocytes Relative: 12 %
Neutro Abs: 3.5 K/uL (ref 1.7–7.7)
Neutrophils Relative %: 62 %
Platelet Count: 147 K/uL — ABNORMAL LOW (ref 150–400)
RBC: 3.66 MIL/uL — ABNORMAL LOW (ref 3.87–5.11)
RDW: 17.2 % — ABNORMAL HIGH (ref 11.5–15.5)
WBC Count: 5.6 K/uL (ref 4.0–10.5)
nRBC: 0 % (ref 0.0–0.2)

## 2024-09-02 LAB — CMP (CANCER CENTER ONLY)
ALT: 153 U/L — ABNORMAL HIGH (ref 0–44)
AST: 129 U/L — ABNORMAL HIGH (ref 15–41)
Albumin: 3.7 g/dL (ref 3.5–5.0)
Alkaline Phosphatase: 308 U/L — ABNORMAL HIGH (ref 38–126)
Anion gap: 9 (ref 5–15)
BUN: 9 mg/dL (ref 6–20)
CO2: 29 mmol/L (ref 22–32)
Calcium: 9.5 mg/dL (ref 8.9–10.3)
Chloride: 105 mmol/L (ref 98–111)
Creatinine: 0.78 mg/dL (ref 0.44–1.00)
GFR, Estimated: >60 mL/min (ref >60)
Glucose, Bld: 179 mg/dL — ABNORMAL HIGH (ref 70–99)
Potassium: 2.9 mmol/L — ABNORMAL LOW (ref 3.5–5.1)
Sodium: 144 mmol/L (ref 135–145)
Total Bilirubin: 1 mg/dL (ref 0.0–1.2)
Total Protein: 6.7 g/dL (ref 6.5–8.1)

## 2024-09-02 NOTE — Progress Notes (Signed)
 [facility] OFFICE PROGRESS NOTE   Diagnosis: Pancreas cancer  INTERVAL HISTORY:   Kathleen Stone returns as scheduled.  She completed another cycle of gemcitabine /Abraxane  08/19/2024.  She denies nausea/vomiting.  No mouth sores.  No diarrhea.  No fever or rash after treatment.  She continues to have intermittent leg cramps.  She reports she is taking no prescribed oral medications including potassium.  Objective:  Vital signs in last 24 hours:  Blood pressure (!) 148/76, pulse 65, temperature 98.1 F (36.7 C), temperature source Temporal, resp. rate 16, weight 174 lb 4.8 oz (79.1 kg), last menstrual period 02/07/2016, SpO2 100%.    HEENT: No thrush or ulcers. Resp: Lungs clear bilaterally. Cardio: Regular rate and rhythm. GI: No hepatosplenomegaly.  Firm fullness just to the left of the upper aspect of the midline scar, associated tenderness. Vascular: No leg edema. Port-A-Cath without erythema.  Lab Results:  Lab Results  Component Value Date   WBC 5.6 09/02/2024   HGB 10.9 (L) 09/02/2024   HCT 33.5 (L) 09/02/2024   MCV 91.5 09/02/2024   PLT 147 (L) 09/02/2024   NEUTROABS 3.5 09/02/2024    Imaging:  No results found.  Medications: I have reviewed the patient's current medications.  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 CT abdomen/pelvis 07/30/2022-body/neck pancreas mass with peripheral pancreatic atrophy and duct dilation, suspected encasement of the celiac axis, uterine fibroids MRI abdomen 07/30/2022-3.7 x 4.7 cm pancreas body/tail mass with encasement of the proximal celiac artery, occlusion with collateralization at the confluence of the SMV and portal vein, no evidence of metastatic disease CT chest 08/01/2022-no intrathoracic metastases Elevated CA 19-9 EUS 08/02/2022-gastric polyp, gastric erosions, 32 x 33 mm pancreas body mass, invasion of the celiac trunk, invasion of the splenoportal confluence, no SMA invasion, 2 malignant appearing nodes in  the porta hepatis,T4N1 by endoscopic criteria, FNA biopsy-adenocarcinoma 08/17/2022 Guardant360-tumor mutation burden 0.96; MSI high not detected; 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  Stable porta hepatis lymph nodes. Cycle 7 gemcitabine /Abraxane  11/09/2022 Cycle 8 gemcitabine /Abraxane  11/23/2022 Cycle 9 gemcitabine /Abraxane  12/07/2022 Cycle 10 gemcitabine /Abraxane  12/21/2022 Cycle 11 gemcitabine /Abraxane  01/04/2023 Cycle 12 gemcitabine /Abraxane  01/18/2023 Cycle 13 gemcitabine /Abraxane  02/01/2023 Cycle 14 gemcitabine /Abraxane  02/15/2023 CT abdomen/pelvis 02/26/2023-stable appearance of pancreas body mass with vascular encasement and chronic occlusion of the splenic vein, no evidence of metastatic disease Cycle 15 gemcitabine /Abraxane  03/01/2023 Cycle 16 gemcitabine /Abraxane  03/15/2023 Cycle 17 Gemcitabine /Abraxane  03/29/2023 EUS pancreas fiducial marker placement 04/17/2023 Pancreas SBRT 05/07/2023 - 05/17/2023, 33 Gray in 5 fractions CT abdomen/pelvis 09/14/2023: Slight decrease in pancreas body mass with persistent vascular encasement, no evidence of metastatic disease CTs [facility] 01/02/2024-ill-defined hypoattenuating mass in the mid pancreatic body surrounding surgical clips.  Pancreatic duct within the distal pancreatic body and tail is dilated.  Mass encases the celiac trunk.  Common bile duct is dilated with suggestion of focal stenosis in the mid bile duct.  High-grade stenosis/occlusion of the portal vein.  Prominent vessels in the upper abdomen.  Heterogeneous appearance of the  splenic parenchyma with suggestion of an infiltrating hypoattenuating mass/masses versus sequela of splenic infarcts.  Multiple pulmonary nodules in the lung bases concerning for metastatic disease. CTs chest/abdomen/pelvis 01/06/2024-no PE.  Cardiomegaly with small pericardial effusion.  Small irregular focus of density medial  right lung base.  Poorly defined pancreatic body mass measuring 3 x 2.2 cm, previously 3 x 2.5 cm, possible extension of infiltrative soft tissue towards the pancreatic neck region with ill-defined density at the porta hepatis compared to prior.  Slight increased extrahepatic biliary and common duct dilatation compared to prior.  No focal splenic abnormality. Cycle 1 gemcitabine /abraxane  02/07/2024 Cycle 2 gemcitabine /Abraxane  03/18/2024 Cycle 3 gemcitabine /Abraxane  04/01/2024, premed changed from Compazine  to Zofran  Cycle 4 gemcitabine /Abraxane  04/14/2024, premedication changed to Aloxi /Decadron  Cycle 5 gemcitabine /Abraxane  04/29/2024, premedication continued Aloxi /Decadron  Cycle 6 gemcitabine /Abraxane  05/14/2024, premedication continued Aloxi /Decadron  CTs 05/25/2024-stable right middle lobe and right lower lobe pulmonary lesions. No new pulmonary lesions. Decrease in size of pancreatic head/body junction lesion and tumor extending down along the celiac axis. No findings for hepatic metastatic disease.  Cycle 7 gemcitabine /Abraxane  05/27/2024, Gemcitabine  dose reduced due to thrombocytopenia Cycle 8 gemcitabine /Abraxane  06/10/2024 Cycle 9 gemcitabine /Abraxane  06/24/2024 Cycle 10 gemcitabine /Abraxane  07/08/2024 Cycle 11 gemcitabine /Abraxane  07/22/2024 Cycle 12 gemcitabine /Abraxane  08/05/2024 Cycle 13 gemcitabine /Abraxane  08/19/2024 Treatment held 09/02/2024 due to progressive rise of liver enzymes, referred for CTs     2.  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  Similar size of of enhancing mass in the dorsal right midbrain effacing the cerebral aqueduct.  No new enhancing lesions identified.  She was transferred to [facility].  She underwent an endoscopic third ventriculostomy on 02/12/2024.   Brain CT 03/13/2024-interval resolution of obstructive hydrocephalus and transependymal edema.  Mild interval decrease in size of a mass within the right midbrain, compatible with glioma.  Chronic encephalomalacia changes in the right frontal lobe. 02/12/2024: Endoscopic ventriculostomy at [facility]   3.  Anorexia/weight loss secondary to #1 4.  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  Abdominal pain 6.  History of colon polyps 7.  Family history of multiple cancers Pancreas cancer maternal aunt, maternal cousin, maternal grandfather Breast cancer-maternal aunt, maternal cousin Prostate cancer-maternal uncle Lung cancer-father 8.  Hypertension 9.  Postmenopausal vaginal bleeding 10.  H. pylori 08/02/2022-amoxicillin  and Flagyl  discontinued 08/21/2022 due to GI symptoms 11.  Syncope event 03/07/2023 12.  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  Anaphylaxis following CT contrast 05/25/2024    Disposition: Ms. Welford appears stable.  She has completed 13 cycles of gemcitabine /Abraxane .  She is tolerating treatment well.  She has a good performance status.  Liver enzymes were higher 2 weeks ago.  She was referred for CT scans which unfortunately have not been scheduled/completed.  Liver enzymes are higher today.  We are holding chemotherapy today with the plan for CT scans in the next 4 to 5 days.  She has persistent hypokalemia.  She declines IV and oral replacement.    She will return for lab and follow-up in 1 week.  We are available to see her sooner if needed.  Plan reviewed with Dr. Cloretta.  Olam Ned ANP/GNP-BC   09/02/2024  11:02 AM

## 2024-09-03 LAB — CANCER ANTIGEN 19-9: CA 19-9: 462 U/mL — ABNORMAL HIGH (ref 0–35)

## 2024-09-06 ENCOUNTER — Ambulatory Visit (HOSPITAL_BASED_OUTPATIENT_CLINIC_OR_DEPARTMENT_OTHER)
Admission: RE | Admit: 2024-09-06 | Discharge: 2024-09-06 | Disposition: A | Discharge status: Home / Self Care | Source: Ambulatory Visit | Attending: Nurse Practitioner | Admitting: Nurse Practitioner

## 2024-09-06 DIAGNOSIS — C251 Malignant neoplasm of body of pancreas: Secondary | ICD-10-CM | POA: Diagnosis present

## 2024-09-09 ENCOUNTER — Telehealth: Payer: Self-pay | Admitting: Internal Medicine

## 2024-09-09 ENCOUNTER — Other Ambulatory Visit: Payer: Self-pay

## 2024-09-09 ENCOUNTER — Inpatient Hospital Stay: Admitting: Nurse Practitioner

## 2024-09-09 ENCOUNTER — Encounter: Payer: Self-pay | Admitting: Nurse Practitioner

## 2024-09-09 ENCOUNTER — Inpatient Hospital Stay

## 2024-09-09 ENCOUNTER — Ambulatory Visit (HOSPITAL_COMMUNITY)
Admission: RE | Admit: 2024-09-09 | Discharge: 2024-09-09 | Disposition: A | Discharge status: Home / Self Care | Source: Ambulatory Visit | Attending: Nurse Practitioner | Admitting: Nurse Practitioner

## 2024-09-09 VITALS — BP 132/74 | HR 91 | Temp 98.0°F | Resp 16 | Wt 168.5 lb

## 2024-09-09 DIAGNOSIS — R17 Unspecified jaundice: Secondary | ICD-10-CM

## 2024-09-09 DIAGNOSIS — C251 Malignant neoplasm of body of pancreas: Secondary | ICD-10-CM | POA: Diagnosis not present

## 2024-09-09 DIAGNOSIS — K831 Obstruction of bile duct: Secondary | ICD-10-CM

## 2024-09-09 DIAGNOSIS — C250 Malignant neoplasm of head of pancreas: Secondary | ICD-10-CM

## 2024-09-09 LAB — CBC WITH DIFFERENTIAL (CANCER CENTER ONLY)
Abs Immature Granulocytes: 0.03 K/uL (ref 0.00–0.07)
Basophils Absolute: 0 K/uL (ref 0.0–0.1)
Basophils Relative: 1 %
Eosinophils Absolute: 0.1 K/uL (ref 0.0–0.5)
Eosinophils Relative: 1 %
HCT: 36.9 % (ref 36.0–46.0)
Hemoglobin: 12.2 g/dL (ref 12.0–15.0)
Immature Granulocytes: 0 %
Lymphocytes Relative: 23 %
Lymphs Abs: 2 K/uL (ref 0.7–4.0)
MCH: 29.9 pg (ref 26.0–34.0)
MCHC: 33.1 g/dL (ref 30.0–36.0)
MCV: 90.4 fL (ref 80.0–100.0)
Monocytes Absolute: 0.9 K/uL (ref 0.1–1.0)
Monocytes Relative: 10 %
Neutro Abs: 5.6 K/uL (ref 1.7–7.7)
Neutrophils Relative %: 65 %
Platelet Count: 329 K/uL (ref 150–400)
RBC: 4.08 MIL/uL (ref 3.87–5.11)
RDW: 18.6 % — ABNORMAL HIGH (ref 11.5–15.5)
WBC Count: 8.6 K/uL (ref 4.0–10.5)
nRBC: 0 % (ref 0.0–0.2)

## 2024-09-09 LAB — CMP (CANCER CENTER ONLY)
ALT: 91 U/L — ABNORMAL HIGH (ref 0–44)
AST: 96 U/L — ABNORMAL HIGH (ref 15–41)
Albumin: 4 g/dL (ref 3.5–5.0)
Alkaline Phosphatase: 415 U/L — ABNORMAL HIGH (ref 38–126)
Anion gap: 11 (ref 5–15)
BUN: 9 mg/dL (ref 6–20)
CO2: 30 mmol/L (ref 22–32)
Calcium: 10.2 mg/dL (ref 8.9–10.3)
Chloride: 103 mmol/L (ref 98–111)
Creatinine: 0.9 mg/dL (ref 0.44–1.00)
GFR, Estimated: >60 mL/min
Glucose, Bld: 123 mg/dL — ABNORMAL HIGH (ref 70–99)
Potassium: 2.9 mmol/L — ABNORMAL LOW (ref 3.5–5.1)
Sodium: 144 mmol/L (ref 135–145)
Total Bilirubin: 7.1 mg/dL (ref 0.0–1.2)
Total Protein: 7.4 g/dL (ref 6.5–8.1)

## 2024-09-09 MED ORDER — GADOBUTROL 1 MMOL/ML IV SOLN
7.5000 mL | Freq: Once | INTRAVENOUS | Status: AC | PRN
  Administered 2024-09-09: 7.5 mL via INTRAVENOUS

## 2024-09-09 NOTE — Telephone Encounter (Signed)
 Case for ERCP entered. Called the endo department. No nurse available. Message with staff member. Waiting for a call back.

## 2024-09-09 NOTE — Progress Notes (Signed)
 " [facility] OFFICE PROGRESS NOTE   Diagnosis: Pancreas cancer  INTERVAL HISTORY:   [name] returns as scheduled.  She completed a couple gemcitabine /Abraxane  08/19/2024.  Treatment was held 17 2025 due to progressive rise of liver enzymes.  She was referred for CT scans.  She denies nausea.  No diarrhea.  Main complaint is being tired.  She had some abdominal pain yesterday, none today.  Appetite has been decreased over the past few days.  No fever.  She feels cold at times, denies shaking chills.  Objective:  Vital signs in last 24 hours:  Blood pressure 132/74, pulse 91, temperature 98 F (36.7 C), temperature source Temporal, resp. rate 16, weight 168 lb 8 oz (76.4 kg), last menstrual period 02/07/2016, SpO2 97%.    HEENT: Scleral icterus. Resp: Lungs clear bilaterally. Cardio: Regular rate and rhythm. GI: No hepatosplenomegaly.  Nontender. Vascular: No leg edema. Port-A-Cath without erythema.  Lab Results:  Lab Results  Component Value Date   WBC 8.6 09/09/2024   HGB 12.2 09/09/2024   HCT 36.9 09/09/2024   MCV 90.4 09/09/2024   PLT 329 09/09/2024   NEUTROABS 5.6 09/09/2024    Imaging:  No results found.  Medications: I have reviewed the patient's current medications.  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 CT abdomen/pelvis 07/30/2022-body/neck pancreas mass with peripheral pancreatic atrophy and duct dilation, suspected encasement of the celiac axis, uterine fibroids MRI abdomen 07/30/2022-3.7 x 4.7 cm pancreas body/tail mass with encasement of the proximal celiac artery, occlusion with collateralization at the confluence of the SMV and portal vein, no evidence of metastatic disease CT chest 08/01/2022-no intrathoracic metastases Elevated CA 19-9 EUS 08/02/2022-gastric polyp, gastric erosions, 32 x 33 mm pancreas body mass, invasion of the celiac trunk, invasion of the splenoportal confluence, no SMA invasion, 2 malignant appearing nodes in  the porta hepatis,T4N1 by endoscopic criteria, FNA biopsy-adenocarcinoma 08/17/2022 Guardant360-tumor mutation burden 0.96; MSI high not detected; 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  Stable porta hepatis lymph nodes. Cycle 7 gemcitabine /Abraxane  11/09/2022 Cycle 8 gemcitabine /Abraxane  11/23/2022 Cycle 9 gemcitabine /Abraxane  12/07/2022 Cycle 10 gemcitabine /Abraxane  12/21/2022 Cycle 11 gemcitabine /Abraxane  01/04/2023 Cycle 12 gemcitabine /Abraxane  01/18/2023 Cycle 13 gemcitabine /Abraxane  02/01/2023 Cycle 14 gemcitabine /Abraxane  02/15/2023 CT abdomen/pelvis 02/26/2023-stable appearance of pancreas body mass with vascular encasement and chronic occlusion of the splenic vein, no evidence of metastatic disease Cycle 15 gemcitabine /Abraxane  03/01/2023 Cycle 16 gemcitabine /Abraxane  03/15/2023 Cycle 17 Gemcitabine /Abraxane  03/29/2023 EUS pancreas fiducial marker placement 04/17/2023 Pancreas SBRT 05/07/2023 - 05/17/2023, 33 Gray in 5 fractions CT abdomen/pelvis 09/14/2023: Slight decrease in pancreas body mass with persistent vascular encasement, no evidence of metastatic disease CTs [facility] 01/02/2024-ill-defined hypoattenuating mass in the mid pancreatic body surrounding surgical clips.  Pancreatic duct within the distal pancreatic body and tail is dilated.  Mass encases the celiac trunk.  Common bile duct is dilated with suggestion of focal stenosis in the mid bile duct.  High-grade stenosis/occlusion of the portal vein.  Prominent vessels in the upper abdomen.  Heterogeneous appearance of the  splenic parenchyma with suggestion of an infiltrating hypoattenuating mass/masses versus sequela of splenic infarcts.  Multiple pulmonary nodules in the lung bases concerning for metastatic disease. CTs chest/abdomen/pelvis 01/06/2024-no PE.  Cardiomegaly with small pericardial effusion.  Small  irregular focus of density medial right lung base.  Poorly defined pancreatic body mass measuring 3 x 2.2 cm, previously 3 x 2.5 cm, possible extension of infiltrative soft tissue towards the pancreatic neck region with ill-defined density at the porta hepatis compared to prior.  Slight increased extrahepatic biliary and common duct dilatation compared to prior.  No focal splenic abnormality. Cycle 1 gemcitabine /abraxane  02/07/2024 Cycle 2 gemcitabine /Abraxane  03/18/2024 Cycle 3 gemcitabine /Abraxane  04/01/2024, premed changed from Compazine  to Zofran  Cycle 4 gemcitabine /Abraxane  04/14/2024, premedication changed to Aloxi /Decadron  Cycle 5 gemcitabine /Abraxane  04/29/2024, premedication continued Aloxi /Decadron  Cycle 6 gemcitabine /Abraxane  05/14/2024, premedication continued Aloxi /Decadron  CTs 05/25/2024-stable right middle lobe and right lower lobe pulmonary lesions. No new pulmonary lesions. Decrease in size of pancreatic head/body junction lesion and tumor extending down along the celiac axis. No findings for hepatic metastatic disease.  Cycle 7 gemcitabine /Abraxane  05/27/2024, Gemcitabine  dose reduced due to thrombocytopenia Cycle 8 gemcitabine /Abraxane  06/10/2024 Cycle 9 gemcitabine /Abraxane  06/24/2024 Cycle 10 gemcitabine /Abraxane  07/08/2024 Cycle 11 gemcitabine /Abraxane  07/22/2024 Cycle 12 gemcitabine /Abraxane  08/05/2024 Cycle 13 gemcitabine /Abraxane  08/19/2024 Treatment held 09/02/2024 due to progressive rise of liver enzymes, referred for CTs CTs 09/06/2024-ill-defined hypoattenuating area in the region of the pancreatic head, grossly similar to the prior study.  Ill-defined linear configuration hypoattenuating area  paralleling the main portal vein.  Just inferior to the portal vein near the porta hepatis the area measures 2.0 x 3.4 cm.  Appears increased in size since the prior study.  Slightly irregular opacity in the central middle lobe inseparable from the adjacent pulmonary vessels and bronchus.  Appears grossly unchanged since the prior study.     2.  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  Similar size of of enhancing mass in the dorsal right midbrain effacing the cerebral aqueduct.  No new enhancing lesions identified.  She was transferred to [facility].  She underwent an endoscopic third ventriculostomy on 02/12/2024.   Brain CT 03/13/2024-interval resolution of obstructive hydrocephalus and transependymal edema.  Mild interval decrease in size of a mass within the right midbrain, compatible with glioma.  Chronic encephalomalacia changes in the right frontal lobe. 02/12/2024: Endoscopic ventriculostomy at [facility]   3.  Anorexia/weight loss secondary to #1 4.  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  Abdominal pain 6.  History of colon polyps 7.  Family history of multiple cancers Pancreas cancer maternal aunt, maternal cousin, maternal grandfather Breast cancer-maternal aunt, maternal cousin Prostate cancer-maternal uncle Lung cancer-father 8.  Hypertension 9.  Postmenopausal vaginal bleeding 10.  H. pylori 08/02/2022-amoxicillin  and Flagyl  discontinued 08/21/2022 due to GI symptoms 11.  Syncope event 03/07/2023 12.  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  Anaphylaxis following CT contrast 05/25/2024    Disposition: [name] has pancreas cancer.  She has most recently been treated with gemcitabine /Abraxane .  Treatment was held last week due to a progressive rise in LFTs.  We referred her for CT scans.     We reviewed the labs from today.  There is persistent elevation of liver enzymes and new elevation of bilirubin.  We reviewed the CT results.  She cannot have IV contrast due to anaphylaxis.  We reviewed CT findings with GI.  Concern is for biliary obstruction.  She will have MRI/MRCP later today.  Ms. Dinius understands to proceed to the emergency department with fever/chills, signs of infection.  We discussed the persistent hypokalemia.  She declines to take replacement.  Patient seen with Dr.  Sherrill.   Olam Ned ANP/GNP-BC   09/09/2024  10:38 AM   This was a shared visit with Olam Ned.  Ms. Lozada was interviewed and examined.  The liver enzymes are elevated and she has developed hyperbilirubinemia.  We reviewed the CT findings with her.  I reviewed the CT images.  She most likely has obstructive jaundice from the pancreas head mass or tumor at the porta hepatis.  We contacted gastroenterology.  An MRI/MRCP is recommended.  This is scheduled for later today.  She will follow-up with Dr. Avram for an urgent ERCP/stent placement depending on the MRI findings.  Arvella Hof, MD     "

## 2024-09-09 NOTE — Progress Notes (Signed)
 Scheduled MR Abdomen MRCP w wo contrast STAT at [facility] at 02:45 pm. Patient was notified in the office, stated would be at the appointment.

## 2024-09-09 NOTE — Telephone Encounter (Signed)
 Patient has jaundice in the setting of pancreatic cancer.  She is getting an MRCP today.  CT scanning was inconclusive though we suspect bile duct obstruction.  I left her a message about this I have been in communication with oncology also.  Please schedule for ERCP with stent placement at [facility] on Friday, December 26.  They told me there is space to do it in the later morning.   It says on her med list she is holding her Eliquis  we need to have her continue to do that.   I plan to follow-up on the MRCP she is having today and contact her again.  Regardless of whether you can reach her I want you to schedule this and let me know so we have the spot as I suspect we will need it.  Encounter Diagnoses  Name Primary?   Jaundice Yes   Malignant neoplasm of head of pancreas (HCC)     Other orders would be Unasyn  1.5 g IV on-call and diclofenac  100 mg   Thanks

## 2024-09-09 NOTE — Progress Notes (Signed)
 CRITICAL VALUE STICKER  CRITICAL VALUE: total bili 7.1  RECEIVER (on-site recipient of call):Samatha Anspach,RN  DATE & TIME NOTIFIED: 09-09-24 @ 1035  MESSENGER (representative from lab):Thersia  MD NOTIFIED: Olam Ned, NP  TIME OF NOTIFICATION: 1036  RESPONSE:  Being seen at present time in exam room

## 2024-09-09 NOTE — Telephone Encounter (Signed)
 Was able to speak to patient's sister Zahirah Cheslock who will relay this information to the patient  I reviewed MRCP and she clearly has biliary dilation and the clinical scenario is consistent with obstructive jaundice from her pancreatic cancer.  Plan is for ERCP with stent placement on Friday.  She is currently off Eliquis  and will hold that.

## 2024-09-09 NOTE — Patient Instructions (Signed)

## 2024-09-11 ENCOUNTER — Encounter (HOSPITAL_COMMUNITY)
Admission: RE | Disposition: A | Discharge status: Home / Self Care | Payer: Self-pay | Source: Home / Self Care | Attending: Internal Medicine

## 2024-09-11 ENCOUNTER — Ambulatory Visit (HOSPITAL_COMMUNITY)

## 2024-09-11 ENCOUNTER — Other Ambulatory Visit: Payer: Self-pay

## 2024-09-11 ENCOUNTER — Encounter (HOSPITAL_COMMUNITY): Payer: Self-pay | Admitting: Internal Medicine

## 2024-09-11 ENCOUNTER — Observation Stay (HOSPITAL_COMMUNITY)
Admission: RE | Admit: 2024-09-11 | Discharge: 2024-09-12 | Disposition: A | Discharge status: Home / Self Care | Attending: Internal Medicine | Admitting: Internal Medicine

## 2024-09-11 DIAGNOSIS — F419 Anxiety disorder, unspecified: Secondary | ICD-10-CM | POA: Diagnosis not present

## 2024-09-11 DIAGNOSIS — D649 Anemia, unspecified: Secondary | ICD-10-CM | POA: Diagnosis not present

## 2024-09-11 DIAGNOSIS — C251 Malignant neoplasm of body of pancreas: Secondary | ICD-10-CM | POA: Insufficient documentation

## 2024-09-11 DIAGNOSIS — K831 Obstruction of bile duct: Secondary | ICD-10-CM | POA: Diagnosis not present

## 2024-09-11 DIAGNOSIS — F17210 Nicotine dependence, cigarettes, uncomplicated: Secondary | ICD-10-CM | POA: Diagnosis not present

## 2024-09-11 DIAGNOSIS — E876 Hypokalemia: Secondary | ICD-10-CM | POA: Diagnosis not present

## 2024-09-11 DIAGNOSIS — F418 Other specified anxiety disorders: Secondary | ICD-10-CM

## 2024-09-11 DIAGNOSIS — E785 Hyperlipidemia, unspecified: Secondary | ICD-10-CM | POA: Diagnosis not present

## 2024-09-11 DIAGNOSIS — Z91041 Radiographic dye allergy status: Secondary | ICD-10-CM | POA: Diagnosis not present

## 2024-09-11 DIAGNOSIS — I10 Essential (primary) hypertension: Secondary | ICD-10-CM

## 2024-09-11 DIAGNOSIS — R17 Unspecified jaundice: Secondary | ICD-10-CM | POA: Diagnosis not present

## 2024-09-11 DIAGNOSIS — F32A Depression, unspecified: Secondary | ICD-10-CM | POA: Diagnosis not present

## 2024-09-11 DIAGNOSIS — F17200 Nicotine dependence, unspecified, uncomplicated: Secondary | ICD-10-CM

## 2024-09-11 DIAGNOSIS — C250 Malignant neoplasm of head of pancreas: Secondary | ICD-10-CM | POA: Insufficient documentation

## 2024-09-11 DIAGNOSIS — E441 Mild protein-calorie malnutrition: Secondary | ICD-10-CM | POA: Insufficient documentation

## 2024-09-11 HISTORY — PX: ERCP: SHX5425

## 2024-09-11 HISTORY — PX: ESOPHAGOGASTRODUODENOSCOPY: SHX5428

## 2024-09-11 LAB — COMPREHENSIVE METABOLIC PANEL WITH GFR
ALT: 88 U/L — ABNORMAL HIGH (ref 0–44)
AST: 109 U/L — ABNORMAL HIGH (ref 15–41)
Albumin: 3.7 g/dL (ref 3.5–5.0)
Alkaline Phosphatase: 404 U/L — ABNORMAL HIGH (ref 38–126)
Anion gap: 12 (ref 5–15)
BUN: 7 mg/dL (ref 6–20)
CO2: 29 mmol/L (ref 22–32)
Calcium: 9.8 mg/dL (ref 8.9–10.3)
Chloride: 103 mmol/L (ref 98–111)
Creatinine, Ser: 0.81 mg/dL (ref 0.44–1.00)
GFR, Estimated: >60 mL/min
Glucose, Bld: 104 mg/dL — ABNORMAL HIGH (ref 70–99)
Potassium: 3.1 mmol/L — ABNORMAL LOW (ref 3.5–5.1)
Sodium: 144 mmol/L (ref 135–145)
Total Bilirubin: 8.4 mg/dL — ABNORMAL HIGH (ref 0.0–1.2)
Total Protein: 7.3 g/dL (ref 6.5–8.1)

## 2024-09-11 LAB — PROTIME-INR
INR: 1 (ref 0.8–1.2)
Prothrombin Time: 13.5 s (ref 11.4–15.2)

## 2024-09-11 LAB — CBC
HCT: 37.7 % (ref 36.0–46.0)
Hemoglobin: 12.8 g/dL (ref 12.0–15.0)
MCH: 30.4 pg (ref 26.0–34.0)
MCHC: 34 g/dL (ref 30.0–36.0)
MCV: 89.5 fL (ref 80.0–100.0)
Platelets: 229 K/uL (ref 150–400)
RBC: 4.21 MIL/uL (ref 3.87–5.11)
RDW: 18.8 % — ABNORMAL HIGH (ref 11.5–15.5)
WBC: 6.6 K/uL (ref 4.0–10.5)
nRBC: 0 % (ref 0.0–0.2)

## 2024-09-11 LAB — MAGNESIUM: Magnesium: 1.9 mg/dL (ref 1.7–2.4)

## 2024-09-11 MED ORDER — PHENYLEPHRINE 80 MCG/ML (10ML) SYRINGE FOR IV PUSH (FOR BLOOD PRESSURE SUPPORT)
PREFILLED_SYRINGE | INTRAVENOUS | Status: DC | PRN
  Administered 2024-09-11: 80 ug via INTRAVENOUS

## 2024-09-11 MED ORDER — DICLOFENAC SUPPOSITORY 100 MG
RECTAL | Status: AC
  Filled 2024-09-11: qty 1

## 2024-09-11 MED ORDER — GLUCAGON HCL RDNA (DIAGNOSTIC) 1 MG IJ SOLR
INTRAMUSCULAR | Status: AC
  Filled 2024-09-11: qty 1

## 2024-09-11 MED ORDER — DEXAMETHASONE SOD PHOSPHATE PF 10 MG/ML IJ SOLN
INTRAMUSCULAR | Status: DC | PRN
  Administered 2024-09-11: 4 mg via INTRAVENOUS

## 2024-09-11 MED ORDER — PREDNISONE 50 MG PO TABS: 50.0000 mg | ORAL_TABLET | Freq: Every day | ORAL | Status: DC

## 2024-09-11 MED ORDER — HYDRALAZINE HCL 20 MG/ML IJ SOLN
10.0000 mg | INTRAMUSCULAR | Status: DC | PRN
  Administered 2024-09-12: 10 mg via INTRAVENOUS
  Filled 2024-09-11: qty 1

## 2024-09-11 MED ORDER — POTASSIUM CHLORIDE 20 MEQ PO PACK
60.0000 meq | PACK | Freq: Once | ORAL | Status: AC
  Administered 2024-09-11: 60 meq via ORAL
  Filled 2024-09-11: qty 3

## 2024-09-11 MED ORDER — FENTANYL CITRATE (PF) 100 MCG/2ML IJ SOLN
INTRAMUSCULAR | Status: AC
  Filled 2024-09-11: qty 2

## 2024-09-11 MED ORDER — ONDANSETRON HCL 4 MG PO TABS: 4.0000 mg | ORAL_TABLET | Freq: Four times a day (QID) | ORAL | Status: DC | PRN

## 2024-09-11 MED ORDER — PREDNISONE 50 MG PO TABS
50.0000 mg | ORAL_TABLET | Freq: Once | ORAL | Status: AC
  Administered 2024-09-11: 50 mg via ORAL
  Filled 2024-09-11: qty 1

## 2024-09-11 MED ORDER — SODIUM CHLORIDE 0.9 % IV SOLN: INTRAVENOUS | Status: DC

## 2024-09-11 MED ORDER — POTASSIUM CHLORIDE CRYS ER 20 MEQ PO TBCR
40.0000 meq | EXTENDED_RELEASE_TABLET | Freq: Four times a day (QID) | ORAL | Status: DC
  Administered 2024-09-11: 40 meq via ORAL
  Filled 2024-09-11: qty 2

## 2024-09-11 MED ORDER — PROPOFOL 500 MG/50ML IV EMUL
INTRAVENOUS | Status: DC | PRN
  Administered 2024-09-11: 200 ug/kg/min via INTRAVENOUS

## 2024-09-11 MED ORDER — POTASSIUM CHLORIDE 10 MEQ/100ML IV SOLN
10.0000 meq | INTRAVENOUS | Status: DC
  Filled 2024-09-11: qty 100

## 2024-09-11 MED ORDER — AMLODIPINE BESYLATE 5 MG PO TABS
5.0000 mg | ORAL_TABLET | Freq: Every day | ORAL | Status: DC
  Administered 2024-09-11 – 2024-09-12 (×2): 5 mg via ORAL
  Filled 2024-09-11 (×3): qty 1

## 2024-09-11 MED ORDER — PREDNISONE 50 MG PO TABS
50.0000 mg | ORAL_TABLET | Freq: Once | ORAL | Status: DC
  Filled 2024-09-11 (×2): qty 1

## 2024-09-11 MED ORDER — SODIUM CHLORIDE 0.9% FLUSH
10.0000 mL | Freq: Two times a day (BID) | INTRAVENOUS | Status: DC
  Administered 2024-09-12: 10 mL

## 2024-09-11 MED ORDER — OXYCODONE HCL 5 MG PO TABS: 5.0000 mg | ORAL_TABLET | Freq: Once | ORAL | Status: DC | PRN

## 2024-09-11 MED ORDER — ACETAMINOPHEN 10 MG/ML IV SOLN: 1000.0000 mg | Freq: Once | INTRAVENOUS | Status: DC | PRN

## 2024-09-11 MED ORDER — DICLOFENAC SUPPOSITORY 100 MG: 100.0000 mg | Freq: Once | RECTAL | Status: DC

## 2024-09-11 MED ORDER — GLUCAGON HCL RDNA (DIAGNOSTIC) 1 MG IJ SOLR
INTRAMUSCULAR | Status: DC | PRN
  Administered 2024-09-11 (×2): .5 mg via INTRAVENOUS

## 2024-09-11 MED ORDER — PROPOFOL 10 MG/ML IV BOLUS
INTRAVENOUS | Status: DC | PRN
  Administered 2024-09-11: 150 mg via INTRAVENOUS

## 2024-09-11 MED ORDER — ACETAMINOPHEN 650 MG RE SUPP: 650.0000 mg | Freq: Four times a day (QID) | RECTAL | Status: DC | PRN

## 2024-09-11 MED ORDER — LIDOCAINE HCL (CARDIAC) PF 100 MG/5ML IV SOSY
PREFILLED_SYRINGE | INTRAVENOUS | Status: DC | PRN
  Administered 2024-09-11: 80 mg via INTRAVENOUS

## 2024-09-11 MED ORDER — FENTANYL CITRATE (PF) 100 MCG/2ML IJ SOLN: 25.0000 ug | INTRAMUSCULAR | Status: DC | PRN

## 2024-09-11 MED ORDER — GLYCOPYRROLATE PF 0.2 MG/ML IJ SOSY
PREFILLED_SYRINGE | INTRAMUSCULAR | Status: DC | PRN
  Administered 2024-09-11: .2 mg via INTRAVENOUS

## 2024-09-11 MED ORDER — METHYLPREDNISOLONE SODIUM SUCC 40 MG IJ SOLR
40.0000 mg | Freq: Once | INTRAMUSCULAR | Status: AC
  Administered 2024-09-12: 40 mg via INTRAVENOUS
  Filled 2024-09-11: qty 1

## 2024-09-11 MED ORDER — SODIUM CHLORIDE 0.9 % IV SOLN
1.5000 g | Freq: Once | INTRAVENOUS | Status: AC
  Administered 2024-09-11: 1.5 g via INTRAVENOUS
  Filled 2024-09-11: qty 4

## 2024-09-11 MED ORDER — OXYCODONE HCL 5 MG PO TABS: 5.0000 mg | ORAL_TABLET | Freq: Four times a day (QID) | ORAL | Status: DC | PRN

## 2024-09-11 MED ORDER — DICLOFENAC SUPPOSITORY 100 MG
RECTAL | Status: DC | PRN
  Administered 2024-09-11: 100 mg via RECTAL

## 2024-09-11 MED ORDER — CHLORHEXIDINE GLUCONATE CLOTH 2 % EX PADS
6.0000 | MEDICATED_PAD | Freq: Every day | CUTANEOUS | Status: DC
  Administered 2024-09-12: 6 via TOPICAL

## 2024-09-11 MED ORDER — ONDANSETRON HCL 4 MG/2ML IJ SOLN: 4.0000 mg | Freq: Four times a day (QID) | INTRAMUSCULAR | Status: DC | PRN

## 2024-09-11 MED ORDER — MAGNESIUM SULFATE 2 GM/50ML IV SOLN
2.0000 g | Freq: Once | INTRAVENOUS | Status: AC
  Administered 2024-09-11: 2 g via INTRAVENOUS
  Filled 2024-09-11: qty 50

## 2024-09-11 MED ORDER — CIPROFLOXACIN IN D5W 400 MG/200ML IV SOLN
INTRAVENOUS | Status: AC
  Filled 2024-09-11: qty 200

## 2024-09-11 MED ORDER — PHENYLEPHRINE HCL-NACL 20-0.9 MG/250ML-% IV SOLN
INTRAVENOUS | Status: DC | PRN
  Administered 2024-09-11: 15 ug/min via INTRAVENOUS

## 2024-09-11 MED ORDER — DROPERIDOL 2.5 MG/ML IJ SOLN: 0.6250 mg | Freq: Once | INTRAMUSCULAR | Status: DC | PRN

## 2024-09-11 MED ORDER — PROPOFOL 10 MG/ML IV BOLUS
INTRAVENOUS | Status: AC
  Filled 2024-09-11: qty 20

## 2024-09-11 MED ORDER — ONDANSETRON HCL 4 MG/2ML IJ SOLN
INTRAMUSCULAR | Status: DC | PRN
  Administered 2024-09-11: 4 mg via INTRAVENOUS

## 2024-09-11 MED ORDER — LACTATED RINGERS IV SOLN
INTRAVENOUS | Status: AC | PRN
  Administered 2024-09-11: 1000 mL via INTRAVENOUS

## 2024-09-11 MED ORDER — DIPHENHYDRAMINE HCL 50 MG/ML IJ SOLN
50.0000 mg | Freq: Once | INTRAMUSCULAR | Status: AC
  Administered 2024-09-12: 50 mg via INTRAVENOUS
  Filled 2024-09-11: qty 1

## 2024-09-11 MED ORDER — OXYCODONE HCL 5 MG/5ML PO SOLN: 5.0000 mg | Freq: Once | ORAL | Status: DC | PRN

## 2024-09-11 MED ORDER — SUCCINYLCHOLINE CHLORIDE 200 MG/10ML IV SOSY
PREFILLED_SYRINGE | INTRAVENOUS | Status: DC | PRN
  Administered 2024-09-11: 80 mg via INTRAVENOUS

## 2024-09-11 MED ORDER — ACETAMINOPHEN 325 MG PO TABS: 650.0000 mg | ORAL_TABLET | Freq: Four times a day (QID) | ORAL | Status: DC | PRN

## 2024-09-11 MED ORDER — FENTANYL CITRATE (PF) 100 MCG/2ML IJ SOLN
INTRAMUSCULAR | Status: DC | PRN
  Administered 2024-09-11: 100 ug via INTRAVENOUS

## 2024-09-11 MED ORDER — MIRTAZAPINE 15 MG PO TABS
7.5000 mg | ORAL_TABLET | Freq: Every day | ORAL | Status: DC
  Filled 2024-09-11: qty 1

## 2024-09-11 MED ORDER — GADOBUTROL 1 MMOL/ML IV SOLN: 20.0000 mL | Freq: Once | INTRAVENOUS | Status: DC

## 2024-09-11 NOTE — Anesthesia Procedure Notes (Signed)
 Procedure Name: Intubation Date/Time: 09/11/2024 11:08 AM  Performed by: Nada Corean CROME, CRNAPre-anesthesia Checklist: Emergency Drugs available, Patient identified, Suction available, Patient being monitored and Timeout performed Patient Re-evaluated:Patient Re-evaluated prior to induction Oxygen Delivery Method: Circle system utilized Preoxygenation: Pre-oxygenation with 100% oxygen Induction Type: IV induction, Rapid sequence and Cricoid Pressure applied Laryngoscope Size: Mac and 3 Grade View: Grade I Tube type: Oral Tube size: 7.0 mm Number of attempts: 1 Airway Equipment and Method: Stylet Placement Confirmation: ETT inserted through vocal cords under direct vision, positive ETCO2 and breath sounds checked- equal and bilateral Secured at: 21 cm Tube secured with: Tape Dental Injury: Teeth and Oropharynx as per pre-operative assessment

## 2024-09-11 NOTE — H&P (Signed)
 " History and Physical    Patient: Kathleen Stone FMW:[id number] DOB: May 03, 1966 DOA: 09/11/2024 DOS: the patient was seen and examined on 09/11/2024 PCP: Harrie Bruckner, DO  Patient coming from: Home/endoscopic suite.  Chief Complaint: Obstructive jaundice.  HPI: Kathleen Stone is a 58 y.o. female with medical history significant of abnormal stress test, benign brain tumor, DJD of cervical spine, bilateral galactorrhea, microscopic hematuria, hyperlipidemia, hypertension, hypokalemia, nausea and emesis, history of CVA, tobacco abuse history of pancreatic cancer who was undergoing an ERCP earlier today with Dr. Avram that was unsuccessful, but after reviewing imaging that she has been scheduled for a retrial of the procedure tomorrow.  She denied fever, chills, rhinorrhea, sore throat, wheezing or hemoptysis.  No chest pain, palpitations, diaphoresis, PND, orthopnea or pitting edema of the lower extremities.  Her stools have been acholic, no constipation, melena or hematochezia.  No flank pain, dysuria, frequency or hematuria.  No polyuria, polydipsia, polyphagia or blurred vision.   Lab work: CBC showed white count 6.6, hemoglobin 12.8 g/dL platelets 770.  Normal PT and INR.  CMP/chemistry magnesium  was 1.9, glucose 104 and total bilirubin 8.4 mg/dL.  AST 809, ALT 88 and alkaline phosphatase 404 units/L.  Potassium is 3.1 mmol/L with the rest of the electrolytes, albumin, total protein and renal function within normal range.   Review of Systems: As mentioned in the history of present illness. All other systems reviewed and are negative. Past Medical History:  Diagnosis Date   Abnormal stress test 01/16/2017   Brain tumor (benign) (HCC)    DJD (degenerative joint disease) of cervical spine    Galactorrhea of both breasts 2009   R. sided w/ benign papilloma excised in 4/09. L sided in 5/09   Hematuria, microscopic 02/24/2020   Hyperlipidemia    Hypertension    Hypokalemia    LOW  BACK PAIN SYNDROME 07/10/2007   Qualifier: Diagnosis of   By: Lanis MD, Christopher         Nausea and vomiting 08/01/2022   Pancreatic cancer [facility])    Personal history of adenomatous colonic polyp    09/2007 - diminutive adenoma [facility])   Stroke [facility])    2021- brain tumor bx caused ? stroke-    Tobacco abuse    Unintentional weight loss 05/26/2022   Past Surgical History:  Procedure Laterality Date   APPENDECTOMY     BIOPSY  08/02/2022   Procedure: BIOPSY;  Surgeon: Wilhelmenia Aloha Raddle., MD;  Location: [facility];  Service: Gastroenterology;;   BIOPSY  04/17/2023   Procedure: BIOPSY;  Surgeon: Wilhelmenia Aloha Raddle., MD;  Location: [facility];  Service: Gastroenterology;;   BRAIN BIOPSY     [id number]   BREAST SURGERY Right 12/2007   central duct excision   CHOLECYSTECTOMY N/A 12/08/2019   Procedure: LAPAROSCOPIC CHOLECYSTECTOMY;  Surgeon: Rubin Calamity, MD;  Location: [facility] OR;  Service: General;  Laterality: N/A;   COLONOSCOPY  2009   COLONOSCOPY  09/14/2020   COLONOSCOPY W/ BIOPSIES AND POLYPECTOMY  09/2007   Dr. Avram. 2 small adenomatous polyps, internal hemorrhoids-Grade 1. Rec  routine colonoscopy at age 46.   COLONOSCOPY W/ BIOPSIES AND POLYPECTOMY     ESOPHAGOGASTRODUODENOSCOPY (EGD) WITH PROPOFOL  N/A 08/02/2022   Procedure: ESOPHAGOGASTRODUODENOSCOPY (EGD) WITH PROPOFOL ;  Surgeon: Wilhelmenia Aloha Raddle., MD;  Location: [facility];  Service: Gastroenterology;  Laterality: N/A;   ESOPHAGOGASTRODUODENOSCOPY (EGD) WITH PROPOFOL  N/A 04/17/2023   Procedure: ESOPHAGOGASTRODUODENOSCOPY (EGD) WITH PROPOFOL ;  Surgeon: Wilhelmenia Aloha Raddle., MD;  Location: [facility];  Service: Gastroenterology;  Laterality: N/A;   EUS N/A 08/02/2022   Procedure: FULL UPPER ENDOSCOPIC ULTRASOUND (EUS) RADIAL;  Surgeon: Wilhelmenia Aloha Raddle., MD;  Location: [facility];  Service: Gastroenterology;  Laterality: N/A;   EUS N/A 04/17/2023   Procedure: UPPER ENDOSCOPIC ULTRASOUND (EUS)  RADIAL;  Surgeon: Wilhelmenia Aloha Raddle., MD;  Location: [facility];  Service: Gastroenterology;  Laterality: N/A;   FIDUCIAL MARKER PLACEMENT N/A 04/17/2023   Procedure: FIDUCIAL MARKER PLACEMENT;  Surgeon: Wilhelmenia Aloha Raddle., MD;  Location: [facility];  Service: Gastroenterology;  Laterality: N/A;   FINE NEEDLE ASPIRATION N/A 08/02/2022   Procedure: FINE NEEDLE ASPIRATION (FNA) LINEAR;  Surgeon: Wilhelmenia Aloha Raddle., MD;  Location: [facility];  Service: Gastroenterology;  Laterality: N/A;   IR IMAGING GUIDED PORT INSERTION  08/03/2022   NASAL SEPTOPLASTY W/ TURBINOPLASTY Bilateral 04/2008   Septoplasty with bilateral inferior turbinate reduction; Dr. Ethyl   POLYPECTOMY  08/02/2022   Procedure: POLYPECTOMY;  Surgeon: Wilhelmenia Aloha Raddle., MD;  Location: [facility];  Service: Gastroenterology;;   SHOULDER OPEN ROTATOR CUFF REPAIR Right     after hurting it by moving heavy objecs   Social History:  reports that she has been smoking cigarettes. She has a 54 pack-year smoking history. She has never used smokeless tobacco. She reports that she does not drink alcohol and does not use drugs.  Allergies[1]  Family History  Problem Relation Age of Onset   Heart failure Mother    Diabetes Mother    Lung cancer Father        dx 56s-80s   Breast cancer Maternal Aunt        d. 26   Pancreatic cancer Maternal Aunt        d. 60   Prostate cancer Maternal Uncle        d. 40   Pancreatic cancer Maternal Grandfather        d. 7s   Breast cancer Cousin        dx 19s; maternal female cousin   Stomach cancer Cousin        d. 25; maternal female cousin   Pancreatic cancer Cousin        d. 8s; maternal female cousin   Colon cancer Neg Hx    Colon polyps Neg Hx    Esophageal cancer Neg Hx    Rectal cancer Neg Hx     Prior to Admission medications  Medication Sig Start Date End Date Taking? Authorizing Provider  amLODipine  (NORVASC ) 5 MG tablet Take 1 tablet (5 mg total) by  mouth daily. 01/08/24 01/07/25 Yes Tawkaliyar, Roya, DO  loperamide (IMODIUM) 2 MG capsule Take 2-4 mg by mouth as needed for diarrhea or loose stools. Maximum 8/day   Yes [provider]  oxyCODONE  (OXY IR/ROXICODONE ) 5 MG immediate release tablet Take 5 mg by mouth every 6 (six) hours as needed for severe pain (pain score 7-10) (Reported by patient).   Yes [provider]  prochlorperazine  (COMPAZINE ) 10 MG tablet Take 1 tablet (10 mg total) by mouth every 6 (six) hours as needed. 04/14/24  Yes Cloretta Arley NOVAK, MD  acetaminophen  (TYLENOL ) 325 MG tablet Take by mouth every 6 (six) hours as needed. Patient not taking: Reported on 09/09/2024 02/13/24   [provider]  albuterol  (VENTOLIN  HFA) 108 (90 Base) MCG/ACT inhaler Inhale 2 puffs into the lungs every 6 (six) hours as needed for wheezing or shortness of breath. Patient not taking: Reported on 09/09/2024 02/15/23   Cloretta Arley NOVAK, MD  apixaban  (ELIQUIS ) 5 MG TABS tablet Take 1 tablet (5 mg total) by mouth 2 (two) times daily. Patient not taking: Reported on 09/09/2024 01/08/24   Tawkaliyar, Roya, DO  dexamethasone  (DECADRON ) 4 MG tablet Take 1 tablet (4 mg total) by mouth 2 (two) times daily. Take one tablet twice daily x 3 days after chemo. Start on 06/25/2024 Patient not taking: Reported on 09/09/2024 06/24/24   Debby Olam POUR, NP  EPINEPHrine  0.3 mg/0.3 mL IJ SOAJ injection Inject 0.3 mg into the muscle as needed for anaphylaxis. Patient not taking: Reported on 09/09/2024 05/25/24   Dreama Longs, MD  lidocaine -prilocaine  (EMLA ) cream Apply 1 Application topically as needed. 07/08/24   Cloretta Arley NOVAK, MD  lipase/protease/amylase (CREON ) 36000 UNITS CPEP capsule Take 2 capsules (72,000 Units total) by mouth 3 (three) times daily with meals. Patient not taking: Reported on 04/09/2024 01/09/24   Debby Olam POUR, NP  Melatonin 5 MG CAPS Take 1 capsule (5 mg total) by mouth at bedtime. Patient not taking: Reported on  09/09/2024 06/04/24   Homer Shams, MD  mirtazapine  (REMERON ) 7.5 MG tablet Take 1 tablet (7.5 mg total) by mouth at bedtime. 07/10/24 10/08/24  Carrion-Carrero, Shams, MD  nicotine  (NICODERM CQ  - DOSED IN MG/24 HOURS) 14 mg/24hr patch Place 1 patch (14 mg total) onto the skin daily. Patient not taking: Reported on 09/09/2024 07/10/24   Homer Shams, MD  nitroGLYCERIN  (NITROSTAT ) 0.4 MG SL tablet Place 1 tablet (0.4 mg total) under the tongue every 5 (five) minutes as needed for chest pain. Patient not taking: Reported on 09/09/2024 11/01/21 04/11/25  Atway, Rayann N, DO  ondansetron  (ZOFRAN ) 8 MG tablet Take 1 tablet (8 mg total) by mouth every 8 (eight) hours as needed. Patient not taking: Reported on 09/09/2024 01/31/24   Cloretta Arley NOVAK, MD  pantoprazole  (PROTONIX ) 40 MG tablet Take 1 tablet (40 mg total) by mouth daily. Patient not taking: Reported on 04/14/2024 01/29/24   Cloretta Arley NOVAK, MD  potassium chloride  SA (KLOR-CON  M) 20 MEQ tablet Take 1 tablet (20 mEq total) by mouth 2 (two) times daily. Patient not taking: Reported on 09/09/2024 08/19/24   Burton, Lacie K, NP  RESTASIS 0.05 % ophthalmic emulsion Place 1 drop into both eyes 2 (two) times daily. Patient not taking: Reported on 09/09/2024 05/12/24   [provider]    Physical Exam: Vitals:   09/11/24 1030 09/11/24 1315 09/11/24 1320 09/11/24 1330  BP: (!) 191/64 (!) 186/87 (!) 194/86 (!) 198/81  Pulse:  64 64 65  Resp: 12 17 14 11   Temp: 98.6 F (37 C) 97.6 F (36.4 C)    TempSrc: Temporal Temporal    SpO2: 99% 100% 100% 94%  Weight: 76.2 kg     Height: 5' 6 (1.676 m)      Physical Exam Vitals and nursing note reviewed.  Constitutional:      General: She is awake. She is not in acute distress.    Appearance: She is ill-appearing.  HENT:     Head: Normocephalic.     Nose: No rhinorrhea.     Mouth/Throat:     Mouth: Mucous membranes are moist.  Eyes:     General: Scleral icterus  present.     Pupils: Pupils are equal, round, and reactive to light.  Neck:     Vascular: No JVD.  Cardiovascular:     Rate and Rhythm: Normal rate and regular rhythm.     Heart sounds: S1 normal and S2 normal.  Pulmonary:  Effort: No accessory muscle usage.     Breath sounds: No wheezing, rhonchi or rales.  Abdominal:     General: Bowel sounds are normal. There is no distension.     Palpations: Abdomen is soft.     Tenderness: There is no abdominal tenderness. There is no right CVA tenderness or left CVA tenderness.  Musculoskeletal:     Cervical back: Neck supple.     Right lower leg: No edema.     Left lower leg: No edema.  Skin:    General: Skin is warm and dry.     Coloration: Skin is jaundiced.  Neurological:     General: No focal deficit present.     Mental Status: She is alert and oriented to person, place, and time.  Psychiatric:        Mood and Affect: Mood normal.        Behavior: Behavior normal. Behavior is cooperative.     Data Reviewed:  Results are pending, will review when available.  Assessment and Plan: Principal Problem:   Obstructive jaundice (HCC) Observation/telemetry. Clear liquid diet. Keep n.p.o. after midnight. Analgesics as needed. Antiemetics as needed. Follow CBC, CMP in AM. GI input appreciated.  Active Problems:   Hypokalemia Replacing. Magnesium  was supplemented. Will follow K level tomorrow morning.    Mild protein malnutrition In the setting of malignancy. May benefit from protein supplementation. Consider nutritional services evaluation. Follow-up albumin level.    Essential hypertension Resume amlodipine  5 mg p.o. daily. May use oral or parenteral antihypertensives as needed.    Hyperlipidemia Statin therapy contraindicated at this time. Follow-up with PCP as an outpatient.    Anxiety and depression Stop taking mirtazapine . Will use while in the hospital.    Advance Care Planning:   Code Status: Full Code    Consults:   Family Communication:   Severity of Illness: The appropriate patient status for this patient is OBSERVATION. Observation status is judged to be reasonable and necessary in order to provide the required intensity of service to ensure the patient's safety. The patient's presenting symptoms, physical exam findings, and initial radiographic and laboratory data in the context of their medical condition is felt to place them at decreased risk for further clinical deterioration. Furthermore, it is anticipated that the patient will be medically stable for discharge from the hospital within 2 midnights of admission.   Author: Alm Dorn Castor, MD 09/11/2024 1:54 PM  For on call review [email or web address].   This document was prepared using Dragon voice recognition software and may contain some unintended transcription errors.     [1]  Allergies Allergen Reactions   Iohexol  Anaphylaxis, Hives and Other (See Comments)    05/25/24 - Pt given contrast for CT after 13 hr prep, had reaction and needed care in ER per MD evaluation.    "

## 2024-09-11 NOTE — Anesthesia Preprocedure Evaluation (Addendum)
"                                    Anesthesia Evaluation  Patient identified by MRN, date of birth, ID band Patient awake    Reviewed: Allergy & Precautions, H&P , NPO status , Patient's Chart, lab work & pertinent test results  History of Anesthesia Complications Negative for: history of anesthetic complications  Airway Mallampati: II  TM Distance: >3 FB Neck ROM: Full    Dental no notable dental hx.    Pulmonary neg COPD, Current Smoker   Pulmonary exam normal breath sounds clear to auscultation       Cardiovascular hypertension, (-) angina (-) Past MI Normal cardiovascular exam Rhythm:Regular Rate:Normal     Neuro/Psych  Headaches PSYCHIATRIC DISORDERS Anxiety Depression    Hx of intracranial tumor  Neuromuscular disease CVA    GI/Hepatic ,neg GERD  ,,Biliary obstruction Pancreatic cancer    Endo/Other  negative endocrine ROS    Renal/GU negative Renal ROS  negative genitourinary   Musculoskeletal  (+) Arthritis ,    Abdominal   Peds negative pediatric ROS (+)  Hematology negative hematology ROS (+)   Anesthesia Other Findings   Reproductive/Obstetrics negative OB ROS                              Anesthesia Physical Anesthesia Plan  ASA: 3  Anesthesia Plan: General   Post-op Pain Management: Minimal or no pain anticipated   Induction: Intravenous  PONV Risk Score and Plan: 2 and Ondansetron  and Dexamethasone   Airway Management Planned: Oral ETT  Additional Equipment: None  Intra-op Plan:   Post-operative Plan: Extubation in OR  Informed Consent: I have reviewed the patients History and Physical, chart, labs and discussed the procedure including the risks, benefits and alternatives for the proposed anesthesia with the patient or authorized representative who has indicated his/her understanding and acceptance.     Dental advisory given  Plan Discussed with: CRNA  Anesthesia Plan Comments:           Anesthesia Quick Evaluation  "

## 2024-09-11 NOTE — Anesthesia Postprocedure Evaluation (Signed)
"   Anesthesia Post Note  Patient: Kathleen Stone  Procedure(s) Performed: ERCP, WITH INTERVENTION IF INDICATED EGD (ESOPHAGOGASTRODUODENOSCOPY)     Patient location during evaluation: PACU Anesthesia Type: General Level of consciousness: awake and alert Pain management: pain level controlled Vital Signs Assessment: post-procedure vital signs reviewed and stable Respiratory status: spontaneous breathing, nonlabored ventilation and respiratory function stable Cardiovascular status: blood pressure returned to baseline and stable Postop Assessment: no apparent nausea or vomiting Anesthetic complications: no   No notable events documented.  Last Vitals:  Vitals:   09/11/24 1350 09/11/24 1408  BP: (!) 196/74 (!) 193/72  Pulse: 66 68  Resp: 15   Temp:    SpO2: 95% 100%    Last Pain:  Vitals:   09/11/24 1408  TempSrc:   PainSc: 0-No pain                 Butler Levander Pinal      "

## 2024-09-11 NOTE — H&P (Signed)
 [facility] History and Physical   Primary Care Physician:  Harrie Bruckner, DO   Reason for Procedure:  Obstructive jaundice  Plan:    ERCP, start with gastroscope to evaluate for possible gastric outlet obstruction.  Use gadolinium contrast given prior breakthrough reaction despite steroid pretreatment with iohexol  although still very unlikely to have reactions with biliary injection of contrast.   The patient was provided an opportunity to ask questions and all were answered. The patient agreed with the plan.   HPI: Kathleen Stone is a 58 y.o. female with pancreatic cancer who has developed jaundice, imaging has shown dilated bile ducts as reflected in the MRCP report pasted below.  I have reviewed those images.  She has scleral icterus, dark urine and acholic stools.  She has epigastric pain also.  Treatment is on hold due to jaundice.  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  She does have a history of iohexol  contrast allergy, she had pretreatment for her September CT scanning but still developed some itching and hypotension and was treated with steroids and epinephrine .  Note that the imaging on the MRCP has shown a somewhat dilated stomach that is fluid and food filled.   MRCP 09/09/2024 IMPRESSION: 1. 2.8 x 1.9 cm amorphous hypoenhancing lesion in the head of the pancreas with diffuse atrophy of pancreatic parenchyma through the body and tail the pancreas and diffuse main duct dilatation. Imaging features are compatible with known pancreatic adenocarcinoma. 2. Marked progression of biliary dilatation since the 05/25/2024 exam with abrupt cut off of the common bile duct in the head of the pancreas immediately adjacent to the pancreatic head lesion. No evidence for choledocholithiasis. Patient is scheduled for ERCP with stent placement 09/11/2024. 3. 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 Portal vein reconstitutes distal to the portal splenic confluence and is patent through the left and right divisions. 4. Nonocclusive thrombus in the SMV as it enters the confluence with the splenic vein. Splenic vein patency cannot be confirmed on this study. 5. Loss of fat around the SMA origin suggesting encasement. 21 x 13 mm focus of enhancing soft tissue is seen abutting the SMA compatible with metastatic spread. 6. Marked attenuation of the left renal vein as it crosses anterior to the aorta and may be involved by tumor. 7. Upper normal lymph nodes in the hepatoduodenal ligament.    Lab Results  Component Value Date   WBC 8.6 09/09/2024   HGB 12.2 09/09/2024   HCT 36.9 09/09/2024   MCV 90.4 09/09/2024   PLT 329 09/09/2024    Lab Results  Component Value Date   ALT 91 (H) 09/09/2024   AST 96 (H) 09/09/2024   ALKPHOS 415 (H) 09/09/2024   BILITOT 7.1 (HH) 09/09/2024    Lab Results  Component Value Date   NA 144 09/09/2024   CL 103 09/09/2024   K 2.9 (L) 09/09/2024   CO2 30 09/09/2024   BUN 9 09/09/2024   CREATININE 0.90 09/09/2024   GFRNONAA >60 09/09/2024   CALCIUM  10.2 09/09/2024   ALBUMIN 4.0 09/09/2024   GLUCOSE 123 (H) 09/09/2024      Past Medical History:  Diagnosis Date   Abnormal stress test 01/16/2017   Brain tumor (benign) (HCC)    DJD (degenerative joint disease) of cervical spine    Galactorrhea of both breasts  2009   R. sided w/ benign papilloma excised in 4/09. L sided in 5/09   Hematuria, microscopic 02/24/2020   Hyperlipidemia    Hypertension    Hypokalemia    LOW BACK PAIN SYNDROME 07/10/2007   Qualifier: Diagnosis of   By: Lanis MD, Christopher         Nausea and vomiting 08/01/2022   Pancreatic cancer [facility])    Personal history of adenomatous colonic polyp    09/2007 - diminutive adenoma [facility])   Stroke [facility])    2021- brain tumor bx  caused ? stroke-    Tobacco abuse    Unintentional weight loss 05/26/2022    Past Surgical History:  Procedure Laterality Date   APPENDECTOMY     BIOPSY  08/02/2022   Procedure: BIOPSY;  Surgeon: Wilhelmenia Aloha Raddle., MD;  Location: [facility];  Service: Gastroenterology;;   BIOPSY  04/17/2023   Procedure: BIOPSY;  Surgeon: Wilhelmenia Aloha Raddle., MD;  Location: [facility];  Service: Gastroenterology;;   BRAIN BIOPSY     [phone]   BREAST SURGERY Right 12/2007   central duct excision   CHOLECYSTECTOMY N/A 12/08/2019   Procedure: LAPAROSCOPIC CHOLECYSTECTOMY;  Surgeon: Rubin Calamity, MD;  Location: [facility] OR;  Service: General;  Laterality: N/A;   COLONOSCOPY  2009   COLONOSCOPY  09/14/2020   COLONOSCOPY W/ BIOPSIES AND POLYPECTOMY  09/2007   Dr. Avram. 2 small adenomatous polyps, internal hemorrhoids-Grade 1. Rec  routine colonoscopy at age 89.   COLONOSCOPY W/ BIOPSIES AND POLYPECTOMY     ESOPHAGOGASTRODUODENOSCOPY (EGD) WITH PROPOFOL  N/A 08/02/2022   Procedure: ESOPHAGOGASTRODUODENOSCOPY (EGD) WITH PROPOFOL ;  Surgeon: Wilhelmenia Aloha Raddle., MD;  Location: [facility];  Service: Gastroenterology;  Laterality: N/A;   ESOPHAGOGASTRODUODENOSCOPY (EGD) WITH PROPOFOL  N/A 04/17/2023   Procedure: ESOPHAGOGASTRODUODENOSCOPY (EGD) WITH PROPOFOL ;  Surgeon: Wilhelmenia Aloha Raddle., MD;  Location: [facility];  Service: Gastroenterology;  Laterality: N/A;   EUS N/A 08/02/2022   Procedure: FULL UPPER ENDOSCOPIC ULTRASOUND (EUS) RADIAL;  Surgeon: Wilhelmenia Aloha Raddle., MD;  Location: [facility];  Service: Gastroenterology;  Laterality: N/A;   EUS N/A 04/17/2023   Procedure: UPPER ENDOSCOPIC ULTRASOUND (EUS) RADIAL;  Surgeon: Wilhelmenia Aloha Raddle., MD;  Location: [facility];  Service: Gastroenterology;  Laterality: N/A;   FIDUCIAL MARKER PLACEMENT N/A 04/17/2023   Procedure: FIDUCIAL MARKER PLACEMENT;  Surgeon: Wilhelmenia Aloha Raddle., MD;  Location: [facility];  Service:  Gastroenterology;  Laterality: N/A;   FINE NEEDLE ASPIRATION N/A 08/02/2022   Procedure: FINE NEEDLE ASPIRATION (FNA) LINEAR;  Surgeon: Wilhelmenia Aloha Raddle., MD;  Location: [facility];  Service: Gastroenterology;  Laterality: N/A;   IR IMAGING GUIDED PORT INSERTION  08/03/2022   NASAL SEPTOPLASTY W/ TURBINOPLASTY Bilateral 04/2008   Septoplasty with bilateral inferior turbinate reduction; Dr. Ethyl   POLYPECTOMY  08/02/2022   Procedure: POLYPECTOMY;  Surgeon: Wilhelmenia Aloha Raddle., MD;  Location: [facility];  Service: Gastroenterology;;   SHOULDER OPEN ROTATOR CUFF REPAIR Right     after hurting it by moving heavy objecs     Current Facility-Administered Medications  Medication Dose Route Frequency Provider Last Rate Last Admin   0.9 %  sodium chloride  infusion   Intravenous Continuous Avram Lupita BRAVO, MD       ampicillin -sulbactam (UNASYN ) 1.5 g in sodium chloride  0.9 % 100 mL IVPB  1.5 g Intravenous Once Avram Lupita BRAVO, MD       diclofenac  suppository 100 mg  100 mg Rectal Once Avram Lupita BRAVO, MD        Allergies as  of 09/09/2024 - Review Complete 09/09/2024  Allergen Reaction Noted   Iohexol  Anaphylaxis, Hives, and Other (See Comments) 08/13/2007    Family History  Problem Relation Age of Onset   Heart failure Mother    Diabetes Mother    Lung cancer Father        dx 59s-80s   Breast cancer Maternal Aunt        d. 64   Pancreatic cancer Maternal Aunt        d. 85   Prostate cancer Maternal Uncle        d. 41   Pancreatic cancer Maternal Grandfather        d. 61s   Breast cancer Cousin        dx 37s; maternal female cousin   Stomach cancer Cousin        d. 41; maternal female cousin   Pancreatic cancer Cousin        d. 45s; maternal female cousin   Colon cancer Neg Hx    Colon polyps Neg Hx    Esophageal cancer Neg Hx    Rectal cancer Neg Hx     Social History   Socioeconomic History   Marital status: Legally Separated    Spouse name: Not on file    Number of children: Not on file   Years of education: Not on file   Highest education level: Not on file  Occupational History   Occupation: unemployed - applied for disability  Tobacco Use   Smoking status: Every Day    Current packs/day: 1.50    Average packs/day: 1.5 packs/day for 36.0 years (54.0 ttl pk-yrs)    Types: Cigarettes   Smokeless tobacco: Never  Vaping Use   Vaping status: Former  Substance and Sexual Activity   Alcohol use: No    Alcohol/week: 0.0 standard drinks of alcohol   Drug use: No   Sexual activity: Yes    Partners: Female  Other Topics Concern   Not on file  Social History Narrative   Domestic Partner: female has emphysema   Smoked 2 packs/day for 20+ years, down to 1/2ppd now, willing to try to quit    Alcohol use-no   Drug use-no   Regular exercise-no   Social Drivers of Health   Tobacco Use: High Risk (09/11/2024)   Patient History    Smoking Tobacco Use: Every Day    Smokeless Tobacco Use: Never    Passive Exposure: Not on file  Financial Resource Strain: Low Risk (02/11/2024)   Received from [facility]   Overall Financial Resource Strain (CARDIA)    Difficulty of Paying Living Expenses: Not very hard  Food Insecurity: No Food Insecurity (08/26/2024)   Epic    Worried About Radiation Protection Practitioner of Food in the Last Year: Never true    Ran Out of Food in the Last Year: Never true  Transportation Needs: No Transportation Needs (08/26/2024)   Epic    Lack of Transportation (Medical): No    Lack of Transportation (Non-Medical): No  Physical Activity: Not on file  Stress: Not on file  Social Connections: Not on file  Intimate Partner Violence: Not At Risk (08/26/2024)   Epic    Fear of Current or Ex-Partner: No    Emotionally Abused: No    Physically Abused: No    Sexually Abused: No  Depression (PHQ2-9): Low Risk (09/09/2024)   Depression (PHQ2-9)    PHQ-2 Score: 0  Alcohol Screen: Not on file  Housing: Low  Risk (08/26/2024)   Epic     Unable to Pay for Housing in the Last Year: No    Number of Times Moved in the Last Year: 0    Homeless in the Last Year: No  Utilities: Not At Risk (08/26/2024)   Epic    Threatened with loss of utilities: No  Health Literacy: Not on file    Review of Systems:  All other review of systems negative except as mentioned in the HPI.  Physical Exam: Vital signs LMP 02/07/2016   General:   Alert, pleasant and cooperative in NAD there is alopecia Eyes:       icteric Lungs:  Clear throughout to auscultation.   Heart:  Regular rate and rhythm; no murmurs, clicks, rubs,  or gallops. Abdomen:  Soft, mildly tender epigastrium without mass, and nondistended. Normal bowel sounds.   Neuro/Psych:  Alert and cooperative. Normal mood and affect. A and O x 3   @Trinna Kunst  CHARLENA Commander, MD, [facility] [phone] (pager) 09/11/2024 10:06 AM@

## 2024-09-11 NOTE — Transfer of Care (Signed)
 Immediate Anesthesia Transfer of Care Note  Patient: Kathleen Stone  Procedure(s) Performed: ERCP, WITH INTERVENTION IF INDICATED EGD (ESOPHAGOGASTRODUODENOSCOPY)  Patient Location: PACU and Endoscopy Unit  Anesthesia Type:General  Level of Consciousness: awake, alert , oriented, drowsy, and patient cooperative  Airway & Oxygen Therapy: Patient Spontanous Breathing and Patient connected to face mask oxygen  Post-op Assessment: Report given to RN and Post -op Vital signs reviewed and stable  Post vital signs: Reviewed and stable  Last Vitals:  Vitals Value Taken Time  BP 186/87 09/11/24 13:15  Temp    Pulse 63 09/11/24 13:17  Resp 17 09/11/24 13:17  SpO2 100 % 09/11/24 13:17  Vitals shown include unfiled device data.  Last Pain:  Vitals:   09/11/24 1030  TempSrc: Temporal  PainSc: 8       Patients Stated Pain Goal: 8 (09/11/24 1030)  Complications: No notable events documented.

## 2024-09-11 NOTE — Op Note (Signed)
 [facility] Patient Name: Kathleen Stone Procedure Date: 09/11/2024 MRN: [id number] Attending MD: Lupita FORBES Commander , MD, [id number] Date of Birth: 1966/08/08 CSN: [id number] Age: 58 Admit Type: Outpatient Procedure:                ERCP Indications:              Jaundice, Malignant tumor of the head of pancreas -                            obstructed bile duct Providers:                Lupita CHARLENA Commander, MD, Gregoria Pierce, RN, Curtistine Bishop, Technician Referring MD:              Medicines:                Unasyn  1.5 g IV and diclofenac  100 mg per rectum Complications:            No immediate complications. Estimated Blood Loss:     Estimated blood loss: none. Procedure:                Pre-Anesthesia Assessment:                           - Prior to the procedure, a History and Physical                            was performed, and patient medications and                            allergies were reviewed. The patient's tolerance of                            previous anesthesia was also reviewed. The risks                            and benefits of the procedure and the sedation                            options and risks were discussed with the patient.                            All questions were answered, and informed consent                            was obtained. Prior Anticoagulants: The patient has                            taken no anticoagulant or antiplatelet agents. ASA                            Grade Assessment: III - A patient with severe  systemic disease. After reviewing the risks and                            benefits, the patient was deemed in satisfactory                            condition to undergo the procedure.                           After obtaining informed consent, the scope was                            passed under direct vision. Throughout the                             procedure, the patient's blood pressure, pulse, and                            oxygen saturations were monitored continuously. Due                            to retained food seen on MRI a gastroscopy was                            performed first. The GIF-H190 ([id number] Olympus                            endoscope was introduced through the mouth, into                            the esophagus stomach and duodenum and was normal.                            Retroflex also. The Exalt duodenoscope was then                            used to perform the ERCP. The papilla looked                            normal. There was no bile draining. Using the Rx 44                            sphincterotome with 035 wire it was inserted into                            the pancreatic duct with ease but I was not                            confident I was in the bile duct. I tried the                            double wire approach and I had a wire in the  pancreatic duct and the bile duct but the bile duct                            wire was lost after placing a pancreatic duct stent                            which was inadvertently placed into the bile duct.                            Additional attempts using the Rx 39 sphincterotome                            with 025 wire allowed cannulation of the suspected                            pancreatic duct and I elected to place a pancreatic                            duct stent again, a 4 French 3 cm single pigtail no                            flap stent. Further attempts to try to cannulate                            the bile duct were unsuccessful using the Rx 39                            sphincterotome, the revolution sphincterotome both                            with 025 wires and with the Rx 44 sphincterotome                            again and 035 wire. The pancreatic duct stent was                            left in  place. Scope In: Scope Out: Findings:      As above. Contrast was not injected. Impression:               - One pancreatic stent was placed into suspected                            ventral pancreatic duct. No pancreatogram or                            cholangiogram obtained. Moderate Sedation:      Not Applicable - Patient had care per Anesthesia. Recommendation:           Observe the patient, after reviewing the images and                            other cross-sectional imaging I think it is  possible she has altered anatomy and distorted and                            that the second pancreatic stent may be in the bile                            duct.                           Have discussed with patient and family. Will try                            repeat ERCP tomorrow. Asked hospitalist for                            observation admission. Would administer steroid                            prep for history of IV contrast allergy. Though                            very unlikely bile duct contrast would produce a                            reaction she has had a reaction after a steroid                            prep when having a CT scan so we will do so.                           Prep would be 50 mg prednisone  p.o. 13 hours                            (1830PM today) before 7 hours before (0030                            tomorrow) and 40 mg Solu-Medrol  IV 1 hour before                            the procedure and diphenhydramine  50 mg IV 1 hour                            before procedure ( 0630 tomorrow). Procedure Code(s):        --- Professional ---                           [phone], Endoscopic retrograde                            cholangiopancreatography (ERCP); with placement of                            endoscopic stent into biliary or pancreatic duct,  including pre- and post-dilation and guide wire                             passage, when performed, including sphincterotomy,                            when performed, each stent Diagnosis Code(s):        --- Professional ---                           R17, Unspecified jaundice                           C25.0, Malignant neoplasm of head of pancreas CPT copyright 2022 American Medical Association. All rights reserved. The codes documented in this report are preliminary and upon coder review may  be revised to meet current compliance requirements. Lupita FORBES Commander, MD 09/11/2024 1:48:58 PM This report has been signed electronically. Number of Addenda: 0

## 2024-09-11 NOTE — Plan of Care (Signed)
" °  Problem: Education: Goal: Knowledge of General Education information will improve Description: Including pain rating scale, medication(s)/side effects and non-pharmacologic comfort measures Outcome: Progressing   Problem: Health Behavior/Discharge Planning: Goal: Ability to manage health-related needs will improve Outcome: Progressing   Problem: Clinical Measurements: Goal: Ability to maintain clinical measurements within normal limits will improve Outcome: Progressing Goal: Respiratory complications will improve Outcome: Progressing   Problem: Coping: Goal: Level of anxiety will decrease Outcome: Progressing   Problem: Pain Managment: Goal: General experience of comfort will improve and/or be controlled Outcome: Progressing   "

## 2024-09-11 NOTE — Progress Notes (Signed)
"      ° °  [name] is a 58 year old woman with pancreatic cancer and new obstructive jaundice in the setting of ongoing treatment.  Attempted ERCP today was unsuccessful but would like to try again tomorrow after reviewing my images and other images.  Patient is resting comfortably in recovery.  I will follow-up on her again later today and make sure we have all the orders.  Please see the notes she does have a history of contrast allergy and will need a steroid protocol as outlined in the ERCP note.  Clear liquids today n.p.o. after midnight.  Does not need any antibiotics at this point.  Discussed with Dr. Celinda of [facility] hospitalist.  Thanks Lupita CHARLENA Commander, MD, [facility]  "

## 2024-09-12 ENCOUNTER — Observation Stay (HOSPITAL_COMMUNITY)

## 2024-09-12 ENCOUNTER — Observation Stay (HOSPITAL_COMMUNITY): Admitting: Certified Registered Nurse Anesthetist

## 2024-09-12 ENCOUNTER — Encounter (HOSPITAL_COMMUNITY)
Admission: RE | Disposition: A | Discharge status: Home / Self Care | Payer: Self-pay | Source: Home / Self Care | Attending: Internal Medicine

## 2024-09-12 ENCOUNTER — Encounter (HOSPITAL_COMMUNITY): Payer: Self-pay

## 2024-09-12 DIAGNOSIS — E785 Hyperlipidemia, unspecified: Secondary | ICD-10-CM

## 2024-09-12 DIAGNOSIS — F32A Depression, unspecified: Secondary | ICD-10-CM

## 2024-09-12 DIAGNOSIS — K831 Obstruction of bile duct: Secondary | ICD-10-CM

## 2024-09-12 DIAGNOSIS — F418 Other specified anxiety disorders: Secondary | ICD-10-CM | POA: Diagnosis not present

## 2024-09-12 DIAGNOSIS — E441 Mild protein-calorie malnutrition: Secondary | ICD-10-CM

## 2024-09-12 DIAGNOSIS — I10 Essential (primary) hypertension: Secondary | ICD-10-CM

## 2024-09-12 DIAGNOSIS — E876 Hypokalemia: Secondary | ICD-10-CM | POA: Diagnosis not present

## 2024-09-12 DIAGNOSIS — F419 Anxiety disorder, unspecified: Secondary | ICD-10-CM | POA: Diagnosis not present

## 2024-09-12 DIAGNOSIS — C250 Malignant neoplasm of head of pancreas: Secondary | ICD-10-CM

## 2024-09-12 DIAGNOSIS — F17210 Nicotine dependence, cigarettes, uncomplicated: Secondary | ICD-10-CM | POA: Diagnosis not present

## 2024-09-12 HISTORY — PX: BILIARY STENT PLACEMENT: SHX5538

## 2024-09-12 HISTORY — PX: ERCP: SHX5425

## 2024-09-12 LAB — COMPREHENSIVE METABOLIC PANEL WITH GFR
ALT: 81 U/L — ABNORMAL HIGH (ref 0–44)
AST: 97 U/L — ABNORMAL HIGH (ref 15–41)
Albumin: 3.4 g/dL — ABNORMAL LOW (ref 3.5–5.0)
Alkaline Phosphatase: 360 U/L — ABNORMAL HIGH (ref 38–126)
Anion gap: 8 (ref 5–15)
BUN: 8 mg/dL (ref 6–20)
CO2: 30 mmol/L (ref 22–32)
Calcium: 9.8 mg/dL (ref 8.9–10.3)
Chloride: 105 mmol/L (ref 98–111)
Creatinine, Ser: 0.78 mg/dL (ref 0.44–1.00)
GFR, Estimated: >60 mL/min
Glucose, Bld: 86 mg/dL (ref 70–99)
Potassium: 3.8 mmol/L (ref 3.5–5.1)
Sodium: 143 mmol/L (ref 135–145)
Total Bilirubin: 5.4 mg/dL — ABNORMAL HIGH (ref 0.0–1.2)
Total Protein: 6.7 g/dL (ref 6.5–8.1)

## 2024-09-12 LAB — CBC
HCT: 33.8 % — ABNORMAL LOW (ref 36.0–46.0)
Hemoglobin: 11 g/dL — ABNORMAL LOW (ref 12.0–15.0)
MCH: 29.6 pg (ref 26.0–34.0)
MCHC: 32.5 g/dL (ref 30.0–36.0)
MCV: 91.1 fL (ref 80.0–100.0)
Platelets: 217 K/uL (ref 150–400)
RBC: 3.71 MIL/uL — ABNORMAL LOW (ref 3.87–5.11)
RDW: 18.8 % — ABNORMAL HIGH (ref 11.5–15.5)
WBC: 8.6 K/uL (ref 4.0–10.5)
nRBC: 0 % (ref 0.0–0.2)

## 2024-09-12 LAB — HIV ANTIBODY (ROUTINE TESTING W REFLEX): HIV Screen 4th Generation wRfx: NONREACTIVE

## 2024-09-12 MED ORDER — FENTANYL CITRATE (PF) 100 MCG/2ML IJ SOLN
INTRAMUSCULAR | Status: AC
  Filled 2024-09-12: qty 2

## 2024-09-12 MED ORDER — FENTANYL CITRATE (PF) 100 MCG/2ML IJ SOLN: 25.0000 ug | INTRAMUSCULAR | Status: DC | PRN

## 2024-09-12 MED ORDER — ROCURONIUM BROMIDE 10 MG/ML (PF) SYRINGE
PREFILLED_SYRINGE | INTRAVENOUS | Status: DC | PRN
  Administered 2024-09-12: 50 mg via INTRAVENOUS

## 2024-09-12 MED ORDER — FENTANYL CITRATE (PF) 250 MCG/5ML IJ SOLN
INTRAMUSCULAR | Status: DC | PRN
  Administered 2024-09-12 (×2): 50 ug via INTRAVENOUS

## 2024-09-12 MED ORDER — DIPHENHYDRAMINE HCL 50 MG/ML IJ SOLN
INTRAMUSCULAR | Status: AC
  Filled 2024-09-12: qty 1

## 2024-09-12 MED ORDER — CIPROFLOXACIN IN D5W 400 MG/200ML IV SOLN
INTRAVENOUS | Status: DC | PRN
  Administered 2024-09-12: 400 mg via INTRAVENOUS

## 2024-09-12 MED ORDER — DIPHENHYDRAMINE HCL 50 MG/ML IJ SOLN
25.0000 mg | Freq: Once | INTRAMUSCULAR | Status: AC
  Administered 2024-09-12: 25 mg via INTRAVENOUS

## 2024-09-12 MED ORDER — PROPOFOL 10 MG/ML IV BOLUS
INTRAVENOUS | Status: DC | PRN
  Administered 2024-09-12: 100 mg via INTRAVENOUS

## 2024-09-12 MED ORDER — GLUCAGON HCL RDNA (DIAGNOSTIC) 1 MG IJ SOLR
INTRAMUSCULAR | Status: DC | PRN
  Administered 2024-09-12 (×2): .5 mg via INTRAVENOUS

## 2024-09-12 MED ORDER — HEPARIN SOD (PORK) LOCK FLUSH 100 UNIT/ML IV SOLN
500.0000 [IU] | INTRAVENOUS | Status: AC | PRN
  Administered 2024-09-12: 500 [IU]
  Filled 2024-09-12: qty 5

## 2024-09-12 MED ORDER — SODIUM CHLORIDE 0.9 % IV SOLN
INTRAVENOUS | Status: DC | PRN
  Administered 2024-09-12: 20 mL

## 2024-09-12 MED ORDER — DROPERIDOL 2.5 MG/ML IJ SOLN: 0.6250 mg | Freq: Once | INTRAMUSCULAR | Status: DC | PRN

## 2024-09-12 MED ORDER — DICLOFENAC SUPPOSITORY 100 MG
RECTAL | Status: DC | PRN
  Administered 2024-09-12: 100 mg via RECTAL

## 2024-09-12 MED ORDER — ONDANSETRON HCL 4 MG/2ML IJ SOLN
INTRAMUSCULAR | Status: DC | PRN
  Administered 2024-09-12: 4 mg via INTRAVENOUS

## 2024-09-12 MED ORDER — SUGAMMADEX SODIUM 200 MG/2ML IV SOLN
INTRAVENOUS | Status: DC | PRN
  Administered 2024-09-12: 200 mg via INTRAVENOUS

## 2024-09-12 MED ORDER — LIDOCAINE 2% (20 MG/ML) 5 ML SYRINGE
INTRAMUSCULAR | Status: DC | PRN
  Administered 2024-09-12: 70 mg via INTRAVENOUS

## 2024-09-12 MED ORDER — SODIUM CHLORIDE 0.9 % IV SOLN: INTRAVENOUS | Status: DC

## 2024-09-12 MED ORDER — DICLOFENAC SUPPOSITORY 100 MG
RECTAL | Status: AC
  Filled 2024-09-12: qty 1

## 2024-09-12 MED ORDER — CIPROFLOXACIN IN D5W 400 MG/200ML IV SOLN
INTRAVENOUS | Status: AC
  Filled 2024-09-12: qty 200

## 2024-09-12 MED ORDER — DEXAMETHASONE SOD PHOSPHATE PF 10 MG/ML IJ SOLN
INTRAMUSCULAR | Status: DC | PRN
  Administered 2024-09-12: 10 mg via INTRAVENOUS

## 2024-09-12 MED ORDER — GLUCAGON HCL RDNA (DIAGNOSTIC) 1 MG IJ SOLR
INTRAMUSCULAR | Status: AC
  Filled 2024-09-12: qty 1

## 2024-09-12 NOTE — Anesthesia Preprocedure Evaluation (Signed)
"                                    Anesthesia Evaluation    Reviewed: Allergy & Precautions, H&P , Patient's Chart, lab work & pertinent test results, Unable to perform ROS - Chart review only  History of Anesthesia Complications Negative for: history of anesthetic complications  Airway Mallampati: II  TM Distance: >3 FB Neck ROM: Full    Dental no notable dental hx.    Pulmonary neg COPD, Current Smoker   Pulmonary exam normal breath sounds clear to auscultation       Cardiovascular hypertension, (-) angina (-) Past MI Normal cardiovascular exam+ Valvular Problems/Murmurs MR  Rhythm:Regular Rate:Normal  Echo 02/2022 1. Left ventricular ejection fraction, by estimation, is 65 to 70%. The  left ventricle has normal function. The left ventricle has no regional  wall motion abnormalities. There is mild concentric left ventricular  hypertrophy. Left ventricular diastolic parameters are consistent  with Grade II diastolic dysfunction (pseudonormalization).   2. Right ventricular systolic function is normal. The right ventricular  size is normal. Tricuspid regurgitation signal is inadequate for assessing  PA pressure.   3. Left atrial size was moderately dilated.   4. The mitral valve is normal in structure. Mild mitral valve  regurgitation. No evidence of mitral stenosis.   5. The aortic valve is normal in structure. Aortic valve regurgitation is  not visualized. No aortic stenosis is present.   6. The inferior vena cava is normal in size with greater than 50%  respiratory variability, suggesting right atrial pressure of 3 mmHg.     Neuro/Psych  Headaches PSYCHIATRIC DISORDERS Anxiety Depression    Hx of intracranial tumor  Neuromuscular disease CVA    GI/Hepatic ,neg GERD  ,,Biliary obstruction Pancreatic cancer    Endo/Other  negative endocrine ROS    Renal/GU negative Renal ROS     Musculoskeletal  (+) Arthritis ,    Abdominal   Peds   Hematology negative hematology ROS (+)   Anesthesia Other Findings   Reproductive/Obstetrics                              Anesthesia Physical Anesthesia Plan  ASA: 3  Anesthesia Plan: General   Post-op Pain Management: Minimal or no pain anticipated   Induction: Intravenous  PONV Risk Score and Plan: 2 and Ondansetron  and Dexamethasone   Airway Management Planned: Oral ETT  Additional Equipment: None  Intra-op Plan:   Post-operative Plan: Extubation in OR  Informed Consent: I have reviewed the patients History and Physical, chart, labs and discussed the procedure including the risks, benefits and alternatives for the proposed anesthesia with the patient or authorized representative who has indicated his/her understanding and acceptance.     Dental advisory given  Plan Discussed with: CRNA  Anesthesia Plan Comments:          Anesthesia Quick Evaluation  "

## 2024-09-12 NOTE — Anesthesia Postprocedure Evaluation (Signed)
"   Anesthesia Post Note  Patient: Kathleen Stone  Procedure(s) Performed: ERCP, WITH INTERVENTION IF INDICATED INSERTION, STENT, BILE DUCT     Patient location during evaluation: PACU Anesthesia Type: General Level of consciousness: sedated and patient cooperative Pain management: pain level controlled Vital Signs Assessment: post-procedure vital signs reviewed and stable Respiratory status: spontaneous breathing Cardiovascular status: stable Anesthetic complications: no   No notable events documented.  Last Vitals:  Vitals:   09/12/24 0935 09/12/24 0944  BP: (!) 166/102 (!) 166/102  Pulse: (!) 56   Resp: 18   Temp: (!) 36.3 C   SpO2: 100%     Last Pain:  Vitals:   09/12/24 0935  TempSrc: Oral  PainSc:                  [name]      "

## 2024-09-12 NOTE — Plan of Care (Signed)

## 2024-09-12 NOTE — Plan of Care (Signed)
  Problem: Education: Goal: Knowledge of General Education information will improve Description: Including pain rating scale, medication(s)/side effects and non-pharmacologic comfort measures Outcome: Progressing   Problem: Clinical Measurements: Goal: Ability to maintain clinical measurements within normal limits will improve Outcome: Progressing Goal: Will remain free from infection Outcome: Progressing Goal: Diagnostic test results will improve Outcome: Progressing Goal: Respiratory complications will improve Outcome: Progressing Goal: Cardiovascular complication will be avoided Outcome: Progressing   Problem: Nutrition: Goal: Adequate nutrition will be maintained Outcome: Progressing   Problem: Coping: Goal: Level of anxiety will decrease Outcome: Progressing   Problem: Elimination: Goal: Will not experience complications related to bowel motility Outcome: Progressing Goal: Will not experience complications related to urinary retention Outcome: Progressing   Problem: Safety: Goal: Ability to remain free from injury will improve Outcome: Progressing

## 2024-09-12 NOTE — Anesthesia Procedure Notes (Signed)
 Procedure Name: Intubation Date/Time: 09/12/2024 7:44 AM  Performed by: Cena Epps, CRNAPre-anesthesia Checklist: Patient identified, Emergency Drugs available, Suction available and Patient being monitored Patient Re-evaluated:Patient Re-evaluated prior to induction Oxygen Delivery Method: Circle System Utilized Preoxygenation: Pre-oxygenation with 100% oxygen Induction Type: IV induction Ventilation: Mask ventilation without difficulty Laryngoscope Size: Mac and 3 Grade View: Grade I Tube type: Oral Tube size: 7.0 mm Number of attempts: 1 Airway Equipment and Method: Stylet and Oral airway Placement Confirmation: ETT inserted through vocal cords under direct vision, positive ETCO2 and breath sounds checked- equal and bilateral Secured at: 22 cm Tube secured with: Tape Dental Injury: Teeth and Oropharynx as per pre-operative assessment

## 2024-09-12 NOTE — Op Note (Signed)
 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 Cloretta Medicines:                General Anesthesia, Cipro  400 mg IV, Diclofenac  100                            mg per rectum, premedicated with prednisone ,                            Solu-Medrol  and IV Benadryl  due to contrast                            allergy, glucagon  1 mg IV Complications:            No immediate complications. Estimated Blood Loss:     Estimated blood loss: none. Procedure:                Pre-Anesthesia Assessment:                           - Prior to the procedure, a History and Physical                            was performed, and patient medications and                            allergies were reviewed. The patient's tolerance of                            previous anesthesia was also reviewed. The risks                            and benefits of the procedure and the sedation                            options and risks were discussed with the patient.                            All questions were answered, and informed consent                            was obtained. Prior Anticoagulants: The patient has  taken no anticoagulant or antiplatelet agents. ASA                            Grade Assessment: III - A patient with severe                            systemic disease. After reviewing the risks and                            benefits, the patient was deemed in satisfactory                            condition to undergo the procedure.                            After obtaining informed consent, the scope was                            passed under direct vision. Throughout the                            procedure, the patient's blood pressure, pulse, and                            oxygen saturations were monitored continuously. The                            [id number] ([id number] Olympus duodenoscope was                            introduced through the mouth, and used to inject                            contrast into and used to inject contrast into the                            bile duct and ventral pancreatic duct. The ERCP was                            somewhat difficult due to challenging cannulation                            and spasm. Successful completion of the procedure                            was aided by Double wire technique and the use of                            glucagon . Scope In: Scope Out: Findings:      The esophagus was not seen well. The stomach was grossly normal and in       the duodenum there was a pancreatic duct stent seen at the papilla was       slightly traumatized from yesterday's procedure.  The pancreatic duct       stent was removed with a snare. The Rx 44 sphincterotome with 0.035 wire       was used to cannulate. Initially it went into the pancreatic duct       confirmed by contrast injection. Air bubbles were seen. The duct       terminated in the region of the fiducials and known cancer. I was unable       to successfully cannulate the bile duct on further attempts so I left a       wire in the pancreatic duct and then I was able to cannulate the bile       duct using a second 0.035 wire. Contrast was injected. In the mid to       common bile duct there was a sharp angulated stricture. The mid and       distal common bile duct was pulled towards the midline and was in close       association with the pancreatic duct in the region of the tumor where if       I do shells were seen. The distal bile duct  was somewhat dilated but       there was greater dilation proximal to the stricture at about 12 mm       maximal in the common hepatic duct and the intrahepatic ducts were       dilated as well, these were not filled out all that well. I elected to       remove the pancreatic duct wire at this point. I then performed a       biliary sphincterotomy approximately 4 to 5 mm in size, there was some       increased flow of contrast and bile. I then placed a 10 x 60 mm       uncovered self-expanding metal stent across the stricture and protruding       from the papilla. Initially attempted a 10 x 80 mm stent but determined       this was too long so it was not placed. I personally interpreted the       images. 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 The                            stricture was malignant appearing. This stricture                            was treated with stent placement. Moderate Sedation:      Not Applicable - Patient had care per Anesthesia. Recommendation:           - Clear liquids for now.                           Return to floor.                           I will check on her later today she may be  able to                            go home today versus tomorrow. Procedure Code(s):        --- Professional ---                           [phone], Endoscopic retrograde                            cholangiopancreatography (ERCP); with placement of                            endoscopic stent into biliary or pancreatic duct,                            including pre- and post-dilation and guide wire                            passage, when performed, including sphincterotomy,                            when performed, each stent                           [phone], Combined endoscopic catheterization of the                            biliary and pancreatic ductal systems,  radiological                            supervision and interpretation Diagnosis Code(s):        --- Professional ---                           K83.1, Obstruction of bile duct                           R17, Unspecified jaundice                           C25.1, Malignant neoplasm of body of pancreas CPT copyright 2022 [employer]. All rights reserved. The codes documented in this report are preliminary and upon coder review may  be revised to meet current compliance requirements. Lupita FORBES Commander, MD 09/12/2024 8:44:48 AM This report has been signed electronically. Number of Addenda: 0

## 2024-09-12 NOTE — Progress Notes (Addendum)
"      ° ° °  She tolerated solid food at lunch. No pain Abd NT  BP (!) 155/73 (BP Location: Left Arm)   Pulse 64   Temp 98.8 F (37.1 C) (Oral)   Resp 18   Ht 5' 6 (1.676 m)   Wt 76.2 kg   LMP 02/07/2016   SpO2 100%   BMI 27.12 kg/m    OK for DC now  Her Eliquis  is on long-term vs dc  hold per oncology  She will f/u in oncology and GI PRN  Lupita CHARLENA Commander, MD, [facility] See TRACEY on call - gastroenterology for best contact person 09/12/2024 1:49 PM  "

## 2024-09-12 NOTE — Interval H&P Note (Signed)
 History and Physical Interval Note:  09/12/2024 7:28 AM  Kathleen Stone  has presented today for surgery, with the diagnosis of obstructive jaundice.  The various methods of treatment have been discussed with the patient and family. After consideration of risks, benefits and other options for treatment, the patient has consented to  Procedures: ERCP, WITH INTERVENTION IF INDICATED (N/A) as a surgical intervention.  The patient's history has been reviewed, patient examined, no change in status, stable for surgery.  I have reviewed the patient's chart and labs.  Questions were answered to the patient's satisfaction.     [name]

## 2024-09-12 NOTE — Transfer of Care (Signed)
 Immediate Anesthesia Transfer of Care Note  Patient: Kathleen Stone  Procedure(s) Performed: ERCP, WITH INTERVENTION IF INDICATED INSERTION, STENT, BILE DUCT  Patient Location: PACU  Anesthesia Type:General  Level of Consciousness: drowsy  Airway & Oxygen Therapy: Patient Spontanous Breathing and Patient connected to face mask oxygen  Post-op Assessment: Report given to RN and Post -op Vital signs reviewed and stable  Post vital signs: Reviewed and stable  Last Vitals:  Vitals Value Taken Time  BP    Temp 36.2 C 09/12/24 08:45  Pulse 58 09/12/24 08:51  Resp 12 09/12/24 08:51  SpO2 100 % 09/12/24 08:51  Vitals shown include unfiled device data.  Last Pain:  Vitals:   09/12/24 0704  TempSrc: Temporal  PainSc: 0-No pain      Patients Stated Pain Goal: 8 (09/11/24 1030)  Complications: No notable events documented.

## 2024-09-13 ENCOUNTER — Encounter (HOSPITAL_COMMUNITY): Payer: Self-pay | Admitting: Internal Medicine

## 2024-09-14 ENCOUNTER — Other Ambulatory Visit: Payer: Self-pay | Admitting: Nurse Practitioner

## 2024-09-14 DIAGNOSIS — C251 Malignant neoplasm of body of pancreas: Secondary | ICD-10-CM

## 2024-09-15 NOTE — Hospital Course (Addendum)
 HPI per Dr. Alm Castor:  Kathleen Stone is a 58 y.o. female with medical history significant of abnormal stress test, benign brain tumor, DJD of cervical spine, bilateral galactorrhea, microscopic hematuria, hyperlipidemia, hypertension, hypokalemia, nausea and emesis, history of CVA, tobacco abuse history of pancreatic cancer who was undergoing an ERCP earlier today with Dr. Avram that was unsuccessful, but after reviewing imaging that she has been scheduled for a retrial of the procedure tomorrow.  She denied fever, chills, rhinorrhea, sore throat, wheezing or hemoptysis.  No chest pain, palpitations, diaphoresis, PND, orthopnea or pitting edema of the lower extremities.  Her stools have been acholic, no constipation, melena or hematochezia.  No flank pain, dysuria, frequency or hematuria.  No polyuria, polydipsia, polyphagia or blurred vision.    Lab work: CBC showed white count 6.6, hemoglobin 12.8 g/dL platelets 770.  Normal PT and INR.  CMP/chemistry magnesium  was 1.9, glucose 104 and total bilirubin 8.4 mg/dL.  AST 809, ALT 88 and alkaline phosphatase 404 units/L.  Potassium is 3.1 mmol/L with the rest of the electrolytes, albumin, total protein and renal function within normal range.  **Interim History:   GI was consulted and took the patient for an ERCP and she is found to have a single localized biliary stricture in the upper third of the main bile duct and was malignant appearing and treated with stent placement.  Diet was advanced to clears and then subsequently to soft and she tolerated this without issues.  She improved and GI felt that she was stable for discharge.  She will need to follow-up with PCP, medical oncology as well as GI in outpatient setting  Assessment and Plan:  Obstructive jaundice (HCC) w/ abnormal LFTS, Hyperbilirubinemia: Observation/telemetry. Clear liquid diet. Keep n.p.o. after midnight. Analgesics as needed. Antiemetics as needed. Follow CBC, CMP in AM. GI  input appreciated and see above. LFT Trend:  Recent Labs  Lab 08/19/24 0946 09/02/24 0956 09/09/24 0950 09/09/24 0950 09/11/24 1634 09/12/24 0428  AST 88* 129* 96*  --  109* 97*  ALT 74* 153* 91*   < > 88* 81*  BILITOT 0.5 1.0 7.1*   < > 8.4* 5.4*  ALKPHOS 230* 308* 415*  --  404* 360*   < > = values in this interval not displayed.  -Improved and stable for D/C   Hypokalemia: Replaced. K+ went from 3.1 -> 3.8 Magnesium  was supplemented. -CTM    Mild protein malnutrition: In the setting of malignancy. May benefit from protein supplementation.   Essential hypertension Resume amlodipine  5 mg p.o. daily. May use oral or parenteral antihypertensives as needed.   Hyperlipidemia: Statin therapy contraindicated at this time. Follow-up with PCP as an outpatient.   Anxiety and depression: Stop taking mirtazapine . Will use while in the hospital.  Normocytic Anemia: Hgb/hct Trend:  Recent Labs  Lab 08/19/24 0946 09/02/24 0956 09/09/24 0950 09/11/24 1634 09/12/24 0428  HGB 10.6* 10.9* 12.2 12.8 11.0*  HCT 31.5* 33.5* 36.9 37.7 33.8*  MCV 90.5 91.5 90.4 89.5 91.1  -Check Anemia Panel in the outpatient setting. CTM for S/Sx of Bleeding; No overt bleeding noted. Repeat CBC w/in 1 week  Hypoalbuminemia: Patient's Albumin Trend: Recent Labs  Lab 08/19/24 0946 09/02/24 0956 09/09/24 0950 09/11/24 1634 09/12/24 0428  ALBUMIN 3.6 3.7 4.0 3.7 3.4*  -Continue to Monitor and Trend and repeat CMP in the AM  Overweight: Complicates overall prognosis and care. Estimated body mass index is 27.12 kg/m as calculated from the following:   Height as of  this encounter: 5' 6 (1.676 m).   Weight as of this encounter: 76.2 kg. Weight Loss and Dietary Counseling given

## 2024-09-15 NOTE — Discharge Summary (Signed)
 " Physician Discharge Summary   Patient: Kathleen Stone MRN: [id number] DOB: [birth year]  Admit date:     09/11/2024  Discharge date: 09/12/2024  Discharge Physician: Alejandro Marker, DO   PCP: Harrie Bruckner, DO   Recommendations at discharge:   Follow-up with PCP within 1 to 2 weeks and repeat CBC, CMP, mag, Phos within 1 week Follow-up with gastroenterology in outpatient setting within 1 to 2 weeks Follow-up with medical oncology in outpatient setting within 1 to 2 weeks  Discharge Diagnoses: Principal Problem:   Obstructive jaundice (HCC) Active Problems:   Essential hypertension   Hyperlipidemia   Anxiety and depression   Hypokalemia   Mild protein malnutrition   Bile duct stricture (HCC)  Resolved Problems:   * No resolved hospital problems. *  Hospital Course: HPI per Dr. Alm Castor:  [name] is a 58 y.o. female with medical history significant of abnormal stress test, benign brain tumor, DJD of cervical spine, bilateral galactorrhea, microscopic hematuria, hyperlipidemia, hypertension, hypokalemia, nausea and emesis, history of CVA, tobacco abuse history of pancreatic cancer who was undergoing an ERCP earlier today with Dr. Avram that was unsuccessful, but after reviewing imaging that she has been scheduled for a retrial of the procedure tomorrow.  She denied fever, chills, rhinorrhea, sore throat, wheezing or hemoptysis.  No chest pain, palpitations, diaphoresis, PND, orthopnea or pitting edema of the lower extremities.  Her stools have been acholic, no constipation, melena or hematochezia.  No flank pain, dysuria, frequency or hematuria.  No polyuria, polydipsia, polyphagia or blurred vision.    Lab work: CBC showed white count 6.6, hemoglobin 12.8 g/dL platelets 770.  Normal PT and INR.  CMP/chemistry magnesium  was 1.9, glucose 104 and total bilirubin 8.4 mg/dL.  AST 809, ALT 88 and alkaline phosphatase 404 units/L.  Potassium is 3.1 mmol/L with the rest of  the electrolytes, albumin, total protein and renal function within normal range.  **Interim History:   GI was consulted and took the patient for an ERCP and she is found to have a single localized biliary stricture in the upper third of the main bile duct and was malignant appearing and treated with stent placement.  Diet was advanced to clears and then subsequently to soft and she tolerated this without issues.  She improved and GI felt that she was stable for discharge.  She will need to follow-up with PCP, medical oncology as well as GI in outpatient setting  Assessment and Plan:  Obstructive jaundice (HCC) w/ abnormal LFTS, Hyperbilirubinemia: Observation/telemetry. Clear liquid diet. Keep n.p.o. after midnight. Analgesics as needed. Antiemetics as needed. Follow CBC, CMP in AM. GI input appreciated and see above. LFT Trend:  Recent Labs  Lab 08/19/24 0946 09/02/24 0956 09/09/24 0950 09/09/24 0950 09/11/24 1634 09/12/24 0428  AST 88* 129* 96*  --  109* 97*  ALT 74* 153* 91*   < > 88* 81*  BILITOT 0.5 1.0 7.1*   < > 8.4* 5.4*  ALKPHOS 230* 308* 415*  --  404* 360*   < > = values in this interval not displayed.  -Improved and stable for D/C   Hypokalemia: Replaced. K+ went from 3.1 -> 3.8 Magnesium  was supplemented. -CTM    Mild protein malnutrition: In the setting of malignancy. May benefit from protein supplementation.   Essential hypertension Resume amlodipine  5 mg p.o. daily. May use oral or parenteral antihypertensives as needed.   Hyperlipidemia: Statin therapy contraindicated at this time. Follow-up with PCP as an outpatient.  Anxiety and depression: Stop taking mirtazapine . Will use while in the hospital.  Normocytic Anemia: Hgb/hct Trend:  Recent Labs  Lab 08/19/24 0946 09/02/24 0956 09/09/24 0950 09/11/24 1634 09/12/24 0428  HGB 10.6* 10.9* 12.2 12.8 11.0*  HCT 31.5* 33.5* 36.9 37.7 33.8*  MCV 90.5 91.5 90.4 89.5 91.1  -Check Anemia Panel in the  outpatient setting. CTM for S/Sx of Bleeding; No overt bleeding noted. Repeat CBC w/in 1 week  Consultants: GI Procedures performed: ERCP  Disposition: Home Diet recommendation:  Discharge Diet Orders (From admission, onward)     Start     Ordered   09/12/24 0000  Diet general       Comments: SOFT DIET   09/12/24 1357           Cardiac diet DISCHARGE MEDICATION: Allergies as of 09/12/2024       Reactions   Iohexol  Anaphylaxis, Hives, Other (See Comments)   05/25/24 - Pt given contrast for CT after 13 hr prep, had reaction and needed care in ER per MD evaluation.         Medication List     PAUSE taking these medications    apixaban  5 MG Tabs tablet Wait to take this until your doctor or other care provider tells you to start again. Commonly known as: ELIQUIS  Take 1 tablet (5 mg total) by mouth 2 (two) times daily.       TAKE these medications    acetaminophen  325 MG tablet Commonly known as: TYLENOL  Take by mouth every 6 (six) hours as needed.   albuterol  108 (90 Base) MCG/ACT inhaler Commonly known as: VENTOLIN  HFA Inhale 2 puffs into the lungs every 6 (six) hours as needed for wheezing or shortness of breath.   amLODipine  5 MG tablet Commonly known as: NORVASC  Take 1 tablet (5 mg total) by mouth daily.   dexamethasone  4 MG tablet Commonly known as: DECADRON  Take 1 tablet (4 mg total) by mouth 2 (two) times daily. Take one tablet twice daily x 3 days after chemo. Start on 06/25/2024   EPINEPHrine  0.3 mg/0.3 mL Soaj injection Commonly known as: EPI-PEN Inject 0.3 mg into the muscle as needed for anaphylaxis.   lidocaine -prilocaine  cream Commonly known as: EMLA  Apply 1 Application topically as needed.   lipase/protease/amylase 63999 UNITS Cpep capsule Commonly known as: Creon  Take 2 capsules (72,000 Units total) by mouth 3 (three) times daily with meals.   loperamide 2 MG capsule Commonly known as: IMODIUM Take 2-4 mg by mouth as needed for  diarrhea or loose stools. Maximum 8/day   Melatonin 5 MG Caps Take 1 capsule (5 mg total) by mouth at bedtime.   mirtazapine  7.5 MG tablet Commonly known as: REMERON  Take 1 tablet (7.5 mg total) by mouth at bedtime.   nicotine  14 mg/24hr patch Commonly known as: NICODERM CQ  - dosed in mg/24 hours Place 1 patch (14 mg total) onto the skin daily.   nitroGLYCERIN  0.4 MG SL tablet Commonly known as: NITROSTAT  Place 1 tablet (0.4 mg total) under the tongue every 5 (five) minutes as needed for chest pain.   ondansetron  8 MG tablet Commonly known as: ZOFRAN  Take 1 tablet (8 mg total) by mouth every 8 (eight) hours as needed.   oxyCODONE  5 MG immediate release tablet Commonly known as: Oxy IR/ROXICODONE  Take 5 mg by mouth every 6 (six) hours as needed for severe pain (pain score 7-10) (Reported by patient).   pantoprazole  40 MG tablet Commonly known as: PROTONIX  Take 1 tablet (40  mg total) by mouth daily.   potassium chloride  SA 20 MEQ tablet Commonly known as: KLOR-CON  M Take 1 tablet (20 mEq total) by mouth 2 (two) times daily.   prochlorperazine  10 MG tablet Commonly known as: COMPAZINE  Take 1 tablet (10 mg total) by mouth every 6 (six) hours as needed.   Restasis 0.05 % ophthalmic emulsion Generic drug: cycloSPORINE Place 1 drop into both eyes 2 (two) times daily.        Discharge Exam: Filed Weights   09/11/24 1030  Weight: 76.2 kg   Vitals:   09/12/24 0944 09/12/24 1252  BP: (!) 166/102 (!) 155/73  Pulse:  64  Resp:  18  Temp:  98.8 F (37.1 C)  SpO2:  100%   Examination: Physical Exam:  Constitutional: WN/WD overweight AAF in NAD Respiratory: Diminished to auscultation bilaterally, no wheezing, rales, rhonchi or crackles. Normal respiratory effort and patient is not tachypenic. No accessory muscle use. Unlabored breathing  Cardiovascular: RRR, no murmurs / rubs / gallops. S1 and S2 auscultated. No extremity edema. Abdomen: Soft, mildly tender. Distended  2/2 body habitus. Bowel sounds positive.  GU: Deferred. Musculoskeletal: No clubbing / cyanosis of digits/nails. No joint deformity upper and lower extremities. Skin: No rashes, lesions, ulcers on a limited skin evaluation No induration; Warm and dry.  Neurologic: CN 2-12 grossly intact with no focal deficits. Romberg sign and cerebellar reflexes not assessed.  Psychiatric: Normal judgment and insight. Alert and oriented x 3. Normal mood and appropriate affect.   Condition at discharge: stable  The results of significant diagnostics from this hospitalization (including imaging, microbiology, ancillary and laboratory) are listed below for reference.   Imaging Studies: DG C-Arm 1-60 Min-No Report Result Date: 09/11/2024 Fluoroscopy was utilized by the requesting physician.  No radiographic interpretation.   DG C-Arm 1-60 Min-No Report Result Date: 09/11/2024 Fluoroscopy was utilized by the requesting physician.  No radiographic interpretation.   MR ABDOMEN MRCP W WO CONTAST Result Date: 09/10/2024 CLINICAL DATA:  Pancreatic cancer. Elevated bilirubin. Evaluate for biliary obstruction. EXAM: MRI ABDOMEN WITHOUT AND WITH CONTRAST (INCLUDING MRCP) TECHNIQUE: Multiplanar multisequence MR imaging of the abdomen was performed both before and after the administration of intravenous contrast. Heavily T2-weighted images of the biliary and pancreatic ducts were obtained, and three-dimensional MRCP images were rendered by post processing. CONTRAST:  7.5mL GADAVIST  GADOBUTROL  1 MMOL/ML IV SOLN COMPARISON:  CT imaging 09/06/2024. FINDINGS: Lower chest: No acute findings. Hepatobiliary: No suspicious enhancing lesion within the liver parenchyma. Prominent intrahepatic biliary duct dilatation evident. Common duct is dilated up to 14 mm diameter, similar to CT from 4 days ago but clearly progressive since 05/25/2024 CT. Cystic duct stump is dilated. There is an abrupt cut off of the common bile duct as it enters  the head of the pancreas (see axial T2 haste image 15 of series 23 and coronal MRCP image 18 of series 10). No evidence for common duct stone. Pancreas: Amorphous hypoenhancing lesion in the head of the pancreas measures 2.8 x 1.9 cm and abuts the cut off of the common bile duct. There is diffuse atrophy of pancreatic parenchyma through the body and tail the pancreas with diffuse main duct dilatation evident. Spleen:  No splenomegaly. No suspicious focal mass lesion. Adrenals/Urinary Tract: No adrenal nodule or mass. Tiny hypoenhancing renal lesions bilaterally are compatible with cyst. Stomach/Bowel: Moderate distention of the stomach with food and fluid. Duodenum is normally positioned as is the ligament of Treitz. No small bowel or colonic dilatation within the  visualized abdomen. 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 Extensive collateralization noted in the central abdomen and left upper quadrant. Portal vein reconstitutes distal to the portal splenic confluence and is patent through the left and right divisions. Coronal imaging shows preservation of fat around the celiac axis (coronal postcontrast image 34 of series 20 but loss of fat around the SMA origins suggesting in case mint. 21 x 13 mm focus of enhancing soft tissue is seen abutting the SMA on axial postcontrast image 39 of series 18. Left renal vein is markedly attenuated as a crosses anterior to the aorta and may be involved by tumor. Nonocclusive thrombus is identified in the SM V as it enters the confluence with the splenic vein (see axial postcontrast 46/18). Upper normal lymph nodes are seen in the hepatoduodenal ligament. Other:  No substantial ascites. Musculoskeletal: No focal suspicious marrow enhancement within the visualized bony anatomy. IMPRESSION: 1. 2.8 x 1.9 cm amorphous hypoenhancing lesion in the head of the pancreas with diffuse atrophy of pancreatic parenchyma through the body and  tail the pancreas and diffuse main duct dilatation. Imaging features are compatible with known pancreatic adenocarcinoma. 2. Marked progression of biliary dilatation since the 05/25/2024 exam with abrupt cut off of the common bile duct in the head of the pancreas immediately adjacent to the pancreatic head lesion. No evidence for choledocholithiasis. Patient is scheduled for ERCP with stent placement 09/11/2024. 3. 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 Portal vein reconstitutes distal to the portal splenic confluence and is patent through the left and right divisions. 4. Nonocclusive thrombus in the SM V as it enters the confluence with the splenic vein. Splenic vein patency cannot be confirmed on this study. 5. Loss of fat around the SMA origin suggesting encasement. 21 x 13 mm focus of enhancing soft tissue is seen abutting the SMA compatible with metastatic spread. 6. Marked attenuation of the left renal vein as it crosses anterior to the aorta and may be involved by tumor. 7. Upper normal lymph nodes in the hepatoduodenal ligament. Electronically Signed   By: Camellia Candle M.D.   On: 09/10/2024 09:09   CT CHEST ABDOMEN PELVIS WO CONTRAST Result Date: 09/09/2024 CLINICAL DATA:  Pancreatic cancer, assess treatment response. * Tracking Code: BO * EXAM: CT CHEST, ABDOMEN AND PELVIS WITHOUT CONTRAST TECHNIQUE: Multidetector CT imaging of the chest, abdomen and pelvis was performed following the standard protocol without IV contrast. RADIATION DOSE REDUCTION: This exam was performed according to the departmental dose-optimization program which includes automated exposure control, adjustment of the mA and/or kV according to patient size and/or use of iterative reconstruction technique. COMPARISON:  CT scan chest, abdomen and pelvis from 05/25/2024. FINDINGS: CT CHEST FINDINGS Cardiovascular: Normal  cardiac size. No pericardial effusion. No aortic aneurysm. Mediastinum/Nodes: Visualized thyroid  gland appears grossly unremarkable. No solid / cystic mediastinal masses. The esophagus is nondistended precluding optimal assessment. No axillary, mediastinal or hilar lymphadenopathy by size criteria. Lungs/Pleura: The central tracheo-bronchial tree is patent. There is is sub 5 mm filling defect along the anterior wall of the lower trachea just before the carina (series 5, image 48), favored to represent mucous/secretion. There is a slightly irregular opacity in the central middle lobe inseparable from the adjacent pulmonary vessels and bronchus. Evaluation is limited due to lack of intravenous contrast. The opacity appears grossly unchanged since the prior study and  may represent peribronchovascular lymph node. However, attention on preferably contrast-enhanced follow-up exam is recommended. There are several irregular subcentimeter sized areas of scarring predominantly in the subpleural location of middle lobe and bilateral lower lobes (for example, series 5, images 109 and 113), which are unchanged in the interim. No new mass or consolidation. No pleural effusion or pneumothorax. There are several, stable, scattered sub 4 mm noncalcified nodules in the right upper lobe (marked with electronic arrow sign on series 2007). No new or suspicious lung nodule. Musculoskeletal: A CT Port-a-Cath is seen in the right upper chest wall with the catheter terminating in the cavo-atrial junction region. Visualized soft tissues of the chest wall are otherwise grossly unremarkable. No suspicious osseous lesions. There are mild multilevel degenerative changes in the visualized spine. CT ABDOMEN PELVIS FINDINGS Hepatobiliary: The liver is normal in size. Noncirrhotic configuration. No discrete focal lesion seen within the limitations of this unenhanced exam. No intra hepatic bile duct dilation. Evaluation of extrahepatic bile duct is  markedly limited due to lack of intravenous contrast. There is ill-defined linear configuration hypoattenuating area, paralleling the main portal vein. Just inferior to the portal vein near the porta hepatis the area measures 2.0 x 3.4 (series 2, image 59 cm). This appears increased in size since the prior study. This is of unknown origin/etiology. The differential diagnosis includes dilated extrahepatic bile duct along with small amount of fluid or lymphadenopathy. If needed, this area can be better evaluated with contrast-enhanced MRI abdomen as per liver mass protocol. The gallbladder is surgically absent. Pancreas: Limited evaluation of pancreas due to lack of intravenous contrast. When correlated with prior contrast-enhanced study, redemonstration of an ill-defined slightly hypoattenuating approximately 1.8 x 2.6 cm area in the region of pancreatic head with adjacent fiducial marker, grossly similar to the prior study. There is mild-to-moderate diffuse atrophy of upstream pancreas. No new peripancreatic fat stranding or focal mass. Main pancreatic duct is not dilated. Spleen: Within normal limits. No focal lesion. Adrenals/Urinary Tract: Adrenal glands are unremarkable. No suspicious renal mass within the limitations of this unenhanced exam. There are at least 3, 4 mm or smaller nonobstructing calculi in the right kidney and at least 6, 1-2 mm nonobstructing calculi in the left kidney. No ureterolithiasis or obstructive uropathy on either side. Urinary bladder is under distended, precluding optimal assessment. However, no large mass or stones identified. No perivesical fat stranding. Stomach/Bowel: No disproportionate dilation of the small or large bowel loops. No evidence of abnormal bowel wall thickening or inflammatory changes. The appendix was not visualized; however there is no acute inflammatory process in the right lower quadrant. Vascular/Lymphatic: No ascites or pneumoperitoneum. No abdominal or pelvic  lymphadenopathy, by size criteria. No aneurysmal dilation of the major abdominal arteries. There are mild peripheral atherosclerotic vascular calcifications of the aorta and its major branches. Reproductive: Not well evaluated on the CT scan exam. However, note is made of lobulated bulky anteverted uterus containing multiple leiomyomas, few of which exhibit dystrophic calcifications. The endometrial thickness appears within normal limits. No large adnexal mass seen. Other: Tiny fat containing umbilical hernia. Right periumbilical paramedian anterior abdominal wall surgical scar/metallic staples noted. The soft tissues and abdominal wall are otherwise unremarkable. Musculoskeletal: No suspicious osseous lesions. There are mild - moderate multilevel degenerative changes in the visualized spine. IMPRESSION: 1. There is ill-defined hypoattenuating area in the region of pancreatic head, which is grossly similar to the prior study. However, evaluation is limited due to lack of intravenous contrast. 2. There is ill-defined  linear configuration hypoattenuating area, paralleling the main portal vein. Just inferior to the portal vein near the porta hepatis the area measures 2.0 x 3.4 cm. This appears increased in size since the prior study. This is of unknown origin/etiology. The differential diagnosis includes dilated extrahepatic bile duct along with small amount of fluid or lymphadenopathy. If needed, this area can be better evaluated with contrast-enhanced MRI abdomen as per liver mass protocol. 3. There is a slightly irregular opacity in the central middle lobe inseparable from the adjacent pulmonary vessels and bronchus. Evaluation is limited due to lack of intravenous contrast. The opacity appears grossly unchanged since the prior study and may represent peribronchovascular lymph node. However, attention on preferably contrast-enhanced follow-up exam is recommended. 4. Multiple other nonacute observations, as described  above. Aortic Atherosclerosis (ICD10-I70.0). Electronically Signed   By: Ree Molt M.D.   On: 09/09/2024 09:41   Microbiology: Results for orders placed or performed in visit on 08/31/22  C difficile quick screen w PCR reflex     Status: None   Collection Time: 08/31/22  9:30 AM   Specimen: STOOL  Result Value Ref Range Status   C Diff antigen NEGATIVE NEGATIVE Final   C Diff toxin NEGATIVE NEGATIVE Final   C Diff interpretation No C. difficile detected.  Final    Comment: Performed at [facility], [address] [place], [place] [address]   Labs: CBC: Recent Labs  Lab 09/09/24 0950 09/11/24 1634 09/12/24 0428  WBC 8.6 6.6 8.6  NEUTROABS 5.6  --   --   HGB 12.2 12.8 11.0*  HCT 36.9 37.7 33.8*  MCV 90.4 89.5 91.1  PLT 329 229 217   Basic Metabolic Panel: Recent Labs  Lab 09/09/24 0950 09/11/24 1634 09/12/24 0428  NA 144 144 143  K 2.9* 3.1* 3.8  CL 103 103 105  CO2 30 29 30   GLUCOSE 123* 104* 86  BUN 9 7 8   CREATININE 0.90 0.81 0.78  CALCIUM  10.2 9.8 9.8  MG  --  1.9  --    Liver Function Tests: Recent Labs  Lab 09/09/24 0950 09/11/24 1634 09/12/24 0428  AST 96* 109* 97*  ALT 91* 88* 81*  ALKPHOS 415* 404* 360*  BILITOT 7.1* 8.4* 5.4*  PROT 7.4 7.3 6.7  ALBUMIN 4.0 3.7 3.4*   CBG: No results for input(s): GLUCAP in the last 168 hours.  Discharge time spent: greater than 30 minutes.  Signed: Alejandro Marker, DO [facility] 09/15/2024 "

## 2024-09-16 ENCOUNTER — Inpatient Hospital Stay (HOSPITAL_BASED_OUTPATIENT_CLINIC_OR_DEPARTMENT_OTHER): Admitting: Nurse Practitioner

## 2024-09-16 ENCOUNTER — Other Ambulatory Visit: Payer: Self-pay

## 2024-09-16 ENCOUNTER — Encounter: Payer: Self-pay | Admitting: Nurse Practitioner

## 2024-09-16 ENCOUNTER — Inpatient Hospital Stay

## 2024-09-16 VITALS — BP 160/90 | HR 100 | Temp 97.8°F | Resp 18 | Ht 66.0 in | Wt 159.9 lb

## 2024-09-16 DIAGNOSIS — C251 Malignant neoplasm of body of pancreas: Secondary | ICD-10-CM

## 2024-09-16 DIAGNOSIS — Z5111 Encounter for antineoplastic chemotherapy: Secondary | ICD-10-CM | POA: Diagnosis not present

## 2024-09-16 LAB — CMP (CANCER CENTER ONLY)
ALT: 39 U/L (ref 0–44)
AST: 40 U/L (ref 15–41)
Albumin: 3.8 g/dL (ref 3.5–5.0)
Alkaline Phosphatase: 269 U/L — ABNORMAL HIGH (ref 38–126)
Anion gap: 10 (ref 5–15)
BUN: 12 mg/dL (ref 6–20)
CO2: 31 mmol/L (ref 22–32)
Calcium: 10.1 mg/dL (ref 8.9–10.3)
Chloride: 101 mmol/L (ref 98–111)
Creatinine: 0.64 mg/dL (ref 0.44–1.00)
GFR, Estimated: >60 mL/min
Glucose, Bld: 114 mg/dL — ABNORMAL HIGH (ref 70–99)
Potassium: 3.1 mmol/L — ABNORMAL LOW (ref 3.5–5.1)
Sodium: 142 mmol/L (ref 135–145)
Total Bilirubin: 2.4 mg/dL — ABNORMAL HIGH (ref 0.0–1.2)
Total Protein: 7.5 g/dL (ref 6.5–8.1)

## 2024-09-16 LAB — CBC WITH DIFFERENTIAL (CANCER CENTER ONLY)
Abs Immature Granulocytes: 0.05 K/uL (ref 0.00–0.07)
Basophils Absolute: 0.1 K/uL (ref 0.0–0.1)
Basophils Relative: 0 %
Eosinophils Absolute: 0.1 K/uL (ref 0.0–0.5)
Eosinophils Relative: 1 %
HCT: 41.1 % (ref 36.0–46.0)
Hemoglobin: 13.6 g/dL (ref 12.0–15.0)
Immature Granulocytes: 0 %
Lymphocytes Relative: 18 %
Lymphs Abs: 2.2 K/uL (ref 0.7–4.0)
MCH: 29.6 pg (ref 26.0–34.0)
MCHC: 33.1 g/dL (ref 30.0–36.0)
MCV: 89.3 fL (ref 80.0–100.0)
Monocytes Absolute: 0.9 K/uL (ref 0.1–1.0)
Monocytes Relative: 7 %
Neutro Abs: 9.3 K/uL — ABNORMAL HIGH (ref 1.7–7.7)
Neutrophils Relative %: 74 %
Platelet Count: 289 K/uL (ref 150–400)
RBC: 4.6 MIL/uL (ref 3.87–5.11)
RDW: 17.4 % — ABNORMAL HIGH (ref 11.5–15.5)
WBC Count: 12.6 K/uL — ABNORMAL HIGH (ref 4.0–10.5)
nRBC: 0 % (ref 0.0–0.2)

## 2024-09-16 LAB — AMYLASE: Amylase: 40 U/L (ref 28–100)

## 2024-09-16 LAB — LIPASE, BLOOD: Lipase: 20 U/L (ref 11–51)

## 2024-09-16 MED ORDER — OXYCODONE HCL 5 MG PO TABS: 5.0000 mg | ORAL_TABLET | Freq: Four times a day (QID) | ORAL | 0 refills | Status: DC | PRN

## 2024-09-16 NOTE — Patient Instructions (Signed)

## 2024-09-16 NOTE — Progress Notes (Signed)
 " [facility] OFFICE PROGRESS NOTE   Diagnosis: Pancreas cancer  INTERVAL HISTORY:   Ms. Chavira returns as scheduled.  She underwent an ERCP 09/11/2024.  1 pancreatic stent was placed into suspected ventral pancreatic duct.  She underwent a second ERCP procedure 09/12/2024 with finding of a single localized biliary stricture in the upper third of the main bile duct, malignant appearing.  A stent was placed.  The recently placed pancreatic duct stent was removed.  She reports less abdominal pain since hospital discharge.  She is taking oxycodone  every 4-5 hours without relief.  She has mild intermittent nausea.  No vomiting.  Recent constipation.  No fever.  Oral intake is poor.  Objective:  Vital signs in last 24 hours:  Blood pressure (!) 159/100, pulse 100, temperature 97.8 F (36.6 C), temperature source Temporal, resp. rate 18, height 5' 6 (1.676 m), weight 159 lb 14.4 oz (72.5 kg), last menstrual period 02/07/2016, SpO2 100%.    HEENT: Tongue appears dry. Resp: Lungs clear bilaterally. Cardio: Regular rate and rhythm. GI: Generalized abdominal tenderness. Vascular: No leg edema. Neuro: Alert and oriented. Skin: Warm and dry. Port-A-Cath without erythema.  Lab Results:  Lab Results  Component Value Date   WBC 8.6 09/12/2024   HGB 11.0 (L) 09/12/2024   HCT 33.8 (L) 09/12/2024   MCV 91.1 09/12/2024   PLT 217 09/12/2024   NEUTROABS 5.6 09/09/2024    Imaging:  No results found.  Medications: I have reviewed the patient's current medications.  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 CT abdomen/pelvis 07/30/2022-body/neck pancreas mass with peripheral pancreatic atrophy and duct dilation, suspected encasement of the celiac axis, uterine fibroids MRI abdomen 07/30/2022-3.7 x 4.7 cm pancreas body/tail mass with encasement of the proximal celiac artery, occlusion with collateralization at the confluence of the SMV and portal vein, no evidence of metastatic  disease CT chest 08/01/2022-no intrathoracic metastases Elevated CA 19-9 EUS 08/02/2022-gastric polyp, gastric erosions, 32 x 33 mm pancreas body mass, invasion of the celiac trunk, invasion of the splenoportal confluence, no SMA invasion, 2 malignant appearing nodes in the porta hepatis,T4N1 by endoscopic criteria, FNA biopsy-adenocarcinoma 08/17/2022 [employer]-tumor mutation burden 0.96; MSI high not detected; 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  Stable porta hepatis lymph nodes. Cycle 7 gemcitabine /Abraxane  11/09/2022 Cycle 8 gemcitabine /Abraxane  11/23/2022 Cycle 9 gemcitabine /Abraxane  12/07/2022 Cycle 10 gemcitabine /Abraxane  12/21/2022 Cycle 11 gemcitabine /Abraxane  01/04/2023 Cycle 12 gemcitabine /Abraxane  01/18/2023 Cycle 13 gemcitabine /Abraxane  02/01/2023 Cycle 14 gemcitabine /Abraxane  02/15/2023 CT abdomen/pelvis 02/26/2023-stable appearance of pancreas body mass with vascular encasement and chronic occlusion of the splenic vein, no evidence of metastatic disease Cycle 15 gemcitabine /Abraxane  03/01/2023 Cycle 16 gemcitabine /Abraxane  03/15/2023 Cycle 17 Gemcitabine /Abraxane  03/29/2023 EUS pancreas fiducial marker placement 04/17/2023 Pancreas SBRT 05/07/2023 - 05/17/2023, 33 Gray in 5 fractions CT abdomen/pelvis 09/14/2023: Slight decrease in pancreas body mass with persistent vascular encasement, no evidence of metastatic disease CTs [facility] 01/02/2024-ill-defined hypoattenuating mass in the mid pancreatic body surrounding surgical clips.  Pancreatic duct within the distal pancreatic  body and tail is dilated.  Mass encases the celiac trunk.  Common bile duct is dilated with suggestion of focal stenosis in the mid bile duct.  High-grade stenosis/occlusion of the portal vein.  Prominent vessels in the upper abdomen.  Heterogeneous appearance of the splenic parenchyma with suggestion of an infiltrating hypoattenuating mass/masses versus sequela of splenic infarcts.  Multiple pulmonary nodules in the lung bases concerning for metastatic disease. CTs chest/abdomen/pelvis 01/06/2024-no PE.  Cardiomegaly with small pericardial effusion.  Small irregular focus of density medial right lung base.  Poorly defined pancreatic body mass measuring 3 x 2.2 cm, previously 3 x 2.5 cm, possible extension of infiltrative soft tissue towards the pancreatic neck region with ill-defined density at the porta hepatis compared to prior.  Slight increased extrahepatic biliary and common duct dilatation compared to prior.  No focal splenic abnormality. Cycle 1 gemcitabine /abraxane  02/07/2024 Cycle 2 gemcitabine /Abraxane  03/18/2024 Cycle 3 gemcitabine /Abraxane  04/01/2024, premed changed from Compazine  to Zofran  Cycle 4 gemcitabine /Abraxane  04/14/2024, premedication changed to Aloxi /Decadron  Cycle 5 gemcitabine /Abraxane  04/29/2024, premedication continued Aloxi /Decadron  Cycle 6 gemcitabine /Abraxane  05/14/2024, premedication continued Aloxi /Decadron  CTs 05/25/2024-stable right middle lobe and right lower lobe pulmonary lesions. No new pulmonary lesions. Decrease in size of pancreatic head/body junction lesion and tumor extending down along the celiac axis. No findings for hepatic metastatic disease.  Cycle 7 gemcitabine /Abraxane  05/27/2024, Gemcitabine  dose reduced due to thrombocytopenia Cycle 8 gemcitabine /Abraxane  06/10/2024 Cycle 9 gemcitabine /Abraxane  06/24/2024 Cycle 10 gemcitabine /Abraxane  07/08/2024 Cycle 11 gemcitabine /Abraxane  07/22/2024 Cycle 12 gemcitabine /Abraxane  08/05/2024 Cycle 13 gemcitabine /Abraxane   08/19/2024 Treatment held 09/02/2024 due to progressive rise of liver enzymes, referred for CTs CTs 09/06/2024-ill-defined hypoattenuating area in the region of the pancreatic head, grossly similar to the prior study.  Ill-defined linear configuration hypoattenuating area paralleling the main portal vein.  Just inferior to the portal vein near the porta hepatis the area measures 2.0 x 3.4 cm.  Appears increased in size since the prior study.  Slightly irregular opacity in the central middle lobe inseparable from the adjacent pulmonary vessels and bronchus.  Appears grossly unchanged since the prior study. MRI/MRCP 09/09/2024-2.8 x 1.9 cm amorphous hypoenhancing lesion in the head of the pancreas.  Marked progression of biliary dilatation since 05/25/2024 with abrupt cut off of the common bile duct in the head of the pancreas immediately adjacent to the pancreatic head lesion.  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  Nonocclusive thrombus in the SMV as it enters the confluence with the splenic vein.  Splenic vein patency cannot be confirmed.  Loss of fat around the SMA origin suggesting encasement.  21 x 13 mm focus of enhancing soft tissue seen abutting the SMA compatible with metastatic spread.  Marked attenuation of the left renal vein as it crosses anterior to the aorta and may be involved by tumor.  Upper normal lymph nodes in the hepatoduodenal ligament. ERCP 09/11/2024-1 pancreatic stent placed into suspected ventral pancreatic duct ERCP 09/12/2024-single localized biliary stricture in the upper third of the main bile duct, malignant appearing.  Stent placed.  Pancreatic duct stent removed.     2.  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   Similar size of of enhancing mass in the dorsal right midbrain effacing the cerebral aqueduct.  No new enhancing lesions identified.  She was transferred to [facility].  She underwent an endoscopic third ventriculostomy on 02/12/2024.   Brain CT 03/13/2024-interval resolution of obstructive hydrocephalus and transependymal edema.  Mild interval decrease in size of a mass within the right midbrain, compatible with glioma.  Chronic encephalomalacia changes in the right frontal lobe. 02/12/2024: Endoscopic ventriculostomy  at [facility]   3.  Anorexia/weight loss secondary to #1 4.  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  Abdominal pain 6.  History of colon polyps 7.  Family history of multiple cancers Pancreas cancer maternal aunt, maternal cousin, maternal grandfather Breast cancer-maternal aunt, maternal cousin Prostate cancer-maternal uncle Lung cancer-father 8.  Hypertension 9.  Postmenopausal vaginal bleeding 10.  H. pylori 08/02/2022-amoxicillin  and Flagyl  discontinued 08/21/2022 due to GI symptoms 11.  Syncope event 03/07/2023 12.  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  Anaphylaxis following CT contrast 05/25/2024  Disposition: Ms. Staszewski appears stable.  LFTs are better following stent placement.  She understands our concern for cancer progression and the recommendation to discontinue the current treatment.  We had preliminary discussion regarding other systemic chemotherapy options.  She would like to return in 2 weeks and bring a family member so we can discuss further.    She has had abdominal pain since hospital discharge.  Oxycodone  prescription was refilled.  Lipase is normal.  Amylase pending.  She will return for follow-up as scheduled on 09/30/2024.  She will contact the office in the interim with any problems.  Patient seen with Dr. Cloretta.    Olam Ned ANP/GNP-BC   09/16/2024  1:14 PM  Was a shared visit with Olam Ned.  Ms. Freitas was interviewed and examined. Ms. Salonga underwent underwent an ERCP 09/12/2024 with placement of a stent in the main bile duct.  She developed abdomen and back pain following procedure.  The clinical presentation is consistent with post ERCP pancreatitis.  She will use oxycodone  as needed for pain.  She will go to the emergency room if the pain does not improve.  We we discussed salvage treatment options for the progressive pancreas cancer.  She would like to return 09/30/2024 to discuss details of the FOLFIRINOX regimen.  Arvella Cloretta, MD     "

## 2024-09-18 ENCOUNTER — Telehealth: Payer: Self-pay | Admitting: *Deleted

## 2024-09-18 ENCOUNTER — Telehealth: Payer: Self-pay

## 2024-09-18 ENCOUNTER — Encounter: Payer: Self-pay | Admitting: Internal Medicine

## 2024-09-18 ENCOUNTER — Telehealth: Payer: Self-pay | Admitting: Oncology

## 2024-09-18 DIAGNOSIS — C251 Malignant neoplasm of body of pancreas: Secondary | ICD-10-CM

## 2024-09-18 DIAGNOSIS — E876 Hypokalemia: Secondary | ICD-10-CM

## 2024-09-18 NOTE — Telephone Encounter (Signed)
 Patient advised to go to the ED at [facility] per Olam Ned, NP, Oncology, due to no oral intake in 4 days including fluids and food, weakness, inability to take her medications, and abdominal pain of 7/10 with worsening with p.o. intake. Patient agreed to go to [facility] ED. Olam Ned updated that patient is going.

## 2024-09-18 NOTE — Telephone Encounter (Signed)
 Called PT to schedule PF Appt, day and time confirmed.

## 2024-09-18 NOTE — Telephone Encounter (Signed)
 Spoke with Janina regarding NP suggestion to go to emergency room. She declines and reports she just took #3 20 meq K+ tablets today and has made herself some Kool-Aid and is drinking. Informed her that Olam Ned, NP said to take #2 20 meq K+ twice daily x 3 days with repeat lab on Monday. She agrees and wants to use port for lab. Scheduler made aware.

## 2024-09-21 ENCOUNTER — Other Ambulatory Visit: Payer: Self-pay | Admitting: *Deleted

## 2024-09-21 ENCOUNTER — Inpatient Hospital Stay: Attending: Oncology

## 2024-09-21 ENCOUNTER — Other Ambulatory Visit: Payer: Self-pay | Admitting: Internal Medicine

## 2024-09-21 ENCOUNTER — Inpatient Hospital Stay

## 2024-09-21 ENCOUNTER — Other Ambulatory Visit: Payer: Self-pay

## 2024-09-21 ENCOUNTER — Telehealth: Payer: Self-pay | Admitting: *Deleted

## 2024-09-21 DIAGNOSIS — Z803 Family history of malignant neoplasm of breast: Secondary | ICD-10-CM | POA: Diagnosis not present

## 2024-09-21 DIAGNOSIS — G893 Neoplasm related pain (acute) (chronic): Secondary | ICD-10-CM | POA: Diagnosis not present

## 2024-09-21 DIAGNOSIS — Z8042 Family history of malignant neoplasm of prostate: Secondary | ICD-10-CM | POA: Insufficient documentation

## 2024-09-21 DIAGNOSIS — Z801 Family history of malignant neoplasm of trachea, bronchus and lung: Secondary | ICD-10-CM | POA: Insufficient documentation

## 2024-09-21 DIAGNOSIS — Z79891 Long term (current) use of opiate analgesic: Secondary | ICD-10-CM | POA: Insufficient documentation

## 2024-09-21 DIAGNOSIS — Z5111 Encounter for antineoplastic chemotherapy: Secondary | ICD-10-CM | POA: Diagnosis present

## 2024-09-21 DIAGNOSIS — R630 Anorexia: Secondary | ICD-10-CM | POA: Diagnosis not present

## 2024-09-21 DIAGNOSIS — K831 Obstruction of bile duct: Secondary | ICD-10-CM | POA: Diagnosis not present

## 2024-09-21 DIAGNOSIS — E876 Hypokalemia: Secondary | ICD-10-CM

## 2024-09-21 DIAGNOSIS — C251 Malignant neoplasm of body of pancreas: Secondary | ICD-10-CM | POA: Diagnosis present

## 2024-09-21 DIAGNOSIS — Z800 Family history of malignant neoplasm of digestive organs: Secondary | ICD-10-CM | POA: Insufficient documentation

## 2024-09-21 DIAGNOSIS — R978 Other abnormal tumor markers: Secondary | ICD-10-CM | POA: Diagnosis not present

## 2024-09-21 DIAGNOSIS — R634 Abnormal weight loss: Secondary | ICD-10-CM | POA: Diagnosis not present

## 2024-09-21 DIAGNOSIS — Z452 Encounter for adjustment and management of vascular access device: Secondary | ICD-10-CM | POA: Insufficient documentation

## 2024-09-21 DIAGNOSIS — E860 Dehydration: Secondary | ICD-10-CM

## 2024-09-21 LAB — MAGNESIUM: Magnesium: 1.8 mg/dL (ref 1.7–2.4)

## 2024-09-21 LAB — CMP (CANCER CENTER ONLY)
ALT: 26 U/L (ref 0–44)
AST: 41 U/L (ref 15–41)
Albumin: 4 g/dL (ref 3.5–5.0)
Alkaline Phosphatase: 206 U/L — ABNORMAL HIGH (ref 38–126)
Anion gap: 13 (ref 5–15)
BUN: 13 mg/dL (ref 6–20)
CO2: 29 mmol/L (ref 22–32)
Calcium: 10.1 mg/dL (ref 8.9–10.3)
Chloride: 101 mmol/L (ref 98–111)
Creatinine: 0.89 mg/dL (ref 0.44–1.00)
GFR, Estimated: >60 mL/min
Glucose, Bld: 119 mg/dL — ABNORMAL HIGH (ref 70–99)
Potassium: 3.1 mmol/L — ABNORMAL LOW (ref 3.5–5.1)
Sodium: 143 mmol/L (ref 135–145)
Total Bilirubin: 1.7 mg/dL — ABNORMAL HIGH (ref 0.0–1.2)
Total Protein: 7.4 g/dL (ref 6.5–8.1)

## 2024-09-21 MED ORDER — OXYCODONE HCL 5 MG PO TABS
5.0000 mg | ORAL_TABLET | Freq: Once | ORAL | Status: AC
  Administered 2024-09-21: 5 mg via ORAL
  Filled 2024-09-21: qty 1

## 2024-09-21 MED ORDER — POTASSIUM CHLORIDE 10 MEQ/100ML IV SOLN: 10.0000 meq | INTRAVENOUS | Status: DC

## 2024-09-21 MED ORDER — SODIUM CHLORIDE 0.9 % IV SOLN: INTRAVENOUS | Status: AC

## 2024-09-21 MED ORDER — POTASSIUM CHLORIDE IN NACL 20-0.9 MEQ/L-% IV SOLN
Freq: Once | INTRAVENOUS | Status: AC
  Filled 2024-09-21: qty 1000

## 2024-09-21 NOTE — Telephone Encounter (Addendum)
 Kathleen Stone presented for repeat labs today. K+ today is still 3.1 and she reports she has been taking the KCl 40 meq bid over weekend. Not eating except 1 serving mashed potatoes all weekend, but has been trying to drink KoolAid, soup and chicken broth. Total fluid intake over 24 hour period may be ~ 2 quarts at the most. No nausea today-vomited yesterday. Has had increase in abdominal pain that radiates to her back at 7/10 level since stent placement on 12/27. Takes ~ 1-2 oxycodone  per day. Has not had a BM since that day (not taking anything for bowels). She has not reached out to GI about this.  145/79, 98.3-97-16 w/sat 98% Will give 1 liter NS IV and 20 meq KCl IV today. Patient agrees. Placed call to Dr. Darilyn april was given an appointment with Jessica Zehr, PA 09/22/24 at 0950. [name] agrees to this appointment.

## 2024-09-21 NOTE — Progress Notes (Signed)
 Patient to get IV fluid infusion of Normal Saline with 20mEq per Olam Ned. Patient also getting medication for pain.  Patient tolerated treatment well with no complaints voiced.  Patient left ambulatory in stable condition.  Vital signs stable at discharge.  Follow up as scheduled.

## 2024-09-21 NOTE — Patient Instructions (Signed)
 Visit Information  Thank you for taking time to visit with me today. Please don't hesitate to contact me if I can be of assistance to you before our next scheduled appointment.  Your next care management appointment is by telephone on 10/19/2024 at 10:30 am with Rosaline Finlay, [facility]   Please call the care guide team at [phone] if you need to cancel, schedule, or reschedule an appointment.   Please call the Suicide and Crisis Lifeline: 988 call the [place]  National Suicide Prevention Lifeline: (337) 574-4523 or TTY: 205-731-6662 TTY 619-509-9199) to talk to a trained counselor call 1-800-273-TALK (toll free, 24 hour hotline) if you are experiencing a Mental Health or Behavioral Health Crisis or need someone to talk to.   Olam Ku, RN, BSN [facility], [facility] RN Care Manager Direct Dial: [phone]  Fax: [phone]

## 2024-09-21 NOTE — Patient Instructions (Signed)
 [facility] - A DEPT OF [facility]  Discharge Instructions: Thank you for choosing [facility] to provide your oncology and hematology care.   If you have a lab appointment with the Cancer Center, please go directly to the Cancer Center and check in at the registration area.   Wear comfortable clothing and clothing appropriate for easy access to any Portacath or PICC line.   We strive to give you quality time with your provider. You may need to reschedule your appointment if you arrive late (15 or more minutes).  Arriving late affects you and other patients whose appointments are after yours.  Also, if you miss three or more appointments without notifying the office, you may be dismissed from the clinic at the providers discretion.      For prescription refill requests, have your pharmacy contact our office and allow 72 hours for refills to be completed.    Today you received the following Normal Saline with 20mEq of Potassium IV infusion.  Rehydration, Adult Rehydration is the replacement of fluids, salts, and minerals in the body (electrolytes) that are lost during dehydration. Dehydration is when there is not enough water  or other fluids in the body. This happens when you lose more fluids than you take in. Common causes of dehydration include: Not drinking enough fluids. This can occur when you are ill or doing activities that require a lot of energy, especially in hot weather. Conditions that cause loss of water  or other fluids. These include diarrhea, vomiting, sweating, and urinating a lot. Other illnesses, such as fever or infection. Certain medicines, such as those that remove excess fluid from the body (diuretics). Symptoms of mild or moderate dehydration may include thirst, dry lips and mouth, and dizziness. Symptoms of severe dehydration may include increased heart rate, confusion, fainting, and not urinating. In severe cases, you may  need to get fluids through an IV at the hospital. For mild or moderate cases, you can usually rehydrate at home by drinking certain fluids as told by your health care provider. What are the risks? Your health care provider will talk with you about risks. Your health care provider will talk with you about risks. This may include taking in too much fluid (overhydration). This is rare. Overhydration can cause an imbalance of electrolytes in the body, kidney failure, or a decrease in salt (sodium) levels in the body. Supplies needed: You will need an oral rehydration solution (ORS) if your health care provider tells you to use one. This is a drink to treat dehydration. It can be found in pharmacies and retail stores. How to rehydrate Fluids Follow instructions from your health care provider about what to drink. The kind of fluid and the amount you should drink depend on your condition. In general, you should choose drinks that you prefer. If told by your health care provider, drink an ORS. Make an ORS by following instructions on the package. Start by drinking small amounts, about  cup (120 mL) every 5-10 minutes. Slowly increase how much you drink until you have taken in the amount recommended by your health care provider. Drink enough clear fluids to keep your urine pale yellow. If you were told to drink an ORS, finish it first, then start slowly drinking other clear fluids. Drink fluids such as: Water . This includes sparkling and flavored water . Drinking only water  can lead to having too little sodium in your body (hyponatremia). Follow the advice of your health  care provider. Water  from ice chips you suck on. Fruit juice with water  added to it (diluted). Sports drinks. Hot or cold herbal teas. Broth-based soups. Milk or milk products. Food Follow instructions from your health care provider about what to eat while you rehydrate. Your health care provider may recommend that you slowly begin  eating regular foods in small amounts. Eat foods that contain a healthy balance of electrolytes, such as bananas, oranges, potatoes, tomatoes, and spinach. Avoid foods that are greasy or contain a lot of sugar. In some cases, you may get nutrition through a feeding tube that is passed through your nose and into your stomach (nasogastric tube, or NG tube). This may be done if you have uncontrolled vomiting or diarrhea. Drinks to avoid  Certain drinks may make dehydration worse. While you rehydrate, avoid drinking alcohol. How to tell if you are recovering from dehydration You may be getting better if: You are urinating more often than before you started rehydrating. Your urine is pale yellow. Your energy level improves. You vomit less often. You have diarrhea less often. Your appetite improves or returns to normal. You feel less dizzy or light-headed. Your skin tone and color start to look more normal. Follow these instructions at home: Take over-the-counter and prescription medicines only as told by your health care provider. Do not take sodium tablets. Doing this can lead to having too much sodium in your body (hypernatremia). Contact a health care provider if: You continue to have symptoms of mild or moderate dehydration, such as: Thirst. Dry lips. Slightly dry mouth. Dizziness. Dark urine or less urine than normal. Muscle cramps. You continue to vomit or have diarrhea. Get help right away if: You have symptoms of dehydration that get worse. You have a fever. You have a severe headache. You have been vomiting and have problems, such as: Your vomiting gets worse or does not go away. Your vomit includes blood or green matter (bile). You cannot eat or drink without vomiting. You have problems with urination or bowel movements, such as: Diarrhea that gets worse or does not go away. Blood in your stool (feces). This may cause stool to look black and tarry. Not urinating, or  urinating only a small amount of very dark urine, within 6-8 hours. You have trouble breathing. You have symptoms that get worse with treatment. These symptoms may be an emergency. Get help right away. Call 911. Do not wait to see if the symptoms will go away. Do not drive yourself to the hospital. This information is not intended to replace advice given to you by your health care provider. Make sure you discuss any questions you have with your health care provider. Document Revised: 01/15/2022 Document Reviewed: 01/15/2022 Elsevier Patient Education  2024 Elsevier Inc.   To help prevent nausea and vomiting after your treatment, we encourage you to take your nausea medication as directed.  BELOW ARE SYMPTOMS THAT SHOULD BE REPORTED IMMEDIATELY: *FEVER GREATER THAN 100.4 F (38 C) OR HIGHER *CHILLS OR SWEATING *NAUSEA AND VOMITING THAT IS NOT CONTROLLED WITH YOUR NAUSEA MEDICATION *UNUSUAL SHORTNESS OF BREATH *UNUSUAL BRUISING OR BLEEDING *URINARY PROBLEMS (pain or burning when urinating, or frequent urination) *BOWEL PROBLEMS (unusual diarrhea, constipation, pain near the anus) TENDERNESS IN MOUTH AND THROAT WITH OR WITHOUT PRESENCE OF ULCERS (sore throat, sores in mouth, or a toothache) UNUSUAL RASH, SWELLING OR PAIN  UNUSUAL VAGINAL DISCHARGE OR ITCHING   Items with * indicate a potential emergency and should be followed up as soon  as possible or go to the Emergency Department if any problems should occur.  Please show the CHEMOTHERAPY ALERT CARD or IMMUNOTHERAPY ALERT CARD at check-in to the Emergency Department and triage nurse.  Should you have questions after your visit or need to cancel or reschedule your appointment, please contact [facility] - A DEPT OF [facility]  Dept: [phone]  and follow the prompts.  Office hours are 8:00 a.m. to 4:30 p.m. Monday - Friday. Please note that voicemails left after 4:00 p.m. may not be returned until the  following business day.  We are closed weekends and major holidays. You have access to a nurse at all times for urgent questions. Please call the main number to the clinic Dept: [phone] and follow the prompts.   For any non-urgent questions, you may also contact your provider using MyChart. We now offer e-Visits for anyone 72 and older to request care online for non-urgent symptoms. For details visit [email or web address].   Also download the MyChart app! Go to the app store, search MyChart, open the app, select [place], and log in with your MyChart username and password.

## 2024-09-21 NOTE — Patient Outreach (Signed)
 Complex Care Management   Visit Note  09/21/2024  Name:  Kathleen Stone MRN: [id number] DOB: 1965/09/29  Situation: Referral received for Complex Care Management related to Pancreatic cancer I obtained verbal consent from Patient.  Visit completed with Patient  on the phone  Background:   Past Medical History:  Diagnosis Date   Abnormal stress test 01/16/2017   Brain tumor (benign) (HCC)    DJD (degenerative joint disease) of cervical spine    Galactorrhea of both breasts 2009   R. sided w/ benign papilloma excised in 4/09. L sided in 5/09   Hematuria, microscopic 02/24/2020   Hyperlipidemia    Hypertension    Hypokalemia    LOW BACK PAIN SYNDROME 07/10/2007   Qualifier: Diagnosis of   By: Lanis MD, Christopher         Nausea and vomiting 08/01/2022   Pancreatic cancer [facility])    Personal history of adenomatous colonic polyp    09/2007 - diminutive adenoma [facility])   Stroke [facility])    2021- brain tumor bx caused ? stroke-    Tobacco abuse    Unintentional weight loss 05/26/2022    Assessment: Pt reports she has not had a bowel movement since her surgery on her last admission 09/11/2024 but confirms she is passing gas. States she will follow up with her GI provider soon for further intervention. Current having pain while receiving chemotherapy at the oncology clinic as the clinic is working with pharmacy on administering pain medication during this infusion. Pt requested to completed today's assessment while receiving her infusion at this hour and did not wish to reschedule. Patient Reported Symptoms:  Cognitive Cognitive Status: Able to follow simple commands, Alert and oriented to person, place, and time, Normal speech and language skills   Health Maintenance Behaviors: Annual physical exam  Neurological Neurological Review of Symptoms: Headaches (hx) Neurological Management Strategies: Coping strategies, Medication therapy, Routine screening Neurological Comment: Pt continue with  the recommended medications. Pending brain scan for further intervention  HEENT HEENT Symptoms Reported: No symptoms reported HEENT Management Strategies: Coping strategies, Routine screening HEENT Self-Management Outcome: 4 (good)    Cardiovascular Cardiovascular Symptoms Reported: No symptoms reported Does patient have uncontrolled Hypertension?: Yes Is patient checking Blood Pressure at home?: Yes Patient's Recent BP reading at home: However pt currently receiving her chemotherapy infusion and vitals are being monitored closely but unable to resite any readings at this time. Cardiovascular Management Strategies: Coping strategies, Routine screening, Medication therapy Weight: 159 lb (72.1 kg) Cardiovascular Self-Management Outcome: 4 (good)  Respiratory Respiratory Symptoms Reported: No symptoms reported Respiratory Management Strategies: Coping strategies, Routine screening Respiratory Self-Management Outcome: 4 (good)  Endocrine Endocrine Symptoms Reported: No symptoms reported Is patient diabetic?: No Endocrine Self-Management Outcome: 4 (good)  Gastrointestinal Gastrointestinal Symptoms Reported: Abdominal pain or discomfort Additional Gastrointestinal Details: Pending appointment with GI 09/22/2024 due to no bowel movement since surgery  09/14/2024 admission. Gastrointestinal Management Strategies: Medication therapy Gastrointestinal Self-Management Outcome: 3 (uncertain)    Genitourinary Genitourinary Symptoms Reported: No symptoms reported Genitourinary Self-Management Outcome: 4 (good)  Integumentary Integumentary Symptoms Reported: No symptoms reported Skin Management Strategies: Coping strategies Skin Self-Management Outcome: 4 (good)  Musculoskeletal Musculoskelatal Symptoms Reviewed: Muscle pain Additional Musculoskeletal Details: c/o leg cramps with her ongoing chemotherapy. Pt current taking K+ and continues to have discussion with her provider for ongoing  interventions Musculoskeletal Management Strategies: Coping strategies, Routine screening Musculoskeletal Self-Management Outcome: 4 (good)      Psychosocial Psychosocial Symptoms Reported: No symptoms reported Behavioral Management  Strategies: Support system, Coping strategies Behavioral Health Self-Management Outcome: 4 (good) Major Change/Loss/Stressor/Fears (CP): Denies Quality of Family Relationships: supportive, involved, helpful Do you feel physically threatened by others?: No      Today's Vitals   09/21/24 1213  Weight: 159 lb (72.1 kg)   Pain Scale: 0-10 Pain Score: 8  Pain Type: Acute pain Pain Location: Abdomen Pain Orientation: Medial Pain Descriptors / Indicators: Sharp Pain Onset: On-going Patients Stated Pain Goal: 0 Pain Intervention(s): Medication (See eMAR), Repositioned, Relaxation, Massage  Medications Reviewed Today     Reviewed by Alvia Olam BIRCH, RN (Registered Nurse) on 09/21/24 at 1157  Med List Status: <None>   Medication Order Taking? Sig Documenting Provider Last Dose Status Informant  0.9 %  sodium chloride  infusion [id number]   Thomas, Felcia Huebert K, NP  Active   0.9 % NaCl with KCl 20 mEq/ L  infusion [id number]   Debby Olam POUR, NP  Active   acetaminophen  (TYLENOL ) 325 MG tablet [id number] Yes Take by mouth every 6 (six) hours as needed. [provider]  Active   albuterol  (VENTOLIN  HFA) 108 (90 Base) MCG/ACT inhaler [id number] Yes Inhale 2 puffs into the lungs every 6 (six) hours as needed for wheezing or shortness of breath. Cloretta Arley NOVAK, MD  Active   amLODipine  (NORVASC ) 5 MG tablet [id number] Yes Take 1 tablet (5 mg total) by mouth daily. Heddy Barren, DO  Active   apixaban  (ELIQUIS ) 5 MG TABS tablet [id number]  Take 1 tablet (5 mg total) by mouth 2 (two) times daily.  Patient not taking: Reported on 09/16/2024   Heddy Barren, DO  Active            Med Note MERNA, SUSAN L   Fri Feb 21, 2024  9:00 AM) On Hold x 2 weeks   dexamethasone  (DECADRON ) 4 MG tablet [id number] Yes Take 1 tablet (4 mg total) by mouth 2 (two) times daily. Take one tablet twice daily x 3 days after chemo. Start on 06/25/2024 Debby Olam POUR, NP  Active   EPINEPHrine  0.3 mg/0.3 mL IJ SOAJ injection [id number] Yes Inject 0.3 mg into the muscle as needed for anaphylaxis. Dreama Longs, MD  Active   lidocaine -prilocaine  (EMLA ) cream [id number] Yes Apply 1 Application topically as needed. Cloretta Arley NOVAK, MD  Active   lipase/protease/amylase (CREON ) 36000 UNITS CPEP capsule [id number]  Take 2 capsules (72,000 Units total) by mouth 3 (three) times daily with meals.  Patient not taking: Reported on 09/16/2024   Debby Olam POUR, NP  Active   loperamide (IMODIUM) 2 MG capsule [id number]  Take 2-4 mg by mouth as needed for diarrhea or loose stools. Maximum 8/day  Patient not taking: Reported on 09/16/2024   [provider]  Active   Melatonin 5 MG CAPS [id number]  Take 1 capsule (5 mg total) by mouth at bedtime.  Patient not taking: Reported on 09/16/2024   Homer Shams, MD  Active   mirtazapine  (REMERON ) 7.5 MG tablet [id number] Yes Take 1 tablet (7.5 mg total) by mouth at bedtime. Carrion-Carrero, Shams, MD  Active   nicotine  (NICODERM CQ  - DOSED IN MG/24 HOURS) 14 mg/24hr patch [id number]  Place 1 patch (14 mg total) onto the skin daily.  Patient not taking: Reported on 09/16/2024   Homer Shams, MD  Active   nitroGLYCERIN  (NITROSTAT ) 0.4 MG SL tablet [id number] Yes Place 1 tablet (0.4 mg total) under the tongue every 5 (five) minutes as needed for chest pain. Atway, Rayann N, DO  Active Self  ondansetron  (ZOFRAN ) 8 MG tablet [id number] Yes Take 1 tablet (8 mg total) by mouth every 8 (eight) hours as needed. Cloretta Arley NOVAK, MD  Active   oxyCODONE  (OXY IR/ROXICODONE ) 5 MG immediate release tablet [id number] Yes Take 1-2 tablets (5-10 mg total) by mouth every 6 (six) hours as needed for severe pain (pain score 7-10)  (Reported by patient). Debby Olam POUR, NP  Active   pantoprazole  (PROTONIX ) 40 MG tablet [id number]  Take 1 tablet (40 mg total) by mouth daily.  Patient not taking: Reported on 09/16/2024   Cloretta Arley NOVAK, MD  Active   potassium chloride  SA (KLOR-CON  M) 20 MEQ tablet [id number] Yes Take 1 tablet (20 mEq total) by mouth 2 (two) times daily. Burton, Lacie K, NP  Active            Med Note (COWARD, DEVERE CROME   Fri Sep 18, 2024  2:50 PM) Taking #2 bid x 3 days then recheck lab on 09/21/24  prochlorperazine  (COMPAZINE ) 10 MG tablet [id number] Yes Take 1 tablet (10 mg total) by mouth every 6 (six) hours as needed. Cloretta Arley NOVAK, MD  Active   RESTASIS 0.05 % ophthalmic emulsion [id number] Yes Place 1 drop into both eyes 2 (two) times daily. [provider]  Active            Med Note (   Wed Jul 08, 2024 10:34 AM) Uses prn            Recommendation:   PCP Follow-up Continue Current Plan of Care  Follow Up Plan:   Telephone follow up appointment date/time:  10/19/2024 @ 10:30 am with Rosaline Finlay, RNCM   Olam Ku, RN, BSN [place]  [facility], [facility] RN Care Manager Direct Dial: [phone]  Fax: [phone]

## 2024-09-21 NOTE — Patient Instructions (Signed)

## 2024-09-22 ENCOUNTER — Other Ambulatory Visit: Payer: Self-pay

## 2024-09-22 ENCOUNTER — Telehealth: Payer: Self-pay | Admitting: *Deleted

## 2024-09-22 ENCOUNTER — Ambulatory Visit: Admitting: Gastroenterology

## 2024-09-22 ENCOUNTER — Ambulatory Visit (INDEPENDENT_AMBULATORY_CARE_PROVIDER_SITE_OTHER)
Admission: RE | Admit: 2024-09-22 | Discharge: 2024-09-22 | Disposition: A | Source: Ambulatory Visit | Attending: Gastroenterology | Admitting: Gastroenterology

## 2024-09-22 ENCOUNTER — Ambulatory Visit: Payer: Self-pay | Admitting: Gastroenterology

## 2024-09-22 ENCOUNTER — Encounter: Payer: Self-pay | Admitting: Gastroenterology

## 2024-09-22 VITALS — BP 158/90 | HR 102 | Ht 66.0 in | Wt 157.6 lb

## 2024-09-22 DIAGNOSIS — K5900 Constipation, unspecified: Secondary | ICD-10-CM | POA: Insufficient documentation

## 2024-09-22 DIAGNOSIS — K831 Obstruction of bile duct: Secondary | ICD-10-CM

## 2024-09-22 DIAGNOSIS — K219 Gastro-esophageal reflux disease without esophagitis: Secondary | ICD-10-CM | POA: Diagnosis not present

## 2024-09-22 DIAGNOSIS — C250 Malignant neoplasm of head of pancreas: Secondary | ICD-10-CM

## 2024-09-22 DIAGNOSIS — R1010 Upper abdominal pain, unspecified: Secondary | ICD-10-CM | POA: Diagnosis not present

## 2024-09-22 DIAGNOSIS — C251 Malignant neoplasm of body of pancreas: Secondary | ICD-10-CM

## 2024-09-22 MED ORDER — POTASSIUM CHLORIDE CRYS ER 20 MEQ PO TBCR: 20.0000 meq | EXTENDED_RELEASE_TABLET | Freq: Two times a day (BID) | ORAL | 1 refills | Status: DC

## 2024-09-22 NOTE — Patient Instructions (Addendum)
 Your provider has requested that you have an abdominal x ray before leaving today. Please go to the basement floor to our Radiology department for the test.   BOWEL PURGE:   I am recommending a bowel purge to aid in cleaning out your bowels.    OVER THE COUNTER SHOPPING GUIDE:   Purchase 1 (one) 119 GRAM bottle of Miralax Purchase 1 (one) 32 ounce bottle of Gatorade   STEPS:   Mix the entire bottle of Miralax in the 32 ounces of Gatorade and stir to dissolve completely. After 1 hour, drink the Miralax solution you have prepared. You will drink this mixture over the next 2-3 hours.  You should expect results within 1 to 6 hours after completing this bowel purge.     _______________________________________________________  If your blood pressure at your visit was 140/90 or greater, please contact your primary care physician to follow up on this.  _______________________________________________________  If you are age 60 or older, your body mass index should be between 23-30. Your Body mass index is 25.44 kg/m. If this is out of the aforementioned range listed, please consider follow up with your Primary Care Provider.  If you are age 2 or younger, your body mass index should be between 19-25. Your Body mass index is 25.44 kg/m. If this is out of the aformentioned range listed, please consider follow up with your Primary Care Provider.   ________________________________________________________  The [facility] providers would like to encourage you to use MYCHART to communicate with providers for non-urgent requests or questions.  Due to long hold times on the telephone, sending your provider a message by [facility] may be a faster and more efficient way to get a response.  Please allow 48 business hours for a response.  Please remember that this is for non-urgent requests.  _______________________________________________________  [facility] is using a team-based approach  to care.  Your team is made up of your doctor and two to three APPS. Our APPS (Nurse Practitioners and Physician Assistants) work with your physician to ensure care continuity for you. They are fully qualified to address your health concerns and develop a treatment plan. They communicate directly with your gastroenterologist to care for you. Seeing the Advanced Practice Practitioners on your physician's team can help you by facilitating care more promptly, often allowing for earlier appointments, access to diagnostic testing, procedures, and other specialty referrals.

## 2024-09-22 NOTE — Progress Notes (Addendum)
 "    09/22/2024 Kathleen Stone [id number] 07/10/1966   Discussed the use of AI scribe software for clinical note transcription with the patient, who gave verbal consent to proceed.  History of Present Illness Kathleen Stone is a 59 year old female with pancreatic cancer status post biliary stent placement on 09/12/2024 who presents for follow-up of pain and constipation.  She is a patient of Dr. Avram.  Underwent biliary stent placement two weeks ago for malignant biliary obstruction. Since the procedure, new constant back pain has developed, which was absent prior to stent placement. Pain worsens with eating and is accompanied by a burning sensation as food passes down.  Constipation has been significant, with no bowel movements in the past two weeks.  Not currently receiving chemotherapy and has deferred further treatment until after her birthday later this month. Difficulty with oral intake and generalized weakness are reported. Port remains in place from prior treatments.  Liver function tests improved yesterday when checked.  MRCP 09/09/2024 IMPRESSION: 1. 2.8 x 1.9 cm amorphous hypoenhancing lesion in the head of the pancreas with diffuse atrophy of pancreatic parenchyma through the body and tail the pancreas and diffuse main duct dilatation. Imaging features are compatible with known pancreatic adenocarcinoma. 2. Marked progression of biliary dilatation since the 05/25/2024 exam with abrupt cut off of the common bile duct in the head of the pancreas immediately adjacent to the pancreatic head lesion. No evidence for choledocholithiasis. Patient is scheduled for ERCP with stent placement 09/11/2024. 3. 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 Portal vein reconstitutes distal to the portal splenic confluence and is patent through the left and  right divisions. 4. Nonocclusive thrombus in the SMV as it enters the confluence with the splenic vein. Splenic vein patency cannot be confirmed on this study. 5. Loss of fat around the SMA origin suggesting encasement. 21 x 13 mm focus of enhancing soft tissue is seen abutting the SMA compatible with metastatic spread. 6. Marked attenuation of the left renal vein as it crosses anterior to the aorta and may be involved by tumor. 7. Upper normal lymph nodes in the hepatoduodenal ligament.   ERCP 09/12/2024: - A single localized biliary stricture was found in the upper third of the main bile duct. The stricture was malignant appearing. This stricture was treated with stent placement.  Lab Results  Component Value Date   ALT 26 09/21/2024   AST 41 09/21/2024   ALKPHOS 206 (H) 09/21/2024   BILITOT 1.7 (H) 09/21/2024      Past Medical History:  Diagnosis Date   Abnormal stress test 01/16/2017   Brain tumor (benign) (HCC)    DJD (degenerative joint disease) of cervical spine    Galactorrhea of both breasts 2009   R. sided w/ benign papilloma excised in 4/09. L sided in 5/09   Hematuria, microscopic 02/24/2020   Hyperlipidemia    Hypertension    Hypokalemia    LOW BACK PAIN SYNDROME 07/10/2007   Qualifier: Diagnosis of   By: Lanis MD, Christopher         Nausea and vomiting 08/01/2022   Pancreatic cancer [facility])    Personal history of adenomatous colonic polyp    09/2007 - diminutive adenoma [facility])   Stroke [facility])    2021- brain tumor bx caused ? stroke-    Tobacco abuse    Unintentional weight loss 05/26/2022  Past Surgical History:  Procedure Laterality Date   APPENDECTOMY     BILIARY STENT PLACEMENT N/A 09/12/2024   Procedure: INSERTION, STENT, BILE DUCT;  Surgeon: Avram Lupita BRAVO, MD;  Location: [facility];  Service: Gastroenterology;  Laterality: N/A;   BIOPSY  08/02/2022   Procedure: BIOPSY;  Surgeon: Wilhelmenia Aloha Raddle., MD;  Location: [facility];  Service:  Gastroenterology;;   BIOPSY  04/17/2023   Procedure: BIOPSY;  Surgeon: Wilhelmenia Aloha Raddle., MD;  Location: [facility];  Service: Gastroenterology;;   BRAIN BIOPSY     [id number]   BREAST SURGERY Right 12/2007   central duct excision   CHOLECYSTECTOMY N/A 12/08/2019   Procedure: LAPAROSCOPIC CHOLECYSTECTOMY;  Surgeon: Rubin Calamity, MD;  [facility];  Service: General;  Laterality: N/A;   COLONOSCOPY  2009   COLONOSCOPY  09/14/2020   COLONOSCOPY W/ BIOPSIES AND POLYPECTOMY  09/2007   Dr. Avram. 2 small adenomatous polyps, internal hemorrhoids-Grade 1. Rec  routine colonoscopy at age 36.   COLONOSCOPY W/ BIOPSIES AND POLYPECTOMY     ERCP N/A 09/11/2024   Procedure: ERCP, WITH INTERVENTION IF INDICATED;  Surgeon: Avram Lupita BRAVO, MD;  Location: [facility];  Service: Gastroenterology;  Laterality: N/A;   ERCP N/A 09/12/2024   Procedure: ERCP, WITH INTERVENTION IF INDICATED;  Surgeon: Avram Lupita BRAVO, MD;  Location: [facility];  Service: Gastroenterology;  Laterality: N/A;   ESOPHAGOGASTRODUODENOSCOPY N/A 09/11/2024   Procedure: EGD (ESOPHAGOGASTRODUODENOSCOPY);  Surgeon: Avram Lupita BRAVO, MD;  Location: [facility];  Service: Gastroenterology;  Laterality: N/A;   ESOPHAGOGASTRODUODENOSCOPY (EGD) WITH PROPOFOL  N/A 08/02/2022   Procedure: ESOPHAGOGASTRODUODENOSCOPY (EGD) WITH PROPOFOL ;  Surgeon: Wilhelmenia Aloha Raddle., MD;  Location: [facility];  Service: Gastroenterology;  Laterality: N/A;   ESOPHAGOGASTRODUODENOSCOPY (EGD) WITH PROPOFOL  N/A 04/17/2023   Procedure: ESOPHAGOGASTRODUODENOSCOPY (EGD) WITH PROPOFOL ;  Surgeon: Wilhelmenia Aloha Raddle., MD;  Location: [facility];  Service: Gastroenterology;  Laterality: N/A;   EUS N/A 08/02/2022   Procedure: FULL UPPER ENDOSCOPIC ULTRASOUND (EUS) RADIAL;  Surgeon: Wilhelmenia Aloha Raddle., MD;  Location: [facility];  Service: Gastroenterology;  Laterality: N/A;   EUS N/A 04/17/2023   Procedure: UPPER ENDOSCOPIC ULTRASOUND (EUS)  RADIAL;  Surgeon: Wilhelmenia Aloha Raddle., MD;  Location: [facility];  Service: Gastroenterology;  Laterality: N/A;   FIDUCIAL MARKER PLACEMENT N/A 04/17/2023   Procedure: FIDUCIAL MARKER PLACEMENT;  Surgeon: Wilhelmenia Aloha Raddle., MD;  Location: [facility];  Service: Gastroenterology;  Laterality: N/A;   FINE NEEDLE ASPIRATION N/A 08/02/2022   Procedure: FINE NEEDLE ASPIRATION (FNA) LINEAR;  Surgeon: Wilhelmenia Aloha Raddle., MD;  Location: [facility];  Service: Gastroenterology;  Laterality: N/A;   IR IMAGING GUIDED PORT INSERTION  08/03/2022   NASAL SEPTOPLASTY W/ TURBINOPLASTY Bilateral 04/2008   Septoplasty with bilateral inferior turbinate reduction; Dr. Ethyl   POLYPECTOMY  08/02/2022   Procedure: POLYPECTOMY;  Surgeon: Wilhelmenia Aloha Raddle., MD;  Location: [facility];  Service: Gastroenterology;;   SHOULDER OPEN ROTATOR CUFF REPAIR Right     after hurting it by moving heavy objecs    reports that she has been smoking cigarettes. She has a 54 pack-year smoking history. She has never used smokeless tobacco. She reports that she does not drink alcohol and does not use drugs. family history includes Breast cancer in her cousin and maternal aunt; Diabetes in her mother; Heart failure in her mother; Lung cancer in her father; Pancreatic cancer in her cousin, maternal aunt, and maternal grandfather; Prostate cancer in her maternal uncle; Stomach cancer in her cousin. Allergies[1]    Outpatient  Encounter Medications as of 09/22/2024  Medication Sig   acetaminophen  (TYLENOL ) 325 MG tablet Take by mouth every 6 (six) hours as needed.   albuterol  (VENTOLIN  HFA) 108 (90 Base) MCG/ACT inhaler Inhale 2 puffs into the lungs every 6 (six) hours as needed for wheezing or shortness of breath.   amLODipine  (NORVASC ) 5 MG tablet Take 1 tablet (5 mg total) by mouth daily.   dexamethasone  (DECADRON ) 4 MG tablet Take 1 tablet (4 mg total) by mouth 2 (two) times daily. Take one tablet twice daily x 3  days after chemo. Start on 06/25/2024   EPINEPHrine  0.3 mg/0.3 mL IJ SOAJ injection Inject 0.3 mg into the muscle as needed for anaphylaxis.   lidocaine -prilocaine  (EMLA ) cream Apply 1 Application topically as needed.   mirtazapine  (REMERON ) 7.5 MG tablet Take 1 tablet (7.5 mg total) by mouth at bedtime.   nitroGLYCERIN  (NITROSTAT ) 0.4 MG SL tablet Place 1 tablet (0.4 mg total) under the tongue every 5 (five) minutes as needed for chest pain.   ondansetron  (ZOFRAN ) 8 MG tablet Take 1 tablet (8 mg total) by mouth every 8 (eight) hours as needed.   oxyCODONE  (OXY IR/ROXICODONE ) 5 MG immediate release tablet Take 1-2 tablets (5-10 mg total) by mouth every 6 (six) hours as needed for severe pain (pain score 7-10) (Reported by patient).   potassium chloride  SA (KLOR-CON  M) 20 MEQ tablet Take 1 tablet (20 mEq total) by mouth 2 (two) times daily.   prochlorperazine  (COMPAZINE ) 10 MG tablet Take 1 tablet (10 mg total) by mouth every 6 (six) hours as needed.   RESTASIS 0.05 % ophthalmic emulsion Place 1 drop into both eyes 2 (two) times daily.   [Paused] apixaban  (ELIQUIS ) 5 MG TABS tablet Take 1 tablet (5 mg total) by mouth 2 (two) times daily. (Patient not taking: Reported on 09/16/2024)   lipase/protease/amylase (CREON ) 36000 UNITS CPEP capsule Take 2 capsules (72,000 Units total) by mouth 3 (three) times daily with meals. (Patient not taking: Reported on 09/16/2024)   loperamide (IMODIUM) 2 MG capsule Take 2-4 mg by mouth as needed for diarrhea or loose stools. Maximum 8/day (Patient not taking: Reported on 09/16/2024)   Melatonin 5 MG CAPS Take 1 capsule (5 mg total) by mouth at bedtime. (Patient not taking: Reported on 09/16/2024)   nicotine  (NICODERM CQ  - DOSED IN MG/24 HOURS) 14 mg/24hr patch Place 1 patch (14 mg total) onto the skin daily. (Patient not taking: Reported on 09/16/2024)   pantoprazole  (PROTONIX ) 40 MG tablet Take 1 tablet (40 mg total) by mouth daily. (Patient not taking: Reported on  09/16/2024)   No facility-administered encounter medications on file as of 09/22/2024.    REVIEW OF SYSTEMS  : All other systems reviewed and negative except where noted in the History of Present Illness.   PHYSICAL EXAM: BP (!) 158/90   Pulse (!) 102   Ht 5' 6 (1.676 m)   Wt 157 lb 9.6 oz (71.5 kg)   LMP 02/07/2016   BMI 25.44 kg/m  General: Well developed female in no acute distress Head: Normocephalic and atraumatic Eyes:  Sclerae anicteric, conjunctiva pink. Ears: Normal auditory acuity Lungs: Clear throughout to auscultation; no W/R/R. Heart: Slightly tachycardic but regular rhythm. Abdomen: Soft, non-distended.  BS present.  Some diffuse TTP. Musculoskeletal: Symmetrical with no gross deformities  Skin: No lesions on visible extremities Extremities: No edema  Neurological: Alert oriented x 4, grossly non-focal Psychological:  Alert and cooperative. Normal mood and affect  Assessment & Plan Pancreatic cancer  with biliary obstruction Subacute biliary obstruction status post stent placement with improving laboratory values and drainage. New abdominal pain is present; pancreatitis is unlikely given normal pancreatic enzymes. Stent complication is considered but less likely due to clinical improvement otherwise. - Ordered abdominal x-ray to assess stent position and evaluate alternative causes of pain, ? constipation.  Constipation Acute constipation likely exacerbated by recent stent placement and opioid use, contributing to abdominal and back pain. - Miralax bowel purge instructed. - Instructed to continue daily Miralax after initial cleanout to maintain regular bowel movements.  Gastroesophageal reflux symptoms Intermittent burning pain with eating, likely due to gastroesophageal reflux; recent endoscopy demonstrated normal upper gastrointestinal tract. - Recommended over-the-counter antacids (Maalox, Mylanta, Tums, Pepto-Bismol) as needed for symptom relief.   CC:   Harrie Bruckner, DO     [facility] Attending    I agree with the Advanced Practitioner's note, impression and recommendations with the following additions:  Stent's may cause some pain but agree that constipation could be contributing. Also could be the cancer itself.  Lupita CHARLENA Commander, MD, [facility]      [1]  Allergies Allergen Reactions   Iohexol  Anaphylaxis, Hives and Other (See Comments)    05/25/24 - Pt given contrast for CT after 13 hr prep, had reaction and needed care in ER per MD evaluation.    "

## 2024-09-22 NOTE — Telephone Encounter (Signed)
 Called Ms. Rue to confirm she saw GI today. She reports she did and they sent her down to do abdominal xray today. Informed her that Olam Ned, NP said to continue the KCl 20 meq bid and follow up as scheduled. Asked if she needs refill and she states she has plenty (according to script quantity she should be out). Sent new script to pharmacy

## 2024-09-23 ENCOUNTER — Other Ambulatory Visit

## 2024-09-26 ENCOUNTER — Other Ambulatory Visit: Payer: Self-pay | Admitting: Oncology

## 2024-09-30 ENCOUNTER — Inpatient Hospital Stay

## 2024-09-30 ENCOUNTER — Inpatient Hospital Stay: Admitting: Nurse Practitioner

## 2024-09-30 ENCOUNTER — Encounter: Payer: Self-pay | Admitting: Nurse Practitioner

## 2024-09-30 ENCOUNTER — Other Ambulatory Visit: Payer: Self-pay

## 2024-09-30 ENCOUNTER — Other Ambulatory Visit (HOSPITAL_BASED_OUTPATIENT_CLINIC_OR_DEPARTMENT_OTHER): Payer: Self-pay

## 2024-09-30 ENCOUNTER — Encounter: Payer: Self-pay | Admitting: Oncology

## 2024-09-30 VITALS — BP 170/103 | HR 99 | Temp 98.2°F | Resp 18 | Ht 66.0 in | Wt 154.0 lb

## 2024-09-30 DIAGNOSIS — C251 Malignant neoplasm of body of pancreas: Secondary | ICD-10-CM

## 2024-09-30 DIAGNOSIS — Z5111 Encounter for antineoplastic chemotherapy: Secondary | ICD-10-CM | POA: Diagnosis not present

## 2024-09-30 LAB — CMP (CANCER CENTER ONLY)
ALT: 14 U/L (ref 0–44)
AST: 27 U/L (ref 15–41)
Albumin: 3.9 g/dL (ref 3.5–5.0)
Alkaline Phosphatase: 156 U/L — ABNORMAL HIGH (ref 38–126)
Anion gap: 12 (ref 5–15)
BUN: 11 mg/dL (ref 6–20)
CO2: 29 mmol/L (ref 22–32)
Calcium: 10.1 mg/dL (ref 8.9–10.3)
Chloride: 102 mmol/L (ref 98–111)
Creatinine: 0.7 mg/dL (ref 0.44–1.00)
GFR, Estimated: >60 mL/min
Glucose, Bld: 122 mg/dL — ABNORMAL HIGH (ref 70–99)
Potassium: 3.1 mmol/L — ABNORMAL LOW (ref 3.5–5.1)
Sodium: 143 mmol/L (ref 135–145)
Total Bilirubin: 1.4 mg/dL — ABNORMAL HIGH (ref 0.0–1.2)
Total Protein: 7.3 g/dL (ref 6.5–8.1)

## 2024-09-30 LAB — CBC WITH DIFFERENTIAL (CANCER CENTER ONLY)
Abs Immature Granulocytes: 0.03 K/uL (ref 0.00–0.07)
Basophils Absolute: 0 K/uL (ref 0.0–0.1)
Basophils Relative: 0 %
Eosinophils Absolute: 0.1 K/uL (ref 0.0–0.5)
Eosinophils Relative: 1 %
HCT: 40.9 % (ref 36.0–46.0)
Hemoglobin: 13.6 g/dL (ref 12.0–15.0)
Immature Granulocytes: 0 %
Lymphocytes Relative: 21 %
Lymphs Abs: 2 K/uL (ref 0.7–4.0)
MCH: 29.6 pg (ref 26.0–34.0)
MCHC: 33.3 g/dL (ref 30.0–36.0)
MCV: 89.1 fL (ref 80.0–100.0)
Monocytes Absolute: 0.6 K/uL (ref 0.1–1.0)
Monocytes Relative: 6 %
Neutro Abs: 6.9 K/uL (ref 1.7–7.7)
Neutrophils Relative %: 72 %
Platelet Count: 188 K/uL (ref 150–400)
RBC: 4.59 MIL/uL (ref 3.87–5.11)
RDW: 15.9 % — ABNORMAL HIGH (ref 11.5–15.5)
WBC Count: 9.6 K/uL (ref 4.0–10.5)
nRBC: 0 % (ref 0.0–0.2)

## 2024-09-30 LAB — MAGNESIUM: Magnesium: 1.8 mg/dL (ref 1.7–2.4)

## 2024-09-30 MED ORDER — MORPHINE SULFATE ER 15 MG PO TBCR
15.0000 mg | EXTENDED_RELEASE_TABLET | Freq: Two times a day (BID) | ORAL | 0 refills | Status: AC
  Filled 2024-09-30: qty 60, 30d supply, fill #0

## 2024-09-30 MED ORDER — PROCHLORPERAZINE MALEATE 10 MG PO TABS: 10.0000 mg | ORAL_TABLET | Freq: Four times a day (QID) | ORAL | 1 refills | Status: AC | PRN

## 2024-09-30 NOTE — Progress Notes (Signed)
 Printed/ given Folfirinox paperwork to paperwork per provider request.

## 2024-09-30 NOTE — Progress Notes (Signed)
 " Kathleen Stone   Diagnosis: Pancreas cancer  INTERVAL HISTORY:   Kathleen Stone returns as scheduled.  She is accompanied by her sister Kathleen Stone.  We were going to discuss FOLFIRINOX chemotherapy.  Unfortunately she is still having considerable abdominal pain which she feels is worse.  Intermittently she feels the pain in her back as well.  She is taking oxycodone  frequently with partial relief.  She is nauseated when someone talks about food.  No vomiting.  No diarrhea.  Bowels are moving.    Objective:  Vital signs in last 24 hours:  Blood pressure (!) 170/103, pulse 99, temperature 98.2 F (36.8 C), temperature source Temporal, resp. rate 18, height 5' 6 (1.676 m), weight 154 lb (69.9 kg), last menstrual period 02/07/2016, SpO2 100%.    Resp: Lungs clear bilaterally. Cardio: Regular rate and rhythm. GI: Abdomen is soft, marked tenderness mainly at the left abdomen. Vascular: No leg edema. Neuro: Alert and oriented. Skin: Warm and dry. Port-A-Cath without erythema.  Lab Results:  Lab Results  Component Value Date   WBC 12.6 (H) 09/16/2024   HGB 13.6 09/16/2024   HCT 41.1 09/16/2024   MCV 89.3 09/16/2024   PLT 289 09/16/2024   NEUTROABS 9.3 (H) 09/16/2024    Imaging:  No results found.  Medications: I have reviewed the patient's current medications.  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 CT abdomen/pelvis 07/30/2022-body/neck pancreas mass with peripheral pancreatic atrophy and duct dilation, suspected encasement of the celiac axis, uterine fibroids MRI abdomen 07/30/2022-3.7 x 4.7 cm pancreas body/tail mass with encasement of the proximal celiac artery, occlusion with collateralization at the confluence of the SMV and portal vein, no evidence of metastatic disease CT chest 08/01/2022-no intrathoracic metastases Elevated CA 19-9 EUS 08/02/2022-gastric polyp, gastric erosions, 32 x 33 mm pancreas body mass, invasion of the celiac trunk,  invasion of the splenoportal confluence, no SMA invasion, 2 malignant appearing nodes in the porta hepatis,T4N1 by endoscopic criteria, FNA biopsy-adenocarcinoma 08/17/2022 [employer]-tumor mutation burden 0.96; MSI high not detected; 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  Stable porta hepatis lymph nodes. Cycle 7 gemcitabine /Abraxane  11/09/2022 Cycle 8 gemcitabine /Abraxane  11/23/2022 Cycle 9 gemcitabine /Abraxane  12/07/2022 Cycle 10 gemcitabine /Abraxane  12/21/2022 Cycle 11 gemcitabine /Abraxane  01/04/2023 Cycle 12 gemcitabine /Abraxane  01/18/2023 Cycle 13 gemcitabine /Abraxane  02/01/2023 Cycle 14 gemcitabine /Abraxane  02/15/2023 CT abdomen/pelvis 02/26/2023-stable appearance of pancreas body mass with vascular encasement and chronic occlusion of the splenic vein, no evidence of metastatic disease Cycle 15 gemcitabine /Abraxane  03/01/2023 Cycle 16 gemcitabine /Abraxane  03/15/2023 Cycle 17 Gemcitabine /Abraxane  03/29/2023 EUS pancreas fiducial marker placement 04/17/2023 Pancreas SBRT 05/07/2023 - 05/17/2023, 33 Gray in 5 fractions CT abdomen/pelvis 09/14/2023: Slight decrease in pancreas body mass with persistent vascular encasement, no evidence of metastatic disease CTs [facility] 01/02/2024-ill-defined hypoattenuating mass in the mid pancreatic body surrounding surgical clips.  Pancreatic duct within the distal pancreatic body and tail is dilated.  Mass encases the celiac trunk.  Common bile duct is dilated with suggestion of focal stenosis in the mid bile duct.  High-grade stenosis/occlusion of the  portal vein.  Prominent vessels in the upper abdomen.  Heterogeneous appearance of the splenic parenchyma with suggestion of an infiltrating hypoattenuating mass/masses versus sequela of splenic infarcts.  Multiple pulmonary nodules in the lung  bases concerning for metastatic disease. CTs chest/abdomen/pelvis 01/06/2024-no PE.  Cardiomegaly with small pericardial effusion.  Small irregular focus of density medial right lung base.  Poorly defined pancreatic body mass measuring 3 x 2.2 cm, previously 3 x 2.5 cm, possible extension of infiltrative soft tissue towards the pancreatic neck region with ill-defined density at the porta hepatis compared to prior.  Slight increased extrahepatic biliary and common duct dilatation compared to prior.  No focal splenic abnormality. Cycle 1 gemcitabine /abraxane  02/07/2024 Cycle 2 gemcitabine /Abraxane  03/18/2024 Cycle 3 gemcitabine /Abraxane  04/01/2024, premed changed from Compazine  to Zofran  Cycle 4 gemcitabine /Abraxane  04/14/2024, premedication changed to Aloxi /Decadron  Cycle 5 gemcitabine /Abraxane  04/29/2024, premedication continued Aloxi /Decadron  Cycle 6 gemcitabine /Abraxane  05/14/2024, premedication continued Aloxi /Decadron  CTs 05/25/2024-stable right middle lobe and right lower lobe pulmonary lesions. No new pulmonary lesions. Decrease in size of pancreatic head/body junction lesion and tumor extending down along the celiac axis. No findings for hepatic metastatic disease.  Cycle 7 gemcitabine /Abraxane  05/27/2024, Gemcitabine  dose reduced due to thrombocytopenia Cycle 8 gemcitabine /Abraxane  06/10/2024 Cycle 9 gemcitabine /Abraxane  06/24/2024 Cycle 10 gemcitabine /Abraxane  07/08/2024 Cycle 11 gemcitabine /Abraxane  07/22/2024 Cycle 12 gemcitabine /Abraxane  08/05/2024 Cycle 13 gemcitabine /Abraxane  08/19/2024 Treatment held 09/02/2024 due to progressive rise of liver enzymes, referred for CTs CTs 09/06/2024-ill-defined hypoattenuating area in the region of the pancreatic head,  grossly similar to the prior study.  Ill-defined linear configuration hypoattenuating area paralleling the main portal vein.  Just inferior to the portal vein near the porta hepatis the area measures 2.0 x 3.4 cm.  Appears increased in size since the prior study.  Slightly irregular opacity in the central middle lobe inseparable from the adjacent pulmonary vessels and bronchus.  Appears grossly unchanged since the prior study. MRI/MRCP 09/09/2024-2.8 x 1.9 cm amorphous hypoenhancing lesion in the head of the pancreas.  Marked progression of biliary dilatation since 05/25/2024 with abrupt cut off of the common bile duct in the head of the pancreas immediately adjacent to the pancreatic head lesion.  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  Nonocclusive thrombus in the SMV as it enters the confluence with the splenic vein.  Splenic vein patency cannot be confirmed.  Loss of fat around the SMA origin suggesting encasement.  21 x 13 mm focus of enhancing soft tissue seen abutting the SMA compatible with metastatic spread.  Marked attenuation of the left renal vein as it crosses anterior to the aorta and may be involved by tumor.  Upper normal lymph nodes in the hepatoduodenal ligament. ERCP 09/11/2024-1 pancreatic stent placed into suspected ventral pancreatic duct ERCP 09/12/2024-single localized biliary stricture in the upper third of the main bile duct, malignant appearing.  Stent placed.  Pancreatic duct stent removed.     2.  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  Similar size of of enhancing mass in the dorsal right midbrain effacing the cerebral aqueduct.  No new enhancing lesions identified.  She was transferred to [facility].  She underwent an  endoscopic third ventriculostomy on 02/12/2024.   Brain CT 03/13/2024-interval resolution of obstructive hydrocephalus and transependymal edema.  Mild interval decrease in size of a mass within the right midbrain, compatible with glioma.  Chronic encephalomalacia changes in the right frontal lobe. 02/12/2024: Endoscopic ventriculostomy at [facility]   3.  Anorexia/weight loss secondary to #1 4.  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  Abdominal pain 6.  History  of colon polyps 7.  Family history of multiple cancers Pancreas cancer maternal aunt, maternal cousin, maternal grandfather Breast cancer-maternal aunt, maternal cousin Prostate cancer-maternal uncle Lung cancer-father 8.  Hypertension 9.  Postmenopausal vaginal bleeding 10.  H. pylori 08/02/2022-amoxicillin  and Flagyl  discontinued 08/21/2022 due to GI symptoms 11.  Syncope event 03/07/2023 12.  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  Anaphylaxis following CT contrast 05/25/2024    Disposition: Kathleen Stone has a pancreas cancer with concern for progression due to recent biliary obstruction and now worsening abdominal/back pain.  We reviewed the recommendation to begin chemotherapy on the FOLFIRINOX regimen.  We reviewed potential side effects associated with chemotherapy including bone marrow toxicity, nausea, hair loss, allergic reaction.  We reviewed the potential for diarrhea, both early and late phase, associated with irinotecan.  We reviewed the various forms of neuropathy associated with oxaliplatin.  She was provided with printed information as well.  She would like to proceed with FOLFIRINOX.  She understands the recommendation for FOLFOX with cycle 1.  Plan to add irinotecan with cycle 2 or 3 pending tolerance.  For the pain she will begin MS Contin  15 mg every 12 hours.  She will continue oxycodone  as needed for breakthrough pain.  We will obtain testing for DPD deficiency  and irinotecan metabolism.  She will return for cycle 1 FOLFOX 10/07/2024.  We will see her in follow-up prior to treatment.  Patient seen with Dr. Cloretta.      Kathleen Stone   09/30/2024  1:16 PM  This was a shared visit with Kathleen Stone.  Kathleen Stone was interviewed and examined.  She has persistent severe abdominal/back pain following placement of the biliary stent.  She does not appear to have pancreatitis.  The pain is most likely secondary to the pancreas tumor.  She had similar pain when she presented with pancreas cancer in 2023.  We discussed treatment options.  I recommend FOLFIRINOX.  We reviewed potential toxicities associated with the FOLFIRINOX regimen including the chance of allergic reaction, bone marrow toxicity, infection, bleeding, neuropathy, and nausea/vomiting.  We reviewed the alopecia and diarrhea associated with irinotecan.  She agrees to proceed.  The plan is to begin treatment with FOLFOX.  We will add irinotecan into the regimen depending on tolerance.  We adjusted the narcotic pain regimen today.  The plan is to begin FOLFOX 10/07/2024.  A treatment plan was entered today.  Kathleen Cloretta, MD      "

## 2024-09-30 NOTE — Progress Notes (Signed)
 Patient seen by Olam Ned NP today  Vitals are within treatment parameters:Yes   Labs are within treatment parameters: No (Please specify and give further instructions.)   Treatment plan has been signed: No   Per physician team, Patient will not be receiving treatment today.  No treatment today.  Will De access her

## 2024-09-30 NOTE — Progress Notes (Signed)
 DISCONTINUE ON PATHWAY REGIMEN - Pancreatic Adenocarcinoma     A cycle is every 28 days:     Nab-paclitaxel  (protein bound)      Gemcitabine    **Always confirm dose/schedule in your pharmacy ordering system**  PRIOR TREATMENT: [id number]: Nab-Paclitaxel  (Abraxane ) 125 mg/m2 D1, 8, 15 + Gemcitabine  1,000 mg/m2 D1, 8, 15 q28 Days  START OFF PATHWAY REGIMEN - Pancreatic Adenocarcinoma   [id number]:mFOLFIRINOX (Fluorouracil 2,400 mg/m2/cycle; Irinotecan 150 mg/m2) q14 Days:   A cycle is every 14 days:     Irinotecan      Oxaliplatin      Leucovorin      Fluorouracil   **Always confirm dose/schedule in your pharmacy ordering system**  Patient Characteristics: Locally Advanced, Anatomically Unresectable, M0, Second Line, MSS/pMMR or MSI Unknown, Gemcitabine -Based Therapy First Line Therapeutic Status: Locally Advanced, Anatomically Unresectable, M0 Line of Therapy: Second Line Microsatellite/Mismatch Repair Status: MSS/pMMR Intent of Therapy: Non-Curative / Palliative Intent, Discussed with Patient

## 2024-10-01 ENCOUNTER — Encounter (HOSPITAL_COMMUNITY): Payer: Self-pay

## 2024-10-01 ENCOUNTER — Telehealth (HOSPITAL_COMMUNITY): Payer: Self-pay | Admitting: Student in an Organized Health Care Education/Training Program

## 2024-10-01 ENCOUNTER — Encounter (HOSPITAL_COMMUNITY): Admitting: Student in an Organized Health Care Education/Training Program

## 2024-10-01 ENCOUNTER — Encounter: Payer: Self-pay | Admitting: *Deleted

## 2024-10-01 LAB — CANCER ANTIGEN 19-9: CA 19-9: 1232 U/mL — ABNORMAL HIGH (ref 0–35)

## 2024-10-01 NOTE — Telephone Encounter (Signed)
 Patient did not show up for the appointment.  Appointment reminders were sent to patient's phone and email.  Attempted to call the patient at her mobile number but received no response. Left a HIPAA compliant voicemail for patient to reschedule.

## 2024-10-01 NOTE — Progress Notes (Signed)
 Pharmacist Chemotherapy Monitoring - Initial Assessment    Anticipated start date: 10/06/24   The following has been reviewed per standard work regarding the patient's treatment regimen: The patient's diagnosis, treatment plan and drug doses, and organ/hematologic function Lab orders and baseline tests specific to treatment regimen  The treatment plan start date, drug sequencing, and pre-medications Prior authorization status  Patient's documented medication list, including drug-drug interaction screen and prescriptions for anti-emetics and supportive care specific to the treatment regimen The drug concentrations, fluid compatibility, administration routes, and timing of the medications to be used The patient's access for treatment and lifetime cumulative dose history, if applicable  The patient's medication allergies and previous infusion related reactions, if applicable   Changes made to treatment plan:  N/A  Follow up needed:  Pending authorization for treatment    Nyala Kirchner Kreul, [facility], 10/01/2024  2:53 PM

## 2024-10-01 NOTE — Progress Notes (Signed)
 [employer] testing order entered this am for DPYD and UGT 1A1 prior to initiating new therapy FOLFIRINOX.  Pt had [employer] testing in 08/2022 but that specimen is not eligible for this testing and will need new sample.  Called pt and she is agreeable to mobile phlebotomy from [employer].  Sent email as requested by [employer] to [email or web address] for mobile phlebotomy

## 2024-10-01 NOTE — Progress Notes (Signed)
 This encounter was created in error - please disregard.

## 2024-10-02 ENCOUNTER — Other Ambulatory Visit: Payer: Self-pay

## 2024-10-04 ENCOUNTER — Other Ambulatory Visit: Payer: Self-pay | Admitting: Oncology

## 2024-10-06 ENCOUNTER — Other Ambulatory Visit: Payer: Self-pay | Admitting: *Deleted

## 2024-10-06 ENCOUNTER — Encounter: Payer: Self-pay | Admitting: Nurse Practitioner

## 2024-10-06 ENCOUNTER — Inpatient Hospital Stay: Admitting: Nurse Practitioner

## 2024-10-06 ENCOUNTER — Inpatient Hospital Stay

## 2024-10-06 VITALS — BP 144/60 | HR 105 | Temp 97.8°F | Resp 18 | Ht 66.0 in | Wt 154.4 lb

## 2024-10-06 VITALS — BP 169/88 | HR 79 | Temp 98.2°F | Resp 18

## 2024-10-06 DIAGNOSIS — Z5111 Encounter for antineoplastic chemotherapy: Secondary | ICD-10-CM | POA: Diagnosis not present

## 2024-10-06 DIAGNOSIS — C251 Malignant neoplasm of body of pancreas: Secondary | ICD-10-CM

## 2024-10-06 LAB — CBC WITH DIFFERENTIAL (CANCER CENTER ONLY)
Abs Immature Granulocytes: 0.03 K/uL (ref 0.00–0.07)
Basophils Absolute: 0 K/uL (ref 0.0–0.1)
Basophils Relative: 0 %
Eosinophils Absolute: 0.1 K/uL (ref 0.0–0.5)
Eosinophils Relative: 1 %
HCT: 41.6 % (ref 36.0–46.0)
Hemoglobin: 13.6 g/dL (ref 12.0–15.0)
Immature Granulocytes: 0 %
Lymphocytes Relative: 19 %
Lymphs Abs: 2.2 K/uL (ref 0.7–4.0)
MCH: 29.2 pg (ref 26.0–34.0)
MCHC: 32.7 g/dL (ref 30.0–36.0)
MCV: 89.5 fL (ref 80.0–100.0)
Monocytes Absolute: 0.7 K/uL (ref 0.1–1.0)
Monocytes Relative: 7 %
Neutro Abs: 8.1 K/uL — ABNORMAL HIGH (ref 1.7–7.7)
Neutrophils Relative %: 73 %
Platelet Count: 195 K/uL (ref 150–400)
RBC: 4.65 MIL/uL (ref 3.87–5.11)
RDW: 15.9 % — ABNORMAL HIGH (ref 11.5–15.5)
WBC Count: 11.2 K/uL — ABNORMAL HIGH (ref 4.0–10.5)
nRBC: 0 % (ref 0.0–0.2)

## 2024-10-06 LAB — CMP (CANCER CENTER ONLY)
ALT: 12 U/L (ref 0–44)
AST: 27 U/L (ref 15–41)
Albumin: 3.9 g/dL (ref 3.5–5.0)
Alkaline Phosphatase: 136 U/L — ABNORMAL HIGH (ref 38–126)
Anion gap: 13 (ref 5–15)
BUN: 10 mg/dL (ref 6–20)
CO2: 29 mmol/L (ref 22–32)
Calcium: 10.5 mg/dL — ABNORMAL HIGH (ref 8.9–10.3)
Chloride: 103 mmol/L (ref 98–111)
Creatinine: 0.7 mg/dL (ref 0.44–1.00)
GFR, Estimated: >60 mL/min
Glucose, Bld: 145 mg/dL — ABNORMAL HIGH (ref 70–99)
Potassium: 3 mmol/L — ABNORMAL LOW (ref 3.5–5.1)
Sodium: 145 mmol/L (ref 135–145)
Total Bilirubin: 1.2 mg/dL (ref 0.0–1.2)
Total Protein: 7.4 g/dL (ref 6.5–8.1)

## 2024-10-06 MED ORDER — SODIUM CHLORIDE 0.9 % IV SOLN
2400.0000 mg/m2 | INTRAVENOUS | Status: DC
  Administered 2024-10-06: 4300 mg via INTRAVENOUS
  Filled 2024-10-06: qty 86

## 2024-10-06 MED ORDER — DEXTROSE 5 % IV SOLN: INTRAVENOUS | Status: DC

## 2024-10-06 MED ORDER — DEXAMETHASONE SOD PHOSPHATE PF 10 MG/ML IJ SOLN
10.0000 mg | Freq: Once | INTRAMUSCULAR | Status: AC
  Administered 2024-10-06: 10 mg via INTRAVENOUS
  Filled 2024-10-06: qty 1

## 2024-10-06 MED ORDER — LEUCOVORIN CALCIUM INJECTION 350 MG
400.0000 mg/m2 | Freq: Once | INTRAVENOUS | Status: AC
  Administered 2024-10-06: 720 mg via INTRAVENOUS
  Filled 2024-10-06: qty 36

## 2024-10-06 MED ORDER — OXALIPLATIN CHEMO INJECTION 100 MG/20ML
85.0000 mg/m2 | Freq: Once | INTRAVENOUS | Status: AC
  Administered 2024-10-06: 150 mg via INTRAVENOUS
  Filled 2024-10-06: qty 20

## 2024-10-06 MED ORDER — PALONOSETRON HCL INJECTION 0.25 MG/5ML
0.2500 mg | Freq: Once | INTRAVENOUS | Status: AC
  Administered 2024-10-06: 0.25 mg via INTRAVENOUS
  Filled 2024-10-06: qty 5

## 2024-10-06 NOTE — Patient Instructions (Signed)
 [facility] - A DEPT OF [facility]  Discharge Instructions: Thank you for choosing [facility] to provide your oncology and hematology care.   If you have a lab appointment with the Cancer Center, please go directly to the Cancer Center and check in at the registration area.   Wear comfortable clothing and clothing appropriate for easy access to any Portacath or PICC line.   We strive to give you quality time with your provider. You may need to reschedule your appointment if you arrive late (15 or more minutes).  Arriving late affects you and other patients whose appointments are after yours.  Also, if you miss three or more appointments without notifying the office, you may be dismissed from the clinic at the provider's discretion.      For prescription refill requests, have your pharmacy contact our office and allow 72 hours for refills to be completed.    Today you received the following chemotherapy and/or immunotherapy agents Oxaliplatin, Leucovorin, and Adrucil.     Oxaliplatin Injection What is this medication? OXALIPLATIN (ox AL i PLA tin) treats colorectal cancer. It works by slowing down the growth of cancer cells. This medicine may be used for other purposes; ask your health care provider or pharmacist if you have questions. COMMON BRAND NAME(S): Eloxatin What should I tell my care team before I take this medication? They need to know if you have any of these conditions: Heart disease History of irregular heartbeat or rhythm Liver disease Low blood cell levels (white cells, red cells, and platelets) Lung or breathing disease, such as asthma Take medications that treat or prevent blood clots Tingling of the fingers, toes, or other nerve disorder An unusual or allergic reaction to oxaliplatin, other medications, foods, dyes, or preservatives If you or your partner are pregnant or trying to get pregnant Breast-feeding How should I  use this medication? This medication is injected into a vein. It is given by your care team in a hospital or clinic setting. Talk to your care team about the use of this medication in children. Special care may be needed. Overdosage: If you think you have taken too much of this medicine contact a poison control center or emergency room at once. NOTE: This medicine is only for you. Do not share this medicine with others. What if I miss a dose? Keep appointments for follow-up doses. It is important not to miss a dose. Call your care team if you are unable to keep an appointment. What may interact with this medication? Do not take this medication with any of the following: Cisapride Dronedarone Pimozide Thioridazine This medication may also interact with the following: Aspirin and aspirin-like medications Certain medications that treat or prevent blood clots, such as warfarin, apixaban, dabigatran, and rivaroxaban Cisplatin Cyclosporine Diuretics Medications for infection, such as acyclovir, adefovir, amphotericin B, bacitracin, cidofovir, foscarnet, ganciclovir, gentamicin, pentamidine, vancomycin NSAIDs, medications for pain and inflammation, such as ibuprofen or naproxen Other medications that cause heart rhythm changes Pamidronate Zoledronic acid This list may not describe all possible interactions. Give your health care provider a list of all the medicines, herbs, non-prescription drugs, or dietary supplements you use. Also tell them if you smoke, drink alcohol, or use illegal drugs. Some items may interact with your medicine. What should I watch for while using this medication? Your condition will be monitored carefully while you are receiving this medication. You may need blood work while taking this medication. This medication  may make you feel generally unwell. This is not uncommon as chemotherapy can affect healthy cells as well as cancer cells. Report any side effects. Continue  your course of treatment even though you feel ill unless your care team tells you to stop. This medication may increase your risk of getting an infection. Call your care team for advice if you get a fever, chills, sore throat, or other symptoms of a cold or flu. Do not treat yourself. Try to avoid being around people who are sick. Avoid taking medications that contain aspirin, acetaminophen, ibuprofen, naproxen, or ketoprofen unless instructed by your care team. These medications may hide a fever. Be careful brushing or flossing your teeth or using a toothpick because you may get an infection or bleed more easily. If you have any dental work done, tell your dentist you are receiving this medication. This medication can make you more sensitive to cold. Do not drink cold drinks or use ice. Cover exposed skin before coming in contact with cold temperatures or cold objects. When out in cold weather wear warm clothing and cover your mouth and nose to warm the air that goes into your lungs. Tell your care team if you get sensitive to the cold. Talk to your care team if you or your partner are pregnant or think either of you might be pregnant. This medication can cause serious birth defects if taken during pregnancy and for 9 months after the last dose. A negative pregnancy test is required before starting this medication. A reliable form of contraception is recommended while taking this medication and for 9 months after the last dose. Talk to your care team about effective forms of contraception. Do not father a child while taking this medication and for 6 months after the last dose. Use a condom while having sex during this time period. Do not breastfeed while taking this medication and for 3 months after the last dose. This medication may cause infertility. Talk to your care team if you are concerned about your fertility. What side effects may I notice from receiving this medication? Side effects that you  should report to your care team as soon as possible: Allergic reactions--skin rash, itching, hives, swelling of the face, lips, tongue, or throat Bleeding--bloody or black, tar-like stools, vomiting blood or brown material that looks like coffee grounds, red or dark brown urine, small red or purple spots on skin, unusual bruising or bleeding Dry cough, shortness of breath or trouble breathing Heart rhythm changes--fast or irregular heartbeat, dizziness, feeling faint or lightheaded, chest pain, trouble breathing Infection--fever, chills, cough, sore throat, wounds that don't heal, pain or trouble when passing urine, general feeling of discomfort or being unwell Liver injury--right upper belly pain, loss of appetite, nausea, light-colored stool, dark yellow or brown urine, yellowing skin or eyes, unusual weakness or fatigue Low red blood cell level--unusual weakness or fatigue, dizziness, headache, trouble breathing Muscle injury--unusual weakness or fatigue, muscle pain, dark yellow or brown urine, decrease in amount of urine Pain, tingling, or numbness in the hands or feet Sudden and severe headache, confusion, change in vision, seizures, which may be signs of posterior reversible encephalopathy syndrome (PRES) Unusual bruising or bleeding Side effects that usually do not require medical attention (report to your care team if they continue or are bothersome): Diarrhea Nausea Pain, redness, or swelling with sores inside the mouth or throat Unusual weakness or fatigue Vomiting This list may not describe all possible side effects. Call your doctor for medical  advice about side effects. You may report side effects to FDA at 1-800-FDA-1088. Where should I keep my medication? This medication is given in a hospital or clinic. It will not be stored at home. NOTE: This sheet is a summary. It may not cover all possible information. If you have questions about this medicine, talk to your doctor,  pharmacist, or health care provider.  2024 Elsevier/Gold Standard (2023-08-16 00:00:00)  Leucovorin Injection What is this medication? LEUCOVORIN (loo koe VOR in) prevents side effects from certain medications, such as methotrexate. It works by increasing folate levels. This helps protect healthy cells in your body. It may also be used to treat anemia caused by low levels of folate. It can also be used with fluorouracil, a type of chemotherapy, to treat colorectal cancer. It works by increasing the effects of fluorouracil in the body. This medicine may be used for other purposes; ask your health care provider or pharmacist if you have questions. What should I tell my care team before I take this medication? They need to know if you have any of these conditions: Anemia from low levels of vitamin B12 in the blood An unusual or allergic reaction to leucovorin, folic acid, other medications, foods, dyes, or preservatives Pregnant or trying to get pregnant Breastfeeding How should I use this medication? This medication is injected into a vein or a muscle. It is given by your care team in a hospital or clinic setting. Talk to your care team about the use of this medication in children. Special care may be needed. Overdosage: If you think you have taken too much of this medicine contact a poison control center or emergency room at once. NOTE: This medicine is only for you. Do not share this medicine with others. What if I miss a dose? Keep appointments for follow-up doses. It is important not to miss your dose. Call your care team if you are unable to keep an appointment. What may interact with this medication? Capecitabine Fluorouracil Phenobarbital Phenytoin Primidone Trimethoprim;sulfamethoxazole This list may not describe all possible interactions. Give your health care provider a list of all the medicines, herbs, non-prescription drugs, or dietary supplements you use. Also tell them if you  smoke, drink alcohol, or use illegal drugs. Some items may interact with your medicine. What should I watch for while using this medication? Your condition will be monitored carefully while you are receiving this medication. This medication may increase the side effects of 5-fluorouracil. Tell your care team if you have diarrhea or mouth sores that do not get better or that get worse. What side effects may I notice from receiving this medication? Side effects that you should report to your care team as soon as possible: Allergic reactions--skin rash, itching, hives, swelling of the face, lips, tongue, or throat This list may not describe all possible side effects. Call your doctor for medical advice about side effects. You may report side effects to FDA at 1-800-FDA-1088. Where should I keep my medication? This medication is given in a hospital or clinic. It will not be stored at home. NOTE: This sheet is a summary. It may not cover all possible information. If you have questions about this medicine, talk to your doctor, pharmacist, or health care provider.  2024 Elsevier/Gold Standard (2022-02-06 00:00:00)  Fluorouracil Injection What is this medication? FLUOROURACIL (flure oh YOOR a sil) treats some types of cancer. It works by slowing down the growth of cancer cells. This medicine may be used for other purposes;  ask your health care provider or pharmacist if you have questions. COMMON BRAND NAME(S): Adrucil What should I tell my care team before I take this medication? They need to know if you have any of these conditions: Blood disorders Dihydropyrimidine dehydrogenase (DPD) deficiency Infection, such as chickenpox, cold sores, herpes Kidney disease Liver disease Poor nutrition Recent or ongoing radiation therapy An unusual or allergic reaction to fluorouracil, other medications, foods, dyes, or preservatives If you or your partner are pregnant or trying to get  pregnant Breast-feeding How should I use this medication? This medication is injected into a vein. It is administered by your care team in a hospital or clinic setting. Talk to your care team about the use of this medication in children. Special care may be needed. Overdosage: If you think you have taken too much of this medicine contact a poison control center or emergency room at once. NOTE: This medicine is only for you. Do not share this medicine with others. What if I miss a dose? Keep appointments for follow-up doses. It is important not to miss your dose. Call your care team if you are unable to keep an appointment. What may interact with this medication? Do not take this medication with any of the following: Live virus vaccines This medication may also interact with the following: Medications that treat or prevent blood clots, such as warfarin, enoxaparin, dalteparin This list may not describe all possible interactions. Give your health care provider a list of all the medicines, herbs, non-prescription drugs, or dietary supplements you use. Also tell them if you smoke, drink alcohol, or use illegal drugs. Some items may interact with your medicine. What should I watch for while using this medication? Your condition will be monitored carefully while you are receiving this medication. This medication may make you feel generally unwell. This is not uncommon as chemotherapy can affect healthy cells as well as cancer cells. Report any side effects. Continue your course of treatment even though you feel ill unless your care team tells you to stop. In some cases, you may be given additional medications to help with side effects. Follow all directions for their use. This medication may increase your risk of getting an infection. Call your care team for advice if you get a fever, chills, sore throat, or other symptoms of a cold or flu. Do not treat yourself. Try to avoid being around people who are  sick. This medication may increase your risk to bruise or bleed. Call your care team if you notice any unusual bleeding. Be careful brushing or flossing your teeth or using a toothpick because you may get an infection or bleed more easily. If you have any dental work done, tell your dentist you are receiving this medication. Avoid taking medications that contain aspirin, acetaminophen, ibuprofen, naproxen, or ketoprofen unless instructed by your care team. These medications may hide a fever. Do not treat diarrhea with over the counter products. Contact your care team if you have diarrhea that lasts more than 2 days or if it is severe and watery. This medication can make you more sensitive to the sun. Keep out of the sun. If you cannot avoid being in the sun, wear protective clothing and sunscreen. Do not use sun lamps, tanning beds, or tanning booths. Talk to your care team if you or your partner wish to become pregnant or think you might be pregnant. This medication can cause serious birth defects if taken during pregnancy and for 3 months after  the last dose. A reliable form of contraception is recommended while taking this medication and for 3 months after the last dose. Talk to your care team about effective forms of contraception. Do not father a child while taking this medication and for 3 months after the last dose. Use a condom while having sex during this time period. Do not breastfeed while taking this medication. This medication may cause infertility. Talk to your care team if you are concerned about your fertility. What side effects may I notice from receiving this medication? Side effects that you should report to your care team as soon as possible: Allergic reactions--skin rash, itching, hives, swelling of the face, lips, tongue, or throat Heart attack--pain or tightness in the chest, shoulders, arms, or jaw, nausea, shortness of breath, cold or clammy skin, feeling faint or  lightheaded Heart failure--shortness of breath, swelling of the ankles, feet, or hands, sudden weight gain, unusual weakness or fatigue Heart rhythm changes--fast or irregular heartbeat, dizziness, feeling faint or lightheaded, chest pain, trouble breathing High ammonia level--unusual weakness or fatigue, confusion, loss of appetite, nausea, vomiting, seizures Infection--fever, chills, cough, sore throat, wounds that don't heal, pain or trouble when passing urine, general feeling of discomfort or being unwell Low red blood cell level--unusual weakness or fatigue, dizziness, headache, trouble breathing Pain, tingling, or numbness in the hands or feet, muscle weakness, change in vision, confusion or trouble speaking, loss of balance or coordination, trouble walking, seizures Redness, swelling, and blistering of the skin over hands and feet Severe or prolonged diarrhea Unusual bruising or bleeding Side effects that usually do not require medical attention (report to your care team if they continue or are bothersome): Dry skin Headache Increased tears Nausea Pain, redness, or swelling with sores inside the mouth or throat Sensitivity to light Vomiting This list may not describe all possible side effects. Call your doctor for medical advice about side effects. You may report side effects to FDA at 1-800-FDA-1088. Where should I keep my medication? This medication is given in a hospital or clinic. It will not be stored at home. NOTE: This sheet is a summary. It may not cover all possible information. If you have questions about this medicine, talk to your doctor, pharmacist, or health care provider.  2024 Elsevier/Gold Standard (2022-01-09 00:00:00)   To help prevent nausea and vomiting after your treatment, we encourage you to take your nausea medication as directed.  BELOW ARE SYMPTOMS THAT SHOULD BE REPORTED IMMEDIATELY: *FEVER GREATER THAN 100.4 F (38 C) OR HIGHER *CHILLS OR  SWEATING *NAUSEA AND VOMITING THAT IS NOT CONTROLLED WITH YOUR NAUSEA MEDICATION *UNUSUAL SHORTNESS OF BREATH *UNUSUAL BRUISING OR BLEEDING *URINARY PROBLEMS (pain or burning when urinating, or frequent urination) *BOWEL PROBLEMS (unusual diarrhea, constipation, pain near the anus) TENDERNESS IN MOUTH AND THROAT WITH OR WITHOUT PRESENCE OF ULCERS (sore throat, sores in mouth, or a toothache) UNUSUAL RASH, SWELLING OR PAIN  UNUSUAL VAGINAL DISCHARGE OR ITCHING   Items with * indicate a potential emergency and should be followed up as soon as possible or go to the Emergency Department if any problems should occur.  Please show the CHEMOTHERAPY ALERT CARD or IMMUNOTHERAPY ALERT CARD at check-in to the Emergency Department and triage nurse.  Should you have questions after your visit or need to cancel or reschedule your appointment, please contact [facility]  Dept: [phone]  and follow the prompts.  Office hours are 8:00 a.m. to  4:30 p.m. Monday - Friday. Please note that voicemails left after 4:00 p.m. may not be returned until the following business day.  We are closed weekends and major holidays. You have access to a nurse at all times for urgent questions. Please call the main number to the clinic Dept: [phone] and follow the prompts.   For any non-urgent questions, you may also contact your provider using MyChart. We now offer e-Visits for anyone 54 and older to request care online for non-urgent symptoms. For details visit [email or web address].   Also download the MyChart app! Go to the app store, search "MyChart", open the app, select [place], and log in with your MyChart username and password.

## 2024-10-06 NOTE — Progress Notes (Signed)
 Patient presents today for first time  chemotherapy infusion of Oxaliplatin , leucovorin , and Adrucil . Patient is in satisfactory condition with no new complaints voiced.  Vital signs are stable.  Labs reviewed by Olam Ned during the office visit and all labs are within treatment parameters. Consent signed.  We will proceed with treatment per MD orders.  Infusystem teaching done, video watched, and consent signed. Patient verbalized understanding.  Patient tolerated treatment well with no complaints voiced.  Patient left ambulatory in stable condition.  Vital signs stable at discharge.  Follow up as scheduled.

## 2024-10-06 NOTE — Progress Notes (Signed)
 " [facility] OFFICE PROGRESS NOTE   Diagnosis: Pancreas cancer  INTERVAL HISTORY:   Ms. Krawczyk returns as scheduled.  She is scheduled to begin chemotherapy today with FOLFOX.  She notes improved pain control since beginning MS Contin .  She continues oxycodone  as needed.  She has intermittent nausea, tends to occur when she smells food.  Periodic vomiting.  Bowels moving with the aid of a laxative.  Objective:  Vital signs in last 24 hours:  Blood pressure (!) 144/60, pulse (!) 105, temperature 97.8 F (36.6 C), temperature source Temporal, resp. rate 18, height 5' 6 (1.676 m), weight 154 lb 6.4 oz (70 kg), last menstrual period 02/07/2016, SpO2 100%.    HEENT: White coating over tongue.  No buccal thrush.  Mucous membranes appear moist. Resp: Lungs clear bilaterally. Cardio: Regular rate and rhythm. GI: Tenderness over the left abdomen.  No hepatosplenomegaly.  No mass. Vascular: No leg edema. Skin: Palms dry appearing, right greater than left. Port-A-Cath without erythema.  Lab Results:  Lab Results  Component Value Date   WBC 11.2 (H) 10/06/2024   HGB 13.6 10/06/2024   HCT 41.6 10/06/2024   MCV 89.5 10/06/2024   PLT 195 10/06/2024   NEUTROABS 8.1 (H) 10/06/2024    Imaging:  No results found.  Medications: I have reviewed the patient's current medications.  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 CT abdomen/pelvis 07/30/2022-body/neck pancreas mass with peripheral pancreatic atrophy and duct dilation, suspected encasement of the celiac axis, uterine fibroids MRI abdomen 07/30/2022-3.7 x 4.7 cm pancreas body/tail mass with encasement of the proximal celiac artery, occlusion with collateralization at the confluence of the SMV and portal vein, no evidence of metastatic disease CT chest 08/01/2022-no intrathoracic metastases Elevated CA 19-9 EUS 08/02/2022-gastric polyp, gastric erosions, 32 x 33 mm pancreas body mass, invasion of the celiac trunk,  invasion of the splenoportal confluence, no SMA invasion, 2 malignant appearing nodes in the porta hepatis,T4N1 by endoscopic criteria, FNA biopsy-adenocarcinoma 08/17/2022 [employer]-tumor mutation burden 0.96; MSI high not detected; 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  Stable porta hepatis lymph nodes. Cycle 7 gemcitabine /Abraxane  11/09/2022 Cycle 8 gemcitabine /Abraxane  11/23/2022 Cycle 9 gemcitabine /Abraxane  12/07/2022 Cycle 10 gemcitabine /Abraxane  12/21/2022 Cycle 11 gemcitabine /Abraxane  01/04/2023 Cycle 12 gemcitabine /Abraxane  01/18/2023 Cycle 13 gemcitabine /Abraxane  02/01/2023 Cycle 14 gemcitabine /Abraxane  02/15/2023 CT abdomen/pelvis 02/26/2023-stable appearance of pancreas body mass with vascular encasement and chronic occlusion of the splenic vein, no evidence of metastatic disease Cycle 15 gemcitabine /Abraxane  03/01/2023 Cycle 16 gemcitabine /Abraxane  03/15/2023 Cycle 17 Gemcitabine /Abraxane  03/29/2023 EUS pancreas fiducial marker placement 04/17/2023 Pancreas SBRT 05/07/2023 - 05/17/2023, 33 Gray in 5 fractions CT abdomen/pelvis 09/14/2023: Slight decrease in pancreas body mass with persistent vascular encasement, no evidence of metastatic disease CTs [facility] 01/02/2024-ill-defined hypoattenuating mass in the mid pancreatic body surrounding surgical clips.  Pancreatic duct within the distal pancreatic body and tail is dilated.  Mass encases the celiac trunk.  Common bile duct is dilated with suggestion of focal stenosis in the mid bile duct.  High-grade stenosis/occlusion of the  portal vein.  Prominent vessels in the upper abdomen.  Heterogeneous appearance of the splenic parenchyma with suggestion of an infiltrating hypoattenuating mass/masses versus sequela of splenic infarcts.  Multiple pulmonary nodules in the lung bases concerning for  metastatic disease. CTs chest/abdomen/pelvis 01/06/2024-no PE.  Cardiomegaly with small pericardial effusion.  Small irregular focus of density medial right lung base.  Poorly defined pancreatic body mass measuring 3 x 2.2 cm, previously 3 x 2.5 cm, possible extension of infiltrative soft tissue towards the pancreatic neck region with ill-defined density at the porta hepatis compared to prior.  Slight increased extrahepatic biliary and common duct dilatation compared to prior.  No focal splenic abnormality. Cycle 1 gemcitabine /abraxane  02/07/2024 Cycle 2 gemcitabine /Abraxane  03/18/2024 Cycle 3 gemcitabine /Abraxane  04/01/2024, premed changed from Compazine  to Zofran  Cycle 4 gemcitabine /Abraxane  04/14/2024, premedication changed to Aloxi /Decadron  Cycle 5 gemcitabine /Abraxane  04/29/2024, premedication continued Aloxi /Decadron  Cycle 6 gemcitabine /Abraxane  05/14/2024, premedication continued Aloxi /Decadron  CTs 05/25/2024-stable right middle lobe and right lower lobe pulmonary lesions. No new pulmonary lesions. Decrease in size of pancreatic head/body junction lesion and tumor extending down along the celiac axis. No findings for hepatic metastatic disease.  Cycle 7 gemcitabine /Abraxane  05/27/2024, Gemcitabine  dose reduced due to thrombocytopenia Cycle 8 gemcitabine /Abraxane  06/10/2024 Cycle 9 gemcitabine /Abraxane  06/24/2024 Cycle 10 gemcitabine /Abraxane  07/08/2024 Cycle 11 gemcitabine /Abraxane  07/22/2024 Cycle 12 gemcitabine /Abraxane  08/05/2024 Cycle 13 gemcitabine /Abraxane  08/19/2024 Treatment held 09/02/2024 due to progressive rise of liver enzymes, referred for CTs CTs 09/06/2024-ill-defined hypoattenuating area in the region of the pancreatic head,  grossly similar to the prior study.  Ill-defined linear configuration hypoattenuating area paralleling the main portal vein.  Just inferior to the portal vein near the porta hepatis the area measures 2.0 x 3.4 cm.  Appears increased in size since the prior study.  Slightly irregular opacity in the central middle lobe inseparable from the adjacent pulmonary vessels and bronchus.  Appears grossly unchanged since the prior study. MRI/MRCP 09/09/2024-2.8 x 1.9 cm amorphous hypoenhancing lesion in the head of the pancreas.  Marked progression of biliary dilatation since 05/25/2024 with abrupt cut off of the common bile duct in the head of the pancreas immediately adjacent to the pancreatic head lesion.  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  Nonocclusive thrombus in the SMV as it enters the confluence with the splenic vein.  Splenic vein patency cannot be confirmed.  Loss of fat around the SMA origin suggesting encasement.  21 x 13 mm focus of enhancing soft tissue seen abutting the SMA compatible with metastatic spread.  Marked attenuation of the left renal vein as it crosses anterior to the aorta and may be involved by tumor.  Upper normal lymph nodes in the hepatoduodenal ligament. ERCP 09/11/2024-1 pancreatic stent placed into suspected ventral pancreatic duct ERCP 09/12/2024-single localized biliary stricture in the upper third of the main bile duct, malignant appearing.  Stent placed.  Pancreatic duct stent removed. Cycle 1 FOLFOX 10/06/2024     2.  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  Similar size of of enhancing mass in the dorsal right midbrain effacing the cerebral aqueduct.  No new enhancing lesions identified.  She was transferred to [facility].   She underwent an endoscopic third ventriculostomy on 02/12/2024.   Brain CT 03/13/2024-interval resolution of obstructive hydrocephalus and transependymal edema.  Mild interval decrease in size of a mass within the right midbrain, compatible with glioma.  Chronic encephalomalacia changes in the right frontal lobe. 02/12/2024: Endoscopic ventriculostomy at [facility]   3.  Anorexia/weight loss secondary to #1 4.  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  Abdominal pain 6.  History of colon polyps 7.  Family history of multiple cancers Pancreas cancer maternal aunt, maternal cousin, maternal grandfather Breast cancer-maternal aunt, maternal cousin Prostate cancer-maternal uncle Lung cancer-father 8.  Hypertension 9.  Postmenopausal vaginal bleeding 10.  H. pylori 08/02/2022-amoxicillin  and Flagyl  discontinued 08/21/2022 due to GI symptoms 11.  Syncope event 03/07/2023 12.  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  Anaphylaxis following CT contrast 05/25/2024    Disposition: Ms. Dipinto appears stable.  She notes improved pain control since beginning MS Contin .  She is scheduled to begin treatment today with FOLFOX.  We again reviewed potential toxicities.  She understands we have requested DPYD testing but do not have results available prior to today's treatment.  We reviewed the clinical significance of this.  She agrees to proceed with cycle 1 FOLFOX today as scheduled.  She understands to contact the office with side effects.  CBC and chemistry panel reviewed.  Labs are adequate for treatment.  She has persistent hypokalemia.  She is not taking oral potassium as prescribed.  She will return for follow-up and cycle 2 in 2 weeks.  We are available to see her sooner if needed.  Plan reviewed with Dr. Cloretta.    Olam Ned ANP/GNP-BC   10/06/2024  11:42 AM        "

## 2024-10-07 ENCOUNTER — Telehealth: Payer: Self-pay

## 2024-10-07 ENCOUNTER — Other Ambulatory Visit: Payer: Self-pay

## 2024-10-07 LAB — CANCER ANTIGEN 19-9: CA 19-9: 1324 U/mL — ABNORMAL HIGH (ref 0–35)

## 2024-10-07 NOTE — Telephone Encounter (Signed)
 24 hr call back:  Attempted to call pt to see how she was feeling after 1st time treatment yesterday. Pt did not answer, no option to leave VM. Pt is coming back tomorrow for pump stop appointment.

## 2024-10-08 ENCOUNTER — Inpatient Hospital Stay

## 2024-10-08 DIAGNOSIS — Z5111 Encounter for antineoplastic chemotherapy: Secondary | ICD-10-CM | POA: Diagnosis not present

## 2024-10-08 NOTE — Patient Instructions (Signed)

## 2024-10-08 NOTE — Progress Notes (Signed)
 Pt arrived to facility to get her pump off. Registration messaged staff stating that the pt reported to her that she has been nauseated the past few days since treatment as well as vomiting. Pt reported not being able to keep anything down. This nurse asked if she took any of her at home nausea medicine, in which the pt stated she did but was unsure if it stayed down or not. Pt visibly looked to not feel well. BP was 160/96 and pulse was elevated at 108. This nurse asked the pt if the Nurse practitioner could see her would she want to wait. Pt has already stated previously that she wanted to go home. This nurse gave the pt the right to chose if she wanted to stay and wait or leave. Pt ultimately chose to go home stating that we can call her if they have questions. Pt given extra emesis bags to take with her. NP made aware of pt status.

## 2024-10-10 ENCOUNTER — Other Ambulatory Visit: Payer: Self-pay

## 2024-10-10 ENCOUNTER — Observation Stay (HOSPITAL_COMMUNITY)
Admission: EM | Admit: 2024-10-10 | Discharge: 2024-10-13 | Disposition: A | Attending: Emergency Medicine | Admitting: Emergency Medicine

## 2024-10-10 ENCOUNTER — Encounter (HOSPITAL_COMMUNITY): Payer: Self-pay | Admitting: *Deleted

## 2024-10-10 ENCOUNTER — Emergency Department (HOSPITAL_COMMUNITY)

## 2024-10-10 DIAGNOSIS — Z79899 Other long term (current) drug therapy: Secondary | ICD-10-CM | POA: Diagnosis not present

## 2024-10-10 DIAGNOSIS — Z86011 Personal history of benign neoplasm of the brain: Secondary | ICD-10-CM | POA: Diagnosis not present

## 2024-10-10 DIAGNOSIS — R7303 Prediabetes: Secondary | ICD-10-CM | POA: Diagnosis not present

## 2024-10-10 DIAGNOSIS — F418 Other specified anxiety disorders: Secondary | ICD-10-CM | POA: Insufficient documentation

## 2024-10-10 DIAGNOSIS — R112 Nausea with vomiting, unspecified: Principal | ICD-10-CM | POA: Diagnosis present

## 2024-10-10 DIAGNOSIS — C259 Malignant neoplasm of pancreas, unspecified: Secondary | ICD-10-CM | POA: Diagnosis not present

## 2024-10-10 DIAGNOSIS — R109 Unspecified abdominal pain: Secondary | ICD-10-CM | POA: Diagnosis not present

## 2024-10-10 DIAGNOSIS — F17210 Nicotine dependence, cigarettes, uncomplicated: Secondary | ICD-10-CM | POA: Diagnosis not present

## 2024-10-10 DIAGNOSIS — Z720 Tobacco use: Secondary | ICD-10-CM

## 2024-10-10 DIAGNOSIS — C251 Malignant neoplasm of body of pancreas: Secondary | ICD-10-CM

## 2024-10-10 DIAGNOSIS — F32A Depression, unspecified: Secondary | ICD-10-CM | POA: Diagnosis present

## 2024-10-10 DIAGNOSIS — E870 Hyperosmolality and hypernatremia: Secondary | ICD-10-CM | POA: Diagnosis present

## 2024-10-10 DIAGNOSIS — E785 Hyperlipidemia, unspecified: Secondary | ICD-10-CM | POA: Diagnosis present

## 2024-10-10 DIAGNOSIS — I10 Essential (primary) hypertension: Secondary | ICD-10-CM | POA: Diagnosis not present

## 2024-10-10 DIAGNOSIS — E876 Hypokalemia: Secondary | ICD-10-CM | POA: Diagnosis present

## 2024-10-10 DIAGNOSIS — F17200 Nicotine dependence, unspecified, uncomplicated: Secondary | ICD-10-CM

## 2024-10-10 LAB — URINALYSIS, ROUTINE W REFLEX MICROSCOPIC
Bilirubin Urine: NEGATIVE
Glucose, UA: NEGATIVE mg/dL
Hgb urine dipstick: NEGATIVE
Ketones, ur: NEGATIVE mg/dL
Leukocytes,Ua: NEGATIVE
Nitrite: NEGATIVE
Protein, ur: NEGATIVE mg/dL
Specific Gravity, Urine: 1.018 (ref 1.005–1.030)
pH: 6 (ref 5.0–8.0)

## 2024-10-10 LAB — COMPREHENSIVE METABOLIC PANEL WITH GFR
ALT: 18 U/L (ref 0–44)
AST: 44 U/L — ABNORMAL HIGH (ref 15–41)
Albumin: 3.9 g/dL (ref 3.5–5.0)
Alkaline Phosphatase: 110 U/L (ref 38–126)
Anion gap: 12 (ref 5–15)
BUN: 16 mg/dL (ref 6–20)
CO2: 32 mmol/L (ref 22–32)
Calcium: 10 mg/dL (ref 8.9–10.3)
Chloride: 102 mmol/L (ref 98–111)
Creatinine, Ser: 0.67 mg/dL (ref 0.44–1.00)
GFR, Estimated: >60 mL/min
Glucose, Bld: 103 mg/dL — ABNORMAL HIGH (ref 70–99)
Potassium: 3 mmol/L — ABNORMAL LOW (ref 3.5–5.1)
Sodium: 146 mmol/L — ABNORMAL HIGH (ref 135–145)
Total Bilirubin: 1.7 mg/dL — ABNORMAL HIGH (ref 0.0–1.2)
Total Protein: 7.5 g/dL (ref 6.5–8.1)

## 2024-10-10 LAB — CBC WITH DIFFERENTIAL/PLATELET
Abs Immature Granulocytes: 0.03 10*3/uL (ref 0.00–0.07)
Basophils Absolute: 0 10*3/uL (ref 0.0–0.1)
Basophils Relative: 0 %
Eosinophils Absolute: 0 10*3/uL (ref 0.0–0.5)
Eosinophils Relative: 0 %
HCT: 43.6 % (ref 36.0–46.0)
Hemoglobin: 13.6 g/dL (ref 12.0–15.0)
Immature Granulocytes: 0 %
Lymphocytes Relative: 21 %
Lymphs Abs: 1.9 10*3/uL (ref 0.7–4.0)
MCH: 28.5 pg (ref 26.0–34.0)
MCHC: 31.2 g/dL (ref 30.0–36.0)
MCV: 91.4 fL (ref 80.0–100.0)
Monocytes Absolute: 0.2 10*3/uL (ref 0.1–1.0)
Monocytes Relative: 2 %
Neutro Abs: 6.7 10*3/uL (ref 1.7–7.7)
Neutrophils Relative %: 77 %
Platelets: 189 10*3/uL (ref 150–400)
RBC: 4.77 MIL/uL (ref 3.87–5.11)
RDW: 15.4 % (ref 11.5–15.5)
WBC: 8.8 10*3/uL (ref 4.0–10.5)
nRBC: 0 % (ref 0.0–0.2)

## 2024-10-10 LAB — MAGNESIUM: Magnesium: 2.3 mg/dL (ref 1.7–2.4)

## 2024-10-10 LAB — LIPASE, BLOOD: Lipase: 11 U/L (ref 11–51)

## 2024-10-10 LAB — PHOSPHORUS: Phosphorus: 3 mg/dL (ref 2.5–4.6)

## 2024-10-10 MED ORDER — POTASSIUM CHLORIDE 10 MEQ/100ML IV SOLN
10.0000 meq | INTRAVENOUS | Status: AC
  Administered 2024-10-10 (×2): 10 meq via INTRAVENOUS
  Filled 2024-10-10 (×2): qty 100

## 2024-10-10 MED ORDER — ACETAMINOPHEN 325 MG PO TABS: 650.0000 mg | ORAL_TABLET | Freq: Four times a day (QID) | ORAL | Status: DC | PRN

## 2024-10-10 MED ORDER — HYDRALAZINE HCL 20 MG/ML IJ SOLN
10.0000 mg | INTRAMUSCULAR | Status: DC | PRN
  Administered 2024-10-10: 10 mg via INTRAVENOUS
  Filled 2024-10-10: qty 1

## 2024-10-10 MED ORDER — SODIUM CHLORIDE 0.9% FLUSH
10.0000 mL | Freq: Two times a day (BID) | INTRAVENOUS | Status: DC
  Administered 2024-10-11 – 2024-10-13 (×4): 10 mL

## 2024-10-10 MED ORDER — ONDANSETRON HCL 4 MG PO TABS
4.0000 mg | ORAL_TABLET | Freq: Four times a day (QID) | ORAL | Status: DC | PRN
  Administered 2024-10-11: 4 mg via ORAL
  Filled 2024-10-10: qty 1

## 2024-10-10 MED ORDER — DIPHENHYDRAMINE HCL 50 MG/ML IJ SOLN
25.0000 mg | Freq: Once | INTRAMUSCULAR | Status: AC
  Administered 2024-10-10: 25 mg via INTRAVENOUS
  Filled 2024-10-10: qty 1

## 2024-10-10 MED ORDER — POTASSIUM CHLORIDE IN NACL 20-0.45 MEQ/L-% IV SOLN
INTRAVENOUS | Status: AC
  Filled 2024-10-10 (×3): qty 1000

## 2024-10-10 MED ORDER — PROCHLORPERAZINE EDISYLATE 10 MG/2ML IJ SOLN: 10.0000 mg | Freq: Four times a day (QID) | INTRAMUSCULAR | Status: DC | PRN

## 2024-10-10 MED ORDER — MORPHINE SULFATE ER 15 MG PO TBCR
15.0000 mg | EXTENDED_RELEASE_TABLET | Freq: Two times a day (BID) | ORAL | Status: DC
  Administered 2024-10-10 – 2024-10-13 (×5): 15 mg via ORAL
  Filled 2024-10-10 (×6): qty 1

## 2024-10-10 MED ORDER — MIRTAZAPINE 15 MG PO TABS
7.5000 mg | ORAL_TABLET | Freq: Every day | ORAL | Status: DC
  Administered 2024-10-10 – 2024-10-12 (×3): 7.5 mg via ORAL
  Filled 2024-10-10 (×3): qty 1

## 2024-10-10 MED ORDER — KETOROLAC TROMETHAMINE 30 MG/ML IJ SOLN: 30.0000 mg | Freq: Once | INTRAMUSCULAR | Status: DC

## 2024-10-10 MED ORDER — HYDROMORPHONE HCL 1 MG/ML IJ SOLN
2.0000 mg | Freq: Once | INTRAMUSCULAR | Status: AC
  Administered 2024-10-10: 2 mg via INTRAVENOUS
  Filled 2024-10-10: qty 2

## 2024-10-10 MED ORDER — SODIUM CHLORIDE 0.9 % IV BOLUS
1000.0000 mL | Freq: Once | INTRAVENOUS | Status: AC
  Administered 2024-10-10: 1000 mL via INTRAVENOUS

## 2024-10-10 MED ORDER — CHLORHEXIDINE GLUCONATE CLOTH 2 % EX PADS
6.0000 | MEDICATED_PAD | Freq: Every day | CUTANEOUS | Status: DC
  Administered 2024-10-10 – 2024-10-13 (×2): 6 via TOPICAL

## 2024-10-10 MED ORDER — MAGNESIUM SULFATE IN D5W 1-5 GM/100ML-% IV SOLN
1.0000 g | Freq: Once | INTRAVENOUS | Status: AC
  Administered 2024-10-10: 1 g via INTRAVENOUS
  Filled 2024-10-10: qty 100

## 2024-10-10 MED ORDER — ACETAMINOPHEN 650 MG RE SUPP: 650.0000 mg | Freq: Four times a day (QID) | RECTAL | Status: DC | PRN

## 2024-10-10 MED ORDER — SODIUM CHLORIDE 0.9% FLUSH
3.0000 mL | Freq: Once | INTRAVENOUS | Status: AC
  Administered 2024-10-10: 3 mL via INTRAVENOUS

## 2024-10-10 MED ORDER — HYDROMORPHONE HCL 1 MG/ML IJ SOLN: 1.0000 mg | INTRAMUSCULAR | Status: DC | PRN

## 2024-10-10 MED ORDER — SODIUM CHLORIDE 0.9 % IV SOLN
8.0000 mg | Freq: Once | INTRAVENOUS | Status: AC
  Administered 2024-10-10: 8 mg via INTRAVENOUS
  Filled 2024-10-10: qty 4

## 2024-10-10 MED ORDER — ONDANSETRON HCL 4 MG/2ML IJ SOLN
4.0000 mg | Freq: Once | INTRAMUSCULAR | Status: AC
  Administered 2024-10-10: 4 mg via INTRAVENOUS
  Filled 2024-10-10: qty 2

## 2024-10-10 MED ORDER — ONDANSETRON HCL 4 MG/2ML IJ SOLN
4.0000 mg | Freq: Four times a day (QID) | INTRAMUSCULAR | Status: DC | PRN
  Administered 2024-10-10: 4 mg via INTRAVENOUS
  Filled 2024-10-10 (×2): qty 2

## 2024-10-10 MED ORDER — ENOXAPARIN SODIUM 40 MG/0.4ML IJ SOSY
40.0000 mg | PREFILLED_SYRINGE | INTRAMUSCULAR | Status: DC
  Administered 2024-10-10 – 2024-10-11 (×2): 40 mg via SUBCUTANEOUS
  Filled 2024-10-10 (×2): qty 0.4

## 2024-10-10 MED ORDER — SODIUM CHLORIDE 0.9% FLUSH
10.0000 mL | INTRAVENOUS | Status: DC | PRN
  Administered 2024-10-13: 10 mL

## 2024-10-10 MED ORDER — NITROGLYCERIN 0.4 MG SL SUBL: 0.4000 mg | SUBLINGUAL_TABLET | SUBLINGUAL | Status: DC | PRN

## 2024-10-10 MED ORDER — OXYCODONE HCL 5 MG PO TABS: 5.0000 mg | ORAL_TABLET | Freq: Four times a day (QID) | ORAL | Status: DC | PRN

## 2024-10-10 MED ORDER — AMLODIPINE BESYLATE 5 MG PO TABS
5.0000 mg | ORAL_TABLET | Freq: Every day | ORAL | Status: DC
  Administered 2024-10-10 – 2024-10-13 (×3): 5 mg via ORAL
  Filled 2024-10-10 (×4): qty 1

## 2024-10-10 MED ORDER — POTASSIUM CHLORIDE 10 MEQ/100ML IV SOLN
10.0000 meq | Freq: Once | INTRAVENOUS | Status: AC
  Administered 2024-10-10: 10 meq via INTRAVENOUS
  Filled 2024-10-10: qty 100

## 2024-10-10 NOTE — Plan of Care (Signed)

## 2024-10-10 NOTE — ED Notes (Signed)
 Blood work and medication delay due to pt needing US  IV.

## 2024-10-10 NOTE — ED Triage Notes (Signed)
 BIB [facility] from home for left lower back pain, fatigue and nv s/p pancreatic CA treatment on Thursday. NV and back pain gradually progressively worse. Rates pain 7/10. Denies other sx. VSS. BS = 146. Alert, NAD, calm, interactive.

## 2024-10-10 NOTE — ED Provider Notes (Signed)
 " [place] EMERGENCY DEPARTMENT AT [facility] Provider Note   CSN: [id number] Arrival date & time: 10/10/24  9066     Patient presents with: Emesis   B L Lahm is a 59 y.o. female.    Emesis Associated symptoms: abdominal pain   Presenting to the emergency room with abdominal pain and vomiting.  Patient sister who lives with her is able to provide history.  Patient was recently given a treatment for her pancreatic cancer on Tuesday.  Since then she has had 2-3 bouts of vomiting a day and has not been able to keep anything down.  She has not been able to eat since then.  She also complains of generalized abdominal pain.  Patient denies any chest pain or shortness of breath.    Prior to Admission medications  Medication Sig Start Date End Date Taking? Authorizing Provider  acetaminophen  (TYLENOL ) 325 MG tablet Take by mouth every 6 (six) hours as needed. 02/13/24   [provider]  albuterol  (VENTOLIN  HFA) 108 (90 Base) MCG/ACT inhaler Inhale 2 puffs into the lungs every 6 (six) hours as needed for wheezing or shortness of breath. 02/15/23   Cloretta Arley NOVAK, MD  amLODipine  (NORVASC ) 5 MG tablet Take 1 tablet (5 mg total) by mouth daily. 01/08/24 01/07/25  Tawkaliyar, Roya, DO  [Paused] apixaban  (ELIQUIS ) 5 MG TABS tablet Take 1 tablet (5 mg total) by mouth 2 (two) times daily. Patient not taking: Reported on 09/16/2024 Wait to take this until your doctor or other care provider tells you to start again. 01/08/24   Tawkaliyar, Roya, DO  dexamethasone  (DECADRON ) 4 MG tablet Take 1 tablet (4 mg total) by mouth 2 (two) times daily. Take one tablet twice daily x 3 days after chemo. Start on 06/25/2024 06/24/24   Debby Olam POUR, NP  EPINEPHrine  0.3 mg/0.3 mL IJ SOAJ injection Inject 0.3 mg into the muscle as needed for anaphylaxis. 05/25/24   Dreama Longs, MD  lidocaine -prilocaine  (EMLA ) cream Apply 1 Application topically as needed. 07/08/24   Cloretta Arley NOVAK, MD   lipase/protease/amylase (CREON ) 36000 UNITS CPEP capsule Take 2 capsules (72,000 Units total) by mouth 3 (three) times daily with meals. Patient not taking: Reported on 09/16/2024 01/09/24   Thomas, Lisa K, NP  loperamide (IMODIUM) 2 MG capsule Take 2-4 mg by mouth as needed for diarrhea or loose stools. Maximum 8/day Patient not taking: Reported on 09/16/2024    [provider]  Melatonin 5 MG CAPS Take 1 capsule (5 mg total) by mouth at bedtime. Patient not taking: Reported on 09/16/2024 06/04/24   Homer Shams, MD  mirtazapine  (REMERON ) 7.5 MG tablet Take 1 tablet (7.5 mg total) by mouth at bedtime. 07/10/24 10/08/24  Carrion-Carrero, Shams, MD  morphine  (MS CONTIN ) 15 MG 12 hr tablet Take 1 tablet (15 mg total) by mouth every 12 (twelve) hours. 09/30/24   Debby Olam POUR, NP  nicotine  (NICODERM CQ  - DOSED IN MG/24 HOURS) 14 mg/24hr patch Place 1 patch (14 mg total) onto the skin daily. Patient not taking: Reported on 09/16/2024 07/10/24   Homer Shams, MD  nitroGLYCERIN  (NITROSTAT ) 0.4 MG SL tablet Place 1 tablet (0.4 mg total) under the tongue every 5 (five) minutes as needed for chest pain. 11/01/21 04/11/25  Atway, Rayann N, DO  ondansetron  (ZOFRAN ) 8 MG tablet Take 1 tablet (8 mg total) by mouth every 8 (eight) hours as needed. 01/31/24   Cloretta Arley NOVAK, MD  oxyCODONE  (OXY IR/ROXICODONE ) 5 MG immediate release tablet  Take 1-2 tablets (5-10 mg total) by mouth every 6 (six) hours as needed for severe pain (pain score 7-10) (Reported by patient). 09/16/24   Debby Olam POUR, NP  pantoprazole  (PROTONIX ) 40 MG tablet Take 1 tablet (40 mg total) by mouth daily. Patient not taking: Reported on 09/16/2024 01/29/24   Cloretta Arley NOVAK, MD  potassium chloride  SA (KLOR-CON  M) 20 MEQ tablet Take 1 tablet (20 mEq total) by mouth 2 (two) times daily. 09/22/24   Debby Olam POUR, NP  prochlorperazine  (COMPAZINE ) 10 MG tablet Take 1 tablet (10 mg total) by mouth every 6 (six) hours as  needed. 09/30/24   Debby Olam POUR, NP  RESTASIS 0.05 % ophthalmic emulsion Place 1 drop into both eyes 2 (two) times daily. 05/12/24   [provider]    Allergies: Iohexol     Review of Systems  Constitutional:  Positive for appetite change.  Gastrointestinal:  Positive for abdominal pain, nausea and vomiting.  All other systems reviewed and are negative.   Updated Vital Signs BP (!) 158/73 (BP Location: Right Arm)   Pulse 83   Temp 98.5 F (36.9 C) (Oral)   Resp 18   Wt 69.9 kg   LMP 02/07/2016   SpO2 98%   BMI 24.86 kg/m   Physical Exam Vitals and nursing note reviewed.  HENT:     Mouth/Throat:     Pharynx: Oropharynx is clear.  Cardiovascular:     Rate and Rhythm: Normal rate.     Pulses: Normal pulses.  Pulmonary:     Effort: Pulmonary effort is normal.     Breath sounds: Normal breath sounds.  Abdominal:     General: Abdomen is flat. Bowel sounds are normal. There is no distension.     Palpations: Abdomen is soft. There is no mass.     Tenderness: There is abdominal tenderness. There is no guarding or rebound.     Hernia: No hernia is present.  Skin:    General: Skin is warm and dry.  Neurological:     General: No focal deficit present.     Mental Status: She is alert.     (all labs ordered are listed, but only abnormal results are displayed) Labs Reviewed  LIPASE, BLOOD  COMPREHENSIVE METABOLIC PANEL WITH GFR  URINALYSIS, ROUTINE W REFLEX MICROSCOPIC  CBC WITH DIFFERENTIAL/PLATELET    EKG: None  Radiology: No results found.   Procedures   Medications Ordered in the ED  sodium chloride  flush (NS) 0.9 % injection 3 mL (has no administration in time range)  ondansetron  (ZOFRAN ) injection 4 mg (has no administration in time range)                                    Medical Decision Making Amount and/or Complexity of Data Reviewed Labs: ordered. Radiology: ordered.  Risk Prescription drug management.     Patient presents to  the ED for concern of abdominal pain with nausea and vomiting, this involves an extensive number of treatment options, and is a complaint that carries with it a high risk of complications and morbidity.  The differential diagnosis includes medication side effect, viral gastroenteritis, small bowel obstruction, large bowel obstruction, metastases from pancreatic cancer   Co morbidities that complicate the patient evaluation  Stage IV pancreatic cancer   Additional history obtained:  Additional history obtained from family and patient    External records from outside source obtained  and reviewed including past oncology notes and other outpatient notes.   Lab Tests:  I Ordered, and personally interpreted labs.  The pertinent results include: CBC showed no acute abnormalities.  CMP showed a sodium of 1.6, potassium of 3, AST of 44.  Lipase was 11   Imaging Studies ordered:  I ordered imaging studies including CT of the abdomen and pelvis without contrast I independently visualized and interpreted imaging which showed  Indistinctly marginated hypodense lesion at the pancreatic head and junction  of the pancreatic head and body with adjacent fiducial, subjectively similar to  the prior examination.  2. Pneumobilia with expansile biliary stent traversing the pancreatic head.  3. 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  4. Low-grade central mesenteric edema, not substantially changed.  5. Nonobstructive right renal calculi up to 0.7 cm and multiple nonobstructive  left renal calculi up to 4 mm.  6. Stable scarring and mild nodularity at the lung bases.  7. Systemic atherosclerosis involving the aorta and iliac arteries.  8. Uterine fibroids.  9. Benign calcifications in both breasts.  10. Moderate to prominent degenerative hip arthropathy bilaterally.  11. Grade 1 degenerative  anterolisthesis at L4-5 with lower lumbar spondylosis  and degenerative disc disease.   I agree with the radiologist interpretation   Medicines ordered and prescription drug management:  I ordered medication including Dilaudid  for pain Benadryl  for skin irritation Zofran  for vomiting Sodium chloride  for ration Potassium for hypokalemia Reevaluation of the patient after these medicines showed that the patient improved I have reviewed the patients home medicines and have made adjustments as needed   Test Considered:  Considered a CT with contrast however patient does have a allergy to contrast.  Consultations Obtained:  I requested consultation with the hospitalist Dr. Celinda,  and discussed lab and imaging findings as well as pertinent plan - they recommend: Admission   Problem List / ED Course:  59 year old female presenting to the ED with abdominal pain and vomiting.  Patient been receiving treatment for stage IV pancreatic cancer.  Her last treatment was on Tuesday for which she received a new type of chemo.  Since then she has been having abdominal pain with vomiting and diarrhea.  Her sister who gives history reports that she has not been able to keep anything down.  She is normally prescribed pain medication at home however she is actually receiving benefits of this pain medication due to her vomiting.  She received Benadryl , Zofran , Dilaudid .  After receiving these that she reports feeling a little bit better however is still vomiting and having pain.  Because of her uncontrolled pain and vomiting admission considered.  I talked to the patient and her family and they also agreed that they would like to be admitted.  I spoke to Dr. Celinda who is the admitting doctor and would except her.   Reevaluation:  After the interventions noted above, I reevaluated the patient and found that they have :stayed the same   Dispostion:  After consideration of the diagnostic results and the  patients response to treatment, I feel that the patent would benefit from admission.       Final diagnoses:  None    ED Discharge Orders     None          Rosaline Almarie KANDICE DEVONNA 10/10/24 1356    Patsey Lot, MD 10/10/24 1503  "

## 2024-10-10 NOTE — H&P (Signed)
 " History and Physical    Patient: Kathleen Stone FMW:[id number] DOB: 09-02-1966 DOA: 10/10/2024 DOS: the patient was seen and examined on 10/10/2024 PCP: Harrie Bruckner, DO  Patient coming from: Home  Chief Complaint:  Chief Complaint  Patient presents with   Emesis   HPI: Kathleen Stone is a 59 y.o. female with medical history significant of abnormal stress test, benign brain tumor, DJD of cervical spine, bilateral galactorrhea, microscopic hematuria, hyperlipidemia, hypertension, hypokalemia, nausea and emesis, history of CVA, tobacco abuse history of pancreatic cancer who was admitted last month due to obstructive jaundice who is coming to the emergency department due to left lower back pain, fatigue, nausea, emesis that have been getting progressively worse after she received chemotherapy on Thursday.  No constipation, melena or hematochezia.  No flank pain, dysuria, frequency or hematuria. He denied fever, chills, rhinorrhea, sore throat, wheezing or hemoptysis.  No chest pain, palpitations, diaphoresis, PND, orthopnea or pitting edema of the lower extremities.  No polyuria, polydipsia, polyphagia or blurred vision.   Lab work: CBC, lipase, phosphorus and magnesium  were normal.  CMP showed a sodium 146, potassium 3.0 mmol/L, the rest of the electrolytes and renal function were normal.  Glucose 103  and total bilirubin 1.7 mg/dL.  AST was 44 units/L, the rest of the hepatic functions were normal.  Imaging: CT abdomen/pelvis with contrast shows an indistinctly marginated hypodense lesion at the pancreatic head and junction of the pancreas similar to prior.  There is pneumobilia with a transversing biliary stent transversing the pancreatic head.  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  Low-grade central mesenteric edema that is unchanged.  Nonobstructing nephrolithiasis in the right kidney.  Systemic atherosclerosis of the aortic and iliac arteries.  Uterine  fibroids, moderate to prominent bilateral hip arthritis.  Grade 1 degenerative anterolisthesis at L4-5 with lower lumbar spondylosis and degenerative disc disease.   ED course: Initial vital signs were temperature 98.5 F, pulse 83, respiration 18, BP 150/73 mmHg O2 sat 98% on room air.  She received 1000 mL normal saline bolus, IV KCl replacement and ondansetron  8 mg IVP x 1.  Review of Systems: As mentioned in the history of present illness. All other systems reviewed and are negative. Past Medical History:  Diagnosis Date   Abnormal stress test 01/16/2017   Brain tumor (benign) (HCC)    DJD (degenerative joint disease) of cervical spine    Galactorrhea of both breasts 2009   R. sided w/ benign papilloma excised in 4/09. L sided in 5/09   Hematuria, microscopic 02/24/2020   Hyperlipidemia    Hypertension    Hypokalemia    LOW BACK PAIN SYNDROME 07/10/2007   Qualifier: Diagnosis of   By: Lanis MD, Christopher         Nausea and vomiting 08/01/2022   Pancreatic cancer [facility])    Personal history of adenomatous colonic polyp    09/2007 - diminutive adenoma [facility])   Stroke [facility])    2021- brain tumor bx caused ? stroke-    Tobacco abuse    Unintentional weight loss 05/26/2022   Past Surgical History:  Procedure Laterality Date   APPENDECTOMY     BILIARY STENT PLACEMENT N/A 09/12/2024   Procedure: INSERTION, STENT, BILE DUCT;  Surgeon: Avram Lupita BRAVO, MD;  Location: [facility];  Service: Gastroenterology;  Laterality: N/A;   BIOPSY  08/02/2022   Procedure: BIOPSY;  Surgeon: Wilhelmenia Aloha Raddle., MD;  Location: [facility];  Service: Gastroenterology;;   BIOPSY  04/17/2023   Procedure: BIOPSY;  Surgeon: Wilhelmenia Aloha Raddle., MD;  Location: [facility];  Service: Gastroenterology;;   BRAIN BIOPSY     [id number]   BREAST SURGERY Right 12/2007   central duct excision   CHOLECYSTECTOMY N/A 12/08/2019   Procedure: LAPAROSCOPIC CHOLECYSTECTOMY;  Surgeon: Rubin Calamity, MD;   Location: [facility];  Service: General;  Laterality: N/A;   COLONOSCOPY  2009   COLONOSCOPY  09/14/2020   COLONOSCOPY W/ BIOPSIES AND POLYPECTOMY  09/2007   Dr. Avram. 2 small adenomatous polyps, internal hemorrhoids-Grade 1. Rec  routine colonoscopy at age 38.   COLONOSCOPY W/ BIOPSIES AND POLYPECTOMY     ERCP N/A 09/11/2024   Procedure: ERCP, WITH INTERVENTION IF INDICATED;  Surgeon: Avram Lupita BRAVO, MD;  Location: [facility];  Service: Gastroenterology;  Laterality: N/A;   ERCP N/A 09/12/2024   Procedure: ERCP, WITH INTERVENTION IF INDICATED;  Surgeon: Avram Lupita BRAVO, MD;  Location: [facility];  Service: Gastroenterology;  Laterality: N/A;   ESOPHAGOGASTRODUODENOSCOPY N/A 09/11/2024   Procedure: EGD (ESOPHAGOGASTRODUODENOSCOPY);  Surgeon: Avram Lupita BRAVO, MD;  Location: [facility];  Service: Gastroenterology;  Laterality: N/A;   ESOPHAGOGASTRODUODENOSCOPY (EGD) WITH PROPOFOL  N/A 08/02/2022   Procedure: ESOPHAGOGASTRODUODENOSCOPY (EGD) WITH PROPOFOL ;  Surgeon: Wilhelmenia Aloha Raddle., MD;  Location: [facility];  Service: Gastroenterology;  Laterality: N/A;   ESOPHAGOGASTRODUODENOSCOPY (EGD) WITH PROPOFOL  N/A 04/17/2023   Procedure: ESOPHAGOGASTRODUODENOSCOPY (EGD) WITH PROPOFOL ;  Surgeon: Wilhelmenia Aloha Raddle., MD;  Location: [facility];  Service: Gastroenterology;  Laterality: N/A;   EUS N/A 08/02/2022   Procedure: FULL UPPER ENDOSCOPIC ULTRASOUND (EUS) RADIAL;  Surgeon: Wilhelmenia Aloha Raddle., MD;  Location: [facility];  Service: Gastroenterology;  Laterality: N/A;   EUS N/A 04/17/2023   Procedure: UPPER ENDOSCOPIC ULTRASOUND (EUS) RADIAL;  Surgeon: Wilhelmenia Aloha Raddle., MD;  Location: [facility];  Service: Gastroenterology;  Laterality: N/A;   FIDUCIAL MARKER PLACEMENT N/A 04/17/2023   Procedure: FIDUCIAL MARKER PLACEMENT;  Surgeon: Wilhelmenia Aloha Raddle., MD;  Location: [facility];  Service: Gastroenterology;  Laterality: N/A;   FINE NEEDLE ASPIRATION N/A 08/02/2022    Procedure: FINE NEEDLE ASPIRATION (FNA) LINEAR;  Surgeon: Wilhelmenia Aloha Raddle., MD;  Location: [facility];  Service: Gastroenterology;  Laterality: N/A;   IR IMAGING GUIDED PORT INSERTION  08/03/2022   NASAL SEPTOPLASTY W/ TURBINOPLASTY Bilateral 04/2008   Septoplasty with bilateral inferior turbinate reduction; Dr. Ethyl   POLYPECTOMY  08/02/2022   Procedure: POLYPECTOMY;  Surgeon: Wilhelmenia Aloha Raddle., MD;  Location: [facility];  Service: Gastroenterology;;   SHOULDER OPEN ROTATOR CUFF REPAIR Right     after hurting it by moving heavy objecs   Social History:  reports that she has been smoking cigarettes. She has a 54 pack-year smoking history. She has never used smokeless tobacco. She reports that she does not drink alcohol and does not use drugs.  Allergies[1]  Family History  Problem Relation Age of Onset   Heart failure Mother    Diabetes Mother    Lung cancer Father        dx 84s-80s   Breast cancer Maternal Aunt        d. 104   Pancreatic cancer Maternal Aunt        d. 62   Prostate cancer Maternal Uncle        d. 56   Pancreatic cancer Maternal Grandfather        d. 22s   Breast cancer Cousin        dx 56s; maternal female cousin   Stomach cancer  Cousin        d. 21; maternal female cousin   Pancreatic cancer Cousin        d. 90s; maternal female cousin   Colon cancer Neg Hx    Colon polyps Neg Hx    Esophageal cancer Neg Hx    Rectal cancer Neg Hx     Prior to Admission medications  Medication Sig Start Date End Date Taking? Authorizing Provider  acetaminophen  (TYLENOL ) 325 MG tablet Take by mouth every 6 (six) hours as needed. 02/13/24   [provider]  albuterol  (VENTOLIN  HFA) 108 (90 Base) MCG/ACT inhaler Inhale 2 puffs into the lungs every 6 (six) hours as needed for wheezing or shortness of breath. 02/15/23   Cloretta Arley NOVAK, MD  amLODipine  (NORVASC ) 5 MG tablet Take 1 tablet (5 mg total) by mouth daily. 01/08/24 01/07/25  Tawkaliyar, Roya, DO   [Paused] apixaban  (ELIQUIS ) 5 MG TABS tablet Take 1 tablet (5 mg total) by mouth 2 (two) times daily. Patient not taking: Reported on 09/16/2024 Wait to take this until your doctor or other care provider tells you to start again. 01/08/24   Tawkaliyar, Roya, DO  dexamethasone  (DECADRON ) 4 MG tablet Take 1 tablet (4 mg total) by mouth 2 (two) times daily. Take one tablet twice daily x 3 days after chemo. Start on 06/25/2024 06/24/24   Debby Olam POUR, NP  EPINEPHrine  0.3 mg/0.3 mL IJ SOAJ injection Inject 0.3 mg into the muscle as needed for anaphylaxis. 05/25/24   Dreama Longs, MD  lidocaine -prilocaine  (EMLA ) cream Apply 1 Application topically as needed. 07/08/24   Cloretta Arley NOVAK, MD  lipase/protease/amylase (CREON ) 36000 UNITS CPEP capsule Take 2 capsules (72,000 Units total) by mouth 3 (three) times daily with meals. Patient not taking: Reported on 09/16/2024 01/09/24   Thomas, Lisa K, NP  loperamide (IMODIUM) 2 MG capsule Take 2-4 mg by mouth as needed for diarrhea or loose stools. Maximum 8/day Patient not taking: Reported on 09/16/2024    [provider]  Melatonin 5 MG CAPS Take 1 capsule (5 mg total) by mouth at bedtime. Patient not taking: Reported on 09/16/2024 06/04/24   Homer Shams, MD  mirtazapine  (REMERON ) 7.5 MG tablet Take 1 tablet (7.5 mg total) by mouth at bedtime. 07/10/24 10/08/24  Carrion-Carrero, Shams, MD  morphine  (MS CONTIN ) 15 MG 12 hr tablet Take 1 tablet (15 mg total) by mouth every 12 (twelve) hours. 09/30/24   Debby Olam POUR, NP  nicotine  (NICODERM CQ  - DOSED IN MG/24 HOURS) 14 mg/24hr patch Place 1 patch (14 mg total) onto the skin daily. Patient not taking: Reported on 09/16/2024 07/10/24   Homer Shams, MD  nitroGLYCERIN  (NITROSTAT ) 0.4 MG SL tablet Place 1 tablet (0.4 mg total) under the tongue every 5 (five) minutes as needed for chest pain. 11/01/21 04/11/25  Atway, Rayann N, DO  ondansetron  (ZOFRAN ) 8 MG tablet Take 1 tablet (8 mg  total) by mouth every 8 (eight) hours as needed. 01/31/24   Cloretta Arley NOVAK, MD  oxyCODONE  (OXY IR/ROXICODONE ) 5 MG immediate release tablet Take 1-2 tablets (5-10 mg total) by mouth every 6 (six) hours as needed for severe pain (pain score 7-10) (Reported by patient). 09/16/24   Debby Olam POUR, NP  pantoprazole  (PROTONIX ) 40 MG tablet Take 1 tablet (40 mg total) by mouth daily. Patient not taking: Reported on 09/16/2024 01/29/24   Cloretta Arley NOVAK, MD  potassium chloride  SA (KLOR-CON  M) 20 MEQ tablet Take 1 tablet (20 mEq total) by mouth  2 (two) times daily. 09/22/24   Debby Olam POUR, NP  prochlorperazine  (COMPAZINE ) 10 MG tablet Take 1 tablet (10 mg total) by mouth every 6 (six) hours as needed. 09/30/24   Debby Olam POUR, NP  RESTASIS 0.05 % ophthalmic emulsion Place 1 drop into both eyes 2 (two) times daily. 05/12/24   [provider]    Physical Exam: Vitals:   10/10/24 0945 10/10/24 0946 10/10/24 0950  BP: (!) 158/73    Pulse: 83    Resp: 18    Temp: 98.5 F (36.9 C)    TempSrc: Oral    SpO2: 98% 98%   Weight:   69.9 kg   Physical Exam Vitals reviewed.  Constitutional:      General: She is awake. She is not in acute distress.    Appearance: She is ill-appearing.  HENT:     Head: Normocephalic.     Nose: No rhinorrhea.     Mouth/Throat:     Mouth: Mucous membranes are dry.  Eyes:     General: No scleral icterus.    Pupils: Pupils are equal, round, and reactive to light.  Neck:     Vascular: No JVD.  Cardiovascular:     Rate and Rhythm: Normal rate and regular rhythm.     Heart sounds: S1 normal and S2 normal.  Pulmonary:     Effort: Pulmonary effort is normal.     Breath sounds: Normal breath sounds. No wheezing, rhonchi or rales.  Abdominal:     General: Bowel sounds are normal. There is no distension.     Palpations: Abdomen is soft.     Tenderness: There is abdominal tenderness. There is no right CVA tenderness or left CVA tenderness.  Musculoskeletal:      Cervical back: Neck supple.     Right lower leg: No edema.     Left lower leg: No edema.  Skin:    General: Skin is warm and dry.  Neurological:     General: No focal deficit present.     Mental Status: She is alert and oriented to person, place, and time.  Psychiatric:        Mood and Affect: Mood normal.        Behavior: Behavior normal. Behavior is cooperative.     Data Reviewed:  Results are pending, will review when available.  Assessment and Plan: Principal Problem:   Intractable nausea and vomiting Associated with:   Abdominal pain In the setting of:   Pancreas cancer (HCC) Observation/telemetry. Clear liquid diet. Advance diet as tolerated. Analgesics as needed. Antiemetics as needed. Follow CBC, CMP in AM.   Active Problems:   Hypokalemia Replacing. Magnesium  was supplemented. Will follow potassium in a.m.     Essential hypertension Resume amlodipine  5 mg p.o. daily. May use oral or parenteral antihypertensives as needed.     Hyperlipidemia Statin therapy was held. Follow-up with PCP as an outpatient.     Anxiety and depression Not taking mirtazapine  regularly. Will use again while in the hospital.    Prediabetes Follow fasting glucose.     Advance Care Planning:   Code Status: Full Code   Consults:   Family Communication:   Severity of Illness: The appropriate patient status for this patient is OBSERVATION. Observation status is judged to be reasonable and necessary in order to provide the required intensity of service to ensure the patient's safety. The patient's presenting symptoms, physical exam findings, and initial radiographic and laboratory data in the context of  their medical condition is felt to place them at decreased risk for further clinical deterioration. Furthermore, it is anticipated that the patient will be medically stable for discharge from the hospital within 2 midnights of admission.   Author: Alm Dorn Castor,  MD 10/10/2024 1:29 PM  For on call review [email or web address].   This document was prepared using Dragon voice recognition software and may contain some unintended transcription errors.      [1]  Allergies Allergen Reactions   Iohexol  Anaphylaxis, Hives and Other (See Comments)    05/25/24 - Pt given contrast for CT after 13 hr prep, had reaction and needed care in ER per MD evaluation.    "

## 2024-10-11 DIAGNOSIS — R112 Nausea with vomiting, unspecified: Secondary | ICD-10-CM | POA: Diagnosis not present

## 2024-10-11 LAB — CBC
HCT: 36 % (ref 36.0–46.0)
Hemoglobin: 11.4 g/dL — ABNORMAL LOW (ref 12.0–15.0)
MCH: 29 pg (ref 26.0–34.0)
MCHC: 31.7 g/dL (ref 30.0–36.0)
MCV: 91.6 fL (ref 80.0–100.0)
Platelets: 131 10*3/uL — ABNORMAL LOW (ref 150–400)
RBC: 3.93 MIL/uL (ref 3.87–5.11)
RDW: 15.4 % (ref 11.5–15.5)
WBC: 7.1 10*3/uL (ref 4.0–10.5)
nRBC: 0 % (ref 0.0–0.2)

## 2024-10-11 LAB — COMPREHENSIVE METABOLIC PANEL WITH GFR
ALT: 17 U/L (ref 0–44)
AST: 41 U/L (ref 15–41)
Albumin: 3.3 g/dL — ABNORMAL LOW (ref 3.5–5.0)
Alkaline Phosphatase: 88 U/L (ref 38–126)
Anion gap: 8 (ref 5–15)
BUN: 13 mg/dL (ref 6–20)
CO2: 28 mmol/L (ref 22–32)
Calcium: 9 mg/dL (ref 8.9–10.3)
Chloride: 108 mmol/L (ref 98–111)
Creatinine, Ser: 0.69 mg/dL (ref 0.44–1.00)
GFR, Estimated: >60 mL/min
Glucose, Bld: 85 mg/dL (ref 70–99)
Potassium: 3.2 mmol/L — ABNORMAL LOW (ref 3.5–5.1)
Sodium: 144 mmol/L (ref 135–145)
Total Bilirubin: 1.4 mg/dL — ABNORMAL HIGH (ref 0.0–1.2)
Total Protein: 6.1 g/dL — ABNORMAL LOW (ref 6.5–8.1)

## 2024-10-11 MED ORDER — POTASSIUM CHLORIDE CRYS ER 20 MEQ PO TBCR
40.0000 meq | EXTENDED_RELEASE_TABLET | Freq: Once | ORAL | Status: AC
  Administered 2024-10-11: 40 meq via ORAL
  Filled 2024-10-11: qty 2

## 2024-10-11 NOTE — Plan of Care (Signed)

## 2024-10-11 NOTE — Progress Notes (Signed)
 " PROGRESS NOTE    [name]  FMW:[id number] DOB: 1966/07/25 DOA: 10/10/2024 PCP: Harrie Bruckner, DO   Brief Narrative:  Kathleen Stone is a 59 y.o. female with medical history significant of abnormal stress test, benign brain tumor, DJD of cervical spine, bilateral galactorrhea, microscopic hematuria, hyperlipidemia, hypertension, hypokalemia, nausea and emesis, history of CVA, tobacco abuse history of pancreatic cancer who was admitted last month due to obstructive jaundice - now presenting with intractable nausea and vomiting over the past few days.    Assessment & Plan:   Principal Problem:   Intractable nausea and vomiting Active Problems:   Essential hypertension   Hyperlipidemia   Anxiety and depression   Hypokalemia   Pancreas cancer (HCC)   Abdominal pain   Prediabetes   Hypernatremia  Intractable nausea and vomiting Abdominal pain, resolved Pancreatic cancer currently being treated with chemotherapy - Recently received oxaliplatin , leucovorin , and Adrucil .  - Patient indicates abdominal pain and some nausea since biliary stent placement on 09/12/24 - Advance diet as tolerated, trial regular diet at lunch today - IV narcotics discontinued, patient has not utilized prn IV or p.o. narcotics in 24 hours -continues on chronic MS Contin    Hypokalemia Likely secondary to poor p.o. intake, additional potassium given today, follow repeat labs   Essential hypertension Continue amlodipine    Hyperlipidemia Statin therapy was held. Follow-up with PCP as an outpatient.   Anxiety and depression Not taking mirtazapine  regularly -continue as needed   Prediabetes Currently well-controlled  DVT prophylaxis: enoxaparin  (LOVENOX ) injection 40 mg Start: 10/10/24 2200 Code Status:   Code Status: Full Code Family Communication: None present  Status is: Inpatient  Dispo: The patient is from: Home              Anticipated d/c is to: Home              Anticipated  d/c date is: 24 to 48 hours              Patient currently not medically stable for discharge  Consultants:  None  Procedures:  None  Antimicrobials:  None indicated  Subjective: No acute issues or events overnight, nausea improving but not yet resolved somewhat anxious about advancing diet today  Objective: Vitals:   10/10/24 1730 10/10/24 1952 10/11/24 0026 10/11/24 0447  BP: (!) 169/91 (!) 170/58 (!) 144/88 (!) 147/57  Pulse:  79 100   Resp:  16 18 16   Temp:  97.7 F (36.5 C) 98.7 F (37.1 C) 98.2 F (36.8 C)  TempSrc:  Oral Oral Oral  SpO2:  98% 95%   Weight: 69.9 kg     Height: 5' 6 (1.676 m)       Intake/Output Summary (Last 24 hours) at 10/11/2024 0714 Last data filed at 10/10/2024 2000 Gross per 24 hour  Intake 1369.72 ml  Output 200 ml  Net 1169.72 ml   Filed Weights   10/10/24 0950 10/10/24 1730  Weight: 69.9 kg 69.9 kg    Examination:  General:  Pleasantly resting in bed, No acute distress. HEENT:  Normocephalic atraumatic.  Sclerae nonicteric, noninjected.  Extraocular movements intact bilaterally. Neck:  Without mass or deformity.  Trachea is midline. Lungs:  Clear to auscultate bilaterally without rhonchi, wheeze, or rales. Heart:  Regular rate and rhythm.  Without murmurs, rubs, or gallops. Abdomen:  Soft, minimally tender midepigastrium without rebound or guarding Extremities: Without cyanosis, clubbing, edema, or obvious deformity. Skin:  Warm and dry, no erythema.   Data Reviewed:  I have personally reviewed following labs and imaging studies  CBC: Recent Labs  Lab 10/06/24 1051 10/10/24 1156 10/11/24 0430  WBC 11.2* 8.8 7.1  NEUTROABS 8.1* 6.7  --   HGB 13.6 13.6 11.4*  HCT 41.6 43.6 36.0  MCV 89.5 91.4 91.6  PLT 195 189 131*   Basic Metabolic Panel: Recent Labs  Lab 10/06/24 1051 10/10/24 1156 10/11/24 0430  NA 145 146* 144  K 3.0* 3.0* 3.2*  CL 103 102 108  CO2 29 32 28  GLUCOSE 145* 103* 85  BUN 10 16 13   CREATININE  0.70 0.67 0.69  CALCIUM  10.5* 10.0 9.0  MG  --  2.3  --   PHOS  --  3.0  --    GFR: Estimated Creatinine Clearance: 70 mL/min (by C-G formula based on SCr of 0.69 mg/dL). Liver Function Tests: Recent Labs  Lab 10/06/24 1051 10/10/24 1156 10/11/24 0430  AST 27 44* 41  ALT 12 18 17   ALKPHOS 136* 110 88  BILITOT 1.2 1.7* 1.4*  PROT 7.4 7.5 6.1*  ALBUMIN 3.9 3.9 3.3*   Recent Labs  Lab 10/10/24 1156  LIPASE 11   No results found for this or any previous visit (from the past 240 hours).   Radiology Studies: CT ABDOMEN PELVIS WO CONTRAST Result Date: 10/10/2024 EXAM: CT ABDOMEN AND PELVIS WITHOUT CONTRAST 10/10/2024 10:35:39 AM TECHNIQUE: CT of the abdomen and pelvis was performed without the administration of intravenous contrast. Multiplanar reformatted images are provided for review. Automated exposure control, iterative reconstruction, and/or weight-based adjustment of the mA/kV was utilized to reduce the radiation dose to as low as reasonably achievable. COMPARISON: Comparison radiographs 09/22/2024 and 09/06/2024. CLINICAL HISTORY: Pancreatic cancer, abdominal pain over the last 4 days. * Tracking Code: BO * FINDINGS: LOWER CHEST: Stable scarring and mild nodularity at the lung bases. 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 GALLBLADDER AND BILE DUCTS: Pneumobilia with expansile biliary stent in place traversing the pancreatic head. SPLEEN: No acute abnormality. PANCREAS: Indistinctly marginated hypodense lesion of the pancreatic head and junction of the pancreatic head and body with adjacent marginal fiducial on image 24 series 2. Measurements of this pancreatic mass are not feasible due to indistinct margination on today's noncontrast examination but the appearance is roughly similar to the 09/06/2024  examination. ADRENAL GLANDS: No acute abnormality. KIDNEYS, URETERS AND BLADDER: Nonobstructive right renal calculi measure up to 0.7 cm in length. 6 nonobstructive left renal calculi measure up to 4 mm in length. No hydronephrosis. No perinephric or periureteral stranding. Urinary bladder is unremarkable. GI AND BOWEL: Stomach demonstrates no acute abnormality. There is no bowel obstruction. PERITONEUM AND RETROPERITONEUM: Low grade central mesenteric edema not substantially changed from prior. No ascites. No free air. VASCULATURE: Aorta is normal in caliber. Systemic atherosclerosis is present, including the aorta and iliac arteries. LYMPH NODES: No lymphadenopathy. REPRODUCTIVE ORGANS: Benign uterine fibroids. BONES AND SOFT TISSUES: Moderate to prominent degenerative hip arthropathy bilaterally. 3 mm grade 1 degenerative anterolisthesis at L4-L5. Lower lumbar spondylosis and degenerative disc disease. Benign calcifications in both breasts. No acute osseous abnormality. No focal soft tissue abnormality. IMPRESSION: 1. Indistinctly marginated hypodense lesion at the pancreatic head and junction of the pancreatic head and body with adjacent fiducial, subjectively similar to the prior examination. 2. Pneumobilia with expansile biliary stent traversing the pancreatic head. 3. Extensive collateral vessels along the  peripancreatic and gastrohepatic ligament region, likely secondary to known portal vein occlusion . 4. Low-grade central mesenteric edema, not substantially changed. 5. Nonobstructive right renal calculi up to 0.7 cm and multiple nonobstructive left renal calculi up to 4 mm. 6. Stable scarring and mild nodularity at the lung bases. 7. Systemic atherosclerosis involving the aorta and iliac arteries. 8. Uterine fibroids. 9. Benign calcifications in both breasts. 10. Moderate to prominent degenerative hip arthropathy bilaterally. 11. Grade 1 degenerative anterolisthesis at L4-5 with lower lumbar spondylosis  and degenerative disc disease. Electronically signed by: Ryan Salvage MD 10/10/2024 10:51 AM EST RP Workstation: [id number]   Scheduled Meds:  amLODipine   5 mg Oral Daily   Chlorhexidine  Gluconate Cloth  6 each Topical Daily   enoxaparin  (LOVENOX ) injection  40 mg Subcutaneous Q24H   mirtazapine   7.5 mg Oral QHS   morphine   15 mg Oral Q12H   sodium chloride  flush  10-40 mL Intracatheter Q12H   Continuous Infusions:  0.45 % NaCl with KCl 20 mEq / L 100 mL/hr at 10/11/24 0527     LOS: 0 days   Time spent:  Elsie JAYSON Montclair, DO [facility]  If 7PM-7AM, please contact night-coverage [email or web address]  10/11/2024, 7:14 AM      "

## 2024-10-12 DIAGNOSIS — R112 Nausea with vomiting, unspecified: Secondary | ICD-10-CM | POA: Diagnosis not present

## 2024-10-12 LAB — BASIC METABOLIC PANEL WITH GFR
Anion gap: 11 (ref 5–15)
BUN: 10 mg/dL (ref 6–20)
CO2: 26 mmol/L (ref 22–32)
Calcium: 8.9 mg/dL (ref 8.9–10.3)
Chloride: 103 mmol/L (ref 98–111)
Creatinine, Ser: 0.62 mg/dL (ref 0.44–1.00)
GFR, Estimated: >60 mL/min
Glucose, Bld: 84 mg/dL (ref 70–99)
Potassium: 2.9 mmol/L — ABNORMAL LOW (ref 3.5–5.1)
Sodium: 140 mmol/L (ref 135–145)

## 2024-10-12 MED ORDER — POTASSIUM CHLORIDE CRYS ER 20 MEQ PO TBCR
40.0000 meq | EXTENDED_RELEASE_TABLET | ORAL | Status: AC
  Administered 2024-10-12 (×2): 40 meq via ORAL
  Filled 2024-10-12 (×3): qty 2

## 2024-10-12 MED ORDER — POLYETHYLENE GLYCOL 3350 17 G PO PACK
17.0000 g | PACK | Freq: Two times a day (BID) | ORAL | Status: DC
  Administered 2024-10-13: 17 g via ORAL
  Filled 2024-10-12 (×3): qty 1

## 2024-10-12 MED ORDER — SENNOSIDES-DOCUSATE SODIUM 8.6-50 MG PO TABS
1.0000 | ORAL_TABLET | Freq: Two times a day (BID) | ORAL | Status: DC
  Administered 2024-10-12: 1 via ORAL
  Filled 2024-10-12 (×3): qty 1

## 2024-10-12 MED ORDER — APIXABAN 5 MG PO TABS
5.0000 mg | ORAL_TABLET | Freq: Two times a day (BID) | ORAL | Status: DC
  Administered 2024-10-12 – 2024-10-13 (×2): 5 mg via ORAL
  Filled 2024-10-12 (×3): qty 1

## 2024-10-12 NOTE — Plan of Care (Signed)

## 2024-10-12 NOTE — Progress Notes (Signed)
 " PROGRESS NOTE    Kathleen Stone  FMW:[id number] DOB: March 12, 1966 DOA: 10/10/2024 PCP: Harrie Bruckner, DO   Brief Narrative:  Kathleen Stone is a 59 y.o. female with medical history significant of abnormal stress test, benign brain tumor, DJD of cervical spine, bilateral galactorrhea, microscopic hematuria, hyperlipidemia, hypertension, hypokalemia, nausea and emesis, history of CVA, tobacco abuse history of pancreatic cancer who was admitted last month due to obstructive jaundice - now presenting with intractable nausea and vomiting over the past few days.    Assessment & Plan:   Principal Problem:   Intractable nausea and vomiting Active Problems:   Essential hypertension   Hyperlipidemia   Anxiety and depression   Hypokalemia   Pancreas cancer (HCC)   Abdominal pain   Prediabetes   Hypernatremia  Intractable nausea and vomiting Abdominal pain, resolved Pancreatic cancer currently being treated with chemotherapy - Recently received oxaliplatin , leucovorin , and Adrucil .  - Patient indicates abdominal pain and some nausea since biliary stent placement on 09/12/24 - Advance diet as tolerated, trial regular diet at lunch today - IV narcotics discontinued, patient has not utilized prn IV or p.o. narcotics in 24 hours -continues on chronic MS Contin    Hypokalemia Likely secondary to poor p.o. intake, additional potassium given today, follow repeat labs  Essential hypertension Continue amlodipine    Hyperlipidemia Statin therapy was held. Follow-up with PCP as an outpatient.   Anxiety and depression Not taking mirtazapine  regularly -continue as needed   Prediabetes Currently well-controlled  DVT prophylaxis: enoxaparin  (LOVENOX ) injection 40 mg Start: 10/10/24 2200 Code Status:   Code Status: Full Code Family Communication: None present  Status is: Inpatient  Dispo: The patient is from: Home              Anticipated d/c is to: Home              Anticipated  d/c date is: 24 to 48 hours              Patient currently not medically stable for discharge  Consultants:  None  Procedures:  None  Antimicrobials:  None indicated  Subjective: No acute issues or events overnight, nausea improving but not yet resolved -tolerating p.o. moderately well but still having profound symptoms per report  Objective: Vitals:   10/11/24 0026 10/11/24 0447 10/11/24 1336 10/11/24 2247  BP: (!) 144/88 (!) 147/57 (!) 165/86 (!) 164/94  Pulse: 100  73 78  Resp: 18 16 16 18   Temp: 98.7 F (37.1 C) 98.2 F (36.8 C) 98.4 F (36.9 C) 98.1 F (36.7 C)  TempSrc: Oral Oral Oral Oral  SpO2: 95%  100% 100%  Weight:      Height:        Intake/Output Summary (Last 24 hours) at 10/12/2024 0735 Last data filed at 10/11/2024 1700 Gross per 24 hour  Intake 0 ml  Output --  Net 0 ml   Filed Weights   10/10/24 0950 10/10/24 1730  Weight: 69.9 kg 69.9 kg    Examination:  General:  Pleasantly resting in bed, No acute distress. HEENT:  Normocephalic atraumatic.  Sclerae nonicteric, noninjected.  Extraocular movements intact bilaterally. Neck:  Without mass or deformity.  Trachea is midline. Lungs:  Clear to auscultate bilaterally without rhonchi, wheeze, or rales. Heart:  Regular rate and rhythm.  Without murmurs, rubs, or gallops. Abdomen:  Soft, minimally tender midepigastrium without rebound or guarding Extremities: Without cyanosis, clubbing, edema, or obvious deformity. Skin:  Warm and dry, no erythema.  Data Reviewed: I have personally reviewed following labs and imaging studies  CBC: Recent Labs  Lab 10/06/24 1051 10/10/24 1156 10/11/24 0430  WBC 11.2* 8.8 7.1  NEUTROABS 8.1* 6.7  --   HGB 13.6 13.6 11.4*  HCT 41.6 43.6 36.0  MCV 89.5 91.4 91.6  PLT 195 189 131*   Basic Metabolic Panel: Recent Labs  Lab 10/06/24 1051 10/10/24 1156 10/11/24 0430 10/12/24 0509  NA 145 146* 144 140  K 3.0* 3.0* 3.2* 2.9*  CL 103 102 108 103  CO2 29  32 28 26  GLUCOSE 145* 103* 85 84  BUN 10 16 13 10   CREATININE 0.70 0.67 0.69 0.62  CALCIUM  10.5* 10.0 9.0 8.9  MG  --  2.3  --   --   PHOS  --  3.0  --   --    GFR: Estimated Creatinine Clearance: 70 mL/min (by C-G formula based on SCr of 0.62 mg/dL). Liver Function Tests: Recent Labs  Lab 10/06/24 1051 10/10/24 1156 10/11/24 0430  AST 27 44* 41  ALT 12 18 17   ALKPHOS 136* 110 88  BILITOT 1.2 1.7* 1.4*  PROT 7.4 7.5 6.1*  ALBUMIN 3.9 3.9 3.3*   Recent Labs  Lab 10/10/24 1156  LIPASE 11   No results found for this or any previous visit (from the past 240 hours).   Radiology Studies: CT ABDOMEN PELVIS WO CONTRAST Result Date: 10/10/2024 EXAM: CT ABDOMEN AND PELVIS WITHOUT CONTRAST 10/10/2024 10:35:39 AM TECHNIQUE: CT of the abdomen and pelvis was performed without the administration of intravenous contrast. Multiplanar reformatted images are provided for review. Automated exposure control, iterative reconstruction, and/or weight-based adjustment of the mA/kV was utilized to reduce the radiation dose to as low as reasonably achievable. COMPARISON: Comparison radiographs 09/22/2024 and 09/06/2024. CLINICAL HISTORY: Pancreatic cancer, abdominal pain over the last 4 days. * Tracking Code: BO * FINDINGS: LOWER CHEST: Stable scarring and mild nodularity at the lung bases. 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 GALLBLADDER AND BILE DUCTS: Pneumobilia with expansile biliary stent in place traversing the pancreatic head. SPLEEN: No acute abnormality. PANCREAS: Indistinctly marginated hypodense lesion of the pancreatic head and junction of the pancreatic head and body with adjacent marginal fiducial on image 24 series 2. Measurements of this pancreatic mass are not feasible due to indistinct margination on today's  noncontrast examination but the appearance is roughly similar to the 09/06/2024 examination. ADRENAL GLANDS: No acute abnormality. KIDNEYS, URETERS AND BLADDER: Nonobstructive right renal calculi measure up to 0.7 cm in length. 6 nonobstructive left renal calculi measure up to 4 mm in length. No hydronephrosis. No perinephric or periureteral stranding. Urinary bladder is unremarkable. GI AND BOWEL: Stomach demonstrates no acute abnormality. There is no bowel obstruction. PERITONEUM AND RETROPERITONEUM: Low grade central mesenteric edema not substantially changed from prior. No ascites. No free air. VASCULATURE: Aorta is normal in caliber. Systemic atherosclerosis is present, including the aorta and iliac arteries. LYMPH NODES: No lymphadenopathy. REPRODUCTIVE ORGANS: Benign uterine fibroids. BONES AND SOFT TISSUES: Moderate to prominent degenerative hip arthropathy bilaterally. 3 mm grade 1 degenerative anterolisthesis at L4-L5. Lower lumbar spondylosis and degenerative disc disease. Benign calcifications in both breasts. No acute osseous abnormality. No focal soft tissue abnormality. IMPRESSION: 1. Indistinctly marginated hypodense lesion at the pancreatic head and junction of the pancreatic head and body with adjacent fiducial, subjectively similar to the  prior examination. 2. Pneumobilia with expansile biliary stent traversing the pancreatic head. 3. 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 4. Low-grade central mesenteric edema, not substantially changed. 5. Nonobstructive right renal calculi up to 0.7 cm and multiple nonobstructive left renal calculi up to 4 mm. 6. Stable scarring and mild nodularity at the lung bases. 7. Systemic atherosclerosis involving the aorta and iliac arteries. 8. Uterine fibroids. 9. Benign calcifications in both breasts. 10. Moderate to prominent degenerative hip arthropathy bilaterally.  11. Grade 1 degenerative anterolisthesis at L4-5 with lower lumbar spondylosis and degenerative disc disease. Electronically signed by: Ryan Salvage MD 10/10/2024 10:51 AM EST RP Workstation: [id number]   Scheduled Meds:  amLODipine   5 mg Oral Daily   Chlorhexidine  Gluconate Cloth  6 each Topical Daily   enoxaparin  (LOVENOX ) injection  40 mg Subcutaneous Q24H   mirtazapine   7.5 mg Oral QHS   morphine   15 mg Oral Q12H   sodium chloride  flush  10-40 mL Intracatheter Q12H   Continuous Infusions:     LOS: 0 days   Time spent:  Elsie JAYSON Montclair, DO [facility]  If 7PM-7AM, please contact night-coverage [email or web address]  10/12/2024, 7:35 AM      "

## 2024-10-12 NOTE — Plan of Care (Signed)
" °  Problem: Education: Goal: Knowledge of General Education information will improve Description: Including pain rating scale, medication(s)/side effects and non-pharmacologic comfort measures Outcome: Progressing   Problem: Clinical Measurements: Goal: Will remain free from infection Outcome: Progressing Goal: Diagnostic test results will improve Outcome: Progressing Goal: Cardiovascular complication will be avoided Outcome: Progressing   Problem: Activity: Goal: Risk for activity intolerance will decrease Outcome: Progressing   Problem: Coping: Goal: Level of anxiety will decrease Outcome: Progressing   Problem: Elimination: Goal: Will not experience complications related to bowel motility Outcome: Progressing   "

## 2024-10-12 NOTE — Progress Notes (Signed)
 Pt refused vitals for morning medications, also refused morning meds, MD Lue notified

## 2024-10-13 LAB — BASIC METABOLIC PANEL WITH GFR
Anion gap: 7 (ref 5–15)
BUN: 11 mg/dL (ref 6–20)
CO2: 30 mmol/L (ref 22–32)
Calcium: 9.2 mg/dL (ref 8.9–10.3)
Chloride: 103 mmol/L (ref 98–111)
Creatinine, Ser: 0.68 mg/dL (ref 0.44–1.00)
GFR, Estimated: >60 mL/min
Glucose, Bld: 114 mg/dL — ABNORMAL HIGH (ref 70–99)
Potassium: 3.2 mmol/L — ABNORMAL LOW (ref 3.5–5.1)
Sodium: 140 mmol/L (ref 135–145)

## 2024-10-13 MED ORDER — SENNOSIDES-DOCUSATE SODIUM 8.6-50 MG PO TABS: 1.0000 | ORAL_TABLET | Freq: Two times a day (BID) | ORAL | 0 refills | Status: AC

## 2024-10-13 MED ORDER — ORAL CARE MOUTH RINSE: 15.0000 mL | OROMUCOSAL | Status: DC | PRN

## 2024-10-13 MED ORDER — POLYETHYLENE GLYCOL 3350 17 G PO PACK: 17.0000 g | PACK | Freq: Two times a day (BID) | ORAL | 0 refills | Status: AC

## 2024-10-13 MED ORDER — POTASSIUM CHLORIDE CRYS ER 20 MEQ PO TBCR
20.0000 meq | EXTENDED_RELEASE_TABLET | Freq: Two times a day (BID) | ORAL | Status: DC
  Administered 2024-10-13: 20 meq via ORAL
  Filled 2024-10-13: qty 1

## 2024-10-13 MED ORDER — ONDANSETRON 4 MG PO TBDP: 4.0000 mg | ORAL_TABLET | Freq: Three times a day (TID) | ORAL | 0 refills | Status: AC | PRN

## 2024-10-13 MED ORDER — AMLODIPINE BESYLATE 5 MG PO TABS: 5.0000 mg | ORAL_TABLET | Freq: Every day | ORAL | 0 refills | Status: AC

## 2024-10-13 MED ORDER — POTASSIUM CHLORIDE CRYS ER 20 MEQ PO TBCR: 30.0000 meq | EXTENDED_RELEASE_TABLET | Freq: Two times a day (BID) | ORAL | 0 refills | Status: AC

## 2024-10-13 MED ORDER — ACETAMINOPHEN 325 MG PO TABS: 650.0000 mg | ORAL_TABLET | Freq: Four times a day (QID) | ORAL | 0 refills | Status: AC | PRN

## 2024-10-13 MED ORDER — OMEPRAZOLE MAGNESIUM 20 MG PO TBEC: 20.0000 mg | DELAYED_RELEASE_TABLET | Freq: Two times a day (BID) | ORAL | 0 refills | Status: AC

## 2024-10-13 MED ORDER — APIXABAN 5 MG PO TABS: 5.0000 mg | ORAL_TABLET | Freq: Two times a day (BID) | ORAL | 0 refills | Status: AC

## 2024-10-13 MED ORDER — HEPARIN SOD (PORK) LOCK FLUSH 100 UNIT/ML IV SOLN
INTRAVENOUS | Status: AC
  Administered 2024-10-13: 500 [IU]
  Filled 2024-10-13: qty 5

## 2024-10-13 NOTE — Discharge Summary (Signed)
 Physician Discharge Summary  [name]:[id number] DOB: [birth year] DOA: 10/10/2024  PCP: Harrie Bruckner, DO  Admit date: 10/10/2024 Discharge date: 10/13/2024  Admitted From: Home Disposition: Home  Recommendations for Outpatient Follow-up:  Follow up with PCP in 1-2 weeks Follow-up with oncology as scheduled  Home Health: None Equipment/Devices: None  Discharge Condition: Stable CODE STATUS: Full Diet recommendation: As tolerated  Brief/Interim Summary: Kathleen Stone is a 59 y.o. female with medical history significant of abnormal stress test, benign brain tumor, DJD of cervical spine, bilateral galactorrhea, microscopic hematuria, hyperlipidemia, hypertension, hypokalemia, nausea and emesis, history of CVA, tobacco abuse history of pancreatic cancer who was admitted last month due to obstructive jaundice - now presenting with intractable nausea and vomiting over the past few days.   Patient presented as above with intractable nausea vomiting -improved somewhat over the past 24 hours but notably hypokalemic below 3, after replacement her potassium appears to be improving, while low she appears to be chronically low on chronic potassium, this has been adjusted as below to increase to 30 unit twice daily(rather than 20 twice daily) -recommend outpatient follow-up with primary team as well as oncology in the next 1 to 2 weeks for repeat labs and follow-up in the setting of symptoms likely related to her current treatment.  She is otherwise stable and agreeable for discharge home.  Discharge Diagnoses:  Principal Problem:   Intractable nausea and vomiting Active Problems:   Essential hypertension   Hyperlipidemia   Anxiety and depression   Hypokalemia   Pancreas cancer (HCC)   Abdominal pain   Prediabetes   Hypernatremia   Intractable nausea and vomiting Abdominal pain, resolved Pancreatic cancer currently being treated with chemotherapy - Recently received  oxaliplatin , leucovorin , and Adrucil .  - Patient indicates abdominal pain and some nausea since biliary stent placement on 09/12/24 - Advance diet as tolerated, tolerating p.o. well today - IV narcotics discontinued, patient has not utilized prn IV or p.o. 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 intake versus treatment as above   Essential hypertension Continue amlodipine    Noncompliance with treatment Refusing meds/treatment per staff, educated on the importance of medication compliance  Hyperlipidemia Statin therapy was held. Follow-up with PCP as an outpatient.   Anxiety and depression Not taking mirtazapine  regularly -continue as needed   Prediabetes Currently well-controlled  Discharge Instructions  Discharge Instructions     Ambulatory Referral for Lung Cancer Scre   Complete by: As directed    Call MD for:  difficulty breathing, headache or visual disturbances   Complete by: As directed    Call MD for:  extreme fatigue   Complete by: As directed    Call MD for:  hives   Complete by: As directed    Call MD for:  persistant dizziness or light-headedness   Complete by: As directed    Call MD for:  persistant nausea and vomiting   Complete by: As directed    Call MD for:  severe uncontrolled pain   Complete by: As directed    Call MD for:  temperature >100.4   Complete by: As directed    Diet - low sodium heart healthy   Complete by: As directed    Increase activity slowly   Complete by: As directed       Allergies as of 10/13/2024  Reactions   Iohexol  Anaphylaxis, Hives, Other (See Comments)   05/25/24 - Pt given contrast for CT after 13 hr prep, had reaction and needed care in ER per MD evaluation.         Medication List     STOP taking these medications    albuterol  108  (90 Base) MCG/ACT inhaler Commonly known as: VENTOLIN  HFA   dexamethasone  4 MG tablet Commonly known as: DECADRON    lidocaine -prilocaine  cream Commonly known as: EMLA    lipase/protease/amylase 63999 UNITS Cpep capsule Commonly known as: Creon    Melatonin 5 MG Caps   mirtazapine  7.5 MG tablet Commonly known as: REMERON    nicotine  14 mg/24hr patch Commonly known as: NICODERM CQ  - dosed in mg/24 hours   ondansetron  8 MG tablet Commonly known as: ZOFRAN    oxyCODONE  5 MG immediate release tablet Commonly known as: Oxy IR/ROXICODONE    pantoprazole  40 MG tablet Commonly known as: PROTONIX        TAKE these medications    acetaminophen  325 MG tablet Commonly known as: TYLENOL  Take 2 tablets (650 mg total) by mouth every 6 (six) hours as needed for mild pain (pain score 1-3) or fever (or Fever >/= 101). What changed:  how much to take reasons to take this   amLODipine  5 MG tablet Commonly known as: NORVASC  Take 1 tablet (5 mg total) by mouth daily.   apixaban  5 MG Tabs tablet Commonly known as: ELIQUIS  Take 1 tablet (5 mg total) by mouth 2 (two) times daily.   EPINEPHrine  0.3 mg/0.3 mL Soaj injection Commonly known as: EPI-PEN Inject 0.3 mg into the muscle as needed for anaphylaxis.   loperamide 2 MG capsule Commonly known as: IMODIUM Take 2-4 mg by mouth as needed for diarrhea or loose stools. Maximum 8/day   morphine  15 MG 12 hr tablet Commonly known as: MS CONTIN  Take 1 tablet (15 mg total) by mouth every 12 (twelve) hours.   nitroGLYCERIN  0.4 MG SL tablet Commonly known as: NITROSTAT  Place 1 tablet (0.4 mg total) under the tongue every 5 (five) minutes as needed for chest pain.   omeprazole  20 MG tablet Commonly known as: PriLOSEC  OTC Take 1 tablet (20 mg total) by mouth in the morning and at bedtime.   ondansetron  4 MG disintegrating tablet Commonly known as: ZOFRAN -ODT Take 1 tablet (4 mg total) by mouth every 8 (eight) hours as needed for nausea or  vomiting.   polyethylene glycol 17 g packet Commonly known as: MIRALAX  / GLYCOLAX  Take 17 g by mouth 2 (two) times daily.   potassium chloride  SA 20 MEQ tablet Commonly known as: KLOR-CON  M Take 1.5 tablets (30 mEq total) by mouth 2 (two) times daily. What changed: how much to take   prochlorperazine  10 MG tablet Commonly known as: COMPAZINE  Take 1 tablet (10 mg total) by mouth every 6 (six) hours as needed.   Restasis 0.05 % ophthalmic emulsion Generic drug: cycloSPORINE Place 1 drop into both eyes 2 (two) times daily.   senna-docusate 8.6-50 MG tablet Commonly known as: Senokot-S Take 1 tablet by mouth 2 (two) times daily.        Allergies[1]  Consultations: None  Procedures/Studies: CT ABDOMEN PELVIS WO CONTRAST Result Date: 10/10/2024 EXAM: CT ABDOMEN AND PELVIS WITHOUT CONTRAST 10/10/2024 10:35:39 AM TECHNIQUE: CT of the abdomen and pelvis was performed without the administration of intravenous contrast. Multiplanar reformatted images are provided for review. Automated exposure control, iterative reconstruction, and/or weight-based adjustment of the mA/kV was utilized to reduce the radiation dose  to as low as reasonably achievable. COMPARISON: Comparison radiographs 09/22/2024 and 09/06/2024. CLINICAL HISTORY: Pancreatic cancer, abdominal pain over the last 4 days. * Tracking Code: BO * FINDINGS: LOWER CHEST: Stable scarring and mild nodularity at the lung bases. 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 GALLBLADDER AND BILE DUCTS: Pneumobilia with expansile biliary stent in place traversing the pancreatic head. SPLEEN: No acute abnormality. PANCREAS: Indistinctly marginated hypodense lesion of the pancreatic head and junction of the pancreatic head and body with adjacent marginal fiducial on image 24  series 2. Measurements of this pancreatic mass are not feasible due to indistinct margination on today's noncontrast examination but the appearance is roughly similar to the 09/06/2024 examination. ADRENAL GLANDS: No acute abnormality. KIDNEYS, URETERS AND BLADDER: Nonobstructive right renal calculi measure up to 0.7 cm in length. 6 nonobstructive left renal calculi measure up to 4 mm in length. No hydronephrosis. No perinephric or periureteral stranding. Urinary bladder is unremarkable. GI AND BOWEL: Stomach demonstrates no acute abnormality. There is no bowel obstruction. PERITONEUM AND RETROPERITONEUM: Low grade central mesenteric edema not substantially changed from prior. No ascites. No free air. VASCULATURE: Aorta is normal in caliber. Systemic atherosclerosis is present, including the aorta and iliac arteries. LYMPH NODES: No lymphadenopathy. REPRODUCTIVE ORGANS: Benign uterine fibroids. BONES AND SOFT TISSUES: Moderate to prominent degenerative hip arthropathy bilaterally. 3 mm grade 1 degenerative anterolisthesis at L4-L5. Lower lumbar spondylosis and degenerative disc disease. Benign calcifications in both breasts. No acute osseous abnormality. No focal soft tissue abnormality. IMPRESSION: 1. Indistinctly marginated hypodense lesion at the pancreatic head and junction of the pancreatic head and body with adjacent fiducial, subjectively similar to the prior examination. 2. Pneumobilia with expansile biliary stent traversing the pancreatic head. 3. 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 4. Low-grade central mesenteric edema, not substantially changed. 5. Nonobstructive right renal calculi up to 0.7 cm and multiple nonobstructive left renal calculi up to 4 mm. 6. Stable scarring and mild nodularity at the lung bases. 7. Systemic atherosclerosis involving the aorta and iliac arteries. 8. Uterine fibroids. 9.  Benign calcifications in both breasts. 10. Moderate to prominent degenerative hip arthropathy bilaterally. 11. Grade 1 degenerative anterolisthesis at L4-5 with lower lumbar spondylosis and degenerative disc disease. Electronically signed by: Ryan Salvage MD 10/10/2024 10:51 AM EST RP Workstation: [id number]   DG Abd 1 View Result Date: 09/22/2024 CLINICAL DATA:  Constipation. EXAM: ABDOMEN - 1 VIEW COMPARISON:  CT abdomen pelvis 02/08/2024 FINDINGS: No evidence of bowel obstruction or significant ileus. Fecal material throughout the colon without evidence of focal impaction. No small bowel dilatation. Common bile duct stent present. No abnormal calcifications. IMPRESSION: Fecal material throughout the colon without evidence of focal impaction. Electronically Signed   By: Marcey Moan M.D.   On: 09/22/2024 11:41     Subjective: No acute issues or events overnight -Notes improving nausea vomiting and otherwise denies diarrhea constipation headache fevers chills or chest pain   Discharge Exam: Vitals:   10/12/24 2141 10/13/24 0600  BP: (!) 167/79 (!) 149/63  Pulse: 66 68  Resp: 18 18  Temp: 98.6 F (37 C) 98.3 F (36.8 C)  SpO2: 98% 99%   Vitals:   10/11/24 2247 10/12/24 1403 10/12/24 2141 10/13/24 0600  BP: (!) 164/94 (!) 153/57 (!) 167/79 (!) 149/63  Pulse: 78  82 66 68  Resp: 18 16 18 18   Temp: 98.1 F (36.7 C) 98.2 F (36.8 C) 98.6 F (37 C) 98.3 F (36.8 C)  TempSrc: Oral     SpO2: 100% 100% 98% 99%  Weight:      Height:        General: Pt is alert, awake, not in acute distress Cardiovascular: RRR, S1/S2 +, no rubs, no gallops Respiratory: CTA bilaterally, no wheezing, no rhonchi Abdominal: Soft, NT, ND, bowel sounds + Extremities: no edema, no cyanosis    The results of significant diagnostics from this hospitalization (including imaging, microbiology, ancillary and laboratory) are listed below for reference.     Microbiology: No results found for this or  any previous visit (from the past 240 hours).   Labs: BNP (last 3 results) No results for input(s): BNP in the last 8760 hours. Basic Metabolic Panel: Recent Labs  Lab 10/10/24 1156 10/11/24 0430 10/12/24 0509 10/13/24 0838  NA 146* 144 140 140  K 3.0* 3.2* 2.9* 3.2*  CL 102 108 103 103  CO2 32 28 26 30   GLUCOSE 103* 85 84 114*  BUN 16 13 10 11   CREATININE 0.67 0.69 0.62 0.68  CALCIUM  10.0 9.0 8.9 9.2  MG 2.3  --   --   --   PHOS 3.0  --   --   --    Liver Function Tests: Recent Labs  Lab 10/10/24 1156 10/11/24 0430  AST 44* 41  ALT 18 17  ALKPHOS 110 88  BILITOT 1.7* 1.4*  PROT 7.5 6.1*  ALBUMIN 3.9 3.3*   Recent Labs  Lab 10/10/24 1156  LIPASE 11   No results for input(s): AMMONIA in the last 168 hours. CBC: Recent Labs  Lab 10/10/24 1156 10/11/24 0430  WBC 8.8 7.1  NEUTROABS 6.7  --   HGB 13.6 11.4*  HCT 43.6 36.0  MCV 91.4 91.6  PLT 189 131*   Cardiac Enzymes: No results for input(s): CKTOTAL, CKMB, CKMBINDEX, TROPONINI in the last 168 hours. BNP: Invalid input(s): POCBNP CBG: No results for input(s): GLUCAP in the last 168 hours. D-Dimer No results for input(s): DDIMER in the last 72 hours. Hgb A1c No results for input(s): HGBA1C in the last 72 hours. Lipid Profile No results for input(s): CHOL, HDL, LDLCALC, TRIG, CHOLHDL, LDLDIRECT in the last 72 hours. Thyroid  function studies No results for input(s): TSH, T4TOTAL, T3FREE, THYROIDAB in the last 72 hours.  Invalid input(s): FREET3 Anemia work up No results for input(s): VITAMINB12, FOLATE, FERRITIN, TIBC, IRON, RETICCTPCT in the last 72 hours. Urinalysis    Component Value Date/Time   COLORURINE YELLOW 10/10/2024 2020   APPEARANCEUR CLEAR 10/10/2024 2020   APPEARANCEUR Cloudy (A) 10/30/2019 0920   LABSPEC 1.018 10/10/2024 2020   PHURINE 6.0 10/10/2024 2020   GLUCOSEU NEGATIVE 10/10/2024 2020   GLUCOSEU NEG mg/dL 89/76/7991 7972    HGBUR NEGATIVE 10/10/2024 2020   BILIRUBINUR NEGATIVE 10/10/2024 2020   BILIRUBINUR Negative 10/30/2019 0920   KETONESUR NEGATIVE 10/10/2024 2020   PROTEINUR NEGATIVE 10/10/2024 2020   UROBILINOGEN 0.2 01/20/2013 1210   NITRITE NEGATIVE 10/10/2024 2020   LEUKOCYTESUR NEGATIVE 10/10/2024 2020   Sepsis Labs Recent Labs  Lab 10/10/24 1156 10/11/24 0430  WBC 8.8 7.1   Microbiology No results found for this or any previous visit (from the past 240 hours).   Time coordinating discharge: Over 30 minutes  SIGNED:   Elsie JAYSON Montclair, DO [facility] 10/13/2024, 11:56 AM Pager   If 7PM-7AM,  please contact night-coverage [email or web address]     [1]  Allergies Allergen Reactions   Iohexol  Anaphylaxis, Hives and Other (See Comments)    05/25/24 - Pt given contrast for CT after 13 hr prep, had reaction and needed care in ER per MD evaluation.

## 2024-10-13 NOTE — Plan of Care (Signed)

## 2024-10-19 ENCOUNTER — Telehealth: Payer: Self-pay

## 2024-10-19 ENCOUNTER — Other Ambulatory Visit: Payer: Self-pay | Admitting: Oncology

## 2024-10-19 ENCOUNTER — Telehealth

## 2024-10-19 NOTE — Telephone Encounter (Signed)
 Unable to reach patient, left a detailed voice message advising that Tuesday 10/20/24 appointments have been rescheduled to a later time. Was able to speak directly with her sister Kathleen Stone who stated that she will inform patient of new time.

## 2024-10-19 NOTE — Patient Instructions (Signed)
 [name] - I am sorry I was unable to reach you today for our scheduled appointment. I work with Harrie Bruckner, DO and am calling to support your healthcare needs. Please contact me at [phone] at your earliest convenience. I look forward to speaking with you soon.   Thank you,  Rosaline Finlay, RN MSN [place]  [facility] RN Care Manager Direct Dial: [phone]  Fax: [phone]

## 2024-10-20 ENCOUNTER — Inpatient Hospital Stay: Admitting: Nurse Practitioner

## 2024-10-20 ENCOUNTER — Inpatient Hospital Stay: Attending: Oncology

## 2024-10-20 ENCOUNTER — Encounter: Payer: Self-pay | Admitting: Nurse Practitioner

## 2024-10-20 ENCOUNTER — Inpatient Hospital Stay

## 2024-10-20 VITALS — BP 130/88 | HR 107 | Temp 98.0°F | Resp 18 | Ht 66.0 in | Wt 142.8 lb

## 2024-10-20 DIAGNOSIS — C251 Malignant neoplasm of body of pancreas: Secondary | ICD-10-CM

## 2024-10-20 DIAGNOSIS — E876 Hypokalemia: Secondary | ICD-10-CM

## 2024-10-20 LAB — CBC WITH DIFFERENTIAL (CANCER CENTER ONLY)
Abs Immature Granulocytes: 0.03 10*3/uL (ref 0.00–0.07)
Basophils Absolute: 0 10*3/uL (ref 0.0–0.1)
Basophils Relative: 0 %
Eosinophils Absolute: 0 10*3/uL (ref 0.0–0.5)
Eosinophils Relative: 1 %
HCT: 30.9 % — ABNORMAL LOW (ref 36.0–46.0)
Hemoglobin: 10 g/dL — ABNORMAL LOW (ref 12.0–15.0)
Immature Granulocytes: 1 %
Lymphocytes Relative: 26 %
Lymphs Abs: 1.5 10*3/uL (ref 0.7–4.0)
MCH: 28.1 pg (ref 26.0–34.0)
MCHC: 32.4 g/dL (ref 30.0–36.0)
MCV: 86.8 fL (ref 80.0–100.0)
Monocytes Absolute: 0.8 10*3/uL (ref 0.1–1.0)
Monocytes Relative: 14 %
Neutro Abs: 3.3 10*3/uL (ref 1.7–7.7)
Neutrophils Relative %: 58 %
Platelet Count: 250 10*3/uL (ref 150–400)
RBC: 3.56 MIL/uL — ABNORMAL LOW (ref 3.87–5.11)
RDW: 15.5 % (ref 11.5–15.5)
WBC Count: 5.6 10*3/uL (ref 4.0–10.5)
nRBC: 0 % (ref 0.0–0.2)

## 2024-10-20 LAB — CMP (CANCER CENTER ONLY)
ALT: 74 U/L — ABNORMAL HIGH (ref 0–44)
AST: 62 U/L — ABNORMAL HIGH (ref 15–41)
Albumin: 3.3 g/dL — ABNORMAL LOW (ref 3.5–5.0)
Alkaline Phosphatase: 140 U/L — ABNORMAL HIGH (ref 38–126)
Anion gap: 10 (ref 5–15)
BUN: 17 mg/dL (ref 6–20)
CO2: 31 mmol/L (ref 22–32)
Calcium: 9.7 mg/dL (ref 8.9–10.3)
Chloride: 105 mmol/L (ref 98–111)
Creatinine: 0.69 mg/dL (ref 0.44–1.00)
GFR, Estimated: >60 mL/min
Glucose, Bld: 110 mg/dL — ABNORMAL HIGH (ref 70–99)
Potassium: 2.8 mmol/L — ABNORMAL LOW (ref 3.5–5.1)
Sodium: 146 mmol/L — ABNORMAL HIGH (ref 135–145)
Total Bilirubin: 1.1 mg/dL (ref 0.0–1.2)
Total Protein: 6.8 g/dL (ref 6.5–8.1)

## 2024-10-20 MED ORDER — DEXAMETHASONE 4 MG PO TABS: 4.0000 mg | ORAL_TABLET | Freq: Every day | ORAL | 0 refills | Status: AC

## 2024-10-20 MED ORDER — DEXAMETHASONE SOD PHOSPHATE PF 10 MG/ML IJ SOLN
10.0000 mg | Freq: Once | INTRAMUSCULAR | Status: AC
  Administered 2024-10-20: 10 mg via INTRAVENOUS
  Filled 2024-10-20: qty 1

## 2024-10-20 MED ORDER — POTASSIUM CHLORIDE IN NACL 20-0.9 MEQ/L-% IV SOLN
Freq: Once | INTRAVENOUS | Status: AC
  Filled 2024-10-20: qty 1000

## 2024-10-20 MED ORDER — ONDANSETRON HCL 4 MG/2ML IJ SOLN
8.0000 mg | Freq: Once | INTRAMUSCULAR | Status: AC
  Administered 2024-10-20: 8 mg via INTRAVENOUS
  Filled 2024-10-20: qty 4

## 2024-10-20 NOTE — Progress Notes (Signed)
 Patient seen by Olam Ned NP today  Vitals are within treatment parameters:Decreased 12 pounds, pulse- 107  Labs are within treatment parameters: Hypokalemia- 2.8    Treatment plan has been signed: Yes   Per physician team, Patient will not be receiving treatment today.   Per provider- patient will receive 1 liter NS with 20 meq K, Zofran  8 mgs IV

## 2024-10-20 NOTE — Patient Instructions (Signed)
 Low Levels of Potassium in the Blood (Hypokalemia): What to Know Hypokalemia is when you have low levels of potassium in your blood. Potassium helps your muscles, nerves, and heart work like they should. It also keeps fluids and minerals balanced in your body. Your symptoms may be mild or very bad. But even minor changes to potassium levels in your blood can be life-threatening. What are the causes? Low levels of potassium in your blood may be caused by: Medicines, such as: Laxatives. These help you poop. Diuretics. These help your body get rid of extra fluid. Antibiotics. Throwing up or having watery poop (diarrhea) often. Eating disorders, such as: Anorexia. Bulimia. Kidney disease. Sweating a lot. What are the signs or symptoms? Weakness. Trouble pooping. This is called constipation. Feeling very tired. Muscle cramps. Uneven heartbeats. Tingling of your skin. Numbness of your hands or feet. Confusion. How is this diagnosed? You may be diagnosed based on your symptoms, medical history, and an exam. Your health care provider will ask what medicines you take. You may also have tests, such as: Blood tests. An electrocardiogram (ECG). How is this treated? You may need to: Take potassium supplements. Adjust your medicines. Eat more foods that have potassium in them. If your levels are very low, you may need to get potassium through an IV. You may also need to be watched in the hospital. Follow these instructions at home: Eating and drinking  If told, eat more foods that have a lot of potassium in them, such as: Nuts, like peanuts and pistachios. Seeds, like sunflower seeds and pumpkin seeds. Peas, lentils, and lima beans. Whole grains and bran cereals and breads. Fresh fruits and vegetables. Ones high in potassium include avocados, bananas, potatoes, cantaloupe, tomatoes, asparagus, and spinach. Juices, such as orange, tomato, and prune juices. Low-fat (lean) meats, like  fish. Milk and milk products, like yogurt. General instructions Take your medicines only as told. Ask your provider before taking: Vitamins. Herbs. Supplements. Keep all follow-up visits. You may need to have blood tests done often. Contact a health care provider if: You feel weaker. You throw up often and for more than 2 days. You have watery poop often and for more than 2 days. Get help right away if: You have uneven heartbeats. You have chest pain. You feel short of breath. You faint. These symptoms may be an emergency. Call 911 right away. Do not wait to see if the symptoms will go away. Do not drive yourself to the hospital. This information is not intended to replace advice given to you by your health care provider. Make sure you discuss any questions you have with your health care provider. Document Revised: 03/09/2024 Document Reviewed: 03/09/2024 Elsevier Patient Education  2025 [employer].

## 2024-10-21 ENCOUNTER — Telehealth: Payer: Self-pay

## 2024-10-21 DIAGNOSIS — Z139 Encounter for screening, unspecified: Secondary | ICD-10-CM

## 2024-10-21 LAB — CANCER ANTIGEN 19-9: CA 19-9: 964 U/mL — ABNORMAL HIGH (ref 0–35)

## 2024-10-21 NOTE — Patient Outreach (Signed)
 Call received from patient's sister Lucretia Pendley to provide patient update. Per patient's sister, patient had visit with oncology yesterday. Patient did not receive cancer treatment due to being so weak, but did receive potassium. Per patient's sister, there was discussion for including Palliative care into patient's care team. Per chart review, will reevaluate next week and make plans going forward. Patient's sister is requesting additional help in the home. Note that patient has [employer]. BSW referral placed and scheduled for initial visit to evaluate PCS eligibility.   Patient's sister verbalizes understanding that [facility] will continue with efforts to reach patient for follow-up visit.  Rosaline Finlay, RN MSN [place]  [facility] RN Care Manager Direct Dial: [phone]  Fax: [phone]

## 2024-10-22 ENCOUNTER — Inpatient Hospital Stay

## 2024-10-23 NOTE — Patient Outreach (Signed)
 Care Coordination   10/23/2024 Name: Kathleen Stone MRN: [id number] DOB: 1966-08-27   Care Coordination Outreach Attempts:  A second unsuccessful outreach was attempted today to complete CCM follow-up visit.  Follow Up Plan:  Additional outreach attempts will be made to complete follow-up visit.  Encounter Outcome:  No Answer. HIPAA compliant voicemail left requesting return call.   Rosaline Finlay, RN MSN [place]  [facility] RN Care Manager Direct Dial: [phone]  Fax: [phone]

## 2024-10-26 ENCOUNTER — Inpatient Hospital Stay

## 2024-10-26 ENCOUNTER — Inpatient Hospital Stay: Admitting: Nurse Practitioner

## 2024-10-28 ENCOUNTER — Inpatient Hospital Stay

## 2024-10-30 ENCOUNTER — Telehealth

## 2024-11-03 ENCOUNTER — Inpatient Hospital Stay

## 2024-11-03 ENCOUNTER — Inpatient Hospital Stay: Admitting: Nurse Practitioner

## 2024-11-05 ENCOUNTER — Inpatient Hospital Stay

## 2024-11-09 ENCOUNTER — Inpatient Hospital Stay: Admitting: Nurse Practitioner

## 2024-11-09 ENCOUNTER — Inpatient Hospital Stay

## 2024-11-10 ENCOUNTER — Inpatient Hospital Stay

## 2024-11-12 ENCOUNTER — Inpatient Hospital Stay

## 2024-11-17 ENCOUNTER — Inpatient Hospital Stay

## 2024-11-17 ENCOUNTER — Inpatient Hospital Stay: Admitting: Oncology

## 2024-11-19 ENCOUNTER — Inpatient Hospital Stay

## 2024-11-24 ENCOUNTER — Inpatient Hospital Stay

## 2024-11-24 ENCOUNTER — Inpatient Hospital Stay: Admitting: Oncology

## 2024-11-26 ENCOUNTER — Inpatient Hospital Stay
